# Patient Record
Sex: Female | Born: 1940
Health system: Southern US, Community
[De-identification: ages and names within clinical notes are randomized; demographics above are authoritative.]

## PROBLEM LIST (undated history)

## (undated) DIAGNOSIS — R42 Dizziness and giddiness: Secondary | ICD-10-CM

## (undated) DIAGNOSIS — Z6740 Type O blood, Rh positive: Secondary | ICD-10-CM

## (undated) DIAGNOSIS — Z87442 Personal history of urinary calculi: Secondary | ICD-10-CM

## (undated) DIAGNOSIS — K635 Polyp of colon: Secondary | ICD-10-CM

## (undated) DIAGNOSIS — K589 Irritable bowel syndrome without diarrhea: Secondary | ICD-10-CM

## (undated) DIAGNOSIS — S161XXA Strain of muscle, fascia and tendon at neck level, initial encounter: Secondary | ICD-10-CM

## (undated) DIAGNOSIS — Z889 Allergy status to unspecified drugs, medicaments and biological substances status: Secondary | ICD-10-CM

## (undated) DIAGNOSIS — F0781 Postconcussional syndrome: Secondary | ICD-10-CM

## (undated) DIAGNOSIS — F419 Anxiety disorder, unspecified: Secondary | ICD-10-CM

## (undated) DIAGNOSIS — M797 Fibromyalgia: Secondary | ICD-10-CM

## (undated) DIAGNOSIS — K219 Gastro-esophageal reflux disease without esophagitis: Secondary | ICD-10-CM

## (undated) DIAGNOSIS — M1990 Unspecified osteoarthritis, unspecified site: Secondary | ICD-10-CM

## (undated) DIAGNOSIS — I8500 Esophageal varices without bleeding: Secondary | ICD-10-CM

## (undated) DIAGNOSIS — E7800 Pure hypercholesterolemia, unspecified: Secondary | ICD-10-CM

## (undated) DIAGNOSIS — I10 Essential (primary) hypertension: Secondary | ICD-10-CM

## (undated) DIAGNOSIS — K5730 Diverticulosis of large intestine without perforation or abscess without bleeding: Secondary | ICD-10-CM

## (undated) DIAGNOSIS — K7460 Unspecified cirrhosis of liver: Secondary | ICD-10-CM

## (undated) HISTORY — DX: Diverticulosis of large intestine without perforation or abscess without bleeding: K57.30

## (undated) HISTORY — DX: Pure hypercholesterolemia, unspecified: E78.00

## (undated) HISTORY — DX: Type O blood, Rh positive: Z67.40

## (undated) HISTORY — DX: Essential (primary) hypertension: I10

## (undated) HISTORY — PX: KNEE SURGERY: SHX244

## (undated) HISTORY — PX: THYROID SURGERY: SHX805

## (undated) HISTORY — DX: Polyp of colon: K63.5

## (undated) HISTORY — DX: Fibromyalgia: M79.7

## (undated) HISTORY — DX: Gastro-esophageal reflux disease without esophagitis: K21.9

## (undated) HISTORY — DX: Irritable bowel syndrome, unspecified: K58.9

## (undated) HISTORY — DX: Unspecified cirrhosis of liver: K74.60

## (undated) HISTORY — DX: Unspecified osteoarthritis, unspecified site: M19.90

## (undated) HISTORY — DX: Esophageal varices without bleeding: I85.00

## (undated) HISTORY — DX: Dizziness and giddiness: R42

## (undated) HISTORY — DX: Allergy status to unspecified drugs, medicaments and biological substances: Z88.9

## (undated) HISTORY — DX: Personal history of urinary calculi: Z87.442

## (undated) HISTORY — DX: Postconcussional syndrome: F07.81

## (undated) HISTORY — DX: Anxiety disorder, unspecified: F41.9

## (undated) HISTORY — DX: Strain of muscle, fascia and tendon at neck level, initial encounter: S16.1XXA

---

## 1998-01-25 ENCOUNTER — Ambulatory Visit (HOSPITAL_COMMUNITY): Admission: RE | Admit: 1998-01-25 | Discharge: 1998-01-25 | Payer: Self-pay | Admitting: Pulmonary Disease

## 1998-01-25 ENCOUNTER — Encounter: Payer: Self-pay | Admitting: Pulmonary Disease

## 1998-02-03 ENCOUNTER — Encounter: Payer: Self-pay | Admitting: Orthopedic Surgery

## 1998-02-03 ENCOUNTER — Ambulatory Visit (HOSPITAL_COMMUNITY): Admission: RE | Admit: 1998-02-03 | Discharge: 1998-02-03 | Payer: Self-pay | Admitting: Orthopedic Surgery

## 1998-05-15 ENCOUNTER — Encounter: Admission: RE | Admit: 1998-05-15 | Discharge: 1998-08-13 | Payer: Self-pay | Admitting: Orthopedic Surgery

## 1999-03-03 ENCOUNTER — Emergency Department (HOSPITAL_COMMUNITY): Admission: EM | Admit: 1999-03-03 | Discharge: 1999-03-03 | Payer: Self-pay | Admitting: Emergency Medicine

## 1999-04-20 ENCOUNTER — Ambulatory Visit (HOSPITAL_COMMUNITY): Admission: RE | Admit: 1999-04-20 | Discharge: 1999-04-20 | Payer: Self-pay | Admitting: Gastroenterology

## 1999-04-20 ENCOUNTER — Encounter (INDEPENDENT_AMBULATORY_CARE_PROVIDER_SITE_OTHER): Payer: Self-pay | Admitting: *Deleted

## 1999-04-20 ENCOUNTER — Encounter: Payer: Self-pay | Admitting: Gastroenterology

## 2000-01-10 ENCOUNTER — Emergency Department (HOSPITAL_COMMUNITY): Admission: EM | Admit: 2000-01-10 | Discharge: 2000-01-10 | Payer: Self-pay | Admitting: Emergency Medicine

## 2000-01-10 ENCOUNTER — Encounter: Payer: Self-pay | Admitting: Emergency Medicine

## 2000-03-06 ENCOUNTER — Encounter: Admission: RE | Admit: 2000-03-06 | Discharge: 2000-06-04 | Payer: Self-pay | Admitting: Anesthesiology

## 2000-03-11 ENCOUNTER — Encounter: Payer: Self-pay | Admitting: Pulmonary Disease

## 2000-03-11 ENCOUNTER — Ambulatory Visit (HOSPITAL_COMMUNITY): Admission: RE | Admit: 2000-03-11 | Discharge: 2000-03-11 | Payer: Self-pay | Admitting: Pulmonary Disease

## 2001-05-10 ENCOUNTER — Emergency Department (HOSPITAL_COMMUNITY): Admission: EM | Admit: 2001-05-10 | Discharge: 2001-05-10 | Payer: Self-pay

## 2001-12-08 ENCOUNTER — Encounter: Payer: Self-pay | Admitting: Obstetrics and Gynecology

## 2001-12-08 ENCOUNTER — Ambulatory Visit (HOSPITAL_COMMUNITY): Admission: RE | Admit: 2001-12-08 | Discharge: 2001-12-08 | Payer: Self-pay | Admitting: Obstetrics and Gynecology

## 2002-05-13 HISTORY — PX: CHOLECYSTECTOMY, LAPAROSCOPIC: SHX56

## 2002-06-03 ENCOUNTER — Encounter: Admission: RE | Admit: 2002-06-03 | Discharge: 2002-06-03 | Payer: Self-pay | Admitting: Rheumatology

## 2002-10-02 ENCOUNTER — Emergency Department (HOSPITAL_COMMUNITY): Admission: EM | Admit: 2002-10-02 | Discharge: 2002-10-02 | Payer: Self-pay | Admitting: Emergency Medicine

## 2002-12-26 ENCOUNTER — Emergency Department (HOSPITAL_COMMUNITY): Admission: EM | Admit: 2002-12-26 | Discharge: 2002-12-26 | Payer: Self-pay | Admitting: Emergency Medicine

## 2003-05-02 ENCOUNTER — Inpatient Hospital Stay (HOSPITAL_COMMUNITY): Admission: EM | Admit: 2003-05-02 | Discharge: 2003-05-06 | Payer: Self-pay | Admitting: Emergency Medicine

## 2003-05-04 ENCOUNTER — Encounter: Payer: Self-pay | Admitting: Gastroenterology

## 2003-05-05 ENCOUNTER — Encounter (INDEPENDENT_AMBULATORY_CARE_PROVIDER_SITE_OTHER): Payer: Self-pay | Admitting: *Deleted

## 2003-05-05 ENCOUNTER — Encounter (INDEPENDENT_AMBULATORY_CARE_PROVIDER_SITE_OTHER): Payer: Self-pay | Admitting: Specialist

## 2003-07-16 ENCOUNTER — Emergency Department (HOSPITAL_COMMUNITY): Admission: EM | Admit: 2003-07-16 | Discharge: 2003-07-16 | Payer: Self-pay | Admitting: Emergency Medicine

## 2003-08-10 ENCOUNTER — Ambulatory Visit (HOSPITAL_COMMUNITY): Admission: RE | Admit: 2003-08-10 | Discharge: 2003-08-10 | Payer: Self-pay | Admitting: Obstetrics and Gynecology

## 2003-08-15 ENCOUNTER — Encounter: Admission: RE | Admit: 2003-08-15 | Discharge: 2003-08-15 | Payer: Self-pay | Admitting: Obstetrics and Gynecology

## 2004-03-20 ENCOUNTER — Ambulatory Visit: Payer: Self-pay | Admitting: Pulmonary Disease

## 2004-03-29 ENCOUNTER — Ambulatory Visit: Payer: Self-pay | Admitting: Pulmonary Disease

## 2004-04-15 ENCOUNTER — Emergency Department (HOSPITAL_COMMUNITY): Admission: EM | Admit: 2004-04-15 | Discharge: 2004-04-15 | Payer: Self-pay | Admitting: Family Medicine

## 2004-07-06 ENCOUNTER — Ambulatory Visit: Payer: Self-pay | Admitting: Pulmonary Disease

## 2004-08-07 ENCOUNTER — Ambulatory Visit: Payer: Self-pay | Admitting: Pulmonary Disease

## 2004-08-14 ENCOUNTER — Ambulatory Visit (HOSPITAL_COMMUNITY): Admission: RE | Admit: 2004-08-14 | Discharge: 2004-08-14 | Payer: Self-pay | Admitting: Pulmonary Disease

## 2004-11-16 ENCOUNTER — Ambulatory Visit: Payer: Self-pay | Admitting: Pulmonary Disease

## 2005-01-03 ENCOUNTER — Emergency Department (HOSPITAL_COMMUNITY): Admission: EM | Admit: 2005-01-03 | Discharge: 2005-01-03 | Payer: Self-pay | Admitting: Family Medicine

## 2005-01-17 ENCOUNTER — Ambulatory Visit: Payer: Self-pay | Admitting: Gastroenterology

## 2005-01-21 ENCOUNTER — Ambulatory Visit: Payer: Self-pay | Admitting: Gastroenterology

## 2005-01-30 ENCOUNTER — Ambulatory Visit: Payer: Self-pay | Admitting: Gastroenterology

## 2005-01-30 ENCOUNTER — Encounter (INDEPENDENT_AMBULATORY_CARE_PROVIDER_SITE_OTHER): Payer: Self-pay | Admitting: *Deleted

## 2005-02-06 ENCOUNTER — Ambulatory Visit: Payer: Self-pay | Admitting: Gastroenterology

## 2005-02-20 ENCOUNTER — Ambulatory Visit: Payer: Self-pay | Admitting: Gastroenterology

## 2005-02-22 ENCOUNTER — Ambulatory Visit: Payer: Self-pay | Admitting: Internal Medicine

## 2005-03-11 ENCOUNTER — Ambulatory Visit: Payer: Self-pay | Admitting: Gastroenterology

## 2005-07-03 ENCOUNTER — Ambulatory Visit: Payer: Self-pay | Admitting: Pulmonary Disease

## 2005-08-07 ENCOUNTER — Ambulatory Visit: Payer: Self-pay | Admitting: Pulmonary Disease

## 2005-08-22 ENCOUNTER — Encounter: Admission: RE | Admit: 2005-08-22 | Discharge: 2005-08-22 | Payer: Self-pay | Admitting: Pulmonary Disease

## 2005-08-27 ENCOUNTER — Encounter: Admission: RE | Admit: 2005-08-27 | Discharge: 2005-08-27 | Payer: Self-pay | Admitting: Pulmonary Disease

## 2006-03-11 ENCOUNTER — Ambulatory Visit: Payer: Self-pay | Admitting: Pulmonary Disease

## 2006-03-18 ENCOUNTER — Ambulatory Visit: Payer: Self-pay | Admitting: Gastroenterology

## 2006-05-19 ENCOUNTER — Ambulatory Visit: Payer: Self-pay | Admitting: Pulmonary Disease

## 2006-06-02 ENCOUNTER — Ambulatory Visit: Payer: Self-pay | Admitting: Pulmonary Disease

## 2006-06-02 LAB — CONVERTED CEMR LAB
Cholesterol: 220 mg/dL (ref 0–200)
Direct LDL: 122 mg/dL
HDL: 43.4 mg/dL (ref >39.0)
Total CHOL/HDL Ratio: 5.1
Triglycerides: 279 mg/dL (ref 0–149)
VLDL: 56 mg/dL — ABNORMAL HIGH (ref 0–40)

## 2006-07-04 ENCOUNTER — Ambulatory Visit: Payer: Self-pay | Admitting: Pulmonary Disease

## 2006-07-24 ENCOUNTER — Ambulatory Visit: Payer: Self-pay | Admitting: Pulmonary Disease

## 2006-07-24 LAB — CONVERTED CEMR LAB: TSH: 1.53 microintl units/mL (ref 0.35–5.50)

## 2006-09-16 ENCOUNTER — Ambulatory Visit (HOSPITAL_COMMUNITY): Admission: RE | Admit: 2006-09-16 | Discharge: 2006-09-16 | Payer: Self-pay | Admitting: Pulmonary Disease

## 2007-01-08 ENCOUNTER — Ambulatory Visit: Payer: Self-pay | Admitting: Pulmonary Disease

## 2007-04-21 ENCOUNTER — Emergency Department (HOSPITAL_COMMUNITY): Admission: EM | Admit: 2007-04-21 | Discharge: 2007-04-21 | Payer: Self-pay | Admitting: Emergency Medicine

## 2007-04-24 ENCOUNTER — Telehealth (INDEPENDENT_AMBULATORY_CARE_PROVIDER_SITE_OTHER): Payer: Self-pay | Admitting: *Deleted

## 2007-04-28 ENCOUNTER — Ambulatory Visit: Payer: Self-pay | Admitting: Pulmonary Disease

## 2007-04-28 DIAGNOSIS — R42 Dizziness and giddiness: Secondary | ICD-10-CM | POA: Insufficient documentation

## 2007-04-29 ENCOUNTER — Ambulatory Visit: Payer: Self-pay | Admitting: Cardiovascular Disease

## 2007-04-30 ENCOUNTER — Telehealth: Payer: Self-pay | Admitting: Adult Health

## 2007-04-30 LAB — CONVERTED CEMR LAB
BUN: 23 mg/dL (ref 6–23)
Basophils Absolute: 0 10*3/uL (ref 0.0–0.1)
Basophils Relative: 0.4 % (ref 0.0–1.0)
CO2: 30 meq/L (ref 19–32)
Calcium: 9.4 mg/dL (ref 8.4–10.5)
Chloride: 104 meq/L (ref 96–112)
Creatinine, Ser: 1 mg/dL (ref 0.4–1.2)
Eosinophils Absolute: 0.1 10*3/uL (ref 0.0–0.6)
Eosinophils Relative: 1.8 % (ref 0.0–5.0)
GFR calc Af Amer: 71 mL/min
GFR calc non Af Amer: 59 mL/min
Glucose, Bld: 115 mg/dL — ABNORMAL HIGH (ref 70–99)
HCT: 41.8 % (ref 36.0–46.0)
Hemoglobin: 14.4 g/dL (ref 12.0–15.0)
Lymphocytes Relative: 44.8 % (ref 12.0–46.0)
MCHC: 34.4 g/dL (ref 30.0–36.0)
MCV: 85.9 fL (ref 78.0–100.0)
Monocytes Absolute: 0.7 10*3/uL (ref 0.2–0.7)
Monocytes Relative: 8 % (ref 3.0–11.0)
Neutro Abs: 3.7 10*3/uL (ref 1.4–7.7)
Neutrophils Relative %: 45 % (ref 43.0–77.0)
Platelets: 321 10*3/uL (ref 150–400)
Potassium: 4.5 meq/L (ref 3.5–5.1)
RBC: 4.86 M/uL (ref 3.87–5.11)
RDW: 12.1 % (ref 11.5–14.6)
Sodium: 141 meq/L (ref 135–145)
TSH: 1.28 microintl units/mL (ref 0.35–5.50)
WBC: 8.2 10*3/uL (ref 4.5–10.5)

## 2007-05-04 DIAGNOSIS — F411 Generalized anxiety disorder: Secondary | ICD-10-CM | POA: Insufficient documentation

## 2007-05-04 DIAGNOSIS — M1990 Unspecified osteoarthritis, unspecified site: Secondary | ICD-10-CM | POA: Insufficient documentation

## 2007-05-04 DIAGNOSIS — I10 Essential (primary) hypertension: Secondary | ICD-10-CM | POA: Insufficient documentation

## 2007-05-04 DIAGNOSIS — J45909 Unspecified asthma, uncomplicated: Secondary | ICD-10-CM | POA: Insufficient documentation

## 2007-05-05 ENCOUNTER — Ambulatory Visit: Payer: Self-pay | Admitting: Pulmonary Disease

## 2007-05-05 DIAGNOSIS — S139XXA Sprain of joints and ligaments of unspecified parts of neck, initial encounter: Secondary | ICD-10-CM | POA: Insufficient documentation

## 2007-05-28 ENCOUNTER — Telehealth: Payer: Self-pay | Admitting: Pulmonary Disease

## 2007-06-10 ENCOUNTER — Ambulatory Visit: Payer: Self-pay | Admitting: Pulmonary Disease

## 2007-06-13 DIAGNOSIS — K589 Irritable bowel syndrome without diarrhea: Secondary | ICD-10-CM | POA: Insufficient documentation

## 2007-06-13 DIAGNOSIS — J3089 Other allergic rhinitis: Secondary | ICD-10-CM

## 2007-06-13 DIAGNOSIS — Z889 Allergy status to unspecified drugs, medicaments and biological substances status: Secondary | ICD-10-CM | POA: Insufficient documentation

## 2007-06-13 DIAGNOSIS — J302 Other seasonal allergic rhinitis: Secondary | ICD-10-CM | POA: Insufficient documentation

## 2007-06-13 DIAGNOSIS — K219 Gastro-esophageal reflux disease without esophagitis: Secondary | ICD-10-CM | POA: Insufficient documentation

## 2007-06-13 DIAGNOSIS — IMO0001 Reserved for inherently not codable concepts without codable children: Secondary | ICD-10-CM | POA: Insufficient documentation

## 2007-07-06 ENCOUNTER — Encounter: Payer: Self-pay | Admitting: Pulmonary Disease

## 2007-07-10 ENCOUNTER — Ambulatory Visit: Payer: Self-pay | Admitting: Pulmonary Disease

## 2007-07-10 DIAGNOSIS — F0781 Postconcussional syndrome: Secondary | ICD-10-CM | POA: Insufficient documentation

## 2007-07-15 ENCOUNTER — Telehealth (INDEPENDENT_AMBULATORY_CARE_PROVIDER_SITE_OTHER): Payer: Self-pay | Admitting: *Deleted

## 2007-08-25 ENCOUNTER — Telehealth (INDEPENDENT_AMBULATORY_CARE_PROVIDER_SITE_OTHER): Payer: Self-pay | Admitting: *Deleted

## 2007-09-16 ENCOUNTER — Ambulatory Visit (HOSPITAL_COMMUNITY): Admission: RE | Admit: 2007-09-16 | Discharge: 2007-09-16 | Payer: Self-pay | Admitting: Pulmonary Disease

## 2007-10-22 ENCOUNTER — Ambulatory Visit: Payer: Self-pay | Admitting: Pulmonary Disease

## 2007-10-22 DIAGNOSIS — K5730 Diverticulosis of large intestine without perforation or abscess without bleeding: Secondary | ICD-10-CM | POA: Insufficient documentation

## 2007-10-22 DIAGNOSIS — D126 Benign neoplasm of colon, unspecified: Secondary | ICD-10-CM | POA: Insufficient documentation

## 2007-10-30 ENCOUNTER — Ambulatory Visit: Payer: Self-pay | Admitting: Pulmonary Disease

## 2007-10-30 DIAGNOSIS — J069 Acute upper respiratory infection, unspecified: Secondary | ICD-10-CM | POA: Insufficient documentation

## 2007-11-20 ENCOUNTER — Telehealth: Payer: Self-pay | Admitting: Pulmonary Disease

## 2007-12-28 ENCOUNTER — Telehealth (INDEPENDENT_AMBULATORY_CARE_PROVIDER_SITE_OTHER): Payer: Self-pay | Admitting: *Deleted

## 2007-12-29 ENCOUNTER — Ambulatory Visit: Payer: Self-pay | Admitting: Internal Medicine

## 2008-01-28 ENCOUNTER — Telehealth (INDEPENDENT_AMBULATORY_CARE_PROVIDER_SITE_OTHER): Payer: Self-pay | Admitting: *Deleted

## 2008-02-03 ENCOUNTER — Telehealth (INDEPENDENT_AMBULATORY_CARE_PROVIDER_SITE_OTHER): Payer: Self-pay | Admitting: *Deleted

## 2008-03-18 ENCOUNTER — Telehealth (INDEPENDENT_AMBULATORY_CARE_PROVIDER_SITE_OTHER): Payer: Self-pay | Admitting: *Deleted

## 2008-04-19 ENCOUNTER — Ambulatory Visit: Payer: Self-pay | Admitting: Pulmonary Disease

## 2008-05-13 DIAGNOSIS — K635 Polyp of colon: Secondary | ICD-10-CM

## 2008-05-13 HISTORY — DX: Polyp of colon: K63.5

## 2008-07-11 ENCOUNTER — Ambulatory Visit: Payer: Self-pay | Admitting: Gastroenterology

## 2008-07-11 DIAGNOSIS — R197 Diarrhea, unspecified: Secondary | ICD-10-CM | POA: Insufficient documentation

## 2008-07-18 ENCOUNTER — Telehealth: Payer: Self-pay | Admitting: Gastroenterology

## 2008-07-18 ENCOUNTER — Encounter: Payer: Self-pay | Admitting: Gastroenterology

## 2008-07-25 ENCOUNTER — Telehealth: Payer: Self-pay | Admitting: Gastroenterology

## 2008-07-28 ENCOUNTER — Encounter: Payer: Self-pay | Admitting: Pulmonary Disease

## 2008-07-28 ENCOUNTER — Encounter: Payer: Self-pay | Admitting: Gastroenterology

## 2008-07-28 ENCOUNTER — Ambulatory Visit: Payer: Self-pay | Admitting: Gastroenterology

## 2008-07-29 ENCOUNTER — Telehealth: Payer: Self-pay | Admitting: Gastroenterology

## 2008-08-02 ENCOUNTER — Encounter: Payer: Self-pay | Admitting: Gastroenterology

## 2008-08-16 ENCOUNTER — Ambulatory Visit: Payer: Self-pay | Admitting: Gastroenterology

## 2008-08-24 ENCOUNTER — Emergency Department (HOSPITAL_COMMUNITY): Admission: EM | Admit: 2008-08-24 | Discharge: 2008-08-25 | Payer: Self-pay | Admitting: Emergency Medicine

## 2008-08-25 ENCOUNTER — Emergency Department (HOSPITAL_COMMUNITY): Admission: EM | Admit: 2008-08-25 | Discharge: 2008-08-25 | Payer: Self-pay | Admitting: Emergency Medicine

## 2008-09-16 ENCOUNTER — Encounter: Admission: RE | Admit: 2008-09-16 | Discharge: 2008-09-16 | Payer: Self-pay | Admitting: Pulmonary Disease

## 2008-10-25 ENCOUNTER — Ambulatory Visit: Payer: Self-pay | Admitting: Pulmonary Disease

## 2008-10-30 DIAGNOSIS — E559 Vitamin D deficiency, unspecified: Secondary | ICD-10-CM | POA: Insufficient documentation

## 2008-10-30 LAB — CONVERTED CEMR LAB
ALT: 22 units/L (ref 0–35)
AST: 28 units/L (ref 0–37)
Albumin: 4.1 g/dL (ref 3.5–5.2)
Alkaline Phosphatase: 85 units/L (ref 39–117)
BUN: 23 mg/dL (ref 6–23)
Basophils Absolute: 0.2 10*3/uL — ABNORMAL HIGH (ref 0.0–0.1)
Basophils Relative: 2.4 % (ref 0.0–3.0)
Bilirubin, Direct: 0.1 mg/dL (ref 0.0–0.3)
CO2: 29 meq/L (ref 19–32)
Calcium: 8.8 mg/dL (ref 8.4–10.5)
Chloride: 112 meq/L (ref 96–112)
Cholesterol: 204 mg/dL — ABNORMAL HIGH (ref 0–200)
Creatinine, Ser: 0.8 mg/dL (ref 0.4–1.2)
Direct LDL: 123.3 mg/dL
Eosinophils Absolute: 0.1 10*3/uL (ref 0.0–0.7)
Eosinophils Relative: 1.8 % (ref 0.0–5.0)
GFR calc non Af Amer: 75.34 mL/min (ref >60)
Glucose, Bld: 102 mg/dL — ABNORMAL HIGH (ref 70–99)
HCT: 42.3 % (ref 36.0–46.0)
HDL: 42.8 mg/dL (ref >39.00)
Hemoglobin: 14.7 g/dL (ref 12.0–15.0)
Lymphocytes Relative: 46.6 % — ABNORMAL HIGH (ref 12.0–46.0)
Lymphs Abs: 3.3 10*3/uL (ref 0.7–4.0)
MCHC: 34.7 g/dL (ref 30.0–36.0)
MCV: 85 fL (ref 78.0–100.0)
Monocytes Absolute: 0.6 10*3/uL (ref 0.1–1.0)
Monocytes Relative: 8.6 % (ref 3.0–12.0)
Neutro Abs: 2.9 10*3/uL (ref 1.4–7.7)
Neutrophils Relative %: 40.6 % — ABNORMAL LOW (ref 43.0–77.0)
Platelets: 271 10*3/uL (ref 150.0–400.0)
Potassium: 4.1 meq/L (ref 3.5–5.1)
RBC: 4.98 M/uL (ref 3.87–5.11)
RDW: 12.2 % (ref 11.5–14.6)
Sodium: 141 meq/L (ref 135–145)
TSH: 1.68 microintl units/mL (ref 0.35–5.50)
Total Bilirubin: 0.9 mg/dL (ref 0.3–1.2)
Total CHOL/HDL Ratio: 5
Total Protein: 7.2 g/dL (ref 6.0–8.3)
Triglycerides: 167 mg/dL — ABNORMAL HIGH (ref 0.0–149.0)
Uric Acid, Serum: 5.8 mg/dL (ref 2.4–7.0)
VLDL: 33.4 mg/dL (ref 0.0–40.0)
Vit D, 25-Hydroxy: 21 ng/mL — ABNORMAL LOW (ref 30–89)
WBC: 7.1 10*3/uL (ref 4.5–10.5)

## 2009-01-11 ENCOUNTER — Ambulatory Visit: Payer: Self-pay | Admitting: Internal Medicine

## 2009-02-27 ENCOUNTER — Telehealth (INDEPENDENT_AMBULATORY_CARE_PROVIDER_SITE_OTHER): Payer: Self-pay | Admitting: *Deleted

## 2009-03-10 ENCOUNTER — Telehealth (INDEPENDENT_AMBULATORY_CARE_PROVIDER_SITE_OTHER): Payer: Self-pay | Admitting: *Deleted

## 2009-04-04 ENCOUNTER — Emergency Department (HOSPITAL_COMMUNITY): Admission: EM | Admit: 2009-04-04 | Discharge: 2009-04-04 | Payer: Self-pay | Admitting: Emergency Medicine

## 2009-04-25 ENCOUNTER — Ambulatory Visit: Payer: Self-pay | Admitting: Pulmonary Disease

## 2009-04-26 DIAGNOSIS — N200 Calculus of kidney: Secondary | ICD-10-CM | POA: Insufficient documentation

## 2009-07-17 ENCOUNTER — Telehealth (INDEPENDENT_AMBULATORY_CARE_PROVIDER_SITE_OTHER): Payer: Self-pay | Admitting: *Deleted

## 2009-07-19 ENCOUNTER — Ambulatory Visit: Payer: Self-pay | Admitting: Pulmonary Disease

## 2009-07-23 LAB — CONVERTED CEMR LAB
ALT: 22 units/L (ref 0–35)
AST: 25 units/L (ref 0–37)
Albumin: 4.2 g/dL (ref 3.5–5.2)
Alkaline Phosphatase: 77 units/L (ref 39–117)
BUN: 21 mg/dL (ref 6–23)
Basophils Absolute: 0.1 10*3/uL (ref 0.0–0.1)
Basophils Relative: 0.8 % (ref 0.0–3.0)
Bilirubin, Direct: 0.2 mg/dL (ref 0.0–0.3)
CO2: 28 meq/L (ref 19–32)
Calcium: 9.4 mg/dL (ref 8.4–10.5)
Chloride: 106 meq/L (ref 96–112)
Creatinine, Ser: 0.7 mg/dL (ref 0.4–1.2)
Eosinophils Absolute: 0.1 10*3/uL (ref 0.0–0.7)
Eosinophils Relative: 1.3 % (ref 0.0–5.0)
GFR calc non Af Amer: 87.71 mL/min (ref >60)
Glucose, Bld: 113 mg/dL — ABNORMAL HIGH (ref 70–99)
HCT: 43.4 % (ref 36.0–46.0)
Hemoglobin: 14.3 g/dL (ref 12.0–15.0)
Lymphocytes Relative: 42.3 % (ref 12.0–46.0)
Lymphs Abs: 3.2 10*3/uL (ref 0.7–4.0)
MCHC: 33 g/dL (ref 30.0–36.0)
MCV: 86.8 fL (ref 78.0–100.0)
Monocytes Absolute: 0.6 10*3/uL (ref 0.1–1.0)
Monocytes Relative: 7.9 % (ref 3.0–12.0)
Neutro Abs: 3.6 10*3/uL (ref 1.4–7.7)
Neutrophils Relative %: 47.7 % (ref 43.0–77.0)
Platelets: 320 10*3/uL (ref 150.0–400.0)
Potassium: 4 meq/L (ref 3.5–5.1)
RBC: 5 M/uL (ref 3.87–5.11)
RDW: 12.1 % (ref 11.5–14.6)
Sed Rate: 14 mm/hr (ref 0–22)
Sodium: 141 meq/L (ref 135–145)
TSH: 1.91 microintl units/mL (ref 0.35–5.50)
Total Bilirubin: 1 mg/dL (ref 0.3–1.2)
Total Protein: 7.6 g/dL (ref 6.0–8.3)
Vit D, 25-Hydroxy: 35 ng/mL (ref 30–89)
WBC: 7.6 10*3/uL (ref 4.5–10.5)

## 2009-09-29 ENCOUNTER — Encounter: Admission: RE | Admit: 2009-09-29 | Discharge: 2009-09-29 | Payer: Self-pay | Admitting: Pulmonary Disease

## 2009-10-24 ENCOUNTER — Ambulatory Visit: Payer: Self-pay | Admitting: Pulmonary Disease

## 2009-10-25 LAB — CONVERTED CEMR LAB
Cholesterol: 221 mg/dL — ABNORMAL HIGH (ref 0–200)
Direct LDL: 115.7 mg/dL
HDL: 40.3 mg/dL (ref >39.00)
Total CHOL/HDL Ratio: 5
Triglycerides: 385 mg/dL — ABNORMAL HIGH (ref 0.0–149.0)
VLDL: 77 mg/dL — ABNORMAL HIGH (ref 0.0–40.0)

## 2010-01-10 ENCOUNTER — Emergency Department (HOSPITAL_COMMUNITY): Admission: EM | Admit: 2010-01-10 | Discharge: 2010-01-10 | Payer: Self-pay | Admitting: Family Medicine

## 2010-01-10 ENCOUNTER — Telehealth (INDEPENDENT_AMBULATORY_CARE_PROVIDER_SITE_OTHER): Payer: Self-pay | Admitting: *Deleted

## 2010-01-23 ENCOUNTER — Telehealth: Payer: Self-pay | Admitting: Pulmonary Disease

## 2010-01-23 ENCOUNTER — Telehealth (INDEPENDENT_AMBULATORY_CARE_PROVIDER_SITE_OTHER): Payer: Self-pay | Admitting: *Deleted

## 2010-02-01 ENCOUNTER — Telehealth (INDEPENDENT_AMBULATORY_CARE_PROVIDER_SITE_OTHER): Payer: Self-pay | Admitting: *Deleted

## 2010-02-06 ENCOUNTER — Telehealth: Payer: Self-pay | Admitting: Pulmonary Disease

## 2010-02-07 ENCOUNTER — Telehealth: Payer: Self-pay | Admitting: Pulmonary Disease

## 2010-02-07 ENCOUNTER — Encounter: Payer: Self-pay | Admitting: Pulmonary Disease

## 2010-02-12 ENCOUNTER — Ambulatory Visit: Payer: Self-pay | Admitting: Pulmonary Disease

## 2010-02-16 ENCOUNTER — Telehealth: Payer: Self-pay | Admitting: Pulmonary Disease

## 2010-03-19 ENCOUNTER — Telehealth (INDEPENDENT_AMBULATORY_CARE_PROVIDER_SITE_OTHER): Payer: Self-pay | Admitting: *Deleted

## 2010-03-26 ENCOUNTER — Telehealth: Payer: Self-pay | Admitting: Pulmonary Disease

## 2010-04-24 ENCOUNTER — Ambulatory Visit: Payer: Self-pay | Admitting: Pulmonary Disease

## 2010-04-24 DIAGNOSIS — R079 Chest pain, unspecified: Secondary | ICD-10-CM | POA: Insufficient documentation

## 2010-06-03 ENCOUNTER — Encounter: Payer: Self-pay | Admitting: Obstetrics and Gynecology

## 2010-06-12 NOTE — Progress Notes (Signed)
Summary: medicinie question/cb  Phone Note Call from Patient Call back at Home Phone [phone]   Caller: Patient Call For: nadel Summary of Call: calling with more info on medicinie assistance pt got approval letter but says needs more info Initial call taken by: Don Broach,  March 26, 2010 12:07 PM  Follow-up for Phone Call        spoke with pt and she states she received an approval for [place] assistance but she now needs a prescription to be faxed to [phone] for Advair 250/50. I have printed rx to have Dr. [name] sign and I will fax once signed. [place] Bing CMA  March 26, 2010 12:29 PM   Additional Follow-up for Phone Call Additional follow up Details #1::        RX HAS BEEN SIGNED AND FAXED TO [place]. Elita Boone CMA  March 26, 2010 2:01 PM     Prescriptions: ADVAIR DISKUS 250-50 MCG/DOSE MISC (FLUTICASONE-SALMETEROL) Inhale 1 puff two times a day  #180 x 3   Entered by:   Malcolm Bing CMA   Authorized by:   Noralee Space MD   Signed by:   [place] Bing CMA on 03/26/2010   Method used:   Print then Mail to Patient   RxID:   [id number]

## 2010-06-12 NOTE — Progress Notes (Signed)
Summary: appt  Phone Note Call from Patient Call back at Home Phone [phone] Call back at [phone] cell   Caller: Patient Call For: nadel Reason for Call: Talk to Nurse Summary of Call: ? reflux induced asthma, fatigue has set in, not sure what all is going on.  No energy.  Want to see SN or Tammy Initial call taken by: Zigmund Gottron,  July 17, 2009 8:19 AM  Follow-up for Phone Call        PT c/o increased sob, chest heaviness for 3 weeks,  Thinks it may be r/t reflux.  Taking Nexium once daily .  Offered to work pt in with another provider but pt wants to wait to see Dr [name] on 07-19-09.  Pt instructed that if symptoms worsen to call our office for sooner appt. Doroteo Glassman RN  July 17, 2009 9:23 AM

## 2010-06-12 NOTE — Progress Notes (Signed)
Summary: switch Nexium to Omeprazole/ pt assistance forms for Advair  Phone Note Call from Patient Call back at Home Phone [phone]   Caller: Patient Call For: [name] Summary of Call: Pt states she needs a substitute for nexium, re: too expensive, also needs written rxs for sdvair diskus 250-50 micrograms, re: she needs this for medication assistance program, call when ready will pick up. Initial call taken by: Netta Neat,  February 01, 2010 2:07 PM  Follow-up for Phone Call        called and spoke with pt.  pt states she is now in the "donut hole" and Nexium is too expensive for her.  Pt states the medicine "works great" but cannot afford it.  Wanted SN's recs if there is something cheaper she can take for the remainder of the year and then switch back to Nexium next year when she is out of the donut hole.  Please advise.  Also, pt states Advair is costing her $116 per month.  Pt states she has contacted [employer] regarding assistance and they are going to mail her a Patient Assistance Program application to see if she qualifies for their program.  Pt wanted to know, if in the meantime, she could get samples of Advair 250/50.  Informed pt we had samples avail of the Advair 250/50 that she is welcome to come up to the office and pick up and that I would also have a patient assistance form her her to fill out so she doesn't have to wait for the one to arrive in the mail.  Pt agreed to this.    Will forward message to SN to get his recs regarding the nexium.  Jinny Blossom Reynolds LPN  February 01, 173 3:01 PM   Allergies:  1)  ! Celexa 2)  ! * Hydrocodone/apap 3)  ! * Droperidol 4)  ! * Clonedine 5)  ! * Benicar 6)  ! * Lotrel 7)  ! * Cyclobenzaprine 8)  ! * Inflammatory Medicines 9)  ! Amoxicillin 10)  ! Zithromax 11)  ! * Metoclopramide 12)  ! * Chlorzoxazone 13)  ! Toradol 14)  ! Morphine  Additional Follow-up for Phone Call Additional follow up Details #1::        ok for her use  the omeprazole that she had prior to changing to the nexium---it just depends on what her insurance will cover and we can change to that .  thanks [place]  February 01, 2010 3:05 PM     Additional Follow-up for Phone Call Additional follow up Details #2::    called and spoke with pt.  pt aware of SN's recs and will call in rx for Omeprazole for pt to pick up.  Pt will come by the office to pick up samples of Advair and fill out patient assistant forms and so i can get a copy for her w-2/income tax paperwork that is requested by the patient assistant program.    New/Updated Medications: OMEPRAZOLE 20 MG CPDR (OMEPRAZOLE) Take 1 tablet by mouth once a day Prescriptions: OMEPRAZOLE 20 MG CPDR (OMEPRAZOLE) Take 1 tablet by mouth once a day  #30 x 5   Entered by:   Matthew Folks LPN   Authorized by:   Noralee Space MD   Signed by:   Matthew Folks LPN on 94/49/6759   Method used:   [facility] to        [facility] [address] [phone]* (retail)  [place]       [place], [place]  [address]       Ph: [phone] or [phone]       Fax: [phone]   RxID:   [id number] ADVAIR DISKUS 250-50 MCG/DOSE MISC (FLUTICASONE-SALMETEROL) Inhale 1 puff two times a day  #180 x 3   Entered by:   Matthew Folks LPN   Authorized by:   Noralee Space MD   Signed by:   Matthew Folks LPN on 58/00/6349   Method used:   Printed then faxed to ...       [facility] [address] [phone]* (retail)       [address]       [place],   [address]       Ph: [phone] or [phone]       Fax: [phone]   [place]:   [phone]  printed rx for SN to sign for Advair.  This is to be mailed off to the [place] Patient Assistance Program along with the paperwork pt filled out.Matthew Folks LPN  February 01, 4757 3:02 PM

## 2010-06-12 NOTE — Medication Information (Signed)
Summary: Application/AZ&Me  Application/AZ&Me   Imported By: Phillis Knack 03/01/2010 10:45:33  _____________________________________________________________________  External Attachment:    Type:   Image     Comment:   External Document

## 2010-06-12 NOTE — Assessment & Plan Note (Signed)
Summary: Increased asthma symptoms, chest heaviness, sob, increased fa...   Primary Care Provider:  Teressa Lower, MD  CC:  3 month ROV & add-on for asthma symptoms....  History of Present Illness: [name] here for a follow up visit... he has multiple medical problems as noted below...      ~  Jun10:  she is also followed by [facility] for GI- f/u colonoscopy done 3/10 showing another adenomatous polyp... she reports an episode of left flank pain w/ ER eval showing a kidney stone- she passed on her own... had her yearly Mammogram 5/10- neg (mother & sister both had breast cancer)... her weight is up 8# to 191# and she is committed to diet + exercise this summer...  ~  Dec10:  feeling well- no further kidney stone problems although CT 4/10 showed tiny left lower pole calculus...  asthma remains under control w/o exac; wt down 6# to 185#; GERD breakthrough on Protonix & she requests [place]; still not sleeping & intol Ambien w/ nightmares- try Lorazepam Qhs.   ~  July 19, 2009:  c/o incr asthma symptoms- chest heavy, SOB, noct cough, sneezing... she's only been doing the Advair Qam & we discussed incr to Bid + Depo80 & Dosepak... also c/o decr memory (see MMSE) but she wants to wait on meds...    Current Problem List:    she had the 2010 flu vaccine.  ALLERGIC RHINITIS (ICD-477.9) - she uses OTC Claritin, Saline and FLONASE... she really likes the "netti pot"...  ASTHMA (ICD-493.90) - stable on ADVAIR 250Bid (but really only using it in the AM to save $$)...   ~  CXR 3/11 showed hypoaeration, basilar atx, NAD...  ~  3/11:  notes recent incr cough, SOB, chest heavy, sneezing, etc... Rx w/ incr ADVAIR to Bid + Depo/ Dosepak...  HYPERTENSION (ICD-401.9) - controlled on NORVASC 60m/d... BP today = 140/80 & BP's even better at home... denies HA, visual changes, CP, palipit, syncope, dyspnea, edema, etc...  HYPERCHOLESTEROLEMIA, BORDERLINE (ICD-272.4) - on diet + exercise, doesn't want meds.  ~  FHazen 1/08 showed TChol 220, TG 279, HDL 43, LDL 122  ~  FLP 6/10 showed TChol 204, TG 167, HDL 43, LDL 123... needs better diet, get wt down.  GERD (ICD-530.81) - prev on Protonix but insurance won't pay, therefore switch to [place]... ?Omep caused diarrhea.  ~  last EGD 9/06 showed esophagitis...  Hx Gallstones -  s/p ERCP w/ sphincterotomy 12/04... and subsequent cholecystectomy...  DIVERTICULOSIS OF COLON (ICD-562.10) IBS (ICD-564.1) COLONIC POLYPS (ICD-211.3)  ~  colonoscopy 12/00 showed divertics, hems, colon polyp- adenomatous...   ~  f/u colonoscopy 3/10 by DrKaplan w/ polyp removed- adenomatous, f/u planned 5 yrs.  Hx of NEPHROLITHIASIS (ICD-592.0) - went to ER 4/10 w/ left flank pain & CT showed mild hydroneph & kidney stone... passed it on her own & followed by Urology, but we don't have notes...  OSTEOARTHRITIS (ICD-715.90) - she has some left hip discomfort... states TRAMADOL without help... uses Tylenol for pain.  FIBROMYALGIA (ICD-729.1) - takes Calcium, MVI, Vit D...  VITAMIN D DEFICIENCY (ICD-268.9)  ~  labs 6/10 showed Vit D level= 21... rec> Vit D 1000 u daily.  ~  labs 3/11 showed Vit D level= 35... continue same.  POSTCONCUSSION SYNDROME (ICD-310.2) [place] fully [place] & back to baseline. CERVICAL STRAIN, ACUTE (ICD-847.0) - "I do exercises and have a good pillow"... DIZZINESS (ICD-780.4) - uses MECLIZINE 25102mrn...  ANXIETY (ICD-300.00) - INTOL  Ambien w/ nightmares... try LORAZEPAM 44m Qhs Prn...  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   Allergies: 1)  ! Celexa 2)  ! * Hydrocodone/apap 3)  ! * Droperidol 4)  ! * Clonedine 5)  ! * Benicar 6)  ! * Lotrel 7)  ! * Cyclobenzaprine 8)  ! * Inflammatory Medicines 9)  ! Amoxicillin 10)  ! Zithromax 11)  ! * Metoclopramide 12)  ! * Chlorzoxazone 13)  ! Toradol 14)  ! Morphine  Comments:  Nurse/Medical  Assistant: The patient's medications and allergies were reviewed with the patient and were updated in the Medication and Allergy Lists.  Past History:  Past Medical History:  ALLERGIC RHINITIS (ICD-477.9) ASTHMA (ICD-493.90) HYPERTENSION (ICD-401.9) HYPERCHOLESTEROLEMIA, BORDERLINE (ICD-272.4) GERD (ICD-530.81) DIVERTICULOSIS OF COLON (ICD-562.10) IBS (ICD-564.1) COLONIC POLYPS (ICD-211.3) Hx of NEPHROLITHIASIS (ICD-592.0) OSTEOARTHRITIS (ICD-715.90) FIBROMYALGIA (ICD-729.1) VITAMIN D DEFICIENCY (ICD-268.9) POSTCONCUSSION SYNDROME (ICD-310.2) CERVICAL STRAIN, ACUTE (ICD-847.0) DIZZINESS (ICD-780.4) ANXIETY (ICD-300.00) PERSONAL HISTORY ALLERGY UNSPEC MEDICINAL AGENT (ICD-V14.9)  Past Surgical History: S/P Lap Chole for gallstone pancreatitis in 2004 Cholecystectomy Thyroid Surgery  Family History: Reviewed history from 12/29/2007 and no changes required. mother died age 5078from cancer and heart problems father died age 8042from car accident 4 siblings 1 sister alive age 809 hx of breast cancer 149yrs ago 1 sister alive age 43737 in good health 1 sister died age 43746from pancreatitis,etoh abuse 1 sibling living at 762with alcohol abuse, muscle and nerve disorder  Social History: Reviewed history from 12/29/2007 and no changes required. retired quit smoking at age [age]divorced 1 child exercises 3x a week drinks 1 cup caffeine every other day no etoh  Review of Systems      See HPI       The patient complains of dyspnea on exertion and prolonged cough.  The patient denies anorexia, fever, weight loss, weight gain, vision loss, decreased hearing, hoarseness, chest pain, syncope, peripheral edema, headaches, hemoptysis, abdominal pain, melena, hematochezia, severe indigestion/heartburn, hematuria, incontinence, muscle weakness, suspicious skin lesions, transient blindness, difficulty walking, depression, unusual weight change, abnormal bleeding, enlarged lymph nodes, and  angioedema.    Vital Signs:  Patient profile:   70year old female Height:      62 inches Weight:      190.38 pounds O2 Sat:      97 % on Room air Temp:     99.1 degrees F oral Pulse rate:   74 / minute BP sitting:   140 / 80  (right arm) Cuff size:   regular  Vitals Entered By: LElita BooneCMA (July 19, 2009 10:24 AM)  O2 Sat at Rest %:  97 O2 Flow:  Room air  CC: 3 month ROV & add-on for asthma symptoms... Is Patient Diabetic? No Pain Assessment Patient in pain? no      Comments no changes in meds today   Physical Exam  Additional Exam:  WD, WN, 70y/o WF in NAD... GENERAL:  Alert & oriented; pleasant & cooperative... HEENT:  /AT, EOM-wnl, PERRLA, EACs-clear, TMs-wnl, NOSE: sl pale, no exud; THROAT-clear & wnl. NECK:  Supple w/ fair ROM; no JVD; normal carotid impulses w/o bruits; no thyromegaly or nodules palpated; no lymphadenopathy. CHEST:  scat rhonchi & end exp wheezing, no rales or signs of consolidation... HEART:  Regular Rhythm; without murmurs/ rubs/ or gallops detected... ABDOMEN:  Soft & nontender; normal bowel sounds; no organomegaly or masses  palpated... EXT: without deformities, mild arthritic changes; no varicose veins/ venous insuffic/ or edema. BACK:  she has numerous trigger point and tender spots thru the shoulders, back, chest etc... NEURO:  CN's intact; no focal neuro deficits... DERM:  No lesions noted; no rash etc...    Impression & Recommendations:  Problem # 1:  ASTHMA (ICD-493.90) We discussed Rx w/ incr Advair to Bid regularly, + Depo80/ Dosepak... Her updated medication list for this problem includes:    Advair Diskus 250-50 Mcg/dose Misc (Fluticasone-salmeterol) ..... Inhale 1 puff two times a day    Methylprednisolone (pak) 4 Mg Tabs (Methylprednisolone) .Marland Kitchen... Take as directed til gone...  Orders: T-2 View CXR (48016PV)  Problem # 2:  HYPERTENSION (ICD-401.9) Controlled on meds... Her updated medication list for this problem  includes:    Amlodipine Besylate 5 Mg Tabs (Amlodipine besylate) .Marland Kitchen... Take 1 tablet by mouth once a day  Orders: T-Vitamin D (25-Hydroxy) (37482-70786) TLB-BMP (Basic Metabolic Panel-BMET) (75449-EEFEOFH) TLB-Hepatic/Liver Function Pnl (80076-HEPATIC) TLB-CBC Platelet - w/Differential (85025-CBCD) TLB-TSH (Thyroid Stimulating Hormone) (84443-TSH) TLB-Sedimentation Rate (ESR) (85652-ESR) T-2 View CXR (71020TC)  Problem # 3:  HYPERCHOLESTEROLEMIA, BORDERLINE (ICD-272.4) She refuses med Rx- prefers just diet alone...  Problem # 4:  GERD (ICD-530.81) GI is stable... Her updated medication list for this problem includes:    Nexium 40 Mg Cpdr (Esomeprazole magnesium) .Marland Kitchen... Take 1 cap by mouth two times a day- 30 min before breakfast & dinner  Problem # 5:  OSTEOARTHRITIS (ICD-715.90) Stable Ortho- DJD, FM, vit D...  Problem # 6:  ANXIETY (ICD-300.00) Notes incr stress w/ elderly neighbors relying on her... Her updated medication list for this problem includes:    Lorazepam 1 Mg Tabs (Lorazepam) .Marland Kitchen... Take 1 tab by mouth at bedtime for sleep...  Problem # 7:  OTHER MEDICAL PROBLEMS AS NOTED>>>  Complete Medication List: 1)  Fluticasone Propionate 50 Mcg/act Susp (Fluticasone propionate) .Marland Kitchen.. 1 spray in each nostril  every morning and every evening 2)  [facility] Loratadine 10 Mg Tabs (Loratadine) .... Take 1 tablet by mouth once a day 3)  [facility] Saline Nasal Spray 0.65 % Soln (Saline) .... As needed 4)  Mucinex 600 Mg Tb12 (Guaifenesin) .Marland Kitchen.. 1-2 tabs by mouth two times a day w/ fluids.Marland KitchenMarland Kitchen 5)  Advair Diskus 250-50 Mcg/dose Misc (Fluticasone-salmeterol) .... Inhale 1 puff two times a day 6)  Amlodipine Besylate 5 Mg Tabs (Amlodipine besylate) .... Take 1 tablet by mouth once a day 7)  Nexium 40 Mg Cpdr (Esomeprazole magnesium) .... Take 1 cap by mouth two times a day- 30 min before breakfast & dinner 8)  Centrum Silver Tabs (Multiple vitamins-minerals) .... Take 1 tablet by mouth once a day 9)   Meclizine Hcl 25 Mg Tabs (Meclizine hcl) .... Take 1 tab by mouth every 6 h as needed for dizziness. 10)  Lorazepam 1 Mg Tabs (Lorazepam) .... Take 1 tab by mouth at bedtime for sleep... 11)  Methylprednisolone (pak) 4 Mg Tabs (Methylprednisolone) .... Take as directed til gone...  Other Orders: Prescription Created Electronically [id number])  Patient Instructions: 1)  Today we updated your med list- see below.... 2)  Reminder to use your ADVAIR twice daily.Marland KitchenMarland Kitchen 3)  Increase your NEXIUM to twice daily- 30 min before breakfast & dinner... 4)  We also wrote a new perscription for a MEDROL Dosepak to follow the Depo shot for the lung inflammation.Marland KitchenMarland Kitchen 5)  Today we did your follow up CXR & blood work... please call the "phone tree" in a few days for your  lab results.Marland KitchenMarland Kitchen 6)  Try to reduce your stress level through whatever means possible... 7)  Call for questions... 8)  Keep your follow up appt sched in June... Prescriptions: METHYLPREDNISOLONE (PAK) 4 MG TABS (METHYLPREDNISOLONE) take as directed til gone...  #1 pack x 0   Entered and Authorized by:   Noralee Space MD   Signed by:   Noralee Space MD on 07/19/2009   Method used:   Print then Give to Patient   RxID:   [id number] NEXIUM 40 MG CPDR (ESOMEPRAZOLE MAGNESIUM) take 1 cap by mouth two times a day- 30 min before breakfast & dinner  #60 x prn   Entered and Authorized by:   Noralee Space MD   Signed by:   Noralee Space MD on 07/19/2009   Method used:   Print then Give to Patient   RxID:   [id number]

## 2010-06-12 NOTE — Progress Notes (Signed)
Summary: FYI  Phone Note Call from Patient Call back at [facility] Phone [phone]   Caller: Patient Call For: nadel Summary of Call: FYI: Pt just wants to express her thanks for helping her with the assistance program, in her words,"I'm just so thankful and amazed that you have time to help with this program considering how busy you are, and I consider it a blessing, I just want to express my thanks again." Initial call taken by: Netta Neat,  February 16, 2010 10:35 AM  Follow-up for Phone Call        will forward message to SN as an FYI.  Matthew Folks LPN  February 16, 1414 10:56 AM     SN is aware of message [name] [facility]  February 16, 2010 11:50 AM

## 2010-06-12 NOTE — Assessment & Plan Note (Signed)
Summary: 6 months/apc   Primary Care Provider:  Teressa Lower, MD  CC:  3 month ROV & review of mult medical issues....  History of Present Illness: [name] here for a follow up visit... he has multiple medical problems as noted below...      ~  Jun10:  she is also followed by [facility] for GI- f/u colonoscopy done 3/10 showing another adenomatous polyp... she reports an episode of left flank pain w/ ER eval showing a kidney stone- she passed on her own... had her yearly Mammogram 5/10- neg (mother & sister both had breast cancer)... her weight is up 8# to 191# and she is committed to diet + exercise this summer...  ~  Dec10:  feeling well- no further kidney stone problems although CT 4/10 showed tiny left lower pole calculus...  asthma remains under control w/o exac; wt down 6# to 185#; GERD breakthrough on Protonix & she requests Oyens; still not sleeping & intol Ambien w/ nightmares- try Lorazepam Qhs.   ~  July 19, 2009:  c/o incr asthma symptoms- chest heavy, SOB, noct cough, sneezing... she's only been doing the Advair Qam & we discussed incr to Bid + Depo80 & Dosepak... also c/o decr memory but she wants to wait on meds...   ~  October 24, 2009:  32moROV & improved using the Advair two times a day... no recent flairs, breathing good, denies cough/ phlegm/ etc... BP controlled on Norvasc;  still trying to control Chol on diet alone- weight= 188#, down 2#;  we discussed diet + exercise but she notes more trouble walking & we will refer to DrOlin to check her knee &  that she can't do water exercise due to "allergy attacks" in the pool...     Current Problem List:    she had the 2010 flu vaccine.  ALLERGIC RHINITIS (ICD-477.9) - she uses OTC Claritin, Saline and FLONASE... she really likes the "netti pot"...  ASTHMA (ICD-493.90) - stable on ADVAIR 250Bid...   ~  CXR 3/11 showed hypoaeration, basilar atx, NAD...  ~  3/11:  notes recent incr cough, SOB, chest heavy, sneezing, etc... Rx w/  incr ADVAIR to Bid + Depo/ Dosepak> resolved.  HYPERTENSION (ICD-401.9) - controlled on NORVASC 568md... BP today = 134/80 & BP's even better at home... denies HA, visual changes, CP, palipit, syncope, dyspnea, edema, etc...  HYPERCHOLESTEROLEMIA, BORDERLINE (ICD-272.4) - on diet + exercise, doesn't want meds.  ~  FLBenzie/08 showed TChol 220, TG 279, HDL 43, LDL 122  ~  FLP 6/10 showed TChol 204, TG 167, HDL 43, LDL 123... needs better diet, get wt down.  ~  FLP 6/11 showed TChol 221, TG 385, HDL 40, LDL 116... must get wt down! discussed low chol, low fat.  GERD (ICD-530.81) - prev on Protonix but insurance won't pay, therefore switch to N[place]023m... ?Omep caused diarrhea.  ~  last EGD 9/06 showed esophagitis...  Hx Gallstones -  s/p ERCP w/ sphincterotomy 12/04... and subsequent cholecystectomy...  DIVERTICULOSIS OF COLON (ICD-562.10) IBS (ICD-564.1) COLONIC POLYPS (ICD-211.3)  ~  colonoscopy 12/00 showed divertics, hems, colon polyp- adenomatous...   ~  f/u colonoscopy 3/10 by DrKaplan w/ polyp removed- adenomatous, f/u planned 5 yrs.  Hx of NEPHROLITHIASIS (ICD-592.0) - went to ER 4/10 w/ left flank pain & CT showed mild hydroneph & kidney stone... passed it on her own & followed by Urology, but we don't have notes...  OSTEOARTHRITIS (ICD-715.90) - she has some left hip &  R>L knee discomfort...  uses Tylenol for pain... she notes prev right knee arthroscopy in 1999 by DrRendall.  ~  6/11:  we discussed ortho referral & will set up refer to DrOlin per request.  FIBROMYALGIA (ICD-729.1) - takes Calcium, MVI, Vit D...  VITAMIN D DEFICIENCY (ICD-268.9)  ~  labs 6/10 showed Vit D level= 21... rec> Vit D 1000 u daily.  ~  labs 3/11 showed Vit D level= 35... continue same. POSTCONCUSSION SYNDROME (ICD-310.2) - from [facility] 12/08, fully recovered & back to baseline. CERVICAL STRAIN, ACUTE (ICD-847.0) - "I do exercises and have a good pillow"... DIZZINESS (ICD-780.4) - uses MECLIZINE 75m  Prn...  ANXIETY (ICD-300.00) - INTOL Ambien and Lorazepam w/ nightmares...  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   Preventive Screening-Counseling & Management  Alcohol-Tobacco     Smoking Status: quit < 6 months     Year Quit: 1967  Allergies: 1)  ! Celexa 2)  ! * Hydrocodone/apap 3)  ! * Droperidol 4)  ! * Clonedine 5)  ! * Benicar 6)  ! * Lotrel 7)  ! * Cyclobenzaprine 8)  ! * Inflammatory Medicines 9)  ! Amoxicillin 10)  ! Zithromax 11)  ! * Metoclopramide 12)  ! * Chlorzoxazone 13)  ! Toradol 14)  ! Morphine  Comments:  Nurse/Medical Assistant: The patient's medications and allergies were reviewed with the patient and were updated in the Medication and Allergy Lists.  Past History:  Past Medical History: ALLERGIC RHINITIS (ICD-477.9) ASTHMA (ICD-493.90) HYPERTENSION (ICD-401.9) HYPERCHOLESTEROLEMIA, BORDERLINE (ICD-272.4) GERD (ICD-530.81) DIVERTICULOSIS OF COLON (ICD-562.10) IBS (ICD-564.1) COLONIC POLYPS (ICD-211.3) Hx of NEPHROLITHIASIS (ICD-592.0) OSTEOARTHRITIS (ICD-715.90) FIBROMYALGIA (ICD-729.1) VITAMIN D DEFICIENCY (ICD-268.9) POSTCONCUSSION SYNDROME (ICD-310.2) CERVICAL STRAIN, ACUTE (ICD-847.0) DIZZINESS (ICD-780.4) ANXIETY (ICD-300.00) PERSONAL HISTORY ALLERGY UNSPEC MEDICINAL AGENT (ICD-V14.9)  Past Surgical History: S/P Lap Chole for gallstone pancreatitis in 2004 Cholecystectomy Thyroid Surgery  Family History: Reviewed history from 12/29/2007 and no changes required. mother died age 3453from cancer and heart problems father died age 89044from car accident 4 siblings 1 sister alive age 72 hx of breast cancer 147yrs ago 1 sister alive age [id number] in good health 1 sister died age 1015634from pancreatitis,etoh abuse 1 sibling living at 781with alcohol abuse, muscle and nerve disorder  Social History: Reviewed history from 12/29/2007 and no changes  required. retired quit smoking at age [age]divorced 1 child exercises 3x a week drinks 1 cup caffeine every other day no etohSmoking Status:  quit < 6 months  Review of Systems      See HPI       The patient complains of dyspnea on exertion.  The patient denies anorexia, fever, weight loss, weight gain, vision loss, decreased hearing, hoarseness, chest pain, syncope, peripheral edema, prolonged cough, headaches, hemoptysis, abdominal pain, melena, hematochezia, severe indigestion/heartburn, hematuria, incontinence, muscle weakness, suspicious skin lesions, transient blindness, difficulty walking, depression, unusual weight change, abnormal bleeding, enlarged lymph nodes, and angioedema.    Vital Signs:  Patient profile:   70year old female Height:      62 inches Weight:      188 pounds BMI:     34.51 O2 Sat:      94 % on Room air Temp:     97.4 degrees F oral Pulse rate:   97 / minute BP sitting:   134 / 80  (left arm) Cuff size:   regular  Vitals Entered By:  Elita Boone CMA (October 24, 2009 10:03 AM)  O2 Sat at Rest %:  94 O2 Flow:  Room air CC: 3 month ROV & review of mult medical issues... Is Patient Diabetic? No Pain Assessment Patient in pain? no      Comments meds updated today with pt   Physical Exam  Additional Exam:  WD, WN, [name] in NAD... GENERAL:  Alert & oriented; pleasant & cooperative... HEENT:  [place]/AT, EOM-wnl, PERRLA, EACs-clear, TMs-wnl, NOSE: sl pale, no exud; THROAT-clear & wnl. NECK:  Supple w/ fair ROM; no JVD; normal carotid impulses w/o bruits; no thyromegaly or nodules palpated; no lymphadenopathy. CHEST:  scat rhonchi & end exp wheezing, no rales or signs of consolidation... HEART:  Regular Rhythm; without murmurs/ rubs/ or gallops detected... ABDOMEN:  Soft & nontender; normal bowel sounds; no organomegaly or masses palpated... EXT: without deformities, mild arthritic changes; no varicose veins/ venous insuffic/ or edema. BACK:  she has  numerous trigger point and tender spots thru the shoulders, back, chest etc... NEURO:  CN's intact; no focal neuro deficits... DERM:  No lesions noted; no rash etc...    MISC. Report  Procedure date:  10/24/2009  Findings:      Lipid Panel (LIPID)   Cholesterol          [H]  221 mg/dL                   0-200   Triglycerides        [H]  385.0 mg/dL                 0.0-149.0   HDL                       40.30 mg/dL                 >39.0  Cholesterol LDL - Direct                             115.7 mg/dL   Impression & Recommendations:  Problem # 1:  ASTHMA (ICD-493.90) Controlled now on the Advair Bid... The following medications were removed from the medication list:    Methylprednisolone (pak) 4 Mg Tabs (Methylprednisolone) .Marland Kitchen... Take as directed til gone... Her updated medication list for this problem includes:    Advair Diskus 250-50 Mcg/dose Misc (Fluticasone-salmeterol) ..... Inhale 1 puff two times a day  Problem # 2:  HYPERTENSION (ICD-401.9) Controlled on the Amlodipine... Her updated medication list for this problem includes:    Amlodipine Besylate 5 Mg Tabs (Amlodipine besylate) .Marland Kitchen... Take 1 tablet by mouth once a day  Problem # 3:  HYPERCHOLESTEROLEMIA, BORDERLINE (ICD-272.4) F/u FLP on diet alone shows worening numbers esp TG>>> must do better on low chol, low fat, low carb diet & get weight down... Orders: TLB-Lipid Panel (80061-LIPID)  Problem # 4:  GERD (ICD-530.81) Continue Nexium Rx... Her updated medication list for this problem includes:    Nexium 40 Mg Cpdr (Esomeprazole magnesium) .Marland Kitchen... Take 1 cap by mouth two times a day- 30 min before breakfast & dinner  Problem # 5:  Hx of NEPHROLITHIASIS (ICD-592.0) She tells me that she has f/u w/ Urology today...  Problem # 6:  OSTEOARTHRITIS (ICD-715.90) We will set up referral re Knee pain w/ DrOlin at her request... Orders: Orthopedic Referral (Ortho)  Problem # 7:  OTHER MEDICAL PROBLEMS AS  NOTED>>>  Complete Medication  List: 1)  [facility] Loratadine 10 Mg Tabs (Loratadine) .... Take 1 tablet by mouth once a day 2)  Fluticasone Propionate 50 Mcg/act Susp (Fluticasone propionate) .Marland Kitchen.. 1 spray in each nostril  every morning and every evening 3)  [facility] Saline Nasal Spray 0.65 % Soln (Saline) .... As needed 4)  Mucinex 600 Mg Tb12 (Guaifenesin) .Marland Kitchen.. 1-2 tabs by mouth two times a day w/ fluids.Marland KitchenMarland Kitchen 5)  Advair Diskus 250-50 Mcg/dose Misc (Fluticasone-salmeterol) .... Inhale 1 puff two times a day 6)  Amlodipine Besylate 5 Mg Tabs (Amlodipine besylate) .... Take 1 tablet by mouth once a day 7)  Nexium 40 Mg Cpdr (Esomeprazole magnesium) .... Take 1 cap by mouth two times a day- 30 min before breakfast & dinner 8)  Centrum Silver Tabs (Multiple vitamins-minerals) .... Take 1 tablet by mouth once a day 9)  Meclizine Hcl 25 Mg Tabs (Meclizine hcl) .... Take 1 tab by mouth every 6 h as needed for dizziness.  Patient Instructions: 1)  Today we updated your med list- see below.... 2)  Continue your current meds the same for now... 3)  Let's get on track w/ our diet + exercise program... the goal is to lose 10-15 lbs... 4)  We will set up an appt w/ DrOlin at [place] to check your knee... 5)  Today we did your f/u FASTING lipid profile...  please call the "phone tree" in a few days for your lab results.Marland KitchenMarland Kitchen 6)  Call for any questions.Marland KitchenMarland Kitchen 7)  Please schedule a follow-up appointment in 6 months.

## 2010-06-12 NOTE — Progress Notes (Signed)
Summary: rx's for pt assist. program  Phone Note Call from Patient Call back at [facility] Phone [phone]   Caller: Patient Call For: [name] Summary of Call: pt needs rx's for FLUTICASONE and NEXIUM. she will pick these up when ready. this is for [facility]- pt assistance program.  Initial call taken by: Cooper Render, CNA,  February 06, 2010 3:19 PM  Follow-up for Phone Call        Pt received forms to complete for pt assistance program and was reading on there that she needed rx for medications to send with the forms. I advised that once she completes these forms she needs to bring them back to the office because the MD will also need to sign off on them and then we will send in the prescriptions with the forms. Pt stated understanding of the above.[place] Bing CMA  February 06, 2010 3:58 PM

## 2010-06-12 NOTE — Progress Notes (Signed)
Summary: allergy attack-give zpak and clarinex  Phone Note Call from Patient   Caller: Patient Call For: nadel Reason for Call: Privacy/Consent Authorization Summary of Call: pt having allergy attack would like something called [facility] Initial call taken by: Gustavus Bryant,  January 23, 2010 9:20 AM  Follow-up for Phone Call        The patient c/o increased snezing and cough with clear mucus. She says things improved for a short time after taking Zpak and is requesting another RX for this. She is out of her Flonase and Amlodipine and we will send RX for these meds to her pharmacy. Patient is still using her Neti Pot and loratadine daily. Please advise of any recs.Francesca Jewett [facility]  January 23, 2010 9:50 AM  Additional Follow-up for Phone Call Additional follow up Details #1::        per SN----ok for her to have zpak #1    use clarinex 25m   #30   1 by mouth once daily  refill as needed and flonase  #1   2 sprays in each nostril two times a day refill as needed .  thanks LDel[place] January 23, 2010 11:07 AM   all prescriptions sent. pt aware of recs. JRandolph BingCMA  January 23, 2010 11:13 AM     New/Updated Medications: ZITHROMAX Z-PAK 250 MG TABS (AZITHROMYCIN) as directed CLARINEX 5 MG TABS (DESLORATADINE) Take 1 tablet by mouth once a day Prescriptions: CLARINEX 5 MG TABS (DESLORATADINE) Take 1 tablet by mouth once a day  #30 x prn   Entered by:   JRandolph BingCMA   Authorized by:   SNoralee SpaceMD   Signed by:   JRandolph BingCMA on 01/23/2010   Method used:   [phone]       [address]       [place]  [address]      Ph: [phone] [phone]      Fax: [phone]  RxID:   [id number] Z-PAK 250 MG TABS (AZITHROMYCIN) as directed  #1 pak x 0   Entered by:   JRandolph BingCMA   Authorized by:   SNoralee SpaceMD   Signed by:   JRandolph BingCMA on 01/23/2010   Method used:   [phone]       [address]       [place]  [address]      Ph: [phone] [phone]      Fax: [phone]  RxID:   [id number] BESYLATE 5 MG TABS (AMLODIPINE BESYLATE) Take 1 tablet by mouth once a day  #30 Tablet x 5   Entered by:   LFrancesca JewettCMA   Authorized by:   SNoralee SpaceMD   Signed by:   LFrancesca JewettCMA on 01/23/2010   Method used:   [phone] [facility]       [address]       [place]  [address]      Ph: [phone] [phone]  Fax: [phone]   RxID:   [id number] FLUTICASONE PROPIONATE 50 MCG/ACT SUSP (FLUTICASONE PROPIONATE) 1 spray in each nostril  every morning and every evening  #16 Gram x 5   Entered by:   Francesca Jewett CMA   Authorized by:   Noralee Space MD   Signed by:   Francesca Jewett CMA on 01/23/2010   Method used:   Electronically to        [facility] [address] [phone]* (retail)       [address]       [place], [place]  [address]       Ph: [phone] or [phone]       Fax: [phone]   RxID:   [id number]

## 2010-06-12 NOTE — Progress Notes (Signed)
Summary: fax for medication  Phone Note Call from Patient   Caller: Patient Call For: nadel Summary of Call: pt calling [facility] fax that was sent to [place] and kleine for advair. Initial call taken by: Gustavus Bryant,  March 19, 2010 8:59 AM  Follow-up for Phone Call        [facility].Francesca Jewett [facility]  March 19, 2010 9:30 AM  Patient called back and says she spoke with one of our PCC's and this has been taken care of.Francesca Jewett [facility]  March 19, 2010 12:10 PM

## 2010-06-12 NOTE — Progress Notes (Signed)
Summary: PA for clarinex - denial received and placed on CDY's cart  Phone Note Outgoing Call   Summary of Call: prior auth done for clarinex 13m once daily.  pending for approval.  called [phone] prior auth. LElita BooneCRegional [facility] January 23, 2010 4:53 PM   Follow-up for Phone Call        PA denial received from [employer].  Denial placed on CDY's cart for review. JParke PoissonCNA/MA  January 24, 2010 2:31 PM   will hold message in Katie's box for follow up. JParke PoissonCNA/MA  January 24, 2010 4:21 PM   Additional Follow-up for Phone Call Additional follow up Details #1::        Dr. YAnnamaria Boots please advise of what med we could change to.KClayborne DanaCMA  January 26, 2010 12:15 PM   placed on SN cart and SN stated that we can use flonase 2 spray two times a day #1  and allegra 1831m otc  1 by mouth every morning.  attempted to call pt but had to leave message on machine. LeElita BooneMA  January 30, 2010 12:12 PM  Additional Follow-up by: TaMaryann ConnersMA,  January 30, 2010 2:16 PM    Additional Follow-up for Phone Call Additional follow up Details #2::    pt aware of sn's recs and states she already has the flonase and will use this also she has loratadine to use in place of the clarinex and sn said this was fine, no rx's needed at this time Follow-up by: TaMartinton January 30, 2010 2:21 PM

## 2010-06-12 NOTE — Progress Notes (Signed)
Summary: asthma flare - going to UC for neb tx  Phone Note Call from Patient Call back at [facility] Phone [phone]   Caller: Patient Call For: nadel Reason for Call: Talk to Nurse Summary of Call: extreme cough, chest congestion, wheezing, sneezing.  Want to be seen today if possible, otherwise will go to ED. Initial call taken by: Zigmund Gottron,  January 10, 2010 8:09 AM  Follow-up for Phone Call        called spoke with patient.  c/o wheezing, hoarseness, prod cough with clear, increased, sneezing onset last night.  pt states that she made an appt with TP tomorrow afternoon (verified) but is going to go to the UC for a breathing treatment until then.  encouraged pt that if her breathing does not improve or worsens to call us or go to the ER.  pt verbalized her understanding.  will forward message to SN as FYI. Parke Poisson CNA/MA  January 10, 2010 8:46 AM   Additional Follow-up for Phone Call Additional follow up Details #1::        per SN----zpak #1   take as directed and sterapred dosepak   86m/6 day pack  take as directed.  thanks [place] January 10, 2010 11:22 AM   Both rxs called to pharm. Spoke with pt and advised this was done. Additional Follow-up by: LTilden Dome  January 10, 2010 11:32 AM

## 2010-06-12 NOTE — Medication Information (Signed)
Summary: Application/GSK Access  Application/GSK Access   Imported By: Phillis Knack 03/01/2010 10:44:26  _____________________________________________________________________  External Attachment:    Type:   Image     Comment:   External Document

## 2010-06-12 NOTE — Progress Notes (Signed)
Summary: rx assistance program  Phone Note Call from Patient Call back at Home Phone [phone]   Caller: Patient Call For: [name] Summary of Call: pt dropped off completed packet for rx drug assistance program-GSK. this is on my desk when you want to pick up. thanks.  Initial call taken by: Cooper Render, CNA,  February 07, 2010 1:40 PM  Follow-up for Phone Call        paperwork is on [employer].  Jinny Blossom Reynolds LPN  February 08, 2992 3:05 PM   Additional Follow-up for Phone Call Additional follow up Details #1::        pt assistance forms have been faxed back for nexium 42m daily.   LElita BooneCMA  February 07, 2010 4:12 PM     New/Updated Medications: NEXIUM 40 MG CPDR (ESOMEPRAZOLE MAGNESIUM) take one tablet by mouth once daily Prescriptions: NEXIUM 40 MG CPDR (ESOMEPRAZOLE MAGNESIUM) take one tablet by mouth once daily  #90 x 3   Entered by:   LElita BooneCMA   Authorized by:   SNoralee SpaceMD   Signed by:   LElita BooneCMA on 02/07/2010   Method used:   Print then Give to Patient   RxID:   [id number]

## 2010-06-12 NOTE — Assessment & Plan Note (Signed)
Summary: Acute NP office visit - asthma   Primary Provider/Referring Provider:  Teressa Lower, MD  CC:  increased SOB, wheezing, prod cough with clear mucus, and fever x90month  History of Present Illness: 89ear-old white female patient of Dr. NLenna Gilfordwho has a known history of asthmatic bronchitis, hypertension, osteoarthritis and hyperlipidemia.     ~  Jun10:  [facility] done 3/10 showing another adenomatous polyp... she reports an episode of left flank pain w/ ER eval showing a kidney stone- she passed on her own... had her yearly Mammogram 5/10- neg (mother & sister both had breast cancer)... her weight is up 8# to 191# and she is committed to diet + exercise this summer...  ~  Dec10:  feeling well- no further kidney stone problems although CT 4/10 showed tiny left lower pole calculus...  asthma remains under control w/o exac; wt down 6# to 185#; GERD breakthrough on Protonix & [place] still not sleeping & intol Ambien w/ nightmares- try Lorazepam Qhs.   ~  July 19, 2009:  c/o incr asthma symptoms- chest heavy, SOB, noct cough, sneezing... she's only been doing the Advair Qam & we discussed incr to Bid + Depo80 & Dosepak... also c/o decr memory but she wants to wait on meds...   ~  October 24, 2009:  310moOV & improved using the Advair two times a day... no recent flairs, breathing good, denies cough/ phlegm/ etc... BP controlled on Norvasc;  still trying to control Chol on diet alone- weight= 188#, down 2#;  we discussed diet + exercise but she notes more trouble walking & we will refer to DrOlin to check her knee &  that she can't do water exercise due to "allergy attacks" in the pool...   02/12/10---Presents for an acute office visit. Complains of increased SOB, wheezing, prod cough with clear mucus, fever x1m36monthas been given 2 zpack without much improvement. Cough is very harsh but only clear mucus is coming up. Denies chest pain,  orthopnea, hemoptysis, fever, n/v/d, edema, headache.        Medications Prior to Update: 1)  Cvs Loratadine 10 Mg  Tabs (Loratadine) .... Take 1 Tablet By Mouth Once A Day 2)  Fluticasone Propionate 50 Mcg/act Susp (Fluticasone Propionate) ....Marland Kitchen1 Spray in Each Nostril  Every Morning and Every Evening 3)  Cvs Saline Nasal Spray 0.65 %  Soln (Saline) .... As Needed 4)  Mucinex 600 Mg  Tb12 (Guaifenesin) ....Marland Kitchen1-2 Tabs By Mouth Two Times A Day W/ Fluids... Marland KitchenMarland Kitchen  Advair Diskus 250-50 Mcg/dose Misc (Fluticasone-Salmeterol) .... Inhale 1 Puff Two Times A Day 6)  Amlodipine Besylate 5 Mg Tabs (Amlodipine Besylate) .... Take 1 Tablet By Mouth Once A Day 7)  Nexium 40 Mg Cpdr (Esomeprazole Magnesium) .... Take One Tablet By Mouth Once Daily 8)  Centrum Silver   Tabs (Multiple Vitamins-Minerals) .... Take 1 Tablet By Mouth Once A Day 9)  Zithromax Z-Pak 250 Mg Tabs (Azithromycin) .... As Directed 10)  Clarinex 5 Mg Tabs (Desloratadine) .... Take 1 Tablet By Mouth Once A Day 11)  Meclizine Hcl 25 Mg Tabs (Meclizine Hcl) .... Take 1 Tab By Mouth Every 6 H As Needed For Dizziness.  Current Medications (verified): 1)  Cvs Loratadine 10 Mg  Tabs (Loratadine) .... Take 1 Tablet By Mouth Once A Day 2)  Fluticasone Propionate 50 Mcg/act Susp (Fluticasone Propionate) ....Marland Kitchen1 Spray in Each Nostril  Every Morning and Every Evening  3)  Cvs Saline Nasal Spray 0.65 %  Soln (Saline) .... As Needed 4)  Mucinex 600 Mg  Tb12 (Guaifenesin) .Marland Kitchen.. 1-2 Tabs By Mouth Two Times A Day W/ Fluids.Marland KitchenMarland Kitchen 5)  Advair Diskus 250-50 Mcg/dose Misc (Fluticasone-Salmeterol) .... Inhale 1 Puff Two Times A Day 6)  Amlodipine Besylate 5 Mg Tabs (Amlodipine Besylate) .... Take 1 Tablet By Mouth Once A Day 7)  Nexium 40 Mg Cpdr (Esomeprazole Magnesium) .... Take One Tablet By Mouth Once Daily 8)  Centrum Silver   Tabs (Multiple Vitamins-Minerals) .... Take 1 Tablet By Mouth Once A Day 9)  Meclizine Hcl 25 Mg Tabs (Meclizine Hcl) .... Take 1 Tab  By Mouth Every 6 H As Needed For Dizziness. 10)  Clarinex 5 Mg Tabs (Desloratadine) .... Take 1 Tablet By Mouth Once A Day 11)  Vitamin D 1000 Unit Tabs (Cholecalciferol) .... Take 1 Tablet By Mouth Once A Day  Allergies: 1)  ! Celexa 2)  ! * Hydrocodone/apap 3)  ! * Droperidol 4)  ! * Clonedine 5)  ! * Benicar 6)  ! * Lotrel 7)  ! * Cyclobenzaprine 8)  ! * Inflammatory Medicines 9)  ! Amoxicillin 10)  ! Zithromax 11)  ! * Metoclopramide 12)  ! * Chlorzoxazone 13)  ! Toradol 14)  ! Morphine  Past History:  Past Medical History: Last updated: 10/24/2009 ALLERGIC RHINITIS (ICD-477.9) ASTHMA (ICD-493.90) HYPERTENSION (ICD-401.9) HYPERCHOLESTEROLEMIA, BORDERLINE (ICD-272.4) GERD (ICD-530.81) DIVERTICULOSIS OF COLON (ICD-562.10) IBS (ICD-564.1) COLONIC POLYPS (ICD-211.3) Hx of NEPHROLITHIASIS (ICD-592.0) OSTEOARTHRITIS (ICD-715.90) FIBROMYALGIA (ICD-729.1) VITAMIN D DEFICIENCY (ICD-268.9) POSTCONCUSSION SYNDROME (ICD-310.2) CERVICAL STRAIN, ACUTE (ICD-847.0) DIZZINESS (ICD-780.4) ANXIETY (ICD-300.00) PERSONAL HISTORY ALLERGY UNSPEC MEDICINAL AGENT (ICD-V14.9)  Past Surgical History: Last updated: 10/24/2009 S/P Lap Chole for gallstone pancreatitis in 2004 Cholecystectomy Thyroid Surgery  Family History: Last updated: 2008/01/01 mother died age 2 from cancer and heart problems father died age 51 from car accident 4 siblings 1 sister alive age 45  hx of breast cancer 77 yrs ago 1 sister alive age 24  in good health 1 sister died age 10 from pancreatitis,etoh abuse 1 sibling living at 81 with alcohol abuse, muscle and nerve disorder  Social History: Last updated: January 01, 2008 retired quit smoking at age 2 divorced 1 child exercises 3x a week drinks 1 cup caffeine every other day no etoh  Risk Factors: Smoking Status: quit < 6 months (10/24/2009)  Review of Systems      See HPI  Vital Signs:  Patient profile:   70 year old female Height:      62  inches Weight:      199 pounds BMI:     36.53 O2 Sat:      97 % on Room air Temp:     98.9 degrees F oral Pulse rate:   92 / minute BP sitting:   140 / 88  (right arm) Cuff size:   regular  Vitals Entered By: Parke Poisson CNA/MA (February 12, 2010 3:08 PM)  O2 Flow:  Room air CC: increased SOB, wheezing, prod cough with clear mucus, fever x80monthIs Patient Diabetic? No Comments Medications reviewed with patient Daytime contact number verified with patient. JParke PoissonCNA/MA  February 12, 2010 3:11 PM    Physical Exam  Additional Exam:  WD, WN, 70y/o WF in NAD... GENERAL:  Alert & oriented; pleasant & cooperative... HEENT:  [place]/AT, EOM-wnl, PERRLA, EACs-clear, TMs-wnl, NOSE: red, non specific turbinate edema.  THROAT-clear & wnl. NECK:  Supple w/ fair ROM; no  JVD; normal carotid impulses w/o bruits; no thyromegaly or nodules palpated; no lymphadenopathy. CHEST:  Coarse BS w/ no wheezing ... HEART:  Regular Rhythm; without murmurs/ rubs/ or gallops detected... ABDOMEN:  Soft & nontender; normal bowel sounds; no organomegaly or masses palpated... EXT: without deformities, mild arthritic changes; no varicose veins/ venous insuffic/ or edema. NEURO:  CN's intact; no focal neuro deficits... DERM:  No lesions noted; no rash etc...   Impression & Recommendations:  Problem # 1:  ASTHMA (ICD-493.90) Slow to resolve asthmatic bronchitic exacerbation  Plan : xopenex neb and depo medro 174m im injection in office done.  Prednisone taper over next week.  Mcinex DM two times a day as needed for cough  Phenergan VC w/ codeine 1-2 tsp every 4hr as needed cough, may make you sleepy.  use sugaless candy , ice chips and water to help not cough.  Please contact office for sooner follow up if symptoms do not improve or worsen  follow up Dr. NLenna Gilfordin 1 month  Orders: T-2 View CXR (71020TC)  Medications Added to Medication List This Visit: 1)  Vitamin D 1000 Unit Tabs (Cholecalciferol)  .... Take 1 tablet by mouth once a day 2)  Prednisone 10 Mg Tabs (Prednisone) .... 4 tabs for 3 days, then 3 tabs for 3 days, 2 tabs for 3 days, then 1 tab for 3 days, then stop 3)  Promethazine Vc/codeine 6.25-5-10 Mg/553mSyrp (Phenyleph-promethazine-cod) .... 1/2 -1 tsp every 4-6 hr as needed cough , may make you sleepy  Other Orders: Nebulizer Tx ([phone] of Therapeutic Inj  intramuscular or subcutaneous ([phone]- Medrol 8065mJ1040) Depo- Medrol 58m6m1030) Est. Patient Level IV ([phone] Instructions: 1)  Prednisone taper over next week.  2)  Mcinex DM two times a day as needed for cough  3)  Phenergan VC w/ codeine 1-2 tsp every 4hr as needed cough, may make you sleepy.  4)  use sugaless candy , ice chips and water to help not cough.  5)  Please contact office for sooner follow up if symptoms do not improve or worsen  6)  follow up Dr. NadeLenna Gilford1 month  Prescriptions: PROMETHAZINE VC/CODEINE 6.25-5-10 MG/5ML SYRP (PHENYLEPH-PROMETHAZINE-COD) 1/2 -1 tsp every 4-6 hr as needed cough , may make you sleepy  #8 oz x 0   Entered and Authorized by:   TammRexene Edison  Signed by:   Cecilio Ohlrich NP on 02/12/2010   Method used:   Print then Give to Patient   RxID:   [id number] 10 MG TABS (PREDNISONE) 4 tabs for 3 days, then 3 tabs for 3 days, 2 tabs for 3 days, then 1 tab for 3 days, then stop  #30 x 0   Entered and Authorized by:   TammRexene Edison  Signed by:   Quyen Cutsforth NP on 02/12/2010   Method used:   [facility]        [facility] #[id number])       [address]    [place]  [id number]   Ph: [phone]   Fax: [phone]:   [id number] History:  Influenza Immunization History:    Influenza:  historical (02/10/2009)    Medication Administration  Injection # 1:    Medication: Depo- Medrol 58mg90m  Diagnosis: ASTHMA  (ICD-493.90)    Route: IM    Site: RUOQ gluteus    Exp Date: 08-2012    Lot #: [id number]    Mfr: Pharmacia    Patient tolerated injection without complications    Given by: Parke Poisson CNA/MA (February 12, 2010 4:52 PM)  Injection # 2:    Medication: Depo- Medrol 67m    Diagnosis: ASTHMA (ICD-493.90)    Route: IM    Site: RUOQ gluteus    Exp Date: 08-2012    Lot #: [id number]    Mfr: Pharmacia    Patient tolerated injection without complications    Given by: JParke PoissonCNA/MA (February 12, 2010 4:52 PM)  Orders Added: 1)  Nebulizer Tx [[phone])  Admin of Therapeutic Inj  intramuscular or subcutaneous [[id number]] 3)  Depo- Medrol 846m[J1040] 4)  Depo- Medrol 4037mJ1030] 5)  T-2 View CXR [[id number]] 6)  Est. Patient Level IV [[phone][[id number]]

## 2010-06-14 NOTE — Assessment & Plan Note (Signed)
Summary: rov 6 months///kp   Primary Care Leroy Trim:  Teressa Lower, MD  CC:  6 month ROV & review of mult medical problems....  History of Present Illness: [name] here for a follow up visit... he has multiple medical problems as noted below...      ~  July 19, 2009:  c/o incr asthma symptoms- chest heavy, SOB, noct cough, sneezing... she's only been doing the Advair Qam & we discussed incr to Bid + Depo80 & Dosepak... also c/o decr memory but she wants to wait on meds...   ~  October 24, 2009:  37moROV & improved using the Advair two times a day... no recent flairs, breathing good, denies cough/ phlegm/ etc... BP controlled on Norvasc;  still trying to control Chol on diet alone- weight= 188#, down 2#;  we discussed diet + exercise but she notes more trouble walking & we will refer to DrOlin to check her knee &  that she can't do water exercise due to "allergy attacks" in the pool...    ~  April 24, 2010:  634moOV- c/o right side pain,worse in the eve, deep in flank area, no rash, she thought kid stone- eval DrOttelin 11/11 neg, Mobic helped some, exam w/ tenderness over floating ribs... otherw astma OK;  BP remains stable;  Chol rx w/ diet alone & wt down 8# to 180#;  etc...    Current Problem List:    she had the 2010 flu vaccine.  ALLERGIC RHINITIS (ICD-477.9) - she uses OTC Claritin, Saline and FLONASE... she really likes the "netti pot"...  ASTHMA (ICD-493.90) - stable on ADVAIR 250Bid... mild exac 3/11 Rx w/ Pred & resolved.  ~  CXR 3/11 showed hypoaeration, basilar atx, NAD.  ~  CXR 10/11 showed clear, DJD spine, NAD.  HYPERTENSION (ICD-401.9) - controlled on NORVASC 70m66m... BP today = 136/86 & BP's even better at home... denies HA, visual changes, CP, palipit, syncope, dyspnea, edema, etc...  HYPERCHOLESTEROLEMIA, BORDERLINE (ICD-272.4) - on diet + exercise, doesn't want meds.  ~  FLP[place]08 showed TChol 220, TG 279, HDL 43, LDL 122  ~  FLP 6/10 showed TChol 204, TG 167, HDL 43,  LDL 123... needs better diet, get wt down.  ~  FLP 6/11 showed TChol 221, TG 385, HDL 40, LDL 116... must get wt down! discussed low chol, low fat.  GERD (ICD-530.81) - prev on Protonix but insurance won't pay, therefore switch to NEXElkhartm7m.. ?Omep caused diarrhea.  ~  last EGD 9/06 showed esophagitis...  Hx Gallstones -  s/p ERCP w/ sphincterotomy 12/04... and subsequent cholecystectomy...  DIVERTICULOSIS OF COLON (ICD-562.10) IBS (ICD-564.1) COLONIC POLYPS (ICD-211.3)  ~  colonoscopy 12/00 showed divertics, hems, colon polyp- adenomatous...   ~  f/u colonoscopy 3/10 by DrKaplan w/ polyp removed- adenomatous, f/u planned 5 yrs.  Hx of NEPHROLITHIASIS (ICD-592.0) - went to ER 4/10 w/ left flank pain & CT showed mild hydroneph & kidney stone... passed it on her own & followed by Urology- DrOttelin... seen 11/11 w/ right flank pain & neg eval (exam w/ tender ribs on palp),  OSTEOARTHRITIS (ICD-715.90) - she has some left hip & R>L knee discomfort...  uses Tylenol for pain... she notes prev right knee arthroscopy in 1999 by DrRendall... Rx w/ MOBIC 170mg33m, & add VICODIN Prn.  ~  6/11:  we discussed ortho referral & will set up refer to DrOlin per request.  FIBROMYALGIA (ICD-729.1) - takes Calcium, MVI, Vit D, & now VICODIN Prn...Marland KitchenMarland Kitchen  hx mult somatic complaints.  VITAMIN D DEFICIENCY (ICD-268.9)  ~  labs 6/10 showed Vit D level= 21... rec> Vit D 1000 u daily.  ~  labs 3/11 showed Vit D level= 35... continue same. POSTCONCUSSION SYNDROME (ICD-310.2) - from [place] 12/08, fully recovered & back to baseline. CERVICAL STRAIN, ACUTE (ICD-847.0) - "I do exercises and have a good pillow"... DIZZINESS (ICD-780.4) - uses MECLIZINE 32m Prn...  ANXIETY (ICD-300.00) - INTOL Ambien and Lorazepam w/ nightmares...  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   Preventive Screening-Counseling &  Management  Alcohol-Tobacco     Smoking Status: quit < 6 months     Year Quit: 1967     Pack years: 8  Allergies: 1)  ! Celexa 2)  ! * Hydrocodone/apap 3)  ! * Droperidol 4)  ! * Clonedine 5)  ! * Benicar 6)  ! * Lotrel 7)  ! * Cyclobenzaprine 8)  ! * Inflammatory Medicines 9)  ! Amoxicillin 10)  ! Zithromax 11)  ! * Metoclopramide 12)  ! * Chlorzoxazone 13)  ! Toradol 14)  ! Morphine  Comments:  Nurse/Medical Assistant: The patient's medications and allergies were reviewed with the patient and were updated in the Medication and Allergy Lists.  Past History:  Past Medical History: ALLERGIC RHINITIS (ICD-477.9) ASTHMA (ICD-493.90) HYPERTENSION (ICD-401.9) HYPERCHOLESTEROLEMIA, BORDERLINE (ICD-272.4) GERD (ICD-530.81) DIVERTICULOSIS OF COLON (ICD-562.10) IBS (ICD-564.1) COLONIC POLYPS (ICD-211.3) Hx of NEPHROLITHIASIS (ICD-592.0) OSTEOARTHRITIS (ICD-715.90) FIBROMYALGIA (ICD-729.1) VITAMIN D DEFICIENCY (ICD-268.9) POSTCONCUSSION SYNDROME (ICD-310.2) CERVICAL STRAIN, ACUTE (ICD-847.0) DIZZINESS (ICD-780.4) ANXIETY (ICD-300.00) PERSONAL HISTORY ALLERGY UNSPEC MEDICINAL AGENT (ICD-V14.9)  Past Surgical History: S/P Lap Chole for gallstone pancreatitis in 2004 Cholecystectomy Thyroid Surgery  Family History: Reviewed history from 12/29/2007 and no changes required. mother died age 34186from cancer and heart problems father died age 2972from car accident 4 siblings 1 sister alive age 72 hx of breast cancer 117yrs ago 1 sister alive age 72 in good health 1 sister died age 3422from pancreatitis,etoh abuse 1 sibling living at 752with alcohol abuse, muscle and nerve disorder  Social History: Reviewed history from 12/29/2007 and no changes required. retired quit smoking at age 89--1967 divorced 1 child exercises 3x a week drinks 1 cup caffeine every other day no alcohol  Review of Systems      See HPI       The patient complains of decreased hearing,  dyspnea on exertion, headaches, muscle weakness, and difficulty walking.  The patient denies anorexia, fever, weight loss, weight gain, vision loss, hoarseness, chest pain, syncope, peripheral edema, prolonged cough, hemoptysis, abdominal pain, melena, hematochezia, severe indigestion/heartburn, hematuria, incontinence, suspicious skin lesions, transient blindness, depression, unusual weight change, abnormal bleeding, enlarged lymph nodes, and angioedema.    Vital Signs:  Patient profile:   70year old female Height:      62 inches Weight:      179.38 pounds BMI:     32.93 O2 Sat:      98 % on Room air Temp:     97.4 degrees F oral Pulse rate:   80 / minute BP sitting:   136 / 68  (right arm) Cuff size:   regular  Vitals Entered By: LElita BooneCMA (April 24, 2010 10:38 AM)  O2 Sat at Rest %:  98 O2 Flow:  Room air CC: 6 month ROV & review of mult medical problems... Is Patient Diabetic? No Pain Assessment Patient in  pain? yes      Onset of pain  right side pain worse in the 3 days Comments meds updated today with pt   Physical Exam  Additional Exam:  WD, WN, [name] in NAD... GENERAL:  Alert & oriented; pleasant & cooperative... HEENT:  [place]/AT, EOM-wnl, PERRLA, EACs-clear, TMs-wnl, NOSE: sl pale, no exud; THROAT-clear & wnl. NECK:  Supple w/ fair ROM; no JVD; normal carotid impulses w/o bruits; no thyromegaly or nodules palpated; no lymphadenopathy. CHEST:  scat rhonchi & otherw clear, no rales or signs of consolidation... HEART:  Regular Rhythm; without murmurs/ rubs/ or gallops detected... ABDOMEN:  Soft & nontender; normal bowel sounds; no organomegaly or masses palpated... EXT: without deformities, mild arthritic changes; no varicose veins/ venous insuffic/ or edema. BACK:  she has numerous trigger point and tender spots thru the shoulders, back, chest etc... NEURO:  CN's intact; no focal neuro deficits... DERM:  No lesions noted; no rash etc...    Impression &  Recommendations:  Problem # 1:  ASTHMA (ICD-493.90) Stable on the Advair... rec incr exercise etc... The following medications were removed from the medication list:    Prednisone 10 Mg Tabs (Prednisone) .Marland KitchenMarland KitchenMarland KitchenMarland Kitchen 4 tabs for 3 days, then 3 tabs for 3 days, 2 tabs for 3 days, then 1 tab for 3 days, then stop Her updated medication list for this problem includes:    Advair Diskus 250-50 Mcg/dose Misc (Fluticasone-salmeterol) ..... Inhale 1 puff two times a day  Problem # 2:  HYPERTENSION (ICD-401.9) Stable on med>  continue same. Her updated medication list for this problem includes:    Amlodipine Besylate 5 Mg Tabs (Amlodipine besylate) .Marland Kitchen... Take 1 tablet by mouth once a day  Problem # 3:  HYPERCHOLESTEROLEMIA, BORDERLINE (ICD-272.4) Stable on diet alone & she wishes to continue this maint med...  Problem # 4:  GI>>> She has GERD, Divertics, IBS, Polyps; s/p GB... all stable, contiue same Rx.  Problem # 5:  Hx of NEPHROLITHIASIS (ICD-592.0) No recurrent prob identified...  Problem # 6:  OSTEOARTHRITIS (ICD-715.90) We discussed Rx w/ rest, heat, Mobic, Vicodin Prn... Her updated medication list for this problem includes:    Meloxicam 15 Mg Tabs (Meloxicam) .Marland Kitchen... Take 1 tablet by mouth once a day    Hydrocodone-acetaminophen 5-500 Mg Tabs (Hydrocodone-acetaminophen) .Marland Kitchen... Take 1/2 to 1 tab by mouth every 6 h as needed for pain...  Problem # 7:  OTHER MEDICAL PROBLEMS AS NOTED...  Complete Medication List: 1)  Cvs Loratadine 10 Mg Tabs (Loratadine) .... Take 1 tablet by mouth once a day 2)  Fluticasone Propionate 50 Mcg/act Susp (Fluticasone propionate) .Marland Kitchen.. 1 spray in each nostril  every morning and every evening 3)  Cvs Saline Nasal Spray 0.65 % Soln (Saline) .... As needed 4)  Mucinex 600 Mg Tb12 (Guaifenesin) .Marland Kitchen.. 1-2 tabs by mouth two times a day w/ fluids.Marland KitchenMarland Kitchen 5)  Advair Diskus 250-50 Mcg/dose Misc (Fluticasone-salmeterol) .... Inhale 1 puff two times a day 6)  Amlodipine Besylate 5  Mg Tabs (Amlodipine besylate) .... Take 1 tablet by mouth once a day 7)  Nexium 40 Mg Cpdr (Esomeprazole magnesium) .... Take one tablet by mouth once daily 8)  Meloxicam 15 Mg Tabs (Meloxicam) .... Take 1 tablet by mouth once a day 9)  Centrum Silver Tabs (Multiple vitamins-minerals) .... Take 1 tablet by mouth once a day 10)  Vitamin D 1000 Unit Tabs (Cholecalciferol) .... Take 1 tablet by mouth once a day 11)  Meclizine Hcl 25 Mg Tabs (Meclizine  hcl) .... Take 1 tab by mouth every 6 h as needed for dizziness. 12)  Promethazine Vc/codeine 6.25-5-10 Mg/85m Syrp (Phenyleph-promethazine-cod) .... 1/2 -1 tsp every 4-6 hr as needed cough , may make you sleepy 13)  Hydrocodone-acetaminophen 5-500 Mg Tabs (Hydrocodone-acetaminophen) .... Take 1/2 to 1 tab by mouth every 6 h as needed for pain...  Patient Instructions: 1)  Today we updated your med list- see below.... 2)  We wrote a new perscription for generic Vicodin to use 1/2 to 1 tab every 6H as needed for pain..Marland KitchenMarland Kitchen3)  Rest the chest (no heavy lifting etc)..Marland KitchenMarland Kitchen4)  Use heat> heating pad or Icey Hot patches or Myoflex cream etc... 5)  Use a Rib Binder as well... 6)  Call for any questions..Marland KitchenMarland Kitchen7)  Please schedule a follow-up appointment in 6 months, & we will do FASTING blood work at that time..Marland KitchenMarland Kitchen8)  MERRY CHRISTMAS!!! Prescriptions: HYDROCODONE-ACETAMINOPHEN 5-500 MG TABS (HYDROCODONE-ACETAMINOPHEN) take 1/2 to 1 tab by mouth every 6 H as needed for pain...  #50 x 6   Entered and Authorized by:   SNoralee SpaceMD   Signed by:   SNoralee SpaceMD on 04/24/2010   Method used:   Print then Give to Patient   RxID:   [id number]   Immunization History:  Influenza Immunization History:    Influenza:  historical (02/26/2010)

## 2010-07-11 ENCOUNTER — Other Ambulatory Visit: Payer: Self-pay | Admitting: Pulmonary Disease

## 2010-07-11 ENCOUNTER — Encounter: Payer: Self-pay | Admitting: Adult Health

## 2010-07-11 ENCOUNTER — Ambulatory Visit (INDEPENDENT_AMBULATORY_CARE_PROVIDER_SITE_OTHER)
Admission: RE | Admit: 2010-07-11 | Discharge: 2010-07-11 | Disposition: A | Discharge status: Home / Self Care | Payer: Medicare Other | Source: Ambulatory Visit | Attending: Pulmonary Disease | Admitting: Pulmonary Disease

## 2010-07-11 ENCOUNTER — Ambulatory Visit (INDEPENDENT_AMBULATORY_CARE_PROVIDER_SITE_OTHER): Payer: Medicare Other | Admitting: Adult Health

## 2010-07-11 DIAGNOSIS — R079 Chest pain, unspecified: Secondary | ICD-10-CM

## 2010-07-12 DIAGNOSIS — M549 Dorsalgia, unspecified: Secondary | ICD-10-CM | POA: Insufficient documentation

## 2010-07-19 NOTE — Assessment & Plan Note (Signed)
Summary: Acute NP follow up - back pain    Primary Provider/Referring Provider:  Teressa Lower, MD  CC:  poss shingles w/ right flank pain and itching under right breast x3weeks.  History of Present Illness: 70year-old white female patient of Dr. [name] who has a known history of asthmatic bronchitis, hypertension, osteoarthritis and hyperlipidemia.     ~  April 24, 2010:  47moROV- c/o right side pain,worse in the eve, deep in flank area, no rash, she thought kid stone- eval DrOttelin 11/11 neg, Mobic helped some, exam w/ tenderness over floating ribs... otherw astma OK;  BP remains stable;  Chol rx w/ diet alone & wt down 8# to 180#;  etc...  July 11, 2010 --Presents for work in visit for persistent back pain . She is worried she might have shingles but has not rash.  Pt complains over last 3 month she has had right low back pain on/off . Seen last ov tx with back/rib binder, vicodin.She says she got better with only some mild intermittent discomfort.However over last couple of weeks back pain has restarted. She feels like skin is itchy at times esp around neck, breast and has drippy  nose. for last few weeks.  Has no associated urinary symptoms or fever .NO rash. No known injury. no ext weakness,or radicular symptoms. Denies chest pain, dyspnea, orthopnea, hemoptysis, fever, n/v/d, edema, headache.   Medications Prior to Update: 1)  [facility]vs Loratadine 10 Mg  Tabs (Loratadine) .... Take 1 Tablet By Mouth Once A Day 2)  Fluticasone Propionate 50 Mcg/act Susp (Fluticasone Propionate) ..Marland Kitchen. 1 Spray in Each Nostril  Every Morning and Every Evening 3)  Cvs Saline Nasal Spray 0.65 %  Soln (Saline) .... As Needed 4)  Mucinex 600 Mg  Tb12 (Guaifenesin) ..Marland Kitchen. 1-2 Tabs By Mouth Two Times A Day W/ Fluids..Marland KitchenMarland Kitchen5)  Advair Diskus 250-50 Mcg/dose Misc (Fluticasone-Salmeterol) .... Inhale 1 Puff Two Times A Day 6)  Amlodipine Besylate 5 Mg Tabs (Amlodipine Besylate) .... Take 1 Tablet By Mouth Once A Day 7)   Nexium 40 Mg Cpdr (Esomeprazole Magnesium) .... Take One Tablet By Mouth Once Daily 8)  Meloxicam 15 Mg Tabs (Meloxicam) .... Take 1 Tablet By Mouth Once A Day 9)  Centrum Silver   Tabs (Multiple Vitamins-Minerals) .... Take 1 Tablet By Mouth Once A Day 10)  Vitamin D 1000 Unit Tabs (Cholecalciferol) .... Take 1 Tablet By Mouth Once A Day 11)  Meclizine Hcl 25 Mg Tabs (Meclizine Hcl) .... Take 1 Tab By Mouth Every 6 H As Needed For Dizziness. 12)  Promethazine Vc/codeine 6.25-5-10 Mg/529mSyrp (Phenyleph-Promethazine-Cod) .... 1/2 -1 Tsp Every 4-6 Hr As Needed Cough , May Make You Sleepy 13)  Hydrocodone-Acetaminophen 5-500 Mg Tabs (Hydrocodone-Acetaminophen) .... Take 1/2 To 1 Tab By Mouth Every 6 H As Needed For Pain...  Current Medications (verified): 1)  Cvs Loratadine 10 Mg  Tabs (Loratadine) .... Take 1 Tablet By Mouth Once A Day 2)  Fluticasone Propionate 50 Mcg/act Susp (Fluticasone Propionate) ...Marland Kitchen 1 Spray in Each Nostril  Every Morning and Every Evening 3)  Cvs Saline Nasal Spray 0.65 %  Soln (Saline) .... As Needed 4)  Mucinex 600 Mg  Tb12 (Guaifenesin) ...Marland Kitchen 1-2 Tabs By Mouth Two Times A Day W/ Fluids...Marland KitchenMarland Kitchen)  Advair Diskus 250-50 Mcg/dose Misc (Fluticasone-Salmeterol) .... Inhale 1 Puff Two Times A Day 6)  Amlodipine Besylate 5 Mg Tabs (Amlodipine Besylate) .... Take 1 Tablet By Mouth Once A Day 7)  Nexium 40 Mg Cpdr (  Esomeprazole Magnesium) .... Take One Tablet By Mouth Once Daily 8)  Meloxicam 15 Mg Tabs (Meloxicam) .... Take 1 Tablet By Mouth Once A Day 9)  Centrum Silver   Tabs (Multiple Vitamins-Minerals) .... Take 1 Tablet By Mouth Once A Day 10)  Vitamin D 1000 Unit Tabs (Cholecalciferol) .... Take 1 Tablet By Mouth Once A Day 11)  Meclizine Hcl 25 Mg Tabs (Meclizine Hcl) .... Take 1 Tab By Mouth Every 6 H As Needed For Dizziness. 12)  Promethazine Vc/codeine 6.25-5-10 Mg/50m Syrp (Phenyleph-Promethazine-Cod) .... 1/2 -1 Tsp Every 4-6 Hr As Needed Cough , May Make You Sleepy 13)   Hydrocodone-Acetaminophen 5-500 Mg Tabs (Hydrocodone-Acetaminophen) .... Take 1/2 To 1 Tab By Mouth Every 6 H As Needed For Pain...  Allergies (verified): 1)  ! Celexa 2)  ! * Hydrocodone/apap 3)  ! * Droperidol 4)  ! * Clonedine 5)  ! * Benicar 6)  ! * Lotrel 7)  ! * Cyclobenzaprine 8)  ! * Inflammatory Medicines 9)  ! Amoxicillin 10)  ! Zithromax 11)  ! * Metoclopramide 12)  ! * Chlorzoxazone 13)  ! Toradol 14)  ! Morphine  Past History:  Past Medical History: Last updated: 04/24/2010 ALLERGIC RHINITIS (ICD-477.9) ASTHMA (ICD-493.90) HYPERTENSION (ICD-401.9) HYPERCHOLESTEROLEMIA, BORDERLINE (ICD-272.4) GERD (ICD-530.81) DIVERTICULOSIS OF COLON (ICD-562.10) IBS (ICD-564.1) COLONIC POLYPS (ICD-211.3) Hx of NEPHROLITHIASIS (ICD-592.0) OSTEOARTHRITIS (ICD-715.90) FIBROMYALGIA (ICD-729.1) VITAMIN D DEFICIENCY (ICD-268.9) POSTCONCUSSION SYNDROME (ICD-310.2) CERVICAL STRAIN, ACUTE (ICD-847.0) DIZZINESS (ICD-780.4) ANXIETY (ICD-300.00) PERSONAL HISTORY ALLERGY UNSPEC MEDICINAL AGENT (ICD-V14.9)  Past Surgical History: Last updated: 04/24/2010 S/P Lap Chole for gallstone pancreatitis in 2004 Cholecystectomy Thyroid Surgery  Family History: Last updated: 001-Sep-2009mother died age 7429from cancer and heart problems father died age 152from car accident 4 siblings 1 sister alive age 72 hx of breast cancer 110yrs ago 1 sister alive age 72 in good health 1 sister died age 7466from pancreatitis,etoh abuse 1 sibling living at 75with alcohol abuse, muscle and nerve disorder  Social History: Last updated: 04/24/2010 retired quit smoking at age 89--1969 divorced 1 child exercises 3x a week drinks 1 cup caffeine every other day no alcohol  Risk Factors: Smoking Status: quit < 6 months (04/24/2010)  Review of Systems      See HPI  Vital Signs:  Patient profile:   70year old female Height:      61 inches Weight:      180 pounds BMI:     34.13 O2 Sat:       97 % on Room air Temp:     96.7 degrees F oral Pulse rate:   78 / minute BP sitting:   130 / 74  (right arm) Cuff size:   regular  Vitals Entered By: JParke PoissonCNA/MA (July 11, 2010 11:17 AM)  O2 Flow:  Room air CC: poss shingles w/ right flank pain, itching under right breast x3weeks Is Patient Diabetic? No Comments Medications reviewed with patient Daytime contact number verified with patient. JParke PoissonCNA/MA  July 11, 2010 11:19 AM    Physical Exam  Additional Exam:  WD, WN, 70y/o WF in NAD... GENERAL:  Alert & oriented; pleasant & cooperative... HEENT:  /AT, EOM-wnl, PERRLA, EACs-clear, TMs-wnl, NOSE: clear  THROAT-clear & wnl. NECK:  Supple w/ fair ROM; no JVD; normal carotid impulses w/o bruits; no thyromegaly or nodules palpated; no lymphadenopathy. CHEST:  Coarse BS w/ no wheezing ... HEART:  Regular Rhythm; without murmurs/ rubs/ or gallops detected... ABDOMEN:  Soft & nontender; normal bowel sounds; no organomegaly or masses palpated., neg CVA tenderness  EXT: without deformities, mild arthritic changes; no varicose veins/ venous insuffic/ or edema. NEURO:  CN's intact; no focal neuro deficits... DERM:  No lesions noted; no rash etc... Musculoskeletal: tender along right low back , nml gait, equal strength of lower ext.    Impression & Recommendations:  Problem # 1:  BACK PAIN (ICD-724.5) Recurrent right low back pain: suspect is musculoskeletal in nature  Plan: xray pending  Alternate ice and heat to back  Mobic once daily for 5 days with food then as needed joint /back pain Vicodin 1/2-1 by mouth three times a day as needed severe pain ,may make you sleepy.  I will call with xray results.     Problem # 2:  ALLERGIC RHINITIS (ICD-477.9)   Stop loratadine Begin Zyrtec 50m at bedtime  Patanae  2puffs at bedtime until sample is gone.  Continue Fluticasone 2 puffs two times a day  Please contact office for sooner follow up if symptoms do not  improve or worsen  change advair to symbicort per insurance request  Her updated medication list for this problem includes:    Cvs Loratadine 10 Mg Tabs (Loratadine) ..Marland Kitchen.. Take 1 tablet by mouth once a day    Fluticasone Propionate 50 Mcg/act Susp (Fluticasone propionate) ..Marland Kitchen.. 1 spray in each nostril  every morning and every evening    Cvs Saline Nasal Spray 0.65 % Soln (Saline) ..Marland Kitchen.. As needed  Medications Added to Medication List This Visit: 1)  Symbicort 160-4.5 Mcg/act Aero (Budesonide-formoterol fumarate) .... 2 puffs two times a day  Other Orders: T-Thoracic Spine 2 Views [phone] T-Lumbar Spine 2 Views (72100TC) Est. Patient Level IV ((17616  Patient Instructions: 1)  Alternate ice and heat to back  2)  Mobic once daily for 5 days with food then as needed joint /back pain 3)  Vicodin 1/2-1 by mouth three times a day as needed severe pain ,may make you sleepy.  4)  I will call with xray results.  5)  Stop loratadine 6)  Begin Zyrtec 185mat bedtime  7)  Patanae  2puffs at bedtime until sample is gone.  8)  Continue Fluticasone 2 puffs two times a day  9)  Please contact office for sooner follow up if symptoms do not improve or worsen  10)  change advair to symbicort per insurance request

## 2010-08-10 ENCOUNTER — Telehealth: Payer: Self-pay | Admitting: Pulmonary Disease

## 2010-08-10 ENCOUNTER — Ambulatory Visit (INDEPENDENT_AMBULATORY_CARE_PROVIDER_SITE_OTHER): Payer: Medicare Other | Admitting: Adult Health

## 2010-08-10 ENCOUNTER — Encounter: Payer: Self-pay | Admitting: Adult Health

## 2010-08-10 VITALS — BP 134/66 | HR 77 | Temp 97.9°F | Ht 64.5 in | Wt 184.8 lb

## 2010-08-10 DIAGNOSIS — J45909 Unspecified asthma, uncomplicated: Secondary | ICD-10-CM

## 2010-08-10 MED ORDER — HYDROCODONE-HOMATROPINE 5-1.5 MG/5ML PO SYRP: 5.0000 mL | ORAL_SOLUTION | Freq: Four times a day (QID) | ORAL | Status: AC | PRN

## 2010-08-10 MED ORDER — CEFDINIR 300 MG PO CAPS: 300.0000 mg | ORAL_CAPSULE | Freq: Two times a day (BID) | ORAL | Status: AC

## 2010-08-10 NOTE — Telephone Encounter (Signed)
Called and spoke with pt and she c/o chest pain, cough w/ white phlem, hoarseness. Pt thinks she may have bronchitis. Pt been using her symbicort and flonase. Pt is coming in to see TP today at 2:45  [facility], [place]

## 2010-08-10 NOTE — Progress Notes (Signed)
  Subjective:    Patient ID: [name], female    DOB: 1940-07-02, 70 y.o.   MRN: [id number]  HPI 70year-old white female patient of Dr. [name] who has a known history of asthmatic bronchitis, hypertension, osteoarthritis and hyperlipidemia.   ~ April 24, 2010: 25moROV- c/o right side pain,worse in the eve, deep in flank area, no rash, she thought kid stone- eval DrOttelin 11/11 neg, Mobic helped some, exam w/ tenderness over floating ribs... otherw astma OK; BP remains stable; Chol rx w/ diet alone & wt down 8# to 180#; etc...   July 11, 2010 --Presents for work in visit for persistent back pain . She is worried she might have shingles but has not rash. Pt complains over last 3 month she has had right low back pain on/off . Seen last ov tx with back/rib binder, vicodin.She says she got better with only some mild intermittent discomfort.However over last couple of weeks back pain has restarted. She feels like skin is itchy at times esp around neck, breast and has drippy nose. for last few weeks. Has no associated urinary symptoms or fever .NO rash. No known injury. no ext weakness,or radicular symptoms.  Rx Mobic and Vicodin.   08/10/2010 -- Presents for an acute office visit. Complains of Cough, congestion , drainage, earache on/off for 2 weeks. Used Mucinex DM with some relief. Woke up this am with hoarseness, barking cough, pain with coughing. Mucus is white. Has had chillls, and malaise  Worried that this is going to get worse over weekend. Cough worse in early am and night.   Back is some better with Mobic , has not used Vicodin yet.  Review of Systems   Constitutional:   No  weight loss, night sweats,    HEENT:   No headaches,  Difficulty swallowing,  Tooth/dental problems,  Sore throat,                No sneezing, itching, ear ache, nasal congestion, post nasal drip,   CV:  No chest pain,  Orthopnea, PND, swelling in lower extremities, anasarca, dizziness, palpitations  GI  No  heartburn, indigestion, abdominal pain, nausea, vomiting, diarrhea, change in bowel habits, loss of appetite  Resp:   No coughing up of blood.  No change in color of mucus.  No wheezing.  No chest wall deformity  Skin: no rash or lesions.  GU: no dysuria, change in color of urine, no urgency or frequency.  No flank pain.  MS:  No joint pain or swelling.  No decreased range of motion.  No back pain.  Psych:  No change in mood or affect. No depression or anxiety.  No memory loss.      Objective:   Physical Exam Gen: Pleasant, well-nourished, in no distress,  normal affect  ENT: No lesions,  mouth clear,  oropharynx clear, no postnasal drip  Neck: No JVD, no TMG, no carotid bruits  Lungs: No use of accessory muscles, no dullness to percussion, coarse BS w/ barky cough   Cardiovascular: RRR, heart sounds normal, no murmur or gallops, no peripheral edema  Abdomen: soft and NT, no HSM,  BS normal  Musculoskeletal: No deformities, no cyanosis or clubbing  Neuro: alert, non focal  Skin: Warm, no lesions or rashes         Assessment & Plan:

## 2010-08-10 NOTE — Assessment & Plan Note (Addendum)
Exacerbation with URI -  Albuterol neb given in office  Plan ;  Omnicef 380m Twice daily  For 7 days-take w/ food  Mucinex DM Twice daily  As needed  For cough/congestion  Hydromet 1-2 tsp every 4-6 hr As needed  Cough  Claritin 175mAt bedtime  As needed  Drainage.  Please contact office for sooner follow up if symptoms do not improve or worsen or seek emergency care   follow up Dr. NaLenna GilfordAs planned and As needed

## 2010-08-10 NOTE — Patient Instructions (Addendum)
Omnicef 343m Twice daily  For 7 days-take w/ food  Mucinex DM Twice daily  As needed  For cough/congestion  Hydromet 1-2 tsp every 4-6 hr As needed  Cough  Claritin 135mAt bedtime  As needed  Drainage.  Fluids and rest  Tylenol As needed   Please contact office for sooner follow up if symptoms do not improve or worsen or seek emergency care   follow up Dr. NaLenna GilfordAs planned and As needed

## 2010-08-15 LAB — URINALYSIS, ROUTINE W REFLEX MICROSCOPIC
Glucose, UA: NEGATIVE mg/dL
Ketones, ur: NEGATIVE mg/dL
Protein, ur: NEGATIVE mg/dL
Urobilinogen, UA: 0.2 mg/dL (ref 0.0–1.0)

## 2010-08-15 LAB — URINE MICROSCOPIC-ADD ON

## 2010-08-17 ENCOUNTER — Telehealth: Payer: Self-pay | Admitting: Internal Medicine

## 2010-08-17 MED ORDER — PREDNISONE (PAK) 10 MG PO TABS: ORAL_TABLET | ORAL | Status: AC

## 2010-08-17 MED ORDER — TRAMADOL HCL 50 MG PO TABS: ORAL_TABLET | ORAL | Status: DC

## 2010-08-17 NOTE — Telephone Encounter (Signed)
Still coughing, on nexium   No purulent sputum  rec  Take delsym two tsp every 12 hours and supplement if needed with  tramadol 50 mg up to 2 every 4 hours to suppress the urge to cough. Swallowing water or using ice chips/non mint and menthol containing candies (such as lifesavers or sugarless jolly ranchers) are also effective.  You should rest your voice and avoid activities that you know make you cough.  Once you have eliminated the cough for 3 straight days try reducing the tramadol first,  then the delsym as tolerated.    Prednisone 10 mg take  4 each am x 2 days,   2 each am x 2 days,  1 each am x2days and stop   If condition worsens on this go to ER

## 2010-08-21 ENCOUNTER — Telehealth: Payer: Self-pay | Admitting: Pulmonary Disease

## 2010-08-21 MED ORDER — BUDESONIDE-FORMOTEROL FUMARATE 160-4.5 MCG/ACT IN AERO: 2.0000 | INHALATION_SPRAY | Freq: Two times a day (BID) | RESPIRATORY_TRACT | Status: DC

## 2010-08-21 NOTE — Telephone Encounter (Signed)
Pt states Symbicort sample is almost out and RX needs to be called to [facility] on [address]. RX sent.

## 2010-08-22 LAB — POCT I-STAT, CHEM 8
BUN: 21 mg/dL (ref 6–23)
BUN: 22 mg/dL (ref 6–23)
Chloride: 103 mEq/L (ref 96–112)
Creatinine, Ser: 1 mg/dL (ref 0.4–1.2)
Hemoglobin: 13.6 g/dL (ref 12.0–15.0)
Potassium: 3.5 mEq/L (ref 3.5–5.1)
Potassium: 4.1 mEq/L (ref 3.5–5.1)
Sodium: 137 mEq/L (ref 135–145)
Sodium: 137 mEq/L (ref 135–145)
TCO2: 25 mmol/L (ref 0–100)

## 2010-08-22 LAB — URINALYSIS, ROUTINE W REFLEX MICROSCOPIC
Ketones, ur: NEGATIVE mg/dL
Leukocytes, UA: NEGATIVE
Nitrite: NEGATIVE
pH: 5.5 (ref 5.0–8.0)

## 2010-08-22 LAB — CBC
HCT: 40.7 % (ref 36.0–46.0)
Platelets: 291 10*3/uL (ref 150–400)
RDW: 13.3 % (ref 11.5–15.5)
WBC: 9.5 10*3/uL (ref 4.0–10.5)

## 2010-08-22 LAB — DIFFERENTIAL
Basophils Absolute: 0.1 10*3/uL (ref 0.0–0.1)
Eosinophils Relative: 1 % (ref 0–5)
Lymphocytes Relative: 35 % (ref 12–46)
Lymphs Abs: 3.3 10*3/uL (ref 0.7–4.0)
Neutro Abs: 5.2 10*3/uL (ref 1.7–7.7)
Neutrophils Relative %: 55 % (ref 43–77)

## 2010-08-22 LAB — URINE CULTURE
Colony Count: 5000
Colony Count: NO GROWTH

## 2010-08-22 LAB — URINE MICROSCOPIC-ADD ON

## 2010-08-30 ENCOUNTER — Telehealth: Payer: Self-pay | Admitting: Pulmonary Disease

## 2010-08-30 NOTE — Telephone Encounter (Signed)
rx have been written for pt per SN.  These have been signed and placed up front for the pt to come and pick them up.  i called and lmom to make pt aware that rx would be up at the front counter waiting on pt to pick these up.

## 2010-08-30 NOTE — Telephone Encounter (Signed)
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   1) Loratadine 2)Saline Nasal Spray 3)Meclizine   With directions on use on the RX as well.    Pt is aware I am sending message to SN/Leigh to advise. Will call her when ready for pick up if SN agrees to write Rx's.

## 2010-09-17 ENCOUNTER — Telehealth: Payer: Self-pay | Admitting: Pulmonary Disease

## 2010-09-17 MED ORDER — MOMETASONE FURO-FORMOTEROL FUM 100-5 MCG/ACT IN AERO: INHALATION_SPRAY | RESPIRATORY_TRACT | Status: DC

## 2010-09-17 NOTE — Telephone Encounter (Signed)
Spoke w/ pt and she states she has been having tremors/shakiness and right hand going numb x 2 months. Pt states she believes this is due to her symbicort. Pt was changed from advair to symbicort in Feb. Pt states she can't remember if these symptoms started when she started the symbicort. Pt spoke w/ her pharmacy and they advised her that tremors and numbness could happen when taking the symbicort. Pt states she did not take symbicort yesterday and today and she did not have any tremors and numbness. Pt is wanting symbicort changed to something else. Please advise Dr. [name]. Thanks  Allergies  Allergen Reactions  . Amlodipine Besy-Benazepril Hcl     REACTION: fatique  . Amoxicillin     REACTION: pain in right kidney  . Azithromycin     REACTION: itching  . Benicar Hct (Olmesartan Medoxomil-Hctz)     Extreme weakness  . Celexa   . Chlorzoxazone (Parafon Forte Dsc)   . Citalopram Hydrobromide     REACTION: hives  . Clonidine Derivatives   . Droperidol     REACTION: hives  . Flexeril (Cyclobenzaprine Hcl)   . Ketorolac Tromethamine     REACTION: hives  . Morphine     REACTION: hives and itching  . Olmesartan Medoxomil     REACTION: fatique  . Reglan   . Toradol     Charma Igo, CMA

## 2010-09-17 NOTE — Telephone Encounter (Signed)
Spoke w/ pt and she is aware to stop symbicort and begin dulera. Pt verbalized understanding and had no questions. Pt aware rx was sent to pharmacy. Nothing further was needed

## 2010-09-17 NOTE — Telephone Encounter (Signed)
Per SN---stop the symbicort and start on dulera 100/5  #1   2 puffs bid and refill prn.  thanks

## 2010-09-25 ENCOUNTER — Telehealth: Payer: Self-pay | Admitting: Pulmonary Disease

## 2010-09-25 NOTE — Telephone Encounter (Signed)
Spoke with the pt and she states she started [place] on 09-17-10 and since then she has been having a persistent sore throat and trouble sleeping.; She states she has only been sleeping 1 hr a night since she started this medication.  Pt states she was on advair but her insurance stopped covering it and she had to try other medications. She has tried symbicort and it caused numbness and tremors so she was changes to [facility] on 09-17-10 and she has intolerance to this as well. Pt wants to go back on advair and have [place] do a PA for this medication stating she has tried and failed other meds. Please advise.Boody Bing, CMA

## 2010-09-25 NOTE — Telephone Encounter (Signed)
Pt called back again. Says nurse needs to call [phone]. Pt member # [id number]. Advise them that pt has tried the other meds but they didn't work (made her cough. Etc) - only advair works for her.

## 2010-09-26 MED ORDER — FLUTICASONE-SALMETEROL 250-50 MCG/DOSE IN AEPB: 1.0000 | INHALATION_SPRAY | Freq: Two times a day (BID) | RESPIRATORY_TRACT | Status: DC

## 2010-09-26 NOTE — Telephone Encounter (Signed)
Called [employer] at [phone] and got approved for the pt to change back to advair 250/50  Approved through 05-13-11.  Member id #  [id number].  Allergy list has been updated with the dulera and the symbicort.  Pt is aware of [facility]

## 2010-09-28 NOTE — Discharge Summary (Signed)
NAMEMAYTE, DIERS                        ACCOUNT NO.:  [id number]   MEDICAL RECORD NO.:  [id number]                   PATIENT TYPE:  INP   LOCATION:  5742                                 FACILITY:  [place]   PHYSICIAN:  Sandy Salaam. Deatra Ina, M.D. [facility]          DATE OF BIRTH:  [birth year]   DATE OF ADMISSION:  05/02/2003  DATE OF DISCHARGE:  05/06/2003                                 DISCHARGE SUMMARY   ADMISSION DIAGNOSES:  1. Gallstone pancreatitis.  2. Cholelithiasis, no evidence of cholecystitis or ductal dilatation.  3. Osteoarthritis with history of associated chronic pain.  Symptoms much     improved following voluntary weight loss over the past year or so.  4. Hypokalemia.  5. Hyperglycemia, rule out diabetes mellitus type 2.  6. History of hypertension.  7. Gastroesophageal reflux disease, status post upper endoscopy 2000 showing     erosive esophagitis.  Symptoms well controlled with Nexium.  8. History of colon polyps with colonoscopy and upper endoscopy in December     of 2000 findings of scattered diverticulosis and an adenomatous polyp was     removed, internal hemorrhoids were noted.  She is due for repeat     screening colonoscopy in 2003/2004 but has not yet pursued this.  9. History of questionable diverticulitis, patient denies this but it has     been documented in the chart.  Question whether this is accurate or not.  10.      Probable fibromyalgia.  11.      Status post partial thyroidectomy, possibly just removal of a     thyroid cyst, details not clear and patient is not on any thyroid hormone     replacement.  12.      History of asthma.  13.      Status post bilateral cataract extraction and lens implantation.  14.      Anxiety and ? history of depression.  15.      Status post bilateral knee surgery, twice on the left, once on the     right.  All of these done arthroscopically  16.      Allergic rhinitis.  17.      History of vertigo and history of  repeated inner ear infections as     child and young woman.  18.      Multiple medication ALLERGIES including:  ZITHROMAX,     METOCLOPRAMIDE/REGLAN, CELEXA, MORPHINE, LOTREL, HYDROCODONE, CODEINE,     DROPERIDOL, TORADOL, CLONIDINE, BENICAR.   DISCHARGE DIAGNOSES:  1. Biliary pancreatitis.  2. Status post ERCP per Dr. Erskine Emery April 14, 2003 with removal of     small common duct stone with moderate bleeding following sphincterotomy,     hemostasis obtained following epinephrine injection.  Pancreatic stent     was placed in order to allow for cannulation of the common bile duct.     This was left in place and ultimately  removed at endoscopy on May 06, 2003.  3. Cholelithiasis.  4. Status post uneventful laparoscopic cholecystectomy on May 05, 2003     by Dr. Judeth Horn.  5. Hyperglycemia.  Serum glucoses ranged between 100 and 148 during this     admission.  Will need to be addressed by her primary care physician for     dietary counseling and possible medication treatment.  6. Hypokalemia, corrected.   PRIMARY CARE PHYSICIAN:  Dr. Teressa Lower.   HISTORY OF PRESENT ILLNESS:  Briefly, the patient is a pleasant 70 year old  white female.  Her primary care physician is Dr. Teressa Lower.  She has a  long history of musculoskeletal pain and actually have fibromyalgia but in  the last six months she has been on a voluntary diet and has managed to lose  about 65 pounds and her musculoskeletal symptoms have markedly improved.  However, two weeks prior to this admission, she developed pain in the region  of the scapula that was increasing in intensity over time.  It was  exacerbated by activity and not associated with any gastrointestinal  symptoms such as nausea, vomiting or anorexia.  Gastroesophageal reflux  disease symptoms have been well controlled with Nexium.  The patient came to  the emergency room where labs revealed lipase of 146 and amylase of 287 and   concomitant elevations of her AST and ALT.  Ultrasound showed uncomplicated  cholelithiasis with no evidence for chronic cholecystitis.  Her white blood  cell count was mildly elevated.  She was admitted by Dr. Deatra Ina with a  diagnosis of mild gallstone pancreatitis.   The patient has had a prior colonoscopy and endoscopy as listed above and is  not up to date on follow up for her screening colonoscopy because of her  history of adenomatous polyps.   PROCEDURE:  1. ERCP.  2. Laparoscopic cholecystectomy as above.   CONSULTATIONS:  Georganna Skeans,  M.D. for evaluation of laparoscopic  cholecystectomy.  Ultimately the surgeon for the gallbladder removal was Dr.  Judeth Horn.   LABORATORY DATA:  White blood cell count 12.6, hemoglobin 14.6, hematocrit  42.4, platelet count 278,000.  Discharge white blood cell count was 11.4.  Pro Time 12.6, INR 0.9, PTT 28.  Sodium 139, potassium 2.7 corrected to 3.6.  Glucose ranged from 100 to 148.  BUN 12, creatinine 0.6.  Total bilirubin  0.6.  Alkaline phosphatase 100.  Maximum AST 336, 71 at discharge.  Maximum  ALT 430, 200 at discharge.  Albumin 4.1.  Amylase 287.  Lipase 146, 31 on  recheck.  Two view chest film on May 02, 2003,  no evidence for acute  cardiopulmonary disease.  There were some mild degenerative changes of the  thoracic spine.  Single view abdomen showed unremarkable bowel gas pattern  and the pancreatic duct stent was in place at that time.  This was performed  May 06, 2003.  Ultrasound of May 02, 2003 showing gallstones with  no associated findings to suggest acute cholecystitis.  Simple cyst seen in  the left kidney.   HOSPITAL COURSE:  The patient was admitted and placed on sips of clear  liquids.  Most of her oral medications were continued with the addition of  intravenous Demerol and Phenergan for pain and nausea management as needed.  She was not started on any antibiotics.  The following day her  transaminases had increased significantly.  Her lipase  had decreased.  She had not had  any fever and her white blood cell count had  normalized.  Hypokalemia had been corrected with oral potassium  supplementation.  The patient had ERCP on the morning of hospital day #2.  Cannulation of the common bile duct was challenging.  Dr. Deatra Ina therefore  placed a  pancreatic duct stent in order to ease the cannulation of the  common bile duct.  He did leave the stent in place after the ERCP as a  prophylaxis against post ERCP pancreatitis.  The patient did well following  the ERCP.  Surgical evaluation was obtained that day and she went to surgery  on hospital day #4.  She tolerated this procedure well and postoperative day  #1 the pancreatic stent was removed.  The patient was discharged to home in  stable and much improved condition.  She was tolerating solid foods at the  time of discharge.   Note that the patient's blood glucose was elevated when she arrived in the  emergency room and all serum glucoses showed mild to moderate elevation of  blood sugars.  This will need to be followed up in the office but no formal  education or medication regimen was started at this time.  She was, however,  alerted to the fact that her blood glucoses were elevated and that she  should avoid concentrated sweets and get plenty of moderate exercise if she  could tolerate this.   FOLLOW UP:  The surgeon's office was to contact the patient for follow up in  their office within the next couple of weeks.   DISCHARGE MEDICATIONS:  1. Vicodin one p.o. q.6h. PRN, #30 dispensed.  2. Hydrochlorothiazide 12.5 mg daily.  3. Tramidol 50 mg p.o. b.i.d. PRN.  4. Norvasc 5 mg p.o. daily.  5. Nexium 40 mg p.o. daily.  6. Tizanidine 4 mg at q.h.s.  7. Beconase nasal spray PRN.  She has not been using this due to cost and     not having any symptoms to require its' use.  The same is true for Advair     Diskus inhalers  which she has taken in the past but she has not had any     problems breathing and they are costly so she has not been using them.      Azucena Freed, P.A. [facility]                   Robert D. Deatra Ina, M.D. [facility]    SG/MEDQ  D:  06/02/2003  T:  06/03/2003  Job:  [id number]   cc:   Merri Ray. Grandville Silos, M.D.  [facility]  [address] [place], [place] [address]  Fax: [phone]   Judeth Horn III, M.D.  [phone] [address] [place]  [place] [address]  Fax: [phone]   [name]. [name], M.D. [facility]

## 2010-09-28 NOTE — H&P (Signed)
[facility]  Patient:    [name], [name]                     MRN: [id number] Adm. Date:  [id number] Attending:  Nicholaus Bloom CC:         Bo Merino, M.D.   History and Physical  NEW PATIENT EVALUATION  HISTORY:  [name] is a 70 year old who is sent to [place] by Dr. Bo Merino for evaluation of her pain from her osteoarthritis.  The patients referral was a bit confused, as it apparently was initiated through one of the administrators over at [facility] and it was difficult in arranging all the paperwork with regard to her.  The patient gives a history of articular complaints which date back to the early 1990s when she had surgery on her right knee in 1991 and her left in 1995.  At that time, she was having a lot of pain which was beginning to limit her.  She noted that her pain increased in severity up until 1998, when she was complaining of elbow, shoulder, hip, knee and ankle pain and was seen by Dr. Anson Oregon in July of 1998, where she was diagnosed as having osteoarthritis and probable secondary fibromyalgia.  She has been followed by Dr. Hedda Slade at [facility] and has had surgical interventions by him, as well as a trial of a multitude of nonsteroidals with minimal improvement.  Subsequent to her evaluation by Dr. Marveen Reeks, she was seen by Dr. Christian Mate. Hilts and underwent Hyalgan injections into her knees with no overall improvement.  She has ultimately gone to Dr. Estanislado Pandy, where she is being treated with Ultram 50 mg three times a day, which she states helps a lot, and Zanaflex 4 mg at night, which allows her about five hours of sleep.  She has been treated on glucosamine/chondroitin sulfate, which she does not feel is helping very much, and glucosamine cream, which she found not very helpful.  She lives alone and takes care of herself and is very concerned about independence issues. Dr. Estanislado Pandy has  discussed water therapy with her.  She has also had biofeedback in the past, which she is not actively using, and she has used splints on her hands and uses a walker to get about because of her knee discomfort.  The patient complains of joint swelling and pain in her wrists, ankles, knees and shoulders.  She has morning stiffness which last about 20 minutes.  She is able to get up and go for about two hours in the morning, then begins to wane in her strength.  She denies any psoriasis, butterfly rash, oral ulcers, eye problems such as iritis, diarrhea or inflammatory bowel disease, although she has a history of diverticular disease, or history of pleurisy or pericarditis. She apparently has laboratory investigations by Dr. Marveen Reeks which did not support an inflammatory arthropathy.  I am unfamiliar with her workup to date with Dr. Estanislado Pandy with regard to laboratory investigations.  She has had some thyroid surgery but she states she is not hypothyroid.  She does complain of a lot of dry skin.  She notes that her pain is made worse by use and improved by sitting as well as her splints.  CURRENT MEDICATIONS 1. Ultram three times a day. 2. Zanaflex 4 mg at night. 3. Zyrtec. 4. Beconase. 5. Glucosamine.  ALLERGIES:  ZITHROMAX, REGLAN, CELEXA, HYDROCODONE, DROPERIDOL, TORADOL, ______ .  She has taken MORPHINE in  the past when she had amputation of the distal tip of her left middle finger and she stated she would rather suffer the pain than the dysphoria she experienced.  FAMILY HISTORY:  Positive for asthma, COPD, cerebral hemorrhage and arthritis.  PAST SURGICAL HISTORY:  Significant for knee surgeries and partial thyroidectomy for a thyroid cyst.  SOCIAL HISTORY:  The patient quit smoking at the age of 41.  She does not drink alcohol.  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 She is on short-term disability per Dr. Estanislado Pandy.  ACTIVE MEDICAL PROBLEMS:   Gastroesophageal reflux disease, asthma, cataracts and her articular complaints.  REVIEW OF SYSTEMS:  GENERAL:  Negative.  HEAD:  Significant for allergic sinusitis.  EYES:  Significant for cataracts.  NOSE, MOUTH AND THROAT: Significant for sinus drainage.  EARS:  Significant for chronic earaches. PULMONARY:  Significant for asthma.  CARDIOVASCULAR:  Negative.  GI:  History of constipation and gastroesophageal reflux disease.  She often stays up at night with concerns of reflux.  GU:  Negative.  MUSCULOSKELETAL:  See HPI. NEUROLOGIC:  See HPI.  No history of seizure or stroke.  CUTANEOUS:  Negative. HEMATOLOGIC:  Negative.  ENDOCRINE:  Negative for thyroid disease, except for the thyroid nodule resected.  Negative for diabetes.  She has been on prednisone with a question of a polymyalgia rheumatica-type syndrome which apparently did not bear out.  She did note that the prednisone caused swelling of the supraclavicular fossa and over her neck and made her feel quite badly overall.  PSYCHIATRIC:  The patient has an anxiety disorder.  She lives alone. ALLERGIES:  Pollens, dust mites, redwoods and changes in the weather as well as perfumes.  PHYSICAL EXAMINATION  VITAL SIGNS:  Blood pressure is 165/80.  Heart rate is 90.  Respiratory rate is 16.  O2 saturation is 93%.  Pain level is 9/10.  HEENT:  Head was normocephalic, atraumatic.  Eyes:  Extraocular movements intact with conjunctivae and sclerae clear.  Nose:  Patent nares.  Oropharynx is free of mucosal lesions with teeth intact.  NECK:  Supple with evidence of previous thyroid surgery, with well-healed surgical scar.  Carotids are 2+ and symmetric.  I could not appreciate a lymphadenopathy.  BACK:  She did have a buffalo hump and supraclavicular fossa fullness.  She had some varicosities over the skin over the buffalo hump.  LUNGS:  Clear.  HEART:  Regular rate and rhythm.  She did have increased dorsal curvature of the  thoracic spine.  BREASTS:  Not performed.  PELVIC:  Not performed.   RECTAL:  Not performed.  ABDOMEN:  Bowel sounds are present with soft abdomen and tenderness to light palpation.  EXTREMITIES:  Radial pulses and dorsalis pedis pulses were 2+ and symmetric. MUSCULOSKELETAL:  The patient demonstrates some mild bony enlargement of the PIPs, DIPs, and first carpometacarpal joints of the hands with minimal evidence of any synovitic swelling.  Elbows and shoulders demonstrated good range of motion.  She had tenderness between the shoulder and elbow joint, inconsistent with a tender point of fibromyalgia, but did have some tenderness over the lateral epicondylar regions.  Anterior chest wall was tender.  She was tender over the temporalis muscles bilaterally.  The midpoint of the anterior thigh was tender to palpation as well.  She exhibited tenderness over the paraspinous muscles of the back, shoulders, thighs, over the gastrocnemius muscles, as well as over the anserine region and over the distal malleoli. She demonstrated  some bony enlargement of the first MTPs of the feet and to a moderate extent of the midtarsal bones.  Range of motion of her knees and hips were fairly intact, although she had some crepitus on range of motion of the knees.  NEUROLOGIC:  The patient was oriented x 4.  Cranial nerves II-XII were grossly intact.  Deep tendon reflexes were symmetric in the upper and lower extremities with downgoing toes.  Motor was 5/5 with symmetric bulk and tone. Sensory was intact to vibratory sense and scratch sense.  Coordination was grossly intact.  IMPRESSION  1. Osteoarthritis of the knees, small joints of the hands and feet.  2. Diffuse pain syndrome with some consistencies correlating with     fibromyalgia, but positive control points also.  3. Asthma.  4. Cushingoid appearance.  5. Anxiety disorder and I suspect an element of depression.  6. Cataracts.  7. Allergic  rhinitis.  8. Chronic earaches.  9. Gastroesophageal reflux disease. 10. History of diverticulitis.  DISPOSITION  1. I explained to the patient that it seems that she has a great deal of     intolerances to many medications and it seems like her current medical     regimen seems to be fairly well optimized.  Her inabilities to take     opiates in the past certainly does not enhance her changes of responding     positively now.  She has a long list of allergies.  I recommended that she     might consider increasing the dose of Ultram, provided she discusses this     with Dr. Estanislado Pandy to see if she is comfortable with that.  She is on     fairly low-dose Zanaflex but seems to be tolerating this and I am not     certain as to whether to push this up into a higher dose or not at this     time.  2. She does need to be more into a musculoskeletal conditioning program and     strongly encouraged her to go ahead and get involved with water therapy at     this time.  I advised her that she might want to call out to [facility] and see     whether they had any programs for fibromyalgia active at this point, as     they have had some in the past.  It will probably have to be tempered with     her osteoarthritis problems.  3. I recommended that she might want to try some apple cider vinegar with     honey, as there have been some reports on the radio that this might be of     some benefit and certainly was no less effective than trying the     glucosamine.  4. I encouraged her to continue on her other medications and to follow up     with Dr. Estanislado Pandy, as I explained to her her current presentation is a     syndrome and that one needs to be on constant vigil for other     inflammatory-type arthropathies occurring in the face of fibromyalgia-like     syndromes and the fact that she does have osteoarthritis.  I offered to     see her back in followup at Dr. Anette Guarneri request.  I encouraged her to      continue with her biofeedback; I also encouraged her to try and increase     her level of  activity so that she could maintain her independence.  5. I advised her that I had really very little more to offer her than what     Dr. Estanislado Pandy has at this point and this was really in her area of     expertise. DD:  03/07/00 TD:  03/07/00 Job: [id number]

## 2010-09-28 NOTE — Assessment & Plan Note (Signed)
[facility]                             PULMONARY OFFICE NOTE   ANANYA, MCCLEESE                     MRN:          [id number]  DATE:06/02/2006                            DOB:          02/15/1941    HISTORY OF PRESENT ILLNESS:  The patient is a 70 year old white female  patient of Dr. [name] who has a known history of asthmatic bronchitis,  hypertension, osteoarthritis and hyperlipidemia.  He presents for an  acute office visit.  The patient complains of a 2-week history of nasal  congestion, cough, hoarseness, ear pressure.  She was initially seen in  the office 2 weeks ago for acute upper respiratory infection, started on  Avelox and Medrol.  The patient reports she only took 3 days of the  Avelox because she felt like it made her nervous.  She finished her  Medrol Dosepak.  Symptoms did improve, however she continues to have  hoarseness, ear pressure, nasal congestion and post nasal drip.  The  patient denies any purulent sputum, fever, hemoptysis, orthopnea, PND or  leg swelling.  The patient also is fasting today and wishes to have her  cholesterol checked.   PAST MEDICAL HISTORY:  Reviewed.   CURRENT MEDICATIONS:  Reviewed.   PHYSICAL EXAMINATION:  The patient is a pleasant female in no acute  distress.  She is afebrile with stable vital signs.  O2 saturations 96%  on room air.  HEENT:  Nasal mucosa is slightly pale.  Nontender sinuses.  Conjunctivae  not injected.  TMs are normal.  NECK:  Supple without adenopathy.  No JVD.  LUNGS:  Lung sounds are clear without any wheezing or crackles.  CARDIAC:  S1, S2 without murmur, rubs or gallops.  ABDOMEN:  Soft and nontender.  EXTREMITIES:  Warm without any edema.   IMPRESSION AND PLAN:  1. Acute upper respiratory infection/rhinitis.  The patient is to add      Mucinex DM twice daily.  Saline nasal spray p.r.n. and increase her      flunisolide nasal spray 2 puffs twice daily.  The patient  is to      continue on Claritin daily.  Follow back up with Dr. [name] as      scheduled or sooner if needed.  2. Hyperlipidemia.  Fasting lipid panel is pending today.  Patient is      to continue on low fat, low cholesterol diet.      Rexene Edison, NP  Electronically Signed      [name]. [name], MD  Electronically Signed   TP/MedQ  DD: 06/03/2006  DT: 06/03/2006  Job #: [id number]

## 2010-09-28 NOTE — Consult Note (Signed)
[name], RIGA                        ACCOUNT NO.:  [id number]   MEDICAL RECORD NO.:  [id number]                   PATIENT TYPE:  INP   LOCATION:  5742                                 FACILITY:  [facility]   PHYSICIAN:  Merri Ray. Grandville Silos, M.D.             DATE OF BIRTH:  [birth year]   DATE OF CONSULTATION:  DATE OF DISCHARGE:                                   CONSULTATION   REFERRING PHYSICIAN:  Dr. Sandy Salaam. Deatra Ina.   REASON FOR CONSULTATION:  _________ mildly elevated.  To follow up, her  lipase was down to 31 yesterday but her AST and ALT, which were not  initially very high, were quite elevated.  Dr. Deatra Ina is planning ERCP  around 10 a.m. on May 04, 2003 and I was asked to evaluate for a  cholecystectomy following the ERCP.  Apparently, the patient tolerated lunch  and dinner today.  She has no gastric pain and she claims to be feeling  significantly better than on admission.  She did limit her p.o. intake  though for fear of having some recurrent pain.   PAST MEDICAL HISTORY:  1. Osteoarthritis.  2. Fibromyalgia.  3. Gastroesophageal reflux disease.  4. Hypertension.  5. Diverticulosis.  6. Anxiety.   PAST SURGICAL HISTORY:  1. Cataract surgery.  2. Thyroidectomy for a thyroid cyst.  3. Knee surgeries x3.   MEDICATIONS:  Medications currently include Demerol, Phenergan, Protonix, GI  cocktail, Norvasc.   ALLERGIES:  Allergies include ZITHROMAX, REGLAN, CELEXA, MORPHINE,  HYDROCODONE, DROMPERIDONE, TORADOL, CLONIDINE, BENICAR.   SOCIAL HISTORY:  She smoked in the distant past, she does not smoke now and  she does not drink alcohol.   REVIEW OF SYSTEMS:  GENERAL:  In general, she feels a lot better than when  she came in.  CARDIOVASCULAR:  Negative.  PULMONARY:  Negative.  GI:  See  the history of present illness.  GU:  Negative.  MUSCULOSKELETAL:  Multiple  knee surgeries and chronic pain and arthritis.   PHYSICAL EXAMINATION:  VITAL SIGNS:  Temperature  is 97.4, blood pressure  131/76, heart rate 62, respirations 20.  GENERAL:  She is resting comfortably.  HEENT:  Pupils are equal and reactive.  Sclerae are nonicteric.  LUNGS:  Lungs are clear to auscultation bilaterally.  HEART:  Heart is regular rate and rhythm.  PMI is palpable in the left  chest.  Distal pulses are 2+ and equal bilaterally.  ABDOMEN:  Her abdomen is soft with no appreciable tenderness in the right  upper quadrant or epigastrium.  No masses are noted.  SKIN:  Skin is warm and dry with no rashes.  EXTREMITIES:  Extremities have no significant edema.   DATA REVIEW:  Data review includes white blood cell count 6.3, hemoglobin  13.2, platelets 247,000.  Basic metabolic panel is within normal limits.  AST 336, ALT 430, alkaline phosphatase 93, bilirubin 0.7, lipase 31 today.   Ultrasound  of the abdomen demonstrated gallstones with no evidence of acute  cholecystitis.   IMPRESSION:  Biliary pancreatitis and possible choledocholithiasis.  The  patient is to have endoscopic retrograde cholangiopancreatogram by Dr.  Deatra Ina around 10 a.m. this morning.   PLAN:  I will discuss with my partners regarding cholecystectomy prior to  the patient's discharge home.  I am on vacation starting late this morning  until May 10, 2003, so I will discuss her with some of my partners who  will be here over the next several days to get this taken care of.   Thank you very much for this consultation.  The plan was discussed in detail  with the patient and questions were answered.                                               Merri Ray Grandville Silos, M.D.    BET/MEDQ  D:  05/04/2003  T:  05/05/2003  Job:  [id number]

## 2010-09-28 NOTE — Op Note (Signed)
[facility]  Patient:    [name]                      MRN: [id number] Proc. Date: 04/20/99 Adm. Date:  [id number] Attending:  [place] Cellar CC:         [name]. [name], M.D. [facility]                           Operative Report  PROCEDURE:  Upper endoscopy.  INDICATION:  [name] has long-standing reflux symptoms consisting of burning chest discomfort.  Test was performed for further evaluation.  This has followed her colonoscopy and no further medications were given.  DESCRIPTION OF PROCEDURE:  The Olympus video gastroscope was inserted under direct vision into the oropharynx and esophagus after spraying the throat with cetacaine spray.  In the distal esophagus there were several linear erosions at the GE junction.  Small hiatal hernia.  Normal stomach and duodenum.  IMPRESSION:  Erosive esophagitis.  RECOMMENDATIONS:  PPI therapy. DD:  04/20/99 TD:  04/21/99 Job: [id number]

## 2010-09-28 NOTE — Op Note (Signed)
[name], [name]                        ACCOUNT NO.:  [id number]   MEDICAL RECORD NO.:  [id number]                   PATIENT TYPE:  INP   LOCATION:  5742                                 FACILITY:  [place] [facility]   PHYSICIAN:  Judeth Horn III, M.D.               DATE OF BIRTH:  04-Dec-1940   DATE OF PROCEDURE:  05/05/2003  DATE OF DISCHARGE:  05/06/2003                                 OPERATIVE REPORT   PREOPERATIVE DIAGNOSIS:  Gallstone pancreatitis and symptomatic  cholelithiasis.   POSTOPERATIVE DIAGNOSIS:  Gallstone pancreatitis and symptomatic  cholelithiasis.   PROCEDURE:  Laparoscopic cholecystectomy.   SURGEON:  Judeth Horn, M.D.   ASSISTANT:  Sharyn Dross, R.N.   ANESTHESIA:  General endotracheal anesthesia.   ESTIMATED BLOOD LOSS:  Less than 20 mL.   COMPLICATIONS:  None.   INDICATIONS FOR PROCEDURE:  The patient is a 70 year old with gallstone  pancreatitis who underwent an ERCP with sphincterotomy and stent placement  yesterday who now comes in for laparoscopic cholecystectomy.   FINDINGS:  At the time of surgery the patient was  found to have what  appeared to be normal anatomy with excellent length on her cystic duct. The  cystic artery was in the triangle of Calot.   OPERATION:  The patient was taken to the operating room and placed on the  table in the supine position. After an adequate endotracheal anesthetic was  administered, she was prepped and draped in the usual sterile manner,  exposing the midline in the right upper quadrant.   A supraumbilical curvilinear incision was made using a #11 blade and was  taken down through the midline fascia. It was through this fascia that  a  Veress needle  was passed into the peritoneal cavity while tenting up on the  anterior abdominal wall with sharp towel clamps. We confirmed position of  the Veress needle  using the saline test and subsequently intubated with CO2  gas up to a maximum intraabdominal pressure of 50  mmHg.   Once this was done, an Optiview cannula and trocar was used with the  laparoscope in order to penetrate through the midline fascia down through  the peritoneum into the peritoneal cavity under direct vision. We removed  the internal trocar of the umbilical cannula and subsequently passed 2 right  subcostal margin 5-mm cannulas in the subxiphoid, 11-12-mm cannula under  direct vision using the nurse assistant. The patient was placed in the  reverse Trendelenburg position, the left side was tilted down and the  dissection begun.   There were adhesions to the body and infundibulum of the gallbladder which  were bluntly taken down using the dissector. We then retracted  the  infundibulum as the dome was retracted towards the anterior abdominal wall  in the right upper quadrant. The triangle of Calot was isolated along with  the peritoneum overlying the hepatoduodenal triangle.   Once this was done  we dissected out the cystic duct and the cystic artery  and doubly clipped the artery proximally  and distally. We also cauterized  it. We isolated the cystic duct which was subsequently singly clipped along  the gallbladder side and then triply clipped it distally. We transected the  cystic duct and then dissected out the gallbladder  from its bed with  minimal difficulty without any entrance into the gallbladder  itself. We  subsequently removed it from the supraumbilical  site with minimal  difficulty. We then irrigated with 1 to 1.5 liters of warm saline solution  and found there to be minimal bleeding from the hepatic bed which was  cauterized.   We subsequently removed all cannulas. We repaired the supraumbilical  fascia  using a figure-of-8 stitch of 0 Vicryl passed under the __________ needle.  We then closed the skin at all sites using a running subcuticular stitch of  4-0 Vicryl. Then 0.25% Marcaine with epinephrine was injected at all sites.  Sterile dressings were applied to  all wounds.   All sponge, instrument and needle counts were correct. She was taken to the  recovery room.                                               Rikki Spearing, M.D.    JW/MEDQ  D:  05/05/2003  T:  05/07/2003  Job:  [id number]

## 2010-09-28 NOTE — H&P (Signed)
NAMEEVOLETTE, PENDELL                        ACCOUNT NO.:  [id number]   MEDICAL RECORD NO.:  [id number]                   PATIENT TYPE:  INP   LOCATION:  1844                                 FACILITY:  [facility]   PHYSICIAN:  Sandy Salaam. Deatra Ina, M.D. [facility]          DATE OF BIRTH:  1941-05-13   DATE OF ADMISSION:  05/02/2003  DATE OF DISCHARGE:                                HISTORY & PHYSICAL   REASON FOR ADMISSION:  Two weeks of scapular region back pain, now increased  in intensity with additional esophageal pain this morning.   HISTORY OF PRESENT ILLNESS:  Dr. [name] asked Dr. Deatra Ina to evaluate this  patient who presented to the emergency room with the above symptoms.  She  has a long history of musculoskeletal pain, but actually in the last six  months since she has been able to lose 65 pounds, her musculoskeletal  symptoms have improved.  But, she has noticed mid back pain, left-sided  greater than right for about two weeks.  This is worsened by activity.  It  has not been associated with any GI symptoms such as nausea, vomiting, or  anorexia.  This morning, she had the back pain, and additionally she  developed pain in her epigastric and chest region which felt very much like  her reflux symptoms.  Generally, reflux symptoms are quite well controlled  with Nexium, but not as well controlled on other proton pump inhibitors  which she has been tried on in the past.   The patient presented to the emergency room and was evaluated by emergency  room physician.  Her lipase turned out to be 146, amylase 287, minimal  elevations of her AST and ALT were noted.  She did have gallstones without  gallbladder wall thickening or ductal dilatation on her ultrasound.  White  blood cell count is mildly elevated.  Dr. [name] was notified and asked Dr.  Deatra Ina to evaluate her, and Dr. Deatra Ina will admit the patient with what  looks to be a mild gallstone pancreatitis.   The patient has had prior  colonoscopy and endoscopy in December 2000.  Below, she had scattered diverticulosis and an adenomatous polyp was  removed, and internal hemorrhoids were also noted.  She is actually due for  a colonoscopy around 2003 or 2004, but has not had repeat colonoscopy yet.  Upper endoscopy in 2000, showed erosive esophagitis and again she had reflux  symptoms well controlled with Nexium.   PAST MEDICAL HISTORY:  1. Osteoarthritis.  2. Probable fibromyalgia.  3. Status post partial thyroidectomy or just possibly just removal of a     thyroid cyst.  4. History of asthma.  5. Status post bilateral cataract extraction and lens implantation.  6. Gastroesophageal reflux disease.  7. Diverticulosis.  8. Anxiety and question of depression.  9. Chronic pain.  10.      Status post bilateral knee surgery, twice on the left and  once on     the right, but all of these were arthroscopic.  11.      Chronic pain issues related to musculoskeletal pain.  12.      Allergic rhinitis.  13.      Vertigo and a history of repeated inner ear infections as a child     and young woman.  14.      Allergies multiple.  These include ZITHROMAX,     METOCLOPRAMIDE/REGLAN, CELEXA, MORPHINE, LOTREL, HYDROCODONE, CODEINE,     DROPERIDOL, TORADOL, CLONIDINE, BENICAR.  15.      Hypertension.   CURRENT MEDICATIONS:  1. Hydrochlorothiazide 12.5 mg daily.  2. Tramadol 50 mg p.o. b.i.d. p.r.n.  She has actually not been using a lot     of this.  3. Norvasc 5 mg daily.  4. Nexium 40 mg daily.  5. Tizanidine 4 mg at h.s.  6. Beconase nasal spray and Advair discus inhalers are medications which she     has not been able to afford for two months, but she has not had ENT or     pulmonary sequela requiring use of these medications.   SOCIAL HISTORY:  The patient has been disabled owing to musculoskeletal and  pain issues for the last three years.  She lives alone in senior citizen  housing apartment in [place].  She has been  divorced for 30 years.  She  has one son in [place] who is due to come into [place] in a couple of  days.  She smoked from age 64 to 50.  Does not consume alcoholic beverages.   FAMILY HISTORY:  Maternal grandmother had her gallbladder removed.  She has  a sister who has had breast cancer.  Mother had ulcer disease and general  stomach troubles.  A couple of sisters have had lung problems.  Sounds like  asthma, may be COPD.  Her father died secondary to motor vehicle accident.   REVIEW OF SYSTEMS:  INFECTIOUS DISEASE:  No fevers, chills, or sweats.  GASTROINTESTINAL:  No history of diverticulitis, though this has been noted  in prior charts, but she denies this, just the diverticulosis.  GERD  symptoms controlled with Nexium.  Bowel habits unchanged.  No bloody stool,  no black stools.  ENDOCRINE:  Through dietary efforts she has managed to  lose 64 pounds in the last six months.  She has also been doing some mild  exercise, and she feels much better with reduced musculoskeletal symptoms  and general overall a sense of increased well-being after this weight loss.  She does say that she has had some elevations of blood sugar in the past,  but has never been treated with oral agents or insulin.  ORTHOPAEDIC/MUSCULOSKELETAL:  Again, the decrease in her chronic pain after  her weight loss.  Does not use NSAIDS.  Does not tolerate NSAIDS.  HEMATOLOGIC:  No unusual bleeding.  No significant bruising.  UROLOGY:  No  bladder incontinence, no dysuria.  PULMONARY:  Has not had any flares of  asthma, cough, or shortness of breath despite being off her Beconase and  Advair for more than two months.  Note, that she ran out of these  medications and did not refill them because they were too expensive, and  again she did not have symptoms requiring their use.  REPRODUCTIVE:  The  patient is a bit behind on mammograms.  She was supposed to of had a repeat mammogram in spring of 2004, but has  not  had it yet.   PHYSICAL EXAMINATION:  GENERAL:  The patient is a pleasant, yet drowsy white  female.  She is a good historian.  VITAL SIGNS:  Temperature is 98, blood pressure 130/62, pulse 69,  respirations are 20.  Weight not yet obtained.  HEENT:  Negative pallor, negative scleral icterus.  Extraocular movements  intact.  Oropharynx:  Mucous membranes are somewhat dry, but no lesions,  exudates present.  NECK:  There is a well-healed surgical scar at the base of the neck, no  masses, no JVD, no bruits.  CHEST:  Clear to auscultation and percussion bilaterally.  No resting  shortness of breath.  No cough.  CARDIAC:  There is regular rate and rhythm, no murmurs, rubs, or gallops  appreciated.  ABDOMEN:  There is mild to moderate tenderness in the epigastrium and  bilateral upper quadrants.  No hepatosplenomegaly, no masses, no bruits.  RECTAL:  Deferred.  GENITOURINARY:  Deferred.  BREASTS:  Deferred.  EXTREMITIES:  No cyanosis, clubbing, or edema.  Dorsalis pedal pulses are 3+  bilaterally.  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  No  jaundice, no obvious bruising.  NEUROLOGIC:  Speech is a bit pressured, but she is not confused.  She is  alert and oriented x3.  PSYCHIATRIC:  Affect is within normal limits, maybe a little bit anxious.   LABORATORY DATA:  White blood cell count is 12.6.  Hemoglobin 14.6,  hematocrit 42.4, MCV 85.1.  Platelets 278,000.  Sodium is 139, potassium is  2.7, chloride 103, carbon dioxide 30, glucose 148, BUN is 12, creatinine  0.6.  Total bilirubin 0.6, alkaline phosphatase 100.  AST 69, ALT 42.  Amylase 287, lipase is 146.   IMPRESSION:  1. Gallstone pancreatitis.  2. Uncomplicated gallstones with no evidence for cholecystitis or ductal     dilatation on ultrasound today.  3. Osteoarthritis with history of chronic pain.  Overall, symptoms much     improved following voluntary weight loss.  4. Hypokalemia.  5.  Hyperglycemia, rule out type 2 diabetes mellitus.  6. History of hypertension.  7. Gastroesophageal reflux disease.  8. History of colon polyps with the patient due for repeat colonoscopy in     2003/2004.  9. History of diverticulosis, but no history of diverticulitis despite E-     chart documentation that she has had this.   PLAN:  The patient is admitted to a non-telemetry bed.  We will rest her  bowels and allow her clear liquids for the time being and give her an  intravenous solution of 1/2 normal saline with added potassium.  Also, we  will supplement with oral potassium.  I plan to check a CMET tomorrow as  well as a repeat lipase.  We will get a surgical consult as well because the  patient's gallbladder should come out either this admission or electively  following discharge within the next few weeks.      Azucena Freed, P.A. [facility]                   Robert D. Deatra Ina, M.D. [facility]   SG/MEDQ  D:  05/02/2003  T:  05/02/2003  Job:  [id number]

## 2010-09-28 NOTE — Assessment & Plan Note (Signed)
[facility] OFFICE NOTE   LEONIA, HEATHERLY                     MRN:          [id number]  DATE:03/18/2006                            DOB:          October 25, 1940    PROBLEM:  Reflux.   Ms. Test has returned for an annual checkup.  On daily omeprazole 20 mg  she is symptom-free.  She is without GI complaints.   PHYSICAL EXAMINATION:  Pulse 68, blood pressure 110/70, weight 157.   IMPRESSION:  Gastroesophageal reflux disease - well controlled on daily  omeprazole.   RECOMMENDATIONS:  Continue with the same.     Sandy Salaam. Deatra Ina, MD,FACG  Electronically Signed    RDK/MedQ  DD: 03/18/2006  DT: 03/19/2006  Job #: [id number]

## 2010-09-28 NOTE — Op Note (Signed)
[facility]  Patient:    [name]                      MRN: [id number] Proc. Date: 04/20/99 Adm. Date:  [id number] Attending:  [place] CC:         [name]. [name], M.D. [facility]                           Operative Report  PROCEDURE:  Colonoscopy.  INDICATION:  [name] has suffered from intermittent rectal bleeding.  Test as performed for further evaluation.  INFORMED CONSENT:  The patient provided consent after risks, benefits, and alternatives were explained.  PREMEDICATION:  Fentanyl 100 mcg and Versed 10 mg IV.  DESCRIPTION OF PROCEDURE:  The patient was placed in the left lateral decubitus  position and administered continuous low flow oxygen and placed on pulse oximetry.  The Olympus video colonoscope was inserted into the cecum which was identified y the appendiceal orifice.  Prep was good.  Scope was then withdrawn and all areas of the colon were examined.  In the rectal vault, on retroflex view, there were several internal hemorrhoids. At approximately 18 cm from the anus, corresponding to the distal sigmoid, there was a sessile 3 mm polyp.  This was removed via hot biopsy forceps.  There were  rare scattered sigmoid diverticuli.  The remainder of the colon was normal including descending, transverse, ascending colon, and cecum.  IMPRESSION: 1. Colon polyp. 2. Internal hemorrhoids. 3. Diverticulosis.  RECOMMENDATIONS:  Follow-up colonoscopy in three years if adenomatous changes are seen. DD:  04/20/99 TD:  04/21/99 Job: [id number]

## 2010-10-23 ENCOUNTER — Ambulatory Visit (INDEPENDENT_AMBULATORY_CARE_PROVIDER_SITE_OTHER): Payer: Medicare Other | Admitting: Pulmonary Disease

## 2010-10-23 ENCOUNTER — Other Ambulatory Visit: Payer: Self-pay | Admitting: Pulmonary Disease

## 2010-10-23 ENCOUNTER — Encounter: Payer: Self-pay | Admitting: Pulmonary Disease

## 2010-10-23 ENCOUNTER — Other Ambulatory Visit (INDEPENDENT_AMBULATORY_CARE_PROVIDER_SITE_OTHER): Payer: Medicare Other

## 2010-10-23 DIAGNOSIS — D126 Benign neoplasm of colon, unspecified: Secondary | ICD-10-CM

## 2010-10-23 DIAGNOSIS — K5730 Diverticulosis of large intestine without perforation or abscess without bleeding: Secondary | ICD-10-CM

## 2010-10-23 DIAGNOSIS — K219 Gastro-esophageal reflux disease without esophagitis: Secondary | ICD-10-CM

## 2010-10-23 DIAGNOSIS — I10 Essential (primary) hypertension: Secondary | ICD-10-CM

## 2010-10-23 DIAGNOSIS — E785 Hyperlipidemia, unspecified: Secondary | ICD-10-CM

## 2010-10-23 DIAGNOSIS — F411 Generalized anxiety disorder: Secondary | ICD-10-CM

## 2010-10-23 DIAGNOSIS — J309 Allergic rhinitis, unspecified: Secondary | ICD-10-CM

## 2010-10-23 DIAGNOSIS — M1990 Unspecified osteoarthritis, unspecified site: Secondary | ICD-10-CM

## 2010-10-23 DIAGNOSIS — K589 Irritable bowel syndrome without diarrhea: Secondary | ICD-10-CM

## 2010-10-23 DIAGNOSIS — J45909 Unspecified asthma, uncomplicated: Secondary | ICD-10-CM

## 2010-10-23 DIAGNOSIS — Z1231 Encounter for screening mammogram for malignant neoplasm of breast: Secondary | ICD-10-CM

## 2010-10-23 DIAGNOSIS — IMO0001 Reserved for inherently not codable concepts without codable children: Secondary | ICD-10-CM

## 2010-10-23 LAB — BASIC METABOLIC PANEL
Chloride: 106 mEq/L (ref 96–112)
Potassium: 4.9 mEq/L (ref 3.5–5.1)

## 2010-10-23 LAB — LDL CHOLESTEROL, DIRECT: Direct LDL: 148.9 mg/dL

## 2010-10-23 LAB — LIPID PANEL
Total CHOL/HDL Ratio: 5
VLDL: 37.4 mg/dL (ref 0.0–40.0)

## 2010-10-23 LAB — CBC WITH DIFFERENTIAL/PLATELET
Basophils Absolute: 0.1 10*3/uL (ref 0.0–0.1)
Basophils Relative: 0.9 % (ref 0.0–3.0)
Eosinophils Absolute: 0.1 10*3/uL (ref 0.0–0.7)
MCHC: 34.3 g/dL (ref 30.0–36.0)
MCV: 85.8 fl (ref 78.0–100.0)
Monocytes Absolute: 0.8 10*3/uL (ref 0.1–1.0)
Neutrophils Relative %: 43.9 % (ref 43.0–77.0)
Platelets: 342 10*3/uL (ref 150.0–400.0)
RDW: 13.5 % (ref 11.5–14.6)

## 2010-10-23 LAB — HEPATIC FUNCTION PANEL
ALT: 26 U/L (ref 0–35)
AST: 32 U/L (ref 0–37)
Alkaline Phosphatase: 98 U/L (ref 39–117)
Bilirubin, Direct: 0.1 mg/dL (ref 0.0–0.3)
Total Bilirubin: 0.7 mg/dL (ref 0.3–1.2)

## 2010-10-23 NOTE — Progress Notes (Signed)
Subjective:    Patient ID: [name], female    DOB: 1941/01/18, 70 y.o.   MRN: [id number]  HPI [name] here for a follow up visit... he has multiple medical problems as noted below...     ~  July 19, 2009:  c/o incr asthma symptoms- chest heavy, SOB, noct cough, sneezing... she's only been doing the Advair Qam & we discussed incr to Bid + Depo80 & Dosepak... also c/o decr memory but she wants to wait on meds...  ~  October 24, 2009:  49moROV & improved using the Advair two times a day... no recent flairs, breathing good, denies cough/ phlegm/ etc... BP controlled on Norvasc;  still trying to control Chol on diet alone- weight= 188#, down 2#;  we discussed diet + exercise but she notes more trouble walking & we will refer to DrOlin to check her knee &  that she can't do water exercise due to "allergy attacks" in the pool...   ~  April 24, 2010:  654moOV- c/o right side pain,worse in the eve, deep in flank area, no rash, she thought kid stone- eval DrOttelin 11/11 neg, Mobic helped some, exam w/ tenderness over floating ribs... otherw astma OK;  BP remains stable;  Chol rx w/ diet alone & wt down 8# to 180#;  etc...  ~  October 23, 2010:  67m667moV & she notes persist mild back discomfort> she had XRays 2/12 w/ advanced DJD in TSpine & scoliosis & spondylosis in LSspine; treated w/ Mobic/ Tramadol but she has hx mult medication reactions & prefers Tylenol & an occas Vicodin prn; she says moving about after arising really helps & symptoms aren't bad enough for ortho referral yet, but she will let us [place]...    BP remains stable on Norvasc; AB under control back on Advair; Nexium really helps her GERD symptoms; she is due for fasting blood work today> FLP looks worse on her diet & I rec refer to Lipid clinic...   Problem List:    ALLERGIC RHINITIS (ICD-477.9) - she uses OTC Claritin, Saline and FLONASE... she really likes the "netti pot"...  ASTHMA (ICD-493.90) - stable on ADVAIR 250Bid... Intol  Dulera w/ sore throat & insomnia. ~  CXR 3/11 showed hypoaeration, basilar atx, NAD. ~  CXR 10/11 showed clear, DJD spine, NAD.  HYPERTENSION (ICD-401.9) - controlled on NORVASC 5mg25m.. BP today = 140/80 & BP's even better at home... She is also rec to take ASA 81mg50m. denies HA, visual changes, CP, palipit, syncope, dyspnea, edema, etc...  HYPERCHOLESTEROLEMIA, BORDERLINE (ICD-272.4) - on diet + exercise, doesn't want meds. ~  FL[place] showed TChol 220, TG 279, HDL 43, LDL 122 ~  FLP 6/10 showed TChol 204, TG 167, HDL 43, LDL 123... needs better diet, get wt down. ~  FLP 6/11 showed TChol 221, TG 385, HDL 40, LDL 116... must get wt down! discussed low chol, low fat. ~  FLP 6/12 showed TChol 231, TG 187, HDL 44, LDL 149... Looks worse despite diet efforts, refer to Lipid Clinic.  GERD (ICD-530.81) - prev on Protonix but insurance won't pay, therefore switch to [place]/31m ?Omep caused diarrhea. ~  last EGD 9/06 showed esophagitis...  Hx Gallstones -  s/p ERCP w/ sphincterotomy 12/04... and subsequent cholecystectomy...  DIVERTICULOSIS OF COLON (ICD-562.10) IBS (ICD-564.1) COLONIC POLYPS (ICD-211.3) ~  colonoscopy 12/00 showed divertics, hems, colon polyp- adenomatous...  ~  f/u colonoscopy 3/10 by DrKaplan w/ polyp removed- adenomatous, f/u planned 5  yrs.  Hx of NEPHROLITHIASIS (ICD-592.0) - went to ER 4/10 w/ left flank pain & CT showed mild hydroneph & kidney stone... passed it on her own & followed by Urology- DrOttelin... seen 11/11 w/ right flank pain & neg eval (exam w/ tender ribs on palp),  OSTEOARTHRITIS (ICD-715.90) - she has some left hip & R>L knee discomfort...  uses Tylenol for pain... she notes prev right knee arthroscopy in 1999 by DrRendall... Rx w/ VICODIN Prn. ~  6/11:  we discussed ortho referral & will set up refer to DrOlin per request. ~  6/12:  She notes LBP w/ prev XRays showing advanced degen arthritis but she declines referral to Ortho.  FIBROMYALGIA  (ICD-729.1) - takes Calcium, MVI, Vit D, & now TYLENOL Prn... hx mult somatic complaints.  VITAMIN D DEFICIENCY (ICD-268.9) ~  labs 6/10 showed Vit D level= 21... rec> Vit D 1000 u daily. ~  labs 3/11 showed Vit D level= 35... continue same.  POSTCONCUSSION SYNDROME (ICD-310.2) - from [place] 12/08, fully recovered & back to baseline. CERVICAL STRAIN, ACUTE (ICD-847.0) - "I do exercises and have a good pillow"... DIZZINESS (ICD-780.4) - uses MECLIZINE 50m Prn...  ANXIETY (ICD-300.00) - INTOL Ambien and Lorazepam w/ nightmares...  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   Past Surgical History  Procedure Date  . Cholecystectomy, laparoscopic 2004    for gallstone pancreatitis  . Thyroid surgery     Outpatient Encounter Prescriptions as of 10/23/2010  Medication Sig Dispense Refill  . acetaminophen (TYLENOL) 500 MG tablet Take 500 mg by mouth every 6 (six) hours as needed.        .Marland KitchenamLODipine (NORVASC) 5 MG tablet Take 5 mg by mouth daily.        . Cholecalciferol (VITAMIN D3) 1000 UNITS CAPS Take by mouth daily.        .Marland Kitchendextromethorphan (DELSYM) 30 MG/5ML liquid Take 60 mg by mouth as needed.        .Marland Kitchenesomeprazole (NEXIUM) 40 MG capsule Take 40 mg by mouth daily before breakfast.        . fluticasone (FLONASE) 50 MCG/ACT nasal spray 2 sprays by Nasal route daily.        . Fluticasone-Salmeterol (ADVAIR DISKUS) 250-50 MCG/DOSE AEPB Inhale 1 puff into the lungs 2 (two) times daily.  60 each  7  . loratadine (CLARITIN) 10 MG tablet Take 10 mg by mouth every evening.        . Multiple Vitamins-Minerals (CENTRUM SILVER PO) Take by mouth daily.        .Marland KitchenPropylene Glycol (SYSTANE BALANCE) 0.6 % SOLN Place 2 drops into both eyes 2 (two) times daily.        . sodium chloride (OCEAN) 0.65 % nasal spray 1 spray by Nasal route 2 (two) times daily as needed.        . Sodium Chloride-Sodium Bicarb (SINUS [place] BaseNETI  POT NA) by Nasal route as needed.        .Marland KitchenHYDROcodone-acetaminophen (VICODIN) 5-500 MG per tablet Take 1 tablet by mouth every 6 (six) hours as needed.        . traMADol (ULTRAM) 50 MG tablet Take 1-2 every 4 hours if still coughing on Delsym  40 tablet  0    Allergies  Allergen Reactions  . Amlodipine Besy-Benazepril Hcl     REACTION: fatique  . Amoxicillin     REACTION: pain in right kidney  .  Azithromycin     REACTION: itching  . Benicar Hct (Olmesartan Medoxomil-Hctz)     Extreme weakness  . Celexa   . Chlorzoxazone (Parafon Forte Dsc)   . Citalopram Hydrobromide     REACTION: hives  . Clonidine Derivatives   . Droperidol     REACTION: hives  . Dulera     Causes sore throat, blurred vision and unable to sleep--started on med 09-17-10  . Flexeril (Cyclobenzaprine Hcl)   . Ketorolac Tromethamine     REACTION: hives  . Morphine     REACTION: hives and itching  . Olmesartan Medoxomil     REACTION: fatique  . Reglan   . Symbicort     Causes tremors and numbness  . Toradol     Review of Systems        See HPI - all other systems neg except as noted... The patient complains of decreased hearing, dyspnea on exertion, headaches, muscle weakness, and difficulty walking.  The patient denies anorexia, fever, weight loss, weight gain, vision loss, hoarseness, chest pain, syncope, peripheral edema, prolonged cough, hemoptysis, abdominal pain, melena, hematochezia, severe indigestion/heartburn, hematuria, incontinence, suspicious skin lesions, transient blindness, depression, unusual weight change, abnormal bleeding, enlarged lymph nodes, and angioedema.    Objective:   Physical Exam     WD, WN, [name] in NAD... GENERAL:  Alert & oriented; pleasant & cooperative... HEENT:  [place]/AT, EOM-wnl, PERRLA, EACs-clear, TMs-wnl, NOSE: sl pale, no exud; THROAT-clear & wnl. NECK:  Supple w/ fair ROM; no JVD; normal carotid impulses w/o bruits; no thyromegaly or nodules palpated; no  lymphadenopathy. CHEST:  scat rhonchi & otherw clear, no rales or signs of consolidation... HEART:  Regular Rhythm; without murmurs/ rubs/ or gallops detected... ABDOMEN:  Soft & nontender; normal bowel sounds; no organomegaly or masses palpated... EXT: without deformities, mild arthritic changes; no varicose veins/ venous insuffic/ or edema. BACK:  she has numerous trigger point and tender spots thru the shoulders, back, chest etc... NEURO:  CN's intact; no focal neuro deficits... DERM:  No lesions noted; no rash etc...   Assessment & Plan:   AR & ASTHMA>  Stable on her Advair etc... She had mild exac treated 3/12 by TP w/ Omnicef, Mucinex, Hydromet...  HBP>  Controlled on Norvasc, low salt diet, etc...  CHOL>  FLP looks worse & she's trying to control on diet alone> refer to Lipid Clinic...  GERD>  She notes that the Nexium really works for her...  DJD/ Back Pain/ FM>  She uses Tylenol mostly for the pain, has Vicodin for prn use, does not want referral to Ortho for back...  Anxiety>  She was intol to Lorazepam in the past...  Mult medication sensitivities>  She has an impressive list of med reactions.Marland KitchenMarland Kitchen

## 2010-10-23 NOTE — Patient Instructions (Signed)
Today we updated your med list in EPIC...    Continue your current meds the same for now...  Today we did your follow up fasting blood work...    Please call the [facility]REE in a few days for your results...    Dial [id number] & when prompted enter your patient number followed by the # symbol...    Your patient number is:  [id number]#  Let's get on track w/ our diet, exercise, & wt reduction program...  Call for any questions...  Let's plan a follow up visit in 6 months, sooner if needed for problems.Marland KitchenMarland Kitchen

## 2010-10-25 ENCOUNTER — Other Ambulatory Visit: Payer: Self-pay | Admitting: Pulmonary Disease

## 2010-10-28 ENCOUNTER — Encounter: Payer: Self-pay | Admitting: Pulmonary Disease

## 2010-10-30 ENCOUNTER — Other Ambulatory Visit: Payer: Self-pay | Admitting: Pulmonary Disease

## 2010-10-30 DIAGNOSIS — E785 Hyperlipidemia, unspecified: Secondary | ICD-10-CM

## 2010-10-31 ENCOUNTER — Encounter: Payer: Medicare Other | Admitting: *Deleted

## 2010-11-01 ENCOUNTER — Telehealth: Payer: Self-pay | Admitting: Pulmonary Disease

## 2010-11-01 NOTE — Telephone Encounter (Signed)
We typically do not do that unless pt is having a lot of flare ups Would like to wait, if she has flare would like to see in office and then make decision on home nebs Cont w/ plan follow up in [place] this year, will discuss then or sooner if needed.

## 2010-11-01 NOTE — Telephone Encounter (Signed)
Called and spoke with pt.  Pt states she saw an advertisement on the TV from [facility] regarding a nebulizer.  Pt wanted to know if SN would ok order for pt to get a nebulizer and neb meds for pt to have on hand in the home as needed for her "breathing flare ups" so she doesn't always have to go to Urgent Care/ER for breathing treatments.  SN, please advise if this is ok or not.

## 2010-11-02 NOTE — Telephone Encounter (Signed)
Pt returning says it's ok to lmom.[name]

## 2010-11-02 NOTE — Telephone Encounter (Signed)
[email or web address]  

## 2010-11-02 NOTE — Telephone Encounter (Signed)
Called, spoke with pt.  She was informed of TPs' recs on home nebs and verbalized understanding of this.  She will f/u in Dec and will call back sooner if needed.

## 2010-11-06 ENCOUNTER — Telehealth: Payer: Self-pay | Admitting: Pulmonary Disease

## 2010-11-06 NOTE — Telephone Encounter (Signed)
I spoke with [name] and according to last phone note this was stated by [name], NP 11/01/2010 5:50 PM Signed  "We typically do not do that unless pt is having a lot of flare ups  Would like to wait, if she has flare would like to see in office and then make decision on home nebs  Cont w/ plan follow up in [place] this year, will discuss then or sooner if needed." I advised [name] of the above. The pt was also advised. [place], CMA

## 2010-11-08 ENCOUNTER — Ambulatory Visit (INDEPENDENT_AMBULATORY_CARE_PROVIDER_SITE_OTHER): Payer: Medicare Other

## 2010-11-08 VITALS — BP 132/84

## 2010-11-08 DIAGNOSIS — E785 Hyperlipidemia, unspecified: Secondary | ICD-10-CM

## 2010-11-08 NOTE — Patient Instructions (Signed)
1)  Start Fish Oil capsules (1,000 mg or 1,200 mg per capsule) - Take 1 capsule twice daily with food. 2)  Deitary Goals: - Limit fried chicken to twice per month - Limit portion sizes of pizza (eat only two slices and order a smaller size pizza) - Attempt to make healthier choices at fast food restaurants (ask for nutrition guide) - Limit desserts to once per day 3)  Exercise Goals:  Continue walking and leading chair exercises regularly, attempt to walk at least three times per week 4)  Return to clinic in 8 weeks on Thursday, August 23rd @ 10:00 am 5)  Labwork on Monday, August 20th @ 9:00 am (FASTING)  It was a pleasure to meet with you today!  Book option:  "[employer]" by [employer]

## 2010-11-08 NOTE — Assessment & Plan Note (Addendum)
Current lipid panel is as follows:  TC 231 (previously 221, goal<200), TG 187 (previously 385, goal<150), HDL 44.2 (previously 40.3, goal >50), LDL 148.9 (previously 115, goal <130).  Patient is not diabetic and has no history of cardiac disease.  Hypertension and age are her only risk factors.  TG, HDL, and LDL remain out of goal with much room for improvement.  [name] has significant room for improvement in her diet and does a good job with exercising regularly.  I have spoken with the patient regarding importance of making changes to her diet and continuing to exercise.  We have set a number of goals and will reassess in 8 weeks.  I have also suggested that patient start on Fish Oil - 1 g twice daily in an attempt to reduce TG to goal.    Plan: 1)  Start Fish Oil capsules (1,000 mg or 1,200 mg per capsule) - Take 1 capsule twice daily with food. 2)  Deitary Goals: - Limit fried chicken to twice per month - Limit portion sizes of pizza (eat only two slices and order a smaller size pizza) - Attempt to make healthier choices at fast food restaurants (ask for nutrition guide) - Limit desserts to once per day 3)  Exercise Goals:  Continue walking and leading chair exercises regularly, attempt to walk at least three times per week 4)  Return in 8 weeks for follow-up

## 2010-11-08 NOTE — Progress Notes (Signed)
[name] is a 70 year old female presenting to the lipid clinic for initial evaluation of hyperlipidemia.  She was referred by Dr. [name] after obtaining an LDL of 148 this past month.  [name] is not currently on any medications for lipids and has no history of taking medications for lipids.    Review of dietary habits reveals that the patient is not adhering to a heart-healthy diet.  Diet consists of the following: Breakfast - Cereal, banana, whole wheat toast with "Smart Butter" spread, sometimes with honey.  She is not doing so lately, but has been in the habit of eating 1-2 eggs and [place] sausage each morning.  She did this for quite some time until realizing that she should not consume this many eggs per week.  Lunch - She often eats out at fast food and other restaurants.  She frequently orders hamburgers or fried chicken.  She eats out for lunch at least 3 times per week and has fried chicken twice per week.  She does occasionally cook at home and when she does she tries to maintain a healthy diet.  Dinner - Every Friday night is pizza night.  She either orders from [facility] or gets a frozen pizza ([employer] is her favorite).   She orders a large onion and mushroom pizza and eats half of it in one sitting.  If she cooks a frozen pizza she eats the whole thing because it is smaller in size.   Desserts - after each meal and includes candy bars, cookies, cakes, etc.  Beverages - water, crystal light drinks and flavor packs for her water  Review of exercise reveals that the patient is walking for 30 minutes 3 times per week around her apt complex or neighborhood.  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  She does this twice per week, every other week.    Patient does not smoke, quit at early age of 22 years.  She does not smoke.

## 2010-11-15 ENCOUNTER — Ambulatory Visit
Admission: RE | Admit: 2010-11-15 | Discharge: 2010-11-15 | Disposition: A | Discharge status: Home / Self Care | Payer: Medicare Other | Source: Ambulatory Visit | Attending: Pulmonary Disease | Admitting: Pulmonary Disease

## 2010-11-15 DIAGNOSIS — Z1231 Encounter for screening mammogram for malignant neoplasm of breast: Secondary | ICD-10-CM

## 2010-12-18 ENCOUNTER — Other Ambulatory Visit: Payer: Self-pay | Admitting: Pulmonary Disease

## 2010-12-20 ENCOUNTER — Ambulatory Visit (INDEPENDENT_AMBULATORY_CARE_PROVIDER_SITE_OTHER): Payer: Medicare Other | Admitting: Adult Health

## 2010-12-20 ENCOUNTER — Encounter: Payer: Self-pay | Admitting: Adult Health

## 2010-12-20 DIAGNOSIS — J019 Acute sinusitis, unspecified: Secondary | ICD-10-CM | POA: Insufficient documentation

## 2010-12-20 MED ORDER — CEFDINIR 300 MG PO CAPS: 300.0000 mg | ORAL_CAPSULE | Freq: Two times a day (BID) | ORAL | Status: AC

## 2010-12-20 NOTE — Patient Instructions (Signed)
Omnicef 368m Twice daily  For 10 days -take with food -eat yogurt  Mucinex DM Twice daily  As needed  Congestion  Saline nasal rinses As needed   Fluids and rest  Please contact office for sooner follow up if symptoms do not improve or worsen or seek emergency care  follow up Dr. NLenna Gilford As planned and As needed

## 2010-12-20 NOTE — Progress Notes (Signed)
Subjective:    Patient ID: [name], female    DOB: 02-17-1941, 70 y.o.   MRN: [id number]  HPI  [name] with known hx of AR,  HTN and Hyperlipidemia  ~  July 19, 2009:  c/o incr asthma symptoms- chest heavy, SOB, noct cough, sneezing... she's only been doing the Advair Qam & we discussed incr to Bid + Depo80 & Dosepak... also c/o decr memory but she wants to wait on meds...  ~  October 24, 2009:  58moROV & improved using the Advair two times a day... no recent flairs, breathing good, denies cough/ phlegm/ etc... BP controlled on Norvasc;  still trying to control Chol on diet alone- weight= 188#, down 2#;  we discussed diet + exercise but she notes more trouble walking & we will refer to DrOlin to check her knee &  that she can't do water exercise due to "allergy attacks" in the pool...   ~  April 24, 2010:  6871moOV- c/o right side pain,worse in the eve, deep in flank area, no rash, she thought kid stone- eval DrOttelin 11/11 neg, Mobic helped some, exam w/ tenderness over floating ribs... otherw astma OK;  BP remains stable;  Chol rx w/ diet alone & wt down 8# to 180#;  etc...  ~  October 23, 2010:  71m87moV & she notes persist mild back discomfort> she had XRays 2/12 w/ advanced DJD in TSpine & scoliosis & spondylosis in LSspine; treated w/ Mobic/ Tramadol but she has hx mult medication reactions & prefers Tylenol & an occas Vicodin prn; she says moving about after arising really helps & symptoms aren't bad enough for ortho referral yet, but she will let us [place]...    BP remains stable on Norvasc; AB under control back on Advair; Nexium really helps her GERD symptoms; she is due for fasting blood work today> FLP looks worse on her diet & I rec refer to Lipid clinic...  ~12/20/2010 Acute OV Complains of sinus pressure/congestion, bilateral ear discomfort, dizziness x2weeks . Ears feel full. Sinus pain and pressure . Using Saline rinses without much help. Feels lightheaded and ringing in ears  intermittently. NO hemoptysis or chest pain .  Minimal cough . Mainly nasal congestion . OTC not helping.  No visual/speech changes. No ext weakness.    Problem List:    ALLERGIC RHINITIS (ICD-477.9) - she uses OTC Claritin, Saline and FLONASE... she really likes the "netti pot"...  ASTHMA (ICD-493.90) - stable on ADVAIR 250Bid... Intol Dulera w/ sore throat & insomnia. ~  CXR 3/11 showed hypoaeration, basilar atx, NAD. ~  CXR 10/11 showed clear, DJD spine, NAD.  HYPERTENSION (ICD-401.9) - controlled on NORVASC 5mg54m.. BP today = 140/80 & BP's even better at home... She is also rec to take ASA 81mg73m. denies HA, visual changes, CP, palipit, syncope, dyspnea, edema, etc...  HYPERCHOLESTEROLEMIA, BORDERLINE (ICD-272.4) - on diet + exercise, doesn't want meds. ~  [place] showed TChol 220, TG 279, HDL 43, LDL 122 ~  FLP 6/10 showed TChol 204, TG 167, HDL 43, LDL 123... needs better diet, get wt down. ~  FLP 6/11 showed TChol 221, TG 385, HDL 40, LDL 116... must get wt down! discussed low chol, low fat. ~  FLP 6/12 showed TChol 231, TG 187, HDL 44, LDL 149... Looks worse despite diet efforts, refer to Lipid Clinic.  GERD (ICD-530.81) - prev on Protonix but insurance won't pay, therefore switch to [place]/39m ?Omep caused diarrhea. ~  last EGD 9/06 showed esophagitis...  Hx Gallstones -  s/p ERCP w/ sphincterotomy 12/04... and subsequent cholecystectomy...  DIVERTICULOSIS OF COLON (ICD-562.10) IBS (ICD-564.1) COLONIC POLYPS (ICD-211.3) ~  colonoscopy 12/00 showed divertics, hems, colon polyp- adenomatous...  ~  f/u colonoscopy 3/10 by DrKaplan w/ polyp removed- adenomatous, f/u planned 5 yrs.  Hx of NEPHROLITHIASIS (ICD-592.0) - went to ER 4/10 w/ left flank pain & CT showed mild hydroneph & kidney stone... passed it on her own & followed by Urology- DrOttelin... seen 11/11 w/ right flank pain & neg eval (exam w/ tender ribs on palp),  OSTEOARTHRITIS (ICD-715.90) - she has some  left hip & R>L knee discomfort...  uses Tylenol for pain... she notes prev right knee arthroscopy in 1999 by DrRendall... Rx w/ VICODIN Prn. ~  6/11:  we discussed ortho referral & will set up refer to DrOlin per request. ~  6/12:  She notes LBP w/ prev XRays showing advanced degen arthritis but she declines referral to Ortho.  FIBROMYALGIA (ICD-729.1) - takes Calcium, MVI, Vit D, & now TYLENOL Prn... hx mult somatic complaints.  VITAMIN D DEFICIENCY (ICD-268.9) ~  labs 6/10 showed Vit D level= 21... rec> Vit D 1000 u daily. ~  labs 3/11 showed Vit D level= 35... continue same.  POSTCONCUSSION SYNDROME (ICD-310.2) - from [facility] 12/08, fully recovered & back to baseline. CERVICAL STRAIN, ACUTE (ICD-847.0) - "I do exercises and have a good pillow"... DIZZINESS (ICD-780.4) - uses MECLIZINE 15m Prn...  ANXIETY (ICD-300.00) - INTOL Ambien and Lorazepam w/ nightmares...  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   Past Surgical History  Procedure Date  . Cholecystectomy, laparoscopic 2004    for gallstone pancreatitis  . Thyroid surgery     Outpatient Encounter Prescriptions as of 12/20/2010  Medication Sig Dispense Refill  . acetaminophen (TYLENOL) 500 MG tablet Take 500 mg by mouth every 6 (six) hours as needed.        .Marland KitchenamLODipine (NORVASC) 5 MG tablet TAKE 1 TABLET BY MOUTH EVERY DAY  30 tablet  3  . aspirin 81 MG EC tablet Take 81 mg by mouth daily.        . Cholecalciferol (VITAMIN D3) 1000 UNITS CAPS Take by mouth daily.        .Marland Kitchenesomeprazole (NEXIUM) 40 MG capsule Take 40 mg by mouth daily before breakfast.        . fluticasone (FLONASE) 50 MCG/ACT nasal spray USE 2 SPRAYS IN EACH NOSTRIL TWICE A DAY  1 Act  11  . Fluticasone-Salmeterol (ADVAIR DISKUS) 250-50 MCG/DOSE AEPB Inhale 1 puff into the lungs 2 (two) times daily.  60 each  7  . HYDROcodone-acetaminophen (VICODIN) 5-500 MG per tablet Take 1  tablet by mouth every 6 (six) hours as needed.        . loratadine (CLARITIN) 10 MG tablet Take 10 mg by mouth every evening.        . meclizine (ANTIVERT) 50 MG tablet Take 25 mg by mouth 3 (three) times daily as needed.        . Multiple Vitamins-Minerals (CENTRUM SILVER PO) Take by mouth daily.        . Omega-3 Fatty Acids (FISH OIL) 1000 MG CAPS Take 1 capsule by mouth 2 (two) times daily.        .Marland KitchenPropylene Glycol (SYSTANE BALANCE) 0.6 % SOLN Place 2 drops into both eyes 2 (two) times daily.        .Marland Kitchen  sodium chloride (OCEAN) 0.65 % nasal spray 1 spray by Nasal route 2 (two) times daily as needed.        . Sodium Chloride-Sodium Bicarb (SINUS [place] NETI POT NA) by Nasal route as needed.        . traMADol (ULTRAM) 50 MG tablet Take 1-2 every 4 hours if still coughing on Delsym  40 tablet  0  . cefdinir (OMNICEF) 300 MG capsule Take 1 capsule (300 mg total) by mouth 2 (two) times daily.  20 capsule  0  . dextromethorphan (DELSYM) 30 MG/5ML liquid Take 60 mg by mouth as needed.          Allergies  Allergen Reactions  . Amlodipine Besy-Benazepril Hcl     REACTION: fatique  . Amoxicillin     REACTION: pain in right kidney  . Azithromycin     REACTION: itching  . Benicar Hct (Olmesartan Medoxomil-Hctz)     Extreme weakness  . Celexa   . Chlorzoxazone (Parafon Forte Dsc)   . Citalopram Hydrobromide     REACTION: hives  . Clonidine Derivatives   . Droperidol     REACTION: hives  . Dulera     Causes sore throat, blurred vision and unable to sleep--started on med 09-17-10  . Flexeril (Cyclobenzaprine Hcl)   . Ketorolac Tromethamine     REACTION: hives  . Morphine     REACTION: hives and itching  . Olmesartan Medoxomil     REACTION: fatique  . Reglan   . Symbicort     Causes tremors and numbness  . Toradol     Review of Systems         See HPI - all other systems neg except as noted... The patient complains of decreased hearing, dyspnea on exertion, headaches, muscle weakness,  and difficulty walking.  The patient denies anorexia, fever, weight loss, weight gain, vision loss, hoarseness, chest pain, syncope, peripheral edema, prolonged cough, hemoptysis, abdominal pain, melena, hematochezia, severe indigestion/heartburn, hematuria, incontinence, suspicious skin lesions, transient blindness, depression, unusual weight change, abnormal bleeding, enlarged lymph nodes, and angioedema.    Objective:   Physical Exam      WD, WN, [name] in NAD... GENERAL:  Alert & oriented; pleasant & cooperative... HEENT:  [place]/AT, EOM-wnl, PERRLA, EACs-clear, TMs-wnl, NOSE: red, swollen, max tenderness THROAT-clear & wnl. NECK:  Supple w/ fair ROM; no JVD; normal carotid impulses w/o bruits; no thyromegaly or nodules palpated; no lymphadenopathy. CHEST:  scat rhonchi & otherw clear, no rales or signs of consolidation... HEART:  Regular Rhythm; without murmurs/ rubs/ or gallops detected... ABDOMEN:  Soft & nontender; normal bowel sounds; no organomegaly or masses palpated... EXT: without deformities, mild arthritic changes; no varicose veins/ venous insuffic/ or edema. BACK:  she has numerous trigger point and tender spots thru the shoulders, back, chest etc... NEURO:  CN's intact; no focal neuro deficits... DERM:  No lesions noted; no rash etc...   Assessment & Plan:

## 2010-12-20 NOTE — Assessment & Plan Note (Signed)
Omnicef 348m Twice daily  For 10 days -take with food -eat yogurt  Mucinex DM Twice daily  As needed  Congestion  Saline nasal rinses As needed   Fluids and rest  Please contact office for sooner follow up if symptoms do not improve or worsen or seek emergency care  follow up Dr. NLenna Gilford As planned and As needed

## 2010-12-31 ENCOUNTER — Ambulatory Visit (INDEPENDENT_AMBULATORY_CARE_PROVIDER_SITE_OTHER): Payer: Medicare Other | Admitting: *Deleted

## 2010-12-31 DIAGNOSIS — E785 Hyperlipidemia, unspecified: Secondary | ICD-10-CM

## 2010-12-31 DIAGNOSIS — I10 Essential (primary) hypertension: Secondary | ICD-10-CM

## 2010-12-31 LAB — LIPID PANEL
Cholesterol: 192 mg/dL (ref 0–200)
Triglycerides: 329 mg/dL — ABNORMAL HIGH (ref 0.0–149.0)

## 2010-12-31 LAB — HEPATIC FUNCTION PANEL
ALT: 27 U/L (ref 0–35)
Total Protein: 7.2 g/dL (ref 6.0–8.3)

## 2011-01-03 ENCOUNTER — Ambulatory Visit (INDEPENDENT_AMBULATORY_CARE_PROVIDER_SITE_OTHER): Payer: Medicare Other

## 2011-01-03 VITALS — BP 125/80 | Wt 192.5 lb

## 2011-01-03 DIAGNOSIS — E785 Hyperlipidemia, unspecified: Secondary | ICD-10-CM

## 2011-01-03 NOTE — Patient Instructions (Signed)
1) Start taking FIsh Oil: 1 capsule 3 times a day. 2) Try to increase exercise--start at 15 minutes 3 days a week and increase as you find able 3) Try to buy smaller portion-sized bags to avoid temptation. 4) If you order a large pizza, take a serving and put the rest away.  5) Try to decrease your fried food to fewer days per week (aim for 3 days or less). 6) Follow-up in 3 months--Office visit 03/28/11 at 9:30am; FASTING lab work 03/21/11 at 9:10am

## 2011-01-04 NOTE — Assessment & Plan Note (Addendum)
Assessment: Ms Chambers is a 33yoF with borderline hyperlipidemia. TChol 192 (Goal <200), LDL 100 (Goal <130), HDL 44.7 (Goal >45), Trig 329 (Goal <150). Overall her lipids are improved since starting Fish Oil; however her triglycerides have increased from 187 to 329. We discussed ways to try to avoid overeating, such as buying smaller bags of snacks, trying to avoid buying the unhealthy snacks to avoid temptation, trying to put a serving in the bowl/plate and putting the rest away or sharing her pizza with friends in her building. We talked about trying to eat slower to give her body time to process the food and give her feeling of fullness. She says she has been too "lazy" to exercise, so we discussed starting slow--starting to walk around her building for 15 minutes 3x/week and working her way up to 5 days as she improves. She was very down on herself, so I tried to motivate her in that if she has a bad day, to start fresh the next day and make better food choices.  Plan:  1) Increase Fish Oil to 1 capsule 3 times daily 2) Start walking around the building for 15 minutes 3 days/week and increase as tolerates 3) Try to eat/buy smaller portions of snacks to avoid overeating 4) Decrease fried food (aim for less than 3 days per week) and watching portions of pizza 5) Office follow-up in 3 months (03/28/11); Fasting labs 03/21/11

## 2011-01-04 NOTE — Progress Notes (Signed)
Carla. [name] is a 64yoF that presented for her 8-week follow-up appointment at the [facility]. Her current medication regimen is Fish Oil 1,068m capsule twice daily with meals. At her last appointment, dietary restrictions were discussed; however, Carla WOrdwayadmits that she failed to work on her diet and exercise. She felt guilty about not doing better, but she states that she struggles with food, often not being able to stop herself and using food for comfort.   Review of Diet: Breakfast--Turkey sausage, eggs, sugar-free jelly on whole wheat toast. Lunch--Fried chicken, pasta with sauce, whole wheat bread. Dinner--cereal with fruit, toast with smart-butter spread. Snacks: chips, granola bars, Cheetos, canned fruit in lite syrup. She has been trying to eat more vegetables-green beans, okra, carrots, peas and has been replacing some snacks with yogurt. She admits that she generally has fried foods most days of the week (5-7 days/wk)  Review of Exercise: chair exercises/stretches in her building; denies any additional exercise  BP: 125/80  Wt: 192.5 lbs  Current Outpatient Prescriptions on File Prior to Visit  Medication Sig Dispense Refill  . acetaminophen (TYLENOL) 500 MG tablet Take 500 mg by mouth every 6 (six) hours as needed.        .Marland KitchenamLODipine (NORVASC) 5 MG tablet TAKE 1 TABLET BY MOUTH EVERY DAY  30 tablet  3  . aspirin 81 MG EC tablet Take 81 mg by mouth daily.        . Cholecalciferol (VITAMIN D3) 1000 UNITS CAPS Take by mouth daily.        .Marland Kitchendextromethorphan (DELSYM) 30 MG/5ML liquid Take 60 mg by mouth as needed.        .Marland Kitchenesomeprazole (NEXIUM) 40 MG capsule Take 40 mg by mouth daily before breakfast.        . fluticasone (FLONASE) 50 MCG/ACT nasal spray USE 2 SPRAYS IN EACH NOSTRIL TWICE A DAY  1 Act  11  . Fluticasone-Salmeterol (ADVAIR DISKUS) 250-50 MCG/DOSE AEPB Inhale 1 puff into the lungs 2 (two) times daily.  60 each  7  . HYDROcodone-acetaminophen (VICODIN) 5-500 MG per  tablet Take 1 tablet by mouth every 6 (six) hours as needed.        . loratadine (CLARITIN) 10 MG tablet Take 10 mg by mouth every evening.        . meclizine (ANTIVERT) 50 MG tablet Take 25 mg by mouth 3 (three) times daily as needed.        . Multiple Vitamins-Minerals (CENTRUM SILVER PO) Take by mouth daily.        . Omega-3 Fatty Acids (FISH OIL) 1000 MG CAPS Take 1 capsule by mouth 2 (two) times daily.        .Marland KitchenPropylene Glycol (SYSTANE BALANCE) 0.6 % SOLN Place 2 drops into both eyes 2 (two) times daily.        . sodium chloride (OCEAN) 0.65 % nasal spray 1 spray by Nasal route 2 (two) times daily as needed.        . Sodium Chloride-Sodium Bicarb (SINUS [place] POT NA) by Nasal route as needed.        . traMADol (ULTRAM) 50 MG tablet Take 1-2 every 4 hours if still coughing on Delsym  40 tablet  0    Allergies  Allergen Reactions  . Amlodipine Besy-Benazepril Hcl     REACTION: fatique  . Amoxicillin     REACTION: pain in right kidney  . Azithromycin     REACTION: itching  .  Benicar Hct (Olmesartan Medoxomil-Hctz)     Extreme weakness  . Celexa   . Chlorzoxazone (Parafon Forte Dsc)   . Citalopram Hydrobromide     REACTION: hives  . Clonidine Derivatives   . Droperidol     REACTION: hives  . Dulera     Causes sore throat, blurred vision and unable to sleep--started on med 09-17-10  . Flexeril (Cyclobenzaprine Hcl)   . Ketorolac Tromethamine     REACTION: hives  . Morphine     REACTION: hives and itching  . Olmesartan Medoxomil     REACTION: fatique  . Reglan   . Symbicort     Causes tremors and numbness  . Toradol

## 2011-01-07 ENCOUNTER — Ambulatory Visit (INDEPENDENT_AMBULATORY_CARE_PROVIDER_SITE_OTHER): Payer: Medicare Other | Admitting: Adult Health

## 2011-01-07 ENCOUNTER — Encounter: Payer: Self-pay | Admitting: Adult Health

## 2011-01-07 VITALS — BP 122/74 | HR 80 | Temp 98.7°F | Wt 192.4 lb

## 2011-01-07 DIAGNOSIS — J019 Acute sinusitis, unspecified: Secondary | ICD-10-CM

## 2011-01-07 MED ORDER — PREDNISONE 10 MG PO TABS: ORAL_TABLET | ORAL | Status: AC

## 2011-01-07 MED ORDER — METHYLPREDNISOLONE ACETATE 80 MG/ML IJ SUSP
120.0000 mg | Freq: Once | INTRAMUSCULAR | Status: AC
  Administered 2011-01-07: 120 mg via INTRAMUSCULAR

## 2011-01-07 MED ORDER — LEVOFLOXACIN 500 MG PO TABS: 500.0000 mg | ORAL_TABLET | Freq: Every day | ORAL | Status: AC

## 2011-01-07 NOTE — Progress Notes (Signed)
Subjective:    Patient ID: [name], female    DOB: May 27, 1940, 70 y.o.   MRN: [id number]  HPI  [name] with known hx of AR,  HTN and Hyperlipidemia  ~  July 19, 2009:  c/o incr asthma symptoms- chest heavy, SOB, noct cough, sneezing... she's only been doing the Advair Qam & we discussed incr to Bid + Depo80 & Dosepak... also c/o decr memory but she wants to wait on meds...  ~  October 24, 2009:  54moROV & improved using the Advair two times a day... no recent flairs, breathing good, denies cough/ phlegm/ etc... BP controlled on Norvasc;  still trying to control Chol on diet alone- weight= 188#, down 2#;  we discussed diet + exercise but she notes more trouble walking & we will refer to DrOlin to check her knee &  that she can't do water exercise due to "allergy attacks" in the pool...   ~  April 24, 2010:  628moOV- c/o right side pain,worse in the eve, deep in flank area, no rash, she thought kid stone- eval DrOttelin 11/11 neg, Mobic helped some, exam w/ tenderness over floating ribs... otherw astma OK;  BP remains stable;  Chol rx w/ diet alone & wt down 8# to 180#;  etc...  ~  October 23, 2010:  39m55moV & she notes persist mild back discomfort> she had XRays 2/12 w/ advanced DJD in TSpine & scoliosis & spondylosis in LSspine; treated w/ Mobic/ Tramadol but she has hx mult medication reactions & prefers Tylenol & an occas Vicodin prn; she says moving about after arising really helps & symptoms aren't bad enough for ortho referral yet, but she will let us [place]...    BP remains stable on Norvasc; AB under control back on Advair; Nexium really helps her GERD symptoms; she is due for fasting blood work today> FLP looks worse on her diet & I rec refer to Lipid clinic...  ~12/20/2010 Acute OV Complains of sinus pressure/congestion, bilateral ear discomfort, dizziness x2weeks . Ears feel full. Sinus pain and pressure . Using Saline rinses without much help. Feels lightheaded and ringing in ears  intermittently. NO hemoptysis or chest pain .  Minimal cough . Mainly nasal congestion . OTC not helping.  No visual/speech changes. No ext weakness. >Omnicef x 10d   01/07/2011 Acute OV  Pt presents for an acute office visit. Complains of persistent symptoms of sinus congestion.Complains of dizziness, headache at night, right ear ache x 3 days, hoarseness, cough w/ clear thick phlem, nasal congestion. Has sinus pain and pressure. Blowing out clear mucus but now coughing up a lot of thick congestion. Pt states this has been going x 1 month.    Problem List:    ALLERGIC RHINITIS (ICD-477.9) - she uses OTC Claritin, Saline and FLONASE... she really likes the "netti pot"...  ASTHMA (ICD-493.90) - stable on ADVAIR 250Bid... Intol Dulera w/ sore throat & insomnia. ~  CXR 3/11 showed hypoaeration, basilar atx, NAD. ~  CXR 10/11 showed clear, DJD spine, NAD.  HYPERTENSION (ICD-401.9) - controlled on NORVASC 5mg22m.. BP today = 140/80 & BP's even better at home... She is also rec to take ASA 81mg68m. denies HA, visual changes, CP, palipit, syncope, dyspnea, edema, etc...  HYPERCHOLESTEROLEMIA, BORDERLINE (ICD-272.4) - on diet + exercise, doesn't want meds. ~  FLP 1[place] showed TChol 220, TG 279, HDL 43, LDL 122 ~  FLP 6/10 showed TChol 204, TG 167, HDL 43, LDL 123... needs better  diet, get wt down. ~  FLP 6/11 showed TChol 221, TG 385, HDL 40, LDL 116... must get wt down! discussed low chol, low fat. ~  FLP 6/12 showed TChol 231, TG 187, HDL 44, LDL 149... Looks worse despite diet efforts, refer to Lipid Clinic.  GERD (ICD-530.81) - prev on Protonix but insurance won't pay, therefore switch to [place] 5m/d... ?Omep caused diarrhea. ~  last EGD 9/06 showed esophagitis...  Hx Gallstones -  s/p ERCP w/ sphincterotomy 12/04... and subsequent cholecystectomy...  DIVERTICULOSIS OF COLON (ICD-562.10) IBS (ICD-564.1) COLONIC POLYPS (ICD-211.3) ~  colonoscopy 12/00 showed divertics, hems, colon polyp-  adenomatous...  ~  f/u colonoscopy 3/10 by DrKaplan w/ polyp removed- adenomatous, f/u planned 5 yrs.  Hx of NEPHROLITHIASIS (ICD-592.0) - went to ER 4/10 w/ left flank pain & CT showed mild hydroneph & kidney stone... passed it on her own & followed by Urology- DrOttelin... seen 11/11 w/ right flank pain & neg eval (exam w/ tender ribs on palp),  OSTEOARTHRITIS (ICD-715.90) - she has some left hip & R>L knee discomfort...  uses Tylenol for pain... she notes prev right knee arthroscopy in 1999 by DrRendall... Rx w/ VICODIN Prn. ~  6/11:  we discussed ortho referral & will set up refer to DrOlin per request. ~  6/12:  She notes LBP w/ prev XRays showing advanced degen arthritis but she declines referral to Ortho.  FIBROMYALGIA (ICD-729.1) - takes Calcium, MVI, Vit D, & now TYLENOL Prn... hx mult somatic complaints.  VITAMIN D DEFICIENCY (ICD-268.9) ~  labs 6/10 showed Vit D level= 21... rec> Vit D 1000 u daily. ~  labs 3/11 showed Vit D level= 35... continue same.  POSTCONCUSSION SYNDROME (ICD-310.2) - [place], fully [place] & back to baseline. CERVICAL STRAIN, ACUTE (ICD-847.0) - "I do exercises and have a good pillow"... DIZZINESS (ICD-780.4) - uses MECLIZINE 232mPrn...  ANXIETY (ICD-300.00) - INTOL Ambien and Lorazepam w/ nightmares...  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   Past Surgical History  Procedure Date  . Cholecystectomy, laparoscopic 2004    for gallstone pancreatitis  . Thyroid surgery     Outpatient Encounter Prescriptions as of 01/07/2011  Medication Sig Dispense Refill  . acetaminophen (TYLENOL) 500 MG tablet Take 500 mg by mouth every 6 (six) hours as needed.        . Marland KitchenmLODipine (NORVASC) 5 MG tablet TAKE 1 TABLET BY MOUTH EVERY DAY  30 tablet  3  . aspirin 81 MG EC tablet Take 81 mg by mouth daily.        . Cholecalciferol (VITAMIN D3) 1000 UNITS CAPS Take by mouth  daily.        . Marland Kitchensomeprazole (NEXIUM) 40 MG capsule Take 40 mg by mouth daily before breakfast.        . fluticasone (FLONASE) 50 MCG/ACT nasal spray USE 2 SPRAYS IN EACH NOSTRIL TWICE A DAY  1 Act  11  . Fluticasone-Salmeterol (ADVAIR DISKUS) 250-50 MCG/DOSE AEPB Inhale 1 puff into the lungs 2 (two) times daily.  60 each  7  . HYDROcodone-acetaminophen (VICODIN) 5-500 MG per tablet Take 1 tablet by mouth every 6 (six) hours as needed.        . loratadine (CLARITIN) 10 MG tablet Take 10 mg by mouth every evening.        . meclizine (ANTIVERT) 50 MG tablet Take 25 mg by mouth 3 (three) times daily as needed.        .Marland Kitchen  Multiple Vitamins-Minerals (CENTRUM SILVER PO) Take by mouth daily.        . Omega-3 Fatty Acids (FISH OIL) 1000 MG CAPS Take 1 capsule by mouth 3 (three) times daily.       Marland Kitchen Propylene Glycol (SYSTANE BALANCE) 0.6 % SOLN Place 2 drops into both eyes 2 (two) times daily.        . sodium chloride (OCEAN) 0.65 % nasal spray 1 spray by Nasal route 2 (two) times daily as needed.        . Sodium Chloride-Sodium Bicarb (SINUS [place] NETI POT NA) by Nasal route as needed.        . traMADol (ULTRAM) 50 MG tablet Take 1-2 every 4 hours if still coughing on Delsym. As needed       . DISCONTD: dextromethorphan (DELSYM) 30 MG/5ML liquid Take 60 mg by mouth as needed.        Marland Kitchen DISCONTD: traMADol (ULTRAM) 50 MG tablet Take 1-2 every 4 hours if still coughing on Delsym  40 tablet  0    Allergies  Allergen Reactions  . Amlodipine Besy-Benazepril Hcl     REACTION: fatique  . Amoxicillin     REACTION: pain in right kidney  . Azithromycin     REACTION: itching  . Benicar Hct (Olmesartan Medoxomil-Hctz)     Extreme weakness  . Celexa   . Chlorzoxazone (Parafon Forte Dsc)   . Citalopram Hydrobromide     REACTION: hives  . Clonidine Derivatives   . Droperidol     REACTION: hives  . Dulera     Causes sore throat, blurred vision and unable to sleep--started on med 09-17-10  . Flexeril  (Cyclobenzaprine Hcl)   . Ketorolac Tromethamine     REACTION: hives  . Morphine     REACTION: hives and itching  . Olmesartan Medoxomil     REACTION: fatique  . Reglan   . Symbicort     Causes tremors and numbness  . Toradol     Review of Systems Constitutional:   No  weight loss, night sweats,   +Fevers, chills, fatigue, or  lassitude.  HEENT:   No   Difficulty swallowing,  Tooth/dental problems, or  Sore throat,                + sneezing, itching, ear ache, nasal congestion, post nasal drip,   CV:  No chest pain,  Orthopnea, PND, swelling in lower extremities, anasarca, dizziness, palpitations, syncope.   GI  No heartburn, indigestion, abdominal pain, nausea, vomiting, diarrhea, change in bowel habits, loss of appetite, bloody stools.   Resp: No shortness of breath with exertion or at rest. + excess mucus, no productive cough,  No non-productive cough,  No coughing up of blood.  No change in color of mucus.  No wheezing.  No chest wall deformity  Skin: no rash or lesions.  GU: no dysuria, change in color of urine, no urgency or frequency.  No flank pain, no hematuria   MS:  No joint pain or swelling.  No decreased range of motion   Psych:  No change in mood or affect. No depression or anxiety.  No memory loss.               Objective:   Physical Exam      WD, WN, [name] in NAD... GENERAL:  Alert & oriented; pleasant & cooperative... HEENT:  [place]/AT, EOM-wnl, PERRLA, EACs-clear, TMs full NOSE: red, swollen, max tenderness THROAT-clear & wnl.  NECK:  Supple w/ fair ROM; no JVD; normal carotid impulses w/o bruits; no thyromegaly or nodules palpated; no lymphadenopathy. CHEST:  scat rhonchi & otherw clear, no rales or signs of consolidation... HEART:  Regular Rhythm; without murmurs/ rubs/ or gallops detected... ABDOMEN:  Soft & nontender; normal bowel sounds; no organomegaly or masses palpated... EXT: without deformities, mild arthritic changes; no varicose veins/  venous insuffic/ or edema. BACK:  she has numerous trigger point and tender spots thru the shoulders, back, chest etc... NEURO:  CN's intact; no focal neuro deficits... DERM:  No lesions noted; no rash etc...   Assessment & Plan:

## 2011-01-07 NOTE — Assessment & Plan Note (Signed)
Slow to resolve flare with mild asthma Plan:  Depo medrol 159m IM x 1 in office  Levaquin 5052mdaily for 10 days  Prednisone taper over next week Mucinex DM Twice daily  As needed  Congestion  Saline nasal rinses As needed   Fluids and rest  Please contact office for sooner follow up if symptoms do not improve or worsen or seek emergency care  follow up Dr. NaLenna GilfordAs planned and As needed   Hold fish oil x 1 month  May take Meclizine As needed  For dizziness.  Hold claritin for 1 week then restart.

## 2011-01-07 NOTE — Patient Instructions (Addendum)
Levaquin 54m daily for 10 days  Prednisone taper over next week Mucinex DM Twice daily  As needed  Congestion  Saline nasal rinses As needed   Fluids and rest  Please contact office for sooner follow up if symptoms do not improve or worsen or seek emergency care  follow up Dr. NLenna Gilford As planned and As needed   Hold fish oil x 1 month  May take Meclizine As needed  For dizziness.  Hold claritin for 1 week then restart.

## 2011-01-21 ENCOUNTER — Telehealth: Payer: Self-pay | Admitting: Pulmonary Disease

## 2011-01-21 NOTE — Telephone Encounter (Signed)
Spoke with pt. She states that she is still being bothered with allergy symptoms- rhinitis, watery eyes and sneezing and wants to know if SN thinks that she needs to be tested for allergies. She states that she has neg skin test done many yrs ago, but feels that this may be necessary to try again. Please advise, thanks!

## 2011-01-21 NOTE — Telephone Encounter (Signed)
Called, spoke with pt.  She is aware TP ok with her seeing an allergist and recs Dr. Annamaria Boots.  Pt would like to schedule allergy consult with CDY at this time.  First available is not until Oct 22 but pt ok with this date.  Allergy Consult scheduled with CDY for Oct 22 at 2:30 pm -- pt aware to arrive at 2:15 pm and call sooner if needed.  She verbalized understanding of this.

## 2011-01-21 NOTE — Telephone Encounter (Signed)
That is fine can refer to Allergist- Dr. Annamaria Boots would be fine.

## 2011-02-11 ENCOUNTER — Ambulatory Visit (INDEPENDENT_AMBULATORY_CARE_PROVIDER_SITE_OTHER): Payer: Medicare Other | Admitting: Adult Health

## 2011-02-11 ENCOUNTER — Encounter: Payer: Self-pay | Admitting: Adult Health

## 2011-02-11 VITALS — BP 132/80 | HR 83 | Temp 97.6°F | Ht 61.0 in | Wt 193.2 lb

## 2011-02-11 DIAGNOSIS — J309 Allergic rhinitis, unspecified: Secondary | ICD-10-CM

## 2011-02-11 NOTE — Patient Instructions (Addendum)
Stop Claritin .  Begin Zyrtec 43m At bedtime   Flonase Nasal 2 puffs in am.  Continue with saline nasal rinses Twice daily   USe saline nasal gel At bedtime   Mucinex DM Twice daily  As needed congestion  Begin Patanase Nasal 2 puffs At bedtime  -until sample is gone.  Follow up Dr. YAnnamaria Boots As planned for Allergy evaluation  We are setting you up for CT scan of your sinuses. I will call with results.  Please contact office for sooner follow up if symptoms do not improve or worsen or seek emergency care  follow up Dr. NLenna Gilford In 2 months as planned

## 2011-02-11 NOTE — Progress Notes (Signed)
Subjective:    Patient ID: [name], female    DOB: [birth year], 70 y.o.   MRN: [id number]  HPI  [name] with known hx of AR,  HTN and Hyperlipidemia  ~  July 19, 2009:  c/o incr asthma symptoms- chest heavy, SOB, noct cough, sneezing... she's only been doing the Advair Qam & we discussed incr to Bid + Depo80 & Dosepak... also c/o decr memory but she wants to wait on meds...  ~  October 24, 2009:  33moROV & improved using the Advair two times a day... no recent flairs, breathing good, denies cough/ phlegm/ etc... BP controlled on Norvasc;  still trying to control Chol on diet alone- weight= 188#, down 2#;  we discussed diet + exercise but she notes more trouble walking & we will refer to DrOlin to check her knee &  that she can't do water exercise due to "allergy attacks" in the pool...   ~  April 24, 2010:  629moOV- c/o right side pain,worse in the eve, deep in flank area, no rash, she thought kid stone- eval DrOttelin 11/11 neg, Mobic helped some, exam w/ tenderness over floating ribs... otherw astma OK;  BP remains stable;  Chol rx w/ diet alone & wt down 8# to 180#;  etc...  ~  October 23, 2010:  39m52moV & she notes persist mild back discomfort> she had XRays 2/12 w/ advanced DJD in TSpine & scoliosis & spondylosis in LSspine; treated w/ Mobic/ Tramadol but she has hx mult medication reactions & prefers Tylenol & an occas Vicodin prn; she says moving about after arising really helps & symptoms aren't bad enough for ortho referral yet, but she will let us [place]...    BP remains stable on Norvasc; AB under control back on Advair; Nexium really helps her GERD symptoms; she is due for fasting blood work today> FLP looks worse on her diet & I rec refer to Lipid clinic...  ~12/20/2010 Acute OV Complains of sinus pressure/congestion, bilateral ear discomfort, dizziness x2weeks . Ears feel full. Sinus pain and pressure . Using Saline rinses without much help. Feels lightheaded and ringing in ears  intermittently. NO hemoptysis or chest pain .  Minimal cough . Mainly nasal congestion . OTC not helping.  No visual/speech changes. No ext weakness. >Omnicef x 10d   01/07/2011 Acute OV  Pt presents for an acute office visit. Complains of persistent symptoms of sinus congestion.Complains of dizziness, headache at night, right ear ache x 3 days, hoarseness, cough w/ clear thick phlem, nasal congestion. Has sinus pain and pressure. Blowing out clear mucus but now coughing up a lot of thick congestion. Pt states this has been going x 1 month.  >>Levaquin x 10 days   02/11/2011 Acute OV  Pt c/o recurrent sinus pressure occ x 2 months, dizzy worse in the mornings, nosebleed yesterday morning, right earache with full feeling. She has been treated with 2 courses of 10 days of abx in last 2 months without significant improvement in symptoms. Does flonase daily along saline nasal rinses . Has daily symptoms of nasal congestion, sinus pain pressure, headaches, drainage.  Not using claritin for last month. Had nose bleed yesterday which stopped with pressure.  No discolored mucus, fever , chest pain or dyspnea. Does have dry cough.  Has made an appointment for allergy consult this month with Dr. YouAnnamaria Boots   Problem List:    ALLERGIC RHINITIS (ICD-477.9) - she uses OTC Claritin, Saline  and FLONASE... she really likes the "netti pot"...  ASTHMA (ICD-493.90) - stable on ADVAIR 250Bid... Intol Dulera w/ sore throat & insomnia. ~  CXR 3/11 showed hypoaeration, basilar atx, NAD. ~  CXR 10/11 showed clear, DJD spine, NAD.  HYPERTENSION (ICD-401.9) - controlled on NORVASC 57m/d... BP today = 140/80 & BP's even better at home... She is also rec to take ASA 873md... denies HA, visual changes, CP, palipit, syncope, dyspnea, edema, etc...  HYPERCHOLESTEROLEMIA, BORDERLINE (ICD-272.4) - on diet + exercise, doesn't want meds. ~  [place] showed TChol 220, TG 279, HDL 43, LDL 122 ~  FLP 6/10 showed TChol 204, TG  167, HDL 43, LDL 123... needs better diet, get wt down. ~  FLP 6/11 showed TChol 221, TG 385, HDL 40, LDL 116... must get wt down! discussed low chol, low fat. ~  FLP 6/12 showed TChol 231, TG 187, HDL 44, LDL 149... Looks worse despite diet efforts, refer to Lipid Clinic.  GERD (ICD-530.81) - prev on Protonix but insurance won't pay, therefore switch to [place]... ?Omep caused diarrhea. ~  last EGD 9/06 showed esophagitis...  Hx Gallstones -  s/p ERCP w/ sphincterotomy 12/04... and subsequent cholecystectomy...  DIVERTICULOSIS OF COLON (ICD-562.10) IBS (ICD-564.1) COLONIC POLYPS (ICD-211.3) ~  colonoscopy 12/00 showed divertics, hems, colon polyp- adenomatous...  ~  f/u colonoscopy 3/10 by DrKaplan w/ polyp removed- adenomatous, f/u planned 5 yrs.  Hx of NEPHROLITHIASIS (ICD-592.0) - went to ER 4/10 w/ left flank pain & CT showed mild hydroneph & kidney stone... passed it on her own & followed by Urology- DrOttelin... seen 11/11 w/ right flank pain & neg eval (exam w/ tender ribs on palp),  OSTEOARTHRITIS (ICD-715.90) - she has some left hip & R>L knee discomfort...  uses Tylenol for pain... she notes prev right knee arthroscopy in 1999 by DrRendall... Rx w/ VICODIN Prn. ~  6/11:  we discussed ortho referral & will set up refer to DrOlin per request. ~  6/12:  She notes LBP w/ prev XRays showing advanced degen arthritis but she declines referral to Ortho.  FIBROMYALGIA (ICD-729.1) - takes Calcium, MVI, Vit D, & now TYLENOL Prn... hx mult somatic complaints.  VITAMIN D DEFICIENCY (ICD-268.9) ~  labs 6/10 showed Vit D level= 21... rec> Vit D 1000 u daily. ~  labs 3/11 showed Vit D level= 35... continue same.  POSTCONCUSSION SYNDROME (ICD-310.2) - [place], fully [place] & back to baseline. CERVICAL STRAIN, ACUTE (ICD-847.0) - "I do exercises and have a good pillow"... DIZZINESS (ICD-780.4) - uses MECLIZINE 59m30mn...  ANXIETY (ICD-300.00) - INTOL Ambien and Lorazepam w/  nightmares...  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   Past Surgical History  Procedure Date  . Cholecystectomy, laparoscopic 2004    for gallstone pancreatitis  . Thyroid surgery     Outpatient Encounter Prescriptions as of 02/11/2011  Medication Sig Dispense Refill  . acetaminophen (TYLENOL) 500 MG tablet Take 500 mg by mouth every 6 (six) hours as needed.        . amMarland KitchenODipine (NORVASC) 5 MG tablet TAKE 1 TABLET BY MOUTH EVERY DAY  30 tablet  3  . aspirin 81 MG EC tablet Take 81 mg by mouth daily.        . Cholecalciferol (VITAMIN D3) 1000 UNITS CAPS Take by mouth daily.        . esMarland Kitchenmeprazole (NEXIUM) 40 MG capsule Take 40 mg by mouth daily before  breakfast.        . fluticasone (FLONASE) 50 MCG/ACT nasal spray USE 2 SPRAYS IN EACH NOSTRIL TWICE A DAY  1 Act  11  . Fluticasone-Salmeterol (ADVAIR DISKUS) 250-50 MCG/DOSE AEPB Inhale 1 puff into the lungs 2 (two) times daily.  60 each  7  . meclizine (ANTIVERT) 50 MG tablet Take 25 mg by mouth 3 (three) times daily as needed.        . Multiple Vitamins-Minerals (CENTRUM SILVER PO) Take by mouth daily.        Marland Kitchen Propylene Glycol (SYSTANE BALANCE) 0.6 % SOLN Place 2 drops into both eyes 2 (two) times daily.        . sodium chloride (OCEAN) 0.65 % nasal spray 1 spray by Nasal route 2 (two) times daily as needed.        . Sodium Chloride-Sodium Bicarb (SINUS [place] NETI POT NA) by Nasal route as needed.        Marland Kitchen HYDROcodone-acetaminophen (VICODIN) 5-500 MG per tablet Take 1 tablet by mouth every 6 (six) hours as needed.        . loratadine (CLARITIN) 10 MG tablet Take 10 mg by mouth every evening.        . Omega-3 Fatty Acids (FISH OIL) 1000 MG CAPS Take 1 capsule by mouth 3 (three) times daily.       . traMADol (ULTRAM) 50 MG tablet Take 1-2 every 4 hours if still coughing on Delsym. As needed         Allergies  Allergen Reactions  . Amlodipine  Besy-Benazepril Hcl     REACTION: fatique  . Amoxicillin     REACTION: pain in right kidney  . Azithromycin     REACTION: itching  . Benicar Hct (Olmesartan Medoxomil-Hctz)     Extreme weakness  . Celexa   . Chlorzoxazone (Parafon Forte Dsc)   . Citalopram Hydrobromide     REACTION: hives  . Clonidine Derivatives   . Droperidol     REACTION: hives  . Dulera     Causes sore throat, blurred vision and unable to sleep--started on med 09-17-10  . Flexeril (Cyclobenzaprine Hcl)   . Ketorolac Tromethamine     REACTION: hives  . Morphine     REACTION: hives and itching  . Olmesartan Medoxomil     REACTION: fatique  . Reglan   . Symbicort     Causes tremors and numbness  . Toradol     Review of Systems Constitutional:   No  weight loss, night sweats,   +Fevers, chills, fatigue, or  lassitude.  HEENT:   No   Difficulty swallowing,  Tooth/dental problems, or  Sore throat,                + sneezing, itching, ear ache, nasal congestion, post nasal drip,   CV:  No chest pain,  Orthopnea, PND, swelling in lower extremities, anasarca, dizziness, palpitations, syncope.   GI  No heartburn, indigestion, abdominal pain, nausea, vomiting, diarrhea, change in bowel habits, loss of appetite, bloody stools.   Resp: No shortness of breath with exertion or at rest. No coughing up of blood.  No change in color of mucus.  No wheezing.  No chest wall deformity  Skin: no rash or lesions.  GU: no dysuria, change in color of urine, no urgency or frequency.  No flank pain, no hematuria   MS:  No joint pain or swelling.  No decreased range of motion   Psych:  No  change in mood or affect. No depression or anxiety.  No memory loss.               Objective:   Physical Exam      WD, WN, [name] in NAD... GENERAL:  Alert & oriented; pleasant & cooperative... HEENT:  [place]/AT, EOM-wnl, PERRLA, EACs-clear, TMs full NOSE: red, swollen, max tenderness, no active bleeding noted  THROAT-clear &  wnl. NECK:  Supple w/ fair ROM; no JVD; normal carotid impulses w/o bruits; no thyromegaly or nodules palpated; no lymphadenopathy. CHEST:  scat rhonchi & otherw clear, no rales or signs of consolidation... HEART:  Regular Rhythm; without murmurs/ rubs/ or gallops detected... ABDOMEN:  Soft & nontender; normal bowel sounds; no organomegaly or masses palpated... EXT: without deformities, mild arthritic changes; no varicose veins/ venous insuffic/ or edema. BACK:  she has numerous trigger point and tender spots thru the shoulders, back, chest etc... NEURO:  CN's intact; no focal neuro deficits... DERM:  No lesions noted; no rash etc...   Assessment & Plan:

## 2011-02-11 NOTE — Progress Notes (Signed)
Addended by: Miki Kins on: 02/11/2011 10:32 AM   Modules accepted: Orders

## 2011-02-11 NOTE — Assessment & Plan Note (Signed)
Recurrent sinus symptoms with no significant improvement in therapies, including 2 10 day courses of abx. Along with  Nasal steroids, antihistamines.  Will set up for CT sinus to r/o sinus dz.  Keep Allergy eval with Dr. Annamaria Boots    Plan:  Stop Claritin .  Begin Zyrtec 5m At bedtime   Flonase Nasal 2 puffs in am.  Continue with saline nasal rinses Twice daily   USe saline nasal gel At bedtime   Mucinex DM Twice daily  As needed congestion  Begin Patanase Nasal 2 puffs At bedtime  -until sample is gone.  Follow up Dr. YAnnamaria Boots As planned for Allergy evaluation  We are setting you up for CT scan of your sinuses. I will call with results.  Please contact office for sooner follow up if symptoms do not improve or worsen or seek emergency care  follow up Dr. NLenna Gilford In 2 months as planned

## 2011-02-12 ENCOUNTER — Ambulatory Visit (INDEPENDENT_AMBULATORY_CARE_PROVIDER_SITE_OTHER)
Admission: RE | Admit: 2011-02-12 | Discharge: 2011-02-12 | Disposition: A | Discharge status: Home / Self Care | Payer: Medicare Other | Source: Ambulatory Visit | Attending: Adult Health | Admitting: Adult Health

## 2011-02-12 DIAGNOSIS — J309 Allergic rhinitis, unspecified: Secondary | ICD-10-CM

## 2011-02-19 ENCOUNTER — Other Ambulatory Visit: Payer: Self-pay | Admitting: Pulmonary Disease

## 2011-03-04 ENCOUNTER — Encounter: Payer: Self-pay | Admitting: Internal Medicine

## 2011-03-04 ENCOUNTER — Ambulatory Visit (INDEPENDENT_AMBULATORY_CARE_PROVIDER_SITE_OTHER): Payer: Medicare Other | Admitting: Internal Medicine

## 2011-03-04 ENCOUNTER — Other Ambulatory Visit (INDEPENDENT_AMBULATORY_CARE_PROVIDER_SITE_OTHER): Payer: Medicare Other

## 2011-03-04 VITALS — BP 138/78 | HR 89 | Ht 61.0 in | Wt 197.4 lb

## 2011-03-04 DIAGNOSIS — Z889 Allergy status to unspecified drugs, medicaments and biological substances status: Secondary | ICD-10-CM

## 2011-03-04 DIAGNOSIS — J310 Chronic rhinitis: Secondary | ICD-10-CM

## 2011-03-04 DIAGNOSIS — J309 Allergic rhinitis, unspecified: Secondary | ICD-10-CM

## 2011-03-04 LAB — SEDIMENTATION RATE: Sed Rate: 10 mm/hr (ref 0–22)

## 2011-03-04 LAB — CBC WITH DIFFERENTIAL/PLATELET
Basophils Absolute: 0.1 10*3/uL (ref 0.0–0.1)
Eosinophils Absolute: 0.1 10*3/uL (ref 0.0–0.7)
Eosinophils Relative: 1.3 % (ref 0.0–5.0)
MCV: 86.4 fl (ref 78.0–100.0)
Monocytes Absolute: 0.8 10*3/uL (ref 0.1–1.0)
Neutrophils Relative %: 54.2 % (ref 43.0–77.0)
Platelets: 318 10*3/uL (ref 150.0–400.0)
WBC: 10 10*3/uL (ref 4.5–10.5)

## 2011-03-04 NOTE — Assessment & Plan Note (Signed)
Her history of seasonal rhinitis probably the most firm relationship of symptoms. We will send allergy profiles looking at her IgE system. I will also check a sedimentation rate against the small possibility that there is a broader vasculitis pattern. We will try Nasonex and give flu shot.

## 2011-03-04 NOTE — Patient Instructions (Addendum)
Sample Nasonex nasal steroid spray- 2  Puffs each nostril once daily at bedtime  Flu vax  Order- Allergy profile            Food allergy profile            Sed rate                         Dx rhinitis            CBC w/ diff

## 2011-03-04 NOTE — Assessment & Plan Note (Signed)
She doesn't remember specific medications or associated reactions very well. I think she is describing a variety of intolerances and assumed drug reactions. I do not expect most of this to be verifiable

## 2011-03-04 NOTE — Progress Notes (Signed)
03/04/11- 32 yoF former smoker referred courtesy of Dr [name] for allergy evaluation. She says for most of her adult life she has had seasonal rhinitis. Allergy skin testing remotely by [facility] led to allergy vaccine several decades ago. She cannot list all of the medications she has felt intolerant to. Some of them may have been associated with urticaria and itching. Food intolerance includes peanut butter/nausea, ice cream/diarrhea. Insect stings, mainly mosquitoes, caused local swelling and itching.  Since July 2012 she has had at least 4 "attacks" dizziness, frontal headache particularly lying down at night, deep cough and sneeze. She had seen Dr. Ovid Curd and our nurse practitioner. There is no lasting benefit from antibiotics. CT scan of the sinuses was negative. Between "attacks" she feels well. Triggers seem to include strong smells, wet leaves and rainy weather. Loud music makes her dizzy causes headache and discomfort around the eyes. She has had no ENT surgery and no asthma. She has had a number of emergency room trips for "allergy attack". She lives in an apartment with central air conditioning, wall-to-wall carpet, no mold and no pets.  ROS-see HPI Constitutional:   No-   weight loss, night sweats, fevers, chills, fatigue, lassitude. HEENT:   + headaches, difficulty swallowing, tooth/dental problems, sore throat,       + sneezing, itching, ear ache, nasal congestion, post nasal drip,  CV:  No-   chest pain, orthopnea, PND, swelling in lower extremities, anasarca, dizziness, palpitations Resp: No-   shortness of breath with exertion or at rest.              No-   productive cough,  No non-productive cough,  No- coughing up of blood.              No-   change in color of mucus.  No- wheezing.   Skin: No-   rash or lesions. GI:  No-   heartburn, indigestion, abdominal pain, nausea, vomiting, diarrhea,                 change in bowel habits, loss of appetite GU: No-   dysuria, change in  color of urine, no urgency or frequency.  No- flank pain. MS:  No-   joint pain or swelling.  No- decreased range of motion.  No- back pain. Neuro-     nothing unusual Psych:  No- change in mood or affect. No depression or anxiety.  No memory loss.  OBJ General- Alert, Oriented, Affect-appropriate, Distress- none acute. Pleasant and talkative, seeming a little anxious. Skin- rash-none, lesions- none, excoriation- none Lymphadenopathy- none Head- atraumatic            Eyes- Gross vision intact, PERRLA, conjunctivae clear secretions            Ears- Hearing, canals-normal            Nose- Clear, no-Septal dev, mucus, polyps, erosion, perforation             Throat- Mallampati II , mucosa clear , drainage- none, tonsils- atrophic Neck- flexible , trachea midline, no stridor , thyroid nl, carotid no bruit Chest - symmetrical excursion , unlabored           Heart/CV- RRR , no murmur , no gallop  , no rub, nl s1 s2                           - JVD- none , edema- none, stasis changes- none, varices-  none           Lung- clear to P&A, wheeze- none, cough- none , dullness-none, rub- none           Chest wall-  Abd- tender-no, distended-no, bowel sounds-present, HSM- no Br/ Gen/ Rectal- Not done, not indicated Extrem- cyanosis- none, clubbing, none, atrophy- none, strength- nl Neuro- grossly intact to observation

## 2011-03-05 LAB — ALLERGY FULL PROFILE
Bermuda Grass: <0.1 kU/L (ref <0.35)
Box Elder IgE: <0.1 kU/L (ref <0.35)
Candida Albicans: <0.1 kU/L (ref <0.35)
Curvularia lunata: <0.1 kU/L (ref <0.35)
Elm IgE: <0.1 kU/L (ref <0.35)
Fescue: <0.1 kU/L (ref <0.35)
G009 Red Top: <0.1 kU/L (ref <0.35)
Oak: <0.1 kU/L (ref <0.35)
Timothy Grass: <0.1 kU/L (ref <0.35)

## 2011-03-05 LAB — ALLERGEN FOOD PROFILE SPECIFIC IGE
Egg White IgE: <0.1 kU/L (ref <0.35)
Milk IgE: <0.1 kU/L (ref <0.35)
Orange: <0.1 kU/L (ref <0.35)
Peanut IgE: <0.1 kU/L (ref <0.35)
Shrimp IgE: <0.1 kU/L (ref <0.35)
Tuna IgE: <0.1 kU/L (ref <0.35)
Wheat IgE: <0.1 kU/L (ref <0.35)

## 2011-03-12 NOTE — Progress Notes (Signed)
Quick Note:  Spoke with patient-aware of results. ______ 

## 2011-03-21 ENCOUNTER — Ambulatory Visit (INDEPENDENT_AMBULATORY_CARE_PROVIDER_SITE_OTHER): Payer: Medicare Other | Admitting: *Deleted

## 2011-03-21 DIAGNOSIS — I10 Essential (primary) hypertension: Secondary | ICD-10-CM

## 2011-03-21 DIAGNOSIS — E785 Hyperlipidemia, unspecified: Secondary | ICD-10-CM

## 2011-03-21 LAB — HEPATIC FUNCTION PANEL
ALT: 25 U/L (ref 0–35)
Bilirubin, Direct: 0 mg/dL (ref 0.0–0.3)
Total Bilirubin: 0.7 mg/dL (ref 0.3–1.2)

## 2011-03-21 LAB — LIPID PANEL
HDL: 45.3 mg/dL (ref >39.00)
Total CHOL/HDL Ratio: 5
Triglycerides: 311 mg/dL — ABNORMAL HIGH (ref 0.0–149.0)
VLDL: 62.2 mg/dL — ABNORMAL HIGH (ref 0.0–40.0)

## 2011-03-21 LAB — LDL CHOLESTEROL, DIRECT: Direct LDL: 121.3 mg/dL

## 2011-03-28 ENCOUNTER — Ambulatory Visit (INDEPENDENT_AMBULATORY_CARE_PROVIDER_SITE_OTHER): Payer: Medicare Other

## 2011-03-28 VITALS — BP 132/80 | Wt 191.0 lb

## 2011-03-28 DIAGNOSIS — E785 Hyperlipidemia, unspecified: Secondary | ICD-10-CM

## 2011-03-28 NOTE — Assessment & Plan Note (Addendum)
Lipid values are as follows:  TC 206 (goal <200), TG 311 (goal <150), HDL 45.3 (goal >45), LDL 121.3 (goal <130).  LFTs WNL.  Patient is at goal LDL and HDL, but is above goal for TG.  I suspect this is due to uncontrolled diet and lack of exercise.  Patient seemed very ashamed of her diet and inability to maintain healthy eating habits.  I have attempted to be positive while helping her set goals for the next three months.  Patient has agreed to limit fried foods to no more than once per week.  She is also willing to resume chair exercises.  At this time we will focus solely on lifestyle modifications along with supplementation of Fish Oil.  We will follow-up in 3 months.  At this time, if TG remain elevated we can consider a fibrate.    Plan: Increase Fish Oil to three times daily with each meal.  Limit fried foods to no more than once per week Attempt to do chair exercises every other day Follow-up in 3 months

## 2011-03-28 NOTE — Patient Instructions (Signed)
1)  Increase Fish Oil to three times daily with each meal.  2)  Limit fried foods to no more than once per week 3)  Attempt to do chair exercises every other day 4)  Follow-up in 3 months  Lipid clinic: Thursday, Feb 14th @ 10:00 am Labwork: Tuesday, Feb 12th @ 9:00 am (FASTING)

## 2011-03-28 NOTE — Progress Notes (Signed)
[name] is a 70 year old female who returns to clinic for 3 month follow-up of dyslipidemia.  Current lipid regimen includes Fish Oil 1,000 mg BID.  Patient is tolerating this well with no complaints, and reports compliance.  She does report that she was told at last visit to increase Fish Oil to TID, but did not do this.    With regard to diet, pt reports that she has been off track and is ashamed of her inability to maintain a healthy diet.  She admits to eating fried foods at least 4-5 times per week, especially fried potatoes.  [employer] foods is her biggest weakness and the area in which she believes could use the most improvement.  At this time we will focus on this aspect of diet.   Patient is not participating in any routine exercise at this time.  She suffers from arthritis of both knees and states it is often painful to walk.  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  I have encouraged her to do these same chair exercises in her own apt, even if there is not interest from others in the building.   Patient does not smoke or drink.

## 2011-04-02 ENCOUNTER — Telehealth: Payer: Self-pay | Admitting: Pulmonary Disease

## 2011-04-02 MED ORDER — HYDROCHLOROTHIAZIDE 12.5 MG PO TABS: 12.5000 mg | ORAL_TABLET | Freq: Every day | ORAL | Status: DC

## 2011-04-02 MED ORDER — POTASSIUM CHLORIDE CRYS ER 20 MEQ PO TBCR: EXTENDED_RELEASE_TABLET | ORAL | Status: DC

## 2011-04-02 NOTE — Telephone Encounter (Signed)
I spoke with pt and she states she has been experiencing a lot of vertigo since July. Pt states she takes meclizine 1/2 tablet and it helps some. Pt states a friend advised her she may need to be put on a diuretic and may be retaining fluid at night. Pt states when she wakes up she feels pressure in her ears and has a lot of pressure in her forehead. Please advise Dr. [name], thanks

## 2011-04-02 NOTE — Telephone Encounter (Signed)
I spoke with pt and is aware of SN recs. Pt states she would like rx's called [facility]. I advised pt will send rx. Pt also aware to keep her apt on 12/13. Pt verbalized understanding

## 2011-04-02 NOTE — Telephone Encounter (Signed)
Per SN---will need rov with SN in [facility] like she has appt with SN on 12-13 so she will need to keep this appt.  Ok to give hctz 12.31m   #30 with 5 refills  1 daily and kcl 277m 1 daily  #30  With 5 refills.  thanks

## 2011-04-09 ENCOUNTER — Ambulatory Visit (INDEPENDENT_AMBULATORY_CARE_PROVIDER_SITE_OTHER): Payer: Medicare Other | Admitting: Internal Medicine

## 2011-04-09 ENCOUNTER — Encounter: Payer: Self-pay | Admitting: Internal Medicine

## 2011-04-09 VITALS — BP 148/80 | HR 80 | Ht 61.0 in | Wt 193.8 lb

## 2011-04-09 DIAGNOSIS — J40 Bronchitis, not specified as acute or chronic: Secondary | ICD-10-CM

## 2011-04-09 DIAGNOSIS — J309 Allergic rhinitis, unspecified: Secondary | ICD-10-CM

## 2011-04-09 DIAGNOSIS — J45909 Unspecified asthma, uncomplicated: Secondary | ICD-10-CM

## 2011-04-09 MED ORDER — METHYLPREDNISOLONE ACETATE 80 MG/ML IJ SUSP
80.0000 mg | Freq: Once | INTRAMUSCULAR | Status: AC
  Administered 2011-04-09: 80 mg via INTRAMUSCULAR

## 2011-04-09 MED ORDER — LEVALBUTEROL HCL 0.63 MG/3ML IN NEBU
0.6300 mg | INHALATION_SOLUTION | Freq: Once | RESPIRATORY_TRACT | Status: AC
  Administered 2011-04-09: 0.63 mg via RESPIRATORY_TRACT

## 2011-04-09 MED ORDER — MECLIZINE HCL 50 MG PO TABS: 25.0000 mg | ORAL_TABLET | Freq: Three times a day (TID) | ORAL | Status: DC | PRN

## 2011-04-09 NOTE — Patient Instructions (Signed)
Schedule PFT- dx asthma, bronchitis  Schedule return for allergy skin testing-  Antihistamines will block the skin tests, so you need to be off all antihistamines for 3 days before the skin tests. This includes meclizine, allegra, cough and cold preparations, and otc sleep meds. If you aren't sure please call me and ask.   Neb xop 0.63  Depo 80  Watch closely for the possibility you are refluxing stomach juice, even if you don't feel heart burn.

## 2011-04-09 NOTE — Progress Notes (Signed)
03/04/11- 1 yoF former smoker referred courtesy of Dr [name] for allergy evaluation. She says for most of her adult life she has had seasonal rhinitis. Allergy skin testing remotely by [facility] led to allergy vaccine several decades ago. She cannot list all of the medications she has felt intolerant to. Some of them may have been associated with urticaria and itching. Food intolerance includes peanut butter/nausea, ice cream/diarrhea. Insect stings, mainly mosquitoes, caused local swelling and itching.  Since July 2012 she has had at least 4 "attacks" dizziness, frontal headache particularly lying down at night, deep cough and sneeze. She had seen Dr. Ovid Curd and our nurse practitioner. There is no lasting benefit from antibiotics. CT scan of the sinuses was negative. Between "attacks" she feels well. Triggers seem to include strong smells, wet leaves and rainy weather. Loud music makes her dizzy causes headache and discomfort around the eyes. She has had no ENT surgery and no asthma. She has had a number of emergency room trips for "allergy attack". She lives in an apartment with central air conditioning, wall-to-wall carpet, no mold and no pets.  04/09/11- 13 yoF former smoker followed for allergic rhinitis, asthma, food intolerance, multiple medication intolerance. In the last day or so she has been aware of some cough with frontal headache but she has had a sense of dizziness with photophobia and sensitivity to sound probably for 2 or 3 weeks. She blames her discomfort on "allergy". I suggest to head congestion, possibly some migraine, dry and or heat and may be a viral infection could explain her symptoms better. She has had intermittent epistaxis from the left nostril. Aware of reflux mostly controlled with Nexium. Allergy profile 03/04/2011-IgE 13.5, no elevation of specific antibody levels on this panel. ESR-normal.  ROS-see HPI Constitutional:   No-   weight loss, night sweats, fevers, chills,  fatigue, lassitude. HEENT:   + headaches, difficulty swallowing, tooth/dental problems, sore throat,       + sneezing, itching, ear ache, +nasal congestion, post nasal drip,  CV:  No-   chest pain, orthopnea, PND, swelling in lower extremities, anasarca, dizziness, palpitations Resp: No-   shortness of breath with exertion or at rest.              No-   productive cough,  No non-productive cough,  No- coughing up of blood.              No-   change in color of mucus.  No- wheezing.   Skin: No-   rash or lesions. GI:  No-   heartburn, indigestion, abdominal pain, nausea, vomiting, diarrhea,                 change in bowel habits, loss of appetite GU: No-   dysuria, change in color of urine, no urgency or frequency.  No- flank pain. MS:  No-   joint pain or swelling.  No- decreased range of motion.  No- back pain. Neuro-     nothing unusual Psych:  No- change in mood or affect. No depression or anxiety.  No memory loss.  OBJ General- Alert, Oriented, Affect-appropriate, Distress- none acute. Pleasant and talkative, seeming a little anxious. Skin- rash-none, lesions- none, excoriation- none Lymphadenopathy- none Head- atraumatic            Eyes- Gross vision intact, PERRLA, conjunctivae clear secretions            Ears- Hearing, canals-normal  Nose- Crusting mucus with no blood seen, no-Septal dev, mucus, polyps, erosion, perforation             Throat- Mallampati II , mucosa red , drainage- none, tonsils- atrophic Neck- flexible , trachea midline, no stridor , thyroid nl, carotid no bruit Chest - symmetrical excursion , unlabored           Heart/CV- RRR , no murmur , no gallop  , no rub, nl s1 s2                           - JVD- none , edema- none, stasis changes- none, varices- none           Lung- clear to P&A, wheeze- none, cough-raspy, dullness-none, rub- none           Chest wall-  Abd- tender-no, distended-no, bowel sounds-present, HSM- no Br/ Gen/ Rectal- Not done, not  indicated Extrem- cyanosis- none, clubbing, none, atrophy- none, strength- nl Neuro- grossly intact to observation

## 2011-04-11 NOTE — Assessment & Plan Note (Signed)
Increased mucus, nonspecific. She wants to get allergy skin testing to recheck compared to the in vitro tests. Plan-return for allergy skin testing. Increase efforts to maintain adequate hydration and use of nasal saline.

## 2011-04-11 NOTE — Assessment & Plan Note (Addendum)
Mild exacerbation, . Emphasized reflux precautions after discussing possible relation to GERD. Plan-schedule PFT for clarification. Today give neb and depo.

## 2011-04-18 ENCOUNTER — Ambulatory Visit (INDEPENDENT_AMBULATORY_CARE_PROVIDER_SITE_OTHER): Payer: Medicare Other | Admitting: Internal Medicine

## 2011-04-18 DIAGNOSIS — J40 Bronchitis, not specified as acute or chronic: Secondary | ICD-10-CM

## 2011-04-18 DIAGNOSIS — J45909 Unspecified asthma, uncomplicated: Secondary | ICD-10-CM

## 2011-04-18 LAB — PULMONARY FUNCTION TEST

## 2011-04-18 NOTE — Progress Notes (Signed)
PFT done today. 

## 2011-04-25 ENCOUNTER — Ambulatory Visit (INDEPENDENT_AMBULATORY_CARE_PROVIDER_SITE_OTHER): Payer: Medicare Other | Admitting: Pulmonary Disease

## 2011-04-25 ENCOUNTER — Encounter: Payer: Self-pay | Admitting: Pulmonary Disease

## 2011-04-25 DIAGNOSIS — I10 Essential (primary) hypertension: Secondary | ICD-10-CM

## 2011-04-25 DIAGNOSIS — K5730 Diverticulosis of large intestine without perforation or abscess without bleeding: Secondary | ICD-10-CM

## 2011-04-25 DIAGNOSIS — K219 Gastro-esophageal reflux disease without esophagitis: Secondary | ICD-10-CM

## 2011-04-25 DIAGNOSIS — J45909 Unspecified asthma, uncomplicated: Secondary | ICD-10-CM | POA: Insufficient documentation

## 2011-04-25 DIAGNOSIS — E7800 Pure hypercholesterolemia, unspecified: Secondary | ICD-10-CM

## 2011-04-25 DIAGNOSIS — E559 Vitamin D deficiency, unspecified: Secondary | ICD-10-CM

## 2011-04-25 DIAGNOSIS — F411 Generalized anxiety disorder: Secondary | ICD-10-CM

## 2011-04-25 DIAGNOSIS — M1990 Unspecified osteoarthritis, unspecified site: Secondary | ICD-10-CM

## 2011-04-25 DIAGNOSIS — E785 Hyperlipidemia, unspecified: Secondary | ICD-10-CM

## 2011-04-25 DIAGNOSIS — IMO0001 Reserved for inherently not codable concepts without codable children: Secondary | ICD-10-CM

## 2011-04-25 MED ORDER — METHYLPREDNISOLONE ACETATE 80 MG/ML IJ SUSP
80.0000 mg | Freq: Once | INTRAMUSCULAR | Status: AC
  Administered 2011-04-25: 80 mg via INTRAMUSCULAR

## 2011-04-25 MED ORDER — PREDNISONE (PAK) 5 MG PO TABS: ORAL_TABLET | ORAL | Status: DC

## 2011-04-25 MED ORDER — HYDROCODONE-HOMATROPINE 5-1.5 MG/5ML PO SYRP: 5.0000 mL | ORAL_SOLUTION | Freq: Four times a day (QID) | ORAL | Status: AC | PRN

## 2011-04-25 MED ORDER — AMLODIPINE BESYLATE 5 MG PO TABS: 5.0000 mg | ORAL_TABLET | Freq: Every day | ORAL | Status: DC

## 2011-04-25 NOTE — Patient Instructions (Signed)
Today we updated your med list in our EPIC system...    Continue your current medications the same...  For your Asthma:    We gave you a breathing treatment today...    We gave you a Depo shot AND a Pred dosepak to take as directed over the next 6d...    Continue the Advair twice daily, and the OTC Mucinex 1-2 tabs twice daily w/ fluids!!!    We also wrote for a cough syrup to use as needed...  Call for any questions...  Let's plan a follow up visit w/ full fasting blood work & a CXR in 4-6 months.Marland KitchenMarland Kitchen

## 2011-04-28 ENCOUNTER — Other Ambulatory Visit: Payer: Self-pay | Admitting: Pulmonary Disease

## 2011-04-29 ENCOUNTER — Other Ambulatory Visit: Payer: Self-pay | Admitting: Pulmonary Disease

## 2011-04-29 DIAGNOSIS — J45909 Unspecified asthma, uncomplicated: Secondary | ICD-10-CM

## 2011-04-29 DIAGNOSIS — E7800 Pure hypercholesterolemia, unspecified: Secondary | ICD-10-CM

## 2011-04-29 DIAGNOSIS — E559 Vitamin D deficiency, unspecified: Secondary | ICD-10-CM

## 2011-04-29 DIAGNOSIS — D126 Benign neoplasm of colon, unspecified: Secondary | ICD-10-CM

## 2011-04-29 DIAGNOSIS — I10 Essential (primary) hypertension: Secondary | ICD-10-CM

## 2011-04-29 DIAGNOSIS — F411 Generalized anxiety disorder: Secondary | ICD-10-CM

## 2011-05-12 ENCOUNTER — Encounter: Payer: Self-pay | Admitting: Pulmonary Disease

## 2011-05-12 NOTE — Progress Notes (Signed)
Subjective:    Patient ID: [name], female    DOB: 1941/03/02, 70 y.o.   MRN: [id number]  HPI 71 y/o WF here for a follow up visit... he has multiple medical problems as noted below...     ~  July 19, 2009:  c/o incr asthma symptoms- chest heavy, SOB, noct cough, sneezing... she's only been doing the Advair Qam & we discussed incr to Bid + Depo80 & Dosepak... also c/o decr memory but she wants to wait on meds...  ~  October 24, 2009:  53moROV & improved using the Advair two times a day... no recent flairs, breathing good, denies cough/ phlegm/ etc... BP controlled on Norvasc;  still trying to control Chol on diet alone- weight= 188#, down 2#;  we discussed diet + exercise but she notes more trouble walking & we will refer to DrOlin to check her knee &  that she can't do water exercise due to "allergy attacks" in the pool...   ~  April 24, 2010:  661moOV- c/o right side pain,worse in the eve, deep in flank area, no rash, she thought kid stone- eval DrOttelin 11/11 neg, Mobic helped some, exam w/ tenderness over floating ribs... otherw astma OK;  BP remains stable;  Chol rx w/ diet alone & wt down 8# to 180#;  etc...  ~  October 23, 2010:  81m103moV & she notes persist mild back discomfort> she had XRays 2/12 w/ advanced DJD in TSpine & scoliosis & spondylosis in LSspine; treated w/ Mobic/ Tramadol but she has hx mult medication reactions & prefers Tylenol & an occas Vicodin prn; she says moving about after arising really helps & symptoms aren't bad enough for ortho referral yet, but she will let us [place]...    BP remains stable on Norvasc; AB under control back on Advair; Nexium really helps her GERD symptoms; she is due for fasting blood work today> FLP looks worse on her diet & I rec refer to Lipid clinic...  ~  April 25, 2011:  81mo51mo & she is c/o head & chest congestion w/ cough & sm amt clear sputum; she thinks very sens to odors & she has been eval by DrYoung for allergy testing: so far  IgE & RAST tests are neg & she is awaiting skin tests;  PFT 12/12 showed FVC=2.68 (96%), FEV1=2.18 (111%), mid-flows=108%pred; she also had Lung volumes= wnl and DLCO= wnl...  She is also followed in the Lipid Clinic on combination of diet, exercise, & Fish Oil w/ some improvement noted...   Problem List:    ALLERGIC RHINITIS (ICD-477.9) - she uses OTC Claritin, Saline and FLONASE... she really likes the "netti pot"... ~  8/12:  She was treated by TP for acute sinusitis & had Sinus XRays that were neg...  ASTHMA (ICD-493.90) - stable on ADVAIR 250Bid... Intol Dulera w/ sore throat & insomnia. ~  CXR 3/11 showed hypoaeration, basilar atx, NAD. ~  CXR 10/11 showed clear, DJD spine, NAD. ~  PFTs 12/12 showed FVC=2.68 (96%), FEV1=2.18 (111%), mid-flows=108%pred; she also had Lung volumes= wnl and DLCO= wnl... ~  12/12: presents w/ AB exac & treated w/ Depo/ Dosepak, Mucinex, Fluids, Hycodan...  HYPERTENSION (ICD-401.9) - controlled on NORVASC 5mg/51m. BP today = 132/780 & BP's even better at home... She is also rec to take ASA 81mg/18m denies HA, visual changes, CP, palipit, syncope, dyspnea, edema, etc...  HYPERCHOLESTEROLEMIA, BORDERLINE (ICD-272.4) - on diet + exercise, doesn't want meds. ~  FLP 1/[place]  showed TChol 220, TG 279, HDL 43, LDL 122 ~  FLP 6/10 showed TChol 204, TG 167, HDL 43, LDL 123... needs better diet, get wt down. ~  FLP 6/11 showed TChol 221, TG 385, HDL 40, LDL 116... must get wt down! discussed low chol, low fat. ~  FLP 6/12 showed TChol 231, TG 187, HDL 44, LDL 149... Looks worse despite diet efforts, refer to [facility] has been working w/ her on a combination of Diet, Exercise, Fish Oil...  GERD (ICD-530.81) - prev on Protonix but insurance won't pay, therefore switch to [place] 37m/d... ?Omep caused diarrhea. ~  last EGD 9/06 showed esophagitis...  Hx Gallstones -  s/p ERCP w/ sphincterotomy 12/04... and subsequent  cholecystectomy...  DIVERTICULOSIS OF COLON (ICD-562.10) IBS (ICD-564.1) COLONIC POLYPS (ICD-211.3) ~  colonoscopy 12/00 showed divertics, hems, colon polyp- adenomatous...  ~  f/u colonoscopy 3/10 by DrKaplan w/ polyp removed- adenomatous, f/u planned 5 yrs.  Hx of NEPHROLITHIASIS (ICD-592.0) - went to ER 4/10 w/ left flank pain & CT showed mild hydroneph & kidney stone... passed it on her own & followed by Urology- DrOttelin... seen 11/11 w/ right flank pain & neg eval (exam w/ tender ribs on palp),  OSTEOARTHRITIS (ICD-715.90) - she has some left hip & R>L knee discomfort...  uses Tylenol for pain... she notes prev right knee arthroscopy in 1999 by DrRendall... Rx w/ VICODIN Prn. ~  6/11:  we discussed ortho referral & will set up refer to DrOlin per request. ~  6/12:  She notes LBP w/ prev XRays showing advanced degen arthritis but she declines referral to Ortho.  FIBROMYALGIA (ICD-729.1) - takes Calcium, MVI, Vit D, & now TYLENOL Prn... hx mult somatic complaints.  VITAMIN D DEFICIENCY (ICD-268.9) ~  labs 6/10 showed Vit D level= 21... rec> Vit D 1000 u daily. ~  labs 3/11 showed Vit D level= 35... continue same.  POSTCONCUSSION SYNDROME (ICD-310.2) - [place], fully [place] & back to baseline. CERVICAL STRAIN, ACUTE (ICD-847.0) - "I do exercises and have a good pillow"... DIZZINESS (ICD-780.4) - uses MECLIZINE 268mPrn...  ANXIETY (ICD-300.00) - INTOL Ambien and Lorazepam w/ nightmares...  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   Past Surgical History  Procedure Date  . Cholecystectomy, laparoscopic 2004    for gallstone pancreatitis  . Thyroid surgery     Outpatient Encounter Prescriptions as of 04/25/2011  Medication Sig Dispense Refill  . acetaminophen (TYLENOL) 500 MG tablet Take 500 mg by mouth every 6 (six) hours as needed.        . Marland KitchenmLODipine (NORVASC) 5 MG tablet Take 1  tablet (5 mg total) by mouth daily.  30 tablet  11  . aspirin 81 MG EC tablet Take 81 mg by mouth daily.        . Cetirizine HCl (ZYRTEC ALLERGY) 10 MG CAPS Take 1 capsule by mouth daily.        . Cholecalciferol (VITAMIN D3) 1000 UNITS CAPS Take by mouth daily.        . fluticasone (FLONASE) 50 MCG/ACT nasal spray USE 2 SPRAYS IN EACH NOSTRIL TWICE A DAY  1 Act  11  . Fluticasone-Salmeterol (ADVAIR DISKUS) 250-50 MCG/DOSE AEPB Inhale 1 puff into the lungs 2 (two) times daily.  60 each  7  . hydrochlorothiazide (HYDRODIURIL) 12.5 MG tablet Take 1 tablet (12.5 mg total) by mouth daily.  30 tablet  5  .  HYDROcodone-acetaminophen (VICODIN) 5-500 MG per tablet Take 1 tablet by mouth every 6 (six) hours as needed.        . meclizine (ANTIVERT) 50 MG tablet Take 0.5 tablets (25 mg total) by mouth 3 (three) times daily as needed for dizziness or nausea.  30 tablet  3  . Multiple Vitamins-Minerals (CENTRUM SILVER PO) Take by mouth daily.        . Omega-3 Fatty Acids (FISH OIL) 1000 MG CAPS Take 1 capsule by mouth 3 (three) times daily.       . potassium chloride SA (K-DUR,KLOR-CON) 20 MEQ tablet Once a day  30 tablet  5  . Propylene Glycol (SYSTANE BALANCE) 0.6 % SOLN Place 2 drops into both eyes 2 (two) times daily.        . Sodium Chloride-Sodium Bicarb (SINUS [place] NETI POT NA) by Nasal route as needed.        . traMADol (ULTRAM) 50 MG tablet Take 1-2 every 4 hours if still coughing on Delsym. As needed       . DISCONTD: amLODipine (NORVASC) 5 MG tablet TAKE 1 TABLET BY MOUTH EVERY DAY  30 tablet  3  . DISCONTD: esomeprazole (NEXIUM) 40 MG capsule Take 40 mg by mouth daily before breakfast.        . HYDROcodone-homatropine (HYCODAN) 5-1.5 MG/5ML syrup Take 5 mLs by mouth every 6 (six) hours as needed for cough.  120 mL  0  . predniSONE (STERAPRED UNI-PAK) 5 MG TABS TAKE AS DIRECTED  1 each  0  . DISCONTD: sodium chloride (OCEAN) 0.65 % nasal spray 1 spray by Nasal route 2 (two) times daily as needed.         . methylPREDNISolone acetate (DEPO-MEDROL) injection 80 mg         Allergies  Allergen Reactions  . Amlodipine Besy-Benazepril Hcl     REACTION: fatique  . Amoxicillin     REACTION: pain in right kidney  . Azithromycin     REACTION: itching  . Benicar Hct (Olmesartan Medoxomil-Hctz)     Extreme weakness  . Celexa   . Chlorzoxazone (Parafon Forte Dsc)   . Citalopram Hydrobromide     REACTION: hives  . Clonidine Derivatives   . Droperidol     REACTION: hives  . Dulera     Causes sore throat, blurred vision and unable to sleep--started on med 09-17-10  . Flexeril (Cyclobenzaprine Hcl)   . Ketorolac Tromethamine     REACTION: hives  . Morphine     REACTION: hives and itching  . Olmesartan Medoxomil     REACTION: fatique  . Reglan   . Symbicort     Causes tremors and numbness  . Toradol     Review of Systems        See HPI - all other systems neg except as noted... The patient complains of decreased hearing, dyspnea on exertion, headaches, muscle weakness, and difficulty walking.  The patient denies anorexia, fever, weight loss, weight gain, vision loss, hoarseness, chest pain, syncope, peripheral edema, prolonged cough, hemoptysis, abdominal pain, melena, hematochezia, severe indigestion/heartburn, hematuria, incontinence, suspicious skin lesions, transient blindness, depression, unusual weight change, abnormal bleeding, enlarged lymph nodes, and angioedema.    Objective:   Physical Exam     WD, WN, [name] in NAD... GENERAL:  Alert & oriented; pleasant & cooperative... HEENT:  [place]/AT, EOM-wnl, PERRLA, EACs-clear, TMs-wnl, NOSE: sl pale, no exud; THROAT-clear & wnl. NECK:  Supple w/ fair ROM; no JVD; normal  carotid impulses w/o bruits; no thyromegaly or nodules palpated; no lymphadenopathy. CHEST:  scat rhonchi & otherw clear, no rales or signs of consolidation... HEART:  Regular Rhythm; without murmurs/ rubs/ or gallops detected... ABDOMEN:  Soft & nontender; normal  bowel sounds; no organomegaly or masses palpated... EXT: without deformities, mild arthritic changes; no varicose veins/ venous insuffic/ or edema. BACK:  she has numerous trigger point and tender spots thru the shoulders, back, chest etc... NEURO:  CN's intact; no focal neuro deficits... DERM:  No lesions noted; no rash etc...   Assessment & Plan:   AB exacerbation>  We decided to treat w/ Depo/ Dosepak, Mucinex, Fluids, Hycodan...  AR>  Under the care of DrYoung w/ eval in progress; so far w/ neg IgE & RAST and normal PFTs...  HBP>  Controlled on Norvasc, low salt diet, etc...  CHOL>  FLP looks worse & she's trying to control on diet alone> refer to Lipid Clinic...  GERD>  She notes that the Nexium really works for her...  DJD/ Back Pain/ FM>  She uses Tylenol mostly for the pain, has Vicodin for prn use, does not want referral to Ortho for back...  Anxiety>  She was intol to Lorazepam in the past...  Mult medication sensitivities>  She has an impressive list of med reactions> currently numbering 65.Marland KitchenMarland Kitchen

## 2011-05-15 ENCOUNTER — Ambulatory Visit: Payer: Medicare Other | Admitting: Internal Medicine

## 2011-06-25 ENCOUNTER — Ambulatory Visit (INDEPENDENT_AMBULATORY_CARE_PROVIDER_SITE_OTHER): Payer: Medicare Other | Admitting: *Deleted

## 2011-06-25 DIAGNOSIS — E7800 Pure hypercholesterolemia, unspecified: Secondary | ICD-10-CM

## 2011-06-25 LAB — HEPATIC FUNCTION PANEL
AST: 28 U/L (ref 0–37)
Total Bilirubin: 0.8 mg/dL (ref 0.3–1.2)

## 2011-06-25 LAB — LIPID PANEL
Cholesterol: 242 mg/dL — ABNORMAL HIGH (ref 0–200)
Total CHOL/HDL Ratio: 5
VLDL: 31.4 mg/dL (ref 0.0–40.0)

## 2011-06-25 LAB — LDL CHOLESTEROL, DIRECT: Direct LDL: 164.8 mg/dL

## 2011-06-27 ENCOUNTER — Ambulatory Visit (INDEPENDENT_AMBULATORY_CARE_PROVIDER_SITE_OTHER): Payer: Medicare Other | Admitting: Pharmacist

## 2011-06-27 DIAGNOSIS — E7800 Pure hypercholesterolemia, unspecified: Secondary | ICD-10-CM

## 2011-06-27 NOTE — Progress Notes (Signed)
[name] is a 71 year old female who returns to clinic for 3 month follow-up of dyslipidemia.  Current lipid regimen includes Fish Oil 1,000 mg TID.  Patient is tolerating this well with no complaints, and reports compliance.  With regard to diet, pt reports that she has been taking steps to eat less fried food and has been eating healthier. When she does fry her foods (now 1-2 times a week), she has been using extra virgin olive oil and oil sprays. She does remark that she has been eating a lot of eggs lately for breakfast and in her chicken salads. She is incorporating more whole wheat breads and cheerios into her diet. In regards to her lunch and dinner, she has been eating pasta but buys the garden version. She has been incorporating more frozen vegetables into her diet such as peas and carrots. She cannot eat much broccoli or cauliflower because of acid reflux and problems with gas. For sweets she has been eating canned fruits but tries to by the low sugar or light versions. She notes that she has been snacking on a lot of chips lately and would like to buy whole grain high fiber cereals to snack on instead.   Patient has been trying to increase her exercise but it is difficult because she has arthritis of both knees.  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 She has an exercise video for people with arthritis and would like to start doing that.   Patient does not smoke or drink.   Allergies  Allergen Reactions  . Amlodipine Besy-Benazepril Hcl     REACTION: fatique  . Amoxicillin     REACTION: pain in right kidney  . Azithromycin     REACTION: itching  . Benicar Hct (Olmesartan Medoxomil-Hctz)     Extreme weakness  . Celexa   . Chlorzoxazone (Parafon Forte Dsc)   . Citalopram Hydrobromide     REACTION: hives  . Clonidine Derivatives   . Droperidol     REACTION: hives  . Dulera     Causes sore throat, blurred  vision and unable to sleep--started on med 09-17-10  . Flexeril (Cyclobenzaprine Hcl)   . Ketorolac Tromethamine     REACTION: hives  . Morphine     REACTION: hives and itching  . Olmesartan Medoxomil     REACTION: fatique  . Reglan   . Symbicort     Causes tremors and numbness  . Toradol      Current Outpatient Prescriptions  Medication Sig Dispense Refill  . HYDROcodone-acetaminophen (VICODIN) 5-500 MG per tablet Take 1 tablet by mouth every 6 (six) hours as needed.        Marland Kitchen acetaminophen (TYLENOL) 500 MG tablet Take 500 mg by mouth every 6 (six) hours as needed.        Marland Kitchen amLODipine (NORVASC) 5 MG tablet Take 1 tablet (5 mg total) by mouth daily.  30 tablet  11  . aspirin 81 MG EC tablet Take 81 mg by mouth daily.        . Cetirizine HCl (ZYRTEC ALLERGY) 10 MG CAPS Take 1 capsule by mouth daily.        . Cholecalciferol (VITAMIN D3) 1000 UNITS CAPS Take by mouth daily.        . fluticasone (FLONASE) 50 MCG/ACT nasal spray USE 2 SPRAYS IN EACH NOSTRIL TWICE A DAY  1 Act  11  . Fluticasone-Salmeterol (  ADVAIR DISKUS) 250-50 MCG/DOSE AEPB Inhale 1 puff into the lungs 2 (two) times daily.  60 each  7  . hydrochlorothiazide (HYDRODIURIL) 12.5 MG tablet Take 1 tablet (12.5 mg total) by mouth daily.  30 tablet  5  . meclizine (ANTIVERT) 50 MG tablet Take 0.5 tablets (25 mg total) by mouth 3 (three) times daily as needed for dizziness or nausea.  30 tablet  3  . Multiple Vitamins-Minerals (CENTRUM SILVER PO) Take by mouth daily.        Marland Kitchen NEXIUM 40 MG capsule TAKE 1 CAPSULE BY MOUTH ONCE A DAY 30 MINUTES BEFORE THE 1ST MEAL OF THE DAY  30 capsule  10  . Omega-3 Fatty Acids (FISH OIL) 1000 MG CAPS Take 1 capsule by mouth 3 (three) times daily.       . potassium chloride SA (K-DUR,KLOR-CON) 20 MEQ tablet Once a day  30 tablet  5  . predniSONE (STERAPRED UNI-PAK) 5 MG TABS TAKE AS DIRECTED  1 each  0  . Propylene Glycol (SYSTANE BALANCE) 0.6 % SOLN Place 2 drops into both eyes 2 (two) times daily.         . Sodium Chloride-Sodium Bicarb (SINUS [place] NETI POT NA) by Nasal route as needed.        . traMADol (ULTRAM) 50 MG tablet Take 1-2 every 4 hours if still coughing on Delsym. As needed

## 2011-06-27 NOTE — Assessment & Plan Note (Addendum)
Lipid values are as follows:  TC 242 (up from 206; goal <200), TG 157 (down from 311; goal <150), HDL stayed consistent at 45.1 (goal >45), LDL 164.8 (up from 121.3; goal <130).  LFTs WNL.  Patient's TG have vastly improved with the fish oil and the decrease in the amount of fried foods.  LDL has increased significantly.   Upon further conversation with the patient, she feels that there are other stressors in her life currently that she would like to focus more upon and she would not like to follow-up with the lipid clinic anymore. Patient seemed ashamed of her diet and inability to maintain healthy eating habits and felt that she could not take more "bad news" in regards to her cholesterol.  Since pt did not want to follow up with the lipid clinic, did not suggest starting medications at this time.  Encouraged pt to follow up with Dr. [name] at her yearly physical.    We encouraged the patient that with the lifestyle changes she has been making, we have seen great improvements with her TG and would like for her to consider staying with the clinic to manage her LDL. We advised the patient that medications should be taken to control her LDL, especially with such a large increase in number. Patient was adamant about leaving the clinic and following up with her PCP, Dr [name] for primary lipid management. She assures [place] that she will continue to work on her diet and exercise program.  Because the patient will not be following up with the lipid clinic, we made it clear that management of LDL is extremely important in her case but we did not feel comfortable starting a cholesterol lowering medication without being able to follow up and monitor her. Patient understands the risk of elevated LDL and would like to follow-up with her PCP.   Plan 1. Continue to limit fried foods 2. Try to incorporate the exercise for arthritis video at least twice a week 3. Encourage patient to manage her LDL with medication when she  follows up with her PCP

## 2011-07-01 NOTE — Patient Instructions (Signed)
Follow up with Dr. [name].

## 2011-07-24 ENCOUNTER — Encounter: Payer: Self-pay | Admitting: Internal Medicine

## 2011-07-24 ENCOUNTER — Ambulatory Visit (INDEPENDENT_AMBULATORY_CARE_PROVIDER_SITE_OTHER): Payer: Medicare Other | Admitting: Internal Medicine

## 2011-07-24 VITALS — BP 150/82 | HR 95 | Ht 61.0 in | Wt 196.8 lb

## 2011-07-24 DIAGNOSIS — J309 Allergic rhinitis, unspecified: Secondary | ICD-10-CM | POA: Diagnosis not present

## 2011-07-24 DIAGNOSIS — J45909 Unspecified asthma, uncomplicated: Secondary | ICD-10-CM | POA: Diagnosis not present

## 2011-07-24 NOTE — Assessment & Plan Note (Signed)
Episodic acute bronchitis this winter, sounding more likely viral than allergic, Currently clear.

## 2011-07-24 NOTE — Patient Instructions (Signed)
We will make an allergy vaccine based on your skin test results, and have the allergy lab contact you when it is ready to start.   Meanwhile you can take antihistamine of choice

## 2011-07-24 NOTE — Progress Notes (Signed)
03/04/11- 68 yoF former smoker referred courtesy of Dr [name] for allergy evaluation. She says for most of her adult life she has had seasonal rhinitis. Allergy skin testing remotely by [place] led to allergy vaccine several decades ago. She cannot list all of the medications she has felt intolerant to. Some of them may have been associated with urticaria and itching. Food intolerance includes peanut butter/nausea, ice cream/diarrhea. Insect stings, mainly mosquitoes, caused local swelling and itching.  Since July 2012 she has had at least 4 "attacks" dizziness, frontal headache particularly lying down at night, deep cough and sneeze. She had seen Dr. Ovid Curd and our nurse practitioner. There is no lasting benefit from antibiotics. CT scan of the sinuses was negative. Between "attacks" she feels well. Triggers seem to include strong smells, wet leaves and rainy weather. Loud music makes her dizzy causes headache and discomfort around the eyes. She has had no ENT surgery and no asthma. She has had a number of emergency room trips for "allergy attack". She lives in an apartment with central air conditioning, wall-to-wall carpet, no mold and no pets.  04/09/11- 18 yoF former smoker followed for allergic rhinitis, asthma, food intolerance, multiple medication intolerance. In the last day or so she has been aware of some cough with frontal headache but she has had a sense of dizziness with photophobia and sensitivity to sound probably for 2 or 3 weeks. She blames her discomfort on "allergy". I suggested  head congestion, possibly some migraine, dry heat and maybe a viral infection could explain her symptoms better. She has had intermittent epistaxis from the left nostril. Aware of reflux mostly controlled with Nexium. Allergy profile 03/04/2011-IgE 13.5, no elevation of specific antibody levels on this panel. ESR-normal.  07/24/11-  13 yoF former smoker followed for allergic rhinitis, asthma, food intolerance,  multiple medication intolerance. Blames "allergy" for recurrent episodes of bronchitis through the winter. Asks if proximity to her wood kitchen cabinets could cause this. Told her no, and educated allergic vs viral. PFT 04/18/11- reviewed- Normal spirometry flows and lung volumes. Reduced DLCO 67 Allergy skin testing- Positive intradermal reactions to weed and tree pollens, dust mite, mold.  ROS-see HPI Constitutional:   No-   weight loss, night sweats, fevers, chills, fatigue, lassitude. HEENT:   + headaches, difficulty swallowing, tooth/dental problems, sore throat,       + sneezing, itching, ear ache, +nasal congestion, post nasal drip,  CV:  No-   chest pain, orthopnea, PND, swelling in lower extremities, anasarca, dizziness, palpitations Resp: No-   shortness of breath with exertion or at rest.              No-   productive cough,  No non-productive cough,  No- coughing up of blood.              No-   change in color of mucus.  No- wheezing.   Skin: No-   rash or lesions. GI:  No-   heartburn, indigestion, abdominal pain, nausea, vomiting,  GU: MS:  No-   joint pain or swelling.   Neuro-     nothing unusual Psych:  No- change in mood or affect. No depression or anxiety.  No memory loss.  OBJ General- Alert, Oriented, Affect-appropriate, Distress- none acute. Pleasant and talkative, over weight Skin- rash-none, lesions- none, excoriation- none Lymphadenopathy- none Head- atraumatic            Eyes- Gross vision intact, PERRLA, conjunctivae clear secretions  Ears- Hearing, canals-normal            Nose- mucus bridging, no-Septal dev, polyps, erosion, perforation             Throat- Mallampati II , mucosa red , drainage- none, tonsils- atrophic Neck- flexible , trachea midline, no stridor , thyroid nl, carotid no bruit Chest - symmetrical excursion , unlabored           Heart/CV- RRR , no murmur , no gallop  , no rub, nl s1 s2                           - JVD- none , edema-  none, stasis changes- none, varices- none           Lung- clear to P&A, wheeze- none, cough-raspy, dullness-none, rub- none           Chest wall-  Abd- Br/ Gen/ Rectal- Not done, not indicated Extrem- cyanosis- none, clubbing, none, atrophy- none, strength- nl Neuro- grossly intact to observation

## 2011-07-24 NOTE — Assessment & Plan Note (Signed)
She actively wants to try allergy vaccine again. Protocol and realistic limited potential benefit, potential allergic reaction to vaccine up to and including anaphyllaxis, sudden death were discussed. She wishes to proceed.

## 2011-07-26 ENCOUNTER — Ambulatory Visit (INDEPENDENT_AMBULATORY_CARE_PROVIDER_SITE_OTHER): Payer: Medicare Other

## 2011-07-26 DIAGNOSIS — J309 Allergic rhinitis, unspecified: Secondary | ICD-10-CM | POA: Diagnosis not present

## 2011-07-27 ENCOUNTER — Other Ambulatory Visit: Payer: Self-pay | Admitting: Pulmonary Disease

## 2011-07-29 ENCOUNTER — Encounter: Payer: Self-pay | Admitting: Internal Medicine

## 2011-07-29 ENCOUNTER — Ambulatory Visit (INDEPENDENT_AMBULATORY_CARE_PROVIDER_SITE_OTHER): Payer: Medicare Other

## 2011-07-29 DIAGNOSIS — J309 Allergic rhinitis, unspecified: Secondary | ICD-10-CM | POA: Diagnosis not present

## 2011-08-01 ENCOUNTER — Ambulatory Visit (INDEPENDENT_AMBULATORY_CARE_PROVIDER_SITE_OTHER): Payer: Medicare Other

## 2011-08-01 DIAGNOSIS — J309 Allergic rhinitis, unspecified: Secondary | ICD-10-CM

## 2011-08-05 ENCOUNTER — Ambulatory Visit (INDEPENDENT_AMBULATORY_CARE_PROVIDER_SITE_OTHER): Payer: Medicare Other

## 2011-08-05 DIAGNOSIS — J309 Allergic rhinitis, unspecified: Secondary | ICD-10-CM | POA: Diagnosis not present

## 2011-08-08 ENCOUNTER — Ambulatory Visit (INDEPENDENT_AMBULATORY_CARE_PROVIDER_SITE_OTHER): Payer: Medicare Other

## 2011-08-08 DIAGNOSIS — J309 Allergic rhinitis, unspecified: Secondary | ICD-10-CM

## 2011-08-12 ENCOUNTER — Ambulatory Visit (INDEPENDENT_AMBULATORY_CARE_PROVIDER_SITE_OTHER): Payer: Medicare Other

## 2011-08-12 DIAGNOSIS — J309 Allergic rhinitis, unspecified: Secondary | ICD-10-CM | POA: Diagnosis not present

## 2011-08-15 ENCOUNTER — Ambulatory Visit (INDEPENDENT_AMBULATORY_CARE_PROVIDER_SITE_OTHER): Payer: Medicare Other

## 2011-08-15 DIAGNOSIS — J309 Allergic rhinitis, unspecified: Secondary | ICD-10-CM

## 2011-08-19 ENCOUNTER — Ambulatory Visit (INDEPENDENT_AMBULATORY_CARE_PROVIDER_SITE_OTHER): Payer: Medicare Other

## 2011-08-19 DIAGNOSIS — J309 Allergic rhinitis, unspecified: Secondary | ICD-10-CM | POA: Diagnosis not present

## 2011-08-22 ENCOUNTER — Ambulatory Visit (INDEPENDENT_AMBULATORY_CARE_PROVIDER_SITE_OTHER): Payer: Medicare Other

## 2011-08-22 DIAGNOSIS — J309 Allergic rhinitis, unspecified: Secondary | ICD-10-CM

## 2011-08-26 ENCOUNTER — Ambulatory Visit (INDEPENDENT_AMBULATORY_CARE_PROVIDER_SITE_OTHER): Payer: Medicare Other

## 2011-08-26 DIAGNOSIS — J309 Allergic rhinitis, unspecified: Secondary | ICD-10-CM | POA: Diagnosis not present

## 2011-08-29 ENCOUNTER — Ambulatory Visit (INDEPENDENT_AMBULATORY_CARE_PROVIDER_SITE_OTHER): Payer: Medicare Other

## 2011-08-29 DIAGNOSIS — J309 Allergic rhinitis, unspecified: Secondary | ICD-10-CM

## 2011-09-02 ENCOUNTER — Ambulatory Visit (INDEPENDENT_AMBULATORY_CARE_PROVIDER_SITE_OTHER): Payer: Medicare Other

## 2011-09-02 DIAGNOSIS — J309 Allergic rhinitis, unspecified: Secondary | ICD-10-CM

## 2011-09-05 ENCOUNTER — Ambulatory Visit (INDEPENDENT_AMBULATORY_CARE_PROVIDER_SITE_OTHER): Payer: Medicare Other

## 2011-09-05 DIAGNOSIS — J309 Allergic rhinitis, unspecified: Secondary | ICD-10-CM

## 2011-09-09 ENCOUNTER — Ambulatory Visit (INDEPENDENT_AMBULATORY_CARE_PROVIDER_SITE_OTHER): Payer: Medicare Other

## 2011-09-09 DIAGNOSIS — J309 Allergic rhinitis, unspecified: Secondary | ICD-10-CM | POA: Diagnosis not present

## 2011-09-12 ENCOUNTER — Ambulatory Visit (INDEPENDENT_AMBULATORY_CARE_PROVIDER_SITE_OTHER): Payer: Medicare Other

## 2011-09-12 DIAGNOSIS — J309 Allergic rhinitis, unspecified: Secondary | ICD-10-CM | POA: Diagnosis not present

## 2011-09-16 ENCOUNTER — Ambulatory Visit (INDEPENDENT_AMBULATORY_CARE_PROVIDER_SITE_OTHER): Payer: Medicare Other

## 2011-09-16 DIAGNOSIS — J309 Allergic rhinitis, unspecified: Secondary | ICD-10-CM | POA: Diagnosis not present

## 2011-09-19 ENCOUNTER — Ambulatory Visit (INDEPENDENT_AMBULATORY_CARE_PROVIDER_SITE_OTHER): Payer: Medicare Other

## 2011-09-19 DIAGNOSIS — J309 Allergic rhinitis, unspecified: Secondary | ICD-10-CM | POA: Diagnosis not present

## 2011-09-20 ENCOUNTER — Ambulatory Visit (INDEPENDENT_AMBULATORY_CARE_PROVIDER_SITE_OTHER): Payer: Medicare Other

## 2011-09-20 DIAGNOSIS — J309 Allergic rhinitis, unspecified: Secondary | ICD-10-CM

## 2011-09-23 ENCOUNTER — Ambulatory Visit (INDEPENDENT_AMBULATORY_CARE_PROVIDER_SITE_OTHER): Payer: Medicare Other

## 2011-09-23 DIAGNOSIS — J309 Allergic rhinitis, unspecified: Secondary | ICD-10-CM

## 2011-09-26 ENCOUNTER — Ambulatory Visit (INDEPENDENT_AMBULATORY_CARE_PROVIDER_SITE_OTHER): Payer: Medicare Other

## 2011-09-26 ENCOUNTER — Ambulatory Visit: Payer: Medicare Other | Admitting: Pulmonary Disease

## 2011-09-26 DIAGNOSIS — J309 Allergic rhinitis, unspecified: Secondary | ICD-10-CM | POA: Diagnosis not present

## 2011-09-30 ENCOUNTER — Ambulatory Visit (INDEPENDENT_AMBULATORY_CARE_PROVIDER_SITE_OTHER): Payer: Medicare Other

## 2011-09-30 DIAGNOSIS — J309 Allergic rhinitis, unspecified: Secondary | ICD-10-CM

## 2011-10-03 ENCOUNTER — Ambulatory Visit (INDEPENDENT_AMBULATORY_CARE_PROVIDER_SITE_OTHER): Payer: Medicare Other

## 2011-10-03 DIAGNOSIS — J309 Allergic rhinitis, unspecified: Secondary | ICD-10-CM

## 2011-10-08 ENCOUNTER — Ambulatory Visit (INDEPENDENT_AMBULATORY_CARE_PROVIDER_SITE_OTHER): Payer: Medicare Other

## 2011-10-08 ENCOUNTER — Ambulatory Visit (INDEPENDENT_AMBULATORY_CARE_PROVIDER_SITE_OTHER): Payer: Medicare Other | Admitting: Pulmonary Disease

## 2011-10-08 ENCOUNTER — Encounter: Payer: Self-pay | Admitting: Pulmonary Disease

## 2011-10-08 VITALS — BP 160/72 | HR 85 | Temp 97.8°F | Ht 61.0 in | Wt 196.2 lb

## 2011-10-08 DIAGNOSIS — J309 Allergic rhinitis, unspecified: Secondary | ICD-10-CM

## 2011-10-08 DIAGNOSIS — I10 Essential (primary) hypertension: Secondary | ICD-10-CM | POA: Diagnosis not present

## 2011-10-08 DIAGNOSIS — K5730 Diverticulosis of large intestine without perforation or abscess without bleeding: Secondary | ICD-10-CM

## 2011-10-08 DIAGNOSIS — E785 Hyperlipidemia, unspecified: Secondary | ICD-10-CM | POA: Diagnosis not present

## 2011-10-08 DIAGNOSIS — D126 Benign neoplasm of colon, unspecified: Secondary | ICD-10-CM

## 2011-10-08 DIAGNOSIS — K589 Irritable bowel syndrome without diarrhea: Secondary | ICD-10-CM

## 2011-10-08 DIAGNOSIS — N200 Calculus of kidney: Secondary | ICD-10-CM

## 2011-10-08 DIAGNOSIS — K219 Gastro-esophageal reflux disease without esophagitis: Secondary | ICD-10-CM

## 2011-10-08 DIAGNOSIS — IMO0001 Reserved for inherently not codable concepts without codable children: Secondary | ICD-10-CM

## 2011-10-08 DIAGNOSIS — F411 Generalized anxiety disorder: Secondary | ICD-10-CM

## 2011-10-08 DIAGNOSIS — J45909 Unspecified asthma, uncomplicated: Secondary | ICD-10-CM | POA: Diagnosis not present

## 2011-10-08 DIAGNOSIS — M1990 Unspecified osteoarthritis, unspecified site: Secondary | ICD-10-CM

## 2011-10-08 MED ORDER — CLONAZEPAM 0.5 MG PO TABS: 0.5000 mg | ORAL_TABLET | Freq: Two times a day (BID) | ORAL | Status: DC | PRN

## 2011-10-08 MED ORDER — POTASSIUM CHLORIDE CRYS ER 20 MEQ PO TBCR: EXTENDED_RELEASE_TABLET | ORAL | Status: DC

## 2011-10-08 MED ORDER — ROSUVASTATIN CALCIUM 5 MG PO TABS: 5.0000 mg | ORAL_TABLET | Freq: Every day | ORAL | Status: DC

## 2011-10-08 MED ORDER — HYDROCHLOROTHIAZIDE 12.5 MG PO TABS: 12.5000 mg | ORAL_TABLET | Freq: Every day | ORAL | Status: DC
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
## 2011-10-08 NOTE — Patient Instructions (Signed)
Today we updated your med list in our EPIC system...    Continue your current medications the same...    We decided to re-start the HCTZ 12.40m daily & the KCl 261m daily...    We added KLONOPIN 0.61m71mwice daily to help w/ the generalized anxiety (cut it to 1/2 tab if necessary)...    We decided to try the CRESTOR 61mg68mow dose) for the cholesterol...  Call for any questions...  Let's plan a follow up visit in 3 months w/ FASTING blood work at that time...Marland KitchenMarland Kitchen

## 2011-10-09 ENCOUNTER — Other Ambulatory Visit: Payer: Self-pay | Admitting: Pulmonary Disease

## 2011-10-09 DIAGNOSIS — Z1231 Encounter for screening mammogram for malignant neoplasm of breast: Secondary | ICD-10-CM

## 2011-10-10 ENCOUNTER — Ambulatory Visit (INDEPENDENT_AMBULATORY_CARE_PROVIDER_SITE_OTHER): Payer: Medicare Other

## 2011-10-10 DIAGNOSIS — J309 Allergic rhinitis, unspecified: Secondary | ICD-10-CM | POA: Diagnosis not present

## 2011-10-11 ENCOUNTER — Encounter: Payer: Self-pay | Admitting: Internal Medicine

## 2011-10-15 ENCOUNTER — Ambulatory Visit (INDEPENDENT_AMBULATORY_CARE_PROVIDER_SITE_OTHER): Payer: Medicare Other | Admitting: Internal Medicine

## 2011-10-15 ENCOUNTER — Ambulatory Visit (INDEPENDENT_AMBULATORY_CARE_PROVIDER_SITE_OTHER): Payer: Medicare Other

## 2011-10-15 ENCOUNTER — Encounter: Payer: Self-pay | Admitting: Internal Medicine

## 2011-10-15 VITALS — BP 124/64 | HR 75 | Ht 61.0 in | Wt 193.8 lb

## 2011-10-15 DIAGNOSIS — J309 Allergic rhinitis, unspecified: Secondary | ICD-10-CM

## 2011-10-15 DIAGNOSIS — J45909 Unspecified asthma, uncomplicated: Secondary | ICD-10-CM | POA: Diagnosis not present

## 2011-10-15 NOTE — Progress Notes (Signed)
03/04/11- 86 yoF former smoker referred courtesy of Dr [name] for allergy evaluation. She says for most of her adult life she has had seasonal rhinitis. Allergy skin testing remotely by [place] led to allergy vaccine several decades ago. She cannot list all of the medications she has felt intolerant to. Some of them may have been associated with urticaria and itching. Food intolerance includes peanut butter/nausea, ice cream/diarrhea. Insect stings, mainly mosquitoes, caused local swelling and itching.  Since July 2012 she has had at least 4 "attacks" dizziness, frontal headache particularly lying down at night, deep cough and sneeze. She had seen Dr. Ovid Curd and our nurse practitioner. There is no lasting benefit from antibiotics. CT scan of the sinuses was negative. Between "attacks" she feels well. Triggers seem to include strong smells, wet leaves and rainy weather. Loud music makes her dizzy causes headache and discomfort around the eyes. She has had no ENT surgery and no asthma. She has had a number of emergency room trips for "allergy attack". She lives in an apartment with central air conditioning, wall-to-wall carpet, no mold and no pets.  04/09/11- 2 yoF former smoker followed for allergic rhinitis, asthma, food intolerance, multiple medication intolerance. In the last day or so she has been aware of some cough with frontal headache but she has had a sense of dizziness with photophobia and sensitivity to sound probably for 2 or 3 weeks. She blames her discomfort on "allergy". I suggested  head congestion, possibly some migraine, dry heat and maybe a viral infection could explain her symptoms better. She has had intermittent epistaxis from the left nostril. Aware of reflux mostly controlled with Nexium. Allergy profile 03/04/2011-IgE 13.5, no elevation of specific antibody levels on this panel. ESR-normal.  07/24/11-  35 yoF former smoker followed for allergic rhinitis, asthma, food intolerance,  multiple medication intolerance. Blames "allergy" for recurrent episodes of bronchitis through the winter. Asks if proximity to her wood kitchen cabinets could cause this. Told her no, and educated allergic vs viral. PFT 04/18/11- reviewed- Normal spirometry flows and lung volumes. Reduced DLCO 67 Allergy skin testing- Positive intradermal reactions to weed and tree pollens, dust mite, mold.  10/15/11-   75 yoF former smoker followed for allergic rhinitis, asthma, food intolerance, multiple medication intolerance. Feels like allergy vaccine is working well. Continues building vaccine now at 1:500. Spring and fall are usually her worst seasons and she has done well this spring.  ROS-see HPI Constitutional:   No-   weight loss, night sweats, fevers, chills, fatigue, lassitude. HEENT:   No- headaches, difficulty swallowing, tooth/dental problems, sore throat,       Minimal sneezing, itching, ear ache, +nasal congestion, post nasal drip,  CV:  No-   chest pain, orthopnea, PND, swelling in lower extremities, anasarca, dizziness, palpitations Resp: No-   shortness of breath with exertion or at rest.              No-   productive cough,  No non-productive cough,  No- coughing up of blood.              No-   change in color of mucus.  No- wheezing.   Skin: No-   rash or lesions. GI:  No-   heartburn, indigestion, abdominal pain, nausea, vomiting,  GU: MS:  No-   joint pain or swelling.   Neuro-     nothing unusual Psych:  No- change in mood or affect. No depression or anxiety.  No memory loss.  OBJ General-  Alert, Oriented, Affect-appropriate, Distress- none acute. Pleasant and talkative, over weight Skin- rash-none, lesions- none, excoriation- none Lymphadenopathy- none Head- atraumatic            Eyes- Gross vision intact, PERRLA, conjunctivae clear secretions            Ears- Hearing, canals-normal            Nose- no-mucus bridging, no-Septal dev, polyps, erosion, perforation              Throat- Mallampati II , mucosa red , drainage- none, tonsils- atrophic Neck- flexible , trachea midline, no stridor , thyroid nl, carotid no bruit Chest - symmetrical excursion , unlabored           Heart/CV- RRR , no murmur , no gallop  , no rub, nl s1 s2                           - JVD- none , edema- none, stasis changes- none, varices- none           Lung- clear to P&A, wheeze- none, cough-none, dullness-none, rub- none           Chest wall-  Abd- Br/ Gen/ Rectal- Not done, not indicated Extrem- cyanosis- none, clubbing, none, atrophy- none, strength- nl Neuro- grossly intact to observation

## 2011-10-15 NOTE — Patient Instructions (Signed)
Continue allergy vaccine  Please call as needed

## 2011-10-16 ENCOUNTER — Ambulatory Visit: Payer: Medicare Other | Admitting: Internal Medicine

## 2011-10-17 ENCOUNTER — Ambulatory Visit (INDEPENDENT_AMBULATORY_CARE_PROVIDER_SITE_OTHER): Payer: Medicare Other

## 2011-10-17 DIAGNOSIS — J309 Allergic rhinitis, unspecified: Secondary | ICD-10-CM | POA: Diagnosis not present

## 2011-10-18 ENCOUNTER — Ambulatory Visit (INDEPENDENT_AMBULATORY_CARE_PROVIDER_SITE_OTHER): Payer: Medicare Other

## 2011-10-18 DIAGNOSIS — J309 Allergic rhinitis, unspecified: Secondary | ICD-10-CM

## 2011-10-20 NOTE — Assessment & Plan Note (Signed)
Okay to continue building vaccine per protocol.

## 2011-10-20 NOTE — Assessment & Plan Note (Signed)
Continued good control.

## 2011-10-21 ENCOUNTER — Ambulatory Visit (INDEPENDENT_AMBULATORY_CARE_PROVIDER_SITE_OTHER): Payer: Medicare Other

## 2011-10-21 DIAGNOSIS — J309 Allergic rhinitis, unspecified: Secondary | ICD-10-CM | POA: Diagnosis not present

## 2011-10-24 ENCOUNTER — Ambulatory Visit (INDEPENDENT_AMBULATORY_CARE_PROVIDER_SITE_OTHER): Payer: Medicare Other

## 2011-10-24 DIAGNOSIS — J309 Allergic rhinitis, unspecified: Secondary | ICD-10-CM

## 2011-10-28 ENCOUNTER — Ambulatory Visit (INDEPENDENT_AMBULATORY_CARE_PROVIDER_SITE_OTHER): Payer: Medicare Other

## 2011-10-28 DIAGNOSIS — J309 Allergic rhinitis, unspecified: Secondary | ICD-10-CM | POA: Diagnosis not present

## 2011-10-31 ENCOUNTER — Ambulatory Visit (INDEPENDENT_AMBULATORY_CARE_PROVIDER_SITE_OTHER): Payer: Medicare Other

## 2011-10-31 DIAGNOSIS — J309 Allergic rhinitis, unspecified: Secondary | ICD-10-CM | POA: Diagnosis not present

## 2011-11-04 ENCOUNTER — Encounter: Payer: Self-pay | Admitting: Internal Medicine

## 2011-11-04 ENCOUNTER — Ambulatory Visit (INDEPENDENT_AMBULATORY_CARE_PROVIDER_SITE_OTHER): Payer: Medicare Other

## 2011-11-04 DIAGNOSIS — J309 Allergic rhinitis, unspecified: Secondary | ICD-10-CM | POA: Diagnosis not present

## 2011-11-05 ENCOUNTER — Telehealth: Payer: Self-pay | Admitting: Internal Medicine

## 2011-11-05 NOTE — Telephone Encounter (Signed)
Per CY-not normal; not likely a reaction to extremely low dose 1st injection; see if it resolves before next injection given. Pt is aware as well as Academic librarian in allergy lab. Pt will call our office back by Thursday or Friday if not feeling any better.

## 2011-11-05 NOTE — Telephone Encounter (Signed)
I spoke with pt and she stated she had her 1st full dose allergy vaccine yesterday. Today she woke up hoarse, having chest tx, dry cough, chest congestion, light increase SOB. Denies any f/c/n/v. Pt only has a bruise at the sight of injection. Pt is wanting to know if this is normal after allergy vaccine. Please advise Dr. Annamaria Boots thanks  Allergies  Allergen Reactions  . Amlodipine Besy-Benazepril Hcl     REACTION: fatique  . Amoxicillin     REACTION: pain in right kidney  . Azithromycin     REACTION: itching  . Benicar Hct (Olmesartan Medoxomil-Hctz)     Extreme weakness  . Budesonide-Formoterol Fumarate     Causes tremors and numbness  . Chlorzoxazone (Chlorzoxazone)   . Citalopram Hydrobromide     REACTION: hives  . Citalopram Hydrobromide   . Clonidine Derivatives   . Droperidol     REACTION: hives  . Flexeril (Cyclobenzaprine Hcl)   . Ketorolac Tromethamine     REACTION: hives  . Ketorolac Tromethamine   . Metoclopramide Hcl   . Mometasone Furo-Formoterol Fum     Causes sore throat, blurred vision and unable to sleep--started on med 09-17-10  . Morphine     REACTION: hives and itching  . Olmesartan Medoxomil     REACTION: fatique

## 2011-11-11 ENCOUNTER — Ambulatory Visit (INDEPENDENT_AMBULATORY_CARE_PROVIDER_SITE_OTHER): Payer: Medicare Other

## 2011-11-11 DIAGNOSIS — J309 Allergic rhinitis, unspecified: Secondary | ICD-10-CM

## 2011-11-18 ENCOUNTER — Ambulatory Visit
Admission: RE | Admit: 2011-11-18 | Discharge: 2011-11-18 | Disposition: A | Discharge status: Home / Self Care | Payer: Medicare Other | Source: Ambulatory Visit | Attending: Pulmonary Disease | Admitting: Pulmonary Disease

## 2011-11-18 ENCOUNTER — Ambulatory Visit (INDEPENDENT_AMBULATORY_CARE_PROVIDER_SITE_OTHER): Payer: Medicare Other

## 2011-11-18 DIAGNOSIS — Z1231 Encounter for screening mammogram for malignant neoplasm of breast: Secondary | ICD-10-CM

## 2011-11-18 DIAGNOSIS — J309 Allergic rhinitis, unspecified: Secondary | ICD-10-CM

## 2011-11-25 ENCOUNTER — Ambulatory Visit (INDEPENDENT_AMBULATORY_CARE_PROVIDER_SITE_OTHER): Payer: Medicare Other

## 2011-11-25 DIAGNOSIS — J309 Allergic rhinitis, unspecified: Secondary | ICD-10-CM | POA: Diagnosis not present

## 2011-12-02 ENCOUNTER — Ambulatory Visit (INDEPENDENT_AMBULATORY_CARE_PROVIDER_SITE_OTHER): Payer: Medicare Other

## 2011-12-02 DIAGNOSIS — J309 Allergic rhinitis, unspecified: Secondary | ICD-10-CM | POA: Diagnosis not present

## 2011-12-09 ENCOUNTER — Ambulatory Visit (INDEPENDENT_AMBULATORY_CARE_PROVIDER_SITE_OTHER): Payer: Medicare Other

## 2011-12-09 DIAGNOSIS — J309 Allergic rhinitis, unspecified: Secondary | ICD-10-CM

## 2011-12-16 ENCOUNTER — Ambulatory Visit (INDEPENDENT_AMBULATORY_CARE_PROVIDER_SITE_OTHER): Payer: Medicare Other

## 2011-12-16 DIAGNOSIS — J309 Allergic rhinitis, unspecified: Secondary | ICD-10-CM

## 2011-12-23 ENCOUNTER — Ambulatory Visit (INDEPENDENT_AMBULATORY_CARE_PROVIDER_SITE_OTHER): Payer: Medicare Other

## 2011-12-23 DIAGNOSIS — J309 Allergic rhinitis, unspecified: Secondary | ICD-10-CM | POA: Diagnosis not present

## 2011-12-24 ENCOUNTER — Other Ambulatory Visit: Payer: Self-pay | Admitting: Pulmonary Disease

## 2011-12-31 ENCOUNTER — Ambulatory Visit (INDEPENDENT_AMBULATORY_CARE_PROVIDER_SITE_OTHER): Payer: Medicare Other

## 2011-12-31 ENCOUNTER — Encounter: Payer: Self-pay | Admitting: Pulmonary Disease

## 2011-12-31 ENCOUNTER — Ambulatory Visit (INDEPENDENT_AMBULATORY_CARE_PROVIDER_SITE_OTHER)
Admission: RE | Admit: 2011-12-31 | Discharge: 2011-12-31 | Disposition: A | Discharge status: Home / Self Care | Payer: Medicare Other | Source: Ambulatory Visit | Attending: Pulmonary Disease | Admitting: Pulmonary Disease

## 2011-12-31 ENCOUNTER — Ambulatory Visit (INDEPENDENT_AMBULATORY_CARE_PROVIDER_SITE_OTHER): Payer: Medicare Other | Admitting: Pulmonary Disease

## 2011-12-31 ENCOUNTER — Other Ambulatory Visit (INDEPENDENT_AMBULATORY_CARE_PROVIDER_SITE_OTHER): Payer: Medicare Other

## 2011-12-31 ENCOUNTER — Telehealth: Payer: Self-pay | Admitting: Pulmonary Disease

## 2011-12-31 VITALS — BP 130/76 | HR 75 | Temp 97.5°F | Ht 61.0 in | Wt 194.8 lb

## 2011-12-31 DIAGNOSIS — J069 Acute upper respiratory infection, unspecified: Secondary | ICD-10-CM

## 2011-12-31 DIAGNOSIS — E785 Hyperlipidemia, unspecified: Secondary | ICD-10-CM

## 2011-12-31 DIAGNOSIS — N200 Calculus of kidney: Secondary | ICD-10-CM

## 2011-12-31 DIAGNOSIS — F411 Generalized anxiety disorder: Secondary | ICD-10-CM

## 2011-12-31 DIAGNOSIS — I10 Essential (primary) hypertension: Secondary | ICD-10-CM

## 2011-12-31 DIAGNOSIS — D126 Benign neoplasm of colon, unspecified: Secondary | ICD-10-CM

## 2011-12-31 DIAGNOSIS — J309 Allergic rhinitis, unspecified: Secondary | ICD-10-CM

## 2011-12-31 DIAGNOSIS — J45909 Unspecified asthma, uncomplicated: Secondary | ICD-10-CM | POA: Diagnosis not present

## 2011-12-31 DIAGNOSIS — IMO0001 Reserved for inherently not codable concepts without codable children: Secondary | ICD-10-CM

## 2011-12-31 DIAGNOSIS — R079 Chest pain, unspecified: Secondary | ICD-10-CM | POA: Diagnosis not present

## 2011-12-31 DIAGNOSIS — M1990 Unspecified osteoarthritis, unspecified site: Secondary | ICD-10-CM

## 2011-12-31 DIAGNOSIS — K5730 Diverticulosis of large intestine without perforation or abscess without bleeding: Secondary | ICD-10-CM

## 2011-12-31 DIAGNOSIS — K219 Gastro-esophageal reflux disease without esophagitis: Secondary | ICD-10-CM

## 2011-12-31 LAB — URINALYSIS
Specific Gravity, Urine: <=1.005 (ref 1.000–1.030)
Total Protein, Urine: NEGATIVE
Urine Glucose: NEGATIVE

## 2011-12-31 MED ORDER — AZITHROMYCIN 250 MG PO TABS: 250.0000 mg | ORAL_TABLET | Freq: Every day | ORAL | Status: DC

## 2011-12-31 MED ORDER — METHYLPREDNISOLONE ACETATE 80 MG/ML IJ SUSP
80.0000 mg | Freq: Once | INTRAMUSCULAR | Status: AC
  Administered 2011-12-31: 80 mg via INTRAMUSCULAR

## 2011-12-31 MED ORDER — PREDNISONE (PAK) 5 MG PO TABS: ORAL_TABLET | ORAL | Status: DC

## 2011-12-31 NOTE — Telephone Encounter (Signed)
lmomtcb x1 

## 2011-12-31 NOTE — Telephone Encounter (Signed)
Per SN--ok to add pt on to the schedule today.

## 2011-12-31 NOTE — Progress Notes (Signed)
Subjective:    Patient ID: [name], female    DOB: [birth year], 71 y.o.   MRN: [id number]  HPI 71 y/o WF here for a follow up visit... he has multiple medical problems as noted below...     ~  July 19, 2009:  c/o incr asthma symptoms- chest heavy, SOB, noct cough, sneezing... she's only been doing the Advair Qam & we discussed incr to Bid + Depo80 & Dosepak... also c/o decr memory but she wants to wait on meds...  ~  October 24, 2009:  745moROV & improved using the Advair two times a day... no recent flairs, breathing good, denies cough/ phlegm/ etc... BP controlled on Norvasc;  still trying to control Chol on diet alone- weight= 188#, down 2#;  we discussed diet + exercise but she notes more trouble walking & we will refer to DrOlin to check her knee &  that she can't do water exercise due to "allergy attacks" in the pool...   ~  April 24, 2010:  644moOV- c/o right side pain,worse in the eve, deep in flank area, no rash, she thought kid stone- eval DrOttelin 11/11 neg, Mobic helped some, exam w/ tenderness over floating ribs... otherw astma OK;  BP remains stable;  Chol rx w/ diet alone & wt down 8# to 180#;  etc...  ~  October 23, 2010:  33m24moV & she notes persist mild back discomfort> she had XRays 2/12 w/ advanced DJD in TSpine & scoliosis & spondylosis in LSspine; treated w/ Mobic/ Tramadol but she has hx mult medication reactions & prefers Tylenol & an occas Vicodin prn; she says moving about after arising really helps & symptoms aren't bad enough for ortho referral yet, but she will let us [place]...    BP remains stable on Norvasc; AB under control back on Advair; Nexium really helps her GERD symptoms; she is due for fasting blood work today> FLP looks worse on her diet & I rec refer to Lipid clinic...  ~  April 25, 2011:  377mo14mo & she is c/o head & chest congestion w/ cough & sm amt clear sputum; she thinks very sens to odors & she has been eval by DrYoung for allergy testing: so far  IgE & RAST tests are neg & she is awaiting skin tests;  PFT 12/12 showed FVC=2.68 (96%), FEV1=2.18 (111%), mid-flows=108%pred; she also had Lung volumes= wnl and DLCO= wnl...  She is also followed in the Lipid Clinic on combination of diet, exercise, & Fish Oil w/ some improvement noted...  ~  Oct 08, 2011:  45mo 7mo& [name] has had a good interval- allergy symptoms better [facility] this [facility];  She saw the folks in the [facility] on diet, lifestyle mod, etc> they have rec medication for her Chol but she was reluctant- we reviewed & she has agreed to trial CRESTOR 5mg..633mShe also admits to mod anxiety, stress, etc & would like to try something to help but didn't like alpraz in past so we discussed trial of [name] 0.5mg bi81megularly... Finally we note that her BP is sl elev but she stopped prev HCT 12.5mg whi51mwas used for dizziness (Meniere's) and her BP> rec that she restart the HCTZ 12.5mg w/ K28mdaily... We reviewed prob list, meds, xrays and labs> see below>>  ~  December 31, 2011:  77mo ROV &19mo-on appt at pt request for URI> she is  c/o nasal congestion, drainage, sneezing, & yellow mucus; she thinks "it's the dampness" but notes that allergy shots are really helping> she had IgE level 13-14, totally neg RAST tests but pos scratch tests to trees, weeds, dust mites, & mold;  She has mult food & medication intolerances... We decided to check CXR (clear); and treat w/ Depo/ Pred/ ZPak/ Mucinex/ Fluids/ Delsym...    She is also c/o urinary symptoms & wants a Urinalysis (clear, C&S- neg).    We reviewed prob list, meds, xrays and labs> see below >>    Problem List:    ALLERGIC RHINITIS (ICD-477.9) - she uses OTC Claritin, Saline and FLONASE... she really likes the "netti pot"... ~  8/12:  She was treated by TP for acute sinusitis & had Sinus XRays that were neg... ~  8/13:  Presents w/ URI, sinusitis> treated w/ Depo/ Pred/ ZPak etc...  ASTHMA  (ICD-493.90) - stable on ADVAIR 250Bid... Intol Dulera w/ sore throat & insomnia. ~  CXR 3/11 showed hypoaeration, basilar atx, NAD. ~  CXR 10/11 showed clear, DJD spine, NAD. ~  PFTs 12/12 showed FVC=2.68 (96%), FEV1=2.18 (111%), mid-flows=108%pred; she also had Lung volumes= wnl and DLCO= wnl... ~  12/12: presents w/ AB exac & treated w/ Depo/ Dosepak, Mucinex, Fluids, Hycodan... ~  CXR 8/13 showed normal heart size, clear lungs, NAD...  HYPERTENSION (ICD-401.9) - controlled on NORVASC 68m/d, HCTZ 12.58md, KCl 2036mdaily... ~  12/12:  BP= 132/780 & BP's even better at home; denies HA, visual changes, CP, palipit, syncope, dyspnea, edema, etc... ~  5/13:  BP= 160/72 off of the diuretic which she was taking primarily for dizziness (?Meniere's); I explained how this is mainly used as BP med and she will re-start Rx. ~  8/13:  BP= 130/76 & she denies CP, palpit, SOB, edema...  HYPERCHOLESTEROLEMIA, BORDERLINE (ICD-272.4) - on diet + exercise, doesn't want meds. ~  [place]08 showed TChol 220, TG 279, HDL 43, LDL 122 ~  FLP 6/10 showed TChol 204, TG 167, HDL 43, LDL 123... needs better diet, get wt down. ~  FLP 6/11 showed TChol 221, TG 385, HDL 40, LDL 116... must get wt down! discussed low chol, low fat. ~  FLP 6/12 showed TChol 231, TG 187, HDL 44, LDL 149... Looks worse despite diet efforts, [facility] been working w/ her on a combination of Diet, Exercise, Fish Oil... ~  FLPH. [place]/12 on lifestyle mod showed TChol 206, TG 311, HDL 45, LDL 121... Needs better low fat diet. ~  FLP 2/13 on lifestyle mod showed TChol 242, TG 157, HDL 45, LDL 165... We decided to start CRESTOR5.  GERD (ICD-530.81) - prev on Protonix but insurance won't pay, therefore switch to NEXOnidam75m.. ?Omep caused diarrhea. ~  last EGD 9/06 showed esophagitis...  Hx Gallstones -  s/p ERCP w/ sphincterotomy 12/04... and subsequent cholecystectomy...  DIVERTICULOSIS OF COLON (ICD-562.10) IBS  (ICD-564.1) COLONIC POLYPS (ICD-211.3) ~  colonoscopy 12/00 showed divertics, hems, colon polyp- adenomatous...  ~  f/u colonoscopy 3/10 by DrKaplan w/ polyp removed- adenomatous, f/u planned 5 yrs.  Hx of NEPHROLITHIASIS (ICD-592.0) - went to ER 4/10 w/ left flank pain & CT showed mild hydroneph & kidney stone... passed it on her own & followed by Urology- DrOttelin... seen 11/11 w/ right flank pain & neg eval (exam w/ tender ribs on palp),  OSTEOARTHRITIS (ICD-715.90) - she has some left hip & R>L knee discomfort...  uses Tylenol for pain...Marland KitchenMarland Kitchen  she notes prev right knee arthroscopy in 1999 by DrRendall... Rx w/ VICODIN Prn. ~  6/11:  we discussed ortho referral & will set up refer to DrOlin per request. ~  6/12:  She notes LBP w/ prev XRays showing advanced degen arthritis but she declines referral to Ortho.  FIBROMYALGIA (ICD-729.1) - takes Calcium, MVI, Vit D, & now TYLENOL Prn... hx mult somatic complaints.  VITAMIN D DEFICIENCY (ICD-268.9) ~  labs 6/10 showed Vit D level= 21... rec> Vit D 1000 u daily. ~  labs 3/11 showed Vit D level= 35... continue same.  POSTCONCUSSION SYNDROME (ICD-310.2) - from [place] 12/08, fully recovered & back to baseline. CERVICAL STRAIN, ACUTE (ICD-847.0) - "I do exercises and have a good pillow"... DIZZINESS (ICD-780.4) - uses MECLIZINE 59m Prn...  ANXIETY (ICD-300.00) - INTOL Ambien and Lorazepam w/ nightmares... ~  5/13:  She is under a lot of stress she says & has agreed to try medication to help; rec> [name] 0.57mbid...  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   Past Surgical History  Procedure Date  . Cholecystectomy, laparoscopic 2004    for gallstone pancreatitis  . Thyroid surgery     Outpatient Encounter Prescriptions as of 12/31/2011  Medication Sig Dispense Refill  . acetaminophen (TYLENOL) 500 MG tablet Take 500 mg by mouth every 6 (six) hours as  needed.        . Marland KitchenDVAIR DISKUS 250-50 MCG/DOSE AEPB USE 1 PUFF 2 TIMES A DAY  60 each  11  . amLODipine (NORVASC) 5 MG tablet Take 1 tablet (5 mg total) by mouth daily.  30 tablet  11  . aspirin 81 MG EC tablet Take 81 mg by mouth daily.        . Cetirizine HCl (ZYRTEC ALLERGY) 10 MG CAPS Take 1 capsule by mouth daily.        . Cholecalciferol (VITAMIN D3) 1000 UNITS CAPS Take by mouth daily.        . fluticasone (FLONASE) 50 MCG/ACT nasal spray USE 2 SPRAYS IN EACH NOSTRIL TWICE A DAY  16 g  2  . hydrochlorothiazide (HYDRODIURIL) 12.5 MG tablet Take 1 tablet (12.5 mg total) by mouth daily.  30 tablet  11  . meclizine (ANTIVERT) 50 MG tablet Take 0.5 tablets (25 mg total) by mouth 3 (three) times daily as needed for dizziness or nausea.  30 tablet  3  . Multiple Vitamins-Minerals (CENTRUM SILVER PO) Take by mouth daily.        . Marland KitchenEXIUM 40 MG capsule TAKE 1 CAPSULE BY MOUTH ONCE A DAY 30 MINUTES BEFORE THE 1ST MEAL OF THE DAY  30 capsule  10  . Omega-3 Fatty Acids (FISH OIL) 1000 MG CAPS Take 1 capsule by mouth 3 (three) times daily.       . potassium chloride SA (K-DUR,KLOR-CON) 20 MEQ tablet Once a day  30 tablet  11  . Propylene Glycol (SYSTANE BALANCE) 0.6 % SOLN Place 2 drops into both eyes 2 (two) times daily.        . Sodium Chloride-Sodium Bicarb (SINUS [place] POT NA) by Nasal route as needed.        . traMADol (ULTRAM) 50 MG tablet Take 1-2 every 4 hours if still coughing on Delsym. As needed       . DISCONTD: rosuvastatin (CRESTOR) 5 MG tablet Take 1 tablet (5 mg total) by mouth daily.  30 tablet  5  Allergies  Allergen Reactions  . Amlodipine Besy-Benazepril Hcl     REACTION: fatique  . Amoxicillin     REACTION: pain in right kidney  . Azithromycin     REACTION: itching  . Benicar Hct (Olmesartan Medoxomil-Hctz)     Extreme weakness  . Budesonide-Formoterol Fumarate     Causes tremors and numbness  . Chlorzoxazone (Chlorzoxazone)   . Citalopram Hydrobromide      REACTION: hives  . Citalopram Hydrobromide   . Clonidine Derivatives   . Droperidol     REACTION: hives  . Flexeril (Cyclobenzaprine Hcl)   . Ketorolac Tromethamine     REACTION: hives  . Ketorolac Tromethamine   . Metoclopramide Hcl   . Mometasone Furo-Formoterol Fum     Causes sore throat, blurred vision and unable to sleep--started on med 09-17-10  . Morphine     REACTION: hives and itching  . Olmesartan Medoxomil     REACTION: fatique    Current Medications, Allergies, Past Medical History, Past Surgical History, Family History, and Social History were reviewed in [employer] record.   Review of Systems        See HPI - all other systems neg except as noted... The patient complains of decreased hearing, dyspnea on exertion, headaches, muscle weakness, and difficulty walking.  The patient denies anorexia, fever, weight loss, weight gain, vision loss, hoarseness, chest pain, syncope, peripheral edema, prolonged cough, hemoptysis, abdominal pain, melena, hematochezia, severe indigestion/heartburn, hematuria, incontinence, suspicious skin lesions, transient blindness, depression, unusual weight change, abnormal bleeding, enlarged lymph nodes, and angioedema.     Objective:   Physical Exam     WD, WN, 71 y/o WF in NAD... GENERAL:  Alert & oriented; pleasant & cooperative... HEENT:  [place]/AT, EOM-wnl, PERRLA, EACs-clear, TMs-wnl, NOSE: sl pale, no exud; THROAT-clear & wnl. NECK:  Supple w/ fair ROM; no JVD; normal carotid impulses w/o bruits; no thyromegaly or nodules palpated; no lymphadenopathy. CHEST:  scat rhonchi & otherw clear, no rales or signs of consolidation... HEART:  Regular Rhythm; without murmurs/ rubs/ or gallops detected... ABDOMEN:  Soft & nontender; normal bowel sounds; no organomegaly or masses palpated... EXT: without deformities, mild arthritic changes; no varicose veins/ venous insuffic/ or edema. BACK:  she has numerous trigger point and  tender spots thru the shoulders, back, chest etc... NEURO:  CN's intact; no focal neuro deficits... DERM:  No lesions noted; no rash etc...  RADIOLOGY DATA:  Reviewed in the EPIC EMR & discussed w/ the patient...  LABORATORY DATA:  Reviewed in the EPIC EMR & discussed w/ the patient...   Assessment & Plan:    Sinusitis> we decided to treat w/ Depo/ Pred/ Doxy/ Mucinex/ Delsym/ etc...  AR/AB>  Allergy eval by DrYoung w/ neg IgE & RAST and normal PFTs; skin testing was pos for trees, weeds, dust mites, molds; she was started on vaccine & states she's had an easy spring & feels the shots are helping...  HBP>  BP is sl elev on Norvasc alone; we discussed re-starting the HCTZ + K20...  CHOL>  FLP looks worse & she was trying to control on diet alone; now willing to give low dose Statin rx a go> try CRESTOR5.  GERD>  She notes that the Nexium really works for her...  DJD/ Back Pain/ FM>  She uses Tylenol mostly for the pain, has Vicodin for prn use, does not want referral to Ortho for back...  Anxiety>  She was intol to Lorazepam in the past but  is willing to try something else for her generalized anxiety disorder> [name] 0.84mBid.  Mult medication sensitivities>  She has an impressive list of med reactions> currently numbering 158..   Patient's Medications  New Prescriptions   AZITHROMYCIN (ZITHROMAX) 250 MG TABLET    Take 1 tablet (250 mg total) by mouth daily.   PREDNISONE (STERAPRED UNI-PAK) 5 MG TABS    Take as directed ---please give a 6 day pack  Previous Medications   ACETAMINOPHEN (TYLENOL) 500 MG TABLET    Take 500 mg by mouth every 6 (six) hours as needed.     ADVAIR DISKUS 250-50 MCG/DOSE AEPB    USE 1 PUFF 2 TIMES A DAY   AMLODIPINE (NORVASC) 5 MG TABLET    Take 1 tablet (5 mg total) by mouth daily.   ASPIRIN 81 MG EC TABLET    Take 81 mg by mouth daily.     CETIRIZINE HCL (ZYRTEC ALLERGY) 10 MG CAPS    Take 1 capsule by mouth daily.     CHOLECALCIFEROL (VITAMIN D3)  1000 UNITS CAPS    Take by mouth daily.     FLUTICASONE (FLONASE) 50 MCG/ACT NASAL SPRAY    USE 2 SPRAYS IN EACH NOSTRIL TWICE A DAY   HYDROCHLOROTHIAZIDE (HYDRODIURIL) 12.5 MG TABLET    Take 1 tablet (12.5 mg total) by mouth daily.   MECLIZINE (ANTIVERT) 50 MG TABLET    Take 0.5 tablets (25 mg total) by mouth 3 (three) times daily as needed for dizziness or nausea.   MULTIPLE VITAMINS-MINERALS (CENTRUM SILVER PO)    Take by mouth daily.     NEXIUM 40 MG CAPSULE    TAKE 1 CAPSULE BY MOUTH ONCE A DAY 30 MINUTES BEFORE THE 1ST MEAL OF THE DAY   OMEGA-3 FATTY ACIDS (FISH OIL) 1000 MG CAPS    Take 1 capsule by mouth 3 (three) times daily.    POTASSIUM CHLORIDE SA (K-DUR,KLOR-CON) 20 MEQ TABLET    Once a day   PROPYLENE GLYCOL (SYSTANE BALANCE) 0.6 % SOLN    Place 2 drops into both eyes 2 (two) times daily.     SODIUM CHLORIDE-SODIUM BICARB (SINUS [place] POT NA)    by Nasal route as needed.     TRAMADOL (ULTRAM) 50 MG TABLET    Take 1-2 every 4 hours if still coughing on Delsym. As needed   Modified Medications   No medications on file  Discontinued Medications   ROSUVASTATIN (CRESTOR) 5 MG TABLET    Take 1 tablet (5 mg total) by mouth daily.

## 2011-12-31 NOTE — Telephone Encounter (Signed)
Raquel Sarna called and stated pt is over in allergy now getting her allergy vaccine. She is wanting to know if she can be seen. Pt is going to be out in the lobby once she finishes getting her vaccine. Marliss Czar is aware of this and will speak with SN

## 2011-12-31 NOTE — Patient Instructions (Addendum)
Today we updated your med list in our EPIC system...    Continue your current medications the same...  Today we did a follow up CXR & a Urinalysis to check for additional infection...    We will call you w/ these results...  For the Asthmatic bronchitis:    Take the ZPAK as directed...    We gave you a Depo shot today-       Start the Prednisone Dosepak in the AM & follow the pack directions...       Continue the Advair twice daily...  You should continue your allergy meds- Zyrtek, FLonase, Netti pot, etc...    You should get the OTC MUCINEX 1-2 tabs twice daily w/ fluids to help the thick phlegm...    And you should use the OTC DELSYM as needed for cough...  Call for any questions...  Let's plana routine follow up in 6 months, but call sooner anytime if needed for symptoms.Marland KitchenMarland Kitchen

## 2012-01-01 ENCOUNTER — Telehealth: Payer: Self-pay | Admitting: Pulmonary Disease

## 2012-01-01 NOTE — Progress Notes (Signed)
Quick Note:  Spoke with pt and notified of results per Dr. Nadel. Pt verbalized understanding and denied any questions.  ______ 

## 2012-01-01 NOTE — Telephone Encounter (Signed)
Spoke with pt and notified of results of labs and cxr per SN and she verbalized understanding, denied any questions.

## 2012-01-02 LAB — URINE CULTURE
Colony Count: NO GROWTH
Organism ID, Bacteria: NO GROWTH

## 2012-01-05 ENCOUNTER — Encounter: Payer: Self-pay | Admitting: Pulmonary Disease

## 2012-01-06 ENCOUNTER — Ambulatory Visit (INDEPENDENT_AMBULATORY_CARE_PROVIDER_SITE_OTHER): Payer: Medicare Other

## 2012-01-06 DIAGNOSIS — J309 Allergic rhinitis, unspecified: Secondary | ICD-10-CM

## 2012-01-09 ENCOUNTER — Ambulatory Visit: Payer: Medicare Other | Admitting: Pulmonary Disease

## 2012-01-14 ENCOUNTER — Ambulatory Visit (INDEPENDENT_AMBULATORY_CARE_PROVIDER_SITE_OTHER): Payer: Medicare Other

## 2012-01-14 DIAGNOSIS — J309 Allergic rhinitis, unspecified: Secondary | ICD-10-CM

## 2012-01-16 ENCOUNTER — Encounter: Payer: Self-pay | Admitting: Internal Medicine

## 2012-01-16 ENCOUNTER — Ambulatory Visit (INDEPENDENT_AMBULATORY_CARE_PROVIDER_SITE_OTHER): Payer: Medicare Other | Admitting: Internal Medicine

## 2012-01-16 VITALS — BP 158/78 | HR 74 | Ht 62.0 in | Wt 190.8 lb

## 2012-01-16 DIAGNOSIS — J309 Allergic rhinitis, unspecified: Secondary | ICD-10-CM

## 2012-01-16 DIAGNOSIS — J45909 Unspecified asthma, uncomplicated: Secondary | ICD-10-CM

## 2012-01-16 MED ORDER — ALBUTEROL SULFATE HFA 108 (90 BASE) MCG/ACT IN AERS: 2.0000 | INHALATION_SPRAY | RESPIRATORY_TRACT | Status: DC | PRN

## 2012-01-16 NOTE — Progress Notes (Signed)
01/16/12- 03/04/11- 62 yoF former smoker referred courtesy of Dr [name] for allergy evaluation. She says for most of her adult life she has had seasonal rhinitis. Allergy skin testing remotely by [place] led to allergy vaccine several decades ago. She cannot list all of the medications she has felt intolerant to. Some of them may have been associated with urticaria and itching. Food intolerance includes peanut butter/nausea, ice cream/diarrhea. Insect stings, mainly mosquitoes, caused local swelling and itching.  Since July 2012 she has had at least 4 "attacks" dizziness, frontal headache particularly lying down at night, deep cough and sneeze. She had seen Dr. Ovid Curd and our nurse practitioner. There is no lasting benefit from antibiotics. CT scan of the sinuses was negative. Between "attacks" she feels well. Triggers seem to include strong smells, wet leaves and rainy weather. Loud music makes her dizzy causes headache and discomfort around the eyes. She has had no ENT surgery and no asthma. She has had a number of emergency room trips for "allergy attack". She lives in an apartment with central air conditioning, wall-to-wall carpet, no mold and no pets.  04/09/11- 68 yoF former smoker followed for allergic rhinitis, asthma, food intolerance, multiple medication intolerance. In the last day or so she has been aware of some cough with frontal headache but she has had a sense of dizziness with photophobia and sensitivity to sound probably for 2 or 3 weeks. She blames her discomfort on "allergy". I suggested  head congestion, possibly some migraine, dry heat and maybe a viral infection could explain her symptoms better. She has had intermittent epistaxis from the left nostril. Aware of reflux mostly controlled with Nexium. Allergy profile 03/04/2011-IgE 13.5, no elevation of specific antibody levels on this panel. ESR-normal.  07/24/11-  52 yoF former smoker followed for allergic rhinitis, asthma, food  intolerance, multiple medication intolerance. Blames "allergy" for recurrent episodes of bronchitis through the winter. Asks if proximity to her wood kitchen cabinets could cause this. Told her no, and educated allergic vs viral. PFT 04/18/11- reviewed- Normal spirometry flows and lung volumes. Reduced DLCO 67 Allergy skin testing- Positive intradermal reactions to weed and tree pollens, dust mite, mold.  10/15/11-   2 yoF former smoker followed for allergic rhinitis, asthma, food intolerance, multiple medication intolerance. Feels like allergy vaccine is working well. Continues building vaccine now at 1:500. Spring and fall are usually her worst seasons and she has done well this spring.  01/16/12- 40 yoF former smoker followed for allergic rhinitis, asthma, food intolerance, multiple medication intolerance. Recent flare up of allergies-change in weather; still on vaccine and feels its working well for her. Blames rain for wheezy bronchitis and chest congestion. Says she is "really pleased" with her allergy vaccine now at 1:50 [place]. If she can control rhinitis she thinks she can prevent flareups of her asthmatic bronchitis. Wants to wait on flu vaccine.  ROS-see HPI Constitutional:   No-   weight loss, night sweats, fevers, chills, fatigue, lassitude. HEENT:   No- headaches, difficulty swallowing, tooth/dental problems, sore throat,       Minimal sneezing, itching, ear ache, +nasal congestion, post nasal drip,  CV:  No-   chest pain, orthopnea, PND, swelling in lower extremities, anasarca, dizziness, palpitations Resp: No-   shortness of breath with exertion or at rest.              No-   productive cough,  + non-productive cough,  No- coughing up of blood.  No-   change in color of mucus.  + Recent wheezing.   Skin: No-   rash or lesions. GI:  No-   heartburn, indigestion, abdominal pain, nausea, vomiting,  GU: MS:  No-   joint pain or swelling.   Neuro-     nothing unusual Psych:   No- change in mood or affect. No depression or anxiety.  No memory loss.  OBJ General- Alert, Oriented, Affect-appropriate, Distress- none acute. Pleasant and talkative, over weight Skin- rash-none, lesions- none, excoriation- none Lymphadenopathy- none Head- atraumatic            Eyes- Gross vision intact, PERRLA, conjunctivae clear secretions            Ears- Hearing, canals-normal            Nose- no-mucus bridging, no-Septal dev, polyps, erosion, perforation             Throat- Mallampati II , mucosa red , drainage- none, tonsils- atrophic Neck- flexible , trachea midline, no stridor , thyroid nl, carotid no bruit Chest - symmetrical excursion , unlabored           Heart/CV- RRR , no murmur , no gallop  , no rub, nl s1 s2                           - JVD- none , edema- none, stasis changes- none, varices- none           Lung- clear to P&A, wheeze- none, cough-none, dullness-none, rub- none           Chest wall-  Abd- Br/ Gen/ Rectal- Not done, not indicated Extrem- cyanosis- none, clubbing, none, atrophy- none, strength- nl Neuro- grossly intact to observation

## 2012-01-16 NOTE — Patient Instructions (Addendum)
Script for "rescue " inhaler to use occasionally if Advair isn't holding you through the day.  Sample Dymista nasal spray-    Try using 1-2 puffs each night at bedtime, instead of flonase.

## 2012-01-20 ENCOUNTER — Ambulatory Visit (INDEPENDENT_AMBULATORY_CARE_PROVIDER_SITE_OTHER): Payer: Medicare Other

## 2012-01-20 DIAGNOSIS — J309 Allergic rhinitis, unspecified: Secondary | ICD-10-CM

## 2012-01-21 ENCOUNTER — Ambulatory Visit (INDEPENDENT_AMBULATORY_CARE_PROVIDER_SITE_OTHER): Payer: Medicare Other | Admitting: Adult Health

## 2012-01-21 ENCOUNTER — Telehealth: Payer: Self-pay | Admitting: Pulmonary Disease

## 2012-01-21 ENCOUNTER — Encounter: Payer: Self-pay | Admitting: Adult Health

## 2012-01-21 VITALS — BP 148/80 | HR 77 | Temp 97.2°F | Ht 61.0 in | Wt 194.4 lb

## 2012-01-21 DIAGNOSIS — J45909 Unspecified asthma, uncomplicated: Secondary | ICD-10-CM

## 2012-01-21 MED ORDER — PREDNISONE 10 MG PO TABS: ORAL_TABLET | ORAL | Status: DC

## 2012-01-21 MED ORDER — HYDROCODONE-HOMATROPINE 5-1.5 MG/5ML PO SYRP: 5.0000 mL | ORAL_SOLUTION | Freq: Four times a day (QID) | ORAL | Status: AC | PRN

## 2012-01-21 MED ORDER — LEVALBUTEROL HCL 0.63 MG/3ML IN NEBU
0.6300 mg | INHALATION_SOLUTION | Freq: Once | RESPIRATORY_TRACT | Status: AC
  Administered 2012-01-21: 0.63 mg via RESPIRATORY_TRACT

## 2012-01-21 NOTE — Assessment & Plan Note (Addendum)
Mild flare with AR  xopenex neb in office   Plan  Prednisone taper over next week Mucinex DM Twice daily  As needed  Congestion  Saline nasal rinses As needed   Fluids and rest  Zyrtec daily for drainage  hydromet cough syrup As needed  , may make you sleepy  Please contact office for sooner follow up if symptoms do not improve or worsen or seek emergency care

## 2012-01-21 NOTE — Addendum Note (Signed)
Addended by: Parke Poisson E on: 01/21/2012 11:25 AM   Modules accepted: Orders

## 2012-01-21 NOTE — Progress Notes (Signed)
01/16/12- 03/04/11- 60 yoF former smoker referred courtesy of Dr [name] for allergy evaluation. She says for most of her adult life she has had seasonal rhinitis. Allergy skin testing remotely by [facility] led to allergy vaccine several decades ago. She cannot list all of the medications she has felt intolerant to. Some of them may have been associated with urticaria and itching. Food intolerance includes peanut butter/nausea, ice cream/diarrhea. Insect stings, mainly mosquitoes, caused local swelling and itching.  Since July 2012 she has had at least 4 "attacks" dizziness, frontal headache particularly lying down at night, deep cough and sneeze. She had seen Dr. Ovid Curd and our nurse practitioner. There is no lasting benefit from antibiotics. CT scan of the sinuses was negative. Between "attacks" she feels well. Triggers seem to include strong smells, wet leaves and rainy weather. Loud music makes her dizzy causes headache and discomfort around the eyes. She has had no ENT surgery and no asthma. She has had a number of emergency room trips for "allergy attack". She lives in an apartment with central air conditioning, wall-to-wall carpet, no mold and no pets.  04/09/11- 56 yoF former smoker followed for allergic rhinitis, asthma, food intolerance, multiple medication intolerance. In the last day or so she has been aware of some cough with frontal headache but she has had a sense of dizziness with photophobia and sensitivity to sound probably for 2 or 3 weeks. She blames her discomfort on "allergy". I suggested  head congestion, possibly some migraine, dry heat and maybe a viral infection could explain her symptoms better. She has had intermittent epistaxis from the left nostril. Aware of reflux mostly controlled with Nexium. Allergy profile 03/04/2011-IgE 13.5, no elevation of specific antibody levels on this panel. ESR-normal.  07/24/11-  89 yoF former smoker followed for allergic rhinitis, asthma, food  intolerance, multiple medication intolerance. Blames "allergy" for recurrent episodes of bronchitis through the winter. Asks if proximity to her wood kitchen cabinets could cause this. Told her no, and educated allergic vs viral. PFT 04/18/11- reviewed- Normal spirometry flows and lung volumes. Reduced DLCO 67 Allergy skin testing- Positive intradermal reactions to weed and tree pollens, dust mite, mold.  10/15/11-   5 yoF former smoker followed for allergic rhinitis, asthma, food intolerance, multiple medication intolerance. Feels like allergy vaccine is working well. Continues building vaccine now at 1:500. Spring and fall are usually her worst seasons and she has done well this spring.  01/16/12- 3 yoF former smoker followed for allergic rhinitis, asthma, food intolerance, multiple medication intolerance. Recent flare up of allergies-chagne in weather; still on vaccine and feels its working well for her.  01/21/2012 Acute OV  Complains hoarseness, HA, head congestion w/ clear drainage, PND, tightness in chest, dry cough, wheezing x3days. Unable to take dymista nasal spray due to HA . Tickle in throat . Post nasal drip and hoarseness  Cough is barking at times, keeping her up at night OTC cough meds not working.  CXR 3 weeks ago with NAD Seen 3 weeks ago with bronchitis , tx w/ steroid taper and doxycycline  Got better until last week when sneezing and drippy noses started .       ROS   Constitutional:   No  weight loss, night sweats,  Fevers, chills,  +fatigue, or  lassitude.  HEENT:   No headaches,  Difficulty swallowing,  Tooth/dental problems, or  Sore throat,                No sneezing, itching, ear  ache,  +nasal congestion, post nasal drip,   CV:  No chest pain,  Orthopnea, PND, swelling in lower extremities, anasarca, dizziness, palpitations, syncope.   GI  No heartburn, indigestion, abdominal pain, nausea, vomiting, diarrhea, change in bowel habits, loss of appetite, bloody  stools.   Resp:   No change in color of mucus.  No chest wall deformity  Skin: no rash or lesions.  GU: no dysuria, change in color of urine, no urgency or frequency.  No flank pain, no hematuria   MS:  No joint pain or swelling.  No decreased range of motion.  No back pain.  Psych:  No change in mood or affect. No depression or anxiety.  No memory loss.     EXAM GEN: A/Ox3; pleasant , NAD, well nourished   HEENT:  [place]/AT,  EACs-clear, TMs-wnl, NOSE-clear drainage , nontender sinus , THROAT-clear, no lesions, no postnasal drip or exudate noted.   NECK:  Supple w/ fair ROM; no JVD; normal carotid impulses w/o bruits; no thyromegaly or nodules palpated; no lymphadenopathy.  RESP  Coarse BS with barking cough no accessory muscle use, no dullness to percussion  CARD:  RRR, no m/r/g  , no peripheral edema, pulses intact, no cyanosis or clubbing.  GI:   Soft & nt; nml bowel sounds; no organomegaly or masses detected.  Musco: Warm bil, no deformities or joint swelling noted.   Neuro: alert, no focal deficits noted.    Skin: Warm, no lesions or rashes

## 2012-01-21 NOTE — Telephone Encounter (Signed)
I spoke with pt and she is scheduled to come in and see TP at 10:30 this AM to be evaluated.

## 2012-01-21 NOTE — Patient Instructions (Addendum)
Prednisone taper over next week Mucinex DM Twice daily  As needed  Congestion  Saline nasal rinses As needed   Fluids and rest  Zyrtec daily for drainage  Please contact office for sooner follow up if symptoms do not improve or worsen or seek emergency care

## 2012-01-25 NOTE — Assessment & Plan Note (Signed)
Sensitive to weather changes. Needs refill rescue inhaler. On return in the spring we will consider advancing her allergy vaccine to 1:10

## 2012-01-25 NOTE — Assessment & Plan Note (Signed)
Doing well. Would like better control of nonspecific triggers. Plan-Sample Dymista

## 2012-01-27 ENCOUNTER — Ambulatory Visit (INDEPENDENT_AMBULATORY_CARE_PROVIDER_SITE_OTHER): Payer: Medicare Other

## 2012-01-27 DIAGNOSIS — J309 Allergic rhinitis, unspecified: Secondary | ICD-10-CM

## 2012-01-28 ENCOUNTER — Encounter: Payer: Self-pay | Admitting: Internal Medicine

## 2012-02-03 ENCOUNTER — Ambulatory Visit (INDEPENDENT_AMBULATORY_CARE_PROVIDER_SITE_OTHER): Payer: Medicare Other

## 2012-02-03 DIAGNOSIS — J309 Allergic rhinitis, unspecified: Secondary | ICD-10-CM

## 2012-02-10 ENCOUNTER — Ambulatory Visit (INDEPENDENT_AMBULATORY_CARE_PROVIDER_SITE_OTHER): Payer: Medicare Other

## 2012-02-10 DIAGNOSIS — J309 Allergic rhinitis, unspecified: Secondary | ICD-10-CM

## 2012-02-19 ENCOUNTER — Ambulatory Visit (INDEPENDENT_AMBULATORY_CARE_PROVIDER_SITE_OTHER): Payer: Medicare Other

## 2012-02-19 DIAGNOSIS — J309 Allergic rhinitis, unspecified: Secondary | ICD-10-CM

## 2012-02-24 ENCOUNTER — Ambulatory Visit (INDEPENDENT_AMBULATORY_CARE_PROVIDER_SITE_OTHER): Payer: Medicare Other

## 2012-02-24 DIAGNOSIS — J309 Allergic rhinitis, unspecified: Secondary | ICD-10-CM

## 2012-02-24 DIAGNOSIS — Z23 Encounter for immunization: Secondary | ICD-10-CM

## 2012-02-25 ENCOUNTER — Ambulatory Visit (INDEPENDENT_AMBULATORY_CARE_PROVIDER_SITE_OTHER): Payer: Medicare Other

## 2012-02-25 DIAGNOSIS — J309 Allergic rhinitis, unspecified: Secondary | ICD-10-CM | POA: Diagnosis not present

## 2012-02-25 DIAGNOSIS — Z23 Encounter for immunization: Secondary | ICD-10-CM

## 2012-02-27 ENCOUNTER — Telehealth: Payer: Self-pay | Admitting: Pulmonary Disease

## 2012-02-27 NOTE — Telephone Encounter (Signed)
Per SN----use cool compresses to the area, benadryl, 2 po  Qid, tylenol, rest at home.  thanks

## 2012-02-27 NOTE — Telephone Encounter (Signed)
Pt is aware of SN recs. Nothing further was needed

## 2012-02-27 NOTE — Telephone Encounter (Signed)
Patient states that she had her Flu and Allergy vaccines on Monday and she noticed Tuesday that she has a red ring and knot around the injection site for the flu vaccine.  Pt previously instructed to use Tylenol and cold compresses. Pt states that the heat in the area is getting worse and she has now dizzy. Would like SN recs on what to do further since the spot is getting worse.   SN please advise. Thanks.   Allergies  Allergen Reactions  . Amlodipine Besy-Benazepril Hcl     REACTION: fatique  . Amoxicillin     REACTION: pain in right kidney  . Benicar Hct (Olmesartan Medoxomil-Hctz)     Extreme weakness  . Budesonide-Formoterol Fumarate     Causes tremors and numbness  . Chlorzoxazone (Chlorzoxazone)   . Citalopram Hydrobromide     REACTION: hives  . Citalopram Hydrobromide   . Clonidine Derivatives   . Droperidol     REACTION: hives  . Flexeril (Cyclobenzaprine Hcl)   . Ketorolac Tromethamine     REACTION: hives  . Ketorolac Tromethamine   . Metoclopramide Hcl   . Mometasone Furo-Formoterol Fum     Causes sore throat, blurred vision and unable to sleep--started on med 09-17-10  . Morphine     REACTION: hives and itching  . Olmesartan Medoxomil     REACTION: fatique

## 2012-03-02 ENCOUNTER — Ambulatory Visit (INDEPENDENT_AMBULATORY_CARE_PROVIDER_SITE_OTHER): Payer: Medicare Other

## 2012-03-02 DIAGNOSIS — J309 Allergic rhinitis, unspecified: Secondary | ICD-10-CM | POA: Diagnosis not present

## 2012-03-09 ENCOUNTER — Ambulatory Visit (INDEPENDENT_AMBULATORY_CARE_PROVIDER_SITE_OTHER): Payer: Medicare Other

## 2012-03-09 DIAGNOSIS — J309 Allergic rhinitis, unspecified: Secondary | ICD-10-CM

## 2012-03-17 ENCOUNTER — Ambulatory Visit (INDEPENDENT_AMBULATORY_CARE_PROVIDER_SITE_OTHER): Payer: Medicare Other

## 2012-03-17 DIAGNOSIS — J309 Allergic rhinitis, unspecified: Secondary | ICD-10-CM

## 2012-03-23 ENCOUNTER — Ambulatory Visit (INDEPENDENT_AMBULATORY_CARE_PROVIDER_SITE_OTHER): Payer: Medicare Other

## 2012-03-23 DIAGNOSIS — J309 Allergic rhinitis, unspecified: Secondary | ICD-10-CM

## 2012-03-24 ENCOUNTER — Other Ambulatory Visit: Payer: Self-pay | Admitting: Pulmonary Disease

## 2012-03-30 ENCOUNTER — Ambulatory Visit (INDEPENDENT_AMBULATORY_CARE_PROVIDER_SITE_OTHER): Payer: Medicare Other

## 2012-03-30 DIAGNOSIS — J309 Allergic rhinitis, unspecified: Secondary | ICD-10-CM | POA: Diagnosis not present

## 2012-04-13 ENCOUNTER — Ambulatory Visit (INDEPENDENT_AMBULATORY_CARE_PROVIDER_SITE_OTHER): Payer: Medicare Other

## 2012-04-13 DIAGNOSIS — J309 Allergic rhinitis, unspecified: Secondary | ICD-10-CM

## 2012-04-21 ENCOUNTER — Ambulatory Visit (INDEPENDENT_AMBULATORY_CARE_PROVIDER_SITE_OTHER): Payer: Medicare Other

## 2012-04-21 DIAGNOSIS — J309 Allergic rhinitis, unspecified: Secondary | ICD-10-CM

## 2012-04-22 ENCOUNTER — Other Ambulatory Visit: Payer: Self-pay | Admitting: Pulmonary Disease

## 2012-04-24 ENCOUNTER — Other Ambulatory Visit: Payer: Self-pay | Admitting: Pulmonary Disease

## 2012-04-27 ENCOUNTER — Ambulatory Visit (INDEPENDENT_AMBULATORY_CARE_PROVIDER_SITE_OTHER): Payer: Medicare Other

## 2012-04-27 DIAGNOSIS — J309 Allergic rhinitis, unspecified: Secondary | ICD-10-CM

## 2012-05-04 ENCOUNTER — Ambulatory Visit (INDEPENDENT_AMBULATORY_CARE_PROVIDER_SITE_OTHER): Payer: Medicare Other

## 2012-05-04 DIAGNOSIS — J309 Allergic rhinitis, unspecified: Secondary | ICD-10-CM | POA: Diagnosis not present

## 2012-05-11 ENCOUNTER — Ambulatory Visit (INDEPENDENT_AMBULATORY_CARE_PROVIDER_SITE_OTHER): Payer: Medicare Other

## 2012-05-11 DIAGNOSIS — J309 Allergic rhinitis, unspecified: Secondary | ICD-10-CM

## 2012-05-12 ENCOUNTER — Encounter: Payer: Self-pay | Admitting: Internal Medicine

## 2012-05-13 LAB — HM MAMMOGRAPHY

## 2012-05-19 ENCOUNTER — Ambulatory Visit: Payer: Medicare Other | Admitting: Internal Medicine

## 2012-05-25 ENCOUNTER — Ambulatory Visit (INDEPENDENT_AMBULATORY_CARE_PROVIDER_SITE_OTHER): Payer: Medicare Other

## 2012-05-25 DIAGNOSIS — J309 Allergic rhinitis, unspecified: Secondary | ICD-10-CM | POA: Diagnosis not present

## 2012-06-01 ENCOUNTER — Ambulatory Visit (INDEPENDENT_AMBULATORY_CARE_PROVIDER_SITE_OTHER): Payer: Medicare Other

## 2012-06-01 DIAGNOSIS — J309 Allergic rhinitis, unspecified: Secondary | ICD-10-CM

## 2012-06-08 ENCOUNTER — Ambulatory Visit: Payer: Medicare Other

## 2012-06-08 ENCOUNTER — Ambulatory Visit (INDEPENDENT_AMBULATORY_CARE_PROVIDER_SITE_OTHER): Payer: Medicare Other

## 2012-06-08 DIAGNOSIS — J309 Allergic rhinitis, unspecified: Secondary | ICD-10-CM

## 2012-06-15 ENCOUNTER — Ambulatory Visit: Payer: Medicare Other

## 2012-06-16 ENCOUNTER — Ambulatory Visit (INDEPENDENT_AMBULATORY_CARE_PROVIDER_SITE_OTHER): Payer: Medicare Other

## 2012-06-16 DIAGNOSIS — J309 Allergic rhinitis, unspecified: Secondary | ICD-10-CM

## 2012-06-22 ENCOUNTER — Ambulatory Visit: Payer: Medicare Other

## 2012-06-22 ENCOUNTER — Ambulatory Visit (INDEPENDENT_AMBULATORY_CARE_PROVIDER_SITE_OTHER): Payer: Medicare Other

## 2012-06-22 DIAGNOSIS — J309 Allergic rhinitis, unspecified: Secondary | ICD-10-CM | POA: Diagnosis not present

## 2012-06-23 ENCOUNTER — Ambulatory Visit: Payer: Medicare Other | Admitting: Internal Medicine

## 2012-06-23 ENCOUNTER — Other Ambulatory Visit: Payer: Self-pay | Admitting: Pulmonary Disease

## 2012-06-26 ENCOUNTER — Ambulatory Visit: Payer: Medicare Other | Admitting: Internal Medicine

## 2012-06-29 ENCOUNTER — Ambulatory Visit (INDEPENDENT_AMBULATORY_CARE_PROVIDER_SITE_OTHER): Payer: Medicare Other

## 2012-06-29 ENCOUNTER — Ambulatory Visit: Payer: Medicare Other

## 2012-06-29 DIAGNOSIS — J309 Allergic rhinitis, unspecified: Secondary | ICD-10-CM

## 2012-07-02 ENCOUNTER — Other Ambulatory Visit (INDEPENDENT_AMBULATORY_CARE_PROVIDER_SITE_OTHER): Payer: Medicare Other

## 2012-07-02 ENCOUNTER — Encounter: Payer: Self-pay | Admitting: Pulmonary Disease

## 2012-07-02 ENCOUNTER — Ambulatory Visit (INDEPENDENT_AMBULATORY_CARE_PROVIDER_SITE_OTHER): Payer: Medicare Other | Admitting: Pulmonary Disease

## 2012-07-02 VITALS — BP 140/70 | HR 79 | Temp 97.8°F | Ht 61.0 in | Wt 194.8 lb

## 2012-07-02 DIAGNOSIS — K219 Gastro-esophageal reflux disease without esophagitis: Secondary | ICD-10-CM

## 2012-07-02 DIAGNOSIS — J45909 Unspecified asthma, uncomplicated: Secondary | ICD-10-CM

## 2012-07-02 DIAGNOSIS — I10 Essential (primary) hypertension: Secondary | ICD-10-CM

## 2012-07-02 DIAGNOSIS — E559 Vitamin D deficiency, unspecified: Secondary | ICD-10-CM

## 2012-07-02 DIAGNOSIS — M1990 Unspecified osteoarthritis, unspecified site: Secondary | ICD-10-CM

## 2012-07-02 DIAGNOSIS — K5730 Diverticulosis of large intestine without perforation or abscess without bleeding: Secondary | ICD-10-CM

## 2012-07-02 DIAGNOSIS — F411 Generalized anxiety disorder: Secondary | ICD-10-CM

## 2012-07-02 DIAGNOSIS — N200 Calculus of kidney: Secondary | ICD-10-CM

## 2012-07-02 DIAGNOSIS — IMO0001 Reserved for inherently not codable concepts without codable children: Secondary | ICD-10-CM

## 2012-07-02 DIAGNOSIS — D126 Benign neoplasm of colon, unspecified: Secondary | ICD-10-CM

## 2012-07-02 DIAGNOSIS — J309 Allergic rhinitis, unspecified: Secondary | ICD-10-CM

## 2012-07-02 DIAGNOSIS — E785 Hyperlipidemia, unspecified: Secondary | ICD-10-CM

## 2012-07-02 LAB — BASIC METABOLIC PANEL
BUN: 22 mg/dL (ref 6–23)
Calcium: 9.3 mg/dL (ref 8.4–10.5)
GFR: 76.78 mL/min (ref >60.00)
Glucose, Bld: 103 mg/dL — ABNORMAL HIGH (ref 70–99)

## 2012-07-02 LAB — LIPID PANEL
Cholesterol: 191 mg/dL (ref 0–200)
HDL: 37.5 mg/dL — ABNORMAL LOW (ref >39.00)
Triglycerides: 275 mg/dL — ABNORMAL HIGH (ref 0.0–149.0)

## 2012-07-02 LAB — CBC WITH DIFFERENTIAL/PLATELET
Eosinophils Relative: 1.5 % (ref 0.0–5.0)
HCT: 44.9 % (ref 36.0–46.0)
Hemoglobin: 15.2 g/dL — ABNORMAL HIGH (ref 12.0–15.0)
Lymphs Abs: 4.7 10*3/uL — ABNORMAL HIGH (ref 0.7–4.0)
Monocytes Relative: 9.1 % (ref 3.0–12.0)
Neutro Abs: 4.4 10*3/uL (ref 1.4–7.7)
Platelets: 307 10*3/uL (ref 150.0–400.0)
WBC: 10.2 10*3/uL (ref 4.5–10.5)

## 2012-07-02 LAB — TSH: TSH: 1 u[IU]/mL (ref 0.35–5.50)

## 2012-07-02 LAB — LDL CHOLESTEROL, DIRECT: Direct LDL: 102.5 mg/dL

## 2012-07-02 LAB — HEPATIC FUNCTION PANEL: Albumin: 4.1 g/dL (ref 3.5–5.2)

## 2012-07-02 NOTE — Progress Notes (Signed)
Subjective:    Patient ID: [name], female    DOB: 10-10-1940, 72 y.o.   MRN: [id number]  HPI 72 y/o WF here for a follow up visit... he has multiple medical problems as noted below...     ~  April 25, 2011:  40moROV & she is c/o head & chest congestion w/ cough & sm amt clear sputum; she thinks very sens to odors & she has been eval by DrYoung for allergy testing: so far IgE & RAST tests are neg & she is awaiting skin tests;  PFT 12/12 showed FVC=2.68 (96%), FEV1=2.18 (111%), mid-flows=108%pred; she also had Lung volumes= wnl and DLCO= wnl...  She is also followed in [facility] on combination of diet, exercise, & Fish Oil w/ some improvement noted...  ~  Oct 08, 2011:  591moOV & [name] has had a good interval- allergy symptoms better [facility] no major difficulty this spring;  She saw the folks in the [facility] on diet, lifestyle mod, etc> they have rec medication for her Chol but she was reluctant- we reviewed & she has agreed to trial CRESTOR 78m50m.  She also admits to mod anxiety, stress, etc & would like to try something to help but didn't like alpraz in past so we discussed trial of KLONOPIN 0.78mg64md regularly... Finally we note that her BP is sl elev but she stopped prev HCT 12.78mg 9mch was used for dizziness (Meniere's) and her BP> rec that she restart the HCTZ 12.78mg w53m20 daily... We reviewed prob list, meds, xrays and labs> see below>>  ~  December 31, 2011:  71mo RO44moadd-on appt at pt request for URI> she is c/o nasal congestion, drainage, sneezing, & yellow mucus; she thinks "it's the dampness" but notes that allergy shots are really helping> she had IgE level 13-14, totally neg RAST tests but pos scratch tests to trees, weeds, dust mites, & mold;  She has mult food & medication intolerances... We decided to check CXR (clear); and treat w/ Depo/ Pred/ ZPak/ Mucinex/ Fluids/ Delsym...    She is also c/o urinary symptoms & wants a Urinalysis  (clear, C&S- neg).    We reviewed prob list, meds, xrays and labs> see below >>   ~  July 02, 2012:  28mo ROV52moondra has done well w/ her CC being early CTS in the AMs- we discussed the etiology & offered further eval w/ NCVs vs Rx w/ wrist splints Qhs & she will try the latter... We reviewed the following medical problems during today's office visit >>     AR, Asthma> on Zyrtek, Flonase, Advair250, ProairHFA; off prev Pred- breathing is at baseline w/o cough, sput, hemoptysis, SOB, edema, etc; she is working to avoid exac...    HBP> on Amlod5, Hct12.5, K20;  BP= 140/70 & she denies CP, palpit, dizzy, SOB, edema; rec to incr exercise program...    Lipids> on Fish Oil, she is INTOL to all statins etc;  FLP 2/14 shows TChol 191, TG 275, HDL 38, LDL 103 & we discussed a better low fat diet...    GI- GERD, Divertics, IBS, Polyps> on Nexium40; she denies abd pain, n/v, c/d, blood seen; last colon was 3/10 by DrKaplan w/ adenomatous polyp removed; f/u 63yrs.   58yrKidney stones> she passed a left renal stone in 2010 & saw DrOttelin; no recurrence...    DJD, FM, VitD defic> on Tramadol50, OTC meds, MVI &  VitD1000; she has seen DrOlin & told her knee is bone-on-bone but she doesn't want surg...    Anxiety> not currently on anxiolytic rx... We reviewed prob list, meds, xrays and labs> see below for updates >> she had the 2013 Flu vaccine 10/13... LABS 2/14:  FLP- chol looks ok but TG=275 & rec low fat diet;  Chems- wnl;  CBC- wnl;  TSH=1.00;  VitD=49...         Problem List:    ALLERGIC RHINITIS (ICD-477.9) - she uses OTC Claritin, Saline and FLONASE... she really likes the "netti pot"... ~  2012: Allergy eval by DrYoung w/ neg IgE & RAST and normal PFTs; skin testing was pos for trees, weeds, dust mites, molds; she was started on vaccine & states she's had an easy spring & feels the shots are helping. ~  8/12:  She was treated by TP for acute sinusitis & had Sinus XRays that were neg... ~  8/13:   Presents w/ URI, sinusitis> treated w/ Depo/ Pred/ ZPak etc...  ASTHMA (ICD-493.90) - stable on ADVAIR 250Bid... Intol Dulera w/ sore throat & insomnia. ~  CXR 3/11 showed hypoaeration, basilar atx, NAD. ~  CXR 10/11 showed clear, DJD spine, NAD. ~  PFTs 12/12 showed FVC=2.68 (96%), FEV1=2.18 (111%), mid-flows=108%pred; she also had Lung volumes= wnl and DLCO= wnl... ~  12/12: presents w/ AB exac & treated w/ Depo/ Dosepak, Mucinex, Fluids, Hycodan... ~  CXR 8/13 showed normal heart size, clear lungs, NAD... ~  2/14: on Zyrtek, Flonase, [phone], ProairHFA; off prev Pred- breathing is at baseline w/o cough, sput, hemoptysis, SOB, edema, etc; she is working to avoid exac  HYPERTENSION (ICD-401.9) - controlled on NORVASC 44m/d, HCTZ 12.577md, KCl 2027mdaily... ~  12/12:  BP= 132/780 & BP's even better at home; denies HA, visual changes, CP, palipit, syncope, dyspnea, edema, etc... ~  5/13:  BP= 160/72 off of the diuretic which she was taking primarily for dizziness (?Meniere's); I explained how this is mainly used as BP med and she will re-start Rx. ~  8/13:  BP= 130/76 & she denies CP, palpit, SOB, edema... ~  2/14: on Amlod5, Hct12.5, K20;  BP= 140/70 & she denies CP, palpit, dizzy, SOB, edema; rec to incr exercise program.   HYPERCHOLESTEROLEMIA, BORDERLINE (ICD-272.4) - on diet + exercise, doesn't want meds. ~  FLPS[place]08 showed TChol 220, TG 279, HDL 43, LDL 122 ~  FLP 6/10 showed TChol 204, TG 167, HDL 43, LDL 123... needs better diet, get wt down. ~  FLP 6/11 showed TChol 221, TG 385, HDL 40, LDL 116... must get wt down! discussed low chol, low fat. ~  FLP 6/12 showed TChol 231, TG 187, HDL 44, LDL 149... Looks worse despite diet efforts, [facility] been working w/ her on a combination of Diet, Exercise, Fish Oil... ~  FLPBrocket/12 on lifestyle mod showed TChol 206, TG 311, HDL 45, LDL 121... Needs better low fat diet. ~  FLP 2/13 on lifestyle mod showed TChol  242, TG 157, HDL 45, LDL 165... We decided to start [place]  FLPL[place]14 on diet alone showed TChol 191, TG 275, HDL 38, LDL 103; she is INTOL to all Chol meds...  GERD (ICD-530.81) - prev on Protonix but insurance won't pay, therefore switch to NEXAnawaltm60m.. ?Omep caused diarrhea. ~  last EGD 9/06 showed esophagitis...  Hx Gallstones -  s/p ERCP w/ sphincterotomy 12/04... and subsequent cholecystectomy...  DIVERTICULOSIS OF  COLON (ICD-562.10) IBS (ICD-564.1) COLONIC POLYPS (ICD-211.3) ~  colonoscopy 12/00 showed divertics, hems, colon polyp- adenomatous...  ~  f/u colonoscopy 3/10 by DrKaplan w/ polyp removed- adenomatous, f/u planned 5 yrs.  Hx of NEPHROLITHIASIS (ICD-592.0) - went to ER 4/10 w/ left flank pain & CT showed mild hydroneph & kidney stone... passed it on her own & followed by Urology- DrOttelin... seen 11/11 w/ right flank pain & neg eval (exam w/ tender ribs on palp),  OSTEOARTHRITIS (ICD-715.90) - she has some left hip & R>L knee discomfort...  uses Tylenol for pain... she notes prev right knee arthroscopy in 1999 by DrRendall... Rx w/ VICODIN Prn. ~  6/11:  we discussed ortho referral & will set up refer to DrOlin per request. ~  6/12:  She notes LBP w/ prev XRays showing advanced degen arthritis but she declines referral to Ortho. ~  2/14: on Tramadol50, OTC meds, MVI & VitD1000; she has seen DrOlin & told her knee is bone-on-bone but she doesn't want surg.  FIBROMYALGIA (ICD-729.1) - takes Calcium, MVI, Vit D, & now TYLENOL Prn... hx mult somatic complaints.  VITAMIN D DEFICIENCY (ICD-268.9) ~  labs 6/10 showed Vit D level= 21... rec> Vit D 1000 u daily. ~  labs 3/11 showed Vit D level= 35... continue same. ~  Labs 2/14 showed Vit D level = 49  POSTCONCUSSION SYNDROME (ICD-310.2) - from [facility] 12/08, fully recovered & back to baseline. CERVICAL STRAIN, ACUTE (ICD-847.0) - "I do exercises and have a good pillow"... DIZZINESS (ICD-780.4) - uses MECLIZINE 84m  Prn...  ANXIETY (ICD-300.00) - INTOL Ambien and Lorazepam w/ nightmares... ~  5/13:  She is under a lot of stress she says & has agreed to try medication to help; rec> KLONOPIN 0.549mbid... ~  2/14: she does not list any anxiolytic med at this time...  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   Past Surgical History  Procedure Laterality Date  . Cholecystectomy, laparoscopic  2004    for gallstone pancreatitis  . Thyroid surgery      Outpatient Encounter Prescriptions as of 07/02/2012  Medication Sig Dispense Refill  . acetaminophen (TYLENOL) 500 MG tablet Take 500 mg by mouth every 6 (six) hours as needed.        . Marland KitchenDVAIR DISKUS 250-50 MCG/DOSE AEPB USE 1 PUFF 2 TIMES A DAY  60 each  11  . albuterol (PROVENTIL HFA;VENTOLIN HFA) 108 (90 BASE) MCG/ACT inhaler Inhale 2 puffs into the lungs every 4 (four) hours as needed for wheezing or shortness of breath. RESCUE  1 Inhaler  6  . amLODipine (NORVASC) 5 MG tablet TAKE 1 TABLET BY MOUTH EVERY DAY  30 tablet  11  . aspirin 81 MG EC tablet Take 81 mg by mouth daily.        . Cetirizine HCl (ZYRTEC ALLERGY) 10 MG CAPS Take 1 capsule by mouth daily.        . Cholecalciferol (VITAMIN D3) 1000 UNITS CAPS Take by mouth daily.        . fluticasone (FLONASE) 50 MCG/ACT nasal spray USE 2 SPRAYS IN EACH NOSTRIL TWICE A DAY  16 g  1  . hydrochlorothiazide (HYDRODIURIL) 12.5 MG tablet Take 1 tablet (12.5 mg total) by mouth daily.  30 tablet  11  . Multiple Vitamins-Minerals (CENTRUM SILVER PO) Take by mouth daily.        . Marland KitchenEXIUM 40 MG capsule TAKE 1 CAPSULE BY MOUTH  ONCE A DAY 30 MINUTES BEFORE THE 1ST MEAL OF THE DAY  30 capsule  2  . Omega-3 Fatty Acids (FISH OIL) 1000 MG CAPS Take 1 capsule by mouth 3 (three) times daily.       . potassium chloride SA (K-DUR,KLOR-CON) 20 MEQ tablet Once a day  30 tablet  11  . Propylene Glycol (SYSTANE BALANCE) 0.6 % SOLN Place 2  drops into both eyes 2 (two) times daily.        . Sodium Chloride-Sodium Bicarb (SINUS [place] NETI POT NA) by Nasal route as needed.        . traMADol (ULTRAM) 50 MG tablet Take 1-2 every 4 hours if still coughing on Delsym. As needed       . [DISCONTINUED] predniSONE (DELTASONE) 10 MG tablet 4 tabs for 2 days, then 3 tabs for 2 days, 2 tabs for 2 days, then 1 tab for 2 days, then stop  20 tablet  0   No facility-administered encounter medications on file as of 07/02/2012.    Allergies  Allergen Reactions  . Amlodipine Besy-Benazepril Hcl     REACTION: fatique  . Amoxicillin     REACTION: pain in right kidney  . Benicar Hct (Olmesartan Medoxomil-Hctz)     Extreme weakness  . Budesonide-Formoterol Fumarate     Causes tremors and numbness  . Chlorzoxazone (Chlorzoxazone)   . Citalopram Hydrobromide     REACTION: hives  . Citalopram Hydrobromide   . Clonidine Derivatives   . Droperidol     REACTION: hives  . Flexeril (Cyclobenzaprine Hcl)   . Ketorolac Tromethamine     REACTION: hives  . Ketorolac Tromethamine   . Metoclopramide Hcl   . Mometasone Furo-Formoterol Fum     Causes sore throat, blurred vision and unable to sleep--started on med 09-17-10  . Morphine     REACTION: hives and itching  . Olmesartan Medoxomil     REACTION: fatique    Current Medications, Allergies, Past Medical History, Past Surgical History, Family History, and Social History were reviewed in [employer] record.   Review of Systems        See HPI - all other systems neg except as noted... The patient complains of decreased hearing, dyspnea on exertion, headaches, muscle weakness, and difficulty walking.  The patient denies anorexia, fever, weight loss, weight gain, vision loss, hoarseness, chest pain, syncope, peripheral edema, prolonged cough, hemoptysis, abdominal pain, melena, hematochezia, severe indigestion/heartburn, hematuria, incontinence, suspicious skin lesions, transient  blindness, depression, unusual weight change, abnormal bleeding, enlarged lymph nodes, and angioedema.     Objective:   Physical Exam     WD, WN, 72 y/o WF in NAD... GENERAL:  Alert & oriented; pleasant & cooperative... HEENT:  [place]/AT, EOM-wnl, PERRLA, EACs-clear, TMs-wnl, NOSE: sl pale, no exud; THROAT-clear & wnl. NECK:  Supple w/ fair ROM; no JVD; normal carotid impulses w/o bruits; no thyromegaly or nodules palpated; no lymphadenopathy. CHEST:  scat rhonchi & otherw clear, no rales or signs of consolidation... HEART:  Regular Rhythm; without murmurs/ rubs/ or gallops detected... ABDOMEN:  Soft & nontender; normal bowel sounds; no organomegaly or masses palpated... EXT: without deformities, mild arthritic changes; no varicose veins/ venous insuffic/ or edema. BACK:  she has numerous trigger point and tender spots thru the shoulders, back, chest etc... NEURO:  CN's intact; no focal neuro deficits... DERM:  No lesions noted; no rash etc...  RADIOLOGY DATA:  Reviewed in the EPIC EMR & discussed w/ the patient.Marland KitchenMarland Kitchen  LABORATORY DATA:  Reviewed in the EPIC EMR & discussed w/ the patient...   Assessment & Plan:    AR/AB>  Allergy eval by DrYoung w/ neg IgE & RAST and normal PFTs; skin testing was pos for trees, weeds, dust mites, molds; she was started on vaccine & states she's had an easy spring & feels the shots are helping...  HBP>  BP is controlled on Norvasc & HCT; continue same + diet/ exercise/ wt reduction...  CHOL>  She is INTOL to all statins and on diet + FishOil alone; we reviewed low fat diet & exerc program...  GERD>  She notes that the Nexium really works for her...  DJD/ Back Pain/ FM>  She uses Tylenol mostly for the pain, has Tramadol for prn use, does not want referral to Ortho for back...  Anxiety>  She was intol to Lorazepam in the past, tried Klonopin, but doesn't stick w/ any med...  Mult medication sensitivities>  She has an impressive list of med reactions>  currently numbering 29...   Patient's Medications  New Prescriptions   No medications on file  Previous Medications   ACETAMINOPHEN (TYLENOL) 500 MG TABLET    Take 500 mg by mouth every 6 (six) hours as needed.     ADVAIR DISKUS 250-50 MCG/DOSE AEPB    USE 1 PUFF 2 TIMES A DAY   ALBUTEROL (PROVENTIL HFA;VENTOLIN HFA) 108 (90 BASE) MCG/ACT INHALER    Inhale 2 puffs into the lungs every 4 (four) hours as needed for wheezing or shortness of breath. RESCUE   AMLODIPINE (NORVASC) 5 MG TABLET    TAKE 1 TABLET BY MOUTH EVERY DAY   ASPIRIN 81 MG EC TABLET    Take 81 mg by mouth daily.     CETIRIZINE HCL (ZYRTEC ALLERGY) 10 MG CAPS    Take 1 capsule by mouth daily.     CHOLECALCIFEROL (VITAMIN D3) 1000 UNITS CAPS    Take by mouth daily.     FLUTICASONE (FLONASE) 50 MCG/ACT NASAL SPRAY    USE 2 SPRAYS IN EACH NOSTRIL TWICE A DAY   HYDROCHLOROTHIAZIDE (HYDRODIURIL) 12.5 MG TABLET    Take 1 tablet (12.5 mg total) by mouth daily.   MECLIZINE (ANTIVERT) 25 MG TABLET    Take 25 mg by mouth 3 (three) times daily as needed for dizziness.   MULTIPLE VITAMINS-MINERALS (CENTRUM SILVER PO)    Take by mouth daily.     NEXIUM 40 MG CAPSULE    TAKE 1 CAPSULE BY MOUTH ONCE A DAY 30 MINUTES BEFORE THE 1ST MEAL OF THE DAY   OMEGA-3 FATTY ACIDS (FISH OIL) 1000 MG CAPS    Take 1 capsule by mouth 3 (three) times daily.    POTASSIUM CHLORIDE SA (K-DUR,KLOR-CON) 20 MEQ TABLET    Once a day   PROPYLENE GLYCOL (SYSTANE BALANCE) 0.6 % SOLN    Place 2 drops into both eyes 2 (two) times daily.     SODIUM CHLORIDE-SODIUM BICARB (SINUS [place] NETI POT NA)    by Nasal route as needed.     TRAMADOL (ULTRAM) 50 MG TABLET    Take 1-2 every 4 hours if still coughing on Delsym. As needed   Modified Medications   No medications on file  Discontinued Medications   PREDNISONE (DELTASONE) 10 MG TABLET    4 tabs for 2 days, then 3 tabs for 2 days, 2 tabs for 2 days, then 1 tab for 2 days, then stop

## 2012-07-02 NOTE — Patient Instructions (Addendum)
Today we updated your med list in our EPIC system...    Continue your current medications the same...  Today we did your follow up FASTING blood work...    We will contact you w/ the results when avail...  Call for any questions...  Let's plan a follow up visit in 6 months.Marland KitchenMarland Kitchen

## 2012-07-06 ENCOUNTER — Ambulatory Visit: Payer: Medicare Other

## 2012-07-07 ENCOUNTER — Ambulatory Visit (INDEPENDENT_AMBULATORY_CARE_PROVIDER_SITE_OTHER): Payer: Medicare Other

## 2012-07-07 DIAGNOSIS — J309 Allergic rhinitis, unspecified: Secondary | ICD-10-CM

## 2012-07-13 ENCOUNTER — Ambulatory Visit: Payer: Medicare Other

## 2012-07-15 ENCOUNTER — Ambulatory Visit: Payer: Medicare Other

## 2012-07-15 ENCOUNTER — Ambulatory Visit (INDEPENDENT_AMBULATORY_CARE_PROVIDER_SITE_OTHER): Payer: Medicare Other

## 2012-07-15 DIAGNOSIS — J309 Allergic rhinitis, unspecified: Secondary | ICD-10-CM

## 2012-07-17 ENCOUNTER — Ambulatory Visit (INDEPENDENT_AMBULATORY_CARE_PROVIDER_SITE_OTHER): Payer: Medicare Other

## 2012-07-17 DIAGNOSIS — J309 Allergic rhinitis, unspecified: Secondary | ICD-10-CM | POA: Diagnosis not present

## 2012-07-21 ENCOUNTER — Ambulatory Visit: Payer: Medicare Other

## 2012-07-22 ENCOUNTER — Ambulatory Visit (INDEPENDENT_AMBULATORY_CARE_PROVIDER_SITE_OTHER): Payer: Medicare Other

## 2012-07-22 DIAGNOSIS — J309 Allergic rhinitis, unspecified: Secondary | ICD-10-CM | POA: Diagnosis not present

## 2012-07-27 ENCOUNTER — Ambulatory Visit: Payer: Medicare Other

## 2012-08-07 ENCOUNTER — Encounter: Payer: Self-pay | Admitting: Internal Medicine

## 2012-08-07 ENCOUNTER — Ambulatory Visit (INDEPENDENT_AMBULATORY_CARE_PROVIDER_SITE_OTHER): Payer: Medicare Other

## 2012-08-07 ENCOUNTER — Ambulatory Visit (INDEPENDENT_AMBULATORY_CARE_PROVIDER_SITE_OTHER): Payer: Medicare Other | Admitting: Internal Medicine

## 2012-08-07 VITALS — BP 140/80 | HR 92 | Ht 63.0 in | Wt 194.4 lb

## 2012-08-07 DIAGNOSIS — J45909 Unspecified asthma, uncomplicated: Secondary | ICD-10-CM | POA: Diagnosis not present

## 2012-08-07 DIAGNOSIS — J309 Allergic rhinitis, unspecified: Secondary | ICD-10-CM

## 2012-08-07 NOTE — Progress Notes (Signed)
01/16/12- 03/04/11- 1 yoF former smoker referred courtesy of Dr [name] for allergy evaluation. She says for most of her adult life she has had seasonal rhinitis. Allergy skin testing remotely by [place] led to allergy vaccine several decades ago. She cannot list all of the medications she has felt intolerant to. Some of them may have been associated with urticaria and itching. Food intolerance includes peanut butter/nausea, ice cream/diarrhea. Insect stings, mainly mosquitoes, caused local swelling and itching.  Since July 2012 she has had at least 4 "attacks" dizziness, frontal headache particularly lying down at night, deep cough and sneeze. She had seen Dr. Ovid Curd and our nurse practitioner. There is no lasting benefit from antibiotics. CT scan of the sinuses was negative. Between "attacks" she feels well. Triggers seem to include strong smells, wet leaves and rainy weather. Loud music makes her dizzy causes headache and discomfort around the eyes. She has had no ENT surgery and no asthma. She has had a number of emergency room trips for "allergy attack". She lives in an apartment with central air conditioning, wall-to-wall carpet, no mold and no pets.  04/09/11- 62 yoF former smoker followed for allergic rhinitis, asthma, food intolerance, multiple medication intolerance. In the last day or so she has been aware of some cough with frontal headache but she has had a sense of dizziness with photophobia and sensitivity to sound probably for 2 or 3 weeks. She blames her discomfort on "allergy". I suggested  head congestion, possibly some migraine, dry heat and maybe a viral infection could explain her symptoms better. She has had intermittent epistaxis from the left nostril. Aware of reflux mostly controlled with Nexium. Allergy profile 03/04/2011-IgE 13.5, no elevation of specific antibody levels on this panel. ESR-normal.  07/24/11-  30 yoF former smoker followed for allergic rhinitis, asthma, food  intolerance, multiple medication intolerance. Blames "allergy" for recurrent episodes of bronchitis through the winter. Asks if proximity to her wood kitchen cabinets could cause this. Told her no, and educated allergic vs viral. PFT 04/18/11- reviewed- Normal spirometry flows and lung volumes. Reduced DLCO 67 Allergy skin testing- Positive intradermal reactions to weed and tree pollens, dust mite, mold.  10/15/11-   62 yoF former smoker followed for allergic rhinitis, asthma, food intolerance, multiple medication intolerance. Feels like allergy vaccine is working well. Continues building vaccine now at 1:500. Spring and fall are usually her worst seasons and she has done well this spring.  01/16/12- 7 yoF former smoker followed for allergic rhinitis, asthma, food intolerance, multiple medication intolerance. Recent flare up of allergies-change in weather; still on vaccine and feels its working well for her. Blames rain for wheezy bronchitis and chest congestion. Says she is "really pleased" with her allergy vaccine now at 1:50 [name]. If she can control rhinitis she thinks she can prevent flareups of her asthmatic bronchitis. Wants to wait on flu vaccine.  08/07/12- 58 yoF former smoker followed for allergic rhinitis, asthma, food intolerance, multiple medication intolerance. FOLLOWS FOR: doing so much better on vaccine-"feels like I have my life back again" Continues allergy vaccine 1:50 [place] with no problems. She remains outspoken about the improvement she credits it with giving her. Occasional sneeze. Zyrtec at bedtime. No asthma at all.  ROS-see HPI Constitutional:   No-   weight loss, night sweats, fevers, chills, fatigue, lassitude. HEENT:   No- headaches, difficulty swallowing, tooth/dental problems, sore throat,       +Minimal sneezing, no-itching, ear ache, nasal congestion, post nasal drip,  CV:  No-  chest pain, orthopnea, PND, swelling in lower extremities, anasarca, dizziness,  palpitations Resp: No-   shortness of breath with exertion or at rest.              No-   productive cough,  non-productive cough,  No- coughing up of blood.              No-   change in color of mucus.  +minimal recent wheezing.   Skin: No-   rash or lesions. GI:  No-   heartburn, indigestion, abdominal pain, nausea, vomiting,  GU: MS:  No-   joint pain or swelling.   Neuro-     nothing unusual Psych:  No- change in mood or affect. No depression or anxiety.  No memory loss.  OBJ General- Alert, Oriented, Affect-appropriate, Distress- none acute. Pleasant and talkative, over weight Skin- rash-none, lesions- none, excoriation- none Lymphadenopathy- none Head- atraumatic            Eyes- Gross vision intact, PERRLA, conjunctivae clear secretions            Ears- Hearing, canals-normal            Nose- no-mucus bridging, no-Septal dev, polyps, erosion, perforation             Throat- Mallampati II , mucosa red , drainage- none, tonsils- atrophic Neck- flexible , trachea midline, no stridor , thyroid nl, carotid no bruit Chest - symmetrical excursion , unlabored           Heart/CV- RRR , no murmur , no gallop  , no rub, nl s1 s2                           - JVD- none , edema- none, stasis changes- none, varices- none           Lung- +trace inspiratory wheeze, cough-none, dullness-none, rub- none           Chest wall-  Abd- Br/ Gen/ Rectal- Not done, not indicated Extrem- cyanosis- none, clubbing, none, atrophy- none, strength- nl Neuro- grossly intact to observation

## 2012-08-07 NOTE — Patient Instructions (Addendum)
We can continue allergy vaccine 1:50 [employer]  Ok to continue Zyrtec  Please call as needed

## 2012-08-10 ENCOUNTER — Ambulatory Visit: Payer: Medicare Other

## 2012-08-13 ENCOUNTER — Ambulatory Visit (INDEPENDENT_AMBULATORY_CARE_PROVIDER_SITE_OTHER): Payer: Medicare Other

## 2012-08-13 DIAGNOSIS — J309 Allergic rhinitis, unspecified: Secondary | ICD-10-CM | POA: Diagnosis not present

## 2012-08-15 NOTE — Assessment & Plan Note (Signed)
Well controlled now. She has no concerns.

## 2012-08-15 NOTE — Assessment & Plan Note (Signed)
She is satisfied that she is better because of allergy vaccine. We discussed spring pollen season and environmental dust and pollen precautions. Plan-we can move up concentration if needed but she seems good forthese now.

## 2012-08-17 ENCOUNTER — Ambulatory Visit (INDEPENDENT_AMBULATORY_CARE_PROVIDER_SITE_OTHER): Payer: Medicare Other

## 2012-08-17 ENCOUNTER — Encounter: Payer: Self-pay | Admitting: *Deleted

## 2012-08-17 DIAGNOSIS — J309 Allergic rhinitis, unspecified: Secondary | ICD-10-CM

## 2012-08-20 ENCOUNTER — Other Ambulatory Visit: Payer: Self-pay | Admitting: Pulmonary Disease

## 2012-08-20 ENCOUNTER — Other Ambulatory Visit: Payer: Self-pay | Admitting: Internal Medicine

## 2012-08-25 ENCOUNTER — Ambulatory Visit (INDEPENDENT_AMBULATORY_CARE_PROVIDER_SITE_OTHER): Payer: Medicare Other

## 2012-08-25 DIAGNOSIS — J309 Allergic rhinitis, unspecified: Secondary | ICD-10-CM | POA: Diagnosis not present

## 2012-08-31 ENCOUNTER — Ambulatory Visit: Payer: Medicare Other

## 2012-09-01 ENCOUNTER — Ambulatory Visit (INDEPENDENT_AMBULATORY_CARE_PROVIDER_SITE_OTHER): Payer: Medicare Other

## 2012-09-01 DIAGNOSIS — J309 Allergic rhinitis, unspecified: Secondary | ICD-10-CM | POA: Diagnosis not present

## 2012-09-07 ENCOUNTER — Ambulatory Visit (INDEPENDENT_AMBULATORY_CARE_PROVIDER_SITE_OTHER): Payer: Medicare Other

## 2012-09-07 DIAGNOSIS — J309 Allergic rhinitis, unspecified: Secondary | ICD-10-CM

## 2012-09-11 DIAGNOSIS — H26499 Other secondary cataract, unspecified eye: Secondary | ICD-10-CM | POA: Diagnosis not present

## 2012-09-11 DIAGNOSIS — H1045 Other chronic allergic conjunctivitis: Secondary | ICD-10-CM | POA: Diagnosis not present

## 2012-09-11 DIAGNOSIS — D3130 Benign neoplasm of unspecified choroid: Secondary | ICD-10-CM | POA: Diagnosis not present

## 2012-09-14 ENCOUNTER — Ambulatory Visit (INDEPENDENT_AMBULATORY_CARE_PROVIDER_SITE_OTHER): Payer: Medicare Other

## 2012-09-14 DIAGNOSIS — J309 Allergic rhinitis, unspecified: Secondary | ICD-10-CM

## 2012-09-17 ENCOUNTER — Other Ambulatory Visit: Payer: Self-pay | Admitting: Pulmonary Disease

## 2012-09-21 ENCOUNTER — Ambulatory Visit: Payer: Medicare Other

## 2012-09-23 ENCOUNTER — Ambulatory Visit (INDEPENDENT_AMBULATORY_CARE_PROVIDER_SITE_OTHER): Payer: Medicare Other

## 2012-09-23 DIAGNOSIS — J309 Allergic rhinitis, unspecified: Secondary | ICD-10-CM | POA: Diagnosis not present

## 2012-09-28 ENCOUNTER — Ambulatory Visit: Payer: Medicare Other

## 2012-09-28 ENCOUNTER — Telehealth: Payer: Self-pay | Admitting: Pulmonary Disease

## 2012-09-28 MED ORDER — METHYLPREDNISOLONE 4 MG PO KIT: PACK | ORAL | Status: DC

## 2012-09-28 NOTE — Telephone Encounter (Signed)
Per SN--  Cont these  And add mucinex 2 po bid and medrol dosepak  #1  Take as directed

## 2012-09-28 NOTE — Telephone Encounter (Signed)
I spoke with pt and is aware of recs. rx has been sent. Nothing further was needed

## 2012-09-28 NOTE — Telephone Encounter (Signed)
I spoke with pt. She c/o constant clearing throat, head congestion, dry cough, hoarseness, vertigo, HA, and feels terrible x 3 weeks. She has been taking OTC zyrtec, flonase and using saline. Please advise SN thanks Last OV 08/07/12 w/ CDY 07/02/12 w/ SN Pending 01/01/13 w/ SN   Allergies  Allergen Reactions  . Amlodipine Besy-Benazepril Hcl     REACTION: fatique  . Amoxicillin     REACTION: pain in right kidney  . Benicar Hct (Olmesartan Medoxomil-Hctz)     Extreme weakness  . Budesonide-Formoterol Fumarate     Causes tremors and numbness  . Chlorzoxazone (Chlorzoxazone)   . Citalopram Hydrobromide     REACTION: hives  . Citalopram Hydrobromide   . Clonidine Derivatives   . Droperidol     REACTION: hives  . Flexeril (Cyclobenzaprine Hcl)   . Ketorolac Tromethamine     REACTION: hives  . Ketorolac Tromethamine   . Metoclopramide Hcl   . Mometasone Furo-Formoterol Fum     Causes sore throat, blurred vision and unable to sleep--started on med 09-17-10  . Morphine     REACTION: hives and itching  . Olmesartan Medoxomil     REACTION: fatique

## 2012-10-06 ENCOUNTER — Ambulatory Visit (INDEPENDENT_AMBULATORY_CARE_PROVIDER_SITE_OTHER): Payer: Medicare Other

## 2012-10-06 DIAGNOSIS — J309 Allergic rhinitis, unspecified: Secondary | ICD-10-CM

## 2012-10-12 ENCOUNTER — Other Ambulatory Visit: Payer: Self-pay

## 2012-10-12 ENCOUNTER — Ambulatory Visit (INDEPENDENT_AMBULATORY_CARE_PROVIDER_SITE_OTHER): Payer: Medicare Other

## 2012-10-12 DIAGNOSIS — J309 Allergic rhinitis, unspecified: Secondary | ICD-10-CM

## 2012-10-12 DIAGNOSIS — Z1231 Encounter for screening mammogram for malignant neoplasm of breast: Secondary | ICD-10-CM

## 2012-10-19 ENCOUNTER — Ambulatory Visit: Payer: Medicare Other

## 2012-10-20 ENCOUNTER — Other Ambulatory Visit: Payer: Self-pay | Admitting: Pulmonary Disease

## 2012-10-20 ENCOUNTER — Ambulatory Visit (INDEPENDENT_AMBULATORY_CARE_PROVIDER_SITE_OTHER): Payer: Medicare Other

## 2012-10-20 DIAGNOSIS — J309 Allergic rhinitis, unspecified: Secondary | ICD-10-CM

## 2012-10-23 ENCOUNTER — Other Ambulatory Visit: Payer: Self-pay | Admitting: Pulmonary Disease

## 2012-10-26 ENCOUNTER — Ambulatory Visit (INDEPENDENT_AMBULATORY_CARE_PROVIDER_SITE_OTHER): Payer: Medicare Other

## 2012-10-26 DIAGNOSIS — J309 Allergic rhinitis, unspecified: Secondary | ICD-10-CM

## 2012-11-02 ENCOUNTER — Ambulatory Visit (INDEPENDENT_AMBULATORY_CARE_PROVIDER_SITE_OTHER): Payer: Medicare Other

## 2012-11-02 DIAGNOSIS — J309 Allergic rhinitis, unspecified: Secondary | ICD-10-CM | POA: Diagnosis not present

## 2012-11-09 ENCOUNTER — Ambulatory Visit: Payer: Medicare Other

## 2012-11-10 ENCOUNTER — Ambulatory Visit (INDEPENDENT_AMBULATORY_CARE_PROVIDER_SITE_OTHER): Payer: Medicare Other

## 2012-11-10 DIAGNOSIS — J309 Allergic rhinitis, unspecified: Secondary | ICD-10-CM | POA: Diagnosis not present

## 2012-11-16 ENCOUNTER — Ambulatory Visit (INDEPENDENT_AMBULATORY_CARE_PROVIDER_SITE_OTHER): Payer: Medicare Other

## 2012-11-16 DIAGNOSIS — J309 Allergic rhinitis, unspecified: Secondary | ICD-10-CM | POA: Diagnosis not present

## 2012-11-19 ENCOUNTER — Ambulatory Visit
Admission: RE | Admit: 2012-11-19 | Discharge: 2012-11-19 | Disposition: A | Discharge status: Home / Self Care | Payer: Medicare Other | Source: Ambulatory Visit

## 2012-11-19 DIAGNOSIS — Z1231 Encounter for screening mammogram for malignant neoplasm of breast: Secondary | ICD-10-CM

## 2012-11-23 ENCOUNTER — Ambulatory Visit (INDEPENDENT_AMBULATORY_CARE_PROVIDER_SITE_OTHER): Payer: Medicare Other

## 2012-11-23 DIAGNOSIS — J309 Allergic rhinitis, unspecified: Secondary | ICD-10-CM

## 2012-11-24 ENCOUNTER — Ambulatory Visit (INDEPENDENT_AMBULATORY_CARE_PROVIDER_SITE_OTHER): Payer: Medicare Other

## 2012-11-24 DIAGNOSIS — J309 Allergic rhinitis, unspecified: Secondary | ICD-10-CM

## 2012-11-30 ENCOUNTER — Ambulatory Visit (INDEPENDENT_AMBULATORY_CARE_PROVIDER_SITE_OTHER): Payer: Medicare Other

## 2012-11-30 DIAGNOSIS — J309 Allergic rhinitis, unspecified: Secondary | ICD-10-CM | POA: Diagnosis not present

## 2012-12-07 ENCOUNTER — Ambulatory Visit (INDEPENDENT_AMBULATORY_CARE_PROVIDER_SITE_OTHER): Payer: Medicare Other

## 2012-12-07 DIAGNOSIS — J309 Allergic rhinitis, unspecified: Secondary | ICD-10-CM

## 2012-12-11 ENCOUNTER — Other Ambulatory Visit: Payer: Self-pay | Admitting: Dermatology

## 2012-12-11 DIAGNOSIS — L821 Other seborrheic keratosis: Secondary | ICD-10-CM | POA: Diagnosis not present

## 2012-12-11 DIAGNOSIS — Z85828 Personal history of other malignant neoplasm of skin: Secondary | ICD-10-CM | POA: Diagnosis not present

## 2012-12-11 DIAGNOSIS — D0439 Carcinoma in situ of skin of other parts of face: Secondary | ICD-10-CM | POA: Diagnosis not present

## 2012-12-11 DIAGNOSIS — D0430 Carcinoma in situ of skin of unspecified part of face: Secondary | ICD-10-CM | POA: Diagnosis not present

## 2012-12-11 DIAGNOSIS — C44319 Basal cell carcinoma of skin of other parts of face: Secondary | ICD-10-CM | POA: Diagnosis not present

## 2012-12-11 DIAGNOSIS — L570 Actinic keratosis: Secondary | ICD-10-CM | POA: Diagnosis not present

## 2012-12-11 DIAGNOSIS — D485 Neoplasm of uncertain behavior of skin: Secondary | ICD-10-CM | POA: Diagnosis not present

## 2012-12-14 ENCOUNTER — Ambulatory Visit (INDEPENDENT_AMBULATORY_CARE_PROVIDER_SITE_OTHER): Payer: Medicare Other

## 2012-12-14 DIAGNOSIS — J309 Allergic rhinitis, unspecified: Secondary | ICD-10-CM

## 2012-12-21 ENCOUNTER — Ambulatory Visit (INDEPENDENT_AMBULATORY_CARE_PROVIDER_SITE_OTHER): Payer: Medicare Other

## 2012-12-21 DIAGNOSIS — J309 Allergic rhinitis, unspecified: Secondary | ICD-10-CM | POA: Diagnosis not present

## 2012-12-25 ENCOUNTER — Telehealth: Payer: Self-pay | Admitting: Pulmonary Disease

## 2012-12-25 MED ORDER — METHYLPREDNISOLONE 4 MG PO KIT: PACK | ORAL | Status: DC

## 2012-12-25 MED ORDER — FAMCICLOVIR 500 MG PO TABS: 500.0000 mg | ORAL_TABLET | Freq: Three times a day (TID) | ORAL | Status: DC

## 2012-12-25 NOTE — Telephone Encounter (Signed)
Per SN:  Should have come in to see TP to check this rash.  Rx for shingles: Famvir 500 mg # 21 1 po tid and medrol dosepak.  -------  Called, spoke with pt.  Informed her of above per SN.  She verbalized understanding of this, is aware rxs sent to [facility], and is to call back if symptoms do not improve or worsen prior to scheduled OV with SN on Friday, Jan 01, 2013.  ** Note:  Pt wasn't offered OV given time of day call was returned to pt.  There were no openings with TP or MW given time of day call was returned from triage and SN not seeing pt's in office.

## 2012-12-25 NOTE — Telephone Encounter (Signed)
Called, spoke with pt -  Reports there is an area on the right side of her abd that is running along the front.  Area is "fire red," raw, chafting, and burning.  Reports this started x 1 wk and is spreading.  Does see any blisters but there may be 1 - can't tell.  She has tried body lotions.  IS requesting rx to see if this helps given the weekend.    Last OV with SN: 07/02/12 Pending OV with SN 01/01/13  [facility]  ** Please call pt back at [phone] **  Allergies verified: Allergies  Allergen Reactions  . Amlodipine Besy-Benazepril Hcl     REACTION: fatique  . Amoxicillin     REACTION: pain in right kidney  . Benicar Hct [Olmesartan Medoxomil-Hctz]     Extreme weakness  . Budesonide-Formoterol Fumarate     Causes tremors and numbness  . Chlorzoxazone [Chlorzoxazone]   . Citalopram Hydrobromide     REACTION: hives  . Citalopram Hydrobromide   . Clonidine Derivatives   . Droperidol     REACTION: hives  . Flexeril [Cyclobenzaprine Hcl]   . Ketorolac Tromethamine     REACTION: hives  . Ketorolac Tromethamine   . Metoclopramide Hcl   . Mometasone Furo-Formoterol Fum     Causes sore throat, blurred vision and unable to sleep--started on med 09-17-10  . Morphine     REACTION: hives and itching  . Olmesartan Medoxomil     REACTION: fatique

## 2012-12-28 ENCOUNTER — Ambulatory Visit: Payer: Medicare Other

## 2013-01-01 ENCOUNTER — Ambulatory Visit (INDEPENDENT_AMBULATORY_CARE_PROVIDER_SITE_OTHER): Payer: Medicare Other | Admitting: Pulmonary Disease

## 2013-01-01 ENCOUNTER — Other Ambulatory Visit (INDEPENDENT_AMBULATORY_CARE_PROVIDER_SITE_OTHER): Payer: Medicare Other

## 2013-01-01 ENCOUNTER — Encounter: Payer: Self-pay | Admitting: Pulmonary Disease

## 2013-01-01 VITALS — BP 140/80 | HR 92 | Temp 98.1°F | Ht 64.0 in | Wt 161.8 lb

## 2013-01-01 DIAGNOSIS — K5730 Diverticulosis of large intestine without perforation or abscess without bleeding: Secondary | ICD-10-CM

## 2013-01-01 DIAGNOSIS — J45909 Unspecified asthma, uncomplicated: Secondary | ICD-10-CM | POA: Diagnosis not present

## 2013-01-01 DIAGNOSIS — IMO0001 Reserved for inherently not codable concepts without codable children: Secondary | ICD-10-CM

## 2013-01-01 DIAGNOSIS — F411 Generalized anxiety disorder: Secondary | ICD-10-CM

## 2013-01-01 DIAGNOSIS — E559 Vitamin D deficiency, unspecified: Secondary | ICD-10-CM

## 2013-01-01 DIAGNOSIS — N200 Calculus of kidney: Secondary | ICD-10-CM

## 2013-01-01 DIAGNOSIS — E785 Hyperlipidemia, unspecified: Secondary | ICD-10-CM | POA: Diagnosis not present

## 2013-01-01 DIAGNOSIS — I10 Essential (primary) hypertension: Secondary | ICD-10-CM

## 2013-01-01 DIAGNOSIS — K589 Irritable bowel syndrome without diarrhea: Secondary | ICD-10-CM

## 2013-01-01 DIAGNOSIS — K219 Gastro-esophageal reflux disease without esophagitis: Secondary | ICD-10-CM

## 2013-01-01 DIAGNOSIS — J309 Allergic rhinitis, unspecified: Secondary | ICD-10-CM | POA: Diagnosis not present

## 2013-01-01 DIAGNOSIS — M1990 Unspecified osteoarthritis, unspecified site: Secondary | ICD-10-CM

## 2013-01-01 DIAGNOSIS — D126 Benign neoplasm of colon, unspecified: Secondary | ICD-10-CM

## 2013-01-01 LAB — LIPID PANEL
HDL: 48.5 mg/dL (ref >39.00)
LDL Cholesterol: 110 mg/dL — ABNORMAL HIGH (ref 0–99)
Total CHOL/HDL Ratio: 4
Triglycerides: 120 mg/dL (ref 0.0–149.0)
VLDL: 24 mg/dL (ref 0.0–40.0)

## 2013-01-01 NOTE — Progress Notes (Signed)
Subjective:    Patient ID: [name], female    DOB: [birth year], 72 y.o.   MRN: [id number]  HPI 72 y/o WF here for a follow up visit... he has multiple medical problems as noted below...     ~  April 25, 2011:  41moROV & she is c/o head & chest congestion w/ cough & sm amt clear sputum; she thinks very sens to odors & she has been eval by DrYoung for allergy testing: so far IgE & RAST tests are neg & she is awaiting skin tests;  PFT 12/12 showed FVC=2.68 (96%), FEV1=2.18 (111%), mid-flows=108%pred; she also had Lung volumes= wnl and DLCO= wnl...  She is also followed in the [facility] on combination of diet, exercise, & Fish Oil w/ some improvement noted...  ~  Oct 08, 2011:  571moOV & [name] has had a good interval- allergy symptoms [facility] no major difficulty this spring;  She [facility] diet, lifestyle mod, etc> they have rec medication for her Chol but she was reluctant- we reviewed & she has agreed to trial CRESTOR 57m47m.  She also admits to mod anxiety, stress, etc & would like to try something to help but didn't like alpraz in past so we discussed trial of KLONOPIN 0.57mg57md regularly... Finally we note that her BP is sl elev but she stopped prev HCT 12.57mg 33mch was used for dizziness (Meniere's) and her BP> rec that she restart the HCTZ 12.57mg w40m20 daily... We reviewed prob list, meds, xrays and labs> see below>>  ~  December 31, 2011:  49mo RO22moadd-on appt at pt request for URI> she is c/o nasal congestion, drainage, sneezing, & yellow mucus; she thinks "it's the dampness" but notes that allergy shots are really helping> she had IgE level 13-14, totally neg RAST tests but pos scratch tests to trees, weeds, dust mites, & mold;  She has mult food & medication intolerances... We decided to check CXR (clear); and treat w/ Depo/ Pred/ ZPak/ Mucinex/ Fluids/ Delsym...    She is also c/o urinary symptoms & wants a Urinalysis  (clear, C&S- neg).    We reviewed prob list, meds, xrays and labs> see below >>   ~  July 02, 2012:  49mo ROV11moondra has done well w/ her CC being early CTS in the AMs- we discussed the etiology & offered further eval w/ NCVs vs Rx w/ wrist splints Qhs & she will try the latter... We reviewed the following medical problems during today's office visit >>     AR, Asthma> on Zyrtek, Flonase, Advair250, ProairHFA; off prev Pred- breathing is at baseline w/o cough, sput, hemoptysis, SOB, edema, etc; she is working to avoid exac...    HBP> on Amlod5, Hct12.5, K20;  BP= 140/70 & she denies CP, palpit, dizzy, SOB, edema; rec to incr exercise program...    Lipids> on Fish Oil, she is INTOL to all statins etc;  FLP 2/14 shows TChol 191, TG 275, HDL 38, LDL 103 & we discussed a better low fat diet...    GI- GERD, Divertics, IBS, Polyps> on Nexium40; she denies abd pain, n/v, c/d, blood seen; last colon was 3/10 by DrKaplan w/ adenomatous polyp removed; f/u 86yrs.   50yrKidney stones> she passed a left renal stone in 2010 & saw DrOttelin; no recurrence...    DJD, FM, VitD defic> on Tramadol50, OTC meds, MVI &  VitD1000; she has seen DrOlin & told her knee is bone-on-bone but she doesn't want surg...    Anxiety> not currently on anxiolytic rx... We reviewed prob list, meds, xrays and labs> see below for updates >> she had the 2013 Flu vaccine 10/13... LABS 2/14:  FLP- chol looks ok but TG=275 & rec low fat diet;  Chems- wnl;  CBC- wnl;  TSH=1.00;  VitD=49...  ~  January 01, 2013:  44moROV & [name] REALLY motivated and lost 32# over the last 646mogreat job on diet & exercise!!! She is feeling better, less FM pain, less arthritic complaints, and feeling much better;  She had right groin rash & L1 distrib shingles eruption last week- we called in Famvir, Medrol, & she is much improved...  We reviewed the following medical problems during today's office visit >>     AR, Asthma> on Zyrtek, Flonase, allergy shots,  Advair250, ProairHFA; breathing is at baseline w/o cough, sput, hemoptysis, SOB, edema, etc...     HBP> on Amlod5, Hct12.5, K20;  BP= 140/80 & she denies CP, palpit, dizzy, SOB, edema; improving w/ diet, exercise, & wt loss...    Lipids> on Fish Oil & diet, she is INTOL to all statins etc;  FLP 8/14 after wt loss shows TChol 182, TG 120, HDL 49, LDL 110     GI- GERD, Divertics, IBS, Polyps> on Nexium40; she denies abd pain, n/v, c/d, blood seen; last colon was 3/10 by DrKaplan w/ adenomatous polyp removed; f/u 5y59yr   Hx Kidney stones> she passed a left renal stone in 2010 & saw DrOttelin; no recurrence...    DJD, FM, VitD defic> on Tramadol50, OTC meds, MVI & VitD1000; she has seen DrOlin & told her knee is bone-on-bone but she doesn't want surg; she got on diet 2014, lost weight, incr exercise & knees are much better!    Anxiety> not currently on anxiolytic rx... We reviewed prob list, meds, xrays and labs> see below for updates >>  LABS 8/14:  FLP- improved & at goals x LDL=110...           Problem List:    ALLERGIC RHINITIS (ICD-477.9) - she uses OTC Claritin, Saline and FLONASE... she really likes the "netti pot"... ~  2012: Allergy eval by DrYoung w/ neg IgE & RAST and normal PFTs; skin testing was pos for trees, weeds, dust mites, molds; she was started on vaccine & states she's had an easy spring & feels the shots are helping. ~  8/12:  She was treated by TP for acute sinusitis & had Sinus XRays that were neg... ~  8/13:  Presents w/ URI, sinusitis> treated w/ Depo/ Pred/ ZPak etc... ~  8/14:  She reports MUCH IMPROVED on meds and allergy shots...  ASTHMA (ICD-493.90) - stable on ADVAIR 250Bid... Intol Dulera w/ sore throat & insomnia. ~  CXR 3/11 showed hypoaeration, basilar atx, NAD. ~  CXR 10/11 showed clear, DJD spine, NAD. ~  PFTs 12/12 showed FVC=2.68 (96%), FEV1=2.18 (111%), mid-flows=108%pred; she also had Lung volumes= wnl and DLCO= wnl... ~  12/12: presents w/ AB exac &  treated w/ Depo/ Dosepak, Mucinex, Fluids, Hycodan... ~  CXR 8/13 showed normal heart size, clear lungs, NAD... ~  2/14: on Zyrtek, Flonase, Advair250, ProairHFA; off prev Pred- breathing is at baseline w/o cough, sput, hemoptysis, SOB, edema, etc; she is working to avoid exac ~  8/14: she has lost substantial weight on diet & exercise, feeling better, breathing better, continues on  Advair250Bid & prn Proair...  HYPERTENSION (ICD-401.9) - controlled on NORVASC 58m/d, HCTZ 12.545md, KCl 2056mdaily... ~  12/12:  BP= 132/780 & BP's even better at home; denies HA, visual changes, CP, palipit, syncope, dyspnea, edema, etc... ~  5/13:  BP= 160/72 off of the diuretic which she was taking primarily for dizziness (?Meniere's); I explained how this is mainly used as BP med and she will re-start Rx. ~  8/13:  BP= 130/76 & she denies CP, palpit, SOB, edema... ~  2/14: on Amlod5, Hct12.5, K20;  BP= 140/70 & she denies CP, palpit, dizzy, SOB, edema; rec to incr exercise program.  ~  8/14: on Amlod5, Hct12.5, K20;  BP= 140/80 & she denies CP, palpit, dizzy, SOB, edema; improving w/ diet, exercise, & wt loss  HYPERCHOLESTEROLEMIA, BORDERLINE (ICD-272.4) - on diet + exercise, doesn't want meds. ~  FLPButte08 showed TChol 220, TG 279, HDL 43, LDL 122 ~  FLP 6/10 showed TChol 204, TG 167, HDL 43, LDL 123... needs better diet, get wt down. ~  FLP 6/11 showed TChol 221, TG 385, HDL 40, LDL 116... must get wt down! discussed low chol, low fat. ~  FLP 6/12 showed TChol 231, TG 187, HDL 44, LDL 149... Looks worse despite diet efforts, [facility] been working w/ her on a combination of Diet, Exercise, Fish Oil... ~  FLPLong Barn/12 on lifestyle mod showed TChol 206, TG 311, HDL 45, LDL 121... Needs better low fat diet. ~  FLP 2/13 on lifestyle mod showed TChol 242, TG 157, HDL 45, LDL 165... We decided to start CREL[place]  FLPClarke14 on diet alone showed TChol 191, TG 275, HDL 38, LDL 103; she is  INTOL to all Chol meds... ~  FLP 8/14 on diet alone after 32# wt loss showed TChol 182, TG 120, HDL 49, LDL 110  GERD (ICD-530.81) - prev on Protonix but insurance won't pay, therefore switch to NEXChaunceym11m.. ?Omep caused diarrhea. ~  last EGD 9/06 showed esophagitis...  Hx Gallstones -  s/p ERCP w/ sphincterotomy 12/04... and subsequent cholecystectomy...  DIVERTICULOSIS OF COLON (ICD-562.10) IBS (ICD-564.1) COLONIC POLYPS (ICD-211.3) ~  colonoscopy 12/00 showed divertics, hems, colon polyp- adenomatous...  ~  f/u colonoscopy 3/10 by DrKaplan w/ polyp removed- adenomatous, f/u planned 5 yrs.  Hx of NEPHROLITHIASIS (ICD-592.0) - went to ER 4/10 w/ left flank pain & CT showed mild hydroneph & kidney stone... passed it on her own & followed by Urology- DrOttelin... seen 11/11 w/ right flank pain & neg eval (exam w/ tender ribs on palp),  OSTEOARTHRITIS (ICD-715.90) - she has some left hip & R>L knee discomfort...  uses Tylenol for pain... she notes prev right knee arthroscopy in 1999 by DrRendall... Rx w/ VICODIN Prn. ~  6/11:  we discussed ortho referral & will set up refer to DrOlin per request. ~  6/12:  She notes LBP w/ prev XRays showing advanced degen arthritis but she declines referral to Ortho. ~  2/14: on Tramadol50, OTC meds, MVI & VitD1000; she has seen DrOlin & told her knee is bone-on-bone but she doesn't want surg. ~  8/14: she reports that her arthritic complaints and FM are improved after her wt loss...  FIBROMYALGIA (ICD-729.1) - takes Calcium, MVI, Vit D, & now TYLENOL Prn... hx mult somatic complaints.  VITAMIN D DEFICIENCY (ICD-268.9) ~  labs 6/10 showed Vit D level= 21... rec> Vit D 1000 u daily. ~  labs 3/11 showed  Vit D level= 35... continue same. ~  Labs 2/14 showed Vit D level = 49  POSTCONCUSSION SYNDROME (ICD-310.2) - from [facility] 12/08, fully recovered & back to baseline. CERVICAL STRAIN, ACUTE (ICD-847.0) - "I do exercises and have a good pillow"... DIZZINESS  (ICD-780.4) - uses MECLIZINE 46m Prn...  ANXIETY (ICD-300.00) - INTOL Ambien and Lorazepam w/ nightmares... ~  5/13:  She is under a lot of stress she says & has agreed to try medication to help; rec> KLONOPIN 0.532mbid... ~  2/14: she does not list any anxiolytic med at this time...  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   Past Surgical History  Procedure Laterality Date  . Cholecystectomy, laparoscopic  2004    for gallstone pancreatitis  . Thyroid surgery      Outpatient Encounter Prescriptions as of 01/01/2013  Medication Sig Dispense Refill  . acetaminophen (TYLENOL) 500 MG tablet Take 500 mg by mouth every 6 (six) hours as needed.        . Marland KitchenDVAIR DISKUS 250-50 MCG/DOSE AEPB INHALE 1 DOSE BY MOUTH TWICE DAILY. RINSE MOUTH AFTER USE  60 each  9  . albuterol (PROVENTIL HFA;VENTOLIN HFA) 108 (90 BASE) MCG/ACT inhaler Inhale 2 puffs into the lungs every 4 (four) hours as needed for wheezing or shortness of breath. RESCUE  1 Inhaler  6  . amLODipine (NORVASC) 5 MG tablet TAKE 1 TABLET BY MOUTH EVERY DAY  30 tablet  11  . aspirin 81 MG EC tablet Take 81 mg by mouth daily.        . Cetirizine HCl (ZYRTEC ALLERGY) 10 MG CAPS Take 1 capsule by mouth daily.        . Cholecalciferol (VITAMIN D3) 1000 UNITS CAPS Take by mouth daily.        . famciclovir (FAMVIR) 500 MG tablet Take 1 tablet (500 mg total) by mouth 3 (three) times daily.  21 tablet  0  . fluticasone (FLONASE) 50 MCG/ACT nasal spray USE 2 SPRAYS IN EACH NOSTRIL TWICE A DAY  16 g  6  . hydrochlorothiazide (MICROZIDE) 12.5 MG capsule TAKE 1 TABLET (12.5 MG TOTAL) BY MOUTH DAILY.  30 capsule  6  . meclizine (ANTIVERT) 25 MG tablet TAKE 1 TABLET 3 TIMES A DAY AS NEEDED FOR DIZZINESS/NAUSEA  30 tablet  1  . Multiple Vitamins-Minerals (CENTRUM SILVER PO) Take by mouth daily.        . Marland KitchenEXIUM 40 MG capsule TAKE 1 CAPSULE BY MOUTH ONCE A DAY 30  MINUTES BEFORE THE 1ST MEAL OF THE DAY  30 capsule  6  . Omega-3 Fatty Acids (FISH OIL) 1000 MG CAPS Take 1 capsule by mouth 2 (two) times daily.       . potassium chloride SA (K-DUR,KLOR-CON) 20 MEQ tablet TAKE 1 TABLET BY MOUTH DAILY  30 tablet  11  . Propylene Glycol (SYSTANE BALANCE) 0.6 % SOLN Place 2 drops into both eyes 2 (two) times daily.        . Sodium Chloride-Sodium Bicarb (SINUS WANecheETI POT NA) by Nasal route as needed.        . traMADol (ULTRAM) 50 MG tablet Take 1-2 every 4 hours if still coughing on Delsym. As needed       . [DISCONTINUED] meclizine (ANTIVERT) 25 MG tablet Take 25 mg by mouth 3 (three) times daily as needed for dizziness.      . [DISCONTINUED] methylPREDNISolone (MEDROL DOSEPAK) 4 MG  tablet follow package directions  21 tablet  0  . [DISCONTINUED] methylPREDNISolone (MEDROL, PAK,) 4 MG tablet follow package directions  21 tablet  0   No facility-administered encounter medications on file as of 01/01/2013.    Allergies  Allergen Reactions  . Amlodipine Besy-Benazepril Hcl     REACTION: fatique  . Amoxicillin     REACTION: pain in right kidney  . Benicar Hct [Olmesartan Medoxomil-Hctz]     Extreme weakness  . Budesonide-Formoterol Fumarate     Causes tremors and numbness  . Chlorzoxazone [Chlorzoxazone]   . Citalopram Hydrobromide     REACTION: hives  . Citalopram Hydrobromide   . Clonidine Derivatives   . Droperidol     REACTION: hives  . Flexeril [Cyclobenzaprine Hcl]   . Ketorolac Tromethamine     REACTION: hives  . Ketorolac Tromethamine   . Metoclopramide Hcl   . Mometasone Furo-Formoterol Fum     Causes sore throat, blurred vision and unable to sleep--started on med 09-17-10  . Morphine     REACTION: hives and itching  . Olmesartan Medoxomil     REACTION: fatique    Current Medications, Allergies, Past Medical History, Past Surgical History, Family History, and Social History were reviewed in [employer]  record.   Review of Systems        See HPI - all other systems neg except as noted... The patient complains of decreased hearing, dyspnea on exertion, headaches, muscle weakness, and difficulty walking.  The patient denies anorexia, fever, weight loss, weight gain, vision loss, hoarseness, chest pain, syncope, peripheral edema, prolonged cough, hemoptysis, abdominal pain, melena, hematochezia, severe indigestion/heartburn, hematuria, incontinence, suspicious skin lesions, transient blindness, depression, unusual weight change, abnormal bleeding, enlarged lymph nodes, and angioedema.     Objective:   Physical Exam     WD, WN, 72 y/o WF in NAD... Great job w/ wt reduction. GENERAL:  Alert & oriented; pleasant & cooperative... HEENT:  [place]/AT, EOM-wnl, PERRLA, EACs-clear, TMs-wnl, NOSE: sl pale, no exud; THROAT-clear & wnl. NECK:  Supple w/ fair ROM; no JVD; normal carotid impulses w/o bruits; no thyromegaly or nodules palpated; no lymphadenopathy. CHEST:  scat rhonchi & otherw clear, no rales or signs of consolidation... HEART:  Regular Rhythm; without murmurs/ rubs/ or gallops detected... ABDOMEN:  Soft & nontender; normal bowel sounds; no organomegaly or masses palpated... EXT: without deformities, mild arthritic changes; no varicose veins/ venous insuffic/ or edema. BACK:  she has numerous trigger point and tender spots thru the shoulders, back, chest etc... NEURO:  CN's intact; no focal neuro deficits... DERM:  No lesions noted; no rash etc...  RADIOLOGY DATA:  Reviewed in the EPIC EMR & discussed w/ the patient...  LABORATORY DATA:  Reviewed in the EPIC EMR & discussed w/ the patient...   Assessment & Plan:    AR/AB>  Allergy eval by DrYoung w/ neg IgE & RAST and normal PFTs; skin testing was pos for trees, weeds, dust mites, molds; improved on vaccine & states she's had an easy yr so far & feels the shots are helping...  HBP>  BP is controlled on Norvasc & HCT; continue same +  diet/ exercise/ wt reduction...  CHOL>  She is INTOL to all statins and on diet + FishOil alone; we reviewed low fat diet & exerc program=> great job!  GERD>  She notes that the Nexium really works for her...  DJD/ Back Pain/ FM>  On OTC meds as needed but she is much improved w/  her wt loss...  Anxiety>  She was intol to Lorazepam in the past, tried Klonopin, but doesn't stick w/ any med...  Mult medication sensitivities>  She has an impressive list of med reactions> currently numbering 7...   Patient's Medications  New Prescriptions   No medications on file  Previous Medications   ACETAMINOPHEN (TYLENOL) 500 MG TABLET    Take 500 mg by mouth every 6 (six) hours as needed.     ADVAIR DISKUS 250-50 MCG/DOSE AEPB    INHALE 1 DOSE BY MOUTH TWICE DAILY. RINSE MOUTH AFTER USE   ALBUTEROL (PROVENTIL HFA;VENTOLIN HFA) 108 (90 BASE) MCG/ACT INHALER    Inhale 2 puffs into the lungs every 4 (four) hours as needed for wheezing or shortness of breath. RESCUE   AMLODIPINE (NORVASC) 5 MG TABLET    TAKE 1 TABLET BY MOUTH EVERY DAY   ASPIRIN 81 MG EC TABLET    Take 81 mg by mouth daily.     CETIRIZINE HCL (ZYRTEC ALLERGY) 10 MG CAPS    Take 1 capsule by mouth daily.     CHOLECALCIFEROL (VITAMIN D3) 1000 UNITS CAPS    Take by mouth daily.     FAMCICLOVIR (FAMVIR) 500 MG TABLET    Take 1 tablet (500 mg total) by mouth 3 (three) times daily.   FLUTICASONE (FLONASE) 50 MCG/ACT NASAL SPRAY    USE 2 SPRAYS IN EACH NOSTRIL TWICE A DAY   HYDROCHLOROTHIAZIDE (MICROZIDE) 12.5 MG CAPSULE    TAKE 1 TABLET (12.5 MG TOTAL) BY MOUTH DAILY.   MECLIZINE (ANTIVERT) 25 MG TABLET    TAKE 1 TABLET 3 TIMES A DAY AS NEEDED FOR DIZZINESS/NAUSEA   MULTIPLE VITAMINS-MINERALS (CENTRUM SILVER PO)    Take by mouth daily.     NEXIUM 40 MG CAPSULE    TAKE 1 CAPSULE BY MOUTH ONCE A DAY 30 MINUTES BEFORE THE 1ST MEAL OF THE DAY   OMEGA-3 FATTY ACIDS (FISH OIL) 1000 MG CAPS    Take 1 capsule by mouth 2 (two) times daily.     POTASSIUM CHLORIDE SA (K-DUR,KLOR-CON) 20 MEQ TABLET    TAKE 1 TABLET BY MOUTH DAILY   PROPYLENE GLYCOL (SYSTANE BALANCE) 0.6 % SOLN    Place 2 drops into both eyes 2 (two) times daily.     SODIUM CHLORIDE-SODIUM BICARB (SINUS [place] NETI POT NA)    by Nasal route as needed.     TRAMADOL (ULTRAM) 50 MG TABLET    Take 1-2 every 4 hours if still coughing on Delsym. As needed   Modified Medications   No medications on file  Discontinued Medications   MECLIZINE (ANTIVERT) 25 MG TABLET    Take 25 mg by mouth 3 (three) times daily as needed for dizziness.   METHYLPREDNISOLONE (MEDROL DOSEPAK) 4 MG TABLET    follow package directions   METHYLPREDNISOLONE (MEDROL, PAK,) 4 MG TABLET    follow package directions

## 2013-01-01 NOTE — Patient Instructions (Addendum)
Today we updated your med list in our EPIC system...    Continue your current medications the same...   Congrats on your diet/ exercise/ & weight loss!!!  Today we did your follow up Lipid profile...    We will contact you w/ the results when available...   Keep up the good work...  Let's plan a follow up visit in 80mo sooner if needed for problems..Marland KitchenMarland Kitchen

## 2013-01-04 ENCOUNTER — Ambulatory Visit (INDEPENDENT_AMBULATORY_CARE_PROVIDER_SITE_OTHER): Payer: Medicare Other

## 2013-01-04 DIAGNOSIS — J309 Allergic rhinitis, unspecified: Secondary | ICD-10-CM | POA: Diagnosis not present

## 2013-01-05 DIAGNOSIS — C44319 Basal cell carcinoma of skin of other parts of face: Secondary | ICD-10-CM | POA: Diagnosis not present

## 2013-01-05 DIAGNOSIS — Z85828 Personal history of other malignant neoplasm of skin: Secondary | ICD-10-CM | POA: Diagnosis not present

## 2013-01-05 DIAGNOSIS — C44320 Squamous cell carcinoma of skin of unspecified parts of face: Secondary | ICD-10-CM | POA: Diagnosis not present

## 2013-01-12 ENCOUNTER — Ambulatory Visit (INDEPENDENT_AMBULATORY_CARE_PROVIDER_SITE_OTHER): Payer: Medicare Other

## 2013-01-12 DIAGNOSIS — J309 Allergic rhinitis, unspecified: Secondary | ICD-10-CM | POA: Diagnosis not present

## 2013-01-18 ENCOUNTER — Ambulatory Visit (INDEPENDENT_AMBULATORY_CARE_PROVIDER_SITE_OTHER): Payer: Medicare Other

## 2013-01-18 DIAGNOSIS — J309 Allergic rhinitis, unspecified: Secondary | ICD-10-CM

## 2013-01-25 ENCOUNTER — Ambulatory Visit (INDEPENDENT_AMBULATORY_CARE_PROVIDER_SITE_OTHER): Payer: Medicare Other

## 2013-01-25 DIAGNOSIS — J309 Allergic rhinitis, unspecified: Secondary | ICD-10-CM | POA: Diagnosis not present

## 2013-02-01 ENCOUNTER — Ambulatory Visit (INDEPENDENT_AMBULATORY_CARE_PROVIDER_SITE_OTHER): Payer: Medicare Other

## 2013-02-01 DIAGNOSIS — J309 Allergic rhinitis, unspecified: Secondary | ICD-10-CM | POA: Diagnosis not present

## 2013-02-08 ENCOUNTER — Ambulatory Visit (INDEPENDENT_AMBULATORY_CARE_PROVIDER_SITE_OTHER): Payer: Medicare Other

## 2013-02-08 DIAGNOSIS — J309 Allergic rhinitis, unspecified: Secondary | ICD-10-CM | POA: Diagnosis not present

## 2013-02-15 ENCOUNTER — Ambulatory Visit (INDEPENDENT_AMBULATORY_CARE_PROVIDER_SITE_OTHER): Payer: Medicare Other

## 2013-02-15 DIAGNOSIS — J309 Allergic rhinitis, unspecified: Secondary | ICD-10-CM | POA: Diagnosis not present

## 2013-02-22 ENCOUNTER — Ambulatory Visit (INDEPENDENT_AMBULATORY_CARE_PROVIDER_SITE_OTHER): Payer: Medicare Other

## 2013-02-22 DIAGNOSIS — J309 Allergic rhinitis, unspecified: Secondary | ICD-10-CM

## 2013-03-01 ENCOUNTER — Ambulatory Visit (INDEPENDENT_AMBULATORY_CARE_PROVIDER_SITE_OTHER): Payer: Medicare Other

## 2013-03-01 DIAGNOSIS — J309 Allergic rhinitis, unspecified: Secondary | ICD-10-CM

## 2013-03-08 ENCOUNTER — Ambulatory Visit (INDEPENDENT_AMBULATORY_CARE_PROVIDER_SITE_OTHER): Payer: Medicare Other

## 2013-03-08 DIAGNOSIS — J309 Allergic rhinitis, unspecified: Secondary | ICD-10-CM

## 2013-03-15 ENCOUNTER — Ambulatory Visit (INDEPENDENT_AMBULATORY_CARE_PROVIDER_SITE_OTHER): Payer: Medicare Other

## 2013-03-15 DIAGNOSIS — J309 Allergic rhinitis, unspecified: Secondary | ICD-10-CM | POA: Diagnosis not present

## 2013-03-22 ENCOUNTER — Ambulatory Visit (INDEPENDENT_AMBULATORY_CARE_PROVIDER_SITE_OTHER): Payer: Medicare Other

## 2013-03-22 DIAGNOSIS — J309 Allergic rhinitis, unspecified: Secondary | ICD-10-CM

## 2013-03-29 ENCOUNTER — Ambulatory Visit (INDEPENDENT_AMBULATORY_CARE_PROVIDER_SITE_OTHER): Payer: Medicare Other

## 2013-03-29 DIAGNOSIS — J309 Allergic rhinitis, unspecified: Secondary | ICD-10-CM | POA: Diagnosis not present

## 2013-03-30 ENCOUNTER — Ambulatory Visit (INDEPENDENT_AMBULATORY_CARE_PROVIDER_SITE_OTHER): Payer: Medicare Other

## 2013-03-30 DIAGNOSIS — J309 Allergic rhinitis, unspecified: Secondary | ICD-10-CM

## 2013-04-05 ENCOUNTER — Ambulatory Visit (INDEPENDENT_AMBULATORY_CARE_PROVIDER_SITE_OTHER): Payer: Medicare Other

## 2013-04-05 DIAGNOSIS — J309 Allergic rhinitis, unspecified: Secondary | ICD-10-CM | POA: Diagnosis not present

## 2013-04-12 ENCOUNTER — Ambulatory Visit (INDEPENDENT_AMBULATORY_CARE_PROVIDER_SITE_OTHER): Payer: Medicare Other

## 2013-04-12 DIAGNOSIS — J309 Allergic rhinitis, unspecified: Secondary | ICD-10-CM | POA: Diagnosis not present

## 2013-04-19 ENCOUNTER — Ambulatory Visit (INDEPENDENT_AMBULATORY_CARE_PROVIDER_SITE_OTHER): Payer: Medicare Other

## 2013-04-19 DIAGNOSIS — J309 Allergic rhinitis, unspecified: Secondary | ICD-10-CM | POA: Diagnosis not present

## 2013-04-20 ENCOUNTER — Other Ambulatory Visit: Payer: Self-pay | Admitting: Pulmonary Disease

## 2013-04-26 ENCOUNTER — Ambulatory Visit (INDEPENDENT_AMBULATORY_CARE_PROVIDER_SITE_OTHER): Payer: Medicare Other

## 2013-04-26 DIAGNOSIS — J309 Allergic rhinitis, unspecified: Secondary | ICD-10-CM

## 2013-05-03 ENCOUNTER — Ambulatory Visit (INDEPENDENT_AMBULATORY_CARE_PROVIDER_SITE_OTHER): Payer: Medicare Other

## 2013-05-03 DIAGNOSIS — J309 Allergic rhinitis, unspecified: Secondary | ICD-10-CM | POA: Diagnosis not present

## 2013-05-10 ENCOUNTER — Ambulatory Visit (INDEPENDENT_AMBULATORY_CARE_PROVIDER_SITE_OTHER): Payer: Medicare Other

## 2013-05-10 DIAGNOSIS — J309 Allergic rhinitis, unspecified: Secondary | ICD-10-CM | POA: Diagnosis not present

## 2013-05-14 ENCOUNTER — Encounter: Payer: Self-pay | Admitting: Internal Medicine

## 2013-05-17 ENCOUNTER — Ambulatory Visit (INDEPENDENT_AMBULATORY_CARE_PROVIDER_SITE_OTHER): Payer: Medicare Other

## 2013-05-17 DIAGNOSIS — J309 Allergic rhinitis, unspecified: Secondary | ICD-10-CM

## 2013-05-24 ENCOUNTER — Ambulatory Visit (INDEPENDENT_AMBULATORY_CARE_PROVIDER_SITE_OTHER): Payer: Medicare Other

## 2013-05-24 DIAGNOSIS — J309 Allergic rhinitis, unspecified: Secondary | ICD-10-CM | POA: Diagnosis not present

## 2013-05-25 ENCOUNTER — Other Ambulatory Visit: Payer: Self-pay | Admitting: Pulmonary Disease

## 2013-05-31 ENCOUNTER — Ambulatory Visit (INDEPENDENT_AMBULATORY_CARE_PROVIDER_SITE_OTHER): Payer: Medicare Other

## 2013-05-31 DIAGNOSIS — J309 Allergic rhinitis, unspecified: Secondary | ICD-10-CM | POA: Diagnosis not present

## 2013-06-07 ENCOUNTER — Ambulatory Visit (INDEPENDENT_AMBULATORY_CARE_PROVIDER_SITE_OTHER): Payer: Medicare Other

## 2013-06-07 DIAGNOSIS — J309 Allergic rhinitis, unspecified: Secondary | ICD-10-CM

## 2013-06-10 ENCOUNTER — Encounter: Payer: Self-pay | Admitting: Gastroenterology

## 2013-06-14 ENCOUNTER — Ambulatory Visit (INDEPENDENT_AMBULATORY_CARE_PROVIDER_SITE_OTHER): Payer: Medicare Other

## 2013-06-14 DIAGNOSIS — J309 Allergic rhinitis, unspecified: Secondary | ICD-10-CM

## 2013-06-21 ENCOUNTER — Ambulatory Visit: Payer: Medicare Other

## 2013-06-22 ENCOUNTER — Telehealth: Payer: Self-pay | Admitting: Pulmonary Disease

## 2013-06-22 MED ORDER — AZITHROMYCIN 250 MG PO TABS: ORAL_TABLET | ORAL | Status: AC

## 2013-06-22 NOTE — Telephone Encounter (Signed)
lmomtcb x1 

## 2013-06-22 NOTE — Telephone Encounter (Signed)
Pt is aware of SN recs. Rx has been sent in. 

## 2013-06-22 NOTE — Telephone Encounter (Signed)
Spoke with pt. Reports headache, sore throat, dry cough and sinus congestion x3 days. Denies SOB, chest tightness or fever/body aches. Has tried taking Tylenol and her prescribed medications with minimal relief. Would like to know if SN would send in a Zpack.  Allergies  Allergen Reactions  . Amlodipine Besy-Benazepril Hcl     REACTION: fatique  . Amoxicillin     REACTION: pain in right kidney  . Benicar Hct [Olmesartan Medoxomil-Hctz]     Extreme weakness  . Budesonide-Formoterol Fumarate     Causes tremors and numbness  . Chlorzoxazone [Chlorzoxazone]   . Citalopram Hydrobromide     REACTION: hives  . Citalopram Hydrobromide   . Clonidine Derivatives   . Droperidol     REACTION: hives  . Flexeril [Cyclobenzaprine Hcl]   . Ketorolac Tromethamine     REACTION: hives  . Ketorolac Tromethamine   . Metoclopramide Hcl   . Mometasone Furo-Formoterol Fum     Causes sore throat, blurred vision and unable to sleep--started on med 09-17-10  . Morphine     REACTION: hives and itching  . Olmesartan Medoxomil     REACTION: fatique    SN - please advise. Thanks.

## 2013-06-22 NOTE — Telephone Encounter (Signed)
Per SN---  zpak #1  Take as directed

## 2013-06-23 ENCOUNTER — Telehealth: Payer: Self-pay | Admitting: Internal Medicine

## 2013-06-23 NOTE — Telephone Encounter (Signed)
Atc line busy, wcb. [place] Bing, CMA

## 2013-06-24 NOTE — Telephone Encounter (Signed)
LMTC x 1  

## 2013-06-25 NOTE — Telephone Encounter (Signed)
Called and made pt aware. Nothing further needed

## 2013-06-25 NOTE — Telephone Encounter (Signed)
Per SN---  She should wait on an allergy shot is she is having an allergy flare  Glad she has improved.  She will need to wait on her allergy shot till all is resolved.  thanks

## 2013-06-25 NOTE — Telephone Encounter (Signed)
Spoke with [name] and she Reports headache, sore throat, dry cough and sinus congestion x3 days.  Denies SOB, chest tightness or fever/body aches. [name] was prescribed zpak by SN on 06-23-13. She states she is feeling some better and wants to know if it is ok to have allergy shot. Please advise. [place] Bing, CMA

## 2013-07-05 ENCOUNTER — Ambulatory Visit (INDEPENDENT_AMBULATORY_CARE_PROVIDER_SITE_OTHER): Payer: Medicare Other

## 2013-07-05 DIAGNOSIS — J309 Allergic rhinitis, unspecified: Secondary | ICD-10-CM

## 2013-07-06 ENCOUNTER — Ambulatory Visit: Payer: Medicare Other | Admitting: Pulmonary Disease

## 2013-07-12 ENCOUNTER — Ambulatory Visit (INDEPENDENT_AMBULATORY_CARE_PROVIDER_SITE_OTHER): Payer: Medicare Other

## 2013-07-12 ENCOUNTER — Telehealth: Payer: Self-pay | Admitting: Pulmonary Disease

## 2013-07-12 DIAGNOSIS — J309 Allergic rhinitis, unspecified: Secondary | ICD-10-CM | POA: Diagnosis not present

## 2013-07-12 MED ORDER — FLUTICASONE-SALMETEROL 250-50 MCG/DOSE IN AEPB: 1.0000 | INHALATION_SPRAY | Freq: Two times a day (BID) | RESPIRATORY_TRACT | Status: DC

## 2013-07-12 NOTE — Telephone Encounter (Signed)
Pt requests 90 day supply of Advair be sent to [facility]. Her insurance is now requiring 90 day supply rx's. This has been sent in. Nothing further was needed at this time.

## 2013-07-19 ENCOUNTER — Ambulatory Visit (INDEPENDENT_AMBULATORY_CARE_PROVIDER_SITE_OTHER): Payer: Medicare Other

## 2013-07-19 DIAGNOSIS — J309 Allergic rhinitis, unspecified: Secondary | ICD-10-CM | POA: Diagnosis not present

## 2013-07-20 ENCOUNTER — Other Ambulatory Visit: Payer: Self-pay | Admitting: Pulmonary Disease

## 2013-07-23 ENCOUNTER — Other Ambulatory Visit: Payer: Self-pay | Admitting: Pulmonary Disease

## 2013-07-23 DIAGNOSIS — J309 Allergic rhinitis, unspecified: Secondary | ICD-10-CM

## 2013-07-23 DIAGNOSIS — E559 Vitamin D deficiency, unspecified: Secondary | ICD-10-CM

## 2013-07-23 DIAGNOSIS — I10 Essential (primary) hypertension: Secondary | ICD-10-CM

## 2013-07-26 ENCOUNTER — Ambulatory Visit: Payer: Medicare Other

## 2013-07-27 ENCOUNTER — Telehealth: Payer: Self-pay | Admitting: Pulmonary Disease

## 2013-07-27 ENCOUNTER — Ambulatory Visit (INDEPENDENT_AMBULATORY_CARE_PROVIDER_SITE_OTHER): Payer: Medicare Other

## 2013-07-27 DIAGNOSIS — J309 Allergic rhinitis, unspecified: Secondary | ICD-10-CM

## 2013-07-27 NOTE — Telephone Encounter (Signed)
Pt returned call.  Spoke with patient who is requesting to establish with PCP closer to her home as she has to take a taxi to her visits.  Pt stated that she would like stay within the [facility] system, but it is simply too costly for her transportation.  Performed a Producer, television/film/video for offices in her area and gave her two options: [facility] [address] [place], [place] [address] [phone]  [facility]: Denita Lung MD [address] [place], [place] [address] [phone]  Pt stated she will call these offices to check for availability.  Asked pt to please call our office if she is able to establish with one of these offices and we will gladly place a formal referral and fax her records.  Pt very appreciative and will be in touch.  Nothing further needed at this time; will sign off.

## 2013-07-27 NOTE — Telephone Encounter (Signed)
lmtcb x1 

## 2013-07-29 ENCOUNTER — Telehealth: Payer: Self-pay | Admitting: Gastroenterology

## 2013-07-29 NOTE — Telephone Encounter (Signed)
Pt states she has been having trouble with her stomach and she bought some Pepto Bismol to take for the diarrhea. Pt states she is concerned because her stools have now been black. Discussed with pt that the bismoth in Pepto bismol can make your stools black. Pt verbalized understanding.

## 2013-08-02 ENCOUNTER — Ambulatory Visit (INDEPENDENT_AMBULATORY_CARE_PROVIDER_SITE_OTHER): Payer: Medicare Other

## 2013-08-02 DIAGNOSIS — J309 Allergic rhinitis, unspecified: Secondary | ICD-10-CM | POA: Diagnosis not present

## 2013-08-06 ENCOUNTER — Ambulatory Visit (INDEPENDENT_AMBULATORY_CARE_PROVIDER_SITE_OTHER): Payer: Medicare Other

## 2013-08-06 DIAGNOSIS — J309 Allergic rhinitis, unspecified: Secondary | ICD-10-CM

## 2013-08-09 ENCOUNTER — Ambulatory Visit (INDEPENDENT_AMBULATORY_CARE_PROVIDER_SITE_OTHER): Payer: Medicare Other

## 2013-08-09 DIAGNOSIS — J309 Allergic rhinitis, unspecified: Secondary | ICD-10-CM

## 2013-08-10 ENCOUNTER — Ambulatory Visit (INDEPENDENT_AMBULATORY_CARE_PROVIDER_SITE_OTHER): Payer: Medicare Other

## 2013-08-10 DIAGNOSIS — J309 Allergic rhinitis, unspecified: Secondary | ICD-10-CM | POA: Diagnosis not present

## 2013-08-16 ENCOUNTER — Ambulatory Visit (INDEPENDENT_AMBULATORY_CARE_PROVIDER_SITE_OTHER): Payer: Medicare Other

## 2013-08-16 DIAGNOSIS — J309 Allergic rhinitis, unspecified: Secondary | ICD-10-CM

## 2013-08-23 ENCOUNTER — Ambulatory Visit (INDEPENDENT_AMBULATORY_CARE_PROVIDER_SITE_OTHER): Payer: Medicare Other

## 2013-08-23 DIAGNOSIS — J309 Allergic rhinitis, unspecified: Secondary | ICD-10-CM

## 2013-08-25 ENCOUNTER — Telehealth: Payer: Self-pay | Admitting: Internal Medicine

## 2013-08-25 ENCOUNTER — Encounter: Payer: Self-pay | Admitting: Nurse Practitioner

## 2013-08-25 ENCOUNTER — Ambulatory Visit (INDEPENDENT_AMBULATORY_CARE_PROVIDER_SITE_OTHER): Payer: Medicare Other | Admitting: Nurse Practitioner

## 2013-08-25 VITALS — BP 130/72 | HR 67 | Temp 99.1°F | Resp 10 | Ht 63.0 in | Wt 140.8 lb

## 2013-08-25 DIAGNOSIS — J45909 Unspecified asthma, uncomplicated: Secondary | ICD-10-CM

## 2013-08-25 DIAGNOSIS — IMO0001 Reserved for inherently not codable concepts without codable children: Secondary | ICD-10-CM

## 2013-08-25 DIAGNOSIS — M1990 Unspecified osteoarthritis, unspecified site: Secondary | ICD-10-CM

## 2013-08-25 DIAGNOSIS — I10 Essential (primary) hypertension: Secondary | ICD-10-CM

## 2013-08-25 DIAGNOSIS — E785 Hyperlipidemia, unspecified: Secondary | ICD-10-CM

## 2013-08-25 DIAGNOSIS — K219 Gastro-esophageal reflux disease without esophagitis: Secondary | ICD-10-CM

## 2013-08-25 DIAGNOSIS — F411 Generalized anxiety disorder: Secondary | ICD-10-CM

## 2013-08-25 DIAGNOSIS — E559 Vitamin D deficiency, unspecified: Secondary | ICD-10-CM

## 2013-08-25 MED ORDER — TETANUS-DIPHTH-ACELL PERTUSSIS 5-2.5-18.5 LF-MCG/0.5 IM SUSP: 0.5000 mL | Freq: Once | INTRAMUSCULAR | Status: DC

## 2013-08-25 MED ORDER — PANTOPRAZOLE SODIUM 40 MG PO TBEC: 40.0000 mg | DELAYED_RELEASE_TABLET | Freq: Every day | ORAL | Status: DC

## 2013-08-25 MED ORDER — ZOSTER VACCINE LIVE 19400 UNT/0.65ML ~~LOC~~ SOLR: 0.6500 mL | Freq: Once | SUBCUTANEOUS | Status: DC

## 2013-08-25 NOTE — Telephone Encounter (Signed)
advair is ok, no problems with it they are giving her a 3 mo supply, no need to call.Hillery Hunter

## 2013-08-25 NOTE — Patient Instructions (Signed)
To call insurance to see what pulmonary medication they will cover instead of advair -- you have allergy to symbicort in system   New prescription for Protonix  Will get blood work today and to follow up in 4-6 weeks for Extended visit

## 2013-08-25 NOTE — Progress Notes (Signed)
Patient ID: [name], female   DOB: 1941-02-15, 73 y.o.   MRN: [id number]     Allergies  Allergen Reactions  . Ace Inhibitors   . Amoxicillin     REACTION: unknown? pain in right kidney  . Benicar Hct [Olmesartan Medoxomil-Hctz]     Extreme weakness  . Budesonide-Formoterol Fumarate     Causes tremors and numbness  . Chlorzoxazone [Chlorzoxazone]   . Citalopram Hydrobromide     REACTION: hives  . Citalopram Hydrobromide   . Clonidine Derivatives   . Droperidol     REACTION: hives  . Flexeril [Cyclobenzaprine Hcl]   . Ketorolac Tromethamine     REACTION: hives  . Ketorolac Tromethamine   . Metoclopramide Hcl   . Mometasone Furo-Formoterol Fum     Causes sore throat, blurred vision and unable to sleep--started on med 09-17-10  . Morphine     REACTION: hives and itching  . Olmesartan Medoxomil     REACTION: fatique    Chief Complaint  Patient presents with  . Establish Care    New Patient establish care, recommendation for alternative to Nexium and Advair (no longer covered by [employer]) . Patient is fasting if any labs due     HPI: Patient is a 73 y.o. female seen in the office today to establish care; was seeing pulmonary and due to changes needed to go to primary care office  Needs alternative for advair due to insurance change but does not know what they will cover  Also needs something else instead of nexium for GERD, has bad indigestion and does not wish to go without medication  Sees dr young and gets weekly allergy shots Review of Systems:  Review of Systems  Constitutional: Positive for weight loss (intended weight loss). Negative for fever, chills and malaise/fatigue.  HENT: Positive for hearing loss. Negative for congestion, ear pain, sore throat and tinnitus.   Eyes:       Cataracts- sees eye MD  Respiratory: Positive for wheezing (at times, controlled on medication). Negative for cough and shortness of breath.   Cardiovascular: Negative for chest pain  and palpitations.  Gastrointestinal: Positive for heartburn. Negative for abdominal pain, diarrhea, constipation and blood in stool.  Genitourinary: Negative for dysuria.  Musculoskeletal: Positive for joint pain (joints in fingers, knees). Negative for myalgias.  Skin: Negative for itching and rash.  Neurological: Positive for dizziness (chronic). Negative for tingling, weakness and headaches.  Endo/Heme/Allergies: Positive for environmental allergies.  Psychiatric/Behavioral: Negative for depression and suicidal ideas. The patient is nervous/anxious and has insomnia.     Past Medical History  Diagnosis Date  . ALLERGIC RHINITIS   . Hypertension   . Asthma   . Hypercholesterolemia     borderline  . GERD (gastroesophageal reflux disease)   . Diverticulosis of colon   . IBS (irritable bowel syndrome)   . Colon polyp   . History of nephrolithiasis   . Osteoarthritis   . Fibromyalgia   . Vitamin D deficiency   . Postconcussion syndrome   . Cervical strain, acute   . Dizziness   . Anxiety   . Personal history of allergy to unspecified medicinal agent   . Blood type O+    Past Surgical History  Procedure Laterality Date  . Cholecystectomy, laparoscopic  2004    for gallstone pancreatitis  . Thyroid surgery    . Knee surgery Right   . Knee surgery Left    Social History:   reports that she quit smoking  about 51 years ago. Her smoking use included Cigarettes. She has a 16 pack-year smoking history. She has never used smokeless tobacco. She reports that she does not drink alcohol or use illicit drugs.  Family History  Problem Relation Age of Onset  . Breast cancer Mother   . Alzheimer's disease Mother   . Heart disease Mother   . Ovarian cancer Paternal Grandmother   . Arthritis Other     Cousins   . COPD Brother   . COPD Cousin     Maternal side   . Heart attack Maternal Uncle   . Heart disease Sister   . Alcohol abuse Sister     Medications: Patient's Medications   New Prescriptions   No medications on file  Previous Medications   ACETAMINOPHEN (TYLENOL) 500 MG TABLET    Take 500 mg by mouth every 6 (six) hours as needed (for headache as needed).    ALBUTEROL (PROVENTIL HFA;VENTOLIN HFA) 108 (90 BASE) MCG/ACT INHALER    Inhale 2 puffs into the lungs every 4 (four) hours as needed for wheezing or shortness of breath. RESCUE   ASPIRIN 81 MG EC TABLET    Take 81 mg by mouth daily.     CETIRIZINE HCL (ZYRTEC ALLERGY) 10 MG CAPS    Take 1 capsule by mouth daily.     CHOLECALCIFEROL (VITAMIN D3) 1000 UNITS CAPS    Take by mouth daily.     FLUTICASONE (FLONASE) 50 MCG/ACT NASAL SPRAY    USE 2 SPRAYS IN EACH NOSTRIL TWICE A DAY   FLUTICASONE-SALMETEROL (ADVAIR DISKUS) 250-50 MCG/DOSE AEPB    Inhale 1 puff into the lungs 2 (two) times daily.   HYDROCHLOROTHIAZIDE (MICROZIDE) 12.5 MG CAPSULE    TAKE 1 CAPSULE EVERY DAY   INJECTION DEVICE MISC    by Does not apply route. Allergy injection once every Monday, Administered by Dr.Young   MECLIZINE (ANTIVERT) 25 MG TABLET    TAKE 1 TABLET 3 TIMES A DAY AS NEEDED FOR DIZZINESS/NAUSEA   MULTIPLE VITAMINS-MINERALS (CENTRUM SILVER PO)    Take by mouth daily.     OMEGA-3 FATTY ACIDS (FISH OIL MAXIMUM STRENGTH) 1200 MG CAPS    Take by mouth. 2 by mouth daily   POTASSIUM CHLORIDE SA (K-DUR,KLOR-CON) 20 MEQ TABLET    TAKE 1 TABLET BY MOUTH DAILY   PROPYLENE GLYCOL (SYSTANE BALANCE) 0.6 % SOLN    Place 2 drops into both eyes 2 (two) times daily.     SALINE NASAL SPRAY NA    Place 88 mcg into the nose 2 (two) times daily.  Modified Medications   Modified Medication Previous Medication   AMLODIPINE (NORVASC) 5 MG TABLET amLODipine (NORVASC) 5 MG tablet      TAKE 1 TABLET BY MOUTH EVERY DAY for Blood pressure    TAKE 1 TABLET BY MOUTH EVERY DAY   ESOMEPRAZOLE (NEXIUM) 40 MG CAPSULE NEXIUM 40 MG capsule      TAKE 1 CAPSULE BY MOUTH ONCE A DAY 30 MINUTES BEFORE THE 1ST MEAL OF THE DAY for Acid Reflux    TAKE 1 CAPSULE BY MOUTH ONCE  A DAY 30 MINUTES BEFORE THE 1ST MEAL OF THE DAY   TDAP (BOOSTRIX) 5-2.5-18.5 LF-MCG/0.5 INJECTION Tdap (BOOSTRIX) 5-2.5-18.5 LF-MCG/0.5 injection      Inject 0.5 mLs into the muscle once.    Inject 0.5 mLs into the muscle once.   ZOSTER VACCINE LIVE, PF, (ZOSTAVAX) 09628 UNT/0.65ML INJECTION zoster vaccine live, PF, (ZOSTAVAX) 36629 UNT/0.65ML injection  Inject 19,400 Units into the skin once.    Inject 0.65 mLs into the skin once.  Discontinued Medications   FAMCICLOVIR (FAMVIR) 500 MG TABLET    Take 1 tablet (500 mg total) by mouth 3 (three) times daily.   OMEGA-3 FATTY ACIDS (FISH OIL) 1000 MG CAPS    Take 1 capsule by mouth 2 (two) times daily.    SODIUM CHLORIDE-SODIUM BICARB (SINUS Lyle NETI POT NA)    by Nasal route as needed.     TRAMADOL (ULTRAM) 50 MG TABLET    Take 1-2 every 4 hours if still coughing on Delsym. As needed      Physical Exam:  Filed Vitals:   08/25/13 0857  BP: 130/72  Pulse: 67  Temp: 99.1 F (37.3 C)  TempSrc: Oral  Resp: 10  Height: 5' 3"  (1.6 m)  Weight: 140 lb 12.8 oz (63.866 kg)  SpO2: 96%    Physical Exam  Constitutional: She is oriented to person, place, and time and well-developed, well-nourished, and in no distress.  HENT:  Head: Normocephalic and atraumatic.  Right Ear: External ear normal.  Left Ear: External ear normal.  Nose: Nose normal.  Mouth/Throat: Oropharynx is clear and moist. No oropharyngeal exudate.  Eyes: Conjunctivae and EOM are normal. Pupils are equal, round, and reactive to light.  Neck: Normal range of motion. Neck supple.  Cardiovascular: Normal rate, regular rhythm and normal heart sounds.   Pulmonary/Chest: Effort normal and breath sounds normal. No respiratory distress.  Abdominal: Soft. Bowel sounds are normal. She exhibits no distension.  Musculoskeletal: She exhibits no edema and no tenderness.  Neurological: She is alert and oriented to person, place, and time.  Skin: Skin is warm and dry.  Psychiatric:  Her mood appears anxious.    Labs reviewed: Lipid Panel:  Recent Labs  01/01/13 1035  CHOL 182  HDL 48.50  LDLCALC 110*  TRIG 120.0  CHOLHDL 4    Assessment/Plan 1. HYPERTENSION -controlled on current medication - CBC With differential/Platelet  2. Asthma, allergic -currently on advair but pt reports insurance is not covering, however pt has allergy to Mometasone Furo-formoterol Fum; pt to contact insurance company and see what alternatives are accepted/preferred and we will go from there, sample provided for advair 250/50  3. GERD -has hard to control GERD, currently on nexium but this is not covered by insurance will stop this and change to protonix at this time - pantoprazole (PROTONIX) 40 MG tablet; Take 1 tablet (40 mg total) by mouth daily.  Dispense: 30 tablet; Refill: 3  4. FIBROMYALGIA -stable  5. OSTEOARTHRITIS -remains stable; conts on tylenol as needed  - CBC With differential/Platelet  6. Other and unspecified hyperlipidemia -cont diet and exercise modifications, will follow up labs  - Lipid panel - Comprehensive metabolic panel  7. ANXIETY -reports she does have an anxious personality but does not effect her every day life, able to cope effectively  8. VITAMIN D DEFICIENCY -conts on supplements    Will get blood work today, follow up in 4-6 weeks for EV with [facility]

## 2013-08-26 LAB — CBC WITH DIFFERENTIAL
BASOS: 0 %
Basophils Absolute: 0 10*3/uL (ref 0.0–0.2)
EOS: 1 %
Eosinophils Absolute: 0.1 10*3/uL (ref 0.0–0.4)
HEMATOCRIT: 42.1 % (ref 34.0–46.6)
HEMOGLOBIN: 14 g/dL (ref 11.1–15.9)
Immature Grans (Abs): 0 10*3/uL (ref 0.0–0.1)
Immature Granulocytes: 0 %
LYMPHS ABS: 3.7 10*3/uL — AB (ref 0.7–3.1)
Lymphs: 57 %
MCH: 28.4 pg (ref 26.6–33.0)
MCHC: 33.3 g/dL (ref 31.5–35.7)
MCV: 85 fL (ref 79–97)
MONOCYTES: 9 %
MONOS ABS: 0.6 10*3/uL (ref 0.1–0.9)
NEUTROS ABS: 2.2 10*3/uL (ref 1.4–7.0)
Neutrophils Relative %: 33 %
Platelets: 299 10*3/uL (ref 150–379)
RBC: 4.93 x10E6/uL (ref 3.77–5.28)
RDW: 13.6 % (ref 12.3–15.4)
WBC: 6.6 10*3/uL (ref 3.4–10.8)

## 2013-08-26 LAB — LIPID PANEL
CHOL/HDL RATIO: 3.7 ratio (ref 0.0–4.4)
Cholesterol, Total: 217 mg/dL — ABNORMAL HIGH (ref 100–199)
HDL: 58 mg/dL (ref >39)
LDL Calculated: 138 mg/dL — ABNORMAL HIGH (ref 0–99)
Triglycerides: 105 mg/dL (ref 0–149)
VLDL CHOLESTEROL CAL: 21 mg/dL (ref 5–40)

## 2013-08-26 LAB — COMPREHENSIVE METABOLIC PANEL
A/G RATIO: 2.1 (ref 1.1–2.5)
ALT: 16 IU/L (ref 0–32)
AST: 23 IU/L (ref 0–40)
Albumin: 4.7 g/dL (ref 3.5–4.8)
Alkaline Phosphatase: 101 IU/L (ref 39–117)
BILIRUBIN TOTAL: 0.8 mg/dL (ref 0.0–1.2)
BUN/Creatinine Ratio: 22 (ref 11–26)
BUN: 14 mg/dL (ref 8–27)
CHLORIDE: 103 mmol/L (ref 97–108)
CO2: 28 mmol/L (ref 18–29)
Calcium: 10.1 mg/dL (ref 8.7–10.3)
Creatinine, Ser: 0.65 mg/dL (ref 0.57–1.00)
GFR, EST AFRICAN AMERICAN: 101 mL/min/{1.73_m2} (ref >59)
GFR, EST NON AFRICAN AMERICAN: 88 mL/min/{1.73_m2} (ref >59)
Globulin, Total: 2.2 g/dL (ref 1.5–4.5)
Glucose: 95 mg/dL (ref 65–99)
Potassium: 5.2 mmol/L (ref 3.5–5.2)
SODIUM: 146 mmol/L — AB (ref 134–144)
Total Protein: 6.9 g/dL (ref 6.0–8.5)

## 2013-08-27 ENCOUNTER — Encounter: Payer: Self-pay | Admitting: *Deleted

## 2013-08-30 ENCOUNTER — Ambulatory Visit (INDEPENDENT_AMBULATORY_CARE_PROVIDER_SITE_OTHER): Payer: Medicare Other

## 2013-08-30 DIAGNOSIS — J309 Allergic rhinitis, unspecified: Secondary | ICD-10-CM | POA: Diagnosis not present

## 2013-09-06 ENCOUNTER — Ambulatory Visit (INDEPENDENT_AMBULATORY_CARE_PROVIDER_SITE_OTHER): Payer: Medicare Other

## 2013-09-06 DIAGNOSIS — J309 Allergic rhinitis, unspecified: Secondary | ICD-10-CM | POA: Diagnosis not present

## 2013-09-07 ENCOUNTER — Ambulatory Visit: Payer: Medicare Other | Admitting: Internal Medicine

## 2013-09-13 ENCOUNTER — Ambulatory Visit (INDEPENDENT_AMBULATORY_CARE_PROVIDER_SITE_OTHER): Payer: Medicare Other

## 2013-09-13 DIAGNOSIS — J309 Allergic rhinitis, unspecified: Secondary | ICD-10-CM | POA: Diagnosis not present

## 2013-09-20 ENCOUNTER — Ambulatory Visit (INDEPENDENT_AMBULATORY_CARE_PROVIDER_SITE_OTHER): Payer: Medicare Other

## 2013-09-20 DIAGNOSIS — J309 Allergic rhinitis, unspecified: Secondary | ICD-10-CM

## 2013-09-21 ENCOUNTER — Other Ambulatory Visit: Payer: Self-pay | Admitting: Pulmonary Disease

## 2013-09-21 ENCOUNTER — Ambulatory Visit: Payer: Medicare Other | Admitting: Pulmonary Disease

## 2013-09-22 ENCOUNTER — Encounter: Payer: Self-pay | Admitting: Nurse Practitioner

## 2013-09-22 ENCOUNTER — Ambulatory Visit (INDEPENDENT_AMBULATORY_CARE_PROVIDER_SITE_OTHER): Payer: Medicare Other | Admitting: Nurse Practitioner

## 2013-09-22 VITALS — BP 138/72 | HR 78 | Temp 98.9°F | Resp 10 | Ht 62.08 in | Wt 138.0 lb

## 2013-09-22 DIAGNOSIS — I10 Essential (primary) hypertension: Secondary | ICD-10-CM

## 2013-09-22 DIAGNOSIS — E559 Vitamin D deficiency, unspecified: Secondary | ICD-10-CM | POA: Diagnosis not present

## 2013-09-22 DIAGNOSIS — E785 Hyperlipidemia, unspecified: Secondary | ICD-10-CM | POA: Diagnosis not present

## 2013-09-22 DIAGNOSIS — Z13820 Encounter for screening for osteoporosis: Secondary | ICD-10-CM

## 2013-09-22 DIAGNOSIS — Z780 Asymptomatic menopausal state: Secondary | ICD-10-CM

## 2013-09-22 DIAGNOSIS — Z1239 Encounter for other screening for malignant neoplasm of breast: Secondary | ICD-10-CM

## 2013-09-22 NOTE — Progress Notes (Signed)
Passed clock drawing

## 2013-09-22 NOTE — Patient Instructions (Addendum)
Follow up in 6 months with Dr Bubba Camp or [facility] fasting blood work before visit  Cardiac Diet This diet can help prevent heart disease and stroke. Many factors influence your heart health, including eating and exercise habits. Coronary risk rises a lot with abnormal blood fat (lipid) levels. Cardiac meal planning includes limiting unhealthy fats, increasing healthy fats, and making other small dietary changes. General guidelines are as follows:  Adjust calorie intake to reach and maintain desirable body weight.  Limit total fat intake to less than 30% of total calories. Saturated fat should be less than 7% of calories.  Saturated fats are found in animal products and in some vegetable products. Saturated vegetable fats are found in coconut oil, cocoa butter, palm oil, and palm kernel oil. Read labels carefully to avoid these products as much as possible. Use butter in moderation. Choose tub margarines and oils that have 2 grams of fat or less. Good cooking oils are canola and olive oils.  Practice low-fat cooking techniques. Do not fry food. Instead, broil, bake, boil, steam, grill, roast on a rack, stir-fry, or microwave it. Other fat reducing suggestions include:  Remove the skin from poultry.  Remove all visible fat from meats.  Skim the fat off stews, soups, and gravies before serving them.  Steam vegetables in water or broth instead of sauting them in fat.  Avoid foods with trans fat (or hydrogenated oils), such as commercially fried foods and commercially baked goods. Commercial shortening and deep-frying fats will contain trans fat.  Increase intake of fruits, vegetables, whole grains, and legumes to replace foods high in fat.  Increase consumption of nuts, legumes, and seeds to at least 4 servings weekly. One serving of a legume equals  cup, and 1 serving of nuts or seeds equals  cup.  Choose whole grains more often. Have 3 servings per day (a serving is 1 ounce [oz]).  Eat 4 to 5  servings of vegetables per day. A serving of vegetables is 1 cup of raw leafy vegetables;  cup of raw or cooked cut-up vegetables;  cup of vegetable juice.  Eat 4 to 5 servings of fruit per day. A serving of fruit is 1 medium whole fruit;  cup of dried fruit;  cup of fresh, frozen, or canned fruit;  cup of 100% fruit juice.  Increase your intake of dietary fiber to 20 to 30 grams per day. Insoluble fiber may help lower your risk of heart disease and may help curb your appetite.  Soluble fiber binds cholesterol to be removed from the blood. Foods high in soluble fiber are dried beans, citrus fruits, oats, apples, bananas, broccoli, Brussels sprouts, and eggplant.  Try to include foods fortified with plant sterols or stanols, such as yogurt, breads, juices, or margarines. Choose several fortified foods to achieve a daily intake of 2 to 3 grams of plant sterols or stanols.  Foods with omega-3 fats can help reduce your risk of heart disease. Aim to have a 3.5 oz portion of fatty fish twice per week, such as salmon, mackerel, albacore tuna, sardines, lake trout, or herring. If you wish to take a fish oil supplement, choose one that contains 1 gram of both DHA and EPA.  Limit processed meats to 2 servings (3 oz portion) weekly.  Limit the sodium in your diet to 1500 milligrams (mg) per day. If you have high blood pressure, talk to a registered dietitian about a DASH (Dietary Approaches to Stop Hypertension) eating plan.  Limit sweets and  beverages with added sugar, such as soda, to no more than 5 servings per week. One serving is:   1 tablespoon sugar.  1 tablespoon jelly or jam.   cup sorbet.  1 cup lemonade.   cup regular soda. CHOOSING FOODS Starches  Allowed: Breads: All kinds (wheat, rye, raisin, white, oatmeal, [place], [place], and English muffin bread). Low-fat rolls: English muffins, frankfurter and hamburger buns, bagels, pita bread, tortillas (not fried). Pancakes, waffles,  biscuits, and muffins made with recommended oil.  Avoid: Products made with saturated or trans fats, oils, or whole milk products. Butter rolls, cheese breads, croissants. Commercial doughnuts, muffins, sweet rolls, biscuits, waffles, pancakes, store-bought mixes. Crackers  Allowed: Low-fat crackers and snacks: Animal, graham, rye, saltine (with recommended oil, no lard), oyster, and matzo crackers. Bread sticks, melba toast, rusks, flatbread, pretzels, and light popcorn.  Avoid: High-fat crackers: cheese crackers, butter crackers, and those made with coconut, palm oil, or trans fat (hydrogenated oils). Buttered popcorn. Cereals  Allowed: Hot or cold whole-grain cereals.  Avoid: Cereals containing coconut, hydrogenated vegetable fat, or animal fat. Potatoes / Pasta / Rice  Allowed: All kinds of potatoes, rice, and pasta (such as macaroni, spaghetti, and noodles).  Avoid: Pasta or rice prepared with cream sauce or high-fat cheese. Chow mein noodles, [place] fries. Vegetables  Allowed: All vegetables and vegetable juices.  Avoid: Fried vegetables. Vegetables in cream, butter, or high-fat cheese sauces. Limit coconut. Fruit in cream or custard. Protein  Allowed: Limit your intake of meat, seafood, and poultry to no more than 6 oz (cooked weight) per day. All lean, well-trimmed beef, veal, pork, and lamb. All chicken and [place] without skin. All fish and shellfish. Wild game: wild duck, rabbit, pheasant, and venison. Egg whites or low-cholesterol egg substitutes may be used as desired. Meatless dishes: recipes with dried beans, peas, lentils, and tofu (soybean curd). Seeds and nuts: all seeds and most nuts.  Avoid: Prime grade and other heavily marbled and fatty meats, such as short ribs, spare ribs, rib eye roast or steak, frankfurters, sausage, bacon, and high-fat luncheon meats, mutton. Caviar. Commercially fried fish. Domestic duck, goose, venison sausage. Organ meats: liver, gizzard,  heart, chitterlings, brains, kidney, sweetbreads. Dairy  Allowed: Low-fat cheeses: nonfat or low-fat cottage cheese (1% or 2% fat), cheeses made with part skim milk, such as mozzarella, farmers, string, or ricotta. (Cheeses should be labeled no more than 2 to 6 grams fat per oz.). Skim (or 1%) milk: liquid, powdered, or evaporated. Buttermilk made with low-fat milk. Drinks made with skim or low-fat milk or cocoa. Chocolate milk or cocoa made with skim or low-fat (1%) milk. Nonfat or low-fat yogurt.  Avoid: Whole milk cheeses, including colby, cheddar, [place], Monterey Jack, [place], [place], [place], American, Swiss, and blue. Creamed cottage cheese, cream cheese. Whole milk and whole milk products, including buttermilk or yogurt made from whole milk, drinks made from whole milk. Condensed milk, evaporated whole milk, and 2% milk. Soups and Combination Foods  Allowed: Low-fat low-sodium soups: broth, dehydrated soups, homemade broth, soups with the fat removed, homemade cream soups made with skim or low-fat milk. Low-fat spaghetti, lasagna, chili, and Spanish rice if low-fat ingredients and low-fat cooking techniques are used.  Avoid: Cream soups made with whole milk, cream, or high-fat cheese. All other soups. Desserts and Sweets  Allowed: Sherbet, fruit ices, gelatins, meringues, and angel food cake. Homemade desserts with recommended fats, oils, and milk products. Jam, jelly, honey, marmalade, sugars, and syrups. Pure sugar candy, such as gum drops,  hard candy, jelly beans, marshmallows, mints, and small amounts of dark chocolate.  Avoid: Commercially prepared cakes, pies, cookies, frosting, pudding, or mixes for these products. Desserts containing whole milk products, chocolate, coconut, lard, palm oil, or palm kernel oil. Ice cream or ice cream drinks. Candy that contains chocolate, coconut, butter, hydrogenated fat, or unknown ingredients. Buttered syrups. Fats and Oils  Allowed: Vegetable  oils: safflower, sunflower, corn, soybean, cottonseed, sesame, canola, olive, or peanut. Non-hydrogenated margarines. Salad dressing or mayonnaise: homemade or commercial, made with a recommended oil. Low or nonfat salad dressing or mayonnaise.  Limit added fats and oils to 6 to 8 tsp per day (includes fats used in cooking, baking, salads, and spreads on bread). Remember to count the "hidden fats" in foods.  Avoid: Solid fats and shortenings: butter, lard, salt pork, bacon drippings. Gravy containing meat fat, shortening, or suet. Cocoa butter, coconut. Coconut oil, palm oil, palm kernel oil, or hydrogenated oils: these ingredients are often used in bakery products, nondairy creamers, whipped toppings, candy, and commercially fried foods. Read labels carefully. Salad dressings made of unknown oils, sour cream, or cheese, such as blue cheese and Roquefort. Cream, all kinds: half-and-half, light, heavy, or whipping. Sour cream or cream cheese (even if "light" or low-fat). Nondairy cream substitutes: coffee creamers and sour cream substitutes made with palm, palm kernel, hydrogenated oils, or coconut oil. Beverages  Allowed: Coffee (regular or decaffeinated), tea. Diet carbonated beverages, mineral water. Alcohol: Check with your caregiver. Moderation is recommended.  Avoid: Whole milk, regular sodas, and juice drinks with added sugar. Condiments  Allowed: All seasonings and condiments. Cocoa powder. "Cream" sauces made with recommended ingredients.  Avoid: Carob powder made with hydrogenated fats. SAMPLE MENU Breakfast   cup orange juice   cup oatmeal  1 slice toast  1 tsp margarine  1 cup skim milk Lunch  [place] sandwich with 2 oz [place], 2 slices bread  Lettuce and tomato slices  Fresh fruit  Carrot sticks  Coffee or tea Snack  Fresh fruit or low-fat crackers Dinner  3 oz lean ground beef  1 baked potato  1 tsp margarine   cup asparagus  Lettuce salad  1 tbs  non-creamy dressing   cup peach slices  1 cup skim milk Document Released: 02/06/2008 Document Revised: 10/29/2011 Document Reviewed: 07/23/2011 [employer] Patient Information 2014 [place], [place].

## 2013-09-22 NOTE — Progress Notes (Signed)
Patient ID: [name], female   DOB: 12/21/1940, 73 y.o.   MRN: [id number]    Allergies  Allergen Reactions  . Ace Inhibitors   . Amoxicillin     REACTION: unknown? pain in right kidney  . Benicar Hct [Olmesartan Medoxomil-Hctz]     Extreme weakness  . Budesonide-Formoterol Fumarate     Causes tremors and numbness  . Chlorzoxazone [Chlorzoxazone]   . Citalopram Hydrobromide     REACTION: hives  . Citalopram Hydrobromide   . Clonidine Derivatives   . Droperidol     REACTION: hives  . Flexeril [Cyclobenzaprine Hcl]   . Ketorolac Tromethamine     REACTION: hives  . Ketorolac Tromethamine   . Metoclopramide Hcl   . Mometasone Furo-Formoterol Fum     Causes sore throat, blurred vision and unable to sleep--started on med 09-17-10  . Morphine     REACTION: hives and itching  . Olmesartan Medoxomil     REACTION: fatique    Chief Complaint  Patient presents with  . Annual Exam    Yearly check-up, no pap,  EKG to be completed today,   . Nutrition Counseling    Discuss diet changes to be made   . Hand Problem    Check left hand- raised area that is very painful x 3 weeks     HPI: Patient is a 73 y.o. female seen in the office today for extended visit. protonix is working well since she is off nexium Got advair from insurance  Does not wish to have any more colonoscopy, does not want PAP Needs mammogram  Needs dexa scan conts to try to eat healthy and exercising Review of Systems:  Review of Systems  Constitutional: Negative for fever, chills and malaise/fatigue.  HENT: Positive for hearing loss. Negative for congestion, ear pain, sore throat and tinnitus.   Eyes:       Cataracts- sees eye MD  Respiratory: Negative for cough, shortness of breath and wheezing.   Cardiovascular: Negative for chest pain and palpitations.  Gastrointestinal: Negative for heartburn, abdominal pain, diarrhea, constipation and blood in stool.  Genitourinary: Negative for dysuria.    Musculoskeletal: Positive for joint pain (joints in fingers, knees when she over does it). Negative for myalgias.  Skin: Negative for itching and rash.  Neurological: Positive for dizziness (chronic). Negative for tingling, weakness and headaches.  Endo/Heme/Allergies: Positive for environmental allergies.  Psychiatric/Behavioral: Negative for depression and suicidal ideas. The patient is nervous/anxious (would not want medications) and has insomnia.      Past Medical History  Diagnosis Date  . ALLERGIC RHINITIS   . Hypertension   . Asthma   . Hypercholesterolemia     borderline  . GERD (gastroesophageal reflux disease)   . Diverticulosis of colon   . IBS (irritable bowel syndrome)   . Colon polyp   . History of nephrolithiasis   . Osteoarthritis   . Fibromyalgia   . Vitamin D deficiency   . Postconcussion syndrome   . Cervical strain, acute   . Dizziness   . Anxiety   . Personal history of allergy to unspecified medicinal agent   . Blood type O+    Past Surgical History  Procedure Laterality Date  . Cholecystectomy, laparoscopic  2004    for gallstone pancreatitis  . Thyroid surgery    . Knee surgery Right   . Knee surgery Left    Social History:   reports that she quit smoking about 51 years ago. Her smoking use  included Cigarettes. She has a 16 pack-year smoking history. She has never used smokeless tobacco. She reports that she does not drink alcohol or use illicit drugs.  Family History  Problem Relation Age of Onset  . Breast cancer Mother   . Alzheimer's disease Mother   . Heart disease Mother   . Ovarian cancer Paternal Grandmother   . Arthritis Other     Cousins   . COPD Brother   . COPD Cousin     Maternal side   . Heart attack Maternal Uncle   . Heart disease Sister   . Alcohol abuse Sister     Medications: Patient's Medications  New Prescriptions   No medications on file  Previous Medications   ACETAMINOPHEN (TYLENOL) 500 MG TABLET    Take  500 mg by mouth every 6 (six) hours as needed (for headache as needed).    ALBUTEROL (PROVENTIL HFA;VENTOLIN HFA) 108 (90 BASE) MCG/ACT INHALER    Inhale 2 puffs into the lungs every 4 (four) hours as needed for wheezing or shortness of breath. RESCUE   AMLODIPINE (NORVASC) 5 MG TABLET    TAKE 1 TABLET BY MOUTH EVERY DAY for Blood pressure   ASPIRIN 81 MG EC TABLET    Take 81 mg by mouth daily.     CETIRIZINE HCL (ZYRTEC ALLERGY) 10 MG CAPS    Take 1 capsule by mouth daily.     CHOLECALCIFEROL (VITAMIN D3) 1000 UNITS CAPS    Take by mouth daily.     FLUTICASONE (FLONASE) 50 MCG/ACT NASAL SPRAY    USE 2 SPRAYS IN EACH NOSTRIL TWICE A DAY   FLUTICASONE-SALMETEROL (ADVAIR DISKUS) 250-50 MCG/DOSE AEPB    Inhale 1 puff into the lungs 2 (two) times daily.   HYDROCHLOROTHIAZIDE (MICROZIDE) 12.5 MG CAPSULE    TAKE ONE CAPSULE BY MOUTH EVERY DAY   INJECTION DEVICE MISC    by Does not apply route. Allergy injection once every Monday, Administered by Dr.Young   MECLIZINE (ANTIVERT) 25 MG TABLET    TAKE 1 TABLET 3 TIMES A DAY AS NEEDED FOR DIZZINESS/NAUSEA   MULTIPLE VITAMINS-MINERALS (CENTRUM SILVER PO)    Take by mouth daily.     OMEGA-3 FATTY ACIDS (FISH OIL MAXIMUM STRENGTH) 1200 MG CAPS    Take by mouth. 2 by mouth daily   PANTOPRAZOLE (PROTONIX) 40 MG TABLET    Take 1 tablet (40 mg total) by mouth daily.   POTASSIUM CHLORIDE SA (K-DUR,KLOR-CON) 20 MEQ TABLET    TAKE 1 TABLET BY MOUTH DAILY   PROPYLENE GLYCOL (SYSTANE BALANCE) 0.6 % SOLN    Place 2 drops into both eyes 2 (two) times daily.     SALINE NASAL SPRAY NA    Place 88 mcg into the nose 2 (two) times daily.   TDAP (BOOSTRIX) 5-2.5-18.5 LF-MCG/0.5 INJECTION    Inject 0.5 mLs into the muscle once.   ZOSTER VACCINE LIVE, PF, (ZOSTAVAX) 53614 UNT/0.65ML INJECTION    Inject 19,400 Units into the skin once.  Modified Medications   No medications on file  Discontinued Medications   No medications on file     Physical Exam:  Filed Vitals:    09/22/13 1341  BP: 138/72  Pulse: 78  Temp: 98.9 F (37.2 C)  TempSrc: Oral  Resp: 10  Height: 5' 2.08" (1.577 m)  Weight: 138 lb (62.596 kg)  SpO2: 94%    Physical Exam  Constitutional: She is oriented to person, place, and time and well-developed, well-nourished, and in no  distress.  HENT:  Head: Normocephalic and atraumatic.  Right Ear: External ear normal.  Left Ear: External ear normal.  Nose: Nose normal.  Mouth/Throat: Oropharynx is clear and moist. No oropharyngeal exudate.  Eyes: Conjunctivae and EOM are normal. Pupils are equal, round, and reactive to light.  Neck: Normal range of motion. Neck supple. No JVD present. No thyromegaly present.  Cardiovascular: Normal rate, regular rhythm, normal heart sounds and intact distal pulses.   Pulmonary/Chest: Effort normal and breath sounds normal. No respiratory distress. She has no wheezes. She has no rales. She exhibits no tenderness.  Abdominal: Soft. Bowel sounds are normal. She exhibits no distension and no mass. There is no tenderness. There is no rebound and no guarding.  Musculoskeletal: Normal range of motion. She exhibits no edema and no tenderness.  Lymphadenopathy:    She has no cervical adenopathy.  Neurological: She is alert and oriented to person, place, and time.  Skin: Skin is warm and dry.  Psychiatric: Affect normal. Her mood appears anxious.     Labs reviewed: Basic Metabolic Panel:  Recent Labs  08/25/13 1027  NA 146*  K 5.2  CL 103  CO2 28  GLUCOSE 95  BUN 14  CREATININE 0.65  CALCIUM 10.1   Liver Function Tests:  Recent Labs  08/25/13 1027  AST 23  ALT 16  ALKPHOS 101  BILITOT 0.8  PROT 6.9   No results found for this basename: LIPASE, AMYLASE,  in the last 8760 hours No results found for this basename: AMMONIA,  in the last 8760 hours CBC:  Recent Labs  08/25/13 1027  WBC 6.6  NEUTROABS 2.2  HGB 14.0  HCT 42.1  MCV 85  PLT 299   Lipid Panel:  Recent Labs   01/01/13 1035 08/25/13 1027  CHOL 182  --   HDL 48.50 58  LDLCALC 110* 138*  TRIG 120.0 105  CHOLHDL 4 3.7   Assessment/Plan  1. Unspecified essential hypertension -stable at this time, conts on norvasc and HCTZ - EKG 12-Lead  2. Other and unspecified hyperlipidemia -discussed diet modifications and to cont exercise information given -does not wish to be on medication at this time, will have her work on diet and follow up lipids before next visit - EKG 12-Lead - Lipid panel; Future - Comprehensive metabolic panel; Future  3. Unspecified vitamin D deficiency -taking vit d daily  - Vitamin D, 25-hydroxy; Future  4. Screening for breast cancer - MM DIGITAL SCREENING BILATERAL; Future  5. Screening for osteoporosis - DG Bone Density; Future  6. GERD -stable on protonix  7. COPD - stable, without exacerbation  -conts on advair   8. ROUTINE GENERAL MEDICAL EXAMINATION AT HEALTH CARE   The patient is doing well and no distinct problems were identified on exam, labs discussed in detail  The patient was counseled regarding the appropriate use of alcohol, regular self-examination of the breasts on a monthly basis, prevention of dental and periodontal disease, diet, regular sustained exercise for at least 30 minutes 5 times per week, routine screening interval for mammogram as recommended by the [place] and ACOG, importance of regular PAP smears, protective clothing, tobacco use,  and recommended schedule for GI hemoccult testing, colonoscopy, cholesterol, thyroid and diabetes screening.  To follow up in 3 months

## 2013-09-27 ENCOUNTER — Ambulatory Visit (INDEPENDENT_AMBULATORY_CARE_PROVIDER_SITE_OTHER): Payer: Medicare Other

## 2013-09-27 DIAGNOSIS — J309 Allergic rhinitis, unspecified: Secondary | ICD-10-CM

## 2013-10-05 ENCOUNTER — Ambulatory Visit: Payer: Medicare Other

## 2013-10-11 ENCOUNTER — Encounter: Payer: Self-pay | Admitting: Nurse Practitioner

## 2013-10-11 ENCOUNTER — Ambulatory Visit (INDEPENDENT_AMBULATORY_CARE_PROVIDER_SITE_OTHER): Payer: Medicare Other

## 2013-10-11 DIAGNOSIS — J309 Allergic rhinitis, unspecified: Secondary | ICD-10-CM

## 2013-10-12 ENCOUNTER — Encounter: Payer: Self-pay | Admitting: Internal Medicine

## 2013-10-12 ENCOUNTER — Other Ambulatory Visit: Payer: Self-pay | Admitting: Nurse Practitioner

## 2013-10-12 DIAGNOSIS — Z1231 Encounter for screening mammogram for malignant neoplasm of breast: Secondary | ICD-10-CM

## 2013-10-12 DIAGNOSIS — Z803 Family history of malignant neoplasm of breast: Secondary | ICD-10-CM

## 2013-10-18 ENCOUNTER — Ambulatory Visit (INDEPENDENT_AMBULATORY_CARE_PROVIDER_SITE_OTHER): Payer: Medicare Other

## 2013-10-18 DIAGNOSIS — J309 Allergic rhinitis, unspecified: Secondary | ICD-10-CM

## 2013-10-20 ENCOUNTER — Other Ambulatory Visit: Payer: Self-pay | Admitting: Pulmonary Disease

## 2013-10-25 ENCOUNTER — Ambulatory Visit (INDEPENDENT_AMBULATORY_CARE_PROVIDER_SITE_OTHER): Payer: Medicare Other

## 2013-10-25 DIAGNOSIS — J309 Allergic rhinitis, unspecified: Secondary | ICD-10-CM | POA: Diagnosis not present

## 2013-11-01 ENCOUNTER — Ambulatory Visit (INDEPENDENT_AMBULATORY_CARE_PROVIDER_SITE_OTHER): Payer: Medicare Other

## 2013-11-01 DIAGNOSIS — J309 Allergic rhinitis, unspecified: Secondary | ICD-10-CM

## 2013-11-08 ENCOUNTER — Ambulatory Visit (INDEPENDENT_AMBULATORY_CARE_PROVIDER_SITE_OTHER): Payer: Medicare Other

## 2013-11-08 DIAGNOSIS — J309 Allergic rhinitis, unspecified: Secondary | ICD-10-CM

## 2013-11-15 ENCOUNTER — Ambulatory Visit (INDEPENDENT_AMBULATORY_CARE_PROVIDER_SITE_OTHER): Payer: Medicare Other

## 2013-11-15 DIAGNOSIS — J309 Allergic rhinitis, unspecified: Secondary | ICD-10-CM

## 2013-11-22 ENCOUNTER — Ambulatory Visit: Payer: Medicare Other

## 2013-11-23 ENCOUNTER — Ambulatory Visit
Admission: RE | Admit: 2013-11-23 | Discharge: 2013-11-23 | Disposition: A | Discharge status: Home / Self Care | Payer: Medicare Other | Source: Ambulatory Visit | Attending: Nurse Practitioner | Admitting: Nurse Practitioner

## 2013-11-23 DIAGNOSIS — Z1231 Encounter for screening mammogram for malignant neoplasm of breast: Secondary | ICD-10-CM

## 2013-11-23 DIAGNOSIS — Z803 Family history of malignant neoplasm of breast: Secondary | ICD-10-CM

## 2013-11-24 ENCOUNTER — Ambulatory Visit (INDEPENDENT_AMBULATORY_CARE_PROVIDER_SITE_OTHER): Payer: Medicare Other

## 2013-11-24 DIAGNOSIS — J309 Allergic rhinitis, unspecified: Secondary | ICD-10-CM

## 2013-11-29 ENCOUNTER — Ambulatory Visit (INDEPENDENT_AMBULATORY_CARE_PROVIDER_SITE_OTHER): Payer: Medicare Other

## 2013-11-29 DIAGNOSIS — J309 Allergic rhinitis, unspecified: Secondary | ICD-10-CM

## 2013-12-06 ENCOUNTER — Ambulatory Visit (INDEPENDENT_AMBULATORY_CARE_PROVIDER_SITE_OTHER): Payer: Medicare Other

## 2013-12-06 DIAGNOSIS — Z23 Encounter for immunization: Secondary | ICD-10-CM

## 2013-12-06 DIAGNOSIS — J309 Allergic rhinitis, unspecified: Secondary | ICD-10-CM | POA: Diagnosis not present

## 2013-12-08 ENCOUNTER — Ambulatory Visit (INDEPENDENT_AMBULATORY_CARE_PROVIDER_SITE_OTHER): Payer: Medicare Other

## 2013-12-08 DIAGNOSIS — J309 Allergic rhinitis, unspecified: Secondary | ICD-10-CM | POA: Diagnosis not present

## 2013-12-13 ENCOUNTER — Ambulatory Visit (INDEPENDENT_AMBULATORY_CARE_PROVIDER_SITE_OTHER): Payer: Medicare Other

## 2013-12-13 DIAGNOSIS — J309 Allergic rhinitis, unspecified: Secondary | ICD-10-CM

## 2013-12-20 ENCOUNTER — Ambulatory Visit: Payer: Medicare Other

## 2013-12-21 ENCOUNTER — Ambulatory Visit (INDEPENDENT_AMBULATORY_CARE_PROVIDER_SITE_OTHER): Payer: Medicare Other

## 2013-12-21 DIAGNOSIS — J309 Allergic rhinitis, unspecified: Secondary | ICD-10-CM | POA: Diagnosis not present

## 2013-12-27 ENCOUNTER — Ambulatory Visit (INDEPENDENT_AMBULATORY_CARE_PROVIDER_SITE_OTHER): Payer: Medicare Other

## 2013-12-27 DIAGNOSIS — J309 Allergic rhinitis, unspecified: Secondary | ICD-10-CM | POA: Diagnosis not present

## 2013-12-28 ENCOUNTER — Ambulatory Visit: Payer: Medicare Other

## 2014-01-03 ENCOUNTER — Telehealth: Payer: Self-pay | Admitting: Internal Medicine

## 2014-01-03 ENCOUNTER — Ambulatory Visit (INDEPENDENT_AMBULATORY_CARE_PROVIDER_SITE_OTHER): Payer: Medicare Other

## 2014-01-03 DIAGNOSIS — J309 Allergic rhinitis, unspecified: Secondary | ICD-10-CM

## 2014-01-03 NOTE — Telephone Encounter (Signed)
Called x 4 line was busy

## 2014-01-04 NOTE — Telephone Encounter (Signed)
Called, spoke with pt -  C/o having allergy "attack" periodically x 2 months.  Wakes up with stinging in nose and eyes, HA, and hoarseness.  Is using saline nasal spray, flonase, tylenol for HA, and zyrtec along with maintenance inhalers.  Requesting OV soon with CY to discuss having allergy vaccine adjusted.  No appts in August or Sept available.  Dr. Annamaria Boots, pls advise.  Thank you.  Last OV with CY: 07/2012  Allergies  Allergen Reactions  . Ace Inhibitors   . Amoxicillin     REACTION: unknown? pain in right kidney  . Benicar Hct [Olmesartan Medoxomil-Hctz]     Extreme weakness  . Budesonide-Formoterol Fumarate     Causes tremors and numbness  . Chlorzoxazone [Chlorzoxazone]   . Citalopram Hydrobromide     REACTION: hives  . Citalopram Hydrobromide   . Clonidine Derivatives   . Droperidol     REACTION: hives  . Flexeril [Cyclobenzaprine Hcl]   . Ketorolac Tromethamine     REACTION: hives  . Ketorolac Tromethamine   . Metoclopramide Hcl   . Mometasone Furo-Formoterol Fum     Causes sore throat, blurred vision and unable to sleep--started on med 09-17-10  . Morphine     REACTION: hives and itching  . Olmesartan Medoxomil     REACTION: fatique      Current Outpatient Prescriptions on File Prior to Visit  Medication Sig Dispense Refill  . acetaminophen (TYLENOL) 500 MG tablet Take 500 mg by mouth every 6 (six) hours as needed (for headache as needed).       Marland Kitchen albuterol (PROVENTIL HFA;VENTOLIN HFA) 108 (90 BASE) MCG/ACT inhaler Inhale 2 puffs into the lungs every 4 (four) hours as needed for wheezing or shortness of breath. RESCUE  1 Inhaler  6  . amLODipine (NORVASC) 5 MG tablet TAKE 1 TABLET BY MOUTH EVERY DAY for Blood pressure      . aspirin 81 MG EC tablet Take 81 mg by mouth daily.        . Cetirizine HCl (ZYRTEC ALLERGY) 10 MG CAPS Take 1 capsule by mouth daily.        . Cholecalciferol (VITAMIN D3) 1000 UNITS CAPS Take by mouth daily.        . fluticasone (FLONASE) 50  MCG/ACT nasal spray USE 2 SPRAYS IN EACH NOSTRIL TWICE A DAY  16 g  6  . Fluticasone-Salmeterol (ADVAIR DISKUS) 250-50 MCG/DOSE AEPB Inhale 1 puff into the lungs 2 (two) times daily.  180 each  1  . hydrochlorothiazide (MICROZIDE) 12.5 MG capsule TAKE ONE CAPSULE BY MOUTH EVERY DAY  30 capsule  6  . Injection Device MISC by Does not apply route. Allergy injection once every Monday, Administered by Dr.Young      . KLOR-CON M20 20 MEQ tablet TAKE 1 TABLET BY MOUTH DAILY  30 tablet  6  . meclizine (ANTIVERT) 25 MG tablet TAKE 1 TABLET 3 TIMES A DAY AS NEEDED FOR DIZZINESS/NAUSEA  30 tablet  1  . Multiple Vitamins-Minerals (CENTRUM SILVER PO) Take by mouth daily.        . Omega-3 Fatty Acids (FISH OIL MAXIMUM STRENGTH) 1200 MG CAPS Take by mouth. 2 by mouth daily      . pantoprazole (PROTONIX) 40 MG tablet Take 1 tablet (40 mg total) by mouth daily.  30 tablet  3  . Propylene Glycol (SYSTANE BALANCE) 0.6 % SOLN Place 2 drops into both eyes 2 (two) times daily.        Marland Kitchen  SALINE NASAL SPRAY NA Place 88 mcg into the nose 2 (two) times daily.      . Tdap (BOOSTRIX) 5-2.5-18.5 LF-MCG/0.5 injection Inject 0.5 mLs into the muscle once.  0.5 mL  0  . zoster vaccine live, PF, (ZOSTAVAX) 34144 UNT/0.65ML injection Inject 19,400 Units into the skin once.  1 each  0   No current facility-administered medications on file prior to visit.

## 2014-01-04 NOTE — Telephone Encounter (Signed)
Pt can come in on Thursday 01-06-14 at 9:00am with CY. Thanks.

## 2014-01-04 NOTE — Telephone Encounter (Signed)
appt scheduled for 8/27 @9 .  Pt aware.  Nothing further needed.

## 2014-01-05 ENCOUNTER — Ambulatory Visit (INDEPENDENT_AMBULATORY_CARE_PROVIDER_SITE_OTHER): Payer: Medicare Other | Admitting: Internal Medicine

## 2014-01-05 ENCOUNTER — Encounter: Payer: Self-pay | Admitting: Internal Medicine

## 2014-01-05 VITALS — BP 122/70 | HR 69 | Temp 98.1°F | Ht 62.08 in | Wt 143.0 lb

## 2014-01-05 DIAGNOSIS — IMO0001 Reserved for inherently not codable concepts without codable children: Secondary | ICD-10-CM | POA: Diagnosis not present

## 2014-01-05 DIAGNOSIS — M791 Myalgia, unspecified site: Secondary | ICD-10-CM

## 2014-01-05 MED ORDER — NAPROXEN 500 MG PO TABS: 500.0000 mg | ORAL_TABLET | Freq: Two times a day (BID) | ORAL | Status: DC

## 2014-01-05 NOTE — Progress Notes (Signed)
Patient ID: [name], female   DOB: December 01, 1940, 73 y.o.   MRN: [id number]    Chief Complaint  Patient presents with  . Arm Pain    Right arm/palm pain x 3 months, no known injury    Allergies  Allergen Reactions  . Ace Inhibitors   . Amoxicillin     REACTION: unknown? pain in right kidney  . Benicar Hct [Olmesartan Medoxomil-Hctz]     Extreme weakness  . Budesonide-Formoterol Fumarate     Causes tremors and numbness  . Chlorzoxazone [Chlorzoxazone]   . Citalopram Hydrobromide     REACTION: hives  . Citalopram Hydrobromide   . Clonidine Derivatives   . Droperidol     REACTION: hives  . Flexeril [Cyclobenzaprine Hcl]   . Ketorolac Tromethamine     REACTION: hives  . Ketorolac Tromethamine   . Metoclopramide Hcl   . Mometasone Furo-Formoterol Fum     Causes sore throat, blurred vision and unable to sleep--started on med 09-17-10  . Morphine     REACTION: hives and itching  . Olmesartan Medoxomil     REACTION: fatique   HPI 73 y/o female patient is here for acute visit. She has been having pain in her right arm and in her right palm on and off for 3 months now. She has noticed knots in her palm which hurts her. Denies any pain in wrist. No pain in her digits and no limitation in working with her hand/palm. Now has aching pain/ discomfort in her right arm- more in the outer area. Denies any trauma. No swelling or redness or warmth noted Has tried aleve without any help.  ROS Denies fever or chills Denies aches/pain in any other muscle group  Medication reviewed. [facility]  Physical exam BP 122/70  Pulse 69  Temp(Src) 98.1 F (36.7 C) (Oral)  Ht 5' 2.08" (1.577 m)  Wt 143 lb (64.864 kg)  BMI 26.08 kg/m2  SpO2 97%  General- elderly female in no acute distress Head- atraumatic, normocephalic Neck- no lymphadenopathy, no thyromegaly Cardiovascular- normal s1,s2, no murmurs Respiratory- bilateral clear to auscultation, no wheeze, no rhonchi, no  crackles Musculoskeletal- able to move all 4 extremities, no bone tenderness, has muscle tenderness on palpation in right arm, right palm has 2 small palpable nodule and a palpable cord with some discomfort on palpation but no erythema noted, no discoloration, good radial pulses, no joint deformity Neurological- no focal deficit Psychiatry- alert and oriented    Lab Results  Component Value Date   TSH 1.00 07/02/2012   Lab Results  Component Value Date   CREATININE 0.65 08/25/2013   Assessment/plan  1. Muscle pain Not on any medication that can cause myopathy. Will check her ck and ESR to assess for muscle breakdown and inflammatory process. Also rule out thyroid abnormality. The palpable cord in the palm appears to be like duptyren's contracture. Review labs and consider rheumatology referral if indicated for further workup - CK - Sedimentation Rate - TSH

## 2014-01-06 ENCOUNTER — Encounter: Payer: Self-pay | Admitting: Internal Medicine

## 2014-01-06 ENCOUNTER — Ambulatory Visit: Payer: Medicare Other | Admitting: Internal Medicine

## 2014-01-06 VITALS — BP 126/80 | HR 66 | Ht 63.0 in | Wt 143.2 lb

## 2014-01-06 DIAGNOSIS — R0981 Nasal congestion: Secondary | ICD-10-CM

## 2014-01-06 LAB — SEDIMENTATION RATE: SED RATE: 2 mm/h (ref 0–40)

## 2014-01-06 LAB — TSH: TSH: 1.71 u[IU]/mL (ref 0.450–4.500)

## 2014-01-06 LAB — CK: CK TOTAL: 67 U/L (ref 24–173)

## 2014-01-06 NOTE — Progress Notes (Signed)
01/16/12- 03/04/11- 86 yoF former smoker referred courtesy of Dr [name] for allergy evaluation. She says for most of her adult life she has had seasonal rhinitis. Allergy skin testing remotely by [facility] led to allergy vaccine several decades ago. She cannot list all of the medications she has felt intolerant to. Some of them may have been associated with urticaria and itching. Food intolerance includes peanut butter/nausea, ice cream/diarrhea. Insect stings, mainly mosquitoes, caused local swelling and itching.  Since July 2012 she has had at least 4 "attacks" dizziness, frontal headache particularly lying down at night, deep cough and sneeze. She had seen Dr. Ovid Curd and our nurse practitioner. There is no lasting benefit from antibiotics. CT scan of the sinuses was negative. Between "attacks" she feels well. Triggers seem to include strong smells, wet leaves and rainy weather. Loud music makes her dizzy causes headache and discomfort around the eyes. She has had no ENT surgery and no asthma. She has had a number of emergency room trips for "allergy attack". She lives in an apartment with central air conditioning, wall-to-wall carpet, no mold and no pets.  04/09/11- 18 yoF former smoker followed for allergic rhinitis, asthma, food intolerance, multiple medication intolerance. In the last day or so she has been aware of some cough with frontal headache but she has had a sense of dizziness with photophobia and sensitivity to sound probably for 2 or 3 weeks. She blames her discomfort on "allergy". I suggested  head congestion, possibly some migraine, dry heat and maybe a viral infection could explain her symptoms better. She has had intermittent epistaxis from the left nostril. Aware of reflux mostly controlled with Nexium. Allergy profile 03/04/2011-IgE 13.5, no elevation of specific antibody levels on this panel. ESR-normal.  07/24/11-  71 yoF former smoker followed for allergic rhinitis, asthma, food  intolerance, multiple medication intolerance. Blames "allergy" for recurrent episodes of bronchitis through the winter. Asks if proximity to her wood kitchen cabinets could cause this. Told her no, and educated allergic vs viral. PFT 04/18/11- reviewed- Normal spirometry flows and lung volumes. Reduced DLCO 67 Allergy skin testing- Positive intradermal reactions to weed and tree pollens, dust mite, mold.  10/15/11-   24 yoF former smoker followed for allergic rhinitis, asthma, food intolerance, multiple medication intolerance. Feels like allergy vaccine is working well. Continues building vaccine now at 1:500. Spring and fall are usually her worst seasons and she has done well this spring.  01/16/12- 24 yoF former smoker followed for allergic rhinitis, asthma, food intolerance, multiple medication intolerance. Recent flare up of allergies-change in weather; still on vaccine and feels its working well for her. Blames rain for wheezy bronchitis and chest congestion. Says she is "really pleased" with her allergy vaccine now at 1:50 [place]. If she can control rhinitis she thinks she can prevent flareups of her asthmatic bronchitis. Wants to wait on flu vaccine.  08/07/12- 89 yoF former smoker followed for allergic rhinitis, asthma, food intolerance, multiple medication intolerance. FOLLOWS FOR: doing so much better on vaccine-"feels like I have my life back again" Continues allergy vaccine 1:50 [facility] with no problems. She remains outspoken about the improvement she credits it with giving her. Occasional sneeze. Zyrtec at bedtime. No asthma at all.  01/06/14- 73 yoF former smoker followed for allergic rhinitis, asthma, food intolerance, multiple medication intolerance. ACUTE VISIT: having allergy flare up(head pain, hoarseness, tired,ear aches, and mild sore throat.)-lasts about 2 days after getting allergy injection on Monday then this starts on Saturday.   ROS-see HPI  Constitutional:   No-   weight loss,  night sweats, fevers, chills, fatigue, lassitude. HEENT:   No- headaches, difficulty swallowing, tooth/dental problems, sore throat,       +Minimal sneezing, no-itching, ear ache, nasal congestion, post nasal drip,  CV:  No-   chest pain, orthopnea, PND, swelling in lower extremities, anasarca, dizziness, palpitations Resp: No-   shortness of breath with exertion or at rest.              No-   productive cough,  non-productive cough,  No- coughing up of blood.              No-   change in color of mucus.  +minimal recent wheezing.   Skin: No-   rash or lesions. GI:  No-   heartburn, indigestion, abdominal pain, nausea, vomiting,  GU: MS:  No-   joint pain or swelling.   Neuro-     nothing unusual Psych:  No- change in mood or affect. No depression or anxiety.  No memory loss.  OBJ General- Alert, Oriented, Affect-appropriate, Distress- none acute. Pleasant and talkative, over weight Skin- rash-none, lesions- none, excoriation- none Lymphadenopathy- none Head- atraumatic            Eyes- Gross vision intact, PERRLA, conjunctivae clear secretions            Ears- Hearing, canals-normal            Nose- no-mucus bridging, no-Septal dev, polyps, erosion, perforation             Throat- Mallampati II , mucosa red , drainage- none, tonsils- atrophic Neck- flexible , trachea midline, no stridor , thyroid nl, carotid no bruit Chest - symmetrical excursion , unlabored           Heart/CV- RRR , no murmur , no gallop  , no rub, nl s1 s2                           - JVD- none , edema- none, stasis changes- none, varices- none           Lung- +trace inspiratory wheeze, cough-none, dullness-none, rub- none           Chest wall-  Abd- Br/ Gen/ Rectal- Not done, not indicated Extrem- cyanosis- none, clubbing, none, atrophy- none, strength- nl Neuro- grossly intact to observation

## 2014-01-06 NOTE — Patient Instructions (Signed)
We can continue allergy shots for now at 1:50 [facility] but watch to see if they are wearing off by the end of the week.  For now, on Friday, Saturday and Sunday nights, instead of Flonase, try 1 spray each nostril of the sample Dymista nasal spray. See if you feel better on the weekends. Otherwise you can continue the Flonase as you have been using it.

## 2014-01-07 ENCOUNTER — Encounter: Payer: Self-pay | Admitting: *Deleted

## 2014-01-10 ENCOUNTER — Ambulatory Visit (INDEPENDENT_AMBULATORY_CARE_PROVIDER_SITE_OTHER): Payer: Medicare Other

## 2014-01-10 DIAGNOSIS — J309 Allergic rhinitis, unspecified: Secondary | ICD-10-CM | POA: Diagnosis not present

## 2014-01-12 ENCOUNTER — Ambulatory Visit: Payer: Medicare Other | Admitting: Internal Medicine

## 2014-01-12 ENCOUNTER — Other Ambulatory Visit: Payer: Self-pay | Admitting: *Deleted

## 2014-01-12 ENCOUNTER — Encounter: Payer: Self-pay | Admitting: Internal Medicine

## 2014-01-12 MED ORDER — PANTOPRAZOLE SODIUM 40 MG PO TBEC: 40.0000 mg | DELAYED_RELEASE_TABLET | Freq: Every day | ORAL | Status: DC

## 2014-01-12 NOTE — Telephone Encounter (Signed)
[name]

## 2014-01-18 ENCOUNTER — Ambulatory Visit (INDEPENDENT_AMBULATORY_CARE_PROVIDER_SITE_OTHER): Payer: Medicare Other | Admitting: Internal Medicine

## 2014-01-18 ENCOUNTER — Encounter: Payer: Self-pay | Admitting: Internal Medicine

## 2014-01-18 ENCOUNTER — Ambulatory Visit
Admission: RE | Admit: 2014-01-18 | Discharge: 2014-01-18 | Disposition: A | Discharge status: Home / Self Care | Payer: Medicare Other | Source: Ambulatory Visit | Attending: Internal Medicine | Admitting: Internal Medicine

## 2014-01-18 ENCOUNTER — Ambulatory Visit (INDEPENDENT_AMBULATORY_CARE_PROVIDER_SITE_OTHER): Payer: Medicare Other

## 2014-01-18 VITALS — BP 120/70 | HR 65 | Temp 99.3°F | Wt 144.0 lb

## 2014-01-18 DIAGNOSIS — M25519 Pain in unspecified shoulder: Secondary | ICD-10-CM

## 2014-01-18 DIAGNOSIS — M720 Palmar fascial fibromatosis [Dupuytren]: Secondary | ICD-10-CM | POA: Insufficient documentation

## 2014-01-18 DIAGNOSIS — M79609 Pain in unspecified limb: Secondary | ICD-10-CM | POA: Diagnosis not present

## 2014-01-18 DIAGNOSIS — M79601 Pain in right arm: Secondary | ICD-10-CM

## 2014-01-18 DIAGNOSIS — J309 Allergic rhinitis, unspecified: Secondary | ICD-10-CM | POA: Diagnosis not present

## 2014-01-18 DIAGNOSIS — M25511 Pain in right shoulder: Secondary | ICD-10-CM

## 2014-01-18 NOTE — Progress Notes (Signed)
Patient ID: [name], female   DOB: 17-Jul-1940, 73 y.o.   MRN: [id number]    Chief Complaint  Patient presents with  . Medical Management of Chronic Issues    1 week follow-up, seen on 01/05/14 for arm pain, pain is worse   Allergies  Allergen Reactions  . Ace Inhibitors   . Amoxicillin     REACTION: unknown? pain in right kidney  . Benicar Hct [Olmesartan Medoxomil-Hctz]     Extreme weakness  . Budesonide-Formoterol Fumarate     Causes tremors and numbness  . Chlorzoxazone [Chlorzoxazone]   . Citalopram Hydrobromide     REACTION: hives  . Citalopram Hydrobromide   . Clonidine Derivatives   . Droperidol     REACTION: hives  . Flexeril [Cyclobenzaprine Hcl]   . Ketorolac Tromethamine     REACTION: hives  . Ketorolac Tromethamine   . Metoclopramide Hcl   . Mometasone Furo-Formoterol Fum     Causes sore throat, blurred vision and unable to sleep--started on med 09-17-10  . Morphine     REACTION: hives and itching  . Olmesartan Medoxomil     REACTION: fatique   HPI 73 y/o female patient is here for follow up on her right arm pain. She now complaints of pain in her right shoulder as well. Pain is diffuse, on and off and she has feeling of fatigue in the arm. She also has pain her right palm on and off for 3 months now with knots in her palm which hurts her. Denies any pain in wrist. No pain in her digits and no limitation in working with her hand/palm.  denies any trauma.  No swelling or redness or warmth noted Naproxen has not helped her  ROS Denies fever or chills Denies aches/pain in any other muscle group or in any other extremities  Medication reviewed. See [facility]  Physical exam BP 120/70  Pulse 65  Temp(Src) 99.3 F (37.4 C) (Oral)  Wt 144 lb (65.318 kg)  SpO2 97%  General- elderly female in no acute distress Head- atraumatic, normocephalic Neck- no lymphadenopathy, no thyromegaly Cardiovascular- normal s1,s2, no murmurs Respiratory- bilateral clear to  auscultation, no wheeze, no rhonchi, no crackles Musculoskeletal- able to move all 4 extremities, normal ROM in right shoulder area, no bone tenderness, has muscle tenderness on palpation in right arm, right palm has 2 small palpable nodule and a palpable cord with some discomfort on palpation but no erythema noted, no discoloration, good radial pulses, no joint deformity. Left palm has a palpable cord and nodule as well Neurological- no focal deficit Psychiatry- alert and oriented   Assessment/plan  1. Pain in joint, shoulder region, right Will get xray to assess for OA changes.  - Ambulatory referral to Rheumatology - DG Shoulder Right; Future  2. Right arm pain With her ongoing diffuse arm pain but absence of cervical pain unclear of the cause. Inflammatory marker and muscle enzyme normal. Will refer to rheumatology for workup for possible PMR and fibromyalgia. Continue naproxen for now. Does not want any narcotics. - Ambulatory referral to Rheumatology  3. Dupuytren's contracture of both hands Concern with rapid progression of her contracture, will refer to rheumatology and further to hand specialist if indicated to evaluate for medical conservative management vs surgery - Ambulatory referral to Rheumatology

## 2014-01-24 ENCOUNTER — Ambulatory Visit (INDEPENDENT_AMBULATORY_CARE_PROVIDER_SITE_OTHER): Payer: Medicare Other

## 2014-01-24 DIAGNOSIS — J309 Allergic rhinitis, unspecified: Secondary | ICD-10-CM

## 2014-01-31 ENCOUNTER — Ambulatory Visit (INDEPENDENT_AMBULATORY_CARE_PROVIDER_SITE_OTHER): Payer: Medicare Other

## 2014-01-31 DIAGNOSIS — J309 Allergic rhinitis, unspecified: Secondary | ICD-10-CM

## 2014-02-07 ENCOUNTER — Ambulatory Visit: Payer: Medicare Other

## 2014-02-09 ENCOUNTER — Ambulatory Visit (INDEPENDENT_AMBULATORY_CARE_PROVIDER_SITE_OTHER): Payer: Medicare Other

## 2014-02-09 DIAGNOSIS — Z23 Encounter for immunization: Secondary | ICD-10-CM | POA: Diagnosis not present

## 2014-02-09 DIAGNOSIS — J309 Allergic rhinitis, unspecified: Secondary | ICD-10-CM | POA: Diagnosis not present

## 2014-02-14 ENCOUNTER — Ambulatory Visit (INDEPENDENT_AMBULATORY_CARE_PROVIDER_SITE_OTHER): Payer: Medicare Other

## 2014-02-14 DIAGNOSIS — J309 Allergic rhinitis, unspecified: Secondary | ICD-10-CM | POA: Diagnosis not present

## 2014-02-21 ENCOUNTER — Encounter: Payer: Self-pay | Admitting: Internal Medicine

## 2014-02-21 ENCOUNTER — Ambulatory Visit: Payer: Medicare Other | Admitting: Internal Medicine

## 2014-02-21 ENCOUNTER — Ambulatory Visit (INDEPENDENT_AMBULATORY_CARE_PROVIDER_SITE_OTHER): Payer: Medicare Other

## 2014-02-21 VITALS — BP 140/86 | HR 74 | Ht 63.0 in | Wt 146.8 lb

## 2014-02-21 DIAGNOSIS — J309 Allergic rhinitis, unspecified: Secondary | ICD-10-CM | POA: Diagnosis not present

## 2014-02-21 DIAGNOSIS — J45909 Unspecified asthma, uncomplicated: Secondary | ICD-10-CM

## 2014-02-21 MED ORDER — ALBUTEROL SULFATE HFA 108 (90 BASE) MCG/ACT IN AERS
2.0000 | INHALATION_SPRAY | RESPIRATORY_TRACT | Status: DC | PRN
Start: 2014-02-21 — End: 2016-02-13

## 2014-02-21 MED ORDER — FLUTICASONE PROPIONATE 50 MCG/ACT NA SUSP: NASAL | Status: DC

## 2014-02-21 MED ORDER — FLUTICASONE-SALMETEROL 250-50 MCG/DOSE IN AEPB: INHALATION_SPRAY | RESPIRATORY_TRACT | Status: DC

## 2014-02-21 MED ORDER — AZELASTINE-FLUTICASONE 137-50 MCG/ACT NA SUSP: NASAL | Status: DC

## 2014-02-21 NOTE — Patient Instructions (Signed)
We will advance the strength of your allergy shots to 1;10 next time the vaccine is ordered  Refills sent to drug store

## 2014-02-21 NOTE — Progress Notes (Signed)
01/16/12- 03/04/11- 39 yoF former smoker referred courtesy of Dr [name] for allergy evaluation. She says for most of her adult life she has had seasonal rhinitis. Allergy skin testing remotely by [place] led to allergy vaccine several decades ago. She cannot list all of the medications she has felt intolerant to. Some of them may have been associated with urticaria and itching. Food intolerance includes peanut butter/nausea, ice cream/diarrhea. Insect stings, mainly mosquitoes, caused local swelling and itching.  Since July 2012 she has had at least 4 "attacks" dizziness, frontal headache particularly lying down at night, deep cough and sneeze. She had seen Dr. Ovid Curd and our nurse practitioner. There is no lasting benefit from antibiotics. CT scan of the sinuses was negative. Between "attacks" she feels well. Triggers seem to include strong smells, wet leaves and rainy weather. Loud music makes her dizzy causes headache and discomfort around the eyes. She has had no ENT surgery and no asthma. She has had a number of emergency room trips for "allergy attack". She lives in an apartment with central air conditioning, wall-to-wall carpet, no mold and no pets.  04/09/11- 75 yoF former smoker followed for allergic rhinitis, asthma, food intolerance, multiple medication intolerance. In the last day or so she has been aware of some cough with frontal headache but she has had a sense of dizziness with photophobia and sensitivity to sound probably for 2 or 3 weeks. She blames her discomfort on "allergy". I suggested  head congestion, possibly some migraine, dry heat and maybe a viral infection could explain her symptoms better. She has had intermittent epistaxis from the left nostril. Aware of reflux mostly controlled with Nexium. Allergy profile 03/04/2011-IgE 13.5, no elevation of specific antibody levels on this panel. ESR-normal.  07/24/11-  52 yoF former smoker followed for allergic rhinitis, asthma, food  intolerance, multiple medication intolerance. Blames "allergy" for recurrent episodes of bronchitis through the winter. Asks if proximity to her wood kitchen cabinets could cause this. Told her no, and educated allergic vs viral. PFT 04/18/11- reviewed- Normal spirometry flows and lung volumes. Reduced DLCO 67 Allergy skin testing- Positive intradermal reactions to weed and tree pollens, dust mite, mold.  10/15/11-   28 yoF former smoker followed for allergic rhinitis, asthma, food intolerance, multiple medication intolerance. Feels like allergy vaccine is working well. Continues building vaccine now at 1:500. Spring and fall are usually her worst seasons and she has done well this spring.  01/16/12- 73 yoF former smoker followed for allergic rhinitis, asthma, food intolerance, multiple medication intolerance. Recent flare up of allergies-change in weather; still on vaccine and feels its working well for her. Blames rain for wheezy bronchitis and chest congestion. Says she is "really pleased" with her allergy vaccine now at 1:50 [place]. If she can control rhinitis she thinks she can prevent flareups of her asthmatic bronchitis. Wants to wait on flu vaccine.  08/07/12- 13 yoF former smoker followed for allergic rhinitis, asthma, food intolerance, multiple medication intolerance. FOLLOWS FOR: doing so much better on vaccine-"feels like I have my life back again" Continues allergy vaccine 1:50 GH with no problems. She remains outspoken about the improvement she credits it with giving her. Occasional sneeze. Zyrtec at bedtime. No asthma at all.  01/06/14- 73 yoF former smoker followed for allergic rhinitis, asthma, food intolerance, multiple medication intolerance. ACUTE VISIT: having allergy flare up(head pain, hoarseness, tired,ear aches, and mild sore throat.)-lasts about 2 days after getting allergy injection on Monday then this starts on Saturday.  02/21/14- 18 yoF  former smoker followed for allergic  rhinitis, asthma, food intolerance, multiple medication intolerance. FOLLOWS BHG:RJWBD on allergy vaccine 1:   GH and doing well with the combination of that the the nasal spray on the weekends.     ROS-see HPI Constitutional:   No-   weight loss, night sweats, fevers, chills, fatigue, lassitude. HEENT:   No- headaches, difficulty swallowing, tooth/dental problems, sore throat,       +Minimal sneezing, no-itching, ear ache, nasal congestion, post nasal drip,  CV:  No-   chest pain, orthopnea, PND, swelling in lower extremities, anasarca, dizziness, palpitations Resp: No-   shortness of breath with exertion or at rest.              No-   productive cough,  non-productive cough,  No- coughing up of blood.              No-   change in color of mucus.  +minimal recent wheezing.   Skin: No-   rash or lesions. GI:  No-   heartburn, indigestion, abdominal pain, nausea, vomiting,  GU: MS:  No-   joint pain or swelling.   Neuro-     nothing unusual Psych:  No- change in mood or affect. No depression or anxiety.  No memory loss.  OBJ General- Alert, Oriented, Affect-appropriate, Distress- none acute. Pleasant and talkative, over weight Skin- rash-none, lesions- none, excoriation- none Lymphadenopathy- none Head- atraumatic            Eyes- Gross vision intact, PERRLA, conjunctivae clear secretions            Ears- Hearing, canals-normal            Nose- no-mucus bridging, no-Septal dev, polyps, erosion, perforation             Throat- Mallampati II , mucosa red , drainage- none, tonsils- atrophic Neck- flexible , trachea midline, no stridor , thyroid nl, carotid no bruit Chest - symmetrical excursion , unlabored           Heart/CV- RRR , no murmur , no gallop  , no rub, nl s1 s2                           - JVD- none , edema- none, stasis changes- none, varices- none           Lung- +trace inspiratory wheeze, cough-none, dullness-none, rub- none           Chest wall-  Abd- Br/ Gen/ Rectal-  Not done, not indicated Extrem- cyanosis- none, clubbing, none, atrophy- none, strength- nl Neuro- grossly intact to observation

## 2014-02-24 ENCOUNTER — Other Ambulatory Visit: Payer: Self-pay | Admitting: Pulmonary Disease

## 2014-02-24 ENCOUNTER — Telehealth: Payer: Self-pay | Admitting: Internal Medicine

## 2014-02-24 NOTE — Telephone Encounter (Signed)
lmomtcb x1 

## 2014-02-25 MED ORDER — AZELASTINE HCL 0.1 % NA SOLN: 1.0000 | Freq: Every day | NASAL | Status: DC

## 2014-02-25 NOTE — Telephone Encounter (Signed)
Spoke with the pt  She is asking for sample of advair 250/50- 1 sample from A side given  She states that she likes the [place], but ins does not cover  She already has flonase, so I have called in [place] so that she can take the 2 together to make the dymista  Nothing further needed per pt

## 2014-02-28 ENCOUNTER — Ambulatory Visit (INDEPENDENT_AMBULATORY_CARE_PROVIDER_SITE_OTHER): Payer: Medicare Other

## 2014-02-28 DIAGNOSIS — J309 Allergic rhinitis, unspecified: Secondary | ICD-10-CM

## 2014-03-01 ENCOUNTER — Encounter: Payer: Self-pay | Admitting: Gastroenterology

## 2014-03-01 ENCOUNTER — Telehealth: Payer: Self-pay | Admitting: Internal Medicine

## 2014-03-01 NOTE — Telephone Encounter (Signed)
Called and spoke to pt. Informed pt to take both flonase and astelin in conjunction because the dymista is not convered. Pt verbalized understanding and denied any further questions or concerns at this time.

## 2014-03-07 ENCOUNTER — Ambulatory Visit (INDEPENDENT_AMBULATORY_CARE_PROVIDER_SITE_OTHER): Payer: Medicare Other

## 2014-03-07 DIAGNOSIS — J309 Allergic rhinitis, unspecified: Secondary | ICD-10-CM

## 2014-03-09 DIAGNOSIS — M79641 Pain in right hand: Secondary | ICD-10-CM | POA: Diagnosis not present

## 2014-03-09 DIAGNOSIS — Z79899 Other long term (current) drug therapy: Secondary | ICD-10-CM | POA: Diagnosis not present

## 2014-03-09 DIAGNOSIS — M2550 Pain in unspecified joint: Secondary | ICD-10-CM | POA: Diagnosis not present

## 2014-03-09 DIAGNOSIS — M25511 Pain in right shoulder: Secondary | ICD-10-CM | POA: Diagnosis not present

## 2014-03-09 DIAGNOSIS — M6530 Trigger finger, unspecified finger: Secondary | ICD-10-CM | POA: Diagnosis not present

## 2014-03-09 DIAGNOSIS — M25561 Pain in right knee: Secondary | ICD-10-CM | POA: Diagnosis not present

## 2014-03-09 DIAGNOSIS — E559 Vitamin D deficiency, unspecified: Secondary | ICD-10-CM | POA: Diagnosis not present

## 2014-03-14 ENCOUNTER — Ambulatory Visit (INDEPENDENT_AMBULATORY_CARE_PROVIDER_SITE_OTHER): Payer: Medicare Other

## 2014-03-14 ENCOUNTER — Encounter: Payer: Self-pay | Admitting: Internal Medicine

## 2014-03-14 DIAGNOSIS — J309 Allergic rhinitis, unspecified: Secondary | ICD-10-CM

## 2014-03-21 ENCOUNTER — Ambulatory Visit (INDEPENDENT_AMBULATORY_CARE_PROVIDER_SITE_OTHER): Payer: Medicare Other

## 2014-03-21 DIAGNOSIS — J309 Allergic rhinitis, unspecified: Secondary | ICD-10-CM | POA: Diagnosis not present

## 2014-03-25 ENCOUNTER — Other Ambulatory Visit: Payer: Medicare Other

## 2014-03-25 ENCOUNTER — Encounter: Payer: Self-pay | Admitting: Internal Medicine

## 2014-03-25 ENCOUNTER — Encounter: Payer: Self-pay | Admitting: *Deleted

## 2014-03-25 DIAGNOSIS — E785 Hyperlipidemia, unspecified: Secondary | ICD-10-CM

## 2014-03-26 LAB — LIPID PANEL
Chol/HDL Ratio: 3.3 ratio units (ref 0.0–4.4)
Cholesterol, Total: 224 mg/dL — ABNORMAL HIGH (ref 100–199)
HDL: 67 mg/dL (ref >39)
LDL Calculated: 142 mg/dL — ABNORMAL HIGH (ref 0–99)
Triglycerides: 73 mg/dL (ref 0–149)
VLDL Cholesterol Cal: 15 mg/dL (ref 5–40)

## 2014-03-28 ENCOUNTER — Ambulatory Visit (INDEPENDENT_AMBULATORY_CARE_PROVIDER_SITE_OTHER): Payer: Medicare Other

## 2014-03-28 DIAGNOSIS — J309 Allergic rhinitis, unspecified: Secondary | ICD-10-CM | POA: Diagnosis not present

## 2014-03-29 ENCOUNTER — Ambulatory Visit: Payer: Medicare Other | Admitting: Internal Medicine

## 2014-03-30 ENCOUNTER — Encounter: Payer: Self-pay | Admitting: Internal Medicine

## 2014-03-30 ENCOUNTER — Ambulatory Visit (INDEPENDENT_AMBULATORY_CARE_PROVIDER_SITE_OTHER): Payer: Medicare Other | Admitting: Internal Medicine

## 2014-03-30 VITALS — BP 130/60 | HR 73 | Temp 98.5°F | Ht 63.0 in | Wt 145.4 lb

## 2014-03-30 DIAGNOSIS — E785 Hyperlipidemia, unspecified: Secondary | ICD-10-CM | POA: Diagnosis not present

## 2014-03-30 DIAGNOSIS — R42 Dizziness and giddiness: Secondary | ICD-10-CM

## 2014-03-30 DIAGNOSIS — J309 Allergic rhinitis, unspecified: Secondary | ICD-10-CM

## 2014-03-30 DIAGNOSIS — J4520 Mild intermittent asthma, uncomplicated: Secondary | ICD-10-CM

## 2014-03-30 DIAGNOSIS — I10 Essential (primary) hypertension: Secondary | ICD-10-CM | POA: Diagnosis not present

## 2014-03-30 DIAGNOSIS — K219 Gastro-esophageal reflux disease without esophagitis: Secondary | ICD-10-CM | POA: Diagnosis not present

## 2014-03-30 NOTE — Progress Notes (Signed)
Patient ID: [name], female   DOB: 05-16-1940, 73 y.o.   MRN: [id number]    Chief Complaint  Patient presents with  . Follow-up    Discuss labs (copy printed)   Allergies  Allergen Reactions  . Ace Inhibitors   . Amoxicillin     REACTION: unknown? pain in right kidney  . Benicar Hct [Olmesartan Medoxomil-Hctz]     Extreme weakness  . Budesonide-Formoterol Fumarate     Causes tremors and numbness  . Chlorzoxazone [Chlorzoxazone]   . Citalopram Hydrobromide     REACTION: hives  . Citalopram Hydrobromide   . Clonidine Derivatives   . Droperidol     REACTION: hives  . Flexeril [Cyclobenzaprine Hcl]   . Ketorolac Tromethamine     REACTION: hives  . Ketorolac Tromethamine   . Metoclopramide Hcl   . Mometasone Furo-Formoterol Fum     Causes sore throat, blurred vision and unable to sleep--started on med 09-17-10  . Morphine     REACTION: hives and itching  . Olmesartan Medoxomil     REACTION: fatique   HPI 73 y/o female pt is here for RV. She denies any concerns today. She has pmh of HTN, reflux disease, vertigo, allergic rhinitis and asthma. She denies any concern today.  Review of Systems  Constitutional: Negative for fever, chills, weight loss, malaise/fatigue and diaphoresis.  HENT: Negative for congestion, hearing loss and sore throat.  has runny nose from her allergies Eyes: Negative for blurred vision, double vision and discharge.  Respiratory: Negative for cough, sputum production, shortness of breath and wheezing.  has chronic bronchitis Cardiovascular: Negative for chest pain, palpitations, orthopnea and leg swelling.  Gastrointestinal: Negative for heartburn, nausea, vomiting, abdominal pain, diarrhea and constipation.  Genitourinary: Negative for dysuria, urgency, frequency and flank pain.  Musculoskeletal: Negative for back pain, falls Skin: Negative for itching and rash.  Neurological: Negative for tingling, focal weakness and headaches. has ocassional  dizziness and uses meclizine as needed Psychiatric/Behavioral: Negative for depression and insomnia. The patient is not nervous/anxious.  has noted some short term memory loss  Past Medical History  Diagnosis Date  . ALLERGIC RHINITIS   . Hypertension   . Asthma   . Hypercholesterolemia     borderline  . GERD (gastroesophageal reflux disease)   . Diverticulosis of colon   . IBS (irritable bowel syndrome)   . Colon polyp   . History of nephrolithiasis   . Osteoarthritis   . Fibromyalgia   . Vitamin D deficiency   . Postconcussion syndrome   . Cervical strain, acute   . Dizziness   . Anxiety   . Personal history of allergy to unspecified medicinal agent   . Blood type O+    Current Outpatient Prescriptions on File Prior to Visit  Medication Sig Dispense Refill  . acetaminophen (TYLENOL) 500 MG tablet Take 500 mg by mouth every 6 (six) hours as needed (for headache as needed).     Marland Kitchen albuterol (PROVENTIL HFA;VENTOLIN HFA) 108 (90 BASE) MCG/ACT inhaler Inhale 2 puffs into the lungs every 4 (four) hours as needed for wheezing or shortness of breath. RESCUE 1 Inhaler prn  . amLODipine (NORVASC) 5 MG tablet TAKE 1 TABLET BY MOUTH EVERY DAY for Blood pressure    . aspirin 81 MG EC tablet Take 81 mg by mouth daily.      Marland Kitchen azelastine (ASTELIN) 0.1 % nasal spray Place 1-2 sprays into both nostrils at bedtime. Use in each nostril as directed 30 mL 12  .  Cetirizine HCl (ZYRTEC ALLERGY) 10 MG CAPS Take 1 capsule by mouth daily.      . Cholecalciferol (VITAMIN D3) 1000 UNITS CAPS Take by mouth daily.      . fluticasone (FLONASE) 50 MCG/ACT nasal spray USE 1-2 SPRAYS IN EACH NOSTRIL TWICE A DAY 16 g prn  . Fluticasone-Salmeterol (ADVAIR DISKUS) 250-50 MCG/DOSE AEPB 1 puff then rinse mouth, twice daily 60 each prn  . hydrochlorothiazide (MICROZIDE) 12.5 MG capsule TAKE ONE CAPSULE BY MOUTH EVERY DAY 30 capsule 6  . Injection Device MISC by Does not apply route. Allergy injection once every Monday,  Administered by Dr.Young    . KLOR-CON M20 20 MEQ tablet TAKE 1 TABLET BY MOUTH DAILY 30 tablet 6  . meclizine (ANTIVERT) 25 MG tablet TAKE 1 TABLET 3 TIMES A DAY AS NEEDED FOR DIZZINESS/NAUSEA 30 tablet 1  . Multiple Vitamins-Minerals (CENTRUM SILVER PO) Take by mouth daily.      . Omega-3 Fatty Acids (FISH OIL MAXIMUM STRENGTH) 1200 MG CAPS Take by mouth. 2 by mouth daily    . pantoprazole (PROTONIX) 40 MG tablet Take 1 tablet (40 mg total) by mouth daily. 30 tablet 3  . Propylene Glycol (SYSTANE BALANCE) 0.6 % SOLN Place 2 drops into both eyes 2 (two) times daily.      Marland Kitchen SALINE NASAL SPRAY NA Place 88 mcg into the nose 2 (two) times daily.     No current facility-administered medications on file prior to visit.    Physical exam BP 130/60 mmHg  Pulse 73  Temp(Src) 98.5 F (36.9 C) (Oral)  Ht 5' 3"  (1.6 m)  Wt 145 lb 6.4 oz (65.953 kg)  BMI 25.76 kg/m2  SpO2 98%  Wt Readings from Last 3 Encounters:  03/30/14 145 lb 6.4 oz (65.953 kg)  02/21/14 146 lb 12.8 oz (66.588 kg)  01/18/14 144 lb (65.318 kg)   Constitutional: She is oriented to person, place, and time and well-developed, well-nourished, and in no distress.  HENT:   Head: Normocephalic and atraumatic.  Nose: Nose normal.   Mouth/Throat: Oropharynx is clear and moist. No oropharyngeal exudate.  Eyes: Conjunctivae and EOM are normal. Pupils are equal, round, and reactive to light.  Neck: Normal range of motion. Neck supple. No JVD present. No thyromegaly present.  Cardiovascular: Normal rate, regular rhythm, normal heart sounds and intact distal pulses.   Pulmonary/Chest: Effort normal and breath sounds normal. No respiratory distress. She has no wheezes. She has no rales. She exhibits no tenderness.  Abdominal: Soft. Bowel sounds are normal. She exhibits no distension and no mass. There is no tenderness. There is no rebound and no guarding.  Musculoskeletal: Normal range of motion. She exhibits no edema and no tenderness.    Lymphadenopathy: She has no cervical adenopathy.  Neurological: She is alert and oriented to person, place, and time.  Skin: Skin is warm and dry.  Psychiatric: Affect normal. Her mood appears normal   Labs CBC Latest Ref Rng 08/25/2013 07/02/2012 03/04/2011  WBC 3.4 - 10.8 x10E3/uL 6.6 10.2 10.0  Hemoglobin 11.1 - 15.9 g/dL 14.0 15.2(H) 14.7  Hematocrit 34.0 - 46.6 % 42.1 44.9 43.0  Platelets 150 - 379 x10E3/uL 299 307.0 318.0   CMP     Component Value Date/Time   NA 146* 08/25/2013 1027   NA 140 07/02/2012 1100   K 5.2 08/25/2013 1027   CL 103 08/25/2013 1027   CO2 28 08/25/2013 1027   GLUCOSE 95 08/25/2013 1027   GLUCOSE 103* 07/02/2012 1100  BUN 14 08/25/2013 1027   BUN 22 07/02/2012 1100   CREATININE 0.65 08/25/2013 1027   CALCIUM 10.1 08/25/2013 1027   PROT 6.9 08/25/2013 1027   PROT 7.1 07/02/2012 1100   ALBUMIN 4.1 07/02/2012 1100   AST 23 08/25/2013 1027   ALT 16 08/25/2013 1027   ALKPHOS 101 08/25/2013 1027   BILITOT 0.8 08/25/2013 1027   GFRNONAA 88 08/25/2013 1027   GFRAA 101 08/25/2013 1027   Lipid Panel     Component Value Date/Time   CHOL 182 01/01/2013 1035   TRIG 73 03/25/2014 1043   HDL 67 03/25/2014 1043   HDL 48.50 01/01/2013 1035   CHOLHDL 3.3 03/25/2014 1043   CHOLHDL 4 01/01/2013 1035   VLDL 24.0 01/01/2013 1035   LDLCALC 142* 03/25/2014 1043   LDLCALC 110* 01/01/2013 1035   LDLDIRECT 102.5 07/02/2012 1100   Assessment/plan  1. Essential hypertension Stable bp reading. Continue norvasc 5 mg daily and hctz 12.5 mg daily and aspirin for now. Continue kcl supplement - CMP; Future - CBC with Differential; Future - TSH; Future  2. Asthma, allergic, mild intermittent, uncomplicated Stable, continue prn albuterol with her advair for now  3. Allergic rhinitis, unspecified allergic rhinitis type Stable, continue flonase and zyrtec  4. Gastroesophageal reflux disease, esophagitis presence not specified Stable, continue protonix 40 mg  daily  5. DIZZINESS Stable on prn meclizine and has not required much recently. monitor  6. Dyslipidemia Continue omega 3., pt does not want statin as she did not tolerate it well in the past. She does not want to try any other medication either for now. Diet counselling provided - Lipid Panel; Future - CBC with Differential; Future - TSH; Future

## 2014-04-04 ENCOUNTER — Ambulatory Visit (INDEPENDENT_AMBULATORY_CARE_PROVIDER_SITE_OTHER): Payer: Medicare Other

## 2014-04-04 DIAGNOSIS — J309 Allergic rhinitis, unspecified: Secondary | ICD-10-CM

## 2014-04-11 ENCOUNTER — Ambulatory Visit (INDEPENDENT_AMBULATORY_CARE_PROVIDER_SITE_OTHER): Payer: Medicare Other

## 2014-04-11 ENCOUNTER — Other Ambulatory Visit: Payer: Self-pay | Admitting: Dermatology

## 2014-04-11 DIAGNOSIS — Z85828 Personal history of other malignant neoplasm of skin: Secondary | ICD-10-CM | POA: Diagnosis not present

## 2014-04-11 DIAGNOSIS — D485 Neoplasm of uncertain behavior of skin: Secondary | ICD-10-CM | POA: Diagnosis not present

## 2014-04-11 DIAGNOSIS — J309 Allergic rhinitis, unspecified: Secondary | ICD-10-CM

## 2014-04-11 DIAGNOSIS — D0439 Carcinoma in situ of skin of other parts of face: Secondary | ICD-10-CM | POA: Diagnosis not present

## 2014-04-14 ENCOUNTER — Ambulatory Visit (INDEPENDENT_AMBULATORY_CARE_PROVIDER_SITE_OTHER): Payer: Medicare Other

## 2014-04-14 DIAGNOSIS — J309 Allergic rhinitis, unspecified: Secondary | ICD-10-CM | POA: Diagnosis not present

## 2014-04-15 ENCOUNTER — Other Ambulatory Visit: Payer: Self-pay | Admitting: Pulmonary Disease

## 2014-04-18 ENCOUNTER — Ambulatory Visit (INDEPENDENT_AMBULATORY_CARE_PROVIDER_SITE_OTHER): Payer: Medicare Other

## 2014-04-18 DIAGNOSIS — J309 Allergic rhinitis, unspecified: Secondary | ICD-10-CM | POA: Diagnosis not present

## 2014-04-18 DIAGNOSIS — Z961 Presence of intraocular lens: Secondary | ICD-10-CM | POA: Diagnosis not present

## 2014-04-22 ENCOUNTER — Other Ambulatory Visit: Payer: Self-pay | Admitting: Pulmonary Disease

## 2014-04-25 ENCOUNTER — Ambulatory Visit (INDEPENDENT_AMBULATORY_CARE_PROVIDER_SITE_OTHER): Payer: Medicare Other

## 2014-04-25 DIAGNOSIS — J309 Allergic rhinitis, unspecified: Secondary | ICD-10-CM

## 2014-04-26 ENCOUNTER — Other Ambulatory Visit: Payer: Self-pay | Admitting: *Deleted

## 2014-04-26 MED ORDER — AMLODIPINE BESYLATE 5 MG PO TABS: ORAL_TABLET | ORAL | Status: DC

## 2014-04-26 NOTE — Telephone Encounter (Signed)
Patient requested to be faxed to pharmacy

## 2014-05-02 ENCOUNTER — Ambulatory Visit (INDEPENDENT_AMBULATORY_CARE_PROVIDER_SITE_OTHER): Payer: Medicare Other

## 2014-05-02 DIAGNOSIS — J309 Allergic rhinitis, unspecified: Secondary | ICD-10-CM

## 2014-05-09 ENCOUNTER — Ambulatory Visit (INDEPENDENT_AMBULATORY_CARE_PROVIDER_SITE_OTHER): Payer: Medicare Other

## 2014-05-09 DIAGNOSIS — J309 Allergic rhinitis, unspecified: Secondary | ICD-10-CM | POA: Diagnosis not present

## 2014-05-12 ENCOUNTER — Emergency Department (HOSPITAL_COMMUNITY)
Admission: EM | Admit: 2014-05-12 | Discharge: 2014-05-12 | Disposition: A | Discharge status: Home / Self Care | Payer: Medicare Other | Attending: Emergency Medicine | Admitting: Emergency Medicine

## 2014-05-12 ENCOUNTER — Emergency Department (INDEPENDENT_AMBULATORY_CARE_PROVIDER_SITE_OTHER)
Admission: EM | Admit: 2014-05-12 | Discharge: 2014-05-12 | Disposition: A | Discharge status: Nursing Home (Includes ICF) | Payer: Medicare Other | Source: Home / Self Care | Attending: Emergency Medicine | Admitting: Emergency Medicine

## 2014-05-12 ENCOUNTER — Encounter (HOSPITAL_COMMUNITY): Payer: Self-pay | Admitting: *Deleted

## 2014-05-12 ENCOUNTER — Encounter (HOSPITAL_COMMUNITY): Payer: Self-pay | Admitting: Emergency Medicine

## 2014-05-12 DIAGNOSIS — Z7951 Long term (current) use of inhaled steroids: Secondary | ICD-10-CM | POA: Diagnosis not present

## 2014-05-12 DIAGNOSIS — M79672 Pain in left foot: Secondary | ICD-10-CM | POA: Insufficient documentation

## 2014-05-12 DIAGNOSIS — I10 Essential (primary) hypertension: Secondary | ICD-10-CM | POA: Insufficient documentation

## 2014-05-12 DIAGNOSIS — R202 Paresthesia of skin: Secondary | ICD-10-CM

## 2014-05-12 DIAGNOSIS — Z8659 Personal history of other mental and behavioral disorders: Secondary | ICD-10-CM | POA: Insufficient documentation

## 2014-05-12 DIAGNOSIS — Z880 Allergy status to penicillin: Secondary | ICD-10-CM | POA: Insufficient documentation

## 2014-05-12 DIAGNOSIS — M1990 Unspecified osteoarthritis, unspecified site: Secondary | ICD-10-CM | POA: Insufficient documentation

## 2014-05-12 DIAGNOSIS — K219 Gastro-esophageal reflux disease without esophagitis: Secondary | ICD-10-CM | POA: Diagnosis not present

## 2014-05-12 DIAGNOSIS — J45909 Unspecified asthma, uncomplicated: Secondary | ICD-10-CM | POA: Diagnosis not present

## 2014-05-12 DIAGNOSIS — M797 Fibromyalgia: Secondary | ICD-10-CM | POA: Insufficient documentation

## 2014-05-12 DIAGNOSIS — M79671 Pain in right foot: Secondary | ICD-10-CM | POA: Insufficient documentation

## 2014-05-12 DIAGNOSIS — Z7982 Long term (current) use of aspirin: Secondary | ICD-10-CM | POA: Insufficient documentation

## 2014-05-12 DIAGNOSIS — Z86010 Personal history of colonic polyps: Secondary | ICD-10-CM | POA: Insufficient documentation

## 2014-05-12 DIAGNOSIS — Z79899 Other long term (current) drug therapy: Secondary | ICD-10-CM | POA: Insufficient documentation

## 2014-05-12 DIAGNOSIS — K589 Irritable bowel syndrome without diarrhea: Secondary | ICD-10-CM | POA: Diagnosis not present

## 2014-05-12 DIAGNOSIS — Z87891 Personal history of nicotine dependence: Secondary | ICD-10-CM | POA: Insufficient documentation

## 2014-05-12 DIAGNOSIS — R200 Anesthesia of skin: Secondary | ICD-10-CM | POA: Diagnosis present

## 2014-05-12 LAB — POCT I-STAT, CHEM 8
BUN: 21 mg/dL (ref 6–23)
CHLORIDE: 101 meq/L (ref 96–112)
CREATININE: 0.6 mg/dL (ref 0.50–1.10)
Calcium, Ion: 1.19 mmol/L (ref 1.13–1.30)
Glucose, Bld: 107 mg/dL — ABNORMAL HIGH (ref 70–99)
HCT: 45 % (ref 36.0–46.0)
Hemoglobin: 15.3 g/dL — ABNORMAL HIGH (ref 12.0–15.0)
POTASSIUM: 3.4 mmol/L — AB (ref 3.5–5.1)
SODIUM: 140 mmol/L (ref 135–145)
TCO2: 25 mmol/L (ref 0–100)

## 2014-05-12 NOTE — ED Notes (Signed)
NAD at this time.

## 2014-05-12 NOTE — ED Notes (Signed)
C/o bilateral foot/legs numbness and tingly onset 2 weeks; getting worse Reports she had a cortisone inj on 12/17 Steady gait; NAD Alert, no signs of acute distress.

## 2014-05-12 NOTE — Discharge Instructions (Signed)
Paresthesia Paresthesia is an abnormal burning or prickling sensation. This sensation is generally felt in the hands, arms, legs, or feet. However, it may occur in any part of the body. It is usually not painful. The feeling may be described as:  Tingling or numbness.  "Pins and needles."  Skin crawling.  Buzzing.  Limbs "falling asleep."  Itching. Most people experience temporary (transient) paresthesia at some time in their lives. CAUSES  Paresthesia may occur when you breathe too quickly (hyperventilation). It can also occur without any apparent cause. Commonly, paresthesia occurs when pressure is placed on a nerve. The feeling quickly goes away once the pressure is removed. For some people, however, paresthesia is a long-lasting (chronic) condition caused by an underlying disorder. The underlying disorder may be:  A traumatic, direct injury to nerves. Examples include a:  Broken (fractured) neck.  Fractured skull.  A disorder affecting the brain and spinal cord (central nervous system). Examples include:  Transverse myelitis.  Encephalitis.  Transient ischemic attack.  Multiple sclerosis.  Stroke.  Tumor or blood vessel problems, such as an arteriovenous malformation pressing against the brain or spinal cord.  A condition that damages the peripheral nerves (peripheral neuropathy). Peripheral nerves are not part of the brain and spinal cord. These conditions include:  Diabetes.  Peripheral vascular disease.  Nerve entrapment syndromes, such as carpal tunnel syndrome.  Shingles.  Hypothyroidism.  Vitamin B12 deficiencies.  Alcoholism.  Heavy metal poisoning (lead, arsenic).  Rheumatoid arthritis.  Systemic lupus erythematosus. DIAGNOSIS  Your caregiver will attempt to find the underlying cause of your paresthesia. Your caregiver may:  Take your medical history.  Perform a physical exam.  Order various lab tests.  Order imaging tests. TREATMENT    Treatment for paresthesia depends on the underlying cause. HOME CARE INSTRUCTIONS  Avoid drinking alcohol.  You may consider massage or acupuncture to help relieve your symptoms.  Keep all follow-up appointments as directed by your caregiver. SEEK IMMEDIATE MEDICAL CARE IF:   You feel weak.  You have trouble walking or moving.  You have problems with speech or vision.  You feel confused.  You cannot control your bladder or bowel movements.  You feel numbness after an injury.  You faint.  Your burning or prickling feeling gets worse when walking.  You have pain, cramps, or dizziness.  You develop a rash. MAKE SURE YOU:  Understand these instructions.  Will watch your condition.  Will get help right away if you are not doing well or get worse. Document Released: 04/19/2002 Document Revised: 07/22/2011 Document Reviewed: 01/18/2011 [facility] Patient Information 2015 [place], [place]. This information is not intended to replace advice given to you by your health care provider. Make sure you discuss any questions you have with your health care provider.

## 2014-05-12 NOTE — ED Notes (Signed)
Pt reports having a burning pain/numbness to bottom of bilateral feet x 1 week. Now reports its spreading up to her lower legs. Pt sent here from [facility] for further eval, wants to r/o guillain-barre.  Pt reports having steroid inj on 12/17. Ambulatory on arrival, no acute distress noted.

## 2014-05-12 NOTE — ED Provider Notes (Signed)
CSN: [id number]     Arrival date & time 05/12/14  1110 History   First MD Initiated Contact with Patient 05/12/14 1130     Chief Complaint  Patient presents with  . Numbness  . Foot Pain      HPI Patient presents to the emergency department complaining of bilateral lower extremity numbness and paresthesias over the past 2-3 days.  She states she's always had paresthesias in her feet and toes and this usually improves with ambulation.  This is been going on intermittently for years.  She states she's never really had the paresthesias and numbness in her lower extremities.  She denies weakness.  No difficulty walking or sitting up.  No difficulty ambulating.  She denies upper extremity symptoms.  She denies difficulty breathing or swallowing.  No chest pain or abdominal pain.  She denies significant pain in her lower extremities.  No recent injury or trauma.  No recent upper respiratory symptoms.  No recent illness.  Denies low back pain.   Past Medical History  Diagnosis Date  . ALLERGIC RHINITIS   . Hypertension   . Asthma   . Hypercholesterolemia     borderline  . GERD (gastroesophageal reflux disease)   . Diverticulosis of colon   . IBS (irritable bowel syndrome)   . Colon polyp   . History of nephrolithiasis   . Osteoarthritis   . Fibromyalgia   . Vitamin D deficiency   . Postconcussion syndrome   . Cervical strain, acute   . Dizziness   . Anxiety   . Personal history of allergy to unspecified medicinal agent   . Blood type O+    Past Surgical History  Procedure Laterality Date  . Cholecystectomy, laparoscopic  2004    for gallstone pancreatitis  . Thyroid surgery    . Knee surgery Right   . Knee surgery Left    Family History  Problem Relation Age of Onset  . Breast cancer Mother   . Alzheimer's disease Mother   . Heart disease Mother   . Ovarian cancer Paternal Grandmother   . Arthritis Other     Cousins   . COPD Brother   . COPD Cousin     Maternal side    . Heart attack Maternal Uncle   . Heart disease Sister   . Alcohol abuse Sister    History  Substance Use Topics  . Smoking status: Former Smoker -- 2.00 packs/day for 8 years    Types: Cigarettes    Quit date: 05/13/1962  . Smokeless tobacco: Never Used  . Alcohol Use: No   OB History    No data available     Review of Systems  All other systems reviewed and are negative.     Allergies  Ace inhibitors; Amoxicillin; Benicar hct; Budesonide-formoterol fumarate; Chlorzoxazone; Citalopram hydrobromide; Citalopram hydrobromide; Clonidine derivatives; Droperidol; Flexeril; Ketorolac tromethamine; Ketorolac tromethamine; Metoclopramide hcl; Mometasone furo-formoterol fum; Morphine; and Olmesartan medoxomil  Home Medications   Prior to Admission medications   Medication Sig Start Date End Date Taking? Authorizing Provider  acetaminophen (TYLENOL) 500 MG tablet Take 500 mg by mouth every 6 (six) hours as needed (for headache as needed).     Historical Provider, MD  albuterol (PROVENTIL HFA;VENTOLIN HFA) 108 (90 BASE) MCG/ACT inhaler Inhale 2 puffs into the lungs every 4 (four) hours as needed for wheezing or shortness of breath. RESCUE 02/21/14 03/29/16  Deneise Lever, MD  amLODipine (NORVASC) 5 MG tablet Take one tablet by mouth once  daily to control blood pressure 04/26/14   Lauree Chandler, NP  aspirin 81 MG EC tablet Take 81 mg by mouth daily.      Historical Provider, MD  azelastine (ASTELIN) 0.1 % nasal spray Place 1-2 sprays into both nostrils at bedtime. Use in each nostril as directed 02/25/14   Deneise Lever, MD  Cetirizine HCl (ZYRTEC ALLERGY) 10 MG CAPS Take 1 capsule by mouth daily.      Historical Provider, MD  Cholecalciferol (VITAMIN D3) 1000 UNITS CAPS Take by mouth daily.      Historical Provider, MD  fluticasone (FLONASE) 50 MCG/ACT nasal spray USE 1-2 SPRAYS IN EACH NOSTRIL TWICE A DAY 02/21/14   Deneise Lever, MD  Fluticasone-Salmeterol (ADVAIR DISKUS)  250-50 MCG/DOSE AEPB 1 puff then rinse mouth, twice daily 02/21/14   Deneise Lever, MD  hydrochlorothiazide (MICROZIDE) 12.5 MG capsule TAKE ONE CAPSULE BY MOUTH EVERY DAY    Noralee Space, MD  Injection Device MISC by Does not apply route. Allergy injection once every Monday, Administered by Dr.Young    Historical Provider, MD  KLOR-CON M20 20 MEQ tablet TAKE 1 TABLET BY MOUTH DAILY    Noralee Space, MD  meclizine (ANTIVERT) 25 MG tablet TAKE 1 TABLET 3 TIMES A DAY AS NEEDED FOR DIZZINESS/NAUSEA 08/20/12   Noralee Space, MD  Multiple Vitamins-Minerals (CENTRUM SILVER PO) Take by mouth daily.      Historical Provider, MD  Omega-3 Fatty Acids (FISH OIL MAXIMUM STRENGTH) 1200 MG CAPS Take by mouth. 2 by mouth daily    Historical Provider, MD  pantoprazole (PROTONIX) 40 MG tablet Take 1 tablet (40 mg total) by mouth daily. 01/12/14   Blanchie Serve, MD  Propylene Glycol (SYSTANE BALANCE) 0.6 % SOLN Place 2 drops into both eyes 2 (two) times daily.      Historical Provider, MD  SALINE NASAL SPRAY NA Place 88 mcg into the nose 2 (two) times daily.    Historical Provider, MD   BP 174/62 mmHg  Pulse 60  Temp(Src) 97.4 F (36.3 C) (Oral)  Resp 18  Ht 5' 2"  (1.575 m)  Wt 138 lb (62.596 kg)  BMI 25.23 kg/m2  SpO2 97% Physical Exam  Constitutional: She is oriented to person, place, and time. She appears well-developed and well-nourished. No distress.  HENT:  Head: Normocephalic and atraumatic.  Eyes: EOM are normal.  Neck: Normal range of motion.  Cardiovascular: Normal rate, regular rhythm and normal heart sounds.   Pulmonary/Chest: Effort normal and breath sounds normal.  Abdominal: Soft. She exhibits no distension. There is no tenderness.  Musculoskeletal: Normal range of motion.  Normal PT and DP pulses bilaterally.  No unilateral leg swelling.  No erythema of her lower extremities.  No overlying skin changes.  No clonus present.  2+ patellar reflexes bilaterally.  Difficult to obtain ankle  reflexes I suspect more secondary to complaints of the patient.  Neurological: She is alert and oriented to person, place, and time.  Skin: Skin is warm and dry.  Psychiatric: She has a normal mood and affect. Judgment normal.  Nursing note and vitals reviewed.   ED Course  Procedures (including critical care time) Labs Review Labs Reviewed - No data to display  Imaging Review No results found.   EKG Interpretation None      MDM   Final diagnoses:  Paresthesia of bilateral legs    I suspect this is more neuropathy and paresthesias.  My suspicion for Ethelene Hal is  low.  She was transferred from urgent care as a possible diagnosis in the differential.  At this time she is very well appearing.  She is a very functional 73 year old female.  I think she needs close follow-up with her primary care physician and to evaluation by neurologist.  I do not think this needs to be done acutely in the emergency department.  She has no weakness and difficulty ambulating.  I've given the patient strict return precautions such as increasing paresthesias above the level of the knee or development of weakness or difficulty ambulating.  Patient would prefer to go home at this time and follow-up as an outpatient.  She understands the potential for her symptoms to worsen and understands to return to the ER if this were to occur.  I've asked that she follow-up with her primary care physician on Monday.  Patient denies low back pain.    Hoy Morn, MD 05/12/14 [id number]

## 2014-05-12 NOTE — Discharge Instructions (Signed)
We have determined that your problem requires further evaluation in the emergency department.  We will take care of your transport there.  Once at the emergency department, you will be evaluated by a provider and they will order whatever treatment or tests they deem necessary.  We cannot guarantee that they will do any specific test or do any specific treatment.   Guillain-Barre Syndrome Guillain-Barr syndrome is a disorder in which the body's protection (immune) system attacks part of the nervous system. This syndrome is rare. [name] is called a syndrome rather than a disease because it is not clear that a specific disease-causing agent is involved. CAUSES   Usually Guillain-Barr occurs a few days or weeks after the patient has had symptoms of a viral infection:  Breathing (respiratory).  Stomach (gastrointestinal).  Sometimes, surgery or vaccinations will set off the syndrome.  The disorder can develop over the course of hours or days. Or it may take up to 3 to 4 weeks.  No one yet knows why Guillain-Barr strikes some people and not others. Nor do they know what sets the disease in motion.  What scientists do know is that the body's immune system begins to attack the body itself. This causes what is known as an autoimmune disease. SYMPTOMS  The first problems (symptoms) of this disorder include varying degrees of weakness or tingling sensations (feeling) in the legs. In many cases, the weakness and abnormal sensations spread to the arms and upper body. These symptoms may worsen until the muscles cannot be used at all. Then the patient is almost totally paralyzed. In these cases, the disorder is life-threatening. It is considered a medical emergency. The patient is often put on a respirator to assist with breathing. Most patients recover from even the most severe cases of this syndrome. But some continue to have some degree of weakness. DIAGNOSIS  [name] is called a syndrome  rather than a disease because it is not clear that a specific disease-causing agent is involved. Reflexes such as knee jerks are usually lost. Signals traveling along the nerve are slower. So a nerve conduction velocity (NCV) test can give caregivers clues to aid the diagnosis. This means they try to learn what is wrong. The cerebrospinal fluid (CSF) that bathes the spinal cord and brain contains more protein than usual. So a caregiver may decide to perform a spinal tap. TREATMENT  There is no known cure for this syndrome. But treatments can give some relief and speed up the recovery in most patients. There are also a number of ways to treat the complications of the syndrome. Currently, plasmapheresis and high-dose immunoglobulin therapy are used. Plasmapheresis seems to reduce the severity and length of the Guillain-Barr episode. In high-dose immunoglobulin therapy, caregivers give intravenous injections of the proteins that, in small quantities, the immune system uses naturally to attack invading organisms. Researchers have found that giving high doses of these immunoglobulins to patients can lessen the immune system attack on the nervous system. The most important part of the treatment for this syndrome is keeping the patient's body functioning during recovery of the nervous system. This can sometimes require placing the patient on:  A respirator.  A heart monitor.  Other machines that assist body function. This syndrome can be devastating due to its sudden and unexpected beginning. Most people reach the stage of greatest weakness within the first 2 weeks after symptoms appear. By the third week of the illness, 90 percent of all patients are at their weakest. The  recovery period may be as little as a few weeks. Or it can be as long as a few years. About 30 percent of patients still have a remaining weakness after 3 years. About 3 percent may suffer a return of muscle weakness and tingling sensations  many years after the initial attack. RESEARCH BEING DONE Scientists are concentrating on finding new treatments and refining existing ones. They are also looking at the workings of the immune system. They want to find which cells are responsible for beginning and carrying out the attack on the nervous system. The fact that so many cases of this syndrome begin after a viral or germ (bacterial ) infection suggests that certain characteristics of some viruses and bacteria may activate the immune system improperly. Investigators are searching for those characteristics. Neurological scientists, immunologists, virologists, and pharmacologists are all working together:  To learn how to prevent this disorder.  To make better therapies available when it strikes. FOR MORE INFORMATION Guillain-Barre Syndrome [employer] www.gbs-cipd.org Document Released: 04/19/2002 Document Revised: 07/22/2011 Document Reviewed: 06/30/2013 [facility] Patient Information 2015 [place], [place]. This information is not intended to replace advice given to you by your health care provider. Make sure you discuss any questions you have with your health care provider.

## 2014-05-12 NOTE — ED Provider Notes (Signed)
Chief Complaint   Numbness    History of Present Illness   [name] is a 73 year old female who's had a one-week history of slowly progressing numbness, tingling, and paresthesias that began the feet and nondistended as far as the knees. She's had cramping in the muscles of the calves. The paresthesias have not gone up above the knees. She denies any muscle weakness. She's had no trouble walking, although states that she has chronically poor balance. She denies any fever, chills, recent URI symptoms, recent vaccines, headache, diplopia, blurry vision, difficulty with speech or swallowing, back pain, bladder or bowel dysfunction, saddle anesthesia, or focal muscle weakness.  Review of Systems   Other than as noted above, the patient denies any of the following symptoms: Systemic:  No fever, chills, photophobia, stiff neck. Eye:  No blurred vision or diplopia. Cardiac:  No chest pain, shortness of breath, palpitations, or syncope.  Neuro:  No paresthesias, loss of consciousness, seizure activity, muscle weakness, trouble with coordination or gait, trouble speaking or swallowing. Psych:  No depression, anxiety or trouble sleeping.  [place]   Past medical history, family history, social history, meds, and allergies were reviewed.  Allergies include ACE inhibitors, amoxicillin, Benicar, citalopram, clonidine, droperidol, Flexeril, ketorolac, metoclopramide, mometasone, morphine, and olnesartan. Current meds include amlodipine, aspirin, Zyrtec, Flonase, hydrochlorothiazide, potassium chloride, Protonix, albuterol, Astelin, vitamin D3, Advair Diskus, Antivert, and omega-3 fatty acids. Medical history includes allergic rhinitis, hypertension, asthma, hypercholesterolemia, GERD, IBS, kidney stones, osteoarthritis, and fibromyalgia.  Physical Examination     Vital signs:  BP 148/54 mmHg  Pulse 62  Temp(Src) 98.3 F (36.8 C) (Oral)  Resp 18  SpO2 100% General:  Alert and oriented.  In no  distress. Eye:  Lids and conjunctivas normal.  PERRL,  Full EOMs.  Fundi benign with normal discs and vessels. ENT:  No cranial or facial tenderness to palpation.  TMs and canals clear.  Nasal mucosa was normal and uncongested without any drainage. No intra oral lesions, pharynx clear, mucous membranes moist, dentition normal. Neck:  Supple, full ROM, no tenderness to palpation.  No adenopathy or mass. No carotid bruit. Lungs: Clear to auscultation. Heart: Regular rhythm, no gallop or murmur. Neuro:  Alert and orented times 3.  Speech was clear, fluent, and appropriate.  Cranial nerves intact. No pronator drift, muscle strength normal. Finger to nose normal.  Ankle reflexes were absent, knee reflexes 1+ bilaterally. Romberg sign was abnormal. Unable to perform tandem gait well. She reports normal sensation to light touch in both legs. Muscle strength was normal and both lower extremities rated 5 over 5. Psych:  Normal affect.   Labs   Results for orders placed or performed during the hospital encounter of 05/12/14  I-STAT, chem 8  Result Value Ref Range   Sodium 140 135 - 145 mmol/L   Potassium 3.4 (L) 3.5 - 5.1 mmol/L   Chloride 101 96 - 112 mEq/L   BUN 21 6 - 23 mg/dL   Creatinine, Ser 0.60 0.50 - 1.10 mg/dL   Glucose, Bld 107 (H) 70 - 99 mg/dL   Calcium, Ion 1.19 1.13 - 1.30 mmol/L   TCO2 25 0 - 100 mmol/L   Hemoglobin 15.3 (H) 12.0 - 15.0 g/dL   HCT 45.0 36.0 - 46.0 %   Assessment   The encounter diagnosis was Paresthesias.  Differential diagnosis is Guillain Barr syndrome, neuropathy, or nerve compression syndrome.  Plan   The patient was transferred to the ED via private vehicle in stable  condition.  Medical Decision Making:  73 year old female has a one-week history of progressive numbness and tingling beginning in her feet and spreading up her legs as far as her knees with cramping in her calves. She denies any swelling or weakness. She is able to walk okay. On exam she  has normal strength in her legs and sensation to light touch is normal, but she has absent ankle reflexes and only 1+ knee reflexes. Her gait is normal, but she cannot do Romberg's maneuver or tandem gait. My concern is for Guillain-Barr syndrome. I feel she needs neurological evaluation.        Harden Mo, MD 05/12/14 [phone]

## 2014-05-14 ENCOUNTER — Other Ambulatory Visit: Payer: Self-pay | Admitting: Pulmonary Disease

## 2014-05-16 ENCOUNTER — Ambulatory Visit (INDEPENDENT_AMBULATORY_CARE_PROVIDER_SITE_OTHER): Payer: Medicare Other

## 2014-05-16 DIAGNOSIS — J309 Allergic rhinitis, unspecified: Secondary | ICD-10-CM | POA: Diagnosis not present

## 2014-05-19 DIAGNOSIS — M720 Palmar fascial fibromatosis [Dupuytren]: Secondary | ICD-10-CM | POA: Diagnosis not present

## 2014-05-23 ENCOUNTER — Ambulatory Visit (INDEPENDENT_AMBULATORY_CARE_PROVIDER_SITE_OTHER): Payer: Medicare Other

## 2014-05-23 DIAGNOSIS — J309 Allergic rhinitis, unspecified: Secondary | ICD-10-CM

## 2014-05-24 ENCOUNTER — Encounter: Payer: Self-pay | Admitting: Internal Medicine

## 2014-05-24 ENCOUNTER — Ambulatory Visit (INDEPENDENT_AMBULATORY_CARE_PROVIDER_SITE_OTHER): Payer: Medicare Other | Admitting: Internal Medicine

## 2014-05-24 VITALS — BP 128/76 | HR 75 | Temp 99.0°F | Resp 10 | Ht 62.0 in | Wt 142.0 lb

## 2014-05-24 DIAGNOSIS — K219 Gastro-esophageal reflux disease without esophagitis: Secondary | ICD-10-CM | POA: Diagnosis not present

## 2014-05-24 DIAGNOSIS — R200 Anesthesia of skin: Secondary | ICD-10-CM | POA: Diagnosis not present

## 2014-05-24 DIAGNOSIS — I10 Essential (primary) hypertension: Secondary | ICD-10-CM

## 2014-05-24 DIAGNOSIS — R209 Unspecified disturbances of skin sensation: Secondary | ICD-10-CM | POA: Diagnosis not present

## 2014-05-24 DIAGNOSIS — R202 Paresthesia of skin: Principal | ICD-10-CM

## 2014-05-24 NOTE — Progress Notes (Signed)
Patient ID: [name], female   DOB: December 18, 1940, 74 y.o.   MRN: [id number]    Chief Complaint  Patient presents with  . ER Follow-up    ER follow-up from 05/12/14, DX: Paresthesia of bilateral legs   . Immunizations    Patient was given prevnar 13 handout, will contact insurance company to confirm fully covered and call to schedule nurse visit, if covered.    Allergies  Allergen Reactions  . Ace Inhibitors   . Amoxicillin     REACTION: unknown? pain in right kidney  . Benicar Hct [Olmesartan Medoxomil-Hctz]     Extreme weakness  . Budesonide-Formoterol Fumarate     Causes tremors and numbness  . Chlorzoxazone [Chlorzoxazone]   . Citalopram Hydrobromide     REACTION: hives  . Citalopram Hydrobromide   . Clonidine Derivatives   . Droperidol     REACTION: hives  . Flexeril [Cyclobenzaprine Hcl]   . Ketorolac Tromethamine     REACTION: hives  . Ketorolac Tromethamine   . Metoclopramide Hcl   . Mometasone Furo-Formoterol Fum     Causes sore throat, blurred vision and unable to sleep--started on med 09-17-10  . Morphine     REACTION: hives and itching  . Olmesartan Medoxomil     REACTION: fatique    HPI Patient is here for post ED follow up. She was in the ED on 05/12/14 with bilateral lower extremity numbness and paresthesias upto knees. Her muscle strength was fine, had normal reflexes except for absent ankle reflex. She was discharged home with outpatient follow up.   Of note she had received cortisone injection to her back mid December 2015. She has history of siatica. The numbness and tingling still persists and mentions it being noticeable at rest. Denies any leg pain but has cramping in her toes with numbness and tingling at rest. She has not noticed any symptoms with movement. This wakes her up from her sleep Denies any crawling or restless leg symptom Denies any pain or tingling at present. Mentions some numbness in left leg.  ROS Denies any fever or  chills Denies any nausea, vomiting, diarrhea Denies any urinary complaints Appetite is fair Denies chest pain, dyspnea Denies any back pain  Past Medical History  Diagnosis Date  . ALLERGIC RHINITIS   . Hypertension   . Asthma   . Hypercholesterolemia     borderline  . GERD (gastroesophageal reflux disease)   . Diverticulosis of colon   . IBS (irritable bowel syndrome)   . Colon polyp   . History of nephrolithiasis   . Osteoarthritis   . Fibromyalgia   . Vitamin D deficiency   . Postconcussion syndrome   . Cervical strain, acute   . Dizziness   . Anxiety   . Personal history of allergy to unspecified medicinal agent   . Blood type O+    Current Outpatient Prescriptions on File Prior to Visit  Medication Sig Dispense Refill  . acetaminophen (TYLENOL) 500 MG tablet Take 500 mg by mouth every 6 (six) hours as needed (for headache as needed).     Marland Kitchen albuterol (PROVENTIL HFA;VENTOLIN HFA) 108 (90 BASE) MCG/ACT inhaler Inhale 2 puffs into the lungs every 4 (four) hours as needed for wheezing or shortness of breath. RESCUE 1 Inhaler prn  . amLODipine (NORVASC) 5 MG tablet Take one tablet by mouth once daily to control blood pressure 30 tablet 3  . aspirin 81 MG EC tablet Take 81 mg by mouth daily.      Marland Kitchen  azelastine (ASTELIN) 0.1 % nasal spray Place 1-2 sprays into both nostrils at bedtime. Use in each nostril as directed 30 mL 12  . Cetirizine HCl (ZYRTEC ALLERGY) 10 MG CAPS Take 1 capsule by mouth daily.      . Cholecalciferol (VITAMIN D3) 1000 UNITS CAPS Take by mouth daily.      . fluticasone (FLONASE) 50 MCG/ACT nasal spray USE 1-2 SPRAYS IN EACH NOSTRIL TWICE A DAY 16 g prn  . Fluticasone-Salmeterol (ADVAIR DISKUS) 250-50 MCG/DOSE AEPB 1 puff then rinse mouth, twice daily 60 each prn  . hydrochlorothiazide (MICROZIDE) 12.5 MG capsule TAKE ONE CAPSULE BY MOUTH EVERY DAY 30 capsule 6  . Injection Device MISC by Does not apply route. Allergy injection once every Monday,  Administered by Dr.Young    . KLOR-CON M20 20 MEQ tablet TAKE 1 TABLET EVERY DAY 30 tablet 2  . meclizine (ANTIVERT) 25 MG tablet TAKE 1 TABLET 3 TIMES A DAY AS NEEDED FOR DIZZINESS/NAUSEA 30 tablet 1  . Multiple Vitamins-Minerals (CENTRUM SILVER PO) Take by mouth daily.      . Omega-3 Fatty Acids (FISH OIL MAXIMUM STRENGTH) 1200 MG CAPS Take by mouth. 2 by mouth daily    . pantoprazole (PROTONIX) 40 MG tablet Take 1 tablet (40 mg total) by mouth daily. 30 tablet 3  . Propylene Glycol (SYSTANE BALANCE) 0.6 % SOLN Place 2 drops into both eyes 2 (two) times daily.      Marland Kitchen SALINE NASAL SPRAY NA Place 88 mcg into the nose 2 (two) times daily.     No current facility-administered medications on file prior to visit.   Physical exam BP 128/76 mmHg  Pulse 75  Temp(Src) 99 F (37.2 C) (Oral)  Resp 10  Ht 5' 2"  (1.575 m)  Wt 142 lb (64.411 kg)  BMI 25.97 kg/m2  SpO2 99%  Constitutional: She is oriented to person, place, and time and well-developed, well-nourished, and in no distress.   HENT:   Head: Normocephalic and atraumatic.  Cardiovascular: Normal rate, regular rhythm, normal heart sounds and intact distal pulses.    Pulmonary/Chest: Effort normal and breath sounds normal. No respiratory distress. She has no wheezes. She has no rales. She exhibits no tenderness.   Abdominal: Soft. Bowel sounds are normal.   Musculoskeletal: Normal range of motion. She exhibits no edema and no tenderness. Good distal pulses. Lymphadenopathy: She has no cervical adenopathy.  Neurological: She is alert and oriented to person, place, and time. Absent ankle reflex but rest of reflexes normal, normal muscle strength, normal tone Skin: Skin is warm and dry.  Psychiatric: Affect normal. Her mood appears normal   Labs CBC Latest Ref Rng 05/12/2014 08/25/2013 07/02/2012  WBC 3.4 - 10.8 x10E3/uL - 6.6 10.2  Hemoglobin 12.0 - 15.0 g/dL 15.3(H) 14.0 15.2(H)  Hematocrit 36.0 - 46.0 % 45.0 42.1 44.9  Platelets 150 -  379 x10E3/uL - 299 307.0   CMP Latest Ref Rng 05/12/2014 08/25/2013 07/02/2012  Glucose 70 - 99 mg/dL 107(H) 95 103(H)  BUN 6 - 23 mg/dL 21 14 22   Creatinine 0.50 - 1.10 mg/dL 0.60 0.65 0.8  Sodium 135 - 145 mmol/L 140 146(H) 140  Potassium 3.5 - 5.1 mmol/L 3.4(L) 5.2 3.8  Chloride 96 - 112 mEq/L 101 103 101  CO2 18 - 29 mmol/L - 28 32  Calcium 8.7 - 10.3 mg/dL - 10.1 9.3  Total Protein 6.0 - 8.5 g/dL - 6.9 7.1  Albumin 3.5 - 4.8 g/dL - 4.7 -  Total Bilirubin  0.0 - 1.2 mg/dL - 0.8 0.8  Alkaline Phos 39 - 117 IU/L - 101 83  AST 0 - 40 IU/L - 23 29  ALT 0 - 32 IU/L - 16 31   Lab Results  Component Value Date   TSH 1.710 01/05/2014   Assessment/plan  1. Numbness and tingling of both legs below knees Normal neurological exam with sensation to fine pinprick and vibration normal. Absent ankle reflexes but other reflexes normal. Has history of sciatica and recent steroid injection and symptom present only at rest. Will rule out thyroid and vitamin abnormality contributing to neuropathy and then consider nerve conduction study to assess further. - CMP - CBC with Differential - TSH - Vitamin B12 - Folate  2. Essential hypertension Stable bp, continue norvasc and hctz  3. Gastroesophageal reflux disease, esophagitis presence not specified Stable on protonix, continue current regimen

## 2014-05-25 LAB — CBC WITH DIFFERENTIAL/PLATELET
BASOS ABS: 0 10*3/uL (ref 0.0–0.2)
Basos: 1 %
Eos: 2 %
Eosinophils Absolute: 0.1 10*3/uL (ref 0.0–0.4)
HCT: 40.2 % (ref 34.0–46.6)
HEMOGLOBIN: 13.8 g/dL (ref 11.1–15.9)
IMMATURE GRANULOCYTES: 0 %
Immature Grans (Abs): 0 10*3/uL (ref 0.0–0.1)
LYMPHS: 43 %
Lymphocytes Absolute: 2.9 10*3/uL (ref 0.7–3.1)
MCH: 29.7 pg (ref 26.6–33.0)
MCHC: 34.3 g/dL (ref 31.5–35.7)
MCV: 87 fL (ref 79–97)
Monocytes Absolute: 0.6 10*3/uL (ref 0.1–0.9)
Monocytes: 10 %
Neutrophils Absolute: 2.9 10*3/uL (ref 1.4–7.0)
Neutrophils Relative %: 44 %
RBC: 4.65 x10E6/uL (ref 3.77–5.28)
RDW: 13.8 % (ref 12.3–15.4)
WBC: 6.5 10*3/uL (ref 3.4–10.8)

## 2014-05-25 LAB — TSH: TSH: 2.25 u[IU]/mL (ref 0.450–4.500)

## 2014-05-25 LAB — COMPREHENSIVE METABOLIC PANEL
ALT: 16 IU/L (ref 0–32)
AST: 22 IU/L (ref 0–40)
Albumin/Globulin Ratio: 2 (ref 1.1–2.5)
Albumin: 4.3 g/dL (ref 3.5–4.8)
Alkaline Phosphatase: 107 IU/L (ref 39–117)
BILIRUBIN TOTAL: 0.3 mg/dL (ref 0.0–1.2)
BUN / CREAT RATIO: 22 (ref 11–26)
BUN: 15 mg/dL (ref 8–27)
CO2: 28 mmol/L (ref 18–29)
CREATININE: 0.68 mg/dL (ref 0.57–1.00)
Calcium: 9.3 mg/dL (ref 8.7–10.3)
Chloride: 99 mmol/L (ref 97–108)
GFR calc Af Amer: 99 mL/min/{1.73_m2} (ref >59)
GFR, EST NON AFRICAN AMERICAN: 86 mL/min/{1.73_m2} (ref >59)
Globulin, Total: 2.1 g/dL (ref 1.5–4.5)
Glucose: 71 mg/dL (ref 65–99)
POTASSIUM: 4.1 mmol/L (ref 3.5–5.2)
SODIUM: 139 mmol/L (ref 134–144)
Total Protein: 6.4 g/dL (ref 6.0–8.5)

## 2014-05-25 LAB — VITAMIN B12: VITAMIN B 12: 430 pg/mL (ref 211–946)

## 2014-05-25 LAB — FOLATE

## 2014-05-30 ENCOUNTER — Ambulatory Visit (INDEPENDENT_AMBULATORY_CARE_PROVIDER_SITE_OTHER): Payer: Medicare Other

## 2014-05-30 DIAGNOSIS — J309 Allergic rhinitis, unspecified: Secondary | ICD-10-CM

## 2014-06-06 ENCOUNTER — Ambulatory Visit: Payer: Medicare Other

## 2014-06-08 ENCOUNTER — Ambulatory Visit (INDEPENDENT_AMBULATORY_CARE_PROVIDER_SITE_OTHER): Payer: Medicare Other

## 2014-06-08 DIAGNOSIS — J309 Allergic rhinitis, unspecified: Secondary | ICD-10-CM | POA: Diagnosis not present

## 2014-06-13 ENCOUNTER — Ambulatory Visit (INDEPENDENT_AMBULATORY_CARE_PROVIDER_SITE_OTHER): Payer: Medicare Other

## 2014-06-13 DIAGNOSIS — J309 Allergic rhinitis, unspecified: Secondary | ICD-10-CM | POA: Diagnosis not present

## 2014-06-20 ENCOUNTER — Encounter: Payer: Self-pay | Admitting: Internal Medicine

## 2014-06-20 ENCOUNTER — Ambulatory Visit (INDEPENDENT_AMBULATORY_CARE_PROVIDER_SITE_OTHER): Payer: Medicare Other

## 2014-06-20 DIAGNOSIS — J309 Allergic rhinitis, unspecified: Secondary | ICD-10-CM | POA: Diagnosis not present

## 2014-06-27 ENCOUNTER — Ambulatory Visit: Payer: Medicare Other

## 2014-06-29 ENCOUNTER — Ambulatory Visit (INDEPENDENT_AMBULATORY_CARE_PROVIDER_SITE_OTHER): Payer: Medicare Other

## 2014-06-29 DIAGNOSIS — J309 Allergic rhinitis, unspecified: Secondary | ICD-10-CM

## 2014-06-30 ENCOUNTER — Other Ambulatory Visit: Payer: Self-pay | Admitting: Internal Medicine

## 2014-07-04 ENCOUNTER — Ambulatory Visit (INDEPENDENT_AMBULATORY_CARE_PROVIDER_SITE_OTHER): Payer: Medicare Other

## 2014-07-04 DIAGNOSIS — J309 Allergic rhinitis, unspecified: Secondary | ICD-10-CM | POA: Diagnosis not present

## 2014-07-11 ENCOUNTER — Ambulatory Visit (INDEPENDENT_AMBULATORY_CARE_PROVIDER_SITE_OTHER): Payer: Medicare Other

## 2014-07-11 DIAGNOSIS — J309 Allergic rhinitis, unspecified: Secondary | ICD-10-CM | POA: Diagnosis not present

## 2014-07-18 ENCOUNTER — Ambulatory Visit (INDEPENDENT_AMBULATORY_CARE_PROVIDER_SITE_OTHER): Payer: Medicare Other

## 2014-07-18 DIAGNOSIS — J309 Allergic rhinitis, unspecified: Secondary | ICD-10-CM | POA: Diagnosis not present

## 2014-07-25 ENCOUNTER — Ambulatory Visit (INDEPENDENT_AMBULATORY_CARE_PROVIDER_SITE_OTHER): Payer: Medicare Other

## 2014-07-25 ENCOUNTER — Other Ambulatory Visit: Payer: Self-pay | Admitting: Internal Medicine

## 2014-07-25 ENCOUNTER — Other Ambulatory Visit: Payer: Self-pay | Admitting: Pulmonary Disease

## 2014-07-25 DIAGNOSIS — J309 Allergic rhinitis, unspecified: Secondary | ICD-10-CM

## 2014-07-27 ENCOUNTER — Other Ambulatory Visit: Payer: Self-pay | Admitting: Internal Medicine

## 2014-08-01 ENCOUNTER — Ambulatory Visit (INDEPENDENT_AMBULATORY_CARE_PROVIDER_SITE_OTHER): Payer: Medicare Other

## 2014-08-01 DIAGNOSIS — J309 Allergic rhinitis, unspecified: Secondary | ICD-10-CM | POA: Diagnosis not present

## 2014-08-08 ENCOUNTER — Ambulatory Visit (INDEPENDENT_AMBULATORY_CARE_PROVIDER_SITE_OTHER): Payer: Medicare Other

## 2014-08-08 DIAGNOSIS — J309 Allergic rhinitis, unspecified: Secondary | ICD-10-CM

## 2014-08-11 ENCOUNTER — Ambulatory Visit (INDEPENDENT_AMBULATORY_CARE_PROVIDER_SITE_OTHER): Payer: Medicare Other

## 2014-08-11 DIAGNOSIS — J309 Allergic rhinitis, unspecified: Secondary | ICD-10-CM | POA: Diagnosis not present

## 2014-08-15 ENCOUNTER — Ambulatory Visit (INDEPENDENT_AMBULATORY_CARE_PROVIDER_SITE_OTHER): Payer: Medicare Other

## 2014-08-15 DIAGNOSIS — J309 Allergic rhinitis, unspecified: Secondary | ICD-10-CM | POA: Diagnosis not present

## 2014-08-22 ENCOUNTER — Ambulatory Visit: Payer: Medicare Other

## 2014-08-23 ENCOUNTER — Other Ambulatory Visit: Payer: Self-pay | Admitting: Nurse Practitioner

## 2014-08-24 ENCOUNTER — Ambulatory Visit: Payer: Medicare Other

## 2014-08-25 ENCOUNTER — Ambulatory Visit (INDEPENDENT_AMBULATORY_CARE_PROVIDER_SITE_OTHER): Payer: Medicare Other

## 2014-08-25 ENCOUNTER — Encounter: Payer: Self-pay | Admitting: Internal Medicine

## 2014-08-25 ENCOUNTER — Ambulatory Visit (INDEPENDENT_AMBULATORY_CARE_PROVIDER_SITE_OTHER): Payer: Medicare Other | Admitting: Internal Medicine

## 2014-08-25 VITALS — BP 122/74 | HR 64 | Ht 63.0 in | Wt 148.0 lb

## 2014-08-25 DIAGNOSIS — J4520 Mild intermittent asthma, uncomplicated: Secondary | ICD-10-CM

## 2014-08-25 DIAGNOSIS — J309 Allergic rhinitis, unspecified: Secondary | ICD-10-CM | POA: Diagnosis not present

## 2014-08-25 DIAGNOSIS — Z23 Encounter for immunization: Secondary | ICD-10-CM | POA: Diagnosis not present

## 2014-08-25 DIAGNOSIS — K219 Gastro-esophageal reflux disease without esophagitis: Secondary | ICD-10-CM | POA: Diagnosis not present

## 2014-08-25 DIAGNOSIS — J3089 Other allergic rhinitis: Secondary | ICD-10-CM

## 2014-08-25 DIAGNOSIS — J302 Other seasonal allergic rhinitis: Secondary | ICD-10-CM

## 2014-08-25 NOTE — Progress Notes (Signed)
01/16/12- 03/04/11- 39 yoF former smoker referred courtesy of Dr [name] for allergy evaluation. She says for most of her adult life she has had seasonal rhinitis. Allergy skin testing remotely by [place] led to allergy vaccine several decades ago. She cannot list all of the medications she has felt intolerant to. Some of them may have been associated with urticaria and itching. Food intolerance includes peanut butter/nausea, ice cream/diarrhea. Insect stings, mainly mosquitoes, caused local swelling and itching.  Since July 2012 she has had at least 4 "attacks" dizziness, frontal headache particularly lying down at night, deep cough and sneeze. She had seen Dr. Ovid Curd and our nurse practitioner. There is no lasting benefit from antibiotics. CT scan of the sinuses was negative. Between "attacks" she feels well. Triggers seem to include strong smells, wet leaves and rainy weather. Loud music makes her dizzy causes headache and discomfort around the eyes. She has had no ENT surgery and no asthma. She has had a number of emergency room trips for "allergy attack". She lives in an apartment with central air conditioning, wall-to-wall carpet, no mold and no pets.  04/09/11- 75 yoF former smoker followed for allergic rhinitis, asthma, food intolerance, multiple medication intolerance. In the last day or so she has been aware of some cough with frontal headache but she has had a sense of dizziness with photophobia and sensitivity to sound probably for 2 or 3 weeks. She blames her discomfort on "allergy". I suggested  head congestion, possibly some migraine, dry heat and maybe a viral infection could explain her symptoms better. She has had intermittent epistaxis from the left nostril. Aware of reflux mostly controlled with Nexium. Allergy profile 03/04/2011-IgE 13.5, no elevation of specific antibody levels on this panel. ESR-normal.  07/24/11-  52 yoF former smoker followed for allergic rhinitis, asthma, food  intolerance, multiple medication intolerance. Blames "allergy" for recurrent episodes of bronchitis through the winter. Asks if proximity to her wood kitchen cabinets could cause this. Told her no, and educated allergic vs viral. PFT 04/18/11- reviewed- Normal spirometry flows and lung volumes. Reduced DLCO 67 Allergy skin testing- Positive intradermal reactions to weed and tree pollens, dust mite, mold.  10/15/11-   28 yoF former smoker followed for allergic rhinitis, asthma, food intolerance, multiple medication intolerance. Feels like allergy vaccine is working well. Continues building vaccine now at 1:500. Spring and fall are usually her worst seasons and she has done well this spring.  01/16/12- 73 yoF former smoker followed for allergic rhinitis, asthma, food intolerance, multiple medication intolerance. Recent flare up of allergies-change in weather; still on vaccine and feels its working well for her. Blames rain for wheezy bronchitis and chest congestion. Says she is "really pleased" with her allergy vaccine now at 1:50 [place]. If she can control rhinitis she thinks she can prevent flareups of her asthmatic bronchitis. Wants to wait on flu vaccine.  08/07/12- 13 yoF former smoker followed for allergic rhinitis, asthma, food intolerance, multiple medication intolerance. FOLLOWS FOR: doing so much better on vaccine-"feels like I have my life back again" Continues allergy vaccine 1:50 GH with no problems. She remains outspoken about the improvement she credits it with giving her. Occasional sneeze. Zyrtec at bedtime. No asthma at all.  01/06/14- 73 yoF former smoker followed for allergic rhinitis, asthma, food intolerance, multiple medication intolerance. ACUTE VISIT: having allergy flare up(head pain, hoarseness, tired,ear aches, and mild sore throat.)-lasts about 2 days after getting allergy injection on Monday then this starts on Saturday.  02/21/14- 18 yoF  former smoker followed for allergic  rhinitis, asthma, food intolerance, multiple medication intolerance. FOLLOWS SUO:RVIFB on allergy vaccine 1:   GH and doing well with the combination of that the the nasal spray on the weekends.  08/25/14-  73 yoF former smoker followed for allergic rhinitis, asthma, food intolerance, multiple medication intolerance Follows for: allergy concerns/asthma; SOB since Saturaday; hoarseness; mild cough; sinus issue Allergy vaccine 1:10  [place] Outside more. "Hayfever attack"-head congestion, ears itch. No recent episodes of bronchitis or wheezing. Reflux causes some hoarseness-on Protonix  ROS-see HPI Constitutional:   No-   weight loss, night sweats, fevers, chills, fatigue, lassitude. HEENT:   No- headaches, difficulty swallowing, tooth/dental problems, sore throat,       +Minimal sneezing, no-itching, ear ache, +nasal congestion, post nasal drip,  CV:  No-   chest pain, orthopnea, PND, swelling in lower extremities, anasarca, dizziness, palpitations Resp: No-   shortness of breath with exertion or at rest.              No-   productive cough,  non-productive cough,  No- coughing up of blood.              No-   change in color of mucus.   wheezing.   Skin: No-   rash or lesions. GI:  No-   heartburn, indigestion, abdominal pain, nausea, vomiting,  GU: MS:  No-   joint pain or swelling.   Neuro-     nothing unusual Psych:  No- change in mood or affect. No depression or anxiety.  No memory loss.  OBJ General- Alert, Oriented, Affect-appropriate, Distress- none acute. Pleasant and talkative, over weight Skin- rash-none, lesions- none, excoriation- none Lymphadenopathy- none Head- atraumatic            Eyes- Gross vision intact, PERRLA, conjunctivae clear secretions            Ears- Hearing, canals-normal            Nose- no-mucus bridging, no-Septal dev, polyps, erosion, perforation             Throat- Mallampati II , mucosa clear , drainage- none, tonsils- atrophic, + mild hoarseness Neck-  flexible , trachea midline, no stridor , thyroid nl, carotid no bruit Chest - symmetrical excursion , unlabored           Heart/CV- RRR , no murmur , no gallop  , no rub, nl s1 s2                           - JVD- none , edema- none, stasis changes- none, varices- none           Lung-clear unlabored, cough-none, dullness-none, rub- none           Chest wall-  Abd- Br/ Gen/ Rectal- Not done, not indicated Extrem- cyanosis- none, clubbing, none, atrophy- none, strength- nl Neuro- grossly intact to observation

## 2014-08-25 NOTE — Patient Instructions (Signed)
We can continue allergy vaccine 1:10 GH  Pneumovax 23   Pneumonia vaccine  Consider adding otc acid blocker Pepcid/ famotadine during times when you are having more acid indigestion. You could take your protonix once daily before a meal, and pepcid once daily before a meal

## 2014-08-29 ENCOUNTER — Ambulatory Visit (INDEPENDENT_AMBULATORY_CARE_PROVIDER_SITE_OTHER): Payer: Medicare Other

## 2014-08-29 DIAGNOSIS — J309 Allergic rhinitis, unspecified: Secondary | ICD-10-CM | POA: Diagnosis not present

## 2014-09-05 ENCOUNTER — Ambulatory Visit (INDEPENDENT_AMBULATORY_CARE_PROVIDER_SITE_OTHER): Payer: Medicare Other

## 2014-09-05 DIAGNOSIS — J309 Allergic rhinitis, unspecified: Secondary | ICD-10-CM | POA: Diagnosis not present

## 2014-09-12 ENCOUNTER — Ambulatory Visit: Payer: Medicare Other

## 2014-09-19 ENCOUNTER — Ambulatory Visit (INDEPENDENT_AMBULATORY_CARE_PROVIDER_SITE_OTHER): Payer: Medicare Other

## 2014-09-19 DIAGNOSIS — J309 Allergic rhinitis, unspecified: Secondary | ICD-10-CM

## 2014-09-21 ENCOUNTER — Other Ambulatory Visit: Payer: Self-pay | Admitting: Internal Medicine

## 2014-09-23 ENCOUNTER — Other Ambulatory Visit: Payer: Medicare Other

## 2014-09-23 DIAGNOSIS — I10 Essential (primary) hypertension: Secondary | ICD-10-CM

## 2014-09-23 DIAGNOSIS — E559 Vitamin D deficiency, unspecified: Secondary | ICD-10-CM | POA: Diagnosis not present

## 2014-09-23 DIAGNOSIS — E785 Hyperlipidemia, unspecified: Secondary | ICD-10-CM | POA: Diagnosis not present

## 2014-09-24 LAB — COMPREHENSIVE METABOLIC PANEL
A/G RATIO: 2 (ref 1.1–2.5)
ALT: 15 IU/L (ref 0–32)
AST: 22 IU/L (ref 0–40)
Albumin: 4.3 g/dL (ref 3.5–4.8)
Alkaline Phosphatase: 97 IU/L (ref 39–117)
BILIRUBIN TOTAL: 0.5 mg/dL (ref 0.0–1.2)
BUN/Creatinine Ratio: 29 — ABNORMAL HIGH (ref 11–26)
BUN: 18 mg/dL (ref 8–27)
CALCIUM: 9.4 mg/dL (ref 8.7–10.3)
CO2: 27 mmol/L (ref 18–29)
Chloride: 100 mmol/L (ref 97–108)
Creatinine, Ser: 0.63 mg/dL (ref 0.57–1.00)
GFR calc Af Amer: 101 mL/min/{1.73_m2} (ref >59)
GFR calc non Af Amer: 87 mL/min/{1.73_m2} (ref >59)
GLUCOSE: 105 mg/dL — AB (ref 65–99)
Globulin, Total: 2.2 g/dL (ref 1.5–4.5)
Potassium: 3.5 mmol/L (ref 3.5–5.2)
SODIUM: 142 mmol/L (ref 134–144)
Total Protein: 6.5 g/dL (ref 6.0–8.5)

## 2014-09-24 LAB — LIPID PANEL
Chol/HDL Ratio: 3.5 ratio units (ref 0.0–4.4)
Cholesterol, Total: 222 mg/dL — ABNORMAL HIGH (ref 100–199)
HDL: 63 mg/dL (ref >39)
LDL Calculated: 134 mg/dL — ABNORMAL HIGH (ref 0–99)
TRIGLYCERIDES: 126 mg/dL (ref 0–149)
VLDL Cholesterol Cal: 25 mg/dL (ref 5–40)

## 2014-09-24 LAB — CBC WITH DIFFERENTIAL/PLATELET
Basophils Absolute: 0.1 10*3/uL (ref 0.0–0.2)
Basos: 1 %
EOS (ABSOLUTE): 0.2 10*3/uL (ref 0.0–0.4)
Eos: 2 %
HEMATOCRIT: 41.1 % (ref 34.0–46.6)
Hemoglobin: 13.9 g/dL (ref 11.1–15.9)
IMMATURE GRANS (ABS): 0 10*3/uL (ref 0.0–0.1)
Immature Granulocytes: 0 %
Lymphocytes Absolute: 3.5 10*3/uL — ABNORMAL HIGH (ref 0.7–3.1)
Lymphs: 50 %
MCH: 28.9 pg (ref 26.6–33.0)
MCHC: 33.8 g/dL (ref 31.5–35.7)
MCV: 85 fL (ref 79–97)
Monocytes Absolute: 0.7 10*3/uL (ref 0.1–0.9)
Monocytes: 10 %
NEUTROS PCT: 37 %
Neutrophils Absolute: 2.6 10*3/uL (ref 1.4–7.0)
Platelets: 309 10*3/uL (ref 150–379)
RBC: 4.81 x10E6/uL (ref 3.77–5.28)
RDW: 13.7 % (ref 12.3–15.4)
WBC: 7 10*3/uL (ref 3.4–10.8)

## 2014-09-24 LAB — VITAMIN D 25 HYDROXY (VIT D DEFICIENCY, FRACTURES): VIT D 25 HYDROXY: 37.1 ng/mL (ref 30.0–100.0)

## 2014-09-24 LAB — TSH: TSH: 3.08 u[IU]/mL (ref 0.450–4.500)

## 2014-09-26 ENCOUNTER — Encounter: Payer: Self-pay | Admitting: Internal Medicine

## 2014-09-26 ENCOUNTER — Ambulatory Visit (INDEPENDENT_AMBULATORY_CARE_PROVIDER_SITE_OTHER): Payer: Medicare Other

## 2014-09-26 DIAGNOSIS — J309 Allergic rhinitis, unspecified: Secondary | ICD-10-CM

## 2014-09-29 ENCOUNTER — Encounter: Payer: Self-pay | Admitting: Nurse Practitioner

## 2014-09-29 ENCOUNTER — Ambulatory Visit (INDEPENDENT_AMBULATORY_CARE_PROVIDER_SITE_OTHER): Payer: Medicare Other | Admitting: Nurse Practitioner

## 2014-09-29 VITALS — BP 130/72 | HR 79 | Temp 98.3°F | Ht 63.0 in | Wt 159.2 lb

## 2014-09-29 DIAGNOSIS — I10 Essential (primary) hypertension: Secondary | ICD-10-CM

## 2014-09-29 DIAGNOSIS — M1990 Unspecified osteoarthritis, unspecified site: Secondary | ICD-10-CM | POA: Diagnosis not present

## 2014-09-29 DIAGNOSIS — K219 Gastro-esophageal reflux disease without esophagitis: Secondary | ICD-10-CM

## 2014-09-29 DIAGNOSIS — R739 Hyperglycemia, unspecified: Secondary | ICD-10-CM | POA: Diagnosis not present

## 2014-09-29 DIAGNOSIS — E785 Hyperlipidemia, unspecified: Secondary | ICD-10-CM | POA: Diagnosis not present

## 2014-09-29 DIAGNOSIS — J4520 Mild intermittent asthma, uncomplicated: Secondary | ICD-10-CM

## 2014-09-29 DIAGNOSIS — E559 Vitamin D deficiency, unspecified: Secondary | ICD-10-CM | POA: Diagnosis not present

## 2014-09-29 DIAGNOSIS — J309 Allergic rhinitis, unspecified: Secondary | ICD-10-CM

## 2014-09-29 NOTE — Progress Notes (Signed)
Patient ID: [name], female   DOB: 04/19/1941, 74 y.o.   MRN: [id number]    PCP: Lauree Chandler, NP  Allergies  Allergen Reactions  . Ace Inhibitors   . Amoxicillin     REACTION: unknown? pain in right kidney  . Benicar Hct [Olmesartan Medoxomil-Hctz]     Extreme weakness  . Budesonide-Formoterol Fumarate     Causes tremors and numbness  . Chlorzoxazone [Chlorzoxazone]   . Citalopram Hydrobromide     REACTION: hives  . Citalopram Hydrobromide   . Clonidine Derivatives   . Droperidol     REACTION: hives  . Flexeril [Cyclobenzaprine Hcl]   . Ketorolac Tromethamine     REACTION: hives  . Ketorolac Tromethamine   . Metoclopramide Hcl   . Mometasone Furo-Formoterol Fum     Causes sore throat, blurred vision and unable to sleep--started on med 09-17-10  . Morphine     REACTION: hives and itching  . Olmesartan Medoxomil     REACTION: fatique    Chief Complaint  Patient presents with  . Medical Management of Chronic Issues    6 month follow-up, discuss labs (copy printed)      HPI: Patient is a 74 y.o. female seen in the office today for routine follow up on chronic conditions. Pt with a pmh of GERD, HTN, vertigo, allergic rhinitis and asthma. Has gained weight since last visit. Reports she has an obsession with eating.  Increased stress therefore eating more.  conts to follow up with pulmonary for asthma and allergies. Doing well currently  Review of Systems:  Review of Systems  Constitutional: Negative for fever and chills.  HENT: Positive for hearing loss. Negative for congestion, ear pain, sore throat and tinnitus.   Eyes:       Cataracts- sees eye MD  Respiratory: Negative for cough, shortness of breath and wheezing.   Cardiovascular: Negative for chest pain and palpitations.  Gastrointestinal: Negative for abdominal pain, diarrhea, constipation and blood in stool.  Genitourinary: Negative for dysuria.  Musculoskeletal: Negative for myalgias.  Skin:  Negative for rash.  Allergic/Immunologic: Positive for environmental allergies.  Neurological: Negative for weakness and headaches.  Psychiatric/Behavioral: Negative for suicidal ideas. The patient is nervous/anxious (would not want medications).     Past Medical History  Diagnosis Date  . ALLERGIC RHINITIS   . Hypertension   . Asthma   . Hypercholesterolemia     borderline  . GERD (gastroesophageal reflux disease)   . Diverticulosis of colon   . IBS (irritable bowel syndrome)   . Colon polyp   . History of nephrolithiasis   . Osteoarthritis   . Fibromyalgia   . Vitamin D deficiency   . Postconcussion syndrome   . Cervical strain, acute   . Dizziness   . Anxiety   . Personal history of allergy to unspecified medicinal agent   . Blood type O+    Past Surgical History  Procedure Laterality Date  . Cholecystectomy, laparoscopic  2004    for gallstone pancreatitis  . Thyroid surgery    . Knee surgery Right   . Knee surgery Left    Social History:   reports that she quit smoking about 52 years ago. Her smoking use included Cigarettes. She has a 16 pack-year smoking history. She has never used smokeless tobacco. She reports that she does not drink alcohol or use illicit drugs.  Family History  Problem Relation Age of Onset  . Breast cancer Mother   . Alzheimer's disease  Mother   . Heart disease Mother   . Ovarian cancer Paternal Grandmother   . Arthritis Other     Cousins   . COPD Brother   . COPD Cousin     Maternal side   . Heart attack Maternal Uncle   . Heart disease Sister   . Alcohol abuse Sister     Medications: Patient's Medications  New Prescriptions   No medications on file  Previous Medications   ACETAMINOPHEN (TYLENOL) 500 MG TABLET    Take 500 mg by mouth every 6 (six) hours as needed (for headache as needed).    ALBUTEROL (PROVENTIL HFA;VENTOLIN HFA) 108 (90 BASE) MCG/ACT INHALER    Inhale 2 puffs into the lungs every 4 (four) hours as needed for  wheezing or shortness of breath. RESCUE   AMLODIPINE (NORVASC) 5 MG TABLET    TAKE 1 TABLET BY MOUTH DAILY TO CONTROL BLOOD PRESSURE   ASPIRIN 81 MG EC TABLET    Take 81 mg by mouth daily.     CETIRIZINE HCL (ZYRTEC ALLERGY) 10 MG CAPS    Take 1 capsule by mouth daily.     CHOLECALCIFEROL (VITAMIN D3) 1000 UNITS CAPS    Take by mouth daily.     FLUTICASONE (FLONASE) 50 MCG/ACT NASAL SPRAY    USE 1-2 SPRAYS IN EACH NOSTRIL TWICE A DAY   FLUTICASONE-SALMETEROL (ADVAIR DISKUS) 250-50 MCG/DOSE AEPB    1 puff then rinse mouth, twice daily   HYDROCHLOROTHIAZIDE (MICROZIDE) 12.5 MG CAPSULE    TAKE ONE CAPSULE BY MOUTH EVERY DAY   INJECTION DEVICE MISC    by Does not apply route. Allergy injection once every Monday, Administered by Dr.Young   MECLIZINE (ANTIVERT) 25 MG TABLET    TAKE 1 TABLET 3 TIMES A DAY AS NEEDED FOR DIZZINESS/NAUSEA   MULTIPLE VITAMINS-MINERALS (CENTRUM SILVER PO)    Take by mouth daily.     OMEGA-3 FATTY ACIDS (FISH OIL MAXIMUM STRENGTH) 1200 MG CAPS    Take by mouth. 2 by mouth daily   PANTOPRAZOLE (PROTONIX) 40 MG TABLET    TAKE 1 TABLET BY MOUTH EVERY DAY   POTASSIUM CHLORIDE SA (K-DUR,KLOR-CON) 20 MEQ TABLET    TAKE 1 TABLET BY MOUTH DAILY   PROPYLENE GLYCOL (SYSTANE BALANCE) 0.6 % SOLN    Place 2 drops into both eyes 2 (two) times daily.     SALINE NASAL SPRAY NA    Place 88 mcg into the nose 2 (two) times daily.  Modified Medications   No medications on file  Discontinued Medications   AZELASTINE (ASTELIN) 0.1 % NASAL SPRAY    Place 1-2 sprays into both nostrils at bedtime. Use in each nostril as directed     Physical Exam:  Filed Vitals:   09/29/14 0942  BP: 130/72  Pulse: 79  Temp: 98.3 F (36.8 C)  TempSrc: Oral  Height: 5' 3"  (1.6 m)  Weight: 159 lb 3.2 oz (72.213 kg)  SpO2: 97%    Physical Exam  Constitutional: She is oriented to person, place, and time. She appears well-developed and well-nourished. No distress.  Neck: Normal range of motion. Neck  supple.  Cardiovascular: Normal rate, regular rhythm and normal heart sounds.   Pulmonary/Chest: Effort normal and breath sounds normal.  Abdominal: Soft. Bowel sounds are normal.  Musculoskeletal: She exhibits no edema or tenderness.  Neurological: She is alert and oriented to person, place, and time.  Skin: Skin is warm and dry. She is not diaphoretic.  Psychiatric: She has  a normal mood and affect.    Labs reviewed: Basic Metabolic Panel:  Recent Labs  01/05/14 1625 05/12/14 1019 05/24/14 1135 09/23/14 0809  NA  --  140 139 142  K  --  3.4* 4.1 3.5  CL  --  101 99 100  CO2  --   --  28 27  GLUCOSE  --  107* 71 105*  BUN  --  21 15 18   CREATININE  --  0.60 0.68 0.63  CALCIUM  --   --  9.3 9.4  TSH 1.710  --  2.250 3.080   Liver Function Tests:  Recent Labs  05/24/14 1135 09/23/14 0809  AST 22 22  ALT 16 15  ALKPHOS 107 97  BILITOT 0.3 0.5  PROT 6.4 6.5   No results for input(s): LIPASE, AMYLASE in the last 8760 hours. No results for input(s): AMMONIA in the last 8760 hours. CBC:  Recent Labs  05/12/14 1019 05/24/14 1135 09/23/14 0809  WBC  --  6.5 7.0  NEUTROABS  --  2.9 2.6  HGB 15.3* 13.8  --   HCT 45.0 40.2 41.1  MCV  --  87  --    Lipid Panel:  Recent Labs  03/25/14 1043 09/23/14 0809  CHOL 224* 222*  HDL 67 63  LDLCALC 142* 134*  TRIG 73 126  CHOLHDL 3.3 3.5   TSH:  Recent Labs  01/05/14 1625 05/24/14 1135 09/23/14 0809  TSH 1.710 2.250 3.080   A1C: No results found for: HGBA1C   Assessment/Plan 1. Essential hypertension - well controlled on current regimen - Basic metabolic panel; Future  2. Allergic rhinitis, unspecified allergic rhinitis type -cont to follow up with pulmonary regarding allergies and asthma, no acute issues reported.   3. Asthma, allergic, mild intermittent, uncomplicated cont to follow up with pulmonary regarding allergies and asthma, no acute issues, reports medication compliance no shortness of  breath or worsening cough or congestion   4. Gastroesophageal reflux disease, esophagitis presence not specified Does not take medication routinely as she should, however denies any worsening symptoms, reports it has been bad in the past prior to medication, discussed lifestyle changes as well.   5. Osteoarthritis, unspecified osteoarthritis type, unspecified site -no worsening in pain. Does not take routine medication for arthritis.   6. Dyslipidemia -not currently on medications, discussed lifestyle modfiication, heart healthy diet information given.   7. Vitamin D deficiency Vit D level at goal, conts Vit D 1000 units daily   8. Hyperglycemia -eating a lot of sweets and desserts, aware she is not making good choices, discussed lifestyle modifications including diet and exercise changes.  - Basic metabolic panel; Future

## 2014-09-29 NOTE — Patient Instructions (Signed)
Work on diet and exercise (30 mins cardiovascular activity as tolerated/5 days a week) To start heart healthy diet, avoid sweets and to control the amount of carbohydrates you eat (starchy foods such as flour, bread, and potatoes), avoid high fat dairy (like ice cream, whole milk, cheeses), can buy a heart healthy cook book from the american heart association to help with meal planning.  Cardiac Diet This diet can help prevent heart disease and stroke. Many factors influence your heart health, including eating and exercise habits. Coronary risk rises a lot with abnormal blood fat (lipid) levels. Cardiac meal planning includes limiting unhealthy fats, increasing healthy fats, and making other small dietary changes. General guidelines are as follows:  Adjust calorie intake to reach and maintain desirable body weight.  Limit total fat intake to less than 30% of total calories. Saturated fat should be less than 7% of calories.  Saturated fats are found in animal products and in some vegetable products. Saturated vegetable fats are found in coconut oil, cocoa butter, palm oil, and palm kernel oil. Read labels carefully to avoid these products as much as possible. Use butter in moderation. Choose tub margarines and oils that have 2 grams of fat or less. Good cooking oils are canola and olive oils.  Practice low-fat cooking techniques. Do not fry food. Instead, broil, bake, boil, steam, grill, roast on a rack, stir-fry, or microwave it. Other fat reducing suggestions include:  Remove the skin from poultry.  Remove all visible fat from meats.  Skim the fat off stews, soups, and gravies before serving them.  Steam vegetables in water or broth instead of sauting them in fat.  Avoid foods with trans fat (or hydrogenated oils), such as commercially fried foods and commercially baked goods. Commercial shortening and deep-frying fats will contain trans fat.  Increase intake of fruits, vegetables, whole  grains, and legumes to replace foods high in fat.  Increase consumption of nuts, legumes, and seeds to at least 4 servings weekly. One serving of a legume equals  cup, and 1 serving of nuts or seeds equals  cup.  Choose whole grains more often. Have 3 servings per day (a serving is 1 ounce [oz]).  Eat 4 to 5 servings of vegetables per day. A serving of vegetables is 1 cup of raw leafy vegetables;  cup of raw or cooked cut-up vegetables;  cup of vegetable juice.  Eat 4 to 5 servings of fruit per day. A serving of fruit is 1 medium whole fruit;  cup of dried fruit;  cup of fresh, frozen, or canned fruit;  cup of 100% fruit juice.  Increase your intake of dietary fiber to 20 to 30 grams per day. Insoluble fiber may help lower your risk of heart disease and may help curb your appetite.  Soluble fiber binds cholesterol to be removed from the blood. Foods high in soluble fiber are dried beans, citrus fruits, oats, apples, bananas, broccoli, Brussels sprouts, and eggplant.  Try to include foods fortified with plant sterols or stanols, such as yogurt, breads, juices, or margarines. Choose several fortified foods to achieve a daily intake of 2 to 3 grams of plant sterols or stanols.  Foods with omega-3 fats can help reduce your risk of heart disease. Aim to have a 3.5 oz portion of fatty fish twice per week, such as salmon, mackerel, albacore tuna, sardines, lake trout, or herring. If you wish to take a fish oil supplement, choose one that contains 1 gram of both DHA and  EPA.  Limit processed meats to 2 servings (3 oz portion) weekly.  Limit the sodium in your diet to 1500 milligrams (mg) per day. If you have high blood pressure, talk to a registered dietitian about a DASH (Dietary Approaches to Stop Hypertension) eating plan.  Limit sweets and beverages with added sugar, such as soda, to no more than 5 servings per week. One serving is:   1 tablespoon sugar.  1 tablespoon jelly or jam.    cup sorbet.  1 cup lemonade.   cup regular soda. CHOOSING FOODS Starches  Allowed: Breads: All kinds (wheat, rye, raisin, white, oatmeal, [place], [place], and English muffin bread). Low-fat rolls: English muffins, frankfurter and hamburger buns, bagels, pita bread, tortillas (not fried). Pancakes, waffles, biscuits, and muffins made with recommended oil.  Avoid: Products made with saturated or trans fats, oils, or whole milk products. Butter rolls, cheese breads, croissants. Commercial doughnuts, muffins, sweet rolls, biscuits, waffles, pancakes, store-bought mixes. Crackers  Allowed: Low-fat crackers and snacks: Animal, graham, rye, saltine (with recommended oil, no lard), oyster, and matzo crackers. Bread sticks, melba toast, rusks, flatbread, pretzels, and light popcorn.  Avoid: High-fat crackers: cheese crackers, butter crackers, and those made with coconut, palm oil, or trans fat (hydrogenated oils). Buttered popcorn. Cereals  Allowed: Hot or cold whole-grain cereals.  Avoid: Cereals containing coconut, hydrogenated vegetable fat, or animal fat. Potatoes / Pasta / Rice  Allowed: All kinds of potatoes, rice, and pasta (such as macaroni, spaghetti, and noodles).  Avoid: Pasta or rice prepared with cream sauce or high-fat cheese. Chow mein noodles, [place] fries. Vegetables  Allowed: All vegetables and vegetable juices.  Avoid: Fried vegetables. Vegetables in cream, butter, or high-fat cheese sauces. Limit coconut. Fruit in cream or custard. Protein  Allowed: Limit your intake of meat, seafood, and poultry to no more than 6 oz (cooked weight) per day. All lean, well-trimmed beef, veal, pork, and lamb. All chicken and [place] without skin. All fish and shellfish. Wild game: wild duck, rabbit, pheasant, and venison. Egg whites or low-cholesterol egg substitutes may be used as desired. Meatless dishes: recipes with dried beans, peas, lentils, and tofu (soybean curd). Seeds and nuts:  all seeds and most nuts.  Avoid: Prime grade and other heavily marbled and fatty meats, such as short ribs, spare ribs, rib eye roast or steak, frankfurters, sausage, bacon, and high-fat luncheon meats, mutton. Caviar. Commercially fried fish. Domestic duck, goose, venison sausage. Organ meats: liver, gizzard, heart, chitterlings, brains, kidney, sweetbreads. Dairy  Allowed: Low-fat cheeses: nonfat or low-fat cottage cheese (1% or 2% fat), cheeses made with part skim milk, such as mozzarella, farmers, string, or ricotta. (Cheeses should be labeled no more than 2 to 6 grams fat per oz.). Skim (or 1%) milk: liquid, powdered, or evaporated. Buttermilk made with low-fat milk. Drinks made with skim or low-fat milk or cocoa. Chocolate milk or cocoa made with skim or low-fat (1%) milk. Nonfat or low-fat yogurt.  Avoid: Whole milk cheeses, including col[place], cheddar, [place], Monte[place] Jack, [place], [place], [place], American, Swiss, and blue. Creamed cottage cheese, cream cheese. Whole milk and whole milk products, including buttermilk or yogurt made from whole milk, drinks made from whole milk. Condensed milk, evaporated whole milk, and 2% milk. Soups and Combination Foods  Allowed: Low-fat low-sodium soups: broth, dehydrated soups, homemade broth, soups with the fat removed, homemade cream soups made with skim or low-fat milk. Low-fat spaghetti, lasagna, chili, and Spanish rice if low-fat ingredients and low-fat cooking techniques are used.  Avoid:  Cream soups made with whole milk, cream, or high-fat cheese. All other soups. Desserts and Sweets  Allowed: Sherbet, fruit ices, gelatins, meringues, and angel food cake. Homemade desserts with recommended fats, oils, and milk products. Jam, jelly, honey, marmalade, sugars, and syrups. Pure sugar candy, such as gum drops, hard candy, jelly beans, marshmallows, mints, and small amounts of dark chocolate.  Avoid: Commercially prepared cakes, pies, cookies,  frosting, pudding, or mixes for these products. Desserts containing whole milk products, chocolate, coconut, lard, palm oil, or palm kernel oil. Ice cream or ice cream drinks. Candy that contains chocolate, coconut, butter, hydrogenated fat, or unknown ingredients. Buttered syrups. Fats and Oils  Allowed: Vegetable oils: safflower, sunflower, corn, soybean, cottonseed, sesame, canola, olive, or peanut. Non-hydrogenated margarines. Salad dressing or mayonnaise: homemade or commercial, made with a recommended oil. Low or nonfat salad dressing or mayonnaise.  Limit added fats and oils to 6 to 8 tsp per day (includes fats used in cooking, baking, salads, and spreads on bread). Remember to count the "hidden fats" in foods.  Avoid: Solid fats and shortenings: butter, lard, salt pork, bacon drippings. Gravy containing meat fat, shortening, or suet. Cocoa butter, coconut. Coconut oil, palm oil, palm kernel oil, or hydrogenated oils: these ingredients are often used in bakery products, nondairy creamers, whipped toppings, candy, and commercially fried foods. Read labels carefully. Salad dressings made of unknown oils, sour cream, or cheese, such as blue cheese and Ro[place]ef[place]. Cream, all kinds: half-and-half, light, heavy, or whipping. Sour cream or cream cheese (even if "light" or low-fat). Nondairy cream substitutes: coffee creamers and sour cream substitutes made with palm, palm kernel, hydrogenated oils, or coconut oil. Beverages  Allowed: Coffee (regular or decaffeinated), tea. Diet carbonated beverages, mineral water. Alcohol: Check with your caregiver. Moderation is recommended.  Avoid: Whole milk, regular sodas, and juice drinks with added sugar. Condiments  Allowed: All seasonings and condiments. Cocoa powder. "Cream" sauces made with recommended ingredients.  Avoid: Carob powder made with hydrogenated fats. SAMPLE MENU Breakfast   cup orange juice   cup oatmeal  1 slice toast  1 tsp  margarine  1 cup skim milk Lunch  [place] sandwich with 2 oz [place], 2 slices bread  Lettuce and tomato slices  Fresh fruit  Carrot sticks  Coffee or tea Snack  Fresh fruit or low-fat crackers Dinner  3 oz lean ground beef  1 baked potato  1 tsp margarine   cup asparagus  Lettuce salad  1 tbs non-creamy dressing   cup peach slices  1 cup skim milk Document Released: 02/06/2008 Document Revised: 10/29/2011 Document Reviewed: 06/29/2013 Exit[employer] Patient Information 2015 [place], [place]. This information is not intended to replace advice given to you by your health care provider. Make sure you discuss any questions you have with your health care provider.

## 2014-10-03 ENCOUNTER — Ambulatory Visit (INDEPENDENT_AMBULATORY_CARE_PROVIDER_SITE_OTHER): Payer: Medicare Other

## 2014-10-03 DIAGNOSIS — J309 Allergic rhinitis, unspecified: Secondary | ICD-10-CM

## 2014-10-17 ENCOUNTER — Ambulatory Visit (INDEPENDENT_AMBULATORY_CARE_PROVIDER_SITE_OTHER): Payer: Medicare Other

## 2014-10-17 DIAGNOSIS — J309 Allergic rhinitis, unspecified: Secondary | ICD-10-CM

## 2014-10-24 ENCOUNTER — Ambulatory Visit (INDEPENDENT_AMBULATORY_CARE_PROVIDER_SITE_OTHER): Payer: Medicare Other

## 2014-10-24 DIAGNOSIS — J309 Allergic rhinitis, unspecified: Secondary | ICD-10-CM | POA: Diagnosis not present

## 2014-10-31 ENCOUNTER — Ambulatory Visit (INDEPENDENT_AMBULATORY_CARE_PROVIDER_SITE_OTHER): Payer: Medicare Other

## 2014-10-31 DIAGNOSIS — J309 Allergic rhinitis, unspecified: Secondary | ICD-10-CM

## 2014-11-07 ENCOUNTER — Ambulatory Visit (INDEPENDENT_AMBULATORY_CARE_PROVIDER_SITE_OTHER): Payer: Medicare Other

## 2014-11-07 DIAGNOSIS — J309 Allergic rhinitis, unspecified: Secondary | ICD-10-CM

## 2014-11-15 ENCOUNTER — Ambulatory Visit (INDEPENDENT_AMBULATORY_CARE_PROVIDER_SITE_OTHER): Payer: Medicare Other

## 2014-11-15 DIAGNOSIS — J309 Allergic rhinitis, unspecified: Secondary | ICD-10-CM | POA: Diagnosis not present

## 2014-11-21 ENCOUNTER — Ambulatory Visit (INDEPENDENT_AMBULATORY_CARE_PROVIDER_SITE_OTHER): Payer: Medicare Other

## 2014-11-21 DIAGNOSIS — J309 Allergic rhinitis, unspecified: Secondary | ICD-10-CM | POA: Diagnosis not present

## 2014-11-25 NOTE — Assessment & Plan Note (Signed)
She is satisfied to continue allergy vaccine 1:10

## 2014-11-25 NOTE — Assessment & Plan Note (Signed)
Cautioned to follow basic reflux control measures

## 2014-11-25 NOTE — Assessment & Plan Note (Signed)
She is continuing allergy vaccine. Very satisfactory control with no recent need for inhalers at all.

## 2014-11-28 ENCOUNTER — Ambulatory Visit (INDEPENDENT_AMBULATORY_CARE_PROVIDER_SITE_OTHER): Payer: Medicare Other

## 2014-11-28 DIAGNOSIS — J309 Allergic rhinitis, unspecified: Secondary | ICD-10-CM | POA: Diagnosis not present

## 2014-12-05 ENCOUNTER — Ambulatory Visit (INDEPENDENT_AMBULATORY_CARE_PROVIDER_SITE_OTHER): Payer: Medicare Other

## 2014-12-05 DIAGNOSIS — J309 Allergic rhinitis, unspecified: Secondary | ICD-10-CM | POA: Diagnosis not present

## 2014-12-08 ENCOUNTER — Ambulatory Visit: Payer: Medicare Other

## 2014-12-12 ENCOUNTER — Ambulatory Visit (INDEPENDENT_AMBULATORY_CARE_PROVIDER_SITE_OTHER): Payer: Medicare Other

## 2014-12-12 DIAGNOSIS — J309 Allergic rhinitis, unspecified: Secondary | ICD-10-CM

## 2014-12-14 ENCOUNTER — Telehealth: Payer: Self-pay | Admitting: Internal Medicine

## 2014-12-14 ENCOUNTER — Ambulatory Visit (INDEPENDENT_AMBULATORY_CARE_PROVIDER_SITE_OTHER): Payer: Medicare Other

## 2014-12-14 DIAGNOSIS — J309 Allergic rhinitis, unspecified: Secondary | ICD-10-CM

## 2014-12-14 NOTE — Telephone Encounter (Signed)
Date Mixed: 12/14/14 Vial: 2 Strength: 1:10 Here/Mail/Pick Up: here Mixed By: tbs

## 2014-12-19 ENCOUNTER — Ambulatory Visit (INDEPENDENT_AMBULATORY_CARE_PROVIDER_SITE_OTHER): Payer: Medicare Other

## 2014-12-19 DIAGNOSIS — J309 Allergic rhinitis, unspecified: Secondary | ICD-10-CM

## 2014-12-22 ENCOUNTER — Other Ambulatory Visit: Payer: Self-pay

## 2014-12-22 DIAGNOSIS — Z1231 Encounter for screening mammogram for malignant neoplasm of breast: Secondary | ICD-10-CM

## 2014-12-26 ENCOUNTER — Ambulatory Visit (INDEPENDENT_AMBULATORY_CARE_PROVIDER_SITE_OTHER): Payer: Medicare Other

## 2014-12-26 DIAGNOSIS — J309 Allergic rhinitis, unspecified: Secondary | ICD-10-CM

## 2014-12-27 ENCOUNTER — Other Ambulatory Visit: Payer: Medicare Other

## 2014-12-27 DIAGNOSIS — R739 Hyperglycemia, unspecified: Secondary | ICD-10-CM | POA: Diagnosis not present

## 2014-12-27 DIAGNOSIS — I10 Essential (primary) hypertension: Secondary | ICD-10-CM

## 2014-12-28 LAB — BASIC METABOLIC PANEL
BUN/Creatinine Ratio: 24 (ref 11–26)
BUN: 17 mg/dL (ref 8–27)
CALCIUM: 9.4 mg/dL (ref 8.7–10.3)
CHLORIDE: 100 mmol/L (ref 97–108)
CO2: 26 mmol/L (ref 18–29)
Creatinine, Ser: 0.7 mg/dL (ref 0.57–1.00)
GFR, EST AFRICAN AMERICAN: 97 mL/min/{1.73_m2} (ref >59)
GFR, EST NON AFRICAN AMERICAN: 84 mL/min/{1.73_m2} (ref >59)
Glucose: 111 mg/dL — ABNORMAL HIGH (ref 65–99)
Potassium: 4.1 mmol/L (ref 3.5–5.2)
SODIUM: 142 mmol/L (ref 134–144)

## 2014-12-29 ENCOUNTER — Encounter: Payer: Self-pay | Admitting: Internal Medicine

## 2014-12-29 ENCOUNTER — Encounter: Payer: Self-pay | Admitting: Nurse Practitioner

## 2014-12-29 ENCOUNTER — Ambulatory Visit (INDEPENDENT_AMBULATORY_CARE_PROVIDER_SITE_OTHER): Payer: Medicare Other | Admitting: Nurse Practitioner

## 2014-12-29 VITALS — BP 122/68 | HR 71 | Temp 97.4°F | Resp 20 | Ht 63.0 in | Wt 165.6 lb

## 2014-12-29 DIAGNOSIS — J309 Allergic rhinitis, unspecified: Secondary | ICD-10-CM

## 2014-12-29 DIAGNOSIS — E785 Hyperlipidemia, unspecified: Secondary | ICD-10-CM | POA: Diagnosis not present

## 2014-12-29 DIAGNOSIS — R739 Hyperglycemia, unspecified: Secondary | ICD-10-CM | POA: Diagnosis not present

## 2014-12-29 DIAGNOSIS — I10 Essential (primary) hypertension: Secondary | ICD-10-CM | POA: Diagnosis not present

## 2014-12-29 LAB — SPECIMEN STATUS REPORT

## 2014-12-29 NOTE — Progress Notes (Signed)
Patient ID: [name], female   DOB: 12-27-1940, 75 y.o.   MRN: [id number]    PCP: Lauree Chandler, NP  Allergies  Allergen Reactions  . Ace Inhibitors   . Amoxicillin     REACTION: unknown? pain in right kidney  . Benicar Hct [Olmesartan Medoxomil-Hctz]     Extreme weakness  . Budesonide-Formoterol Fumarate     Causes tremors and numbness  . Chlorzoxazone [Chlorzoxazone]   . Citalopram Hydrobromide     REACTION: hives  . Citalopram Hydrobromide   . Clonidine Derivatives   . Droperidol     REACTION: hives  . Flexeril [Cyclobenzaprine Hcl]   . Ketorolac Tromethamine     REACTION: hives  . Ketorolac Tromethamine   . Metoclopramide Hcl   . Mometasone Furo-Formoterol Fum     Causes sore throat, blurred vision and unable to sleep--started on med 09-17-10  . Morphine     REACTION: hives and itching  . Olmesartan Medoxomil     REACTION: fatique    Chief Complaint  Patient presents with  . Medical Management of Chronic Issues     HPI: Patient is a 74 y.o. female seen in the office today for routine follow up on chronic conditions. Pt with a pmh of GERD, HTN, vertigo, allergic rhinitis and asthma. Pt with ongoing weight gain. Blood sugar remains elevated on BMP.  Started having worsening GERD due to over eating. Has had poor diet. Stopped taking fish oil due to acid and has also improved.  Taking famotidine which has helped.  Coughing a little bit more, famotine has also helped that  Goes to the gym 3 days a week and stays about 1 hour.   Was having depression but started going back to church and reading and now is much better. Feeling very peaceful and upbeat since.   Advanced Directive information Does patient have an advance directive?: No, Would patient like information on creating an advanced directive?: Yes - Educational materials given Review of Systems:  Review of Systems  Constitutional: Negative for fever and chills.  HENT: Positive for hearing loss.  Negative for congestion, ear pain, sore throat and tinnitus.   Eyes:       Cataracts- sees eye MD  Respiratory: Negative for cough, shortness of breath and wheezing.   Cardiovascular: Negative for chest pain and palpitations.  Gastrointestinal: Negative for abdominal pain, diarrhea, constipation and blood in stool.  Genitourinary: Negative for dysuria.  Musculoskeletal: Negative for myalgias.  Skin: Negative for rash.  Neurological: Negative for weakness and headaches.  Psychiatric/Behavioral: Negative for suicidal ideas. The patient is nervous/anxious (would not want medications).     Past Medical History  Diagnosis Date  . ALLERGIC RHINITIS   . Hypertension   . Asthma   . Hypercholesterolemia     borderline  . GERD (gastroesophageal reflux disease)   . Diverticulosis of colon   . IBS (irritable bowel syndrome)   . Colon polyp   . History of nephrolithiasis   . Osteoarthritis   . Fibromyalgia   . Vitamin D deficiency   . Postconcussion syndrome   . Cervical strain, acute   . Dizziness   . Anxiety   . Personal history of allergy to unspecified medicinal agent   . Blood type O+    Past Surgical History  Procedure Laterality Date  . Cholecystectomy, laparoscopic  2004    for gallstone pancreatitis  . Thyroid surgery    . Knee surgery Right   . Knee surgery Left  Social History:   reports that she quit smoking about 52 years ago. Her smoking use included Cigarettes. She has a 16 pack-year smoking history. She has never used smokeless tobacco. She reports that she does not drink alcohol or use illicit drugs.  Family History  Problem Relation Age of Onset  . Breast cancer Mother   . Alzheimer's disease Mother   . Heart disease Mother   . Ovarian cancer Paternal Grandmother   . Arthritis Other     Cousins   . COPD Brother   . COPD Cousin     Maternal side   . Heart attack Maternal Uncle   . Heart disease Sister   . Alcohol abuse Sister      Medications: Patient's Medications  New Prescriptions   No medications on file  Previous Medications   ACETAMINOPHEN (TYLENOL) 500 MG TABLET    Take 500 mg by mouth every 6 (six) hours as needed (for headache as needed).    ALBUTEROL (PROVENTIL HFA;VENTOLIN HFA) 108 (90 BASE) MCG/ACT INHALER    Inhale 2 puffs into the lungs every 4 (four) hours as needed for wheezing or shortness of breath. RESCUE   AMLODIPINE (NORVASC) 5 MG TABLET    TAKE 1 TABLET BY MOUTH DAILY TO CONTROL BLOOD PRESSURE   ASPIRIN 81 MG EC TABLET    Take 81 mg by mouth daily.     CETIRIZINE HCL (ZYRTEC ALLERGY) 10 MG CAPS    Take 1 capsule by mouth daily.     CHOLECALCIFEROL (VITAMIN D3) 1000 UNITS CAPS    Take by mouth daily.     FAMOTIDINE (PEPCID) 20 MG TABLET    Take 20 mg by mouth 2 (two) times daily.   FLUTICASONE (FLONASE) 50 MCG/ACT NASAL SPRAY    USE 1-2 SPRAYS IN EACH NOSTRIL TWICE A DAY   FLUTICASONE-SALMETEROL (ADVAIR DISKUS) 250-50 MCG/DOSE AEPB    1 puff then rinse mouth, twice daily   HYDROCHLOROTHIAZIDE (MICROZIDE) 12.5 MG CAPSULE    TAKE ONE CAPSULE BY MOUTH EVERY DAY   INJECTION DEVICE MISC    by Does not apply route. Allergy injection once every Monday, Administered by Dr.Young   MECLIZINE (ANTIVERT) 25 MG TABLET    TAKE 1 TABLET 3 TIMES A DAY AS NEEDED FOR DIZZINESS/NAUSEA   MULTIPLE VITAMINS-MINERALS (CENTRUM SILVER PO)    Take by mouth daily.     OMEGA-3 FATTY ACIDS (FISH OIL MAXIMUM STRENGTH) 1200 MG CAPS    Take by mouth. 2 by mouth daily   PANTOPRAZOLE (PROTONIX) 40 MG TABLET    TAKE 1 TABLET BY MOUTH EVERY DAY   POTASSIUM CHLORIDE SA (K-DUR,KLOR-CON) 20 MEQ TABLET    TAKE 1 TABLET BY MOUTH DAILY   PROPYLENE GLYCOL (SYSTANE BALANCE) 0.6 % SOLN    Place 2 drops into both eyes 2 (two) times daily.     SALINE NASAL SPRAY NA    Place 88 mcg into the nose 2 (two) times daily.  Modified Medications   No medications on file  Discontinued Medications   No medications on file     Physical  Exam:  Filed Vitals:   12/29/14 1031  BP: 122/68  Pulse: 71  Temp: 97.4 F (36.3 C)  TempSrc: Oral  Resp: 20  Height: 5' 3"  (1.6 m)  Weight: 165 lb 9.6 oz (75.116 kg)  SpO2: 93%    Physical Exam  Constitutional: She is oriented to person, place, and time. She appears well-developed and well-nourished. No distress.  Neck: Normal range  of motion. Neck supple.  Cardiovascular: Normal rate, regular rhythm and normal heart sounds.   Pulmonary/Chest: Effort normal and breath sounds normal.  Abdominal: Soft. Bowel sounds are normal.  Musculoskeletal: She exhibits no edema or tenderness.  Neurological: She is alert and oriented to person, place, and time.  Skin: Skin is warm and dry. She is not diaphoretic.  Psychiatric: She has a normal mood and affect.    Labs reviewed: Basic Metabolic Panel:  Recent Labs  01/05/14 1625  05/24/14 1135 09/23/14 0809 12/27/14 1011  NA  --   < > 139 142 142  K  --   < > 4.1 3.5 4.1  CL  --   < > 99 100 100  CO2  --   --  28 27 26   GLUCOSE  --   < > 71 105* 111*  BUN  --   < > 15 18 17   CREATININE  --   < > 0.68 0.63 0.70  CALCIUM  --   --  9.3 9.4 9.4  TSH 1.710  --  2.250 3.080  --   < > = values in this interval not displayed. Liver Function Tests:  Recent Labs  05/24/14 1135 09/23/14 0809  AST 22 22  ALT 16 15  ALKPHOS 107 97  BILITOT 0.3 0.5  PROT 6.4 6.5   No results for input(s): LIPASE, AMYLASE in the last 8760 hours. No results for input(s): AMMONIA in the last 8760 hours. CBC:  Recent Labs  05/12/14 1019 05/24/14 1135 09/23/14 0809  WBC  --  6.5 7.0  NEUTROABS  --  2.9 2.6  HGB 15.3* 13.8  --   HCT 45.0 40.2 41.1  MCV  --  87  --    Lipid Panel:  Recent Labs  03/25/14 1043 09/23/14 0809  CHOL 224* 222*  HDL 67 63  LDLCALC 142* 134*  TRIG 73 126  CHOLHDL 3.3 3.5   TSH:  Recent Labs  01/05/14 1625 05/24/14 1135 09/23/14 0809  TSH 1.710 2.250 3.080   A1C: No results found for:  HGBA1C   Assessment/Plan 1. Essential hypertension -blood pressure stable, cont on norvasc 5 mg daily and HCTZ 12.5   2. Hyperglycemia -blood sugar remains elevated on fasting labs, will follow up hgb A1c, discussed diabetic diet and need for lifestyle changes with diet.  - Hemoglobin A1c  3. Allergic rhinitis, unspecified allergic rhinitis type -remains stable, conts current regimen   4. Hyperlipidemia -LDL elevated on labs 3 months, diet and exercise reinforced -to cont on fish oil -will follow up before next visit  - Comprehensive metabolic panel; Future - Lipid panel; Future - CBC with Differential; Future  Follow up in 3 months for EV with lab work prior to appt, will need MMSE  Elynn Patteson K. Harle Battiest  [facility] [phone] 8 am - 5 pm) [phone] (after hours)

## 2014-12-29 NOTE — Patient Instructions (Signed)
You are considered pre-diabetic -- need to cut back on sugars, desserts, sugary drinks  avoid sweets and to cut back on the amount of carbohydrates you eat (starchy foods such as flour, bread, and potatoes), avoid high fat dairy (like ice cream, whole milk, cheeses), can buy a heart healthy cook book from the american heart association to help with meal planning.    Follow up in 3 months for annual wellness exam with lab work before you are seen

## 2014-12-30 ENCOUNTER — Ambulatory Visit
Admission: RE | Admit: 2014-12-30 | Discharge: 2014-12-30 | Disposition: A | Discharge status: Home / Self Care | Payer: Medicare Other | Source: Ambulatory Visit

## 2014-12-30 DIAGNOSIS — Z1231 Encounter for screening mammogram for malignant neoplasm of breast: Secondary | ICD-10-CM

## 2014-12-30 LAB — HEMOGLOBIN A1C
Est. average glucose Bld gHb Est-mCnc: 114 mg/dL
Hgb A1c MFr Bld: 5.6 % (ref 4.8–5.6)

## 2015-01-02 ENCOUNTER — Ambulatory Visit (INDEPENDENT_AMBULATORY_CARE_PROVIDER_SITE_OTHER): Payer: Medicare Other

## 2015-01-02 DIAGNOSIS — J309 Allergic rhinitis, unspecified: Secondary | ICD-10-CM

## 2015-01-09 ENCOUNTER — Ambulatory Visit (INDEPENDENT_AMBULATORY_CARE_PROVIDER_SITE_OTHER): Payer: Medicare Other

## 2015-01-09 DIAGNOSIS — J309 Allergic rhinitis, unspecified: Secondary | ICD-10-CM

## 2015-01-17 ENCOUNTER — Ambulatory Visit (INDEPENDENT_AMBULATORY_CARE_PROVIDER_SITE_OTHER): Payer: Medicare Other

## 2015-01-17 DIAGNOSIS — J309 Allergic rhinitis, unspecified: Secondary | ICD-10-CM

## 2015-01-23 ENCOUNTER — Other Ambulatory Visit: Payer: Self-pay | Admitting: Internal Medicine

## 2015-01-23 ENCOUNTER — Ambulatory Visit (INDEPENDENT_AMBULATORY_CARE_PROVIDER_SITE_OTHER): Payer: Medicare Other

## 2015-01-23 ENCOUNTER — Other Ambulatory Visit: Payer: Self-pay | Admitting: Nurse Practitioner

## 2015-01-23 DIAGNOSIS — J309 Allergic rhinitis, unspecified: Secondary | ICD-10-CM | POA: Diagnosis not present

## 2015-01-27 ENCOUNTER — Encounter (HOSPITAL_COMMUNITY): Payer: Self-pay | Admitting: Family Medicine

## 2015-01-27 ENCOUNTER — Telehealth: Payer: Self-pay | Admitting: *Deleted

## 2015-01-27 ENCOUNTER — Emergency Department (INDEPENDENT_AMBULATORY_CARE_PROVIDER_SITE_OTHER)
Admission: EM | Admit: 2015-01-27 | Discharge: 2015-01-27 | Disposition: A | Discharge status: Home / Self Care | Payer: Medicare Other | Source: Home / Self Care | Attending: Family Medicine | Admitting: Family Medicine

## 2015-01-27 DIAGNOSIS — J40 Bronchitis, not specified as acute or chronic: Secondary | ICD-10-CM | POA: Diagnosis not present

## 2015-01-27 DIAGNOSIS — J324 Chronic pansinusitis: Secondary | ICD-10-CM | POA: Diagnosis not present

## 2015-01-27 MED ORDER — PREDNISONE 20 MG PO TABS
60.0000 mg | ORAL_TABLET | Freq: Once | ORAL | Status: AC
  Administered 2015-01-27: 60 mg via ORAL

## 2015-01-27 MED ORDER — FLUCONAZOLE 150 MG PO TABS: 150.0000 mg | ORAL_TABLET | Freq: Every day | ORAL | Status: DC

## 2015-01-27 MED ORDER — BENZONATATE 100 MG PO CAPS: 100.0000 mg | ORAL_CAPSULE | Freq: Three times a day (TID) | ORAL | Status: DC | PRN

## 2015-01-27 MED ORDER — IPRATROPIUM-ALBUTEROL 0.5-2.5 (3) MG/3ML IN SOLN
3.0000 mL | Freq: Once | RESPIRATORY_TRACT | Status: AC
  Administered 2015-01-27: 3 mL via RESPIRATORY_TRACT

## 2015-01-27 MED ORDER — PREDNISONE 20 MG PO TABS: 60.0000 mg | ORAL_TABLET | Freq: Once | ORAL | Status: DC

## 2015-01-27 MED ORDER — DOXYCYCLINE HYCLATE 100 MG PO TABS: 100.0000 mg | ORAL_TABLET | Freq: Two times a day (BID) | ORAL | Status: DC

## 2015-01-27 MED ORDER — PREDNISONE 20 MG PO TABS
ORAL_TABLET | ORAL | Status: AC
  Filled 2015-01-27: qty 3

## 2015-01-27 MED ORDER — ALBUTEROL SULFATE (2.5 MG/3ML) 0.083% IN NEBU
2.5000 mg | INHALATION_SOLUTION | Freq: Once | RESPIRATORY_TRACT | Status: AC
  Administered 2015-01-27: 2.5 mg via RESPIRATORY_TRACT

## 2015-01-27 MED ORDER — ALBUTEROL SULFATE (2.5 MG/3ML) 0.083% IN NEBU
INHALATION_SOLUTION | RESPIRATORY_TRACT | Status: AC
  Filled 2015-01-27: qty 3

## 2015-01-27 MED ORDER — IPRATROPIUM-ALBUTEROL 0.5-2.5 (3) MG/3ML IN SOLN
RESPIRATORY_TRACT | Status: AC
  Filled 2015-01-27: qty 3

## 2015-01-27 NOTE — Telephone Encounter (Signed)
Patient called and stated that she is having SOB and Fatigue. She stated that it is hard to catch her breath and I could hear it while speaking with the patient. Informed patient to go to Urgent Care to be evaluated. Patient agreed.

## 2015-01-27 NOTE — Discharge Instructions (Signed)
You are suffering from a sinus infection and bronchospasm and bronchitis. Your given a breathing treatment to help with her symptoms as well as a dose of steroids to start the process. Please take your steroid every morning with breakfast. Please use your Ventolin every 4 hours for the next 24 hours. Please start the antibiotic called doxycycline to help with infection. Please take the yeast pill she developed a yeast infection. Please use the Tessalon Perles for additional cough relief. Please go to the emergency room if his summers get significantly worse. There is evidence of this being related to your heart today or of any sign of pneumonia.

## 2015-01-27 NOTE — ED Notes (Signed)
C/o SOB onset 4 weeks associated w/fatigue and rattling See physicians note Alert and oriented x4... No acute distress.

## 2015-01-27 NOTE — ED Provider Notes (Signed)
CSN: [id number]     Arrival date & time 01/27/15  1307 History   None    Chief Complaint  Patient presents with  . URI   (Consider location/radiation/quality/duration/timing/severity/associated sxs/prior Treatment) HPI 4 wks of cough and congestion. Becoming more SOB and fatigued.  Inh w/ some improvement.  Palms are intermittent. Cough is nonproductive. Worse with significant ambulation. No fevers.  Now with sinus pain and pressure and increased nasal and sinus discharge. Cine chest pain, palpitations, neck stiffness, headache, rash, pain, lower extremity swelling.    Past Medical History  Diagnosis Date  . ALLERGIC RHINITIS   . Hypertension   . Asthma   . Hypercholesterolemia     borderline  . GERD (gastroesophageal reflux disease)   . Diverticulosis of colon   . IBS (irritable bowel syndrome)   . Colon polyp   . History of nephrolithiasis   . Osteoarthritis   . Fibromyalgia   . Vitamin D deficiency   . Postconcussion syndrome   . Cervical strain, acute   . Dizziness   . Anxiety   . Personal history of allergy to unspecified medicinal agent   . Blood type O+    Past Surgical History  Procedure Laterality Date  . Cholecystectomy, laparoscopic  2004    for gallstone pancreatitis  . Thyroid surgery    . Knee surgery Right   . Knee surgery Left    Family History  Problem Relation Age of Onset  . Breast cancer Mother   . Alzheimer's disease Mother   . Heart disease Mother   . Ovarian cancer Paternal Grandmother   . Arthritis Other     Cousins   . COPD Brother   . COPD Cousin     Maternal side   . Heart attack Maternal Uncle   . Heart disease Sister   . Alcohol abuse Sister    Social History  Substance Use Topics  . Smoking status: Former Smoker -- 2.00 packs/day for 8 years    Types: Cigarettes    Quit date: 05/13/1962  . Smokeless tobacco: Never Used  . Alcohol Use: No   OB History    No data available     Review of Systems Per HPI with all  other pertinent systems negative.   Allergies  Ace inhibitors; Amoxicillin; Benicar hct; Budesonide-formoterol fumarate; Chlorzoxazone; Citalopram hydrobromide; Citalopram hydrobromide; Clonidine derivatives; Droperidol; Flexeril; Ketorolac tromethamine; Ketorolac tromethamine; Metoclopramide hcl; Mometasone furo-formoterol fum; Morphine; and Olmesartan medoxomil  Home Medications   Prior to Admission medications   Medication Sig Start Date End Date Taking? Authorizing Provider  acetaminophen (TYLENOL) 500 MG tablet Take 500 mg by mouth every 6 (six) hours as needed (for headache as needed).    Yes Historical Provider, MD  albuterol (PROVENTIL HFA;VENTOLIN HFA) 108 (90 BASE) MCG/ACT inhaler Inhale 2 puffs into the lungs every 4 (four) hours as needed for wheezing or shortness of breath. RESCUE 02/21/14 03/29/16 Yes Clinton D Young, MD  amLODipine (NORVASC) 5 MG tablet TAKE 1 TABLET BY MOUTH DAILY TO CONTROL BLOOD PRESSURE 08/24/14  Yes Mahima Pandey, MD  aspirin 81 MG EC tablet Take 81 mg by mouth daily.     Yes Historical Provider, MD  Cetirizine HCl (ZYRTEC ALLERGY) 10 MG CAPS Take 1 capsule by mouth daily.     Yes Historical Provider, MD  Cholecalciferol (VITAMIN D3) 1000 UNITS CAPS Take by mouth daily.     Yes Historical Provider, MD  famotidine (PEPCID) 20 MG tablet Take 20 mg by mouth 2 (  two) times daily.   Yes Historical Provider, MD  fluticasone (FLONASE) 50 MCG/ACT nasal spray USE 1-2 SPRAYS IN EACH NOSTRIL TWICE A DAY 02/21/14  Yes Deneise Lever, MD  hydrochlorothiazide (MICROZIDE) 12.5 MG capsule TAKE 1 CAPSULE BY MOUTH EVERY DAY 01/24/15  Yes Lauree Chandler, NP  Multiple Vitamins-Minerals (CENTRUM SILVER PO) Take by mouth daily.     Yes Historical Provider, MD  Omega-3 Fatty Acids (FISH OIL MAXIMUM STRENGTH) 1200 MG CAPS Take by mouth. 2 by mouth daily   Yes Historical Provider, MD  pantoprazole (PROTONIX) 40 MG tablet TAKE 1 TABLET BY MOUTH EVERY DAY 01/24/15  Yes Lauree Chandler, NP  potassium chloride SA (K-DUR,KLOR-CON) 20 MEQ tablet TAKE 1 TABLET BY MOUTH DAILY 01/24/15  Yes Lauree Chandler, NP  benzonatate (TESSALON PERLES) 100 MG capsule Take 1-2 capsules (100-200 mg total) by mouth 3 (three) times daily as needed for cough. 01/27/15   Waldemar Dickens, MD  doxycycline (VIBRA-TABS) 100 MG tablet Take 1 tablet (100 mg total) by mouth 2 (two) times daily. 01/27/15   Waldemar Dickens, MD  fluconazole (DIFLUCAN) 150 MG tablet Take 1 tablet (150 mg total) by mouth daily. Repeat dose in 3 days 01/27/15   Waldemar Dickens, MD  Fluticasone-Salmeterol (ADVAIR DISKUS) 250-50 MCG/DOSE AEPB 1 puff then rinse mouth, twice daily 02/21/14   Deneise Lever, MD  Injection Device MISC by Does not apply route. Allergy injection once every Monday, Administered by Dr.Young    Historical Provider, MD  meclizine (ANTIVERT) 25 MG tablet TAKE 1 TABLET 3 TIMES A DAY AS NEEDED FOR DIZZINESS/NAUSEA Patient not taking: Reported on 12/29/2014 08/20/12   Noralee Space, MD  predniSONE (DELTASONE) 20 MG tablet Take 3 tablets (60 mg total) by mouth once. 01/27/15   Waldemar Dickens, MD  Propylene Glycol (SYSTANE BALANCE) 0.6 % SOLN Place 2 drops into both eyes 2 (two) times daily.      Historical Provider, MD  SALINE NASAL SPRAY NA Place 88 mcg into the nose 2 (two) times daily.    Historical Provider, MD   Meds Ordered and Administered this Visit   Medications  ipratropium-albuterol (DUONEB) 0.5-2.5 (3) MG/3ML nebulizer solution 3 mL (not administered)  albuterol (PROVENTIL) (2.5 MG/3ML) 0.083% nebulizer solution 2.5 mg (not administered)  predniSONE (DELTASONE) tablet 60 mg (not administered)    BP 173/70 mmHg  Pulse 86  Temp(Src) 98 F (36.7 C) (Oral)  Resp 18  SpO2 97% No data found.   Physical Exam Physical Exam  Constitutional: oriented to person, place, and time. appears well-developed and well-nourished. No distress.  HENT:  Head: Normocephalic and atraumatic.  Maxillary  frontal sinuses tender to palpation Eyes: EOMI. PERRL.  Neck: Normal range of motion.  Cardiovascular: RRR, no m/r/g, 2+ distal pulses,  Pulmonary/Chest: Normal respiratory effort, dry cough appreciated, no wheezing but tight lung sounds throughout.  Abdominal: Soft. Bowel sounds are normal. NonTTP, no distension.  Musculoskeletal: Normal range of motion. Non ttp, no effusion.  Neurological: alert and oriented to person, place, and time.  Skin: Skin is warm. No rash noted. non diaphoretic.  Psychiatric: normal mood and affect. behavior is normal. Judgment and thought content normal.    ED Course  Procedures (including critical care time)  Labs Review Labs Reviewed - No data to display  Imaging Review No results found.   Visual Acuity Review  Right Eye Distance:   Left Eye Distance:   Bilateral Distance:    Right Eye  Near:   Left Eye Near:    Bilateral Near:         MDM   1. Bronchitis   2. Pansinusitis, unspecified chronicity    Albuterol 5 mg ipratropium 5 mg nebulizer treatments provided. Prednisone 60 mg given by mouth in clinic. Patient start prednisone 60 mg daily times a day 4 days, use of her Ventolin every 4 hours for the next 24 hours, start doxycycline, and use Diflucan if she develops yeast infection. Patient to the ED if she gets worse. No evidence of pneumonia at this point time.    Waldemar Dickens, MD 01/27/15 [phone]

## 2015-01-30 ENCOUNTER — Ambulatory Visit: Payer: Medicare Other

## 2015-02-06 ENCOUNTER — Ambulatory Visit (INDEPENDENT_AMBULATORY_CARE_PROVIDER_SITE_OTHER): Payer: Medicare Other

## 2015-02-06 DIAGNOSIS — J309 Allergic rhinitis, unspecified: Secondary | ICD-10-CM | POA: Diagnosis not present

## 2015-02-13 ENCOUNTER — Ambulatory Visit: Payer: Medicare Other

## 2015-02-13 ENCOUNTER — Ambulatory Visit (INDEPENDENT_AMBULATORY_CARE_PROVIDER_SITE_OTHER): Payer: Medicare Other | Admitting: Internal Medicine

## 2015-02-13 ENCOUNTER — Encounter: Payer: Self-pay | Admitting: Internal Medicine

## 2015-02-13 VITALS — BP 130/72 | HR 88 | Temp 98.6°F | Wt 172.0 lb

## 2015-02-13 DIAGNOSIS — B9689 Other specified bacterial agents as the cause of diseases classified elsewhere: Secondary | ICD-10-CM

## 2015-02-13 DIAGNOSIS — J4520 Mild intermittent asthma, uncomplicated: Secondary | ICD-10-CM

## 2015-02-13 DIAGNOSIS — A499 Bacterial infection, unspecified: Secondary | ICD-10-CM

## 2015-02-13 DIAGNOSIS — J329 Chronic sinusitis, unspecified: Secondary | ICD-10-CM

## 2015-02-13 MED ORDER — METHYLPREDNISOLONE ACETATE 40 MG/ML IJ SUSP
40.0000 mg | Freq: Once | INTRAMUSCULAR | Status: AC
  Administered 2015-02-13: 40 mg via INTRAMUSCULAR

## 2015-02-13 MED ORDER — DOXYCYCLINE HYCLATE 100 MG PO TABS: 100.0000 mg | ORAL_TABLET | Freq: Two times a day (BID) | ORAL | Status: DC

## 2015-02-13 NOTE — Patient Instructions (Addendum)
You may use albuterol 2 puffs every 4 hours as needed for coughing and wheezing.   Also get some mucinex (NO D or DM) to take twice a day to help with the congestion. Warm humidity.

## 2015-02-13 NOTE — Progress Notes (Signed)
Patient ID: [name], female   DOB: 07-18-1940, 74 y.o.   MRN: [id number]   Location:  [facility]  Goals of Care: Advanced Directive information Does patient have an advance directive?: No, Would patient like information on creating an advanced directive?: Yes - Educational materials given   Chief Complaint  Patient presents with  . Acute Visit    cough, sinus headache, pains in ears. On 01/27/15 went to Urgent Care Dx.  bronchitis, pansinusitis. Given Prednisone 79m for 4 days, Doxycycline. Started getting better until she finished medications    HPI: Patient is a 74y.o.  seen in the office today for an acute visit.  She has a h/o asthmatic bronchitis.  She is on advair and albuterol.  Tends to be triggered by a sinus infection.  Also has hayfever and allergies.  Has a bad spout in fall.  Has been taking allergy shots for the past 3 years which have been very helpful.  Had 4 days of prednisone 619mand doxycycline for 10 days.  Using albuterol just once a day, but knows she could use more (discussed may use 2 puffs every 4 hours).  Mentions that her previous PCP used to give her a steroid shot when she had an asthma exacerbation  Had a bad spell of vertigo two days ago helped with meclizine.     Review of Systems:  Review of Systems  Constitutional: Negative for fever and chills.  HENT: Positive for congestion and ear pain. Negative for sore throat.   Eyes: Negative for blurred vision.  Respiratory: Positive for cough, sputum production and wheezing. Negative for shortness of breath.   Cardiovascular: Negative for chest pain.  Gastrointestinal: Negative for diarrhea.  Genitourinary:       No yeast infections  Musculoskeletal: Negative for falls.  Neurological: Positive for headaches.    Past Medical History  Diagnosis Date  . ALLERGIC RHINITIS   . Hypertension   . Asthma   . Hypercholesterolemia     borderline  . GERD  (gastroesophageal reflux disease)   . Diverticulosis of colon   . IBS (irritable bowel syndrome)   . Colon polyp   . History of nephrolithiasis   . Osteoarthritis   . Fibromyalgia   . Vitamin D deficiency   . Postconcussion syndrome   . Cervical strain, acute   . Dizziness   . Anxiety   . Personal history of allergy to unspecified medicinal agent   . Blood type O+     Past Surgical History  Procedure Laterality Date  . Cholecystectomy, laparoscopic  2004    for gallstone pancreatitis  . Thyroid surgery    . Knee surgery Right   . Knee surgery Left     Allergies  Allergen Reactions  . Ace Inhibitors   . Amoxicillin     REACTION: unknown? pain in right kidney  . Benicar Hct [Olmesartan Medoxomil-Hctz]     Extreme weakness  . Budesonide-Formoterol Fumarate     Causes tremors and numbness  . Chlorzoxazone [Chlorzoxazone]   . Citalopram Hydrobromide     REACTION: hives  . Citalopram Hydrobromide   . Clonidine Derivatives   . Droperidol     REACTION: hives  . Flexeril [Cyclobenzaprine Hcl]   . Ketorolac Tromethamine     REACTION: hives  . Ketorolac Tromethamine   . Metoclopramide Hcl   . Mometasone Furo-Formoterol Fum     Causes sore throat, blurred vision and unable to sleep--started  on med 09-17-10  . Morphine     REACTION: hives and itching  . Olmesartan Medoxomil     REACTION: fatique   Medications: Patient's Medications  New Prescriptions   No medications on file  Previous Medications   ACETAMINOPHEN (TYLENOL) 500 MG TABLET    Take 500 mg by mouth every 6 (six) hours as needed (for headache as needed).    ALBUTEROL (PROVENTIL HFA;VENTOLIN HFA) 108 (90 BASE) MCG/ACT INHALER    Inhale 2 puffs into the lungs every 4 (four) hours as needed for wheezing or shortness of breath. RESCUE   AMLODIPINE (NORVASC) 5 MG TABLET    TAKE 1 TABLET BY MOUTH DAILY TO CONTROL BLOOD PRESSURE   ASPIRIN 81 MG EC TABLET    Take 81 mg by mouth daily.     CETIRIZINE HCL (ZYRTEC  ALLERGY) 10 MG CAPS    Take 1 capsule by mouth daily.     CHOLECALCIFEROL (VITAMIN D3) 1000 UNITS CAPS    Take by mouth daily.     FAMOTIDINE (PEPCID) 20 MG TABLET    Take 20 mg by mouth 2 (two) times daily.   FLUTICASONE (FLONASE) 50 MCG/ACT NASAL SPRAY    USE 1-2 SPRAYS IN EACH NOSTRIL TWICE A DAY   FLUTICASONE-SALMETEROL (ADVAIR DISKUS) 250-50 MCG/DOSE AEPB    1 puff then rinse mouth, twice daily   HYDROCHLOROTHIAZIDE (MICROZIDE) 12.5 MG CAPSULE    TAKE 1 CAPSULE BY MOUTH EVERY DAY   INJECTION DEVICE MISC    by Does not apply route. Allergy injection once every Monday, Administered by Dr.Young   MECLIZINE (ANTIVERT) 25 MG TABLET    TAKE 1 TABLET 3 TIMES A DAY AS NEEDED FOR DIZZINESS/NAUSEA   MULTIPLE VITAMINS-MINERALS (CENTRUM SILVER PO)    Take by mouth daily.     NONFORMULARY OR COMPOUNDED ITEM    Allergy Vaccine 1:10 Given at [facility]   OMEGA-3 FATTY ACIDS (FISH OIL MAXIMUM STRENGTH) 1200 MG CAPS    Take by mouth. 2 by mouth daily   PANTOPRAZOLE (PROTONIX) 40 MG TABLET    TAKE 1 TABLET BY MOUTH EVERY DAY   POTASSIUM CHLORIDE SA (K-DUR,KLOR-CON) 20 MEQ TABLET    TAKE 1 TABLET BY MOUTH DAILY   PROPYLENE GLYCOL (SYSTANE BALANCE) 0.6 % SOLN    Place 2 drops into both eyes 2 (two) times daily.     SALINE NASAL SPRAY NA    Place 88 mcg into the nose 2 (two) times daily.  Modified Medications   No medications on file  Discontinued Medications   BENZONATATE (TESSALON PERLES) 100 MG CAPSULE    Take 1-2 capsules (100-200 mg total) by mouth 3 (three) times daily as needed for cough.   DOXYCYCLINE (VIBRA-TABS) 100 MG TABLET    Take 1 tablet (100 mg total) by mouth 2 (two) times daily.   FLUCONAZOLE (DIFLUCAN) 150 MG TABLET    Take 1 tablet (150 mg total) by mouth daily. Repeat dose in 3 days   PREDNISONE (DELTASONE) 20 MG TABLET    Take 3 tablets (60 mg total) by mouth once.    Physical Exam: Filed Vitals:   02/13/15 1542  BP: 130/72  Pulse: 88  Temp: 98.6 F (37 C)  TempSrc: Oral   Weight: 172 lb (78.019 kg)  SpO2: 96%   Physical Exam  Constitutional: She is oriented to person, place, and time. She appears well-developed and well-nourished.  HENT:  Head: Normocephalic and atraumatic.  Right Ear: External ear normal.  Left Ear: External  ear normal.  Mouth/Throat: Oropharynx is clear and moist.  Nasal congestion, dullness of TMs  Eyes: Conjunctivae are normal.  Neck: Neck supple.  Cardiovascular: Normal rate, regular rhythm, normal heart sounds and intact distal pulses.   Pulmonary/Chest: Effort normal and breath sounds normal. She has no wheezes.  Lymphadenopathy:    She has no cervical adenopathy.  Neurological: She is alert and oriented to person, place, and time.  Skin: Skin is warm and dry.    Labs reviewed: Basic Metabolic Panel:  Recent Labs  05/24/14 1135 09/23/14 0809 12/27/14 1011  NA 139 142 142  K 4.1 3.5 4.1  CL 99 100 100  CO2 28 27 26   GLUCOSE 71 105* 111*  BUN 15 18 17   CREATININE 0.68 0.63 0.70  CALCIUM 9.3 9.4 9.4  TSH 2.250 3.080  --    Liver Function Tests:  Recent Labs  05/24/14 1135 09/23/14 0809  AST 22 22  ALT 16 15  ALKPHOS 107 97  BILITOT 0.3 0.5  PROT 6.4 6.5   No results for input(s): LIPASE, AMYLASE in the last 8760 hours. No results for input(s): AMMONIA in the last 8760 hours. CBC:  Recent Labs  05/12/14 1019 05/24/14 1135 09/23/14 0809  WBC  --  6.5 7.0  NEUTROABS  --  2.9 2.6  HGB 15.3* 13.8  --   HCT 45.0 40.2 41.1  MCV  --  87  --    Lipid Panel:  Recent Labs  03/25/14 1043 09/23/14 0809  CHOL 224* 222*  HDL 67 63  LDLCALC 142* 134*  TRIG 73 126  CHOLHDL 3.3 3.5   Lab Results  Component Value Date   HGBA1C 5.6 12/29/2014   Assessment/Plan 1. Asthma, allergic, mild intermittent, uncomplicated -given depomedrol shot x 1 for 38m -also a second course of doxy was provided for 10 more days, take yogurt with it -encourage hydration, warm humidity  2. Bacterial  sinusitis -extend course of abx -mucinex (plain) twice daily for cough and congestion and as in #1 - doxycycline (VIBRA-TABS) 100 MG tablet; Take 1 tablet (100 mg total) by mouth 2 (two) times daily.  Dispense: 20 tablet; Refill: 0   Labs/tests ordered:none Next appt:  Keep as scheduled with [name]  Antoine Vandermeulen L. Addisen Chappelle, D.O. [facility] [address] [place] [address]Cell Phone (Mon-Fri 8am-5pm):  [phone] Call:  [phone]& follow prompts after 5pm & weekends Office Phone:  [phone] Fax:  [phone]

## 2015-02-20 ENCOUNTER — Ambulatory Visit (INDEPENDENT_AMBULATORY_CARE_PROVIDER_SITE_OTHER): Payer: Medicare Other

## 2015-02-20 DIAGNOSIS — J309 Allergic rhinitis, unspecified: Secondary | ICD-10-CM

## 2015-02-21 ENCOUNTER — Other Ambulatory Visit: Payer: Self-pay | Admitting: Internal Medicine

## 2015-02-21 ENCOUNTER — Telehealth: Payer: Self-pay | Admitting: *Deleted

## 2015-02-21 ENCOUNTER — Other Ambulatory Visit: Payer: Self-pay | Admitting: Nurse Practitioner

## 2015-02-21 NOTE — Telephone Encounter (Signed)
Patient called and stated that her Sinusitis is no better so patient schedule an appointment with her Allergist and going to see him on Friday.

## 2015-02-23 ENCOUNTER — Encounter: Payer: Self-pay | Admitting: Adult Health

## 2015-02-23 ENCOUNTER — Ambulatory Visit (INDEPENDENT_AMBULATORY_CARE_PROVIDER_SITE_OTHER): Payer: Medicare Other | Admitting: Adult Health

## 2015-02-23 ENCOUNTER — Other Ambulatory Visit: Payer: Self-pay | Admitting: Internal Medicine

## 2015-02-23 ENCOUNTER — Ambulatory Visit (INDEPENDENT_AMBULATORY_CARE_PROVIDER_SITE_OTHER)
Admission: RE | Admit: 2015-02-23 | Discharge: 2015-02-23 | Disposition: A | Discharge status: Home / Self Care | Payer: Medicare Other | Source: Ambulatory Visit | Attending: Adult Health | Admitting: Adult Health

## 2015-02-23 VITALS — BP 124/70 | HR 80 | Temp 98.1°F | Ht 64.0 in | Wt 175.6 lb

## 2015-02-23 DIAGNOSIS — R05 Cough: Secondary | ICD-10-CM | POA: Diagnosis not present

## 2015-02-23 DIAGNOSIS — J019 Acute sinusitis, unspecified: Secondary | ICD-10-CM | POA: Diagnosis not present

## 2015-02-23 DIAGNOSIS — J4520 Mild intermittent asthma, uncomplicated: Secondary | ICD-10-CM

## 2015-02-23 DIAGNOSIS — R0602 Shortness of breath: Secondary | ICD-10-CM | POA: Diagnosis not present

## 2015-02-23 MED ORDER — LEVALBUTEROL HCL 0.63 MG/3ML IN NEBU
0.6300 mg | INHALATION_SOLUTION | Freq: Once | RESPIRATORY_TRACT | Status: AC
  Administered 2015-02-23: 0.63 mg via RESPIRATORY_TRACT

## 2015-02-23 NOTE — Patient Instructions (Signed)
Finish Doxycycline .  Mucinex Twice daily  As needed  Cough/congestion  Fluids and rest  Delsym 2 tsp Twice daily  As needed  Cough.  Tessalon Three times a day  As needed  Cough .  Prednisone taper over next week.  follow up Dr. Annamaria Boots  As planned and As needed   Please contact office for sooner follow up if symptoms do not improve or worsen or seek emergency care

## 2015-02-23 NOTE — Assessment & Plan Note (Signed)
Slow to resolve  cxr w/ no acute pna , bronchitic changes  Hold on additonal abx at this time  xopenex neb x 1   Plan  Finish Doxycycline .  Mucinex Twice daily  As needed  Cough/congestion  Fluids and rest  Delsym 2 tsp Twice daily  As needed  Cough.  Tessalon Three times a day  As needed  Cough .  Prednisone taper over next week.  follow up Dr. Annamaria Boots  As planned and As needed   Please contact office for sooner follow up if symptoms do not improve or worsen or seek emergency care

## 2015-02-23 NOTE — Assessment & Plan Note (Signed)
Resolving

## 2015-02-23 NOTE — Progress Notes (Signed)
   Subjective:    Patient ID: ELVIS BOOT, female    DOB: 08-25-1940, 74 y.o.   MRN: [id number]  HPI 74 yo female with AR and Asthma  On allergy vaccine   02/23/2015 Acute OV : allergic rhinitis, asthma, food intolerance, multiple medication intolerance Pt presents for an acute office visit.  Pt of CY. Pt states worsening SOB and productive cough over 1 month. Has become worse over the past 2 weeks. Pt saw her PCP who stated sinus infection and placed on doxycycline 10 day course. Pt states that today is last day of abt. Pt denies fever, chest pain or discomfort, back pain. c/s, n/v, hemoptysis or edema. Prior to doxycyline she took zpack and got a depo medrol shot.  Wants something for cough .      Review of Systems Constitutional:   No  weight loss, night sweats,  Fevers, chills, + fatigue, or  lassitude.  HEENT:   No headaches,  Difficulty swallowing,  Tooth/dental problems, or  Sore throat,                No sneezing, itching, ear ache,  +nasal congestion, post nasal drip,   CV:  No chest pain,  Orthopnea, PND, swelling in lower extremities, anasarca, dizziness, palpitations, syncope.   GI  No heartburn, indigestion, abdominal pain, nausea, vomiting, diarrhea, change in bowel habits, loss of appetite, bloody stools.   Resp:   No chest wall deformity  Skin: no rash or lesions.  GU: no dysuria, change in color of urine, no urgency or frequency.  No flank pain, no hematuria   MS:  No joint pain or swelling.  No decreased range of motion.  No back pain.  Psych:  No change in mood or affect. No depression or anxiety.  No memory loss.         Objective:   Physical Exam GEN: A/Ox3; pleasant , NAD, elderly   HEENT:  [place]o/AT,  EACs-clear, TMs-wnl, NOSE-clear drainage , THROAT-clear, no lesions, no postnasal drip or exudate noted.   NECK:  Supple w/ fair ROM; no JVD; normal carotid impulses w/o bruits; no thyromegaly or nodules palpated; no lymphadenopathy.  RESP  Few  exp wheezing , .no accessory muscle use, no dullness to percussion  CARD:  RRR, no m/r/g  , no peripheral edema, pulses intact, no cyanosis or clubbing.  GI:   Soft & nt; nml bowel sounds; no organomegaly or masses detected.  Musco: Warm bil, no deformities or joint swelling noted.   Neuro: alert, no focal deficits noted.    Skin: Warm, no lesions or rashes  CXR 02/23/2015 increased interstitial marking w/ no PNA or CHF  Reviewed personally        Assessment & Plan:

## 2015-02-23 NOTE — Addendum Note (Signed)
Addended by: Osa Craver on: 02/23/2015 04:53 PM   Modules accepted: Orders

## 2015-02-24 ENCOUNTER — Telehealth: Payer: Self-pay | Admitting: Adult Health

## 2015-02-24 ENCOUNTER — Ambulatory Visit: Payer: Medicare Other | Admitting: Internal Medicine

## 2015-02-24 MED ORDER — PREDNISONE 10 MG PO TABS: ORAL_TABLET | ORAL | Status: DC

## 2015-02-24 MED ORDER — BENZONATATE 200 MG PO CAPS: 200.0000 mg | ORAL_CAPSULE | Freq: Three times a day (TID) | ORAL | Status: DC | PRN

## 2015-02-24 NOTE — Telephone Encounter (Signed)
Patient Instructions     Finish Doxycycline .  Mucinex Twice daily As needed Cough/congestion  Fluids and rest  Delsym 2 tsp Twice daily As needed Cough.  Tessalon Three times a day As needed Cough .  Prednisone taper over next week.  follow up Dr. Annamaria Boots As planned and As needed  Please contact office for sooner follow up if symptoms do not improve or worsen or seek emergency care     Pt is needing Tessalon and prednisone prescriptions from yesterday to be sent in.  TP - please clarify what pred taper you want sent in. Thanks!

## 2015-02-24 NOTE — Telephone Encounter (Signed)
SENT RX  SORRY ABOUT THAT

## 2015-02-27 ENCOUNTER — Ambulatory Visit (INDEPENDENT_AMBULATORY_CARE_PROVIDER_SITE_OTHER): Payer: Medicare Other

## 2015-02-27 DIAGNOSIS — J309 Allergic rhinitis, unspecified: Secondary | ICD-10-CM | POA: Diagnosis not present

## 2015-03-06 ENCOUNTER — Ambulatory Visit (INDEPENDENT_AMBULATORY_CARE_PROVIDER_SITE_OTHER): Payer: Medicare Other

## 2015-03-06 DIAGNOSIS — J309 Allergic rhinitis, unspecified: Secondary | ICD-10-CM

## 2015-03-13 ENCOUNTER — Ambulatory Visit (INDEPENDENT_AMBULATORY_CARE_PROVIDER_SITE_OTHER): Payer: Medicare Other

## 2015-03-13 DIAGNOSIS — J309 Allergic rhinitis, unspecified: Secondary | ICD-10-CM

## 2015-03-20 ENCOUNTER — Ambulatory Visit (INDEPENDENT_AMBULATORY_CARE_PROVIDER_SITE_OTHER): Payer: Medicare Other

## 2015-03-20 DIAGNOSIS — Z23 Encounter for immunization: Secondary | ICD-10-CM | POA: Diagnosis not present

## 2015-03-24 ENCOUNTER — Other Ambulatory Visit: Payer: Self-pay | Admitting: Internal Medicine

## 2015-03-27 ENCOUNTER — Ambulatory Visit (INDEPENDENT_AMBULATORY_CARE_PROVIDER_SITE_OTHER): Payer: Medicare Other

## 2015-03-27 DIAGNOSIS — J309 Allergic rhinitis, unspecified: Secondary | ICD-10-CM | POA: Diagnosis not present

## 2015-03-28 ENCOUNTER — Telehealth: Payer: Self-pay | Admitting: Internal Medicine

## 2015-03-28 NOTE — Telephone Encounter (Signed)
Samples will be left at the front desk for pick up. Pt is aware. Nothing further was needed.

## 2015-03-30 ENCOUNTER — Other Ambulatory Visit: Payer: Medicare Other

## 2015-03-30 DIAGNOSIS — E785 Hyperlipidemia, unspecified: Secondary | ICD-10-CM

## 2015-03-31 LAB — COMPREHENSIVE METABOLIC PANEL
A/G RATIO: 2 (ref 1.1–2.5)
ALK PHOS: 91 IU/L (ref 39–117)
ALT: 21 IU/L (ref 0–32)
AST: 28 IU/L (ref 0–40)
Albumin: 4.5 g/dL (ref 3.5–4.8)
BUN/Creatinine Ratio: 23 (ref 11–26)
BUN: 17 mg/dL (ref 8–27)
Bilirubin Total: 0.5 mg/dL (ref 0.0–1.2)
CO2: 22 mmol/L (ref 18–29)
Calcium: 9.8 mg/dL (ref 8.7–10.3)
Chloride: 102 mmol/L (ref 97–106)
Creatinine, Ser: 0.75 mg/dL (ref 0.57–1.00)
GFR calc Af Amer: 90 mL/min/{1.73_m2} (ref >59)
GFR calc non Af Amer: 78 mL/min/{1.73_m2} (ref >59)
GLOBULIN, TOTAL: 2.2 g/dL (ref 1.5–4.5)
Glucose: 105 mg/dL — ABNORMAL HIGH (ref 65–99)
POTASSIUM: 3.8 mmol/L (ref 3.5–5.2)
SODIUM: 142 mmol/L (ref 136–144)
Total Protein: 6.7 g/dL (ref 6.0–8.5)

## 2015-03-31 LAB — LIPID PANEL
CHOLESTEROL TOTAL: 218 mg/dL — AB (ref 100–199)
Chol/HDL Ratio: 4.1 ratio units (ref 0.0–4.4)
HDL: 53 mg/dL (ref >39)
LDL Calculated: 134 mg/dL — ABNORMAL HIGH (ref 0–99)
TRIGLYCERIDES: 153 mg/dL — AB (ref 0–149)
VLDL Cholesterol Cal: 31 mg/dL (ref 5–40)

## 2015-03-31 LAB — CBC WITH DIFFERENTIAL/PLATELET
BASOS ABS: 0.1 10*3/uL (ref 0.0–0.2)
BASOS: 1 %
EOS (ABSOLUTE): 0.1 10*3/uL (ref 0.0–0.4)
Eos: 1 %
HEMOGLOBIN: 14.5 g/dL (ref 11.1–15.9)
Hematocrit: 42 % (ref 34.0–46.6)
IMMATURE GRANS (ABS): 0 10*3/uL (ref 0.0–0.1)
Immature Granulocytes: 0 %
LYMPHS: 44 %
Lymphocytes Absolute: 2.7 10*3/uL (ref 0.7–3.1)
MCH: 28.9 pg (ref 26.6–33.0)
MCHC: 34.5 g/dL (ref 31.5–35.7)
MCV: 84 fL (ref 79–97)
MONOCYTES: 8 %
Monocytes Absolute: 0.5 10*3/uL (ref 0.1–0.9)
NEUTROS ABS: 2.9 10*3/uL (ref 1.4–7.0)
Neutrophils: 46 %
PLATELETS: 385 10*3/uL — AB (ref 150–379)
RBC: 5.01 x10E6/uL (ref 3.77–5.28)
RDW: 14.1 % (ref 12.3–15.4)
WBC: 6.2 10*3/uL (ref 3.4–10.8)

## 2015-04-03 ENCOUNTER — Ambulatory Visit (INDEPENDENT_AMBULATORY_CARE_PROVIDER_SITE_OTHER): Payer: Medicare Other

## 2015-04-03 DIAGNOSIS — J309 Allergic rhinitis, unspecified: Secondary | ICD-10-CM | POA: Diagnosis not present

## 2015-04-04 ENCOUNTER — Encounter: Payer: Self-pay | Admitting: Nurse Practitioner

## 2015-04-04 ENCOUNTER — Ambulatory Visit (INDEPENDENT_AMBULATORY_CARE_PROVIDER_SITE_OTHER): Payer: Medicare Other | Admitting: Nurse Practitioner

## 2015-04-04 VITALS — BP 126/76 | HR 71 | Temp 97.5°F | Resp 20 | Ht 64.0 in | Wt 163.2 lb

## 2015-04-04 DIAGNOSIS — E2839 Other primary ovarian failure: Secondary | ICD-10-CM | POA: Diagnosis not present

## 2015-04-04 DIAGNOSIS — J309 Allergic rhinitis, unspecified: Secondary | ICD-10-CM

## 2015-04-04 DIAGNOSIS — E785 Hyperlipidemia, unspecified: Secondary | ICD-10-CM

## 2015-04-04 DIAGNOSIS — R739 Hyperglycemia, unspecified: Secondary | ICD-10-CM

## 2015-04-04 DIAGNOSIS — I10 Essential (primary) hypertension: Secondary | ICD-10-CM | POA: Diagnosis not present

## 2015-04-04 DIAGNOSIS — Z0000 Encounter for general adult medical examination without abnormal findings: Secondary | ICD-10-CM | POA: Diagnosis not present

## 2015-04-04 NOTE — Progress Notes (Signed)
Patient ID: [name], female   DOB: 01/20/1941, 74 y.o.   MRN: [id number]    PCP: Lauree Chandler, NP  Advanced Directive information Does patient have an advance directive?: No, Would patient like information on creating an advanced directive?: Yes - Educational materials given  Allergies  Allergen Reactions  . Ace Inhibitors   . Amoxicillin     REACTION: unknown? pain in right kidney  . Benicar Hct [Olmesartan Medoxomil-Hctz]     Extreme weakness  . Budesonide-Formoterol Fumarate     Causes tremors and numbness  . Chlorzoxazone [Chlorzoxazone]   . Citalopram Hydrobromide     REACTION: hives  . Citalopram Hydrobromide   . Clonidine Derivatives   . Droperidol     REACTION: hives  . Flexeril [Cyclobenzaprine Hcl]   . Ketorolac Tromethamine     REACTION: hives  . Ketorolac Tromethamine   . Metoclopramide Hcl   . Mometasone Furo-Formoterol Fum     Causes sore throat, blurred vision and unable to sleep--started on med 09-17-10  . Morphine     REACTION: hives and itching  . Olmesartan Medoxomil     REACTION: fatique    Chief Complaint  Patient presents with  . Annual Exam    Annual Exam copy of labs printed  . Medical Management of Chronic Issues     HPI: Patient is a 74 y.o. female seen in the office today for wellness exam No hospitalization or major illness in the last year Doing well - attempting weight loss Screenings: Colon Cancer- completed 2010, every 10 years Breast Cancer- last mammogram April 2016 Cervical Cancer- aged out Osteoporosis- Dexa Scan years ago- overdue Depression screening Depression screen [facility] 2/9 04/04/2015 12/29/2014 03/30/2014 01/05/2014 01/01/2013  Decreased Interest 0 0 0 0 0  Down, Depressed, Hopeless 0 0 0 0 0  PHQ - 2 Score 0 0 0 0 0  reports she was having depression about 3 months but this has improved  Falls Fall Risk  04/04/2015 12/29/2014 09/29/2014 05/24/2014 03/30/2014  Falls in the past year? No Yes No No No  Number  falls in past yr: - 1 - - -  Injury with Fall? - No - - -   MMSE MMSE - Mini Mental State Exam 09/22/2013  Orientation to time 5  Orientation to Place 5  Registration 3  Attention/ Calculation 5  Recall 2  Language- name 2 objects 2  Language- repeat 1  Language- follow 3 step command 3  Language- read & follow direction 1  Write a sentence 1  Copy design 1  Total score 29   Vaccines Immunization History  Administered Date(s) Administered  . Influenza Split 02/11/2011, 02/25/2012  . Influenza Whole 02/10/2009, 02/26/2010  . Influenza,inj,Quad PF,36+ Mos 02/09/2014, 03/20/2015  . Influenza-Unspecified 03/13/2013  . Pneumococcal Polysaccharide-23 08/25/2014  . Tdap 12/06/2013  declines shingles vaccine  Smoking status:.-hx of smoking, quit when she was 22 Alcohol use: none  Dentist: does not go Ophthalmologist: yearly  Exercise regimen: 35 mins power walking every other day Diet: low sugar, heart healthy  Functional Status of ADLs: independent of all ADLs   Review of Systems:  Review of Systems  Constitutional: Negative for fever and chills.  HENT: Positive for hearing loss. Negative for congestion, ear pain, sore throat and tinnitus.   Eyes:       Cataracts- sees eye MD  Respiratory: Negative for cough, shortness of breath and wheezing.   Cardiovascular: Negative for chest pain and palpitations.  Gastrointestinal: Negative  for abdominal pain, diarrhea, constipation and blood in stool.       GERD, controlled on current medication  Genitourinary: Negative for dysuria and difficulty urinating.  Musculoskeletal: Negative for myalgias and arthralgias.  Skin: Negative for rash.  Neurological: Negative for weakness and headaches.  Psychiatric/Behavioral: Negative for suicidal ideas, decreased concentration and agitation. The patient is not nervous/anxious.     Past Medical History  Diagnosis Date  . ALLERGIC RHINITIS   . Hypertension   . Asthma   .  Hypercholesterolemia     borderline  . GERD (gastroesophageal reflux disease)   . Diverticulosis of colon   . IBS (irritable bowel syndrome)   . Colon polyp   . History of nephrolithiasis   . Osteoarthritis   . Fibromyalgia   . Vitamin D deficiency   . Postconcussion syndrome   . Cervical strain, acute   . Dizziness   . Anxiety   . Personal history of allergy to unspecified medicinal agent   . Blood type O+    Past Surgical History  Procedure Laterality Date  . Cholecystectomy, laparoscopic  2004    for gallstone pancreatitis  . Thyroid surgery    . Knee surgery Right   . Knee surgery Left    Social History:   reports that she quit smoking about 52 years ago. Her smoking use included Cigarettes. She has a 16 pack-year smoking history. She has never used smokeless tobacco. She reports that she does not drink alcohol or use illicit drugs.  Family History  Problem Relation Age of Onset  . Breast cancer Mother   . Alzheimer's disease Mother   . Heart disease Mother   . Ovarian cancer Paternal Grandmother   . Arthritis Other     Cousins   . COPD Brother   . COPD Cousin     Maternal side   . Heart attack Maternal Uncle   . Heart disease Sister   . Alcohol abuse Sister     Medications: Patient's Medications  New Prescriptions   No medications on file  Previous Medications   ACETAMINOPHEN (TYLENOL) 500 MG TABLET    Take 500 mg by mouth every 6 (six) hours as needed (for headache as needed).    ADVAIR DISKUS 250-50 MCG/DOSE AEPB    INHALE 1 PUFF TWICE DAILY RINSE MOUTH AFTER USE   ALBUTEROL (PROVENTIL HFA;VENTOLIN HFA) 108 (90 BASE) MCG/ACT INHALER    Inhale 2 puffs into the lungs every 4 (four) hours as needed for wheezing or shortness of breath. RESCUE   AMLODIPINE (NORVASC) 5 MG TABLET    TAKE 1 TABLET BY MOUTH DAILY FOR BLOOD PRESSURE   ASPIRIN 81 MG EC TABLET    Take 81 mg by mouth daily.     BENZONATATE (TESSALON) 200 MG CAPSULE    Take 1 capsule (200 mg total) by  mouth 3 (three) times daily as needed for cough.   CETIRIZINE HCL (ZYRTEC ALLERGY) 10 MG CAPS    Take 1 capsule by mouth daily.     CHOLECALCIFEROL (VITAMIN D3) 1000 UNITS CAPS    Take by mouth daily.     DOXYCYCLINE (VIBRA-TABS) 100 MG TABLET    Take 1 tablet (100 mg total) by mouth 2 (two) times daily.   FAMOTIDINE (PEPCID) 20 MG TABLET    Take 20 mg by mouth 2 (two) times daily.   FLUTICASONE (FLONASE) 50 MCG/ACT NASAL SPRAY    INHALE 1 TO 2 SPRAYS IN EACH NOSTRIL TWICE DAILY   HYDROCHLOROTHIAZIDE (  MICROZIDE) 12.5 MG CAPSULE    TAKE 1 CAPSULE BY MOUTH EVERY DAY   INJECTION DEVICE MISC    by Does not apply route. Allergy injection once every Monday, Administered by Dr.Young   MECLIZINE (ANTIVERT) 25 MG TABLET    TAKE 1 TABLET 3 TIMES A DAY AS NEEDED FOR DIZZINESS/NAUSEA   MULTIPLE VITAMINS-MINERALS (CENTRUM SILVER PO)    Take by mouth daily.     NONFORMULARY OR COMPOUNDED ITEM    Allergy Vaccine 1:10 Given at [facility]   OMEGA-3 FATTY ACIDS (FISH OIL MAXIMUM STRENGTH) 1200 MG CAPS    Take by mouth. 2 by mouth daily   PANTOPRAZOLE (PROTONIX) 40 MG TABLET    TAKE 1 TABLET BY MOUTH EVERY DAY   POTASSIUM CHLORIDE SA (K-DUR,KLOR-CON) 20 MEQ TABLET    TAKE 1 TABLET BY MOUTH DAILY   PREDNISONE (DELTASONE) 10 MG TABLET    4 tabs for 2 days, then 3 tabs for 2 days, 2 tabs for 2 days, then 1 tab for 2 days, then stop   PROPYLENE GLYCOL (SYSTANE BALANCE) 0.6 % SOLN    Place 2 drops into both eyes 2 (two) times daily.     SALINE NASAL SPRAY NA    Place 88 mcg into the nose 2 (two) times daily.  Modified Medications   No medications on file  Discontinued Medications   No medications on file     Physical Exam:  Filed Vitals:   04/04/15 1106  BP: 126/76  Pulse: 71  Temp: 97.5 F (36.4 C)  TempSrc: Oral  Resp: 20  Height: 5' 4"  (1.626 m)  Weight: 163 lb 3.2 oz (74.027 kg)  SpO2: 95%   Body mass index is 28 kg/(m^2).  Physical Exam  Constitutional: She is oriented to person,  place, and time. She appears well-developed and well-nourished. No distress.  HENT:  Head: Normocephalic and atraumatic.  Right Ear: External ear normal.  Left Ear: External ear normal.  Nose: Nose normal.  Mouth/Throat: Oropharynx is clear and moist. No oropharyngeal exudate.  Eyes: Conjunctivae and EOM are normal. Pupils are equal, round, and reactive to light.  Neck: Normal range of motion. Neck supple. No thyromegaly present.  Cardiovascular: Normal rate, regular rhythm, normal heart sounds and intact distal pulses.   Pulmonary/Chest: Effort normal and breath sounds normal.  Abdominal: Soft. Bowel sounds are normal. She exhibits no distension. There is no tenderness.  Musculoskeletal: Normal range of motion. She exhibits no edema or tenderness.  Lymphadenopathy:    She has no cervical adenopathy.  Neurological: She is alert and oriented to person, place, and time.  Skin: Skin is warm and dry. She is not diaphoretic.  Psychiatric: She has a normal mood and affect.    Labs reviewed: Basic Metabolic Panel:  Recent Labs  05/24/14 1135 09/23/14 0809 12/27/14 1011 03/30/15 1032  NA 139 142 142 142  K 4.1 3.5 4.1 3.8  CL 99 100 100 102  CO2 28 27 26 22   GLUCOSE 71 105* 111* 105*  BUN 15 18 17 17   CREATININE 0.68 0.63 0.70 0.75  CALCIUM 9.3 9.4 9.4 9.8  TSH 2.250 3.080  --   --    Liver Function Tests:  Recent Labs  05/24/14 1135 09/23/14 0809 03/30/15 1032  AST 22 22 28   ALT 16 15 21   ALKPHOS 107 97 91  BILITOT 0.3 0.5 0.5  PROT 6.4 6.5 6.7  ALBUMIN 4.3 4.3 4.5   No results for input(s): LIPASE, AMYLASE in the  last 8760 hours. No results for input(s): AMMONIA in the last 8760 hours. CBC:  Recent Labs  05/12/14 1019 05/24/14 1135 09/23/14 0809 03/30/15 1032  WBC  --  6.5 7.0 6.2  NEUTROABS  --  2.9 2.6 2.9  HGB 15.3* 13.8  --   --   HCT 45.0 40.2 41.1 42.0  MCV  --  87  --   --    Lipid Panel:  Recent Labs  09/23/14 0809 03/30/15 1032  CHOL 222*  218*  HDL 63 53  LDLCALC 134* 134*  TRIG 126 153*  CHOLHDL 3.5 4.1   TSH:  Recent Labs  05/24/14 1135 09/23/14 0809  TSH 2.250 3.080   A1C: Lab Results  Component Value Date   HGBA1C 5.6 12/29/2014     Assessment/Plan 1. Encounter for Medicare annual wellness exam The patient is doing well and no distinct problems were identified on exam. The patient was counseled regarding the appropriate use of alcohol, regular self-examination of the breasts on a monthly basis, prevention of dental and periodontal disease, diet, regular sustained exercise for at least 30 minutes 5 times per week, routine screening interval for mammogram as recommended by the [place] and ACOG, importance of regular PAP smears, and recommended schedule for GI hemoccult testing, colonoscopy, cholesterol, thyroid and diabetes screening. -MMSE 29/30 -increase activity to 30 mins 5 days a week -no reports of depression  -encouraged routine dental care  2. Hyperlipidemia LDL elevated however pt does not wish to be on statin, has been on statin in the past with adverse effect. conts on diet modification and exercise.  -conts on fish oil  3. Hyperglycemia Stable, cont to watch sugar and carb intake -cont to exercise 30 mins 5 days a week   4. Allergic rhinitis, unspecified allergic rhinitis type Controlled on current regiment, conts on zyrtec, nasal spray and allergy injections.  5. Essential hypertension -blood pressure stable, conts on norvasc and HCTZ with potassium supplement   6. Estrogen deficiency - DG Bone Density; Future screening    Carlos American. Harle Battiest  [facility] [phone] 8 am - 5 pm) [phone] (after hours)

## 2015-04-04 NOTE — Patient Instructions (Signed)
Will place order for bone density test  recommend dental exam every 6 months   Cont heart healthy diet and reducing sugar intake  Heart-Healthy Eating Plan Many factors influence your heart health, including eating and exercise habits. Heart (coronary) risk increases with abnormal blood fat (lipid) levels. Heart-healthy meal planning includes limiting unhealthy fats, increasing healthy fats, and making other small dietary changes. This includes maintaining a healthy body weight to help keep lipid levels within a normal range. WHAT TYPES OF FAT SHOULD I CHOOSE?  Choose healthy fats more often. Choose monounsaturated and polyunsaturated fats, such as olive oil and canola oil, flaxseeds, walnuts, almonds, and seeds.  Eat more omega-3 fats. Good choices include salmon, mackerel, sardines, tuna, flaxseed oil, and ground flaxseeds. Aim to eat fish at least two times each week.  Limit saturated fats. Saturated fats are primarily found in animal products, such as meats, butter, and cream. Plant sources of saturated fats include palm oil, palm kernel oil, and coconut oil.  Avoid foods with partially hydrogenated oils in them. These contain trans fats. Examples of foods that contain trans fats are stick margarine, some tub margarines, cookies, crackers, and other baked goods. WHAT GENERAL GUIDELINES DO I NEED TO FOLLOW?  Check food labels carefully to identify foods with trans fats or high amounts of saturated fat.  Fill one half of your plate with vegetables and green salads. Eat 4-5 servings of vegetables per day. A serving of vegetables equals 1 cup of raw leafy vegetables,  cup of raw or cooked cut-up vegetables, or  cup of vegetable juice.  Fill one fourth of your plate with whole grains. Look for the word "whole" as the first word in the ingredient list.  Fill one fourth of your plate with lean protein foods.  Eat 4-5 servings of fruit per day. A serving of fruit equals one medium whole  fruit,  cup of dried fruit,  cup of fresh, frozen, or canned fruit, or  cup of 100% fruit juice.  Eat more foods that contain soluble fiber. Examples of foods that contain this type of fiber are apples, broccoli, carrots, beans, peas, and barley. Aim to get 20-30 g of fiber per day.  Eat more home-cooked food and less restaurant, buffet, and fast food.  Limit or avoid alcohol.  Limit foods that are high in starch and sugar.  Avoid fried foods.  Cook foods by using methods other than frying. Baking, boiling, grilling, and broiling are all great options. Other fat-reducing suggestions include:  Removing the skin from poultry.  Removing all visible fats from meats.  Skimming the fat off of stews, soups, and gravies before serving them.  Steaming vegetables in water or broth.  Lose weight if you are overweight. Losing just 5-10% of your initial body weight can help your overall health and prevent diseases such as diabetes and heart disease.  Increase your consumption of nuts, legumes, and seeds to 4-5 servings per week. One serving of dried beans or legumes equals  cup after being cooked, one serving of nuts equals 1 ounces, and one serving of seeds equals  ounce or 1 tablespoon.  You may need to monitor your salt (sodium) intake, especially if you have high blood pressure. Talk with your health care provider or dietitian to get more information about reducing sodium. WHAT FOODS CAN I EAT? Grains Breads, including [place], white, pita, wheat, raisin, rye, oatmeal, and [place]. Tortillas that are neither fried nor made with lard or trans fat. Low-fat rolls,  including hotdog and hamburger buns and English muffins. Biscuits. Muffins. Waffles. Pancakes. Light popcorn. Whole-grain cereals. Flatbread. Melba toast. Pretzels. Breadsticks. Rusks. Low-fat snacks and crackers, including oyster, saltine, matzo, graham, animal, and rye. Rice and pasta, including brown rice and those that are made  with whole wheat. Vegetables All vegetables. Fruits All fruits, but limit coconut. Meats and Other Protein Sources Lean, well-trimmed beef, veal, pork, and lamb. Chicken and [place] without skin. All fish and shellfish. Wild duck, rabbit, pheasant, and venison. Egg whites or low-cholesterol egg substitutes. Dried beans, peas, lentils, and tofu.Seeds and most nuts. Dairy Low-fat or nonfat cheeses, including ricotta, string, and mozzarella. Skim or 1% milk that is liquid, powdered, or evaporated. Buttermilk that is made with low-fat milk. Nonfat or low-fat yogurt. Beverages Mineral water. Diet carbonated beverages. Sweets and Desserts Sherbets and fruit ices. Honey, jam, marmalade, jelly, and syrups. Meringues and gelatins. Pure sugar candy, such as hard candy, jelly beans, gumdrops, mints, marshmallows, and small amounts of dark chocolate. [employer] Eat all sweets and desserts in moderation. Fats and Oils Nonhydrogenated (trans-free) margarines. Vegetable oils, including soybean, sesame, sunflower, olive, peanut, safflower, corn, canola, and cottonseed. Salad dressings or mayonnaise that are made with a vegetable oil. Limit added fats and oils that you use for cooking, baking, salads, and as spreads. Other Cocoa powder. Coffee and tea. All seasonings and condiments. The items listed above may not be a complete list of recommended foods or beverages. Contact your dietitian for more options. WHAT FOODS ARE NOT RECOMMENDED? Grains Breads that are made with saturated or trans fats, oils, or whole milk. Croissants. Butter rolls. Cheese breads. Sweet rolls. Donuts. Buttered popcorn. Chow mein noodles. High-fat crackers, such as cheese or butter crackers. Meats and Other Protein Sources Fatty meats, such as hotdogs, short ribs, sausage, spareribs, bacon, ribeye roast or steak, and mutton. High-fat deli meats, such as salami and bologna. Caviar. Domestic duck and goose. Organ meats, such as  kidney, liver, sweetbreads, brains, gizzard, chitterlings, and heart. Dairy Cream, sour cream, cream cheese, and creamed cottage cheese. Whole milk cheeses, including blue (bleu), Monte[place] Jack, [place], [place], [place], [place], [place], [place], [place], and [place]. Whole or 2% milk that is liquid, evaporated, or condensed. Whole buttermilk. Cream sauce or high-fat cheese sauce. Yogurt that is made from whole milk. Beverages Regular sodas and drinks with added sugar. Sweets and Desserts Frosting. Pudding. Cookies. Cakes other than angel food cake. Candy that has milk chocolate or white chocolate, hydrogenated fat, butter, coconut, or unknown ingredients. Buttered syrups. Full-fat ice cream or ice cream drinks. Fats and Oils Gravy that has suet, meat fat, or shortening. Cocoa butter, hydrogenated oils, palm oil, coconut oil, palm kernel oil. These can often be found in baked products, candy, fried foods, nondairy creamers, and whipped toppings. Solid fats and shortenings, including bacon fat, salt pork, lard, and butter. Nondairy cream substitutes, such as coffee creamers and sour cream substitutes. Salad dressings that are made of unknown oils, cheese, or sour cream. The items listed above may not be a complete list of foods and beverages to avoid. Contact your dietitian for more information.   This information is not intended to replace advice given to you by your health care provider. Make sure you discuss any questions you have with your health care provider.   Document Released: 02/06/2008 Document Revised: 05/20/2014 Document Reviewed: 10/21/2013 Elsevier Interactive Patient Education [employer].

## 2015-04-10 ENCOUNTER — Ambulatory Visit (INDEPENDENT_AMBULATORY_CARE_PROVIDER_SITE_OTHER): Payer: Medicare Other

## 2015-04-10 DIAGNOSIS — J309 Allergic rhinitis, unspecified: Secondary | ICD-10-CM

## 2015-04-17 ENCOUNTER — Ambulatory Visit (INDEPENDENT_AMBULATORY_CARE_PROVIDER_SITE_OTHER): Payer: Medicare Other

## 2015-04-17 DIAGNOSIS — J309 Allergic rhinitis, unspecified: Secondary | ICD-10-CM

## 2015-04-19 ENCOUNTER — Ambulatory Visit: Payer: Medicare Other

## 2015-04-24 ENCOUNTER — Ambulatory Visit (INDEPENDENT_AMBULATORY_CARE_PROVIDER_SITE_OTHER): Payer: Medicare Other

## 2015-04-24 DIAGNOSIS — J309 Allergic rhinitis, unspecified: Secondary | ICD-10-CM

## 2015-05-01 ENCOUNTER — Ambulatory Visit (INDEPENDENT_AMBULATORY_CARE_PROVIDER_SITE_OTHER): Payer: Medicare Other

## 2015-05-01 DIAGNOSIS — J309 Allergic rhinitis, unspecified: Secondary | ICD-10-CM | POA: Diagnosis not present

## 2015-05-09 ENCOUNTER — Ambulatory Visit (INDEPENDENT_AMBULATORY_CARE_PROVIDER_SITE_OTHER): Payer: Medicare Other

## 2015-05-09 DIAGNOSIS — J309 Allergic rhinitis, unspecified: Secondary | ICD-10-CM

## 2015-05-12 ENCOUNTER — Telehealth: Payer: Self-pay | Admitting: Internal Medicine

## 2015-05-12 ENCOUNTER — Encounter: Payer: Self-pay | Admitting: Internal Medicine

## 2015-05-12 ENCOUNTER — Ambulatory Visit (INDEPENDENT_AMBULATORY_CARE_PROVIDER_SITE_OTHER): Payer: Medicare Other | Admitting: Internal Medicine

## 2015-05-12 VITALS — BP 122/82 | HR 75 | Ht 64.0 in | Wt 162.0 lb

## 2015-05-12 DIAGNOSIS — J309 Allergic rhinitis, unspecified: Secondary | ICD-10-CM | POA: Diagnosis not present

## 2015-05-12 DIAGNOSIS — J4520 Mild intermittent asthma, uncomplicated: Secondary | ICD-10-CM

## 2015-05-12 DIAGNOSIS — J3089 Other allergic rhinitis: Principal | ICD-10-CM

## 2015-05-12 DIAGNOSIS — J302 Other seasonal allergic rhinitis: Secondary | ICD-10-CM

## 2015-05-12 NOTE — Telephone Encounter (Signed)
Allergy Serum Extract Date Mixed: 05/12/15 Vial: 2 Strength: 1:10 Here/Mail/Pick Up: here Mixed By: tbs Last OV: 05/12/15 Pending OV: n/a

## 2015-05-12 NOTE — Progress Notes (Signed)
01/16/12- 03/04/11- 39 yoF former smoker referred courtesy of Dr [name] for allergy evaluation. She says for most of her adult life she has had seasonal rhinitis. Allergy skin testing remotely by [place] led to allergy vaccine several decades ago. She cannot list all of the medications she has felt intolerant to. Some of them may have been associated with urticaria and itching. Food intolerance includes peanut butter/nausea, ice cream/diarrhea. Insect stings, mainly mosquitoes, caused local swelling and itching.  Since July 2012 she has had at least 4 "attacks" dizziness, frontal headache particularly lying down at night, deep cough and sneeze. She had seen Dr. Ovid Curd and our nurse practitioner. There is no lasting benefit from antibiotics. CT scan of the sinuses was negative. Between "attacks" she feels well. Triggers seem to include strong smells, wet leaves and rainy weather. Loud music makes her dizzy causes headache and discomfort around the eyes. She has had no ENT surgery and no asthma. She has had a number of emergency room trips for "allergy attack". She lives in an apartment with central air conditioning, wall-to-wall carpet, no mold and no pets.  04/09/11- 74 yoF former smoker followed for allergic rhinitis, asthma, food intolerance, multiple medication intolerance. In the last day or so she has been aware of some cough with frontal headache but she has had a sense of dizziness with photophobia and sensitivity to sound probably for 2 or 3 weeks. She blames her discomfort on "allergy". I suggested  head congestion, possibly some migraine, dry heat and maybe a viral infection could explain her symptoms better. She has had intermittent epistaxis from the left nostril. Aware of reflux mostly controlled with Nexium. Allergy profile 03/04/2011-IgE 13.5, no elevation of specific antibody levels on this panel. ESR-normal.  07/24/11-  74 yoF former smoker followed for allergic rhinitis, asthma, food  intolerance, multiple medication intolerance. Blames "allergy" for recurrent episodes of bronchitis through the winter. Asks if proximity to her wood kitchen cabinets could cause this. Told her no, and educated allergic vs viral. PFT 04/18/11- reviewed- Normal spirometry flows and lung volumes. Reduced DLCO 67 Allergy skin testing- Positive intradermal reactions to weed and tree pollens, dust mite, mold.  10/15/11-   74 yoF former smoker followed for allergic rhinitis, asthma, food intolerance, multiple medication intolerance. Feels like allergy vaccine is working well. Continues building vaccine now at 1:500. Spring and fall are usually her worst seasons and she has done well this spring.  01/16/12- 74 yoF former smoker followed for allergic rhinitis, asthma, food intolerance, multiple medication intolerance. Recent flare up of allergies-change in weather; still on vaccine and feels its working well for her. Blames rain for wheezy bronchitis and chest congestion. Says she is "really pleased" with her allergy vaccine now at 1:50 [place]. If she can control rhinitis she thinks she can prevent flareups of her asthmatic bronchitis. Wants to wait on flu vaccine.  08/07/12- 74 yoF former smoker followed for allergic rhinitis, asthma, food intolerance, multiple medication intolerance. FOLLOWS FOR: doing so much better on vaccine-"feels like I have my life back again" Continues allergy vaccine 1:50 GH with no problems. She remains outspoken about the improvement she credits it with giving her. Occasional sneeze. Zyrtec at bedtime. No asthma at all.  01/06/14- 74 yoF former smoker followed for allergic rhinitis, asthma, food intolerance, multiple medication intolerance. ACUTE VISIT: having allergy flare up(head pain, hoarseness, tired,ear aches, and mild sore throat.)-lasts about 2 days after getting allergy injection on Monday then this starts on Saturday.  02/21/14- 74 yoF  former smoker followed for allergic  rhinitis, asthma, food intolerance, multiple medication intolerance. FOLLOWS TDV:VOHYW on allergy vaccine 1:   GH and doing well with the combination of that the the nasal spray on the weekends.  08/25/14-  74 yoF former smoker followed for allergic rhinitis, asthma, food intolerance, multiple medication intolerance Follows for: allergy concerns/asthma; SOB since Saturaday; hoarseness; mild cough; sinus issue Allergy vaccine 1:10  [place] Outside more. "Hayfever attack"-head congestion, ears itch. No recent episodes of bronchitis or wheezing. Reflux causes some hoarseness-on Protonix  02/23/2015-acute visit TP-Acute OV : allergic rhinitis, asthma, food intolerance, multiple medication intolerance Pt presents for an acute office visit.  Pt of CY. Pt states worsening SOB and productive cough over 1 month. Has become worse over the past 2 weeks. Pt saw her PCP who stated sinus infection and placed on doxycycline 10 day course. Pt states that today is last day of abt. Pt denies fever, chest pain or discomfort, back pain. c/s, n/v, hemoptysis or edema. Prior to doxycyline she took zpack and got a depo medrol shot.  Wants something for cough .-------- given doxycycline, benzonatate, prednisone taper CXR 02/23/2015 increased interstitial marking w/ no PNA or CHF   05/12/2015-74 year old female former smoker followed for allergic rhinitis, asthma, food intolerance, multiple medication intolerance Allergy Vaccine 1:10 GH FOLLOWS FOR: Good tolerance on vaccines. Pt reports having her carpets pulled out and vents cleaned in her home and since then her allergies have been doing very well. No complaints.  Rare use rescue inhaler, no sleep disturbance.  ROS-see HPI Constitutional:   No-   weight loss, night sweats, fevers, chills, fatigue, lassitude. HEENT:   No- headaches, difficulty swallowing, tooth/dental problems, sore throat,       +Minimal sneezing, no-itching, ear ache, +nasal congestion, post nasal  drip,  CV:  No-   chest pain, orthopnea, PND, swelling in lower extremities, anasarca, dizziness, palpitations Resp: No-   shortness of breath with exertion or at rest.              No-   productive cough,  non-productive cough,  No- coughing up of blood.              No-   change in color of mucus.   + Rare wheezing.   Skin: No-   rash or lesions. GI:  No-   heartburn, indigestion, abdominal pain, nausea, vomiting,  GU: MS:  No-   joint pain or swelling.   Neuro-     nothing unusual Psych:  No- change in mood or affect. No depression or anxiety.  No memory loss.  OBJ General- Alert, Oriented, Affect-appropriate, Distress- none acute. Pleasant and talkative, over weight Skin- rash-none, lesions- none, excoriation- none Lymphadenopathy- none Head- atraumatic            Eyes- Gross vision intact, PERRLA, conjunctivae clear secretions            Ears- Hearing, canals-normal            Nose- no-mucus bridging, no-Septal dev, polyps, erosion, perforation             Throat- Mallampati II , mucosa clear , drainage- none, tonsils- atrophic, + throat clearing Neck- flexible , trachea midline, no stridor , thyroid nl, carotid no bruit Chest - symmetrical excursion , unlabored           Heart/CV- RRR , no murmur , no gallop  , no rub, nl s1 s2                           -  JVD- none , edema- none, stasis changes- none, varices- none           Lung-clear unlabored, cough-none, dullness-none, rub- none           Chest wall-  Abd- Br/ Gen/ Rectal- Not done, not indicated Extrem- cyanosis- none, clubbing, none, atrophy- none, strength- nl Neuro- grossly intact to observation

## 2015-05-12 NOTE — Patient Instructions (Signed)
We can continue allergy vaccine 1:10 for another year  Please call as needed

## 2015-05-12 NOTE — Assessment & Plan Note (Signed)
Okay to continue allergy vaccine 1 more year as discussed. She wishes to continue. She emphasizes how good control is, rarely needing supplemental medicines now.

## 2015-05-12 NOTE — Assessment & Plan Note (Signed)
Mild intermittent well-controlled uncomplicated No recent exacerbation Plan-call for rescue inhaler refill if needed. We will not spend money for it now

## 2015-05-16 ENCOUNTER — Inpatient Hospital Stay: Admission: RE | Admit: 2015-05-16 | Payer: Medicare Other | Source: Ambulatory Visit

## 2015-05-16 ENCOUNTER — Ambulatory Visit (INDEPENDENT_AMBULATORY_CARE_PROVIDER_SITE_OTHER): Payer: Medicare Other

## 2015-05-16 DIAGNOSIS — J309 Allergic rhinitis, unspecified: Secondary | ICD-10-CM

## 2015-05-22 ENCOUNTER — Ambulatory Visit: Payer: Medicare Other

## 2015-05-24 ENCOUNTER — Other Ambulatory Visit: Payer: Self-pay | Admitting: Nurse Practitioner

## 2015-05-24 ENCOUNTER — Other Ambulatory Visit: Payer: Self-pay | Admitting: Internal Medicine

## 2015-05-25 ENCOUNTER — Encounter: Payer: Self-pay | Admitting: Nurse Practitioner

## 2015-05-25 ENCOUNTER — Ambulatory Visit (INDEPENDENT_AMBULATORY_CARE_PROVIDER_SITE_OTHER): Payer: Medicare Other

## 2015-05-25 DIAGNOSIS — J309 Allergic rhinitis, unspecified: Secondary | ICD-10-CM

## 2015-05-29 ENCOUNTER — Ambulatory Visit: Payer: Medicare Other

## 2015-05-30 ENCOUNTER — Ambulatory Visit (INDEPENDENT_AMBULATORY_CARE_PROVIDER_SITE_OTHER): Payer: Medicare Other

## 2015-05-30 DIAGNOSIS — J309 Allergic rhinitis, unspecified: Secondary | ICD-10-CM | POA: Diagnosis not present

## 2015-06-05 ENCOUNTER — Ambulatory Visit (INDEPENDENT_AMBULATORY_CARE_PROVIDER_SITE_OTHER): Payer: Medicare Other

## 2015-06-05 DIAGNOSIS — J309 Allergic rhinitis, unspecified: Secondary | ICD-10-CM | POA: Diagnosis not present

## 2015-06-12 ENCOUNTER — Ambulatory Visit (INDEPENDENT_AMBULATORY_CARE_PROVIDER_SITE_OTHER): Payer: Medicare Other

## 2015-06-12 DIAGNOSIS — J309 Allergic rhinitis, unspecified: Secondary | ICD-10-CM | POA: Diagnosis not present

## 2015-06-19 ENCOUNTER — Ambulatory Visit (INDEPENDENT_AMBULATORY_CARE_PROVIDER_SITE_OTHER): Payer: Medicare Other

## 2015-06-19 DIAGNOSIS — J309 Allergic rhinitis, unspecified: Secondary | ICD-10-CM

## 2015-06-23 ENCOUNTER — Other Ambulatory Visit: Payer: Self-pay | Admitting: Internal Medicine

## 2015-06-23 ENCOUNTER — Ambulatory Visit
Admission: RE | Admit: 2015-06-23 | Discharge: 2015-06-23 | Disposition: A | Discharge status: Home / Self Care | Payer: Medicare Other | Source: Ambulatory Visit | Attending: Nurse Practitioner | Admitting: Nurse Practitioner

## 2015-06-23 DIAGNOSIS — M85851 Other specified disorders of bone density and structure, right thigh: Secondary | ICD-10-CM | POA: Diagnosis not present

## 2015-06-23 DIAGNOSIS — E2839 Other primary ovarian failure: Secondary | ICD-10-CM

## 2015-06-26 ENCOUNTER — Ambulatory Visit (INDEPENDENT_AMBULATORY_CARE_PROVIDER_SITE_OTHER): Payer: Medicare Other

## 2015-06-26 DIAGNOSIS — J309 Allergic rhinitis, unspecified: Secondary | ICD-10-CM | POA: Diagnosis not present

## 2015-06-29 ENCOUNTER — Other Ambulatory Visit: Payer: Self-pay | Admitting: Nurse Practitioner

## 2015-06-29 DIAGNOSIS — M810 Age-related osteoporosis without current pathological fracture: Secondary | ICD-10-CM

## 2015-06-29 DIAGNOSIS — Z961 Presence of intraocular lens: Secondary | ICD-10-CM | POA: Diagnosis not present

## 2015-06-29 MED ORDER — ALENDRONATE SODIUM 70 MG PO TABS: 70.0000 mg | ORAL_TABLET | ORAL | Status: DC

## 2015-07-03 ENCOUNTER — Ambulatory Visit (INDEPENDENT_AMBULATORY_CARE_PROVIDER_SITE_OTHER): Payer: Medicare Other

## 2015-07-03 DIAGNOSIS — J309 Allergic rhinitis, unspecified: Secondary | ICD-10-CM | POA: Diagnosis not present

## 2015-07-10 ENCOUNTER — Ambulatory Visit (INDEPENDENT_AMBULATORY_CARE_PROVIDER_SITE_OTHER): Payer: Medicare Other

## 2015-07-10 DIAGNOSIS — J309 Allergic rhinitis, unspecified: Secondary | ICD-10-CM

## 2015-07-17 ENCOUNTER — Ambulatory Visit (INDEPENDENT_AMBULATORY_CARE_PROVIDER_SITE_OTHER): Payer: Medicare Other | Admitting: *Deleted

## 2015-07-17 DIAGNOSIS — J309 Allergic rhinitis, unspecified: Secondary | ICD-10-CM

## 2015-07-24 ENCOUNTER — Ambulatory Visit (INDEPENDENT_AMBULATORY_CARE_PROVIDER_SITE_OTHER): Payer: Medicare Other | Admitting: *Deleted

## 2015-07-24 DIAGNOSIS — J309 Allergic rhinitis, unspecified: Secondary | ICD-10-CM

## 2015-07-31 ENCOUNTER — Ambulatory Visit (INDEPENDENT_AMBULATORY_CARE_PROVIDER_SITE_OTHER): Payer: Medicare Other | Admitting: *Deleted

## 2015-07-31 DIAGNOSIS — J309 Allergic rhinitis, unspecified: Secondary | ICD-10-CM

## 2015-08-06 ENCOUNTER — Other Ambulatory Visit: Payer: Self-pay | Admitting: Nurse Practitioner

## 2015-08-07 ENCOUNTER — Ambulatory Visit (INDEPENDENT_AMBULATORY_CARE_PROVIDER_SITE_OTHER): Payer: Medicare Other | Admitting: *Deleted

## 2015-08-07 DIAGNOSIS — J309 Allergic rhinitis, unspecified: Secondary | ICD-10-CM

## 2015-08-08 ENCOUNTER — Telehealth: Payer: Self-pay | Admitting: *Deleted

## 2015-08-08 ENCOUNTER — Emergency Department (INDEPENDENT_AMBULATORY_CARE_PROVIDER_SITE_OTHER)
Admission: EM | Admit: 2015-08-08 | Discharge: 2015-08-08 | Disposition: A | Discharge status: Home / Self Care | Payer: Medicare Other | Source: Home / Self Care | Attending: Family Medicine | Admitting: Family Medicine

## 2015-08-08 ENCOUNTER — Emergency Department (INDEPENDENT_AMBULATORY_CARE_PROVIDER_SITE_OTHER): Payer: Medicare Other

## 2015-08-08 ENCOUNTER — Encounter (HOSPITAL_COMMUNITY): Payer: Self-pay | Admitting: Emergency Medicine

## 2015-08-08 DIAGNOSIS — R300 Dysuria: Secondary | ICD-10-CM

## 2015-08-08 DIAGNOSIS — G8929 Other chronic pain: Secondary | ICD-10-CM

## 2015-08-08 DIAGNOSIS — R0602 Shortness of breath: Secondary | ICD-10-CM | POA: Diagnosis not present

## 2015-08-08 DIAGNOSIS — M546 Pain in thoracic spine: Secondary | ICD-10-CM

## 2015-08-08 DIAGNOSIS — R5383 Other fatigue: Secondary | ICD-10-CM

## 2015-08-08 DIAGNOSIS — R059 Cough, unspecified: Secondary | ICD-10-CM

## 2015-08-08 DIAGNOSIS — R05 Cough: Secondary | ICD-10-CM | POA: Diagnosis not present

## 2015-08-08 LAB — POCT I-STAT, CHEM 8
BUN: 22 mg/dL — AB (ref 6–20)
CHLORIDE: 102 mmol/L (ref 101–111)
Calcium, Ion: 1.24 mmol/L (ref 1.13–1.30)
Creatinine, Ser: 0.7 mg/dL (ref 0.44–1.00)
Glucose, Bld: 94 mg/dL (ref 65–99)
HEMATOCRIT: 44 % (ref 36.0–46.0)
Hemoglobin: 15 g/dL (ref 12.0–15.0)
POTASSIUM: 3.6 mmol/L (ref 3.5–5.1)
SODIUM: 140 mmol/L (ref 135–145)
TCO2: 28 mmol/L (ref 0–100)

## 2015-08-08 NOTE — ED Provider Notes (Addendum)
CSN: [id number]     Arrival date & time 08/08/15  1306 History   First MD Initiated Contact with Patient 08/08/15 1439     Chief Complaint  Patient presents with  . Back Pain   (Consider location/radiation/quality/duration/timing/severity/associated sxs/prior Treatment) Patient is a 75 y.o. female presenting with back pain. The history is provided by the patient. No language interpreter was used.  Back Pain Associated symptoms: no abdominal pain, no chest pain and no fever    Patient with complaint of mid-thoracic back pain to the L of midline, started 2 months ago. She has a history of recurrent bronchitis and seasonal allergies, had an episode of bronchitis in Nov 2016 which resolved. In January 2017 she began with cough again, developed pain to the L of midline which has worsened progressively over time and is associated with fatigue.  The pain is worse with deep breathing and cough.  She denies fevers or chills, no chest pain or palpitations.  She reports that she has had some urinary frequency and dysuria which have developed in the past several weeks, lost control of her urine once in that interim.   Medications include Zyrtec, Flonase, nasal saline for her allergic rhinitis.  She takes regular allergy shots as well.   Social Hx; Quit smoking 51 years ago (at age 67).  Past Medical History  Diagnosis Date  . ALLERGIC RHINITIS   . Hypertension   . Asthma   . Hypercholesterolemia     borderline  . GERD (gastroesophageal reflux disease)   . Diverticulosis of colon   . IBS (irritable bowel syndrome)   . Colon polyp   . History of nephrolithiasis   . Osteoarthritis   . Fibromyalgia   . Vitamin D deficiency   . Postconcussion syndrome   . Cervical strain, acute   . Dizziness   . Anxiety   . Personal history of allergy to unspecified medicinal agent   . Blood type O+    Past Surgical History  Procedure Laterality Date  . Cholecystectomy, laparoscopic  2004    for gallstone  pancreatitis  . Thyroid surgery    . Knee surgery Right   . Knee surgery Left    Family History  Problem Relation Age of Onset  . Breast cancer Mother   . Alzheimer's disease Mother   . Heart disease Mother   . Ovarian cancer Paternal Grandmother   . Arthritis Other     Cousins   . COPD Brother   . COPD Cousin     Maternal side   . Heart attack Maternal Uncle   . Heart disease Sister   . Alcohol abuse Sister    Social History  Substance Use Topics  . Smoking status: Former Smoker -- 2.00 packs/day for 8 years    Types: Cigarettes    Quit date: 05/13/1962  . Smokeless tobacco: Never Used  . Alcohol Use: No   OB History    No data available     Review of Systems  Constitutional: Positive for fatigue. Negative for fever, chills and diaphoresis.  Respiratory: Positive for cough and shortness of breath. Negative for chest tightness and wheezing.   Cardiovascular: Negative for chest pain.  Gastrointestinal: Negative for nausea, abdominal pain, diarrhea, blood in stool, anal bleeding and rectal pain.  Musculoskeletal: Positive for back pain.    Allergies  Ace inhibitors; Amoxicillin; Benicar hct; Budesonide-formoterol fumarate; Chlorzoxazone; Citalopram hydrobromide; Citalopram hydrobromide; Clonidine derivatives; Droperidol; Flexeril; Ketorolac tromethamine; Ketorolac tromethamine; Metoclopramide hcl; Mometasone furo-formoterol fum;  Morphine; and Olmesartan medoxomil  Home Medications   Prior to Admission medications   Medication Sig Start Date End Date Taking? Authorizing Provider  acetaminophen (TYLENOL) 500 MG tablet Take 500 mg by mouth every 6 (six) hours as needed (for headache as needed).     Historical Provider, MD  ADVAIR DISKUS 250-50 MCG/DOSE AEPB INHALE 1 PUFF BY MOUTH TWICE DAILY. RINSE MOUTH AFTER USE 06/23/15   Deneise Lever, MD  albuterol (PROVENTIL HFA;VENTOLIN HFA) 108 (90 BASE) MCG/ACT inhaler Inhale 2 puffs into the lungs every 4 (four) hours as needed  for wheezing or shortness of breath. RESCUE 02/21/14 03/29/16  Deneise Lever, MD  alendronate (FOSAMAX) 70 MG tablet Take 1 tablet (70 mg total) by mouth every 7 (seven) days. Take with a full glass of water on an empty stomach. 06/29/15   Lauree Chandler, NP  amLODipine (NORVASC) 5 MG tablet TAKE 1 TABLET BY MOUTH DAILY FOR BLOOD PRESSURE 03/24/15   Lauree Chandler, NP  aspirin 81 MG EC tablet Take 81 mg by mouth daily.      Historical Provider, MD  Cetirizine HCl (ZYRTEC ALLERGY) 10 MG CAPS Take 1 capsule by mouth daily.      Historical Provider, MD  Cholecalciferol (VITAMIN D3) 1000 UNITS CAPS Take by mouth daily.      Historical Provider, MD  famotidine (PEPCID) 20 MG tablet Take 20 mg by mouth 2 (two) times daily.    Historical Provider, MD  fluticasone (FLONASE) 50 MCG/ACT nasal spray 1-2 SPRAYS EACH NOSTRIL TWICE DAILY 05/24/15   Deneise Lever, MD  hydrochlorothiazide (MICROZIDE) 12.5 MG capsule TAKE 1 CAPSULE BY MOUTH EVERY DAY 01/24/15   Lauree Chandler, NP  hydrochlorothiazide (MICROZIDE) 12.5 MG capsule TAKE 1 CAPSULE BY MOUTH EVERY DAY 05/24/15   Lauree Chandler, NP  Injection Device MISC by Does not apply route. Allergy injection once every Monday, Administered by Dr.Young    Historical Provider, MD  meclizine (ANTIVERT) 25 MG tablet TAKE 1 TABLET 3 TIMES A DAY AS NEEDED FOR DIZZINESS/NAUSEA 08/20/12   Noralee Space, MD  Multiple Vitamins-Minerals (CENTRUM SILVER PO) Take by mouth daily.      Historical Provider, MD  NONFORMULARY OR COMPOUNDED ITEM Allergy Vaccine 1:10 Given at [facility]    Historical Provider, MD  Omega-3 Fatty Acids (FISH OIL MAXIMUM STRENGTH) 1200 MG CAPS Take by mouth. 2 by mouth daily    Historical Provider, MD  pantoprazole (PROTONIX) 40 MG tablet TAKE 1 TABLET BY MOUTH EVERY DAY 08/07/15   Lauree Chandler, NP  potassium chloride SA (K-DUR,KLOR-CON) 20 MEQ tablet TAKE 1 TABLET BY MOUTH DAILY 05/24/15   Lauree Chandler, NP  Propylene Glycol  (SYSTANE BALANCE) 0.6 % SOLN Place 2 drops into both eyes 2 (two) times daily.      Historical Provider, MD  SALINE NASAL SPRAY NA Place 88 mcg into the nose 2 (two) times daily.    Historical Provider, MD   Meds Ordered and Administered this Visit  Medications - No data to display  BP 143/55 mmHg  Pulse 69  Temp(Src) 99.4 F (37.4 C) (Oral)  Resp 16  SpO2 99% No data found.   Physical Exam  Constitutional: She appears well-developed and well-nourished. No distress.  HENT:  Head: Normocephalic and atraumatic.  Right Ear: External ear normal.  Left Ear: External ear normal.  Mouth/Throat: Oropharynx is clear and moist. No oropharyngeal exudate.  Eyes: Conjunctivae and EOM are normal. Right eye exhibits no  discharge. Left eye exhibits no discharge. No scleral icterus.  No conjunctival pallor  Neck: Neck supple.  Pulmonary/Chest: Effort normal. No respiratory distress. She has no wheezes. She has rales. She exhibits no tenderness.  Palpable tenderness to the L of midline at around T-10 level; area of redness (after much palpation and trying to examine) No point tenderness over vertebral processes.   Good air movement without wheezes. Basilar crackles heard on the L side only.   Abdominal: Soft. She exhibits no distension and no mass. There is no tenderness. There is no rebound and no guarding.  Lymphadenopathy:    She has no cervical adenopathy.  Skin: She is not diaphoretic.    ED Course  Procedures (including critical care time)  Labs Review Labs Reviewed - No data to display  Imaging Review No results found.   Visual Acuity Review  Right Eye Distance:   Left Eye Distance:   Bilateral Distance:    Right Eye Near:   Left Eye Near:    Bilateral Near:         MDM   1. Chronic left-sided thoracic back pain   2. Cough   3. Dysuria   4. Other fatigue    Patient with constellation of sxs including back pain to L of midline; fatigue; dysuria; cough.  For XR  and UA, iStat labs.    CXR reviewed by me, no evidence of infiltrate.  Istat labs without anemia; no other explanation for her complaint.  Discussed results with her, encouraged to follow up with her primary doctor, Dr Carlota Raspberry. In the interim to use warm compresses, stretching.     Willeen Niece, MD 08/08/15 [place], MD 08/08/15 [phone]

## 2015-08-08 NOTE — ED Notes (Signed)
Reports pain under left shoulder blade, left back-pain for 2 months.  Patient is tired.  Feeling tired for 2 months.  Reports clear, thick phlegm for 2 weeks.  Pain is back is now sharp with coughing.

## 2015-08-08 NOTE — ED Notes (Signed)
Patient sent to the bathroom for urine specimen, patient can not void at this time

## 2015-08-08 NOTE — Discharge Instructions (Signed)
It is a pleasure to see you today.  The blood work and the chest x-ray do not give a clear explanation for the mid-back pain or the tiredness.  We were unable to obtain a urine specimen for the urine tests.   I recommend using warm compresses and gentle stretches periodically; follow up with your primary doctor, Dr. Carlota Raspberry, for further evaluation.

## 2015-08-08 NOTE — Telephone Encounter (Signed)
Patient called and stated that she was having severe pain between the shoulder blades to the point it is hard to take a deep breath. Also stated she thinks she has a UTI. Patient wants to be seen today, no available appointment.  Instructed patient to go to Urgent Care. Patient agreed.

## 2015-08-14 ENCOUNTER — Ambulatory Visit (INDEPENDENT_AMBULATORY_CARE_PROVIDER_SITE_OTHER): Payer: Medicare Other | Admitting: Internal Medicine

## 2015-08-14 ENCOUNTER — Encounter: Payer: Self-pay | Admitting: Internal Medicine

## 2015-08-14 VITALS — BP 118/76 | HR 88 | Temp 98.0°F | Resp 20 | Ht 64.0 in | Wt 177.8 lb

## 2015-08-14 DIAGNOSIS — R350 Frequency of micturition: Secondary | ICD-10-CM | POA: Diagnosis not present

## 2015-08-14 DIAGNOSIS — J4520 Mild intermittent asthma, uncomplicated: Secondary | ICD-10-CM

## 2015-08-14 DIAGNOSIS — M546 Pain in thoracic spine: Secondary | ICD-10-CM

## 2015-08-14 DIAGNOSIS — J0110 Acute frontal sinusitis, unspecified: Secondary | ICD-10-CM | POA: Diagnosis not present

## 2015-08-14 LAB — POCT URINALYSIS DIPSTICK
Bilirubin, UA: NEGATIVE
Blood, UA: NEGATIVE
Glucose, UA: NEGATIVE
Ketones, UA: NEGATIVE
Leukocytes, UA: NEGATIVE
Nitrite, UA: NEGATIVE
Spec Grav, UA: 1.015
Urobilinogen, UA: 0.2
pH, UA: 5

## 2015-08-14 MED ORDER — DOXYCYCLINE HYCLATE 100 MG PO TABS: 100.0000 mg | ORAL_TABLET | Freq: Two times a day (BID) | ORAL | Status: DC

## 2015-08-14 NOTE — Progress Notes (Signed)
Patient ID: [name], female   DOB: 01-24-1941, 75 y.o.   MRN: [id number]   Location:  [facility] Provider: Saralyn Willison L. Mariea Clonts, D.O., C.M.D.  Goals of Care:  Advanced Directives 08/14/2015  Does patient have an advance directive? No  Would patient like information on creating an advanced directive? Yes - Geneticist, molecular Complaint  Patient presents with  . Medical Management of Chronic Issues    TOC from Urgent follow-up    HPI: Patient is a 75 y.o. female seen today for an acute visit for f/u after urgent care visit for left-sided sharp thoracic pain x 2 mos, cough with thick clear phlegm, fatigue and dysuria. Labs were unremarkable (WBC 6.2, blood counts normal, BUN and creatinine normal and warm compresses and stretching were recommended for the pain.  She was unable to provide a urine sample.  CXR was negative for acute disease--no effusion, no pneumonia.    Feels worse than she did that day (last tues 3/28).  Now has pulsating type feeling in her ears or frontal area of her face. Has had allergies her life and coughs and sneezes more in spring.  She also still has left shoulder pain that makes it hard to rest on her left side.  Rhinorrhea started now today.    When she went to pulmonary, [facility], she was felt to have a bronchitis (pt reports). Had been around Thanksgiving. She seems anxious about this and asks if she might have lung cancer.  Has chronic left hip pain.    Is having urinary frequency at night.  Could not do her urine sample at the urgent care and this has only worsened.  Denies fever, pain related to this.    Requests advair sample. Past Medical History  Diagnosis Date  . ALLERGIC RHINITIS   . Hypertension   . Asthma   . Hypercholesterolemia     borderline  . GERD (gastroesophageal reflux disease)   . Diverticulosis of colon   . IBS (irritable bowel syndrome)   . Colon polyp   . History of nephrolithiasis   . Osteoarthritis   .  Fibromyalgia   . Vitamin D deficiency   . Postconcussion syndrome   . Cervical strain, acute   . Dizziness   . Anxiety   . Personal history of allergy to unspecified medicinal agent   . Blood type O+     Past Surgical History  Procedure Laterality Date  . Cholecystectomy, laparoscopic  2004    for gallstone pancreatitis  . Thyroid surgery    . Knee surgery Right   . Knee surgery Left     Allergies  Allergen Reactions  . Ace Inhibitors   . Amoxicillin     REACTION: unknown? pain in right kidney  . Benicar Hct [Olmesartan Medoxomil-Hctz]     Extreme weakness  . Budesonide-Formoterol Fumarate     Causes tremors and numbness  . Chlorzoxazone [Chlorzoxazone]   . Citalopram Hydrobromide     REACTION: hives  . Citalopram Hydrobromide   . Clonidine Derivatives   . Droperidol     REACTION: hives  . Flexeril [Cyclobenzaprine Hcl]   . Ketorolac Tromethamine     REACTION: hives  . Ketorolac Tromethamine   . Metoclopramide Hcl   . Mometasone Furo-Formoterol Fum     Causes sore throat, blurred vision and unable to sleep--started on med 09-17-10  . Morphine     REACTION: hives and itching  . Olmesartan Medoxomil  REACTION: fatique      Medication List       This list is accurate as of: 08/14/15 10:18 AM.  Always use your most recent med list.               acetaminophen 500 MG tablet  Commonly known as:  TYLENOL  Take 500 mg by mouth every 6 (six) hours as needed (for headache as needed).     ADVAIR DISKUS 250-50 MCG/DOSE Aepb  Generic drug:  Fluticasone-Salmeterol  INHALE 1 PUFF BY MOUTH TWICE DAILY. RINSE MOUTH AFTER USE     albuterol 108 (90 Base) MCG/ACT inhaler  Commonly known as:  PROVENTIL HFA;VENTOLIN HFA  Inhale 2 puffs into the lungs every 4 (four) hours as needed for wheezing or shortness of breath. RESCUE     alendronate 70 MG tablet  Commonly known as:  FOSAMAX  Take 1 tablet (70 mg total) by mouth every 7 (seven) days. Take with a full glass of  water on an empty stomach.     amLODipine 5 MG tablet  Commonly known as:  NORVASC  TAKE 1 TABLET BY MOUTH DAILY FOR BLOOD PRESSURE     aspirin 81 MG EC tablet  Take 81 mg by mouth daily.     CENTRUM SILVER PO  Take by mouth daily.     famotidine 20 MG tablet  Commonly known as:  PEPCID  Take 20 mg by mouth 2 (two) times daily.     FISH OIL MAXIMUM STRENGTH 1200 MG Caps  Take by mouth. 2 by mouth daily     fluticasone 50 MCG/ACT nasal spray  Commonly known as:  FLONASE  1-2 SPRAYS EACH NOSTRIL TWICE DAILY     hydrochlorothiazide 12.5 MG capsule  Commonly known as:  MICROZIDE  TAKE 1 CAPSULE BY MOUTH EVERY DAY     Injection Device Misc  by Does not apply route. Allergy injection once every Monday, Administered by Dr.Young     meclizine 25 MG tablet  Commonly known as:  ANTIVERT  TAKE 1 TABLET 3 TIMES A DAY AS NEEDED FOR DIZZINESS/NAUSEA     NONFORMULARY OR COMPOUNDED ITEM  Allergy Vaccine 1:10 Given at [facility]     pantoprazole 40 MG tablet  Commonly known as:  PROTONIX  TAKE 1 TABLET BY MOUTH EVERY DAY     potassium chloride SA 20 MEQ tablet  Commonly known as:  K-DUR,KLOR-CON  TAKE 1 TABLET BY MOUTH DAILY     SALINE NASAL SPRAY NA  Place 88 mcg into the nose 2 (two) times daily.     SYSTANE BALANCE 0.6 % Soln  Generic drug:  Propylene Glycol  Place 2 drops into both eyes 2 (two) times daily.     Vitamin D3 1000 units Caps  Take by mouth daily.     ZYRTEC ALLERGY 10 MG Caps  Generic drug:  Cetirizine HCl  Take 1 capsule by mouth daily.        Review of Systems:  ROS  Health Maintenance  Topic Date Due  . ZOSTAVAX  09/14/1998  . COLONOSCOPY  05/13/2018 (Originally 07/11/2013)  . PNA vac Low Risk Adult (2 of 2 - PCV13) 08/25/2015  . INFLUENZA VACCINE  12/12/2015  . TETANUS/TDAP  12/07/2023  . DEXA SCAN  Completed    Physical Exam: Filed Vitals:   08/14/15 1008  BP: 118/76  Pulse: 88  Temp: 98 F (36.7 C)  TempSrc: Oral  Resp: 20    Height: 5' 4"  (1.626  m)  Weight: 177 lb 12.8 oz (80.65 kg)  SpO2: 97%   Body mass index is 30.5 kg/(m^2). Physical Exam  Labs reviewed: Basic Metabolic Panel:  Recent Labs  09/23/14 0809 12/27/14 1011 03/30/15 1032 08/08/15 1519  NA 142 142 142 140  K 3.5 4.1 3.8 3.6  CL 100 100 102 102  CO2 27 26 22   --   GLUCOSE 105* 111* 105* 94  BUN 18 17 17  22*  CREATININE 0.63 0.70 0.75 0.70  CALCIUM 9.4 9.4 9.8  --   TSH 3.080  --   --   --    Liver Function Tests:  Recent Labs  09/23/14 0809 03/30/15 1032  AST 22 28  ALT 15 21  ALKPHOS 97 91  BILITOT 0.5 0.5  PROT 6.5 6.7  ALBUMIN 4.3 4.5   No results for input(s): LIPASE, AMYLASE in the last 8760 hours. No results for input(s): AMMONIA in the last 8760 hours. CBC:  Recent Labs  09/23/14 0809 03/30/15 1032 08/08/15 1519  WBC 7.0 6.2  --   NEUTROABS 2.6 2.9  --   HGB  --   --  15.0  HCT 41.1 42.0 44.0  MCV 85 84  --   PLT 309 385*  --    Lipid Panel:  Recent Labs  09/23/14 0809 03/30/15 1032  CHOL 222* 218*  HDL 63 53  LDLCALC 134* 134*  TRIG 126 153*  CHOLHDL 3.5 4.1   Lab Results  Component Value Date   HGBA1C 5.6 12/29/2014    Procedures since last visit: Dg Chest 2 View  08/08/2015  CLINICAL DATA:  Mid back pain for 2 months. Intermittent shortness of breath. No known injury. Initial encounter. EXAM: CHEST  2 VIEW COMPARISON:  PA and lateral chest 02/23/2015. FINDINGS: The lungs are clear. Heart size is normal. No pneumothorax or pleural effusion. Thoracic spondylosis noted. IMPRESSION: No acute disease. Electronically Signed   By: Inge Rise M.D.   On: 08/08/2015 15:33    Assessment/Plan 1. Left-sided thoracic back pain -seems muscular--has reproducible area of tenderness over left shoulder blade -CXR was negative at urgent care -cont with heating pad and at other times aspercreme  2. Asthma, allergic, mild intermittent, uncomplicated - due to her asthma, will treat her infection  as she is not getting rid of this cough and it's been 3 wks: doxycycline (VIBRA-TABS) 100 MG tablet; Take 1 tablet (100 mg total) by mouth 2 (two) times daily.  Dispense: 20 tablet; Refill: 0  3. Subacute frontal sinusitis - seems this is getting worse with frontal headache and rhinitis, some difficulty with equilibrium - doxycycline (VIBRA-TABS) 100 MG tablet; Take 1 tablet (100 mg total) by mouth 2 (two) times daily.  Dispense: 20 tablet; Refill: 0  4. Urinary frequency - POCT urinalysis dipstick was negative for any signs of infection -cont to monitor -if persists, PCP to evaluate further for overactive bladder and other potential causes  Labs/tests ordered:   Orders Placed This Encounter  Procedures  . POCT urinalysis dipstick  . POC Urinalysis Dipstick   Next appt:  10/31/2015  Shankar Silber L. Wendelin Reader, D.O. [facility] [address], [place] [address] Cell Phone (Mon-Fri 8am-5pm):  [phone] On Call:  [phone] & follow prompts after 5pm & weekends Office Phone:  [phone] Office Fax:  [phone]

## 2015-08-14 NOTE — Patient Instructions (Signed)
Cont use of aspercreme Drink plenty of fluids May continue heating pad for left shoulder area--seems muscles are sore from coughing Pick up doxycycline for her

## 2015-08-21 ENCOUNTER — Ambulatory Visit: Payer: Medicare Other

## 2015-08-23 ENCOUNTER — Ambulatory Visit (INDEPENDENT_AMBULATORY_CARE_PROVIDER_SITE_OTHER): Payer: Medicare Other | Admitting: *Deleted

## 2015-08-23 DIAGNOSIS — J309 Allergic rhinitis, unspecified: Secondary | ICD-10-CM

## 2015-08-28 ENCOUNTER — Ambulatory Visit (INDEPENDENT_AMBULATORY_CARE_PROVIDER_SITE_OTHER): Payer: Medicare Other | Admitting: *Deleted

## 2015-08-28 DIAGNOSIS — J309 Allergic rhinitis, unspecified: Secondary | ICD-10-CM | POA: Diagnosis not present

## 2015-09-01 DIAGNOSIS — C44311 Basal cell carcinoma of skin of nose: Secondary | ICD-10-CM | POA: Diagnosis not present

## 2015-09-01 DIAGNOSIS — Z85828 Personal history of other malignant neoplasm of skin: Secondary | ICD-10-CM | POA: Diagnosis not present

## 2015-09-01 DIAGNOSIS — L821 Other seborrheic keratosis: Secondary | ICD-10-CM | POA: Diagnosis not present

## 2015-09-01 DIAGNOSIS — L570 Actinic keratosis: Secondary | ICD-10-CM | POA: Diagnosis not present

## 2015-09-01 DIAGNOSIS — L820 Inflamed seborrheic keratosis: Secondary | ICD-10-CM | POA: Diagnosis not present

## 2015-09-04 ENCOUNTER — Ambulatory Visit (INDEPENDENT_AMBULATORY_CARE_PROVIDER_SITE_OTHER): Payer: Medicare Other | Admitting: *Deleted

## 2015-09-04 DIAGNOSIS — J309 Allergic rhinitis, unspecified: Secondary | ICD-10-CM | POA: Diagnosis not present

## 2015-09-11 ENCOUNTER — Ambulatory Visit (INDEPENDENT_AMBULATORY_CARE_PROVIDER_SITE_OTHER): Payer: Medicare Other | Admitting: *Deleted

## 2015-09-11 DIAGNOSIS — J309 Allergic rhinitis, unspecified: Secondary | ICD-10-CM | POA: Diagnosis not present

## 2015-09-18 ENCOUNTER — Ambulatory Visit (INDEPENDENT_AMBULATORY_CARE_PROVIDER_SITE_OTHER): Payer: Medicare Other | Admitting: *Deleted

## 2015-09-18 DIAGNOSIS — J309 Allergic rhinitis, unspecified: Secondary | ICD-10-CM | POA: Diagnosis not present

## 2015-09-22 ENCOUNTER — Telehealth: Payer: Self-pay | Admitting: Internal Medicine

## 2015-09-22 DIAGNOSIS — J309 Allergic rhinitis, unspecified: Secondary | ICD-10-CM | POA: Diagnosis not present

## 2015-09-22 NOTE — Telephone Encounter (Signed)
Allergy Serum Extract Date Mixed: 09/22/15 Vial: 2 Strength: 1:10 Here/Mail/Pick Up: here Mixed By: tbs Last OV: 05/12/15 Pending OV: n/a

## 2015-09-25 ENCOUNTER — Ambulatory Visit (INDEPENDENT_AMBULATORY_CARE_PROVIDER_SITE_OTHER): Payer: Medicare Other | Admitting: *Deleted

## 2015-09-25 DIAGNOSIS — J309 Allergic rhinitis, unspecified: Secondary | ICD-10-CM

## 2015-09-28 ENCOUNTER — Encounter: Payer: Self-pay | Admitting: Internal Medicine

## 2015-09-28 DIAGNOSIS — C44311 Basal cell carcinoma of skin of nose: Secondary | ICD-10-CM | POA: Diagnosis not present

## 2015-10-02 ENCOUNTER — Ambulatory Visit (INDEPENDENT_AMBULATORY_CARE_PROVIDER_SITE_OTHER): Payer: Medicare Other | Admitting: *Deleted

## 2015-10-02 DIAGNOSIS — J309 Allergic rhinitis, unspecified: Secondary | ICD-10-CM | POA: Diagnosis not present

## 2015-10-16 ENCOUNTER — Ambulatory Visit (INDEPENDENT_AMBULATORY_CARE_PROVIDER_SITE_OTHER): Payer: Medicare Other | Admitting: *Deleted

## 2015-10-16 DIAGNOSIS — J309 Allergic rhinitis, unspecified: Secondary | ICD-10-CM | POA: Diagnosis not present

## 2015-10-23 ENCOUNTER — Ambulatory Visit (INDEPENDENT_AMBULATORY_CARE_PROVIDER_SITE_OTHER): Payer: Medicare Other | Admitting: *Deleted

## 2015-10-23 ENCOUNTER — Ambulatory Visit (INDEPENDENT_AMBULATORY_CARE_PROVIDER_SITE_OTHER): Payer: Medicare Other | Admitting: Nurse Practitioner

## 2015-10-23 ENCOUNTER — Other Ambulatory Visit: Payer: Self-pay

## 2015-10-23 ENCOUNTER — Encounter: Payer: Self-pay | Admitting: Nurse Practitioner

## 2015-10-23 VITALS — BP 126/86 | HR 93 | Temp 97.9°F | Resp 18 | Ht 64.0 in | Wt 183.0 lb

## 2015-10-23 DIAGNOSIS — B373 Candidiasis of vulva and vagina: Secondary | ICD-10-CM

## 2015-10-23 DIAGNOSIS — R109 Unspecified abdominal pain: Secondary | ICD-10-CM

## 2015-10-23 DIAGNOSIS — B3731 Acute candidiasis of vulva and vagina: Secondary | ICD-10-CM

## 2015-10-23 DIAGNOSIS — J309 Allergic rhinitis, unspecified: Secondary | ICD-10-CM

## 2015-10-23 LAB — POCT URINALYSIS DIPSTICK
BILIRUBIN UA: NEGATIVE
GLUCOSE UA: NEGATIVE
Ketones, UA: NEGATIVE
Leukocytes, UA: NEGATIVE
Nitrite, UA: NEGATIVE
Protein, UA: NEGATIVE
RBC UA: NEGATIVE
SPEC GRAV UA: 1.01
Urobilinogen, UA: 0.2
pH, UA: 6

## 2015-10-23 NOTE — Progress Notes (Signed)
Patient ID: [name], female   DOB: March 22, 1941, 75 y.o.   MRN: [id number]    PCP: Lauree Chandler, NP  Advanced Directive information Does patient have an advance directive?: No  Allergies  Allergen Reactions  . Ace Inhibitors   . Amoxicillin     REACTION: unknown? pain in right kidney  . Benicar Hct [Olmesartan Medoxomil-Hctz]     Extreme weakness  . Budesonide-Formoterol Fumarate     Causes tremors and numbness  . Chlorzoxazone [Chlorzoxazone]   . Citalopram Hydrobromide     REACTION: hives  . Citalopram Hydrobromide   . Clonidine Derivatives   . Droperidol     REACTION: hives  . Flexeril [Cyclobenzaprine Hcl]   . Ketorolac Tromethamine     REACTION: hives  . Ketorolac Tromethamine   . Metoclopramide Hcl   . Mometasone Furo-Formoterol Fum     Causes sore throat, blurred vision and unable to sleep--started on med 09-17-10  . Morphine     REACTION: hives and itching  . Olmesartan Medoxomil     REACTION: fatique    Chief Complaint  Patient presents with  . Acute Visit    right flank pain x 3 days. constant, sharp ache. eases with movement  . OTHER    Patient does not think she is supposed to take fosamax     HPI: Patient is a 75 y.o. female seen in the office today due to right sided back pain for a few weeks. Worse the last few days. Better when she moves around and uses heating pad. No pain with urination. Not doing much activity. Reports she is in a rut. No injury.  Takes tylenol which helps it. Sharp achy pain 8/10. Comes and goes. Moving around helps it. Worse when she first wakes up.   Also having vaginal pain for a couple of months when she showers. No drainage or discharge. Painful when she cleans herself.    Review of Systems:  Review of Systems  Constitutional: Negative for chills, activity change, appetite change, fatigue and unexpected weight change.  Eyes: Negative.   Respiratory: Negative for cough and shortness of breath.   Cardiovascular:  Negative for chest pain, palpitations and leg swelling.  Gastrointestinal: Negative for abdominal pain, diarrhea and constipation.  Genitourinary: Positive for flank pain and vaginal pain. Negative for dysuria, urgency, frequency, hematuria, decreased urine volume, vaginal bleeding, vaginal discharge, difficulty urinating and pelvic pain.  Musculoskeletal: Positive for myalgias.  Skin: Negative for rash and wound.  Neurological: Negative for dizziness and weakness.    Past Medical History  Diagnosis Date  . ALLERGIC RHINITIS   . Hypertension   . Asthma   . Hypercholesterolemia     borderline  . GERD (gastroesophageal reflux disease)   . Diverticulosis of colon   . IBS (irritable bowel syndrome)   . Colon polyp   . History of nephrolithiasis   . Osteoarthritis   . Fibromyalgia   . Vitamin D deficiency   . Postconcussion syndrome   . Cervical strain, acute   . Dizziness   . Anxiety   . Personal history of allergy to unspecified medicinal agent   . Blood type O+    Past Surgical History  Procedure Laterality Date  . Cholecystectomy, laparoscopic  2004    for gallstone pancreatitis  . Thyroid surgery    . Knee surgery Right   . Knee surgery Left    Social History:   reports that she quit smoking about 53 years ago. Her  smoking use included Cigarettes. She has a 16 pack-year smoking history. She has never used smokeless tobacco. She reports that she does not drink alcohol or use illicit drugs.  Family History  Problem Relation Age of Onset  . Breast cancer Mother   . Alzheimer's disease Mother   . Heart disease Mother   . Ovarian cancer Paternal Grandmother   . Arthritis Other     Cousins   . COPD Brother   . COPD Cousin     Maternal side   . Heart attack Maternal Uncle   . Heart disease Sister   . Alcohol abuse Sister     Medications: Patient's Medications  New Prescriptions   No medications on file  Previous Medications   ACETAMINOPHEN (TYLENOL) 500 MG  TABLET    Take 500 mg by mouth every 6 (six) hours as needed (for headache as needed).    ADVAIR DISKUS 250-50 MCG/DOSE AEPB    INHALE 1 PUFF BY MOUTH TWICE DAILY. RINSE MOUTH AFTER USE   ALBUTEROL (PROVENTIL HFA;VENTOLIN HFA) 108 (90 BASE) MCG/ACT INHALER    Inhale 2 puffs into the lungs every 4 (four) hours as needed for wheezing or shortness of breath. RESCUE   ALENDRONATE (FOSAMAX) 70 MG TABLET    Take 1 tablet (70 mg total) by mouth every 7 (seven) days. Take with a full glass of water on an empty stomach.   AMLODIPINE (NORVASC) 5 MG TABLET    TAKE 1 TABLET BY MOUTH DAILY FOR BLOOD PRESSURE   ASPIRIN 81 MG EC TABLET    Take 81 mg by mouth daily.     CETIRIZINE HCL (ZYRTEC ALLERGY) 10 MG CAPS    Take 1 capsule by mouth daily.     CHOLECALCIFEROL (VITAMIN D3) 1000 UNITS CAPS    Take by mouth daily.     FLUTICASONE (FLONASE) 50 MCG/ACT NASAL SPRAY    1-2 SPRAYS EACH NOSTRIL TWICE DAILY   HYDROCHLOROTHIAZIDE (MICROZIDE) 12.5 MG CAPSULE    TAKE 1 CAPSULE BY MOUTH EVERY DAY   INJECTION DEVICE MISC    by Does not apply route. Allergy injection once every Monday, Administered by Dr.Young   MECLIZINE (ANTIVERT) 25 MG TABLET    TAKE 1 TABLET 3 TIMES A DAY AS NEEDED FOR DIZZINESS/NAUSEA   MULTIPLE VITAMINS-MINERALS (CENTRUM SILVER PO)    Take by mouth daily.     NONFORMULARY OR COMPOUNDED ITEM    Allergy Vaccine 1:10 Given at [facility]   OMEGA-3 FATTY ACIDS (FISH OIL MAXIMUM STRENGTH) 1200 MG CAPS    Take by mouth. 2 by mouth daily   PANTOPRAZOLE (PROTONIX) 40 MG TABLET    TAKE 1 TABLET BY MOUTH EVERY DAY   POTASSIUM CHLORIDE SA (K-DUR,KLOR-CON) 20 MEQ TABLET    TAKE 1 TABLET BY MOUTH DAILY   PROPYLENE GLYCOL (SYSTANE BALANCE) 0.6 % SOLN    Place 2 drops into both eyes 2 (two) times daily.     SALINE NASAL SPRAY NA    Place 88 mcg into the nose 2 (two) times daily.  Modified Medications   No medications on file  Discontinued Medications   DOXYCYCLINE (VIBRA-TABS) 100 MG TABLET    Take 1  tablet (100 mg total) by mouth 2 (two) times daily.   FAMOTIDINE (PEPCID) 20 MG TABLET    Take 20 mg by mouth 2 (two) times daily.     Physical Exam:  Filed Vitals:   10/23/15 1437  BP: 126/86  Pulse: 93  Temp: 97.9 F (36.6  C)  TempSrc: Oral  Resp: 18  Height: 5' 4"  (1.626 m)  Weight: 183 lb (83.008 kg)  SpO2: 99%   Body mass index is 31.4 kg/(m^2).  Physical Exam  Constitutional: She is oriented to person, place, and time. She appears well-developed and well-nourished. No distress.  HENT:  Head: Normocephalic and atraumatic.  Right Ear: External ear normal.  Left Ear: External ear normal.  Nose: Nose normal.  Mouth/Throat: Oropharynx is clear and moist. No oropharyngeal exudate.  Eyes: Conjunctivae and EOM are normal. Pupils are equal, round, and reactive to light.  Cardiovascular: Normal rate, regular rhythm and normal heart sounds.   Pulmonary/Chest: Effort normal and breath sounds normal.  Abdominal: Soft. Bowel sounds are normal. She exhibits no distension and no mass. There is no tenderness. There is no rebound and no guarding.  Genitourinary: There is erythema (with yeast) in the vagina.  Musculoskeletal: Normal range of motion. She exhibits tenderness (right mid back tenderness that goes around to right side). She exhibits no edema.  Neurological: She is alert and oriented to person, place, and time.  Skin: Skin is warm and dry. No rash noted. She is not diaphoretic. No erythema. No pallor.  Psychiatric: Her mood appears anxious.    Labs reviewed: Basic Metabolic Panel:  Recent Labs  12/27/14 1011 03/30/15 1032 08/08/15 1519  NA 142 142 140  K 4.1 3.8 3.6  CL 100 102 102  CO2 26 22  --   GLUCOSE 111* 105* 94  BUN 17 17 22*  CREATININE 0.70 0.75 0.70  CALCIUM 9.4 9.8  --    Liver Function Tests:  Recent Labs  03/30/15 1032  AST 28  ALT 21  ALKPHOS 91  BILITOT 0.5  PROT 6.7  ALBUMIN 4.5   No results for input(s): LIPASE, AMYLASE in the last  8760 hours. No results for input(s): AMMONIA in the last 8760 hours. CBC:  Recent Labs  03/30/15 1032 08/08/15 1519  WBC 6.2  --   NEUTROABS 2.9  --   HGB  --  15.0  HCT 42.0 44.0  MCV 84  --   PLT 385*  --    Lipid Panel:  Recent Labs  03/30/15 1032  CHOL 218*  HDL 53  LDLCALC 134*  TRIG 153*  CHOLHDL 4.1   TSH: No results for input(s): TSH in the last 8760 hours. A1C: Lab Results  Component Value Date   HGBA1C 5.6 12/29/2014     Assessment/Plan 1. Flank pain musculoskeletal vs kidney stones -may cont to use tylenol as needed -may use muscle rub as needed - POCT urinalysis dipstick- negative - US Renal r/o kidney stones -return precautions discussed   2. Candidiasis of vulva and vagina -to use monostat OTC for 3 days -discuss keeping area clean and dry -to follow up if no improvement with treatment  Keep follow up appt.    Carlos American. Harle Battiest  [facility] [phone] 8 am - 5 pm) [phone] (after hours)

## 2015-10-23 NOTE — Patient Instructions (Signed)
Right sided back pain may be due to kidney stones or musculoskeletal May cont tylenol for now May use muscle rub like icyhot/benegay/biofreeze   To use monostat 3 day OTC cream for vaginal yeast  Monilial Vaginitis Vaginitis in a soreness, swelling and redness (inflammation) of the vagina and vulva. Monilial vaginitis is not a sexually transmitted infection. CAUSES  Yeast vaginitis is caused by yeast (candida) that is normally found in your vagina. With a yeast infection, the candida has overgrown in number to a point that upsets the chemical balance. SYMPTOMS   White, thick vaginal discharge.  Swelling, itching, redness and irritation of the vagina and possibly the lips of the vagina (vulva).  Burning or painful urination.  Painful intercourse. DIAGNOSIS  Things that may contribute to monilial vaginitis are:  Postmenopausal and virginal states.  Pregnancy.  Infections.  Being tired, sick or stressed, especially if you had monilial vaginitis in the past.  Diabetes. Good control will help lower the chance.  Birth control pills.  Tight fitting garments.  Using bubble bath, feminine sprays, douches or deodorant tampons.  Taking certain medications that kill germs (antibiotics).  Sporadic recurrence can occur if you become ill. TREATMENT  Your caregiver will give you medication.  There are several kinds of anti monilial vaginal creams and suppositories specific for monilial vaginitis. For recurrent yeast infections, use a suppository or cream in the vagina 2 times a week, or as directed.  Anti-monilial or steroid cream for the itching or irritation of the vulva may also be used. Get your caregiver's permission.  Painting the vagina with methylene blue solution may help if the monilial cream does not work.  Eating yogurt may help prevent monilial vaginitis. HOME CARE INSTRUCTIONS   Finish all medication as prescribed.  Do not have sex until treatment is completed or  after your caregiver tells you it is okay.  Take warm sitz baths.  Do not douche.  Do not use tampons, especially scented ones.  Wear cotton underwear.  Avoid tight pants and panty hose.  Tell your sexual partner that you have a yeast infection. They should go to their caregiver if they have symptoms such as mild rash or itching.  Your sexual partner should be treated as well if your infection is difficult to eliminate.  Practice safer sex. Use condoms.  Some vaginal medications cause latex condoms to fail. Vaginal medications that harm condoms are:  Cleocin cream.  Butoconazole (Femstat).  Terconazole (Terazol) vaginal suppository.  Miconazole (Monistat) (may be purchased over the counter). SEEK MEDICAL CARE IF:   You have a temperature by mouth above 102 F (38.9 C).  The infection is getting worse after 2 days of treatment.  The infection is not getting better after 3 days of treatment.  You develop blisters in or around your vagina.  You develop vaginal bleeding, and it is not your menstrual period.  You have pain when you urinate.  You develop intestinal problems.  You have pain with sexual intercourse.   This information is not intended to replace advice given to you by your health care provider. Make sure you discuss any questions you have with your health care provider.   Document Released: 02/06/2005 Document Revised: 07/22/2011 Document Reviewed: 10/31/2014 Elsevier Interactive Patient Education [employer].

## 2015-10-27 ENCOUNTER — Other Ambulatory Visit: Payer: Self-pay | Admitting: Nurse Practitioner

## 2015-10-27 ENCOUNTER — Ambulatory Visit
Admission: RE | Admit: 2015-10-27 | Discharge: 2015-10-27 | Disposition: A | Discharge status: Home / Self Care | Payer: Medicare Other | Source: Ambulatory Visit | Attending: Nurse Practitioner | Admitting: Nurse Practitioner

## 2015-10-27 DIAGNOSIS — N281 Cyst of kidney, acquired: Secondary | ICD-10-CM | POA: Diagnosis not present

## 2015-10-30 ENCOUNTER — Ambulatory Visit (INDEPENDENT_AMBULATORY_CARE_PROVIDER_SITE_OTHER): Payer: Medicare Other

## 2015-10-30 DIAGNOSIS — J309 Allergic rhinitis, unspecified: Secondary | ICD-10-CM | POA: Diagnosis not present

## 2015-10-31 ENCOUNTER — Ambulatory Visit: Payer: Medicare Other | Admitting: Nurse Practitioner

## 2015-11-06 ENCOUNTER — Ambulatory Visit (INDEPENDENT_AMBULATORY_CARE_PROVIDER_SITE_OTHER): Payer: Medicare Other | Admitting: *Deleted

## 2015-11-06 DIAGNOSIS — J309 Allergic rhinitis, unspecified: Secondary | ICD-10-CM | POA: Diagnosis not present

## 2015-11-07 ENCOUNTER — Ambulatory Visit (INDEPENDENT_AMBULATORY_CARE_PROVIDER_SITE_OTHER): Payer: Medicare Other | Admitting: Nurse Practitioner

## 2015-11-07 ENCOUNTER — Encounter: Payer: Self-pay | Admitting: Nurse Practitioner

## 2015-11-07 VITALS — BP 126/74 | HR 91 | Temp 97.9°F | Resp 18 | Ht 64.0 in | Wt 183.2 lb

## 2015-11-07 DIAGNOSIS — K219 Gastro-esophageal reflux disease without esophagitis: Secondary | ICD-10-CM

## 2015-11-07 DIAGNOSIS — M8580 Other specified disorders of bone density and structure, unspecified site: Secondary | ICD-10-CM | POA: Diagnosis not present

## 2015-11-07 DIAGNOSIS — I10 Essential (primary) hypertension: Secondary | ICD-10-CM | POA: Diagnosis not present

## 2015-11-07 DIAGNOSIS — J4520 Mild intermittent asthma, uncomplicated: Secondary | ICD-10-CM | POA: Diagnosis not present

## 2015-11-07 DIAGNOSIS — E785 Hyperlipidemia, unspecified: Secondary | ICD-10-CM | POA: Diagnosis not present

## 2015-11-07 NOTE — Patient Instructions (Addendum)
Please schedule for fasting blood work later this week  Increase activity- 30 mins of cardiovascular activity 5 days a week  Watch your calorie intake   Follow up in 3 months

## 2015-11-07 NOTE — Progress Notes (Signed)
Patient ID: [name], female   DOB: 03/18/1941, 75 y.o.   MRN: [id number]    PCP: Lauree Chandler, NP  Advanced Directive information Does patient have an advance directive?: No  Allergies  Allergen Reactions  . Ace Inhibitors   . Amoxicillin     REACTION: unknown? pain in right kidney  . Benicar Hct [Olmesartan Medoxomil-Hctz]     Extreme weakness  . Budesonide-Formoterol Fumarate     Causes tremors and numbness  . Chlorzoxazone [Chlorzoxazone]   . Citalopram Hydrobromide     REACTION: hives  . Citalopram Hydrobromide   . Clonidine Derivatives   . Droperidol     REACTION: hives  . Flexeril [Cyclobenzaprine Hcl]   . Ketorolac Tromethamine     REACTION: hives  . Ketorolac Tromethamine   . Metoclopramide Hcl   . Mometasone Furo-Formoterol Fum     Causes sore throat, blurred vision and unable to sleep--started on med 09-17-10  . Morphine     REACTION: hives and itching  . Olmesartan Medoxomil     REACTION: fatique    Chief Complaint  Patient presents with  . Medical Management of Chronic Issues    6 month follow-up  . OTHER    discuss renal US results (printed) and medications (Fosamax & nexium)     HPI: Patient is a 75 y.o. female seen in the office for routine follow up. Pt with hx of htn, sinusitis, IBS, GERD, OA, hyperlipidemia, anxiety.  Pt was seen 2 weeks ago due to right sided pain, [place] of kidneys without acute findings. Still having pain but now contributes to lack of activity- before she was exercising.  Tylenol and heating pad helps pain when she needs it  No fever or chills.  Having worsening GERD due to not eating right and bending over. Taking OTC nexium when she can afford it because it works better than the protonix Had vaginal yeast at last appt. Took OTC monostat which helped. No ongoing symptoms.   Feels like she gives into food a lot, can no control cravings. Feels as if she is in a rut. Not exercising currently   Discussed LDL, pt reports  does not tolerate cholesterol medications and does not wish to be on medication  On allergy shots weekly at the pulmonary office, less asthma but reports she conts to sneeze and cough.  Review of Systems:  Review of Systems  Constitutional: Negative for chills, activity change, appetite change, fatigue and unexpected weight change.  Eyes: Negative.   Respiratory: Negative for cough and shortness of breath.   Cardiovascular: Negative for chest pain, palpitations and leg swelling.  Gastrointestinal: Negative for abdominal pain, diarrhea and constipation.  Genitourinary: Positive for flank pain, vaginal pain and dyspareunia. Negative for dysuria, urgency, frequency, hematuria, decreased urine volume, vaginal bleeding, vaginal discharge, difficulty urinating and pelvic pain.  Musculoskeletal: Positive for myalgias.  Skin: Negative for rash and wound.  Neurological: Negative for dizziness and weakness.    Past Medical History  Diagnosis Date  . ALLERGIC RHINITIS   . Hypertension   . Asthma   . Hypercholesterolemia     borderline  . GERD (gastroesophageal reflux disease)   . Diverticulosis of colon   . IBS (irritable bowel syndrome)   . Colon polyp   . History of nephrolithiasis   . Osteoarthritis   . Fibromyalgia   . Vitamin D deficiency   . Postconcussion syndrome   . Cervical strain, acute   . Dizziness   . Anxiety   .  Personal history of allergy to unspecified medicinal agent   . Blood type O+    Past Surgical History  Procedure Laterality Date  . Cholecystectomy, laparoscopic  2004    for gallstone pancreatitis  . Thyroid surgery    . Knee surgery Right   . Knee surgery Left    Social History:   reports that she quit smoking about 53 years ago. Her smoking use included Cigarettes. She has a 16 pack-year smoking history. She has never used smokeless tobacco. She reports that she does not drink alcohol or use illicit drugs.  Family History  Problem Relation Age of Onset   . Breast cancer Mother   . Alzheimer's disease Mother   . Heart disease Mother   . Ovarian cancer Paternal Grandmother   . Arthritis Other     Cousins   . COPD Brother   . COPD Cousin     Maternal side   . Heart attack Maternal Uncle   . Heart disease Sister   . Alcohol abuse Sister     Medications: Patient's Medications  New Prescriptions   No medications on file  Previous Medications   ACETAMINOPHEN (TYLENOL) 500 MG TABLET    Take 500 mg by mouth every 6 (six) hours as needed (for headache as needed).    ADVAIR DISKUS 250-50 MCG/DOSE AEPB    INHALE 1 PUFF BY MOUTH TWICE DAILY. RINSE MOUTH AFTER USE   ALBUTEROL (PROVENTIL HFA;VENTOLIN HFA) 108 (90 BASE) MCG/ACT INHALER    Inhale 2 puffs into the lungs every 4 (four) hours as needed for wheezing or shortness of breath. RESCUE   AMLODIPINE (NORVASC) 5 MG TABLET    TAKE 1 TABLET BY MOUTH DAILY FOR BLOOD PRESSURE   ASPIRIN 81 MG EC TABLET    Take 81 mg by mouth daily.     CETIRIZINE HCL (ZYRTEC ALLERGY) 10 MG CAPS    Take 1 capsule by mouth daily.     CHOLECALCIFEROL (VITAMIN D3) 1000 UNITS CAPS    Take by mouth daily.     FLUTICASONE (FLONASE) 50 MCG/ACT NASAL SPRAY    1-2 SPRAYS EACH NOSTRIL TWICE DAILY   HYDROCHLOROTHIAZIDE (MICROZIDE) 12.5 MG CAPSULE    TAKE 1 CAPSULE BY MOUTH EVERY DAY   INJECTION DEVICE MISC    by Does not apply route. Allergy injection once every Monday, Administered by Dr.Young   MECLIZINE (ANTIVERT) 25 MG TABLET    TAKE 1 TABLET 3 TIMES A DAY AS NEEDED FOR DIZZINESS/NAUSEA   MULTIPLE VITAMINS-MINERALS (CENTRUM SILVER PO)    Take by mouth daily.     NONFORMULARY OR COMPOUNDED ITEM    Allergy Vaccine 1:10 Given at [employer]   OMEGA-3 FATTY ACIDS (FISH OIL MAXIMUM STRENGTH) 1200 MG CAPS    Take by mouth. 2 by mouth daily   PANTOPRAZOLE (PROTONIX) 40 MG TABLET    TAKE 1 TABLET BY MOUTH EVERY DAY   POTASSIUM CHLORIDE SA (K-DUR,KLOR-CON) 20 MEQ TABLET    TAKE 1 TABLET BY MOUTH DAILY   PROPYLENE GLYCOL  (SYSTANE BALANCE) 0.6 % SOLN    Place 2 drops into both eyes 2 (two) times daily.     SALINE NASAL SPRAY NA    Place 88 mcg into the nose 2 (two) times daily.  Modified Medications   No medications on file  Discontinued Medications   ALENDRONATE (FOSAMAX) 70 MG TABLET    Take 1 tablet (70 mg total) by mouth every 7 (seven) days. Take with a full glass of  water on an empty stomach.     Physical Exam:  Filed Vitals:   11/07/15 1307  BP: 126/74  Pulse: 91  Temp: 97.9 F (36.6 C)  TempSrc: Oral  Resp: 18  Height: 5' 4"  (1.626 m)  Weight: 183 lb 3.2 oz (83.099 kg)  SpO2: 96%   Body mass index is 31.43 kg/(m^2).  Physical Exam  Constitutional: She is oriented to person, place, and time. She appears well-developed and well-nourished. No distress.  HENT:  Head: Normocephalic and atraumatic.  Right Ear: External ear normal.  Left Ear: External ear normal.  Nose: Nose normal.  Mouth/Throat: Oropharynx is clear and moist. No oropharyngeal exudate.  Eyes: Conjunctivae and EOM are normal. Pupils are equal, round, and reactive to light.  Cardiovascular: Normal rate, regular rhythm and normal heart sounds.   Pulmonary/Chest: Effort normal and breath sounds normal.  Abdominal: Soft. Bowel sounds are normal. She exhibits no distension. There is no tenderness.  Musculoskeletal: Normal range of motion. She exhibits no edema or tenderness.  Neurological: She is alert and oriented to person, place, and time.  Skin: Skin is warm and dry. No rash noted. She is not diaphoretic. No erythema. No pallor.  Psychiatric: Her mood appears anxious.    Labs reviewed: Basic Metabolic Panel:  Recent Labs  12/27/14 1011 03/30/15 1032 08/08/15 1519  NA 142 142 140  K 4.1 3.8 3.6  CL 100 102 102  CO2 26 22  --   GLUCOSE 111* 105* 94  BUN 17 17 22*  CREATININE 0.70 0.75 0.70  CALCIUM 9.4 9.8  --    Liver Function Tests:  Recent Labs  03/30/15 1032  AST 28  ALT 21  ALKPHOS 91  BILITOT 0.5   PROT 6.7  ALBUMIN 4.5   No results for input(s): LIPASE, AMYLASE in the last 8760 hours. No results for input(s): AMMONIA in the last 8760 hours. CBC:  Recent Labs  03/30/15 1032 08/08/15 1519  WBC 6.2  --   NEUTROABS 2.9  --   HGB  --  15.0  HCT 42.0 44.0  MCV 84  --   PLT 385*  --    Lipid Panel:  Recent Labs  03/30/15 1032  CHOL 218*  HDL 53  LDLCALC 134*  TRIG 153*  CHOLHDL 4.1   TSH: No results for input(s): TSH in the last 8760 hours. A1C: Lab Results  Component Value Date   HGBA1C 5.6 12/29/2014     Assessment/Plan 1. Hyperlipidemia -pt does not wish to start medication for cholesterol if elevated, discussed need for diet modification and to increase activity  - Comprehensive metabolic panel; Future - Lipid panel; Future  2. Essential hypertension Blood pressure stable, conts on norvasc  3. Gastroesophageal reflux disease, esophagitis presence not specified Worsening GERD with poor food choices, encouraged dietary compliance.  Pt reports she takes nexium when she can afford it but when she cant she switches to protonix  4. Asthma, allergic, mild intermittent, uncomplicated -remains stable, no recent exacerbations   5. Osteopenia -does not wish to be on fosamax, will cont calcium with vit D  Will get fasting blood work later this week, to follow up in 3 months sooner if needed    Sierra Spargo K. Harle Battiest  [facility] [phone] 8 am - 5 pm) [phone] (after hours)

## 2015-11-10 ENCOUNTER — Other Ambulatory Visit: Payer: Medicare Other

## 2015-11-10 DIAGNOSIS — E785 Hyperlipidemia, unspecified: Secondary | ICD-10-CM

## 2015-11-11 LAB — COMPREHENSIVE METABOLIC PANEL
A/G RATIO: 1.8 (ref 1.2–2.2)
ALK PHOS: 90 IU/L (ref 39–117)
ALT: 24 IU/L (ref 0–32)
AST: 28 IU/L (ref 0–40)
Albumin: 4.5 g/dL (ref 3.5–4.8)
BILIRUBIN TOTAL: 0.6 mg/dL (ref 0.0–1.2)
BUN/Creatinine Ratio: 27 (ref 12–28)
BUN: 20 mg/dL (ref 8–27)
CALCIUM: 9.6 mg/dL (ref 8.7–10.3)
CHLORIDE: 98 mmol/L (ref 96–106)
CO2: 25 mmol/L (ref 18–29)
Creatinine, Ser: 0.74 mg/dL (ref 0.57–1.00)
GFR calc Af Amer: 90 mL/min/{1.73_m2} (ref >59)
GFR, EST NON AFRICAN AMERICAN: 78 mL/min/{1.73_m2} (ref >59)
Globulin, Total: 2.5 g/dL (ref 1.5–4.5)
Glucose: 126 mg/dL — ABNORMAL HIGH (ref 65–99)
POTASSIUM: 4 mmol/L (ref 3.5–5.2)
Sodium: 139 mmol/L (ref 134–144)
Total Protein: 7 g/dL (ref 6.0–8.5)

## 2015-11-11 LAB — LIPID PANEL
CHOLESTEROL TOTAL: 217 mg/dL — AB (ref 100–199)
Chol/HDL Ratio: 4.9 ratio units — ABNORMAL HIGH (ref 0.0–4.4)
HDL: 44 mg/dL (ref >39)
LDL Calculated: 133 mg/dL — ABNORMAL HIGH (ref 0–99)
TRIGLYCERIDES: 200 mg/dL — AB (ref 0–149)
VLDL Cholesterol Cal: 40 mg/dL (ref 5–40)

## 2015-11-13 ENCOUNTER — Telehealth: Payer: Self-pay

## 2015-11-13 ENCOUNTER — Ambulatory Visit (INDEPENDENT_AMBULATORY_CARE_PROVIDER_SITE_OTHER): Payer: Medicare Other | Admitting: *Deleted

## 2015-11-13 DIAGNOSIS — J309 Allergic rhinitis, unspecified: Secondary | ICD-10-CM

## 2015-11-13 DIAGNOSIS — R739 Hyperglycemia, unspecified: Secondary | ICD-10-CM | POA: Insufficient documentation

## 2015-11-13 NOTE — Telephone Encounter (Signed)
I called patient to set up a lab appointment so that a hemoglobin A1c can be drawn.    I was unable to reach the patient, phone just kept ringing and no machine picked up.

## 2015-11-16 NOTE — Telephone Encounter (Signed)
I spoke with patient and scheduled an appointment for A1C on Thursday 11/23/15 at 8:45

## 2015-11-20 ENCOUNTER — Other Ambulatory Visit: Payer: Self-pay | Admitting: Nurse Practitioner

## 2015-11-20 ENCOUNTER — Telehealth: Payer: Self-pay

## 2015-11-20 ENCOUNTER — Other Ambulatory Visit: Payer: Self-pay

## 2015-11-20 DIAGNOSIS — R739 Hyperglycemia, unspecified: Secondary | ICD-10-CM

## 2015-11-20 NOTE — Telephone Encounter (Signed)
Pt had appt at 11:30 today for allergy vaccine- pt states that she's had a warm red reaction at injection site Xseveral weeks and wishes to d/c injections until September.  Pt cancelled appt today, stating that she did not wish to receive vaccine today.    Forwarding to CY as FYI.

## 2015-11-20 NOTE — Telephone Encounter (Signed)
Ok. Please document with allergy record.

## 2015-11-23 ENCOUNTER — Other Ambulatory Visit: Payer: Medicare Other

## 2015-11-23 DIAGNOSIS — R739 Hyperglycemia, unspecified: Secondary | ICD-10-CM

## 2015-11-23 LAB — HEMOGLOBIN A1C
HEMOGLOBIN A1C: 5.6 % (ref <5.7)
MEAN PLASMA GLUCOSE: 114 mg/dL

## 2015-12-19 ENCOUNTER — Other Ambulatory Visit: Payer: Self-pay | Admitting: Internal Medicine

## 2015-12-19 ENCOUNTER — Other Ambulatory Visit: Payer: Self-pay | Admitting: Nurse Practitioner

## 2016-01-16 ENCOUNTER — Other Ambulatory Visit: Payer: Self-pay | Admitting: Nurse Practitioner

## 2016-01-16 ENCOUNTER — Other Ambulatory Visit: Payer: Self-pay | Admitting: Internal Medicine

## 2016-01-16 DIAGNOSIS — Z1231 Encounter for screening mammogram for malignant neoplasm of breast: Secondary | ICD-10-CM

## 2016-01-22 ENCOUNTER — Ambulatory Visit (INDEPENDENT_AMBULATORY_CARE_PROVIDER_SITE_OTHER): Payer: Medicare Other | Admitting: *Deleted

## 2016-01-22 DIAGNOSIS — J309 Allergic rhinitis, unspecified: Secondary | ICD-10-CM | POA: Diagnosis not present

## 2016-01-29 ENCOUNTER — Ambulatory Visit: Payer: Medicare Other

## 2016-02-01 ENCOUNTER — Ambulatory Visit
Admission: RE | Admit: 2016-02-01 | Discharge: 2016-02-01 | Disposition: A | Discharge status: Home / Self Care | Payer: Medicare Other | Source: Ambulatory Visit | Attending: Nurse Practitioner | Admitting: Nurse Practitioner

## 2016-02-01 DIAGNOSIS — Z1231 Encounter for screening mammogram for malignant neoplasm of breast: Secondary | ICD-10-CM | POA: Diagnosis not present

## 2016-02-05 ENCOUNTER — Ambulatory Visit (INDEPENDENT_AMBULATORY_CARE_PROVIDER_SITE_OTHER): Payer: Medicare Other | Admitting: *Deleted

## 2016-02-05 DIAGNOSIS — J309 Allergic rhinitis, unspecified: Secondary | ICD-10-CM

## 2016-02-08 ENCOUNTER — Ambulatory Visit: Payer: Medicare Other | Admitting: Nurse Practitioner

## 2016-02-12 ENCOUNTER — Telehealth: Payer: Self-pay | Admitting: Internal Medicine

## 2016-02-12 MED ORDER — PREDNISONE 10 MG (21) PO TBPK: 10.0000 mg | ORAL_TABLET | Freq: Every day | ORAL | 0 refills | Status: DC

## 2016-02-12 NOTE — Telephone Encounter (Signed)
Spoke with pt. States that her allergies are flaring up. Reports wheezing, coughing, sinus congestion. Cough is producing clear, thick mucus. Denies chest tightness, SOB, fever. She is currently back on her allergy shots but feels they are helping. Would like CY's recommendations.  Allergies  Allergen Reactions  . Ace Inhibitors   . Amoxicillin     REACTION: unknown? pain in right kidney  . Benicar Hct [Olmesartan Medoxomil-Hctz]     Extreme weakness  . Budesonide-Formoterol Fumarate     Causes tremors and numbness  . Chlorzoxazone [Chlorzoxazone]   . Citalopram Hydrobromide     REACTION: hives  . Citalopram Hydrobromide   . Clonidine Derivatives   . Droperidol     REACTION: hives  . Flexeril [Cyclobenzaprine Hcl]   . Ketorolac Tromethamine     REACTION: hives  . Ketorolac Tromethamine   . Metoclopramide Hcl   . Mometasone Furo-Formoterol Fum     Causes sore throat, blurred vision and unable to sleep--started on med 09-17-10  . Morphine     REACTION: hives and itching  . Olmesartan Medoxomil     REACTION: fatique   Current Outpatient Prescriptions on File Prior to Visit  Medication Sig Dispense Refill  . acetaminophen (TYLENOL) 500 MG tablet Take 500 mg by mouth every 6 (six) hours as needed (for headache as needed).     . ADVAIR DISKUS 250-50 MCG/DOSE AEPB INHALE 1 PUFF BY MOUTH TWICE DAILY. RINSE MOUTH AFTER USE 60 each 12  . albuterol (PROVENTIL HFA;VENTOLIN HFA) 108 (90 BASE) MCG/ACT inhaler Inhale 2 puffs into the lungs every 4 (four) hours as needed for wheezing or shortness of breath. RESCUE 1 Inhaler prn  . amLODipine (NORVASC) 5 MG tablet TAKE 1 TABLET BY MOUTH DAILY FOR BLOOD PRESSURE 30 tablet 6  . aspirin 81 MG EC tablet Take 81 mg by mouth daily.      . Cetirizine HCl (ZYRTEC ALLERGY) 10 MG CAPS Take 1 capsule by mouth daily.      . Cholecalciferol (VITAMIN D3) 1000 UNITS CAPS Take by mouth daily.      . fluticasone (FLONASE) 50 MCG/ACT nasal spray SHAKE LIQUID  AND USE 1 TO 2 SPRAYS IN EACH NOSTRIL TWICE DAILY 16 g 6  . hydrochlorothiazide (MICROZIDE) 12.5 MG capsule TAKE 1 CAPSULE BY MOUTH EVERY DAY 30 capsule 6  . Injection Device MISC by Does not apply route. Allergy injection once every Monday, Administered by Dr.Young    . meclizine (ANTIVERT) 25 MG tablet TAKE 1 TABLET 3 TIMES A DAY AS NEEDED FOR DIZZINESS/NAUSEA 30 tablet 1  . Multiple Vitamins-Minerals (CENTRUM SILVER PO) Take by mouth daily.      . NONFORMULARY OR COMPOUNDED ITEM Allergy Vaccine 1:10 Given at [facility]    . Omega-3 Fatty Acids (FISH OIL MAXIMUM STRENGTH) 1200 MG CAPS Take by mouth. 2 by mouth daily    . pantoprazole (PROTONIX) 40 MG tablet TAKE 1 TABLET BY MOUTH EVERY DAY 30 tablet 2  . potassium chloride SA (K-DUR,KLOR-CON) 20 MEQ tablet TAKE 1 TABLET BY MOUTH DAILY 30 tablet 6  . Propylene Glycol (SYSTANE BALANCE) 0.6 % SOLN Place 2 drops into both eyes 2 (two) times daily.      Marland Kitchen SALINE NASAL SPRAY NA Place 88 mcg into the nose 2 (two) times daily.     No current facility-administered medications on file prior to visit.     CY - please advise. Thanks.

## 2016-02-12 NOTE — Telephone Encounter (Signed)
Offer prednisone 10 mg, # 7- 1 daily, then stop

## 2016-02-12 NOTE — Telephone Encounter (Signed)
Spoke with the pt and notified of recs per CDY  Pt verbalized understanding and nothing further needed  Rx was sent to pharm

## 2016-02-13 ENCOUNTER — Encounter: Payer: Self-pay | Admitting: Nurse Practitioner

## 2016-02-13 ENCOUNTER — Ambulatory Visit (INDEPENDENT_AMBULATORY_CARE_PROVIDER_SITE_OTHER): Payer: Medicare Other | Admitting: Nurse Practitioner

## 2016-02-13 VITALS — BP 132/84 | HR 74 | Temp 98.1°F | Resp 18 | Ht 64.0 in | Wt 190.2 lb

## 2016-02-13 DIAGNOSIS — J45909 Unspecified asthma, uncomplicated: Secondary | ICD-10-CM

## 2016-02-13 MED ORDER — ALBUTEROL SULFATE HFA 108 (90 BASE) MCG/ACT IN AERS: 2.0000 | INHALATION_SPRAY | RESPIRATORY_TRACT | 99 refills | Status: DC | PRN

## 2016-02-13 NOTE — Progress Notes (Signed)
Careteam: Patient Care Team: Lauree Chandler, NP as PCP - General (Nurse Practitioner) Lauree Chandler, NP as Nurse Practitioner (Nurse Practitioner) Lauree Chandler, NP as Nurse Practitioner (Nurse Practitioner)  Advanced Directive information Does patient have an advance directive?: No, Would patient like information on creating an advanced directive?: Yes - Educational materials given  Allergies  Allergen Reactions  . Ace Inhibitors   . Amoxicillin     REACTION: unknown? pain in right kidney  . Benicar Hct [Olmesartan Medoxomil-Hctz]     Extreme weakness  . Budesonide-Formoterol Fumarate     Causes tremors and numbness  . Chlorzoxazone [Chlorzoxazone]   . Citalopram Hydrobromide     REACTION: hives  . Citalopram Hydrobromide   . Clonidine Derivatives   . Droperidol     REACTION: hives  . Flexeril [Cyclobenzaprine Hcl]   . Ketorolac Tromethamine     REACTION: hives  . Ketorolac Tromethamine   . Metoclopramide Hcl   . Mometasone Furo-Formoterol Fum     Causes sore throat, blurred vision and unable to sleep--started on med 09-17-10  . Morphine     REACTION: hives and itching  . Olmesartan Medoxomil     REACTION: fatique    Chief Complaint  Patient presents with  . Acute Visit    Cough with thick mucus(clear), chest wheeze, throat irritation, mild headache.     HPI: Patient is a 75 y.o. female seen in the office today for cough, that began yesterday at 0300.  She has had 2-3 other recent episodes that resolved themselves. Fall is the worse time. Following with allergist but unable to get in until February. Now Cough waking her from sleep.  Productive cough, thick white phlem.  Patient reports shortness of breath after coughing episodes.  Patient has used saline nasal spray, tylenol, advair, and flonase.  Endorses mild chills and fatigue.  Denies temperature.  Mild right ear discomfort, denies any dizziness or ringing.  Mild throat discomfort, gargling warm salt  water.  Denies chest pain, palpatations  Saw Dr [name] for pulmonary issues, Dr Annamaria Boots for allergies, Clemetine Marker NP for follow up.  Pt with hx of seasonal allergies, asthma, htn, OA, fibromyalgia.  Has started a prednisone taper which was started by Dr Annamaria Boots.   Review of Systems:  Review of Systems  Constitutional: Positive for chills and fatigue. Negative for activity change, appetite change, fever and unexpected weight change.  HENT: Positive for congestion, ear pain, postnasal drip, rhinorrhea and sore throat. Negative for ear discharge, facial swelling, hearing loss and tinnitus.   Eyes: Positive for itching.  Respiratory: Positive for cough, shortness of breath (when she talks too much) and wheezing. Negative for chest tightness.   Cardiovascular: Negative for chest pain, palpitations and leg swelling.  Gastrointestinal: Negative for abdominal pain, constipation and diarrhea.  Endocrine: Negative for cold intolerance and heat intolerance.  Musculoskeletal: Negative for myalgias.  Skin: Negative for rash and wound.  Neurological: Negative for dizziness and light-headedness.      Past Medical History:  Diagnosis Date  . ALLERGIC RHINITIS   . Anxiety   . Asthma   . Blood type O+   . Cervical strain, acute   . Colon polyp   . Diverticulosis of colon   . Dizziness   . Fibromyalgia   . GERD (gastroesophageal reflux disease)   . History of nephrolithiasis   . Hypercholesterolemia    borderline  . Hypertension   . IBS (irritable bowel syndrome)   . Osteoarthritis   .  Personal history of allergy to unspecified medicinal agent   . Postconcussion syndrome   . Vitamin D deficiency    Past Surgical History:  Procedure Laterality Date  . CHOLECYSTECTOMY, LAPAROSCOPIC  2004   for gallstone pancreatitis  . KNEE SURGERY Right   . KNEE SURGERY Left   . THYROID SURGERY     Social History:   reports that she quit smoking about 53 years ago. Her smoking use included Cigarettes. She  has a 16.00 pack-year smoking history. She has never used smokeless tobacco. She reports that she does not drink alcohol or use drugs.  Family History  Problem Relation Age of Onset  . Breast cancer Mother   . Alzheimer's disease Mother   . Heart disease Mother   . COPD Brother   . Heart disease Sister   . Alcohol abuse Sister   . Ovarian cancer Paternal Grandmother   . Arthritis Other     Cousins   . COPD Cousin     Maternal side   . Heart attack Maternal Uncle     Medications: Patient's Medications  New Prescriptions   No medications on file  Previous Medications   ACETAMINOPHEN (TYLENOL) 500 MG TABLET    Take 500 mg by mouth every 6 (six) hours as needed (for headache as needed).    ADVAIR DISKUS 250-50 MCG/DOSE AEPB    INHALE 1 PUFF BY MOUTH TWICE DAILY. RINSE MOUTH AFTER USE   ALBUTEROL (PROVENTIL HFA;VENTOLIN HFA) 108 (90 BASE) MCG/ACT INHALER    Inhale 2 puffs into the lungs every 4 (four) hours as needed for wheezing or shortness of breath. RESCUE   AMLODIPINE (NORVASC) 5 MG TABLET    TAKE 1 TABLET BY MOUTH DAILY FOR BLOOD PRESSURE   ASPIRIN 81 MG EC TABLET    Take 81 mg by mouth daily.     CETIRIZINE HCL (ZYRTEC ALLERGY) 10 MG CAPS    Take 1 capsule by mouth daily.     CHOLECALCIFEROL (VITAMIN D3) 1000 UNITS CAPS    Take by mouth daily.     FLUTICASONE (FLONASE) 50 MCG/ACT NASAL SPRAY    SHAKE LIQUID AND USE 1 TO 2 SPRAYS IN EACH NOSTRIL TWICE DAILY   HYDROCHLOROTHIAZIDE (MICROZIDE) 12.5 MG CAPSULE    TAKE 1 CAPSULE BY MOUTH EVERY DAY   INJECTION DEVICE MISC    by Does not apply route. Allergy injection once every Monday, Administered by Dr.Young   MECLIZINE (ANTIVERT) 25 MG TABLET    TAKE 1 TABLET 3 TIMES A DAY AS NEEDED FOR DIZZINESS/NAUSEA   MULTIPLE VITAMINS-MINERALS (CENTRUM SILVER PO)    Take by mouth daily.     NONFORMULARY OR COMPOUNDED ITEM    Allergy Vaccine 1:10 Given at [facility]   OMEGA-3 FATTY ACIDS (FISH OIL MAXIMUM STRENGTH) 1200 MG CAPS    Take  by mouth. 2 by mouth daily   PANTOPRAZOLE (PROTONIX) 40 MG TABLET    TAKE 1 TABLET BY MOUTH EVERY DAY   POTASSIUM CHLORIDE SA (K-DUR,KLOR-CON) 20 MEQ TABLET    TAKE 1 TABLET BY MOUTH DAILY   PREDNISONE (DELTASONE) 10 MG TABLET    Take 1 tablet by mouth daily.   PROPYLENE GLYCOL (SYSTANE BALANCE) 0.6 % SOLN    Place 2 drops into both eyes 2 (two) times daily.     SALINE NASAL SPRAY NA    Place 88 mcg into the nose 2 (two) times daily.  Modified Medications   No medications on file  Discontinued Medications  PREDNISONE (STERAPRED UNI-PAK 21 TAB) 10 MG (21) TBPK TABLET    Take 1 tablet (10 mg total) by mouth daily.     Physical Exam:  Vitals:   02/13/16 1424  BP: 132/84  Pulse: 74  Resp: 18  Temp: 98.1 F (36.7 C)  TempSrc: Oral  Weight: 190 lb 3.2 oz (86.3 kg)  Height: 5' 4"  (1.626 m)   Body mass index is 32.65 kg/m.  Physical Exam  Constitutional: She is oriented to person, place, and time. She appears well-developed and well-nourished. No distress.  HENT:  Head: Normocephalic and atraumatic.  Right Ear: External ear normal.  Left Ear: External ear normal.  Nose: Nose normal.  Mouth/Throat: Oropharynx is clear and moist. No oropharyngeal exudate.  Eyes: Conjunctivae and EOM are normal. Pupils are equal, round, and reactive to light.  Cardiovascular: Normal rate, regular rhythm and normal heart sounds.   Pulmonary/Chest: Effort normal. She has wheezes in the right upper field and the left upper field.  Abdominal: Soft. Bowel sounds are normal. She exhibits no distension. There is no tenderness.  Musculoskeletal: Normal range of motion. She exhibits no edema or tenderness.  Neurological: She is alert and oriented to person, place, and time.  Skin: Skin is warm and dry. No rash noted. She is not diaphoretic. No erythema. No pallor.  Psychiatric: Her mood appears anxious.      Labs reviewed: Basic Metabolic Panel:  Recent Labs  03/30/15 1032 08/08/15 1519  11/10/15 0903  NA 142 140 139  K 3.8 3.6 4.0  CL 102 102 98  CO2 22  --  25  GLUCOSE 105* 94 126*  BUN 17 22* 20  CREATININE 0.75 0.70 0.74  CALCIUM 9.8  --  9.6   Liver Function Tests:  Recent Labs  03/30/15 1032 11/10/15 0903  AST 28 28  ALT 21 24  ALKPHOS 91 90  BILITOT 0.5 0.6  PROT 6.7 7.0  ALBUMIN 4.5 4.5   No results for input(s): LIPASE, AMYLASE in the last 8760 hours. No results for input(s): AMMONIA in the last 8760 hours. CBC:  Recent Labs  03/30/15 1032 08/08/15 1519  WBC 6.2  --   NEUTROABS 2.9  --   HGB  --  15.0  HCT 42.0 44.0  MCV 84  --   PLT 385*  --    Lipid Panel:  Recent Labs  03/30/15 1032 11/10/15 0903  CHOL 218* 217*  HDL 53 44  LDLCALC 134* 133*  TRIG 153* 200*  CHOLHDL 4.1 4.9*   TSH: No results for input(s): TSH in the last 8760 hours. A1C: Lab Results  Component Value Date   HGBA1C 5.6 11/23/2015     Assessment/Plan 1. Asthmatic bronchitis without complication, unspecified asthma severity, unspecified whether persistent -worse due to allergy season, cont on prednisone taper per pulmonary.  - albuterol (PROVENTIL HFA;VENTOLIN HFA) 108 (90 Base) MCG/ACT inhaler; Inhale 2 puffs into the lungs every 4 (four) hours as needed for wheezing or shortness of breath. RESCUE  Dispense: 1 Inhaler; Refill: prn -may use mucinex DM PO BID for cough and congestion  -educated on when to seek medical attention   Carlos American. Harle Battiest  [facility] [phone] 8 am - 5 pm) [phone] (after hours)

## 2016-02-13 NOTE — Patient Instructions (Addendum)
Cont on prednisone and other routine medication for allergies.   Call if no improvement or worsening of symptoms  Acute Bronchitis Bronchitis is inflammation of the airways that extend from the windpipe into the lungs (bronchi). The inflammation often causes mucus to develop. This leads to a cough, which is the most common symptom of bronchitis.  In acute bronchitis, the condition usually develops suddenly and goes away over time, usually in a couple weeks. Smoking, allergies, and asthma can make bronchitis worse. Repeated episodes of bronchitis may cause further lung problems.  CAUSES Acute bronchitis is most often caused by the same virus that causes a cold. The virus can spread from person to person (contagious) through coughing, sneezing, and touching contaminated objects. SIGNS AND SYMPTOMS   Cough.   Fever.   Coughing up mucus.   Body aches.   Chest congestion.   Chills.   Shortness of breath.   Sore throat.  DIAGNOSIS  Acute bronchitis is usually diagnosed through a physical exam. Your health care provider will also ask you questions about your medical history. Tests, such as chest X-rays, are sometimes done to rule out other conditions.  TREATMENT  Acute bronchitis usually goes away in a couple weeks. Oftentimes, no medical treatment is necessary. Medicines are sometimes given for relief of fever or cough. Antibiotic medicines are usually not needed but may be prescribed in certain situations. In some cases, an inhaler may be recommended to help reduce shortness of breath and control the cough. A cool mist vaporizer may also be used to help thin bronchial secretions and make it easier to clear the chest.  HOME CARE INSTRUCTIONS  Get plenty of rest.   Drink enough fluids to keep your urine clear or pale yellow (unless you have a medical condition that requires fluid restriction). Increasing fluids may help thin your respiratory secretions (sputum) and reduce chest  congestion, and it will prevent dehydration.   Take medicines only as directed by your health care provider.  If you were prescribed an antibiotic medicine, finish it all even if you start to feel better.  Avoid smoking and secondhand smoke. Exposure to cigarette smoke or irritating chemicals will make bronchitis worse. If you are a smoker, consider using nicotine gum or skin patches to help control withdrawal symptoms. Quitting smoking will help your lungs heal faster.   Reduce the chances of another bout of acute bronchitis by washing your hands frequently, avoiding people with cold symptoms, and trying not to touch your hands to your mouth, nose, or eyes.   Keep all follow-up visits as directed by your health care provider.  SEEK MEDICAL CARE IF: Your symptoms do not improve after 1 week of treatment.  SEEK IMMEDIATE MEDICAL CARE IF:  You develop an increased fever or chills.   You have chest pain.   You have severe shortness of breath.  You have bloody sputum.   You develop dehydration.  You faint or repeatedly feel like you are going to pass out.  You develop repeated vomiting.  You develop a severe headache. MAKE SURE YOU:   Understand these instructions.  Will watch your condition.  Will get help right away if you are not doing well or get worse.   This information is not intended to replace advice given to you by your health care provider. Make sure you discuss any questions you have with your health care provider.   Document Released: 06/06/2004 Document Revised: 05/20/2014 Document Reviewed: 10/20/2012 Elsevier Interactive Patient Education 2016 Elsevier  Inc.

## 2016-02-14 ENCOUNTER — Telehealth: Payer: Self-pay | Admitting: Internal Medicine

## 2016-02-14 NOTE — Telephone Encounter (Signed)
Called and spoke with pt and she stated that she talked to [facility] about only taking the allergy shots during the winter.  She stated that she stopped them over the summer but started back in September.  She stated that she had her first one and did ok.  She did her second one and she had the same local reaction to it.  She is wanting to know if she should continue these or stop them all together.  CY please advise. Thanks  Last ov--05/12/15 Next ov--no pending appts with CY  Allergies  Allergen Reactions  . Ace Inhibitors   . Amoxicillin     REACTION: unknown? pain in right kidney  . Benicar Hct [Olmesartan Medoxomil-Hctz]     Extreme weakness  . Budesonide-Formoterol Fumarate     Causes tremors and numbness  . Chlorzoxazone [Chlorzoxazone]   . Citalopram Hydrobromide     REACTION: hives  . Citalopram Hydrobromide   . Clonidine Derivatives   . Droperidol     REACTION: hives  . Flexeril [Cyclobenzaprine Hcl]   . Ketorolac Tromethamine     REACTION: hives  . Ketorolac Tromethamine   . Metoclopramide Hcl   . Mometasone Furo-Formoterol Fum     Causes sore throat, blurred vision and unable to sleep--started on med 09-17-10  . Morphine     REACTION: hives and itching  . Olmesartan Medoxomil     REACTION: fatique

## 2016-02-14 NOTE — Telephone Encounter (Signed)
Recommend she stop allergy shots

## 2016-02-14 NOTE — Telephone Encounter (Signed)
Called and spoke with pt and she is aware of CY recs.  Nothing further is needed.  

## 2016-02-15 NOTE — Telephone Encounter (Signed)
Thanks for forwarding the message. I'll take of things on my end.

## 2016-02-27 ENCOUNTER — Encounter: Payer: Self-pay | Admitting: Nurse Practitioner

## 2016-02-27 ENCOUNTER — Ambulatory Visit (INDEPENDENT_AMBULATORY_CARE_PROVIDER_SITE_OTHER): Payer: Medicare Other | Admitting: Nurse Practitioner

## 2016-02-27 VITALS — BP 138/78 | HR 98 | Temp 98.4°F | Resp 18 | Ht 64.0 in | Wt 188.2 lb

## 2016-02-27 DIAGNOSIS — Z23 Encounter for immunization: Secondary | ICD-10-CM | POA: Diagnosis not present

## 2016-02-27 DIAGNOSIS — I10 Essential (primary) hypertension: Secondary | ICD-10-CM | POA: Diagnosis not present

## 2016-02-27 DIAGNOSIS — E785 Hyperlipidemia, unspecified: Secondary | ICD-10-CM | POA: Diagnosis not present

## 2016-02-27 DIAGNOSIS — R739 Hyperglycemia, unspecified: Secondary | ICD-10-CM | POA: Diagnosis not present

## 2016-02-27 DIAGNOSIS — J45909 Unspecified asthma, uncomplicated: Secondary | ICD-10-CM | POA: Diagnosis not present

## 2016-02-27 DIAGNOSIS — F411 Generalized anxiety disorder: Secondary | ICD-10-CM | POA: Diagnosis not present

## 2016-02-27 DIAGNOSIS — K219 Gastro-esophageal reflux disease without esophagitis: Secondary | ICD-10-CM

## 2016-02-27 LAB — CBC WITH DIFFERENTIAL/PLATELET
Basophils Absolute: 86 cells/uL (ref 0–200)
Basophils Relative: 1 %
EOS PCT: 2 %
Eosinophils Absolute: 172 cells/uL (ref 15–500)
HEMATOCRIT: 42.7 % (ref 35.0–45.0)
Hemoglobin: 14.5 g/dL (ref 11.7–15.5)
LYMPHS PCT: 36 %
Lymphs Abs: 3096 cells/uL (ref 850–3900)
MCH: 28.8 pg (ref 27.0–33.0)
MCHC: 34 g/dL (ref 32.0–36.0)
MCV: 84.9 fL (ref 80.0–100.0)
MPV: 9.9 fL (ref 7.5–12.5)
Monocytes Absolute: 946 cells/uL (ref 200–950)
Monocytes Relative: 11 %
NEUTROS PCT: 50 %
Neutro Abs: 4300 cells/uL (ref 1500–7800)
Platelets: 316 10*3/uL (ref 140–400)
RBC: 5.03 MIL/uL (ref 3.80–5.10)
RDW: 14.1 % (ref 11.0–15.0)
WBC: 8.6 10*3/uL (ref 3.8–10.8)

## 2016-02-27 LAB — BASIC METABOLIC PANEL
BUN: 22 mg/dL (ref 7–25)
CALCIUM: 9.7 mg/dL (ref 8.6–10.4)
CO2: 29 mmol/L (ref 20–31)
CREATININE: 0.79 mg/dL (ref 0.60–0.93)
Chloride: 100 mmol/L (ref 98–110)
GLUCOSE: 117 mg/dL — AB (ref 65–99)
Potassium: 4.2 mmol/L (ref 3.5–5.3)
Sodium: 141 mmol/L (ref 135–146)

## 2016-02-27 NOTE — Progress Notes (Signed)
Careteam: Patient Care Team: Lauree Chandler, NP as PCP - General (Nurse Practitioner)  Advanced Directive information Does patient have an advance directive?: No, Would patient like information on creating an advanced directive?: Yes - Educational materials given  Allergies  Allergen Reactions  . Ace Inhibitors   . Amoxicillin     REACTION: unknown? pain in right kidney  . Benicar Hct [Olmesartan Medoxomil-Hctz]     Extreme weakness  . Budesonide-Formoterol Fumarate     Causes tremors and numbness  . Chlorzoxazone [Chlorzoxazone]   . Citalopram Hydrobromide     REACTION: hives  . Citalopram Hydrobromide   . Clonidine Derivatives   . Droperidol     REACTION: hives  . Flexeril [Cyclobenzaprine Hcl]   . Ketorolac Tromethamine     REACTION: hives  . Ketorolac Tromethamine   . Metoclopramide Hcl   . Mometasone Furo-Formoterol Fum     Causes sore throat, blurred vision and unable to sleep--started on med 09-17-10  . Morphine     REACTION: hives and itching  . Olmesartan Medoxomil     REACTION: fatique    Chief Complaint  Patient presents with  . Medical Management of Chronic Issues    3 month follow up.   . Other    Pt does not want flu vaccine today due to still having some congestion     HPI: Patient is a 75 y.o. female seen in the office today for routine follow up.  Pt completed prednisone pack and mucinex DM and feeling much better.   Has not eaten breakfast today, fasting Has not taken her medication this morning.  Has not followed diet modifications aware she needs to.  Weight has been up and down throughout the years   Going through some struggles, her ex-husband died and brother is dying.  Lot of pressure to do things in her community Has a strong faith   Occasional acid reflux, on protonix and using tums as well.   Review of Systems:  Review of Systems  Constitutional: Negative for activity change, appetite change, chills, fatigue and unexpected  weight change.  HENT: Positive for congestion.   Eyes: Negative.   Respiratory: Negative for cough and shortness of breath.   Cardiovascular: Negative for chest pain, palpitations and leg swelling.  Gastrointestinal: Negative for abdominal pain, constipation and diarrhea.  Genitourinary: Negative for difficulty urinating, flank pain and frequency.  Musculoskeletal: Negative for myalgias.  Skin: Negative for rash and wound.  Neurological: Negative for dizziness and weakness.    Past Medical History:  Diagnosis Date  . ALLERGIC RHINITIS   . Anxiety   . Asthma   . Blood type O+   . Cervical strain, acute   . Colon polyp   . Diverticulosis of colon   . Dizziness   . Fibromyalgia   . GERD (gastroesophageal reflux disease)   . History of nephrolithiasis   . Hypercholesterolemia    borderline  . Hypertension   . IBS (irritable bowel syndrome)   . Osteoarthritis   . Personal history of allergy to unspecified medicinal agent   . Postconcussion syndrome   . Vitamin D deficiency    Past Surgical History:  Procedure Laterality Date  . CHOLECYSTECTOMY, LAPAROSCOPIC  2004   for gallstone pancreatitis  . KNEE SURGERY Right   . KNEE SURGERY Left   . THYROID SURGERY     Social History:   reports that she quit smoking about 53 years ago. Her smoking use included Cigarettes. She  has a 16.00 pack-year smoking history. She has never used smokeless tobacco. She reports that she does not drink alcohol or use drugs.  Family History  Problem Relation Age of Onset  . Breast cancer Mother   . Alzheimer's disease Mother   . Heart disease Mother   . COPD Brother   . Heart disease Sister   . Alcohol abuse Sister   . Ovarian cancer Paternal Grandmother   . Arthritis Other     Cousins   . COPD Cousin     Maternal side   . Heart attack Maternal Uncle     Medications: Patient's Medications  New Prescriptions   No medications on file  Previous Medications   ACETAMINOPHEN (TYLENOL) 500  MG TABLET    Take 500 mg by mouth every 6 (six) hours as needed (for headache as needed).    ADVAIR DISKUS 250-50 MCG/DOSE AEPB    INHALE 1 PUFF BY MOUTH TWICE DAILY. RINSE MOUTH AFTER USE   ALBUTEROL (PROVENTIL HFA;VENTOLIN HFA) 108 (90 BASE) MCG/ACT INHALER    Inhale 2 puffs into the lungs every 4 (four) hours as needed for wheezing or shortness of breath. RESCUE   AMLODIPINE (NORVASC) 5 MG TABLET    TAKE 1 TABLET BY MOUTH DAILY FOR BLOOD PRESSURE   ASPIRIN 81 MG EC TABLET    Take 81 mg by mouth daily.     CETIRIZINE HCL (ZYRTEC ALLERGY) 10 MG CAPS    Take 1 capsule by mouth daily.     CHOLECALCIFEROL (VITAMIN D3) 1000 UNITS CAPS    Take by mouth daily.     FLUTICASONE (FLONASE) 50 MCG/ACT NASAL SPRAY    SHAKE LIQUID AND USE 1 TO 2 SPRAYS IN EACH NOSTRIL TWICE DAILY   HYDROCHLOROTHIAZIDE (MICROZIDE) 12.5 MG CAPSULE    TAKE 1 CAPSULE BY MOUTH EVERY DAY   MECLIZINE (ANTIVERT) 25 MG TABLET    TAKE 1 TABLET 3 TIMES A DAY AS NEEDED FOR DIZZINESS/NAUSEA   MULTIPLE VITAMINS-MINERALS (CENTRUM SILVER PO)    Take by mouth daily.     OMEGA-3 FATTY ACIDS (FISH OIL MAXIMUM STRENGTH) 1200 MG CAPS    Take by mouth. 2 by mouth daily   PANTOPRAZOLE (PROTONIX) 40 MG TABLET    TAKE 1 TABLET BY MOUTH EVERY DAY   POTASSIUM CHLORIDE SA (K-DUR,KLOR-CON) 20 MEQ TABLET    TAKE 1 TABLET BY MOUTH DAILY   PROPYLENE GLYCOL (SYSTANE BALANCE) 0.6 % SOLN    Place 2 drops into both eyes 2 (two) times daily.     SALINE NASAL SPRAY NA    Place 88 mcg into the nose 2 (two) times daily.  Modified Medications   No medications on file  Discontinued Medications   INJECTION DEVICE MISC    by Does not apply route. Allergy injection once every Monday, Administered by Dr.Young   NONFORMULARY OR COMPOUNDED ITEM    Allergy Vaccine 1:10 Given at [facility]   PREDNISONE (DELTASONE) 10 MG TABLET    Take 1 tablet by mouth daily.     Physical Exam:  Vitals:   02/27/16 0835  BP: 138/78  Pulse: 98  Resp: 18  Temp: 98.4 F  (36.9 C)  TempSrc: Oral  SpO2: 90%  Weight: 188 lb 3.2 oz (85.4 kg)  Height: _0  (1.626 m)   Body mass index is 32.3 kg/m.  Physical Exam  Constitutional: She is oriented to person, place, and time. She appears well-developed and well-nourished. No distress.  HENT:  Head: Normocephalic and  atraumatic.  Right Ear: External ear normal.  Left Ear: External ear normal.  Nose: Nose normal.  Mouth/Throat: Oropharynx is clear and moist. No oropharyngeal exudate.  Eyes: Conjunctivae and EOM are normal. Pupils are equal, round, and reactive to light.  Cardiovascular: Normal rate, regular rhythm and normal heart sounds.   Pulmonary/Chest: Effort normal and breath sounds normal. She has no wheezes.  Abdominal: Soft. Bowel sounds are normal. She exhibits no distension. There is no tenderness.  Musculoskeletal: Normal range of motion. She exhibits no edema or tenderness.  Neurological: She is alert and oriented to person, place, and time.  Skin: Skin is warm and dry. No rash noted. She is not diaphoretic. No erythema. No pallor.  Psychiatric: Her mood appears anxious.    Labs reviewed: Basic Metabolic Panel:  Recent Labs  03/30/15 1032 08/08/15 1519 11/10/15 0903  NA 142 140 139  K 3.8 3.6 4.0  CL 102 102 98  CO2 22  --  25  GLUCOSE 105* 94 126*  BUN 17 22* 20  CREATININE 0.75 0.70 0.74  CALCIUM 9.8  --  9.6   Liver Function Tests:  Recent Labs  03/30/15 1032 11/10/15 0903  AST 28 28  ALT 21 24  ALKPHOS 91 90  BILITOT 0.5 0.6  PROT 6.7 7.0  ALBUMIN 4.5 4.5   No results for input(s): LIPASE, AMYLASE in the last 8760 hours. No results for input(s): AMMONIA in the last 8760 hours. CBC:  Recent Labs  03/30/15 1032 08/08/15 1519  WBC 6.2  --   NEUTROABS 2.9  --   HGB  --  15.0  HCT 42.0 44.0  MCV 84  --   PLT 385*  --    Lipid Panel:  Recent Labs  03/30/15 1032 11/10/15 0903  CHOL 218* 217*  HDL 53 44  LDLCALC 134* 133*  TRIG 153* 200*  CHOLHDL 4.1  4.9*   TSH: No results for input(s): TSH in the last 8760 hours. A1C: Lab Results  Component Value Date   HGBA1C 5.6 11/23/2015     Assessment/Plan 1. Asthmatic bronchitis without complication, unspecified asthma severity, unspecified whether persistent Improved since she has completed prednisone and mucinex.   2. Hyperlipidemia, unspecified hyperlipidemia type -pt has not made dietary changes, agrees to make more changes in the next few months, will follow up lipid prior to next visit - Lipid panel; Future - CMP with eGFR; Future  3. Essential hypertension Elevated today however she did not take her HCTZ or norvasc - Basic metabolic panel - CBC with Differential/Platelets  4. Gastroesophageal reflux disease, esophagitis presence not specified Stable on protonix, will occasionally need TUMS   5. Anxiety state Not currently on medication, focuses on religion for coping  6. Hyperglycemia -encouraged diet modifications - Hemoglobin A1c; Future  Follow up in 4 months, lab prior to visit  Batul Diego K. Harle Battiest  [facility] [phone] 8 am - 5 pm) [phone] (after hours)

## 2016-02-27 NOTE — Patient Instructions (Signed)
Food Choices for Gastroesophageal Reflux Disease, Adult When you have gastroesophageal reflux disease (GERD), the foods you eat and your eating habits are very important. Choosing the right foods can help ease the discomfort of GERD. WHAT GENERAL GUIDELINES DO I NEED TO FOLLOW?  Choose fruits, vegetables, whole grains, low-fat dairy products, and low-fat meat, fish, and poultry.  Limit fats such as oils, salad dressings, butter, nuts, and avocado.  Keep a food diary to identify foods that cause symptoms.  Avoid foods that cause reflux. These may be different for different people.  Eat frequent small meals instead of three large meals each day.  Eat your meals slowly, in a relaxed setting.  Limit fried foods.  Cook foods using methods other than frying.  Avoid drinking alcohol.  Avoid drinking large amounts of liquids with your meals.  Avoid bending over or lying down until 2-3 hours after eating. WHAT FOODS ARE NOT RECOMMENDED? The following are some foods and drinks that may worsen your symptoms: Vegetables Tomatoes. Tomato juice. Tomato and spaghetti sauce. Chili peppers. Onion and garlic. Horseradish. Fruits Oranges, grapefruit, and lemon (fruit and juice). Meats High-fat meats, fish, and poultry. This includes hot dogs, ribs, ham, sausage, salami, and bacon. Dairy Whole milk and chocolate milk. Sour cream. Cream. Butter. Ice cream. Cream cheese.  Beverages Coffee and tea, with or without caffeine. Carbonated beverages or energy drinks. Condiments Hot sauce. Barbecue sauce.  Sweets/Desserts Chocolate and cocoa. Donuts. Peppermint and spearmint. Fats and Oils High-fat foods, including [place] fries and potato chips. Other Vinegar. Strong spices, such as black pepper, white pepper, red pepper, cayenne, curry powder, cloves, ginger, and chili powder. The items listed above may not be a complete list of foods and beverages to avoid. Contact your dietitian for more  information.   This information is not intended to replace advice given to you by your health care provider. Make sure you discuss any questions you have with your health care provider.   Document Released: 04/29/2005 Document Revised: 05/20/2014 Document Reviewed: 03/03/2013 Elsevier Interactive Patient Education 2016 [place] DASH stands for "Dietary Approaches to Stop Hypertension." The DASH eating plan is a healthy eating plan that has been shown to reduce high blood pressure (hypertension). Additional health benefits may include reducing the risk of type 2 diabetes mellitus, heart disease, and stroke. The DASH eating plan may also help with weight loss. WHAT DO I NEED TO KNOW ABOUT THE DASH EATING PLAN? For the DASH eating plan, you will follow these general guidelines:  Choose foods with a percent daily value for sodium of less than 5% (as listed on the food label).  Use salt-free seasonings or herbs instead of table salt or sea salt.  Check with your health care provider or pharmacist before using salt substitutes.  Eat lower-sodium products, often labeled as "lower sodium" or "no salt added."  Eat fresh foods.  Eat more vegetables, fruits, and low-fat dairy products.  Choose whole grains. Look for the word "whole" as the first word in the ingredient list.  Choose fish and skinless chicken or [place] more often than red meat. Limit fish, poultry, and meat to 6 oz (170 g) each day.  Limit sweets, desserts, sugars, and sugary drinks.  Choose heart-healthy fats.  Limit cheese to 1 oz (28 g) per day.  Eat more home-cooked food and less restaurant, buffet, and fast food.  Limit fried foods.  Cook foods using methods other than frying.  Limit canned vegetables.  If you do use them, rinse them well to decrease the sodium.  When eating at a restaurant, ask that your food be prepared with less salt, or no salt if possible. WHAT FOODS CAN I EAT? Seek help  from a dietitian for individual calorie needs. Grains Whole grain or whole wheat bread. Brown rice. Whole grain or whole wheat pasta. Quinoa, bulgur, and whole grain cereals. Low-sodium cereals. Corn or whole wheat flour tortillas. Whole grain cornbread. Whole grain crackers. Low-sodium crackers. Vegetables Fresh or frozen vegetables (raw, steamed, roasted, or grilled). Low-sodium or reduced-sodium tomato and vegetable juices. Low-sodium or reduced-sodium tomato sauce and paste. Low-sodium or reduced-sodium canned vegetables.  Fruits All fresh, canned (in natural juice), or frozen fruits. Meat and Other Protein Products Ground beef (85% or leaner), grass-fed beef, or beef trimmed of fat. Skinless chicken or [place]. Ground chicken or [place]. Pork trimmed of fat. All fish and seafood. Eggs. Dried beans, peas, or lentils. Unsalted nuts and seeds. Unsalted canned beans. Dairy Low-fat dairy products, such as skim or 1% milk, 2% or reduced-fat cheeses, low-fat ricotta or cottage cheese, or plain low-fat yogurt. Low-sodium or reduced-sodium cheeses. Fats and Oils Tub margarines without trans fats. Light or reduced-fat mayonnaise and salad dressings (reduced sodium). Avocado. Safflower, olive, or canola oils. Natural peanut or almond butter. Other Unsalted popcorn and pretzels. The items listed above may not be a complete list of recommended foods or beverages. Contact your dietitian for more options. WHAT FOODS ARE NOT RECOMMENDED? Grains White bread. White pasta. White rice. Refined cornbread. Bagels and croissants. Crackers that contain trans fat. Vegetables Creamed or fried vegetables. Vegetables in a cheese sauce. Regular canned vegetables. Regular canned tomato sauce and paste. Regular tomato and vegetable juices. Fruits Dried fruits. Canned fruit in light or heavy syrup. Fruit juice. Meat and Other Protein Products Fatty cuts of meat. Ribs, chicken wings, bacon, sausage, bologna, salami,  chitterlings, fatback, hot dogs, bratwurst, and packaged luncheon meats. Salted nuts and seeds. Canned beans with salt. Dairy Whole or 2% milk, cream, half-and-half, and cream cheese. Whole-fat or sweetened yogurt. Full-fat cheeses or blue cheese. Nondairy creamers and whipped toppings. Processed cheese, cheese spreads, or cheese curds. Condiments Onion and garlic salt, seasoned salt, table salt, and sea salt. Canned and packaged gravies. Worcestershire sauce. Tartar sauce. Barbecue sauce. Teriyaki sauce. Soy sauce, including reduced sodium. Steak sauce. Fish sauce. Oyster sauce. Cocktail sauce. Horseradish. Ketchup and mustard. Meat flavorings and tenderizers. Bouillon cubes. Hot sauce. Tabasco sauce. Marinades. Taco seasonings. Relishes. Fats and Oils Butter, stick margarine, lard, shortening, ghee, and bacon fat. Coconut, palm kernel, or palm oils. Regular salad dressings. Other Pickles and olives. Salted popcorn and pretzels. The items listed above may not be a complete list of foods and beverages to avoid. Contact your dietitian for more information. WHERE CAN I FIND MORE INFORMATION? National Heart, Lung, and Blood Institute: travelstabloid.com   This information is not intended to replace advice given to you by your health care provider. Make sure you discuss any questions you have with your health care provider.   Document Released: 04/18/2011 Document Revised: 05/20/2014 Document Reviewed: 03/03/2013 Elsevier Interactive Patient Education [employer].

## 2016-03-20 ENCOUNTER — Other Ambulatory Visit: Payer: Self-pay | Admitting: Nurse Practitioner

## 2016-03-29 ENCOUNTER — Telehealth: Payer: Self-pay

## 2016-03-29 NOTE — Telephone Encounter (Signed)
Patient is currently in the doughnut hole and was requesting samples of Advair. We do not currently have samples. Patient would like to know if you can advise on an alternative that we do have samples of   Please advise

## 2016-04-01 NOTE — Telephone Encounter (Signed)
Lets have her come in for an OV to discuss possible change in medication

## 2016-04-01 NOTE — Telephone Encounter (Signed)
Scheduled appointment for tomorrow at 9:00 am

## 2016-04-02 ENCOUNTER — Ambulatory Visit (INDEPENDENT_AMBULATORY_CARE_PROVIDER_SITE_OTHER): Payer: Medicare Other | Admitting: Nurse Practitioner

## 2016-04-02 ENCOUNTER — Encounter: Payer: Self-pay | Admitting: Nurse Practitioner

## 2016-04-02 VITALS — BP 124/78 | HR 82 | Temp 97.9°F | Ht 64.0 in | Wt 186.4 lb

## 2016-04-02 DIAGNOSIS — J4520 Mild intermittent asthma, uncomplicated: Secondary | ICD-10-CM | POA: Diagnosis not present

## 2016-04-02 DIAGNOSIS — J45909 Unspecified asthma, uncomplicated: Secondary | ICD-10-CM | POA: Diagnosis not present

## 2016-04-02 DIAGNOSIS — K219 Gastro-esophageal reflux disease without esophagitis: Secondary | ICD-10-CM

## 2016-04-02 DIAGNOSIS — M8580 Other specified disorders of bone density and structure, unspecified site: Secondary | ICD-10-CM | POA: Diagnosis not present

## 2016-04-02 MED ORDER — ESOMEPRAZOLE MAGNESIUM 40 MG PO CPDR: 40.0000 mg | DELAYED_RELEASE_CAPSULE | Freq: Every day | ORAL | 3 refills | Status: DC

## 2016-04-02 MED ORDER — FLUTICASONE FUROATE-VILANTEROL 100-25 MCG/INH IN AEPB: 1.0000 | INHALATION_SPRAY | Freq: Every day | RESPIRATORY_TRACT | 2 refills | Status: DC

## 2016-04-02 MED ORDER — PREDNISONE 10 MG PO TABS: 10.0000 mg | ORAL_TABLET | Freq: Every day | ORAL | 0 refills | Status: DC

## 2016-04-02 NOTE — Patient Instructions (Addendum)
To take BREO once you start feeling better in place of ADVAIR Take 1 puff daily for asthma. Call if you would like [place] to send Rx to your pharmacy   Recommended to take calcium 1200 mg day with Vit D 800 units daily for osteopenia  Increase water intake  Use albuterol 2 puffs every 4 hours as needed for cough, wheezing mucinex DM with full glass of water for twice daily for cough and congestion   Take prednisone if not seeing improvement/wheezing occurs    Upper Respiratory Infection, Adult Most upper respiratory infections (URIs) are a viral infection of the air passages leading to the lungs. A URI affects the nose, throat, and upper air passages. The most common type of URI is nasopharyngitis and is typically referred to as "the common cold." URIs run their course and usually go away on their own. Most of the time, a URI does not require medical attention, but sometimes a bacterial infection in the upper airways can follow a viral infection. This is called a secondary infection. Sinus and middle ear infections are common types of secondary upper respiratory infections. Bacterial pneumonia can also complicate a URI. A URI can worsen asthma and chronic obstructive pulmonary disease (COPD). Sometimes, these complications can require emergency medical care and may be life threatening. What are the causes? Almost all URIs are caused by viruses. A virus is a type of germ and can spread from one person to another. What increases the risk? You may be at risk for a URI if:  You smoke.  You have chronic heart or lung disease.  You have a weakened defense (immune) system.  You are very young or very old.  You have nasal allergies or asthma.  You work in crowded or poorly ventilated areas.  You work in health care facilities or schools. What are the signs or symptoms? Symptoms typically develop 2-3 days after you come in contact with a cold virus. Most viral URIs last 7-10 days. However,  viral URIs from the influenza virus (flu virus) can last 14-18 days and are typically more severe. Symptoms may include:  Runny or stuffy (congested) nose.  Sneezing.  Cough.  Sore throat.  Headache.  Fatigue.  Fever.  Loss of appetite.  Pain in your forehead, behind your eyes, and over your cheekbones (sinus pain).  Muscle aches. How is this diagnosed? Your health care provider may diagnose a URI by:  Physical exam.  Tests to check that your symptoms are not due to another condition such as:  Strep throat.  Sinusitis.  Pneumonia.  Asthma. How is this treated? A URI goes away on its own with time. It cannot be cured with medicines, but medicines may be prescribed or recommended to relieve symptoms. Medicines may help:  Reduce your fever.  Reduce your cough.  Relieve nasal congestion. Follow these instructions at home:  Take medicines only as directed by your health care provider.  Gargle warm saltwater or take cough drops to comfort your throat as directed by your health care provider.  Use a warm mist humidifier or inhale steam from a shower to increase air moisture. This may make it easier to breathe.  Drink enough fluid to keep your urine clear or pale yellow.  Eat soups and other clear broths and maintain good nutrition.  Rest as needed.  Return to work when your temperature has returned to normal or as your health care provider advises. You may need to stay home longer to avoid infecting  others. You can also use a face mask and careful hand washing to prevent spread of the virus.  Increase the usage of your inhaler if you have asthma.  Do not use any tobacco products, including cigarettes, chewing tobacco, or electronic cigarettes. If you need help quitting, ask your health care provider. How is this prevented? The best way to protect yourself from getting a cold is to practice good hygiene.  Avoid oral or hand contact with people with cold  symptoms.  Wash your hands often if contact occurs. There is no clear evidence that vitamin C, vitamin E, echinacea, or exercise reduces the chance of developing a cold. However, it is always recommended to get plenty of rest, exercise, and practice good nutrition. Contact a health care provider if:  You are getting worse rather than better.  Your symptoms are not controlled by medicine.  You have chills.  You have worsening shortness of breath.  You have brown or red mucus.  You have yellow or brown nasal discharge.  You have pain in your face, especially when you bend forward.  You have a fever.  You have swollen neck glands.  You have pain while swallowing.  You have white areas in the back of your throat. Get help right away if:  You have severe or persistent:  Headache.  Ear pain.  Sinus pain.  Chest pain.  You have chronic lung disease and any of the following:  Wheezing.  Prolonged cough.  Coughing up blood.  A change in your usual mucus.  You have a stiff neck.  You have changes in your:  Vision.  Hearing.  Thinking.  Mood. This information is not intended to replace advice given to you by your health care provider. Make sure you discuss any questions you have with your health care provider. Document Released: 10/23/2000 Document Revised: 12/31/2015 Document Reviewed: 08/04/2013 Elsevier Interactive Patient Education  2017 [employer].

## 2016-04-02 NOTE — Progress Notes (Signed)
Careteam: Patient Care Team: Lauree Chandler, NP as PCP - General (Nurse Practitioner)    Allergies  Allergen Reactions  . Ace Inhibitors   . Amoxicillin     REACTION: unknown? pain in right kidney  . Benicar Hct [Olmesartan Medoxomil-Hctz]     Extreme weakness  . Budesonide-Formoterol Fumarate     Causes tremors and numbness  . Chlorzoxazone [Chlorzoxazone]   . Citalopram Hydrobromide     REACTION: hives  . Citalopram Hydrobromide   . Clonidine Derivatives   . Droperidol     REACTION: hives  . Flexeril [Cyclobenzaprine Hcl]   . Ketorolac Tromethamine     REACTION: hives  . Ketorolac Tromethamine   . Metoclopramide Hcl   . Mometasone Furo-Formoterol Fum     Causes sore throat, blurred vision and unable to sleep--started on med 09-17-10  . Morphine     REACTION: hives and itching  . Olmesartan Medoxomil     REACTION: fatique    Chief Complaint  Patient presents with  . Acute Visit    Discuss alternative to Advair. Patient also c/o URI- productive cough, SOB, and hoarse      HPI: Patient is a 76 y.o. female seen in the office today due to wanting to change from Advair due to cost and she is in the doughnut hole. Actually request for samples, but we do not have any at this time. Has enough advair for 8 more days. Medication is 95$   Pt with cough and congestion in chest. Taking mucinex DM twice daily which has helped. Feels better today. Using albuterol once a day. No headache. Increase shortness of breath when she is coughing but resolves quickly.  No fevers or chills.  Review of Systems:  Review of Systems  Constitutional: Negative for activity change, appetite change, chills, fatigue and unexpected weight change.  HENT: Positive for congestion and postnasal drip. Negative for sinus pain and sore throat.   Eyes: Negative.   Respiratory: Positive for cough and shortness of breath. Negative for wheezing.   Cardiovascular: Negative for chest pain and  palpitations.  Gastrointestinal:       GERD  Musculoskeletal: Negative for myalgias.  Neurological: Negative for dizziness and weakness.    Past Medical History:  Diagnosis Date  . ALLERGIC RHINITIS   . Anxiety   . Asthma   . Blood type O+   . Cervical strain, acute   . Colon polyp   . Diverticulosis of colon   . Dizziness   . Fibromyalgia   . GERD (gastroesophageal reflux disease)   . History of nephrolithiasis   . Hypercholesterolemia    borderline  . Hypertension   . IBS (irritable bowel syndrome)   . Osteoarthritis   . Personal history of allergy to unspecified medicinal agent   . Postconcussion syndrome   . Vitamin D deficiency    Past Surgical History:  Procedure Laterality Date  . CHOLECYSTECTOMY, LAPAROSCOPIC  2004   for gallstone pancreatitis  . KNEE SURGERY Right   . KNEE SURGERY Left   . THYROID SURGERY     Social History:   reports that she quit smoking about 53 years ago. Her smoking use included Cigarettes. She has a 16.00 pack-year smoking history. She has never used smokeless tobacco. She reports that she does not drink alcohol or use drugs.  Family History  Problem Relation Age of Onset  . Breast cancer Mother   . Alzheimer's disease Mother   . Heart disease Mother   .  COPD Brother   . Heart disease Sister   . Alcohol abuse Sister   . Ovarian cancer Paternal Grandmother   . Arthritis Other     Cousins   . COPD Cousin     Maternal side   . Heart attack Maternal Uncle     Medications: Patient's Medications  New Prescriptions   ESOMEPRAZOLE (NEXIUM) 40 MG CAPSULE    Take 1 capsule (40 mg total) by mouth daily.   FLUTICASONE FUROATE-VILANTEROL (BREO ELLIPTA) 100-25 MCG/INH AEPB    Inhale 1 puff into the lungs daily.   PREDNISONE (DELTASONE) 10 MG TABLET    Take 1 tablet (10 mg total) by mouth daily with breakfast.  Previous Medications   ACETAMINOPHEN (TYLENOL) 500 MG TABLET    Take 500 mg by mouth every 6 (six) hours as needed (for headache  as needed).    ALBUTEROL (PROVENTIL HFA;VENTOLIN HFA) 108 (90 BASE) MCG/ACT INHALER    Inhale 2 puffs into the lungs every 4 (four) hours as needed for wheezing or shortness of breath. RESCUE   AMLODIPINE (NORVASC) 5 MG TABLET    TAKE 1 TABLET BY MOUTH DAILY FOR BLOOD PRESSURE   ASPIRIN 81 MG EC TABLET    Take 81 mg by mouth daily.     CETIRIZINE HCL (ZYRTEC ALLERGY) 10 MG CAPS    Take 1 capsule by mouth daily.     CHOLECALCIFEROL (VITAMIN D3) 1000 UNITS CAPS    Take by mouth daily.     DEXTROMETHORPHAN-GUAIFENESIN (MUCINEX DM) 30-600 MG TB12    Take 1-2 tablets by mouth 2 (two) times daily.   FLUTICASONE (FLONASE) 50 MCG/ACT NASAL SPRAY    SHAKE LIQUID AND USE 1 TO 2 SPRAYS IN EACH NOSTRIL TWICE DAILY   HYDROCHLOROTHIAZIDE (MICROZIDE) 12.5 MG CAPSULE    TAKE 1 CAPSULE BY MOUTH EVERY DAY   MECLIZINE (ANTIVERT) 25 MG TABLET    TAKE 1 TABLET 3 TIMES A DAY AS NEEDED FOR DIZZINESS/NAUSEA   MULTIPLE VITAMINS-MINERALS (CENTRUM SILVER PO)    Take by mouth daily.     OMEGA-3 FATTY ACIDS (FISH OIL MAXIMUM STRENGTH) 1200 MG CAPS    Take by mouth. 2 by mouth daily   POTASSIUM CHLORIDE SA (K-DUR,KLOR-CON) 20 MEQ TABLET    TAKE 1 TABLET BY MOUTH DAILY   PROPYLENE GLYCOL (SYSTANE BALANCE) 0.6 % SOLN    Place 2 drops into both eyes 2 (two) times daily.     SALINE NASAL SPRAY NA    Place 88 mcg into the nose 2 (two) times daily.  Modified Medications   No medications on file  Discontinued Medications   ADVAIR DISKUS 250-50 MCG/DOSE AEPB    INHALE 1 PUFF BY MOUTH TWICE DAILY. RINSE MOUTH AFTER USE   PANTOPRAZOLE (PROTONIX) 40 MG TABLET    TAKE 1 TABLET BY MOUTH EVERY DAY     Physical Exam:  Vitals:   04/02/16 0857  BP: 124/78  Pulse: 82  Temp: 97.9 F (36.6 C)  TempSrc: Oral  SpO2: 94%  Weight: 186 lb 6.4 oz (84.6 kg)  Height: 5' 4"  (1.626 m)   Body mass index is 32 kg/m.  Physical Exam  Constitutional: She is oriented to person, place, and time. She appears well-developed and well-nourished.  No distress.  HENT:  Head: Normocephalic and atraumatic.  Right Ear: External ear normal.  Left Ear: External ear normal.  Nose: Nose normal.  Mouth/Throat: Oropharynx is clear and moist. No oropharyngeal exudate.  Eyes: Conjunctivae and EOM are normal. Pupils  are equal, round, and reactive to light.  Cardiovascular: Normal rate, regular rhythm and normal heart sounds.   Pulmonary/Chest: Effort normal and breath sounds normal. No respiratory distress. She has no wheezes. She has no rales.  Abdominal: Soft. Bowel sounds are normal. She exhibits no distension. There is no tenderness.  Lymphadenopathy:    She has no cervical adenopathy.  Neurological: She is alert and oriented to person, place, and time.  Skin: Skin is warm and dry. She is not diaphoretic.  Psychiatric: Her mood appears anxious.    Labs reviewed: Basic Metabolic Panel:  Recent Labs  08/08/15 1519 11/10/15 0903 02/27/16 0911  NA 140 139 141  K 3.6 4.0 4.2  CL 102 98 100  CO2  --  25 29  GLUCOSE 94 126* 117*  BUN 22* 20 22  CREATININE 0.70 0.74 0.79  CALCIUM  --  9.6 9.7   Liver Function Tests:  Recent Labs  11/10/15 0903  AST 28  ALT 24  ALKPHOS 90  BILITOT 0.6  PROT 7.0  ALBUMIN 4.5   No results for input(s): LIPASE, AMYLASE in the last 8760 hours. No results for input(s): AMMONIA in the last 8760 hours. CBC:  Recent Labs  08/08/15 1519 02/27/16 0911  WBC  --  8.6  NEUTROABS  --  4,300  HGB 15.0 14.5  HCT 44.0 42.7  MCV  --  84.9  PLT  --  316   Lipid Panel:  Recent Labs  11/10/15 0903  CHOL 217*  HDL 44  LDLCALC 133*  TRIG 200*  CHOLHDL 4.9*   TSH: No results for input(s): TSH in the last 8760 hours. A1C: Lab Results  Component Value Date   HGBA1C 5.6 11/23/2015     Assessment/Plan 1. Asthmatic bronchitis without complication, unspecified asthma severity, unspecified whether persistent -cough, congestion for 4 days, better today. However due to the fact we are coming  up on a long holiday weekend  predniSONE (DELTASONE) 10 MG tablet; Take 1 tablet (10 mg total) by mouth daily with breakfast.  Dispense: 7 tablet; Refill: 0 given if pt starts having increase wheezing and cough. -to use albuterol 2 puffs every 4 hours as needed cough to help with symptoms -cont mucinex DM 1 tablet twice daily with full glass water -if symptoms persist or worsen to notify or seek medical attention.   2. Asthma, allergic, mild intermittent, uncomplicated -does not wish to cont advair due to high cost.  -samples given of fluticasone furoate-vilanterol (BREO ELLIPTA) 100-25 MCG/INH AEPB; Inhale 1 puff into the lungs daily.  Dispense: 60 each; Refill: 2 -educated pt that she may not tolerate due to list of intolerance (she did not tolerate Symbicort but did tolerate advair)    3. Gastroesophageal reflux disease, esophagitis presence not specified -pt has taken omeprazole, Protonix and OTC Nexium without relief, having to take 2 tablets OTC of Nexium to help with indigestion and burning sensation in chest. Also effecting her asthma.   - esomeprazole (NEXIUM) 40 MG capsule; Take 1 capsule (40 mg total) by mouth daily.  Dispense: 30 capsule; Refill: 3  4. Osteopenia, unspecified location To make sure she is taking calcium 1200 mg daily with Vit  D daily   Follow up in 4 weeks on asthma/medication change Alysabeth Scalia K. Harle Battiest  [facility] [phone] 8 am - 5 pm) [phone] (after hours)

## 2016-04-15 ENCOUNTER — Telehealth: Payer: Self-pay | Admitting: Internal Medicine

## 2016-04-15 NOTE — Telephone Encounter (Signed)
Suggest she use whichever- Advair or Breo, would be cheaper with her insurance. They are similar medications. We can prescribe it, if she is going to continue to be followed here, but we don't get enough samples anymore to maintain a person with samples long term. If she has Breo Samples that she hasn't tried, then she should try them- we can teach how to use it if needed.

## 2016-04-15 NOTE — Telephone Encounter (Signed)
Spoke with pt, requesting a sample of Advair.  I advised pt that Advair is not on her med list, and that we have not seen her in a year with no pending appointments.  Pt states her advair (uses 250/50 per pt) will cost her $95.  Pt's med list reflects that her PCP put her on Breo 100 and d/c'ed Advair.  Pt states she has not taken Breo but was given samples by her pcp to not take Breo "until she feels better".    CY please advise if ok to give Advair samples.  Thank you.   Last ov: 05/12/15 Next ov: none, no recall in chart.   Allergies  Allergen Reactions  . Ace Inhibitors   . Amoxicillin     REACTION: unknown? pain in right kidney  . Benicar Hct [Olmesartan Medoxomil-Hctz]     Extreme weakness  . Budesonide-Formoterol Fumarate     Causes tremors and numbness  . Chlorzoxazone [Chlorzoxazone]   . Citalopram Hydrobromide     REACTION: hives  . Citalopram Hydrobromide   . Clonidine Derivatives   . Droperidol     REACTION: hives  . Flexeril [Cyclobenzaprine Hcl]   . Ketorolac Tromethamine     REACTION: hives  . Ketorolac Tromethamine   . Metoclopramide Hcl   . Mometasone Furo-Formoterol Fum     Causes sore throat, blurred vision and unable to sleep--started on med 09-17-10  . Morphine     REACTION: hives and itching  . Olmesartan Medoxomil     REACTION: fatique   Current Outpatient Prescriptions on File Prior to Visit  Medication Sig Dispense Refill  . acetaminophen (TYLENOL) 500 MG tablet Take 500 mg by mouth every 6 (six) hours as needed (for headache as needed).     Marland Kitchen albuterol (PROVENTIL HFA;VENTOLIN HFA) 108 (90 Base) MCG/ACT inhaler Inhale 2 puffs into the lungs every 4 (four) hours as needed for wheezing or shortness of breath. RESCUE 1 Inhaler prn  . amLODipine (NORVASC) 5 MG tablet TAKE 1 TABLET BY MOUTH DAILY FOR BLOOD PRESSURE 30 tablet 6  . aspirin 81 MG EC tablet Take 81 mg by mouth daily.      . Cetirizine HCl (ZYRTEC ALLERGY) 10 MG CAPS Take 1 capsule by mouth  daily.      . Cholecalciferol (VITAMIN D3) 1000 UNITS CAPS Take by mouth daily.      Marland Kitchen Dextromethorphan-Guaifenesin (MUCINEX DM) 30-600 MG TB12 Take 1-2 tablets by mouth 2 (two) times daily.    Marland Kitchen esomeprazole (NEXIUM) 40 MG capsule Take 1 capsule (40 mg total) by mouth daily. 30 capsule 3  . fluticasone (FLONASE) 50 MCG/ACT nasal spray SHAKE LIQUID AND USE 1 TO 2 SPRAYS IN EACH NOSTRIL TWICE DAILY 16 g 6  . fluticasone furoate-vilanterol (BREO ELLIPTA) 100-25 MCG/INH AEPB Inhale 1 puff into the lungs daily. 60 each 2  . hydrochlorothiazide (MICROZIDE) 12.5 MG capsule TAKE 1 CAPSULE BY MOUTH EVERY DAY 30 capsule 6  . meclizine (ANTIVERT) 25 MG tablet TAKE 1 TABLET 3 TIMES A DAY AS NEEDED FOR DIZZINESS/NAUSEA 30 tablet 1  . Multiple Vitamins-Minerals (CENTRUM SILVER PO) Take by mouth daily.      . Omega-3 Fatty Acids (FISH OIL MAXIMUM STRENGTH) 1200 MG CAPS Take by mouth. 2 by mouth daily    . potassium chloride SA (K-DUR,KLOR-CON) 20 MEQ tablet TAKE 1 TABLET BY MOUTH DAILY 30 tablet 6  . predniSONE (DELTASONE) 10 MG tablet Take 1 tablet (10 mg total) by mouth daily with breakfast. 7  tablet 0  . Propylene Glycol (SYSTANE BALANCE) 0.6 % SOLN Place 2 drops into both eyes 2 (two) times daily.      Marland Kitchen SALINE NASAL SPRAY NA Place 88 mcg into the nose 2 (two) times daily.     No current facility-administered medications on file prior to visit.

## 2016-04-15 NOTE — Telephone Encounter (Signed)
Called spoke with patient who was primarily concerned with going back on her Advair without any issue.  Because of CY's recommendations she feels more confident restarting this.  She has already checked with her insurance and coverage will not be an issue for 2018.  She does not need samples at this time.  Nothing further needed; will sign off

## 2016-05-02 ENCOUNTER — Ambulatory Visit (INDEPENDENT_AMBULATORY_CARE_PROVIDER_SITE_OTHER): Payer: Medicare Other | Admitting: Nurse Practitioner

## 2016-05-02 ENCOUNTER — Encounter: Payer: Self-pay | Admitting: Nurse Practitioner

## 2016-05-02 VITALS — BP 132/78 | HR 82 | Temp 97.8°F | Resp 18 | Ht 64.0 in | Wt 183.6 lb

## 2016-05-02 DIAGNOSIS — K219 Gastro-esophageal reflux disease without esophagitis: Secondary | ICD-10-CM | POA: Diagnosis not present

## 2016-05-02 DIAGNOSIS — J4520 Mild intermittent asthma, uncomplicated: Secondary | ICD-10-CM

## 2016-05-02 MED ORDER — FLUTICASONE FUROATE-VILANTEROL 100-25 MCG/INH IN AEPB: 1.0000 | INHALATION_SPRAY | Freq: Every day | RESPIRATORY_TRACT | 2 refills | Status: DC

## 2016-05-02 NOTE — Progress Notes (Signed)
Careteam: Patient Care Team: Lauree Chandler, NP as PCP - General (Nurse Practitioner)  Advanced Directive information Does Patient Have a Medical Advance Directive?: No, Would patient like information on creating a medical advance directive?: Yes (MAU/Ambulatory/Procedural Areas - Information given) (Pt has paperwork but has not completed it yet. )  Allergies  Allergen Reactions  . Ace Inhibitors   . Amoxicillin     REACTION: unknown? pain in right kidney  . Benicar Hct [Olmesartan Medoxomil-Hctz]     Extreme weakness  . Budesonide-Formoterol Fumarate     Causes tremors and numbness  . Chlorzoxazone [Chlorzoxazone]   . Citalopram Hydrobromide     REACTION: hives  . Citalopram Hydrobromide   . Clonidine Derivatives   . Droperidol     REACTION: hives  . Flexeril [Cyclobenzaprine Hcl]   . Ketorolac Tromethamine     REACTION: hives  . Ketorolac Tromethamine   . Metoclopramide Hcl   . Mometasone Furo-Formoterol Fum     Causes sore throat, blurred vision and unable to sleep--started on med 09-17-10  . Morphine     REACTION: hives and itching  . Olmesartan Medoxomil     REACTION: fatique    Chief Complaint  Patient presents with  . Medical Management of Chronic Issues    4 week follow up on asthma/medication change. Request samples of BREO inhaler     HPI: Patient is a 75 y.o. female seen in the office today for a follow-up from changing her asthma medication.  At last visit on 11/21 asthma medication was change from Advair to [facility], ( due to cost) which the patient is very pleased with. The patient has not had to use her Albuterol recently. She is requesting one more sample of Breo as she is about to go out of town and will not have enough to last until she gets back after the new year, which is when she will be able to get the prescription filled.  Unable to afford Nexium at this time. taking pantoprazole 40 mg once a day with approximately 5 Tums per day which is taking  care of her GERD. She also avoids foods that she knows will trigger her GERD.   Review of Systems:  Review of Systems  Constitutional: Negative for activity change, appetite change, chills, fatigue and unexpected weight change.  HENT: Negative for congestion, postnasal drip, sinus pain and sore throat.   Eyes: Negative.   Respiratory: Negative for cough, shortness of breath and wheezing.   Cardiovascular: Negative for chest pain and palpitations.  Gastrointestinal:       GERD  Musculoskeletal: Negative for myalgias.  Neurological: Negative for dizziness and weakness.    Past Medical History:  Diagnosis Date  . ALLERGIC RHINITIS   . Anxiety   . Asthma   . Blood type O+   . Cervical strain, acute   . Colon polyp   . Diverticulosis of colon   . Dizziness   . Fibromyalgia   . GERD (gastroesophageal reflux disease)   . History of nephrolithiasis   . Hypercholesterolemia    borderline  . Hypertension   . IBS (irritable bowel syndrome)   . Osteoarthritis   . Personal history of allergy to unspecified medicinal agent   . Postconcussion syndrome   . Vitamin D deficiency    Past Surgical History:  Procedure Laterality Date  . CHOLECYSTECTOMY, LAPAROSCOPIC  2004   for gallstone pancreatitis  . KNEE SURGERY Right   . KNEE SURGERY Left   .  THYROID SURGERY     Social History:   reports that she quit smoking about 54 years ago. Her smoking use included Cigarettes. She has a 16.00 pack-year smoking history. She has never used smokeless tobacco. She reports that she does not drink alcohol or use drugs.  Family History  Problem Relation Age of Onset  . Breast cancer Mother   . Alzheimer's disease Mother   . Heart disease Mother   . COPD Brother   . Heart disease Sister   . Alcohol abuse Sister   . Ovarian cancer Paternal Grandmother   . Arthritis Other     Cousins   . COPD Cousin     Maternal side   . Heart attack Maternal Uncle     Medications: Patient's Medications    New Prescriptions   No medications on file  Previous Medications   ACETAMINOPHEN (TYLENOL) 500 MG TABLET    Take 500 mg by mouth every 6 (six) hours as needed (for headache as needed).    ALBUTEROL (PROVENTIL HFA;VENTOLIN HFA) 108 (90 BASE) MCG/ACT INHALER    Inhale 2 puffs into the lungs every 4 (four) hours as needed for wheezing or shortness of breath. RESCUE   AMLODIPINE (NORVASC) 5 MG TABLET    TAKE 1 TABLET BY MOUTH DAILY FOR BLOOD PRESSURE   ASPIRIN 81 MG EC TABLET    Take 81 mg by mouth daily.     CETIRIZINE HCL (ZYRTEC ALLERGY) 10 MG CAPS    Take 1 capsule by mouth daily.     CHOLECALCIFEROL (VITAMIN D3) 1000 UNITS CAPS    Take by mouth daily.     FLUTICASONE (FLONASE) 50 MCG/ACT NASAL SPRAY    SHAKE LIQUID AND USE 1 TO 2 SPRAYS IN EACH NOSTRIL TWICE DAILY   FLUTICASONE FUROATE-VILANTEROL (BREO ELLIPTA) 100-25 MCG/INH AEPB    Inhale 1 puff into the lungs daily.   HYDROCHLOROTHIAZIDE (MICROZIDE) 12.5 MG CAPSULE    TAKE 1 CAPSULE BY MOUTH EVERY DAY   MECLIZINE (ANTIVERT) 25 MG TABLET    TAKE 1 TABLET 3 TIMES A DAY AS NEEDED FOR DIZZINESS/NAUSEA   MULTIPLE VITAMINS-MINERALS (CENTRUM SILVER PO)    Take by mouth daily.     OMEGA-3 FATTY ACIDS (FISH OIL MAXIMUM STRENGTH) 1200 MG CAPS    Take by mouth. 2 by mouth daily   PANTOPRAZOLE (PROTONIX) 40 MG TABLET    Take 1 tablet by mouth daily. Before breakfast   POTASSIUM CHLORIDE SA (K-DUR,KLOR-CON) 20 MEQ TABLET    TAKE 1 TABLET BY MOUTH DAILY   PROPYLENE GLYCOL (SYSTANE BALANCE) 0.6 % SOLN    Place 2 drops into both eyes 2 (two) times daily.     SALINE NASAL SPRAY NA    Place 88 mcg into the nose 2 (two) times daily.  Modified Medications   No medications on file  Discontinued Medications   DEXTROMETHORPHAN-GUAIFENESIN (MUCINEX DM) 30-600 MG TB12    Take 1-2 tablets by mouth 2 (two) times daily.   ESOMEPRAZOLE (NEXIUM) 40 MG CAPSULE    Take 1 capsule (40 mg total) by mouth daily.   PREDNISONE (DELTASONE) 10 MG TABLET    Take 1 tablet (10  mg total) by mouth daily with breakfast.     Physical Exam:  Vitals:   05/02/16 0845  BP: 132/78  Pulse: 82  Resp: 18  Temp: 97.8 F (36.6 C)  TempSrc: Oral  SpO2: 95%  Weight: 183 lb 9.6 oz (83.3 kg)  Height: 5' 4"  (1.626 m)  Body mass index is 31.51 kg/m.  Physical Exam  Constitutional: She is oriented to person, place, and time. She appears well-developed and well-nourished. No distress.  HENT:  Head: Normocephalic and atraumatic.  Mouth/Throat: No oropharyngeal exudate.  Eyes: EOM are normal. Pupils are equal, round, and reactive to light.  Cardiovascular: Normal rate, regular rhythm and normal heart sounds.   Pulmonary/Chest: Effort normal. No respiratory distress. She has no rales.  Abdominal: Soft. Bowel sounds are normal. She exhibits no distension. There is no tenderness.  Neurological: She is alert and oriented to person, place, and time.  Skin: Skin is warm and dry. She is not diaphoretic.  Psychiatric: She has a normal mood and affect. Her behavior is normal. Judgment and thought content normal.    Labs reviewed: Basic Metabolic Panel:  Recent Labs  08/08/15 1519 11/10/15 0903 02/27/16 0911  NA 140 139 141  K 3.6 4.0 4.2  CL 102 98 100  CO2  --  25 29  GLUCOSE 94 126* 117*  BUN 22* 20 22  CREATININE 0.70 0.74 0.79  CALCIUM  --  9.6 9.7   Liver Function Tests:  Recent Labs  11/10/15 0903  AST 28  ALT 24  ALKPHOS 90  BILITOT 0.6  PROT 7.0  ALBUMIN 4.5   No results for input(s): LIPASE, AMYLASE in the last 8760 hours. No results for input(s): AMMONIA in the last 8760 hours. CBC:  Recent Labs  08/08/15 1519 02/27/16 0911  WBC  --  8.6  NEUTROABS  --  4,300  HGB 15.0 14.5  HCT 44.0 42.7  MCV  --  84.9  PLT  --  316   Lipid Panel:  Recent Labs  11/10/15 0903  CHOL 217*  HDL 44  LDLCALC 133*  TRIG 200*  CHOLHDL 4.9*   TSH: No results for input(s): TSH in the last 8760 hours. A1C: Lab Results  Component Value Date    HGBA1C 5.6 11/23/2015     Assessment/Plan 1. Asthma, allergic, mild intermittent, uncomplicated - Controlled. Patient very happy with Breo. Working better than Advair. Not having to use Albuterol inhaler. No adverse effects noted while taking breo.  - 2 samples of Breo 100-25 given in office - fluticasone furoate-vilanterol (BREO ELLIPTA) 100-25 MCG/INH AEPB; Inhale 1 puff into the lungs daily.  Dispense: 60 each; Refill: 2  2. Gastroesophageal reflux disease, esophagitis presence not specified - Unable to afford Nexium with current insurance. - Controlled taking Protonix 40 mg daily and ~5 Tums daily, in addition to avoiding dietary triggers.    Follow-up in February as scheduled.   Carlos American. Harle Battiest  [facility] [phone] 8 am - 5 pm) [phone] (after hours)

## 2016-05-21 ENCOUNTER — Other Ambulatory Visit: Payer: Self-pay | Admitting: *Deleted

## 2016-05-23 ENCOUNTER — Encounter: Payer: Self-pay | Admitting: Nurse Practitioner

## 2016-05-23 ENCOUNTER — Ambulatory Visit (INDEPENDENT_AMBULATORY_CARE_PROVIDER_SITE_OTHER): Payer: PPO | Admitting: Nurse Practitioner

## 2016-05-23 VITALS — BP 134/80 | HR 79 | Temp 97.5°F | Resp 18 | Ht 64.0 in | Wt 183.8 lb

## 2016-05-23 DIAGNOSIS — J4520 Mild intermittent asthma, uncomplicated: Secondary | ICD-10-CM

## 2016-05-23 DIAGNOSIS — J45909 Unspecified asthma, uncomplicated: Secondary | ICD-10-CM | POA: Diagnosis not present

## 2016-05-23 DIAGNOSIS — J0140 Acute pansinusitis, unspecified: Secondary | ICD-10-CM | POA: Diagnosis not present

## 2016-05-23 MED ORDER — ALBUTEROL SULFATE HFA 108 (90 BASE) MCG/ACT IN AERS: 2.0000 | INHALATION_SPRAY | RESPIRATORY_TRACT | 1 refills | Status: DC | PRN

## 2016-05-23 MED ORDER — FLUTICASONE-SALMETEROL 250-50 MCG/DOSE IN AEPB: 1.0000 | INHALATION_SPRAY | Freq: Two times a day (BID) | RESPIRATORY_TRACT | 1 refills | Status: DC

## 2016-05-23 MED ORDER — FLUTICASONE PROPIONATE 50 MCG/ACT NA SUSP: NASAL | 3 refills | Status: DC

## 2016-05-23 MED ORDER — AMLODIPINE BESYLATE 5 MG PO TABS: 5.0000 mg | ORAL_TABLET | Freq: Every day | ORAL | 1 refills | Status: DC

## 2016-05-23 MED ORDER — HYDROCHLOROTHIAZIDE 12.5 MG PO CAPS: ORAL_CAPSULE | ORAL | 1 refills | Status: DC

## 2016-05-23 MED ORDER — MECLIZINE HCL 25 MG PO TABS: ORAL_TABLET | ORAL | 1 refills | Status: DC

## 2016-05-23 MED ORDER — PANTOPRAZOLE SODIUM 40 MG PO TBEC: 40.0000 mg | DELAYED_RELEASE_TABLET | Freq: Every day | ORAL | 1 refills | Status: DC

## 2016-05-23 MED ORDER — POTASSIUM CHLORIDE CRYS ER 20 MEQ PO TBCR: 20.0000 meq | EXTENDED_RELEASE_TABLET | Freq: Every day | ORAL | 1 refills | Status: DC

## 2016-05-23 MED ORDER — FLUTICASONE-SALMETEROL 250-50 MCG/DOSE IN AEPB: 1.0000 | INHALATION_SPRAY | Freq: Two times a day (BID) | RESPIRATORY_TRACT | 3 refills | Status: DC

## 2016-05-23 MED ORDER — PROPYLENE GLYCOL 0.6 % OP SOLN: 2.0000 [drp] | Freq: Two times a day (BID) | OPHTHALMIC | 1 refills | Status: DC

## 2016-05-23 NOTE — Patient Instructions (Addendum)
Use your Albuterol rescue inhaler as needed when you are feeling short of breath and/or wheezing.  advair sent to pharmacy- start today  Plain saline as needed throughout the day  mucinex DM by mouth with full glass of water twice daily for 1 week then as needed cough and congestion    Sinusitis, Adult Sinusitis is soreness and inflammation of your sinuses. Sinuses are hollow spaces in the bones around your face. They are located:  Around your eyes.  In the middle of your forehead.  Behind your nose.  In your cheekbones. Your sinuses and nasal passages are lined with a stringy fluid (mucus). Mucus normally drains out of your sinuses. When your nasal tissues get inflamed or swollen, the mucus can get trapped or blocked so air cannot flow through your sinuses. This lets bacteria, viruses, and funguses grow, and that leads to infection. Follow these instructions at home: Medicines  Take, use, or apply over-the-counter and prescription medicines only as told by your doctor. These may include nasal sprays.  If you were prescribed an antibiotic medicine, take it as told by your doctor. Do not stop taking the antibiotic even if you start to feel better. Hydrate and Humidify  Drink enough water to keep your pee (urine) clear or pale yellow.  Use a cool mist humidifier to keep the humidity level in your home above 50%.  Breathe in steam for 10-15 minutes, 3-4 times a day or as told by your doctor. You can do this in the bathroom while a hot shower is running.  Try not to spend time in cool or dry air. Rest  Rest as much as possible.  Sleep with your head raised (elevated).  Make sure to get enough sleep each night. General instructions  Put a warm, moist washcloth on your face 3-4 times a day or as told by your doctor. This will help with discomfort.  Wash your hands often with soap and water. If there is no soap and water, use hand sanitizer.  Do not smoke. Avoid being around  people who are smoking (secondhand smoke).  Keep all follow-up visits as told by your doctor. This is important. Contact a doctor if:  You have a fever.  Your symptoms get worse.  Your symptoms do not get better within 10 days. Get help right away if:  You have a very bad headache.  You cannot stop throwing up (vomiting).  You have pain or swelling around your face or eyes.  You have trouble seeing.  You feel confused.  Your neck is stiff.  You have trouble breathing. This information is not intended to replace advice given to you by your health care provider. Make sure you discuss any questions you have with your health care provider. Document Released: 10/16/2007 Document Revised: 12/24/2015 Document Reviewed: 02/22/2015 Elsevier Interactive Patient Education  2017 [employer].

## 2016-05-23 NOTE — Progress Notes (Signed)
Careteam: Patient Care Team: Lauree Chandler, NP as PCP - General (Nurse Practitioner)  Advanced Directive information Does Patient Have a Medical Advance Directive?: No  Allergies  Allergen Reactions  . Ace Inhibitors   . Amoxicillin     REACTION: unknown? pain in right kidney  . Benicar Hct [Olmesartan Medoxomil-Hctz]     Extreme weakness  . Budesonide-Formoterol Fumarate     Causes tremors and numbness  . Chlorzoxazone [Chlorzoxazone]   . Citalopram Hydrobromide     REACTION: hives  . Citalopram Hydrobromide   . Clonidine Derivatives   . Droperidol     REACTION: hives  . Flexeril [Cyclobenzaprine Hcl]   . Ketorolac Tromethamine     REACTION: hives  . Ketorolac Tromethamine   . Metoclopramide Hcl   . Mometasone Furo-Formoterol Fum     Causes sore throat, blurred vision and unable to sleep--started on med 09-17-10  . Morphine     REACTION: hives and itching  . Olmesartan Medoxomil     REACTION: fatique    Chief Complaint  Patient presents with  . Acute Visit    Cough, sore throat, headache, facial pain/pressure x 2 days  . Other    Thinks she is having a reaction (itching) to breo inhaler; would like to go back on advair     HPI: Patient is a 76 y.o. female seen in the office today with complaints of cough, sore throat, and headache that began 2 days ago. Productive cough with thick yellow/green sputum x4 days. Additional symptoms include chest congestion, runny nose, sneezing, sinus pain/pressure, left ear pain, and occasional shortness of breath with wheezing. Reports she was putting away the Christmas stuff and doing things like that in which dust is stirred up always gets her allergies going. Treatments tried include Flonase, eye drops, nasal saline spray. Only has needed her Albuterol inhaler once 2 days ago.  Quit taking Breo yesterday due to itching and rash. She read that she should not use Breo if she is allergic to milk products and she reports she is  lactose intolerant. Also reports she read Memory Dance can cause dizziness which she has been experiencing since starting it. Would like to go back on Advair. No fevers or chills.   Review of Systems:  Review of Systems  Constitutional: Negative for chills and fever.  HENT: Positive for congestion (chest), ear pain (left ear), rhinorrhea, sinus pain, sinus pressure, sneezing and sore throat.   Respiratory: Positive for cough (productive), shortness of breath (occ. mild) and wheezing (little bit).   Cardiovascular: Negative for chest pain and palpitations.  Gastrointestinal: Negative for diarrhea and nausea.  Skin: Positive for rash.  Neurological: Positive for dizziness and headaches. Negative for light-headedness.    Past Medical History:  Diagnosis Date  . ALLERGIC RHINITIS   . Anxiety   . Asthma   . Blood type O+   . Cervical strain, acute   . Colon polyp   . Diverticulosis of colon   . Dizziness   . Fibromyalgia   . GERD (gastroesophageal reflux disease)   . History of nephrolithiasis   . Hypercholesterolemia    borderline  . Hypertension   . IBS (irritable bowel syndrome)   . Osteoarthritis   . Personal history of allergy to unspecified medicinal agent   . Postconcussion syndrome   . Vitamin D deficiency    Past Surgical History:  Procedure Laterality Date  . CHOLECYSTECTOMY, LAPAROSCOPIC  2004   for gallstone pancreatitis  . KNEE  SURGERY Right   . KNEE SURGERY Left   . THYROID SURGERY     Social History:   reports that she quit smoking about 54 years ago. Her smoking use included Cigarettes. She has a 16.00 pack-year smoking history. She has never used smokeless tobacco. She reports that she does not drink alcohol or use drugs.  Family History  Problem Relation Age of Onset  . Breast cancer Mother   . Alzheimer's disease Mother   . Heart disease Mother   . COPD Brother   . Heart disease Sister   . Alcohol abuse Sister   . Ovarian cancer Paternal Grandmother   .  Arthritis Other     Cousins   . COPD Cousin     Maternal side   . Heart attack Maternal Uncle     Medications: Patient's Medications  New Prescriptions   No medications on file  Previous Medications   ACETAMINOPHEN (TYLENOL) 500 MG TABLET    Take 500 mg by mouth every 6 (six) hours as needed (for headache as needed).    ALBUTEROL (PROVENTIL HFA;VENTOLIN HFA) 108 (90 BASE) MCG/ACT INHALER    Inhale 2 puffs into the lungs every 4 (four) hours as needed for wheezing or shortness of breath. RESCUE   AMLODIPINE (NORVASC) 5 MG TABLET    TAKE 1 TABLET BY MOUTH DAILY FOR BLOOD PRESSURE   ASPIRIN 81 MG EC TABLET    Take 81 mg by mouth daily.     CETIRIZINE HCL (ZYRTEC ALLERGY) 10 MG CAPS    Take 1 capsule by mouth daily.     CHOLECALCIFEROL (VITAMIN D3) 1000 UNITS CAPS    Take by mouth daily.     FLUTICASONE (FLONASE) 50 MCG/ACT NASAL SPRAY    SHAKE LIQUID AND USE 1 TO 2 SPRAYS IN EACH NOSTRIL TWICE DAILY   HYDROCHLOROTHIAZIDE (MICROZIDE) 12.5 MG CAPSULE    TAKE 1 CAPSULE BY MOUTH EVERY DAY   MECLIZINE (ANTIVERT) 25 MG TABLET    TAKE 1 TABLET 3 TIMES A DAY AS NEEDED FOR DIZZINESS/NAUSEA   MULTIPLE VITAMINS-MINERALS (CENTRUM SILVER PO)    Take by mouth daily.     OMEGA-3 FATTY ACIDS (FISH OIL MAXIMUM STRENGTH) 1200 MG CAPS    Take by mouth. 2 by mouth daily   PANTOPRAZOLE (PROTONIX) 40 MG TABLET    Take 1 tablet by mouth daily. Before breakfast   POTASSIUM CHLORIDE SA (K-DUR,KLOR-CON) 20 MEQ TABLET    TAKE 1 TABLET BY MOUTH DAILY   PROPYLENE GLYCOL (SYSTANE BALANCE) 0.6 % SOLN    Place 2 drops into both eyes 2 (two) times daily.     SALINE NASAL SPRAY NA    Place 88 mcg into the nose 2 (two) times daily.  Modified Medications   No medications on file  Discontinued Medications   FLUTICASONE FUROATE-VILANTEROL (BREO ELLIPTA) 100-25 MCG/INH AEPB    Inhale 1 puff into the lungs daily.     Physical Exam:  Vitals:   05/23/16 0839  BP: 134/80  Pulse: 79  Resp: 18  Temp: 97.5 F (36.4 C)    TempSrc: Oral  SpO2: 97%  Weight: 183 lb 12.8 oz (83.4 kg)  Height: 5' 4"  (1.626 m)   Body mass index is 31.55 kg/m.  Physical Exam  Constitutional: She is oriented to person, place, and time. She appears well-developed and well-nourished. No distress.  HENT:  Head: Normocephalic and atraumatic.  Right Ear: Tympanic membrane, external ear and ear canal normal.  Left Ear: Tympanic membrane,  external ear and ear canal normal.  Mouth/Throat: Oropharynx is clear and moist. No oropharyngeal exudate.  Eyes: Pupils are equal, round, and reactive to light.  Neck: Normal range of motion. Neck supple. No thyromegaly present.  Cardiovascular: Normal rate, regular rhythm and normal heart sounds.   Pulmonary/Chest: Effort normal and breath sounds normal. No respiratory distress. She has no wheezes. She has no rales.  Abdominal: Soft. Bowel sounds are normal. She exhibits no distension. There is no tenderness.  Musculoskeletal: Normal range of motion.  Lymphadenopathy:    She has no cervical adenopathy.  Neurological: She is alert and oriented to person, place, and time.  Skin: Skin is warm and dry. She is not diaphoretic.  Psychiatric: She has a normal mood and affect. Her behavior is normal. Judgment and thought content normal.    Labs reviewed: Basic Metabolic Panel:  Recent Labs  08/08/15 1519 11/10/15 0903 02/27/16 0911  NA 140 139 141  K 3.6 4.0 4.2  CL 102 98 100  CO2  --  25 29  GLUCOSE 94 126* 117*  BUN 22* 20 22  CREATININE 0.70 0.74 0.79  CALCIUM  --  9.6 9.7   Liver Function Tests:  Recent Labs  11/10/15 0903  AST 28  ALT 24  ALKPHOS 90  BILITOT 0.6  PROT 7.0  ALBUMIN 4.5   No results for input(s): LIPASE, AMYLASE in the last 8760 hours. No results for input(s): AMMONIA in the last 8760 hours. CBC:  Recent Labs  08/08/15 1519 02/27/16 0911  WBC  --  8.6  NEUTROABS  --  4,300  HGB 15.0 14.5  HCT 44.0 42.7  MCV  --  84.9  PLT  --  316   Lipid  Panel:  Recent Labs  11/10/15 0903  CHOL 217*  HDL 44  LDLCALC 133*  TRIG 200*  CHOLHDL 4.9*   TSH: No results for input(s): TSH in the last 8760 hours. A1C: Lab Results  Component Value Date   HGBA1C 5.6 11/23/2015     Assessment/Plan 1. Acute non-recurrent pansinusitis - Continue saline nasal spray throughout the day as needed for nasal congestion. - Nettipot BID - Mucinex DM 1 tablet twice a day with a full glass of water x1 week - Increase hydration and good nutrition - Use Albuterol inhaler PRN for SOB/wheezing -most likely viral and should self resolve, if symptoms persist to call office or seek medical attention if worsening of symptoms/shortness of breath/wheezing occurs  2. Asthma, allergic, mild intermittent, uncomplicated - Breo discontinued d/t reaction of itching, rash, and dizziness - Begin today - Fluticasone-Salmeterol (ADVAIR DISKUS) 250-50 MCG/DOSE AEPB; Inhale 1 puff into the lungs 2 (two) times daily.  Dispense: 1 each; Refill: 3 - Albuterol inhaler PRN for SOB/wheezing   Call if symptoms worsen or fail to improve by next week.   Carlos American. Harle Battiest  [facility] [phone] 8 am - 5 pm) [phone] (after hours)

## 2016-06-13 ENCOUNTER — Ambulatory Visit (INDEPENDENT_AMBULATORY_CARE_PROVIDER_SITE_OTHER): Payer: PPO | Admitting: Nurse Practitioner

## 2016-06-13 ENCOUNTER — Encounter: Payer: Self-pay | Admitting: Nurse Practitioner

## 2016-06-13 VITALS — BP 128/82 | HR 90 | Temp 98.3°F | Resp 18 | Ht 64.0 in | Wt 184.8 lb

## 2016-06-13 DIAGNOSIS — J0140 Acute pansinusitis, unspecified: Secondary | ICD-10-CM | POA: Diagnosis not present

## 2016-06-13 MED ORDER — DOXYCYCLINE HYCLATE 100 MG PO TABS: 100.0000 mg | ORAL_TABLET | Freq: Two times a day (BID) | ORAL | 0 refills | Status: DC

## 2016-06-13 NOTE — Patient Instructions (Signed)
Start doxycyline 100 mg by mouth twice daily for 10 days- take all of medication May use delsym (which is over the counter) as needed for cough -- this will not help congestion though     Sinusitis, Adult Sinusitis is soreness and inflammation of your sinuses. Sinuses are hollow spaces in the bones around your face. Your sinuses are located:  Around your eyes.  In the middle of your forehead.  Behind your nose.  In your cheekbones. Your sinuses and nasal passages are lined with a stringy fluid (mucus). Mucus normally drains out of your sinuses. When your nasal tissues become inflamed or swollen, the mucus can become trapped or blocked so air cannot flow through your sinuses. This allows bacteria, viruses, and funguses to grow, which leads to infection. Sinusitis can develop quickly and last for 7?10 days (acute) or for more than 12 weeks (chronic). Sinusitis often develops after a cold. What are the causes? This condition is caused by anything that creates swelling in the sinuses or stops mucus from draining, including:  Allergies.  Asthma.  Bacterial or viral infection.  Abnormally shaped bones between the nasal passages.  Nasal growths that contain mucus (nasal polyps).  Narrow sinus openings.  Pollutants, such as chemicals or irritants in the air.  A foreign object stuck in the nose.  A fungal infection. This is rare. What increases the risk? The following factors may make you more likely to develop this condition:  Having allergies or asthma.  Having had a recent cold or respiratory tract infection.  Having structural deformities or blockages in your nose or sinuses.  Having a weak immune system.  Doing a lot of swimming or diving.  Overusing nasal sprays.  Smoking. What are the signs or symptoms? The main symptoms of this condition are pain and a feeling of pressure around the affected sinuses. Other symptoms include:  Upper  toothache.  Earache.  Headache.  Bad breath.  Decreased sense of smell and taste.  A cough that may get worse at night.  Fatigue.  Fever.  Thick drainage from your nose. The drainage is often green and it may contain pus (purulent).  Stuffy nose or congestion.  Postnasal drip. This is when extra mucus collects in the throat or back of the nose.  Swelling and warmth over the affected sinuses.  Sore throat.  Sensitivity to light. How is this diagnosed? This condition is diagnosed based on symptoms, a medical history, and a physical exam. To find out if your condition is acute or chronic, your health care provider may:  Look in your nose for signs of nasal polyps.  Tap over the affected sinus to check for signs of infection.  View the inside of your sinuses using an imaging device that has a light attached (endoscope). If your health care provider suspects that you have chronic sinusitis, you may also:  Be tested for allergies.  Have a sample of mucus taken from your nose (nasal culture) and checked for bacteria.  Have a mucus sample examined to see if your sinusitis is related to an allergy. If your sinusitis does not respond to treatment and it lasts longer than 8 weeks, you may have an MRI or CT scan to check your sinuses. These scans also help to determine how severe your infection is. In rare cases, a bone biopsy may be done to rule out more serious types of fungal sinus disease. How is this treated? Treatment for sinusitis depends on the cause and whether your  condition is chronic or acute. If a virus is causing your sinusitis, your symptoms will go away on their own within 10 days. You may be given medicines to relieve your symptoms, including:  Topical nasal decongestants. They shrink swollen nasal passages and let mucus drain from your sinuses.  Antihistamines. These drugs block inflammation that is triggered by allergies. This can help to ease swelling in your  nose and sinuses.  Topical nasal corticosteroids. These are nasal sprays that ease inflammation and swelling in your nose and sinuses.  Nasal saline washes. These rinses can help to get rid of thick mucus in your nose. If your condition is caused by bacteria, you will be given an antibiotic medicine. If your condition is caused by a fungus, you will be given an antifungal medicine. Surgery may be needed to correct underlying conditions, such as narrow nasal passages. Surgery may also be needed to remove polyps. Follow these instructions at home: Medicines  Take, use, or apply over-the-counter and prescription medicines only as told by your health care provider. These may include nasal sprays.  If you were prescribed an antibiotic medicine, take it as told by your health care provider. Do not stop taking the antibiotic even if you start to feel better. Hydrate and Humidify  Drink enough water to keep your urine clear or pale yellow. Staying hydrated will help to thin your mucus.  Use a cool mist humidifier to keep the humidity level in your home above 50%.  Inhale steam for 10-15 minutes, 3-4 times a day or as told by your health care provider. You can do this in the bathroom while a hot shower is running.  Limit your exposure to cool or dry air. Rest  Rest as much as possible.  Sleep with your head raised (elevated).  Make sure to get enough sleep each night. General instructions  Apply a warm, moist washcloth to your face 3-4 times a day or as told by your health care provider. This will help with discomfort.  Wash your hands often with soap and water to reduce your exposure to viruses and other germs. If soap and water are not available, use hand sanitizer.  Do not smoke. Avoid being around people who are smoking (secondhand smoke).  Keep all follow-up visits as told by your health care provider. This is important. Contact a health care provider if:  You have a  fever.  Your symptoms get worse.  Your symptoms do not improve within 10 days. Get help right away if:  You have a severe headache.  You have persistent vomiting.  You have pain or swelling around your face or eyes.  You have vision problems.  You develop confusion.  Your neck is stiff.  You have trouble breathing. This information is not intended to replace advice given to you by your health care provider. Make sure you discuss any questions you have with your health care provider. Document Released: 04/29/2005 Document Revised: 12/24/2015 Document Reviewed: 02/22/2015 Elsevier Interactive Patient Education  2017 [employer].

## 2016-06-13 NOTE — Progress Notes (Signed)
Careteam: Patient Care Team: Lauree Chandler, NP as PCP - General (Nurse Practitioner)  Advanced Directive information Does Patient Have a Medical Advance Directive?: No  Allergies  Allergen Reactions  . Ace Inhibitors   . Amoxicillin     REACTION: unknown? pain in right kidney  . Benicar Hct [Olmesartan Medoxomil-Hctz]     Extreme weakness  . Budesonide-Formoterol Fumarate     Causes tremors and numbness  . Chlorzoxazone [Chlorzoxazone]   . Citalopram Hydrobromide     REACTION: hives  . Citalopram Hydrobromide   . Clonidine Derivatives   . Droperidol     REACTION: hives  . Flexeril [Cyclobenzaprine Hcl]   . Ketorolac Tromethamine     REACTION: hives  . Ketorolac Tromethamine   . Metoclopramide Hcl   . Mometasone Furo-Formoterol Fum     Causes sore throat, blurred vision and unable to sleep--started on med 09-17-10  . Morphine     REACTION: hives and itching  . Olmesartan Medoxomil     REACTION: fatique  . Breo Ellipta [Fluticasone Furoate-Vilanterol] Itching    Pt reports itching with Memory Dance is related to being lactose intolerant.     Chief Complaint  Patient presents with  . Acute Visit    Cough/sinus congestion x 3 weeks     HPI: Patient is a 76 y.o. female seen in the office today for ongoing sinus congestion.  Also has cough. Has used mucinex DM and mucinex sinus max (which was really helpful). Did not take sinus max today.  Using flonase as well.  Using saline as needed throughout the day. (abt 3 times a day) No fevers or chills but feeling bad. Increased weakness.  No shortness of breath.  No chest congestion.   Review of Systems:  Review of Systems  Constitutional: Negative for activity change, chills and fever.  HENT: Positive for congestion (chest), ear pain (left ear), rhinorrhea, sinus pain, sinus pressure, sneezing and sore throat.   Respiratory: Positive for cough (productive). Negative for shortness of breath and wheezing.   Cardiovascular:  Negative for chest pain and palpitations.  Gastrointestinal: Negative for diarrhea and nausea.  Skin: Positive for rash.  Neurological: Positive for dizziness and headaches (occasionally). Negative for light-headedness.    Past Medical History:  Diagnosis Date  . ALLERGIC RHINITIS   . Anxiety   . Asthma   . Blood type O+   . Cervical strain, acute   . Colon polyp   . Diverticulosis of colon   . Dizziness   . Fibromyalgia   . GERD (gastroesophageal reflux disease)   . History of nephrolithiasis   . Hypercholesterolemia    borderline  . Hypertension   . IBS (irritable bowel syndrome)   . Osteoarthritis   . Personal history of allergy to unspecified medicinal agent   . Postconcussion syndrome   . Vitamin D deficiency    Past Surgical History:  Procedure Laterality Date  . CHOLECYSTECTOMY, LAPAROSCOPIC  2004   for gallstone pancreatitis  . KNEE SURGERY Right   . KNEE SURGERY Left   . THYROID SURGERY     Social History:   reports that she quit smoking about 54 years ago. Her smoking use included Cigarettes. She has a 16.00 pack-year smoking history. She has never used smokeless tobacco. She reports that she does not drink alcohol or use drugs.  Family History  Problem Relation Age of Onset  . Breast cancer Mother   . Alzheimer's disease Mother   . Heart disease Mother   .  COPD Brother   . Heart disease Sister   . Alcohol abuse Sister   . Ovarian cancer Paternal Grandmother   . Arthritis Other     Cousins   . COPD Cousin     Maternal side   . Heart attack Maternal Uncle     Medications: Patient's Medications  New Prescriptions   No medications on file  Previous Medications   ACETAMINOPHEN (TYLENOL) 500 MG TABLET    Take 500 mg by mouth every 6 (six) hours as needed (for headache as needed).    ALBUTEROL (PROVENTIL HFA;VENTOLIN HFA) 108 (90 BASE) MCG/ACT INHALER    Inhale 2 puffs into the lungs every 4 (four) hours as needed for wheezing or shortness of breath.  RESCUE   AMLODIPINE (NORVASC) 5 MG TABLET    Take 1 tablet (5 mg total) by mouth daily. for blood pressure   ASPIRIN 81 MG EC TABLET    Take 81 mg by mouth daily.     CETIRIZINE HCL (ZYRTEC ALLERGY) 10 MG CAPS    Take 1 capsule by mouth daily.     CHOLECALCIFEROL (VITAMIN D3) 1000 UNITS CAPS    Take by mouth daily.     FLUTICASONE (FLONASE) 50 MCG/ACT NASAL SPRAY    SHAKE LIQUID AND USE 1 TO 2 SPRAYS IN EACH NOSTRIL TWICE DAILY   FLUTICASONE-SALMETEROL (ADVAIR DISKUS) 250-50 MCG/DOSE AEPB    Inhale 1 puff into the lungs 2 (two) times daily.   HYDROCHLOROTHIAZIDE (MICROZIDE) 12.5 MG CAPSULE    TAKE 1 CAPSULE BY MOUTH EVERY DAY   MECLIZINE (ANTIVERT) 25 MG TABLET    TAKE 1 TABLET 3 TIMES A DAY AS NEEDED FOR DIZZINESS/NAUSEA   MULTIPLE VITAMINS-MINERALS (CENTRUM SILVER PO)    Take by mouth daily.     OMEGA-3 FATTY ACIDS (FISH OIL MAXIMUM STRENGTH) 1200 MG CAPS    Take by mouth. 2 by mouth daily   PANTOPRAZOLE (PROTONIX) 40 MG TABLET    Take 1 tablet (40 mg total) by mouth daily. Before breakfast   POTASSIUM CHLORIDE SA (K-DUR,KLOR-CON) 20 MEQ TABLET    Take 1 tablet (20 mEq total) by mouth daily.   PROPYLENE GLYCOL (SYSTANE BALANCE) 0.6 % SOLN    Place 2 drops into both eyes 2 (two) times daily.   SALINE NASAL SPRAY NA    Place 88 mcg into the nose 2 (two) times daily.  Modified Medications   No medications on file  Discontinued Medications   No medications on file     Physical Exam:  Vitals:   06/13/16 1421  BP: 128/82  Pulse: 90  Resp: 18  Temp: 98.3 F (36.8 C)  TempSrc: Oral  SpO2: 96%  Weight: 184 lb 12.8 oz (83.8 kg)  Height: 5' 4"  (1.626 m)   Body mass index is 31.72 kg/m.  Physical Exam  Constitutional: She is oriented to person, place, and time. She appears well-developed and well-nourished. No distress.  HENT:  Head: Normocephalic and atraumatic.  Right Ear: Tympanic membrane, external ear and ear canal normal.  Left Ear: Tympanic membrane, external ear and ear  canal normal.  Nose: Mucosal edema and rhinorrhea present.  Mouth/Throat: Oropharynx is clear and moist. No oropharyngeal exudate.  Eyes: Pupils are equal, round, and reactive to light.  Neck: Normal range of motion. Neck supple. No thyromegaly present.  Cardiovascular: Normal rate, regular rhythm and normal heart sounds.   Pulmonary/Chest: Effort normal and breath sounds normal. No respiratory distress. She has no wheezes. She has no rales.  Abdominal: Soft. Bowel sounds are normal. She exhibits no distension. There is no tenderness.  Musculoskeletal: Normal range of motion.  Lymphadenopathy:    She has no cervical adenopathy.  Neurological: She is alert and oriented to person, place, and time.  Skin: Skin is warm and dry. She is not diaphoretic.  Psychiatric: She has a normal mood and affect. Her behavior is normal. Judgment and thought content normal.    Labs reviewed: Basic Metabolic Panel:  Recent Labs  08/08/15 1519 11/10/15 0903 02/27/16 0911  NA 140 139 141  K 3.6 4.0 4.2  CL 102 98 100  CO2  --  25 29  GLUCOSE 94 126* 117*  BUN 22* 20 22  CREATININE 0.70 0.74 0.79  CALCIUM  --  9.6 9.7   Liver Function Tests:  Recent Labs  11/10/15 0903  AST 28  ALT 24  ALKPHOS 90  BILITOT 0.6  PROT 7.0  ALBUMIN 4.5   No results for input(s): LIPASE, AMYLASE in the last 8760 hours. No results for input(s): AMMONIA in the last 8760 hours. CBC:  Recent Labs  08/08/15 1519 02/27/16 0911  WBC  --  8.6  NEUTROABS  --  4,300  HGB 15.0 14.5  HCT 44.0 42.7  MCV  --  84.9  PLT  --  316   Lipid Panel:  Recent Labs  11/10/15 0903  CHOL 217*  HDL 44  LDLCALC 133*  TRIG 200*  CHOLHDL 4.9*   TSH: No results for input(s): TSH in the last 8760 hours. A1C: Lab Results  Component Value Date   HGBA1C 5.6 11/23/2015     Assessment/Plan 1. Acute non-recurrent pansinusitis -recurrent symptoms for greater than 3 weeks and feeling worse - doxycycline (VIBRA-TABS)  100 MG tablet; Take 1 tablet (100 mg total) by mouth 2 (two) times daily.  Dispense: 20 tablet; Refill: 0 -advised again decongestants. - Cont to use Flonase scheduled and saline spray as needed   -no chest congestion, may use delsym as needed for cough  -follow up precautions given.  Carlos American. Harle Battiest  [facility] [phone] 8 am - 5 pm) [phone] (after hours)

## 2016-06-26 ENCOUNTER — Encounter: Payer: Self-pay | Admitting: Internal Medicine

## 2016-06-26 ENCOUNTER — Ambulatory Visit (INDEPENDENT_AMBULATORY_CARE_PROVIDER_SITE_OTHER): Payer: PPO | Admitting: Internal Medicine

## 2016-06-26 VITALS — BP 136/80 | HR 90 | Temp 97.9°F | Resp 14 | Ht 64.0 in | Wt 186.0 lb

## 2016-06-26 DIAGNOSIS — Z6831 Body mass index (BMI) 31.0-31.9, adult: Secondary | ICD-10-CM

## 2016-06-26 DIAGNOSIS — M545 Low back pain, unspecified: Secondary | ICD-10-CM

## 2016-06-26 DIAGNOSIS — I10 Essential (primary) hypertension: Secondary | ICD-10-CM

## 2016-06-26 DIAGNOSIS — G8929 Other chronic pain: Secondary | ICD-10-CM | POA: Diagnosis not present

## 2016-06-26 DIAGNOSIS — J3089 Other allergic rhinitis: Secondary | ICD-10-CM | POA: Diagnosis not present

## 2016-06-26 DIAGNOSIS — J302 Other seasonal allergic rhinitis: Secondary | ICD-10-CM

## 2016-06-26 DIAGNOSIS — E669 Obesity, unspecified: Secondary | ICD-10-CM | POA: Insufficient documentation

## 2016-06-26 DIAGNOSIS — E6609 Other obesity due to excess calories: Secondary | ICD-10-CM

## 2016-06-26 DIAGNOSIS — R739 Hyperglycemia, unspecified: Secondary | ICD-10-CM

## 2016-06-26 DIAGNOSIS — J45909 Unspecified asthma, uncomplicated: Secondary | ICD-10-CM

## 2016-06-26 DIAGNOSIS — R350 Frequency of micturition: Secondary | ICD-10-CM | POA: Diagnosis not present

## 2016-06-26 MED ORDER — HYDROCODONE-HOMATROPINE 5-1.5 MG/5ML PO SYRP: 5.0000 mL | ORAL_SOLUTION | Freq: Three times a day (TID) | ORAL | 0 refills | Status: DC | PRN

## 2016-06-26 MED ORDER — AZITHROMYCIN 250 MG PO TABS: ORAL_TABLET | ORAL | 0 refills | Status: DC

## 2016-06-26 NOTE — Addendum Note (Signed)
Addended by: Logan Bores on: 06/26/2016 11:12 AM   Modules accepted: Orders
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
## 2016-06-27 DIAGNOSIS — H524 Presbyopia: Secondary | ICD-10-CM | POA: Insufficient documentation

## 2016-06-27 DIAGNOSIS — Z961 Presence of intraocular lens: Secondary | ICD-10-CM | POA: Insufficient documentation

## 2016-06-27 LAB — URINALYSIS, ROUTINE W REFLEX MICROSCOPIC
BILIRUBIN URINE: NEGATIVE
GLUCOSE, UA: NEGATIVE
Hgb urine dipstick: NEGATIVE
Ketones, ur: NEGATIVE
Leukocytes, UA: NEGATIVE
Nitrite: NEGATIVE
PH: 5.5 (ref 5.0–8.0)
PROTEIN: NEGATIVE
Specific Gravity, Urine: 1.01 (ref 1.001–1.035)

## 2016-06-28 LAB — URINE CULTURE

## 2016-07-01 ENCOUNTER — Other Ambulatory Visit: Payer: Medicare Other

## 2016-07-02 ENCOUNTER — Other Ambulatory Visit: Payer: PPO

## 2016-07-02 ENCOUNTER — Ambulatory Visit (INDEPENDENT_AMBULATORY_CARE_PROVIDER_SITE_OTHER): Payer: PPO

## 2016-07-02 VITALS — BP 140/88 | HR 87 | Temp 98.2°F | Ht 64.0 in | Wt 186.0 lb

## 2016-07-02 DIAGNOSIS — E785 Hyperlipidemia, unspecified: Secondary | ICD-10-CM | POA: Diagnosis not present

## 2016-07-02 DIAGNOSIS — Z0000 Encounter for general adult medical examination without abnormal findings: Secondary | ICD-10-CM

## 2016-07-02 DIAGNOSIS — R739 Hyperglycemia, unspecified: Secondary | ICD-10-CM | POA: Diagnosis not present

## 2016-07-02 LAB — COMPLETE METABOLIC PANEL WITH GFR
ALT: 25 U/L (ref 6–29)
AST: 28 U/L (ref 10–35)
Albumin: 4.3 g/dL (ref 3.6–5.1)
Alkaline Phosphatase: 79 U/L (ref 33–130)
BUN: 22 mg/dL (ref 7–25)
CHLORIDE: 104 mmol/L (ref 98–110)
CO2: 23 mmol/L (ref 20–31)
CREATININE: 0.87 mg/dL (ref 0.60–0.93)
Calcium: 9.7 mg/dL (ref 8.6–10.4)
GFR, Est African American: 74 mL/min (ref >=60)
GFR, Est Non African American: 64 mL/min (ref >=60)
GLUCOSE: 128 mg/dL — AB (ref 65–99)
Potassium: 3.8 mmol/L (ref 3.5–5.3)
Sodium: 140 mmol/L (ref 135–146)
TOTAL PROTEIN: 7 g/dL (ref 6.1–8.1)
Total Bilirubin: 0.5 mg/dL (ref 0.2–1.2)

## 2016-07-02 LAB — LIPID PANEL
CHOL/HDL RATIO: 4.6 ratio (ref <5.0)
Cholesterol: 214 mg/dL — ABNORMAL HIGH (ref <200)
HDL: 47 mg/dL — ABNORMAL LOW (ref >50)
LDL CALC: 136 mg/dL — AB (ref <100)
TRIGLYCERIDES: 155 mg/dL — AB (ref <150)
VLDL: 31 mg/dL — AB (ref <30)

## 2016-07-02 NOTE — Patient Instructions (Addendum)
[name] , Thank you for taking time to come for your Medicare Wellness Visit. I appreciate your ongoing commitment to your health goals. Please review the following plan we discussed and let me know if I can assist you in the future.   These are the goals we discussed: Goals    . LDL CALC < 130    . Weight (lb) < 140 lb (63.5 kg)          Starting 07/02/16, I will attempt to decrease my weight to reach my goal weight of 140 lbs.        This is a list of the screening recommended for you and due dates:  Health Maintenance  Topic Date Due  . Colon Cancer Screening  05/13/2018*  . Tetanus Vaccine  12/07/2023  . Flu Shot  Completed  . DEXA scan (bone density measurement)  Completed  . Pneumonia vaccines  Completed  *Topic was postponed. The date shown is not the original due date.  Preventive Care for Adults  A healthy lifestyle and preventive care can promote health and wellness. Preventive health guidelines for adults include the following key practices.  . A routine yearly physical is a good way to check with your health care provider about your health and preventive screening. It is a chance to share any concerns and updates on your health and to receive a thorough exam.  . Visit your dentist for a routine exam and preventive care every 6 months. Brush your teeth twice a day and floss once a day. Good oral hygiene prevents tooth decay and gum disease.  . The frequency of eye exams is based on your age, health, family medical history, use  of contact lenses, and other factors. Follow your health care provider's ecommendations for frequency of eye exams.  . Eat a healthy diet. Foods like vegetables, fruits, whole grains, low-fat dairy products, and lean protein foods contain the nutrients you need without too many calories. Decrease your intake of foods high in solid fats, added sugars, and salt. Eat the right amount of calories for you. Get information about a proper diet from  your health care provider, if necessary.  . Regular physical exercise is one of the most important things you can do for your health. Most adults should get at least 150 minutes of moderate-intensity exercise (any activity that increases your heart rate and causes you to sweat) each week. In addition, most adults need muscle-strengthening exercises on 2 or more days a week.  Silver Sneakers may be a benefit available to you. To determine eligibility, you may visit the website: www.silversneakers.com or contact program at 2510638307 Mon-Fri between 8AM-8PM.   . Maintain a healthy weight. The body mass index (BMI) is a screening tool to identify possible weight problems. It provides an estimate of body fat based on height and weight. Your health care provider can find your BMI and can help you achieve or maintain a healthy weight.   For adults 20 years and older: ? A BMI below 18.5 is considered underweight. ? A BMI of 18.5 to 24.9 is normal. ? A BMI of 25 to 29.9 is considered overweight. ? A BMI of 30 and above is considered obese.   . Maintain normal blood lipids and cholesterol levels by exercising and minimizing your intake of saturated fat. Eat a balanced diet with plenty of fruit and vegetables. Blood tests for lipids and cholesterol should begin at age 12 and be repeated every 5 years. If  your lipid or cholesterol levels are high, you are over 50, or you are at high risk for heart disease, you may need your cholesterol levels checked more frequently. Ongoing high lipid and cholesterol levels should be treated with medicines if diet and exercise are not working.  . If you smoke, find out from your health care provider how to quit. If you do not use tobacco, please do not start.  . If you choose to drink alcohol, please do not consume more than 2 drinks per day. One drink is considered to be 12 ounces (355 mL) of beer, 5 ounces (148 mL) of wine, or 1.5 ounces (44 mL) of liquor.  . If you  are 53-29 years old, ask your health care provider if you should take aspirin to prevent strokes.  . Use sunscreen. Apply sunscreen liberally and repeatedly throughout the day. You should seek shade when your shadow is shorter than you. Protect yourself by wearing long sleeves, pants, a wide-brimmed hat, and sunglasses year round, whenever you are outdoors.  . Once a month, do a whole body skin exam, using a mirror to look at the skin on your back. Tell your health care provider of new moles, moles that have irregular borders, moles that are larger than a pencil eraser, or moles that have changed in shape or color.

## 2016-07-02 NOTE — Progress Notes (Signed)
Subjective:   [name] is a 76 y.o. female who presents for Medicare Annual (Subsequent) preventive examination.  Review of Systems:  Cardiac Risk Factors include: advanced age (>28mn, >>82women);dyslipidemia;hypertension;family history of premature cardiovascular disease;smoking/ tobacco exposure;sedentary lifestyle;obesity (BMI >30kg/m2)     Objective:     Vitals: BP 140/88 (BP Location: Left Arm, Patient Position: Sitting, Cuff Size: Normal)   Pulse 87   Temp 98.2 F (36.8 C) (Oral)   Ht 5' 4"  (1.626 m)   Wt 186 lb (84.4 kg)   SpO2 95%   BMI 31.93 kg/m   Body mass index is 31.93 kg/m.   Tobacco History  Smoking Status  . Former Smoker  . Packs/day: 2.00  . Years: 8.00  . Types: Cigarettes  . Quit date: 05/13/1962  Smokeless Tobacco  . Never Used     Counseling given: No   Past Medical History:  Diagnosis Date  . ALLERGIC RHINITIS   . Anxiety   . Asthma   . Blood type O+   . Cervical strain, acute   . Colon polyp   . Diverticulosis of colon   . Dizziness   . Fibromyalgia   . GERD (gastroesophageal reflux disease)   . History of nephrolithiasis   . Hypercholesterolemia    borderline  . Hypertension   . IBS (irritable bowel syndrome)   . Osteoarthritis   . Personal history of allergy to unspecified medicinal agent   . Postconcussion syndrome   . Vitamin D deficiency    Past Surgical History:  Procedure Laterality Date  . CHOLECYSTECTOMY, LAPAROSCOPIC  2004   for gallstone pancreatitis  . KNEE SURGERY Right   . KNEE SURGERY Left   . THYROID SURGERY     Family History  Problem Relation Age of Onset  . Breast cancer Mother   . Alzheimer's disease Mother   . Heart disease Mother   . COPD Brother   . Heart disease Sister   . Breast cancer Sister   . Alcohol abuse Sister   . Ovarian cancer Paternal Grandmother   . Arthritis Other     Cousins   . COPD Cousin     Maternal side   . Heart attack Maternal Uncle    History  Sexual  Activity  . Sexual activity: Not Currently  . Birth control/ protection: Post-menopausal    Outpatient Encounter Prescriptions as of 07/02/2016  Medication Sig  . acetaminophen (TYLENOL) 500 MG tablet Take 500 mg by mouth every 6 (six) hours as needed (for headache as needed).   .Marland Kitchenalbuterol (PROVENTIL HFA;VENTOLIN HFA) 108 (90 Base) MCG/ACT inhaler Inhale 2 puffs into the lungs every 4 (four) hours as needed for wheezing or shortness of breath. RESCUE  . amLODipine (NORVASC) 5 MG tablet Take 1 tablet (5 mg total) by mouth daily. for blood pressure  . aspirin 81 MG EC tablet Take 81 mg by mouth daily.    . Cetirizine HCl (ZYRTEC ALLERGY) 10 MG CAPS Take 1 capsule by mouth daily.    . Cholecalciferol (VITAMIN D3) 1000 UNITS CAPS Take by mouth daily.    . fluticasone (FLONASE) 50 MCG/ACT nasal spray SHAKE LIQUID AND USE 1 TO 2 SPRAYS IN EACH NOSTRIL TWICE DAILY  . Fluticasone-Salmeterol (ADVAIR DISKUS) 250-50 MCG/DOSE AEPB Inhale 1 puff into the lungs 2 (two) times daily.  . hydrochlorothiazide (MICROZIDE) 12.5 MG capsule TAKE 1 CAPSULE BY MOUTH EVERY DAY  . meclizine (ANTIVERT) 25 MG tablet TAKE 1 TABLET 3 TIMES A DAY  AS NEEDED FOR DIZZINESS/NAUSEA  . Multiple Vitamins-Minerals (CENTRUM SILVER PO) Take by mouth daily.    . Omega-3 Fatty Acids (FISH OIL MAXIMUM STRENGTH) 1200 MG CAPS Take by mouth. 2 by mouth daily  . pantoprazole (PROTONIX) 40 MG tablet Take 1 tablet (40 mg total) by mouth daily. Before breakfast  . potassium chloride SA (K-DUR,KLOR-CON) 20 MEQ tablet Take 1 tablet (20 mEq total) by mouth daily.  Marland Kitchen Propylene Glycol (SYSTANE BALANCE) 0.6 % SOLN Apply 1 drop to eye 2 (two) times daily.  Marland Kitchen SALINE NASAL SPRAY NA Place 88 mcg into the nose 2 (two) times daily.  . [DISCONTINUED] HYDROcodone-homatropine (HYCODAN) 5-1.5 MG/5ML syrup Take 5 mLs by mouth every 8 (eight) hours as needed for cough.  . [DISCONTINUED] azithromycin (ZITHROMAX) 250 MG tablet 2 initially, then 1 daily for  infection   No facility-administered encounter medications on file as of 07/02/2016.     Activities of Daily Living In your present state of health, do you have any difficulty performing the following activities: 07/02/2016  Hearing? N  Vision? N  Difficulty concentrating or making decisions? Y  Walking or climbing stairs? N  Dressing or bathing? N  Doing errands, shopping? N  Preparing Food and eating ? N  Using the Toilet? N  In the past six months, have you accidently leaked urine? N  Do you have problems with loss of bowel control? N  Managing your Medications? N  Managing your Finances? N  Housekeeping or managing your Housekeeping? N  Some recent data might be hidden    Patient Care Team: Lauree Chandler, NP as PCP - General (Nurse Practitioner)    Assessment:     Exercise Activities and Dietary recommendations Current Exercise Habits: The patient does not participate in regular exercise at present, Exercise limited by: None identified  Goals    . LDL CALC < 130    . Weight (lb) < 140 lb (63.5 kg)          Starting 07/02/16, I will attempt to decrease my weight to reach my goal weight of 140 lbs.       Fall Risk Fall Risk  07/02/2016 06/26/2016 06/13/2016 05/23/2016 05/02/2016  Falls in the past year? No No No No No  Number falls in past yr: - - - - -  Injury with Fall? - - - - -  Follow up - - - - -   Depression Screen PHQ 2/9 Scores 07/02/2016 11/07/2015 10/23/2015 04/04/2015  PHQ - 2 Score 0 0 0 0     Cognitive Function MMSE - Mini Mental State Exam 07/02/2016 04/04/2015 09/22/2013  Not completed: - (No Data) -  Orientation to time 5 5 5   Orientation to Place 4 5 5   Registration 3 3 3   Attention/ Calculation 5 5 5   Recall 3 2 2   Language- name 2 objects 2 2 2   Language- repeat 1 1 1   Language- follow 3 step command 3 3 3   Language- read & follow direction 1 1 1   Write a sentence 1 1 1   Copy design 1 1 1   Total score 29 29 29         Immunization  History  Administered Date(s) Administered  . Influenza Split 02/11/2011, 02/25/2012  . Influenza Whole 02/10/2009, 02/26/2010  . Influenza,inj,Quad PF,36+ Mos 02/09/2014, 03/20/2015, 02/27/2016  . Influenza-Unspecified 03/13/2013  . Pneumococcal Conjugate-13 02/27/2016  . Pneumococcal Polysaccharide-23 08/25/2014  . Tdap 12/06/2013   Screening Tests Health Maintenance  Topic Date Due  .  COLONOSCOPY  05/13/2018 (Originally 07/28/2013)  . TETANUS/TDAP  12/07/2023  . INFLUENZA VACCINE  Completed  . DEXA SCAN  Completed  . PNA vac Low Risk Adult  Completed      Plan:    I have personally reviewed and addressed the Medicare Annual Wellness questionnaire and have noted the following in the patient's chart:  A. Medical and social history B. Use of alcohol, tobacco or illicit drugs  C. Current medications and supplements D. Functional ability and status E.  Nutritional status F.  Physical activity G. Advance directives H. List of other physicians I.  Hospitalizations, surgeries, and ER visits in previous 12 months J.  [place] to include hearing, vision, cognitive, depression L. Referrals and appointments - none  In addition, I have reviewed and discussed with patient certain preventive protocols, quality metrics, and best practice recommendations. A written personalized care plan for preventive services as well as general preventive health recommendations were provided to patient.  See attached scanned questionnaire for additional information.   Signed,   Allyn Kenner, LPN Health Advisor  I reviewed health advisors note, agree with documentation and plan.  Carlos American. Harle Battiest  [facility] [phone] 8 am - 5 pm) [phone] (after hours)

## 2016-07-02 NOTE — Progress Notes (Signed)
Quick Notes   Health Maintenance:  None    Abnormal Screen: MMSE-29/30 Passed Clock Test    Patient Concerns:  Pt has been sick x 2 mos. Ha, cough, sinus drainage etc.     Nurse Concerns:  None

## 2016-07-03 LAB — HEMOGLOBIN A1C
Hgb A1c MFr Bld: 5.7 % — ABNORMAL HIGH (ref <5.7)
Mean Plasma Glucose: 117 mg/dL

## 2016-07-04 ENCOUNTER — Ambulatory Visit: Payer: Medicare Other

## 2016-07-04 ENCOUNTER — Ambulatory Visit (INDEPENDENT_AMBULATORY_CARE_PROVIDER_SITE_OTHER): Payer: PPO | Admitting: Nurse Practitioner

## 2016-07-04 ENCOUNTER — Encounter: Payer: Self-pay | Admitting: Nurse Practitioner

## 2016-07-04 VITALS — BP 128/78 | HR 92 | Temp 97.8°F | Resp 17 | Ht 64.0 in | Wt 186.8 lb

## 2016-07-04 DIAGNOSIS — K219 Gastro-esophageal reflux disease without esophagitis: Secondary | ICD-10-CM

## 2016-07-04 DIAGNOSIS — M545 Low back pain, unspecified: Secondary | ICD-10-CM

## 2016-07-04 DIAGNOSIS — E782 Mixed hyperlipidemia: Secondary | ICD-10-CM

## 2016-07-04 DIAGNOSIS — I10 Essential (primary) hypertension: Secondary | ICD-10-CM | POA: Diagnosis not present

## 2016-07-04 DIAGNOSIS — E6609 Other obesity due to excess calories: Secondary | ICD-10-CM

## 2016-07-04 DIAGNOSIS — J45909 Unspecified asthma, uncomplicated: Secondary | ICD-10-CM

## 2016-07-04 DIAGNOSIS — J4520 Mild intermittent asthma, uncomplicated: Secondary | ICD-10-CM | POA: Diagnosis not present

## 2016-07-04 DIAGNOSIS — J324 Chronic pansinusitis: Secondary | ICD-10-CM

## 2016-07-04 DIAGNOSIS — R739 Hyperglycemia, unspecified: Secondary | ICD-10-CM

## 2016-07-04 DIAGNOSIS — Z6831 Body mass index (BMI) 31.0-31.9, adult: Secondary | ICD-10-CM | POA: Diagnosis not present

## 2016-07-04 DIAGNOSIS — G8929 Other chronic pain: Secondary | ICD-10-CM

## 2016-07-04 MED ORDER — ESOMEPRAZOLE MAGNESIUM 40 MG PO CPDR: 40.0000 mg | DELAYED_RELEASE_CAPSULE | Freq: Every day | ORAL | 1 refills | Status: DC

## 2016-07-04 NOTE — Progress Notes (Signed)
Careteam: Patient Care Team: Lauree Chandler, NP as PCP - General (Nurse Practitioner)  Advanced Directive information Does Patient Have a Medical Advance Directive?: No, Would patient like information on creating a medical advance directive?: Yes (MAU/Ambulatory/Procedural Areas - Information given)  Allergies  Allergen Reactions  . Ace Inhibitors   . Amoxicillin     REACTION: unknown? pain in right kidney  . Benicar Hct [Olmesartan Medoxomil-Hctz]     Extreme weakness  . Budesonide-Formoterol Fumarate     Causes tremors and numbness  . Chlorzoxazone [Chlorzoxazone]   . Citalopram Hydrobromide     REACTION: hives  . Citalopram Hydrobromide   . Clonidine Derivatives   . Droperidol     REACTION: hives  . Flexeril [Cyclobenzaprine Hcl]   . Ketorolac Tromethamine     REACTION: hives  . Ketorolac Tromethamine   . Metoclopramide Hcl   . Mometasone Furo-Formoterol Fum     Causes sore throat, blurred vision and unable to sleep--started on med 09-17-10  . Morphine     REACTION: hives and itching  . Olmesartan Medoxomil     REACTION: fatique  . Breo Ellipta [Fluticasone Furoate-Vilanterol] Itching    Pt reports itching with Memory Dance is related to being lactose intolerant.     Chief Complaint  Patient presents with  . Medical Management of Chronic Issues    4 month follow up. Labs printed.      HPI: Patient is a 76 y.o. female seen in the office today for routine follow up, pt was seen by Dr Nyoka Cowden last week due to persistent cough and back pain.  Back pain thought to be due to arthritis; urine culture sent to rule out infection which was negative. She was prescribed azithromycin for bronchitis with hydrocodone cough syrup. Doing better but not as well as she like to be. Completed course of azithromycin.  conts to have hay fever with increase pressure in her sinuses and sneezing.  No longer seeing allergist.   Very inactive. Having a lot on back pain, feels like this is  related to inactivity. Knows what she needs to do, just needs to do it.   Discussed labs with pt, has made changes in diet since wellness exam last week.  Acid reflux makes it hard to her to eat fresh fruits and vegetables because it exacerbates her symptoms. Symptoms better on nexium vs protonix, taking nexium at this time.    Review of Systems:  Review of Systems  Constitutional: Negative for activity change, chills and fever.       Obese  HENT: Positive for congestion (chest), ear pain (left ear), rhinorrhea, sinus pain, sinus pressure and sneezing.        Overall symptoms have improved  Respiratory: Positive for cough (productive). Negative for shortness of breath and wheezing.   Cardiovascular: Negative for chest pain and palpitations.  Gastrointestinal: Negative for diarrhea and nausea.  Endocrine:       Hyperglycemia  Genitourinary: Positive for frequency and urgency.       Worse at night  Musculoskeletal: Positive for back pain.  Neurological: Positive for dizziness and headaches (occasionally). Negative for weakness and light-headedness.  Psychiatric/Behavioral: The patient is nervous/anxious.     Past Medical History:  Diagnosis Date  . ALLERGIC RHINITIS   . Anxiety   . Asthma   . Blood type O+   . Cervical strain, acute   . Colon polyp   . Diverticulosis of colon   . Dizziness   . Fibromyalgia   .  GERD (gastroesophageal reflux disease)   . History of nephrolithiasis   . Hypercholesterolemia    borderline  . Hypertension   . IBS (irritable bowel syndrome)   . Osteoarthritis   . Personal history of allergy to unspecified medicinal agent   . Postconcussion syndrome   . Vitamin D deficiency    Past Surgical History:  Procedure Laterality Date  . CHOLECYSTECTOMY, LAPAROSCOPIC  2004   for gallstone pancreatitis  . KNEE SURGERY Right   . KNEE SURGERY Left   . THYROID SURGERY     Social History:   reports that she quit smoking about 54 years ago. Her smoking  use included Cigarettes. She has a 16.00 pack-year smoking history. She has never used smokeless tobacco. She reports that she does not drink alcohol or use drugs.  Family History  Problem Relation Age of Onset  . Breast cancer Mother   . Alzheimer's disease Mother   . Heart disease Mother   . COPD Brother   . Heart disease Sister   . Breast cancer Sister   . Alcohol abuse Sister   . Ovarian cancer Paternal Grandmother   . Arthritis Other     Cousins   . COPD Cousin     Maternal side   . Heart attack Maternal Uncle     Medications: Patient's Medications  New Prescriptions   No medications on file  Previous Medications   ACETAMINOPHEN (TYLENOL) 500 MG TABLET    Take 500 mg by mouth every 6 (six) hours as needed (for headache as needed).    ALBUTEROL (PROVENTIL HFA;VENTOLIN HFA) 108 (90 BASE) MCG/ACT INHALER    Inhale 2 puffs into the lungs every 4 (four) hours as needed for wheezing or shortness of breath. RESCUE   AMLODIPINE (NORVASC) 5 MG TABLET    Take 1 tablet (5 mg total) by mouth daily. for blood pressure   ASPIRIN 81 MG EC TABLET    Take 81 mg by mouth daily.     CETIRIZINE HCL (ZYRTEC ALLERGY) 10 MG CAPS    Take 1 capsule by mouth daily.     CHOLECALCIFEROL (VITAMIN D3) 1000 UNITS CAPS    Take by mouth daily.     ESOMEPRAZOLE (NEXIUM) 20 MG CAPSULE    Take 40 mg by mouth daily at 12 noon.   FLUTICASONE (FLONASE) 50 MCG/ACT NASAL SPRAY    SHAKE LIQUID AND USE 1 TO 2 SPRAYS IN EACH NOSTRIL TWICE DAILY   FLUTICASONE-SALMETEROL (ADVAIR DISKUS) 250-50 MCG/DOSE AEPB    Inhale 1 puff into the lungs 2 (two) times daily.   HYDROCHLOROTHIAZIDE (MICROZIDE) 12.5 MG CAPSULE    TAKE 1 CAPSULE BY MOUTH EVERY DAY   MECLIZINE (ANTIVERT) 25 MG TABLET    TAKE 1 TABLET 3 TIMES A DAY AS NEEDED FOR DIZZINESS/NAUSEA   MULTIPLE VITAMINS-MINERALS (CENTRUM SILVER PO)    Take by mouth daily.     OMEGA-3 FATTY ACIDS (FISH OIL MAXIMUM STRENGTH) 1200 MG CAPS    Take by mouth. 2 by mouth daily    PANTOPRAZOLE (PROTONIX) 40 MG TABLET    Take 1 tablet (40 mg total) by mouth daily. Before breakfast   POTASSIUM CHLORIDE SA (K-DUR,KLOR-CON) 20 MEQ TABLET    Take 1 tablet (20 mEq total) by mouth daily.   PROPYLENE GLYCOL (SYSTANE BALANCE) 0.6 % SOLN    Apply 1 drop to eye 2 (two) times daily.   SALINE NASAL SPRAY NA    Place 88 mcg into the nose 2 (two) times daily.  Modified Medications   No medications on file  Discontinued Medications   No medications on file     Physical Exam:  Vitals:   07/04/16 0843  BP: 128/78  Pulse: 92  Resp: 17  Temp: 97.8 F (36.6 C)  TempSrc: Oral  SpO2: 95%  Weight: 186 lb 12.8 oz (84.7 kg)  Height: 5' 4"  (1.626 m)   Body mass index is 32.06 kg/m.  Physical Exam  Constitutional: She is oriented to person, place, and time. She appears well-developed and well-nourished. No distress.  HENT:  Head: Normocephalic and atraumatic.  Right Ear: Tympanic membrane, external ear and ear canal normal.  Left Ear: Tympanic membrane, external ear and ear canal normal.  Mouth/Throat: Oropharynx is clear and moist. No oropharyngeal exudate.  Eyes: Pupils are equal, round, and reactive to light.  Neck: Normal range of motion. Neck supple. No thyromegaly present.  Cardiovascular: Normal rate, regular rhythm and normal heart sounds.   Pulmonary/Chest: Effort normal and breath sounds normal. No respiratory distress. She has no wheezes. She has no rales.  Abdominal: Soft. Bowel sounds are normal. She exhibits no distension. There is no tenderness.  Musculoskeletal: Normal range of motion.  Lymphadenopathy:    She has no cervical adenopathy.  Neurological: She is alert and oriented to person, place, and time.  Skin: Skin is warm and dry. She is not diaphoretic.  Psychiatric: She has a normal mood and affect. Her behavior is normal. Judgment and thought content normal.    Labs reviewed: Basic Metabolic Panel:  Recent Labs  11/10/15 0903 02/27/16 0911  07/02/16 0834  NA 139 141 140  K 4.0 4.2 3.8  CL 98 100 104  CO2 25 29 23   GLUCOSE 126* 117* 128*  BUN 20 22 22   CREATININE 0.74 0.79 0.87  CALCIUM 9.6 9.7 9.7   Liver Function Tests:  Recent Labs  11/10/15 0903 07/02/16 0834  AST 28 28  ALT 24 25  ALKPHOS 90 79  BILITOT 0.6 0.5  PROT 7.0 7.0  ALBUMIN 4.5 4.3   No results for input(s): LIPASE, AMYLASE in the last 8760 hours. No results for input(s): AMMONIA in the last 8760 hours. CBC:  Recent Labs  08/08/15 1519 02/27/16 0911  WBC  --  8.6  NEUTROABS  --  4,300  HGB 15.0 14.5  HCT 44.0 42.7  MCV  --  84.9  PLT  --  316   Lipid Panel:  Recent Labs  11/10/15 0903 07/02/16 0834  CHOL 217* 214*  HDL 44 47*  LDLCALC 133* 136*  TRIG 200* 155*  CHOLHDL 4.9* 4.6   TSH: No results for input(s): TSH in the last 8760 hours. A1C: Lab Results  Component Value Date   HGBA1C 5.7 (H) 07/02/2016     Assessment/Plan 1. Chronic pansinusitis -ongoing sinus congestion that has been recurrent and frequent. Encourage Netti pot twice daily.  - Ambulatory referral to ENT for further evaluation and treatment.   2. Asthmatic bronchitis without complication, unspecified asthma severity, unspecified whether persistent -improved symptoms, has completed azithromycin, cont supportive care and notify if symptoms worsen or fail to improve.   3. Hyperglycemia -encouraged dietary changes and increase activity as tolerates. Pt reports she does stress eat a lot and aware she needs to change diet.  - Hemoglobin A1c; Future  4. Class 1 obesity due to excess calories with serious comorbidity and body mass index (BMI) of 31.0 to 31.9 in adult -discussed lifestyle modifications  5. Essential hypertension Stable on current  regimen  - CMP with eGFR; Future - CBC with Differential/Platelets; Future  6. Asthma, allergic, mild intermittent, uncomplicated Felt like she is more short of breath at baseline and with any activity, was  following with pulmonary but has not seen in a while.  Pt currently on advair and albuterol.  To follow up with pulmonary.   7. Chronic midline low back pain without sciatica -ongoing pain, reports she is attempting to lose weight, also knows exercise she is supposed to be doing but has not done them.  aspercream is effective as well as tylenol. To cont asper cream and tylenol.   8. Mixed hyperlipidemia -pt reports she is willing to make diet changes, will recheck lipids in 6 months - Lipid panel; Future  9. Gastroesophageal reflux disease without esophagitis - esomeprazole (NEXIUM) 40 MG capsule; Take 1 capsule (40 mg total) by mouth daily.  Dispense: 90 capsule; Refill: 1   Khalani Novoa K. Harle Battiest  [facility] [phone] 8 am - 5 pm) [phone] (after hours)

## 2016-07-04 NOTE — Patient Instructions (Addendum)
Referral placed for ENT Do nasal wash twice daily--- nettipot Recommend follow up with PULMONARY doctors Will start nexium 40 mg daily for acid reflux Follow up in 6 months for routine follow up, sooner if needed Weight loss recommended and slowly increase physical activity (as you can tolerate)   DASH Eating Plan DASH stands for "Dietary Approaches to Stop Hypertension." The DASH eating plan is a healthy eating plan that has been shown to reduce high blood pressure (hypertension). Additional health benefits may include reducing the risk of type 2 diabetes mellitus, heart disease, and stroke. The DASH eating plan may also help with weight loss. What do I need to know about the DASH eating plan? For the DASH eating plan, you will follow these general guidelines:  Choose foods with less than 150 milligrams of sodium per serving (as listed on the food label).  Use salt-free seasonings or herbs instead of table salt or sea salt.  Check with your health care provider or pharmacist before using salt substitutes.  Eat lower-sodium products. These are often labeled as "low-sodium" or "no salt added."  Eat fresh foods. Avoid eating a lot of canned foods.  Eat more vegetables, fruits, and low-fat dairy products.  Choose whole grains. Look for the word "whole" as the first word in the ingredient list.  Choose fish and skinless chicken or [place] more often than red meat. Limit fish, poultry, and meat to 6 oz (170 g) each day.  Limit sweets, desserts, sugars, and sugary drinks.  Choose heart-healthy fats.  Eat more home-cooked food and less restaurant, buffet, and fast food.  Limit fried foods.  Do not fry foods. Cook foods using methods such as baking, boiling, grilling, and broiling instead.  When eating at a restaurant, ask that your food be prepared with less salt, or no salt if possible. What foods can I eat? Seek help from a dietitian for individual calorie needs. Grains  Whole  grain or whole wheat bread. Brown rice. Whole grain or whole wheat pasta. Quinoa, bulgur, and whole grain cereals. Low-sodium cereals. Corn or whole wheat flour tortillas. Whole grain cornbread. Whole grain crackers. Low-sodium crackers. Vegetables  Fresh or frozen vegetables (raw, steamed, roasted, or grilled). Low-sodium or reduced-sodium tomato and vegetable juices. Low-sodium or reduced-sodium tomato sauce and paste. Low-sodium or reduced-sodium canned vegetables. Fruits  All fresh, canned (in natural juice), or frozen fruits. Meat and Other Protein Products  Ground beef (85% or leaner), grass-fed beef, or beef trimmed of fat. Skinless chicken or [place]. Ground chicken or [place]. Pork trimmed of fat. All fish and seafood. Eggs. Dried beans, peas, or lentils. Unsalted nuts and seeds. Unsalted canned beans. Dairy  Low-fat dairy products, such as skim or 1% milk, 2% or reduced-fat cheeses, low-fat ricotta or cottage cheese, or plain low-fat yogurt. Low-sodium or reduced-sodium cheeses. Fats and Oils  Tub margarines without trans fats. Light or reduced-fat mayonnaise and salad dressings (reduced sodium). Avocado. Safflower, olive, or canola oils. Natural peanut or almond butter. Other  Unsalted popcorn and pretzels. The items listed above may not be a complete list of recommended foods or beverages. Contact your dietitian for more options.  What foods are not recommended? Grains  White bread. White pasta. White rice. Refined cornbread. Bagels and croissants. Crackers that contain trans fat. Vegetables  Creamed or fried vegetables. Vegetables in a cheese sauce. Regular canned vegetables. Regular canned tomato sauce and paste. Regular tomato and vegetable juices. Fruits  Canned fruit in light or heavy syrup. Fruit juice.  Meat and Other Protein Products  Fatty cuts of meat. Ribs, chicken wings, bacon, sausage, bologna, salami, chitterlings, fatback, hot dogs, bratwurst, and packaged luncheon  meats. Salted nuts and seeds. Canned beans with salt. Dairy  Whole or 2% milk, cream, half-and-half, and cream cheese. Whole-fat or sweetened yogurt. Full-fat cheeses or blue cheese. Nondairy creamers and whipped toppings. Processed cheese, cheese spreads, or cheese curds. Condiments  Onion and garlic salt, seasoned salt, table salt, and sea salt. Canned and packaged gravies. Worcestershire sauce. Tartar sauce. Barbecue sauce. Teriyaki sauce. Soy sauce, including reduced sodium. Steak sauce. Fish sauce. Oyster sauce. Cocktail sauce. Horseradish. Ketchup and mustard. Meat flavorings and tenderizers. Bouillon cubes. Hot sauce. Tabasco sauce. Marinades. Taco seasonings. Relishes. Fats and Oils  Butter, stick margarine, lard, shortening, ghee, and bacon fat. Coconut, palm kernel, or palm oils. Regular salad dressings. Other  Pickles and olives. Salted popcorn and pretzels. The items listed above may not be a complete list of foods and beverages to avoid. Contact your dietitian for more information.  Where can I find more information? National Heart, Lung, and Blood Institute: travelstabloid.com This information is not intended to replace advice given to you by your health care provider. Make sure you discuss any questions you have with your health care provider. Document Released: 04/18/2011 Document Revised: 10/05/2015 Document Reviewed: 03/03/2013 Elsevier Interactive Patient Education  2017 [employer].

## 2016-07-05 ENCOUNTER — Other Ambulatory Visit: Payer: Self-pay | Admitting: Nurse Practitioner

## 2016-07-05 ENCOUNTER — Telehealth: Payer: Self-pay | Admitting: Pulmonary Disease

## 2016-07-05 DIAGNOSIS — J329 Chronic sinusitis, unspecified: Secondary | ICD-10-CM

## 2016-07-05 NOTE — Telephone Encounter (Signed)
SN please advise if you are willing to see pt for her pulmonary issues.  thanks

## 2016-07-05 NOTE — Telephone Encounter (Signed)
Per SN---  He would be very happy to see her again.  He is very familiar with her care in the past.  appt scheduled per pts request after 3-14.  appt for 3/19 at 11:30

## 2016-07-06 ENCOUNTER — Emergency Department (HOSPITAL_COMMUNITY): Payer: PPO

## 2016-07-06 ENCOUNTER — Emergency Department (HOSPITAL_COMMUNITY)
Admission: EM | Admit: 2016-07-06 | Discharge: 2016-07-06 | Disposition: A | Discharge status: Home / Self Care | Payer: PPO | Attending: Emergency Medicine | Admitting: Emergency Medicine

## 2016-07-06 ENCOUNTER — Encounter (HOSPITAL_COMMUNITY): Payer: Self-pay | Admitting: Emergency Medicine

## 2016-07-06 DIAGNOSIS — R1310 Dysphagia, unspecified: Secondary | ICD-10-CM | POA: Insufficient documentation

## 2016-07-06 DIAGNOSIS — I10 Essential (primary) hypertension: Secondary | ICD-10-CM | POA: Diagnosis not present

## 2016-07-06 DIAGNOSIS — J45909 Unspecified asthma, uncomplicated: Secondary | ICD-10-CM | POA: Diagnosis not present

## 2016-07-06 DIAGNOSIS — Z7982 Long term (current) use of aspirin: Secondary | ICD-10-CM | POA: Insufficient documentation

## 2016-07-06 DIAGNOSIS — Z79899 Other long term (current) drug therapy: Secondary | ICD-10-CM | POA: Diagnosis not present

## 2016-07-06 DIAGNOSIS — J029 Acute pharyngitis, unspecified: Secondary | ICD-10-CM | POA: Diagnosis not present

## 2016-07-06 DIAGNOSIS — Z87891 Personal history of nicotine dependence: Secondary | ICD-10-CM | POA: Diagnosis not present

## 2016-07-06 LAB — RAPID STREP SCREEN (MED CTR MEBANE ONLY): STREPTOCOCCUS, GROUP A SCREEN (DIRECT): NEGATIVE

## 2016-07-06 MED ORDER — ACETAMINOPHEN 325 MG PO TABS
650.0000 mg | ORAL_TABLET | Freq: Once | ORAL | Status: AC
  Administered 2016-07-06: 650 mg via ORAL
  Filled 2016-07-06: qty 2

## 2016-07-06 NOTE — ED Provider Notes (Signed)
[facility] DEPT Provider Note   CSN: [id number] Arrival date & time: 07/06/16  0254  By signing my name below, I, Dolores Hoose, attest that this documentation has been prepared under the direction and in the presence of Ripley Fraise, MD . Electronically Signed: Dolores Hoose, Scribe. 07/06/2016. 2:53 AM.  History   Chief Complaint Chief Complaint  Patient presents with  . Sore Throat   The history is provided by the patient. No language interpreter was used.  Sore Throat  This is a new problem. The current episode started 3 to 5 hours ago. The problem occurs constantly. The problem has not changed since onset.Pertinent negatives include no chest pain and no shortness of breath. The symptoms are aggravated by swallowing. Nothing relieves the symptoms. Treatments tried: Warm tea. The treatment provided mild relief.    HPI Comments:  [name] is a 76 y.o. female with pmhx of GERD and IBS who presents to the Emergency Department complaining of sudden-onset, unchanged sore throat which she first noticed just before bed a few hours ago. Pt describes a sensation of a foreign body in her throat which may have scratched her throat. She reports associated cough, but notes that she suffers from hay fever and GERD and this is most likely the cause of her cough. Pt tried drinking some warm tea at home with some relief. She denies any trouble swallowing, fever, vomiting, CP or SOB.   Past Medical History:  Diagnosis Date  . ALLERGIC RHINITIS   . Anxiety   . Asthma   . Blood type O+   . Cervical strain, acute   . Colon polyp   . Diverticulosis of colon   . Dizziness   . Fibromyalgia   . GERD (gastroesophageal reflux disease)   . History of nephrolithiasis   . Hypercholesterolemia    borderline  . Hypertension   . IBS (irritable bowel syndrome)   . Osteoarthritis   . Personal history of allergy to unspecified medicinal agent   . Postconcussion syndrome   . Vitamin D  deficiency     Patient Active Problem List   Diagnosis Date Noted  . Urine frequency 06/26/2016  . Obese 06/26/2016  . Hyperglycemia 11/13/2015  . Numbness and tingling of both legs below knees 05/24/2014  . Dupuytren's contracture of both hands 01/18/2014  . Dyslipidemia 10/08/2011  . Asthmatic bronchitis 04/25/2011  . Acute sinusitis, unspecified 12/20/2010  . Back pain 07/12/2010  . NEPHROLITHIASIS 04/26/2009  . Vitamin D deficiency 10/30/2008  . COLONIC POLYPS 10/22/2007  . DIVERTICULOSIS OF COLON 10/22/2007  . Postconcussion syndrome 07/10/2007  . Seasonal and perennial allergic rhinitis 06/13/2007  . GERD 06/13/2007  . IBS 06/13/2007  . FIBROMYALGIA 06/13/2007  . PERSONAL HISTORY ALLERGY UNSPEC MEDICINAL AGENT 06/13/2007  . Anxiety state 05/04/2007  . Essential hypertension 05/04/2007  . Osteoarthritis 05/04/2007  . DIZZINESS 04/28/2007    Past Surgical History:  Procedure Laterality Date  . CHOLECYSTECTOMY, LAPAROSCOPIC  2004   for gallstone pancreatitis  . KNEE SURGERY Right   . KNEE SURGERY Left   . THYROID SURGERY      OB History    No data available       Home Medications    Prior to Admission medications   Medication Sig Start Date End Date Taking? Authorizing Provider  acetaminophen (TYLENOL) 500 MG tablet Take 500 mg by mouth every 6 (six) hours as needed (for headache as needed).     Historical Provider, MD  albuterol (PROVENTIL HFA;VENTOLIN HFA)  108 (90 Base) MCG/ACT inhaler Inhale 2 puffs into the lungs every 4 (four) hours as needed for wheezing or shortness of breath. RESCUE 05/23/16 06/29/18  Lauree Chandler, NP  amLODipine (NORVASC) 5 MG tablet Take 1 tablet (5 mg total) by mouth daily. for blood pressure 05/23/16   Lauree Chandler, NP  aspirin 81 MG EC tablet Take 81 mg by mouth daily.      Historical Provider, MD  Cetirizine HCl (ZYRTEC ALLERGY) 10 MG CAPS Take 1 capsule by mouth daily.      Historical Provider, MD  Cholecalciferol  (VITAMIN D3) 1000 UNITS CAPS Take by mouth daily.      Historical Provider, MD  esomeprazole (NEXIUM) 40 MG capsule Take 1 capsule (40 mg total) by mouth daily. 07/04/16   Lauree Chandler, NP  fluticasone (FLONASE) 50 MCG/ACT nasal spray SHAKE LIQUID AND USE 1 TO 2 SPRAYS IN EACH NOSTRIL TWICE DAILY 05/23/16   Lauree Chandler, NP  Fluticasone-Salmeterol (ADVAIR DISKUS) 250-50 MCG/DOSE AEPB Inhale 1 puff into the lungs 2 (two) times daily. 05/23/16   Lauree Chandler, NP  hydrochlorothiazide (MICROZIDE) 12.5 MG capsule TAKE 1 CAPSULE BY MOUTH EVERY DAY 05/23/16   Lauree Chandler, NP  meclizine (ANTIVERT) 25 MG tablet TAKE 1 TABLET 3 TIMES A DAY AS NEEDED FOR DIZZINESS/NAUSEA 05/23/16   Lauree Chandler, NP  Multiple Vitamins-Minerals (CENTRUM SILVER PO) Take by mouth daily.      Historical Provider, MD  Omega-3 Fatty Acids (FISH OIL MAXIMUM STRENGTH) 1200 MG CAPS Take by mouth. 2 by mouth daily    Historical Provider, MD  potassium chloride SA (K-DUR,KLOR-CON) 20 MEQ tablet Take 1 tablet (20 mEq total) by mouth daily. 05/23/16   Lauree Chandler, NP  Propylene Glycol (SYSTANE BALANCE) 0.6 % SOLN Apply 1 drop to eye 2 (two) times daily.    Historical Provider, MD  SALINE NASAL SPRAY NA Place 88 mcg into the nose 2 (two) times daily.    Historical Provider, MD    Family History Family History  Problem Relation Age of Onset  . Breast cancer Mother   . Alzheimer's disease Mother   . Heart disease Mother   . COPD Brother   . Heart disease Sister   . Breast cancer Sister   . Alcohol abuse Sister   . Ovarian cancer Paternal Grandmother   . Arthritis Other     Cousins   . COPD Cousin     Maternal side   . Heart attack Maternal Uncle     Social History Social History  Substance Use Topics  . Smoking status: Former Smoker    Packs/day: 2.00    Years: 8.00    Types: Cigarettes    Quit date: 05/13/1962  . Smokeless tobacco: Never Used  . Alcohol use No     Allergies   Ace inhibitors;  Amoxicillin; Benicar hct [olmesartan medoxomil-hctz]; Budesonide-formoterol fumarate; Chlorzoxazone [chlorzoxazone]; Citalopram hydrobromide; Citalopram hydrobromide; Clonidine derivatives; Droperidol; Flexeril [cyclobenzaprine hcl]; Ketorolac tromethamine; Ketorolac tromethamine; Metoclopramide hcl; Mometasone furo-formoterol fum; Morphine; Olmesartan medoxomil; and Breo ellipta [fluticasone furoate-vilanterol]   Review of Systems Review of Systems  Constitutional: Negative for fever.  HENT: Positive for sore throat. Negative for trouble swallowing.   Respiratory: Positive for cough. Negative for shortness of breath.   Cardiovascular: Negative for chest pain.  Gastrointestinal: Negative for vomiting.  All other systems reviewed and are negative.    Physical Exam Updated Vital Signs BP 164/65 (BP Location: Left Arm)   Pulse  83   Temp 98.1 F (36.7 C) (Oral)   Resp 18   SpO2 97%   Physical Exam CONSTITUTIONAL: Well developed/well nourished HEAD: Normocephalic/atraumatic EYES: EOMI/PERRL ENMT: Mucous membranes moist, uvula midline, mild erythema noted, normal phonation, no drooling. NECK: supple no meningeal signs SPINE/BACK:entire spine nontender CV: S1/S2 noted, no murmurs/rubs/gallops noted LUNGS: Lungs are clear to auscultation bilaterally, no apparent distress ABDOMEN: soft, nontender, no rebound or guarding, bowel sounds noted throughout abdomen GU:no cva tenderness NEURO: Pt is awake/alert/appropriate, moves all extremitiesx4.  No facial droop.   EXTREMITIES: pulses normal/equal, full ROM SKIN: warm, color normal PSYCH: no abnormalities of mood noted, alert and oriented to situation    ED Treatments / Results  DIAGNOSTIC STUDIES:  Oxygen Saturation is 97% on RA, normal by my interpretation.    COORDINATION OF CARE:  3:00 AM Discussed treatment plan with pt at bedside which includes x-ray and pt agreed to plan.  Labs (all labs ordered are listed, but only  abnormal results are displayed) Labs Reviewed  RAPID STREP SCREEN (NOT AT [facility])  CULTURE, GROUP A STREP [facility])    EKG  EKG Interpretation None       Radiology Dg Neck Soft Tissue  Result Date: 07/06/2016 CLINICAL DATA:  Sore throat EXAM: NECK SOFT TISSUES - 1+ VIEW COMPARISON:  None. FINDINGS: Straightening of the cervical spine with moderate degenerative changes at C5-C6 and C6-C7. Prevertebral soft tissue thickness is normal. Epiglottis is normal. Subglottic trachea patent. No radiopaque foreign body. Probable left carotid artery calcification. IMPRESSION: 1. No radiopaque foreign body 2. Degenerative changes of the cervical spine Electronically Signed   By: Donavan Foil M.D.   On: 07/06/2016 03:26    Procedures Procedures (including critical care time)  Medications Ordered in ED Medications - No data to display   Initial Impression / Assessment and Plan / ED Course  I have reviewed the triage vital signs and the nursing notes.  Pertinent labs & imaging results that were available during my care of the patient were reviewed by me and considered in my medical decision making (see chart for details).     Pt stable for d/c home Suspect she have irritated throat with food, no signs of foreign body or deep space infection   Final Clinical Impressions(s) / ED Diagnoses   Final diagnoses:  Odynophagia    New Prescriptions New Prescriptions   No medications on file  I personally performed the services described in this documentation, which was scribed in my presence. The recorded information has been reviewed and is accurate.      Ripley Fraise, MD 07/06/16 [phone]

## 2016-07-06 NOTE — ED Notes (Signed)
See provider's assessment.

## 2016-07-06 NOTE — ED Triage Notes (Signed)
Pt presents to ED for assessment after feeling like something might have "cut my throat".  Pt states she felt something tender before bed, but then woke up and felt like her throat was very sore and swollen.  NAD at triage.  97% on room air.  Tonsil stones and redness noted to back of throat.

## 2016-07-06 NOTE — ED Notes (Signed)
Pt verbalized understanding of d/c instructions and has no further questions. Pt is stable, A&Ox4, VSS.  

## 2016-07-08 LAB — CULTURE, GROUP A STREP (THRC)

## 2016-07-29 ENCOUNTER — Ambulatory Visit (INDEPENDENT_AMBULATORY_CARE_PROVIDER_SITE_OTHER): Payer: PPO | Admitting: Pulmonary Disease

## 2016-07-29 VITALS — BP 122/74 | HR 68 | Temp 96.8°F | Ht 64.0 in | Wt 187.0 lb

## 2016-07-29 DIAGNOSIS — E785 Hyperlipidemia, unspecified: Secondary | ICD-10-CM | POA: Diagnosis not present

## 2016-07-29 DIAGNOSIS — J3089 Other allergic rhinitis: Secondary | ICD-10-CM | POA: Diagnosis not present

## 2016-07-29 DIAGNOSIS — I10 Essential (primary) hypertension: Secondary | ICD-10-CM | POA: Diagnosis not present

## 2016-07-29 DIAGNOSIS — M159 Polyosteoarthritis, unspecified: Secondary | ICD-10-CM

## 2016-07-29 DIAGNOSIS — J302 Other seasonal allergic rhinitis: Secondary | ICD-10-CM | POA: Diagnosis not present

## 2016-07-29 DIAGNOSIS — K219 Gastro-esophageal reflux disease without esophagitis: Secondary | ICD-10-CM

## 2016-07-29 DIAGNOSIS — D126 Benign neoplasm of colon, unspecified: Secondary | ICD-10-CM

## 2016-07-29 DIAGNOSIS — M150 Primary generalized (osteo)arthritis: Secondary | ICD-10-CM | POA: Diagnosis not present

## 2016-07-29 DIAGNOSIS — F411 Generalized anxiety disorder: Secondary | ICD-10-CM

## 2016-07-29 DIAGNOSIS — J4520 Mild intermittent asthma, uncomplicated: Secondary | ICD-10-CM | POA: Diagnosis not present

## 2016-07-29 DIAGNOSIS — K5730 Diverticulosis of large intestine without perforation or abscess without bleeding: Secondary | ICD-10-CM | POA: Diagnosis not present

## 2016-07-29 NOTE — Patient Instructions (Signed)
Today we updated your med list in our EPIC system...    Continue your current medications the same...  We discussed getting allergy re-tested by [facility] at [facility]... Hopefully getting back on allergy shots will help you...  In the meanwhile>>    Continue the Antihistamine in the AM daily (Allegra, Claritin, or Zyrtek)...    Continue the SALINE Nasal Mist regularly by spraying your nose every 1-2H to keep the passages clear...    Continue the FLONASE twice daily as you ar doing...     Add in [employer]m one to two tabs twice daily w/ lots of water...    Continue the Advair twice daily...  Let's get on track w/ our DIET, EXERCISE, weight reduction program!!!    You can do it!!!  Call for any questions or if we can be of service in any way..Marland KitchenMarland Kitchen

## 2016-08-12 DIAGNOSIS — J3089 Other allergic rhinitis: Secondary | ICD-10-CM | POA: Diagnosis not present

## 2016-08-12 DIAGNOSIS — J301 Allergic rhinitis due to pollen: Secondary | ICD-10-CM | POA: Diagnosis not present

## 2016-08-12 DIAGNOSIS — J4540 Moderate persistent asthma, uncomplicated: Secondary | ICD-10-CM | POA: Diagnosis not present

## 2016-08-30 DIAGNOSIS — J3089 Other allergic rhinitis: Secondary | ICD-10-CM | POA: Diagnosis not present

## 2016-08-30 DIAGNOSIS — J301 Allergic rhinitis due to pollen: Secondary | ICD-10-CM | POA: Diagnosis not present

## 2016-09-13 DIAGNOSIS — J3089 Other allergic rhinitis: Secondary | ICD-10-CM | POA: Diagnosis not present

## 2016-09-13 DIAGNOSIS — J301 Allergic rhinitis due to pollen: Secondary | ICD-10-CM | POA: Diagnosis not present

## 2016-09-23 DIAGNOSIS — J301 Allergic rhinitis due to pollen: Secondary | ICD-10-CM | POA: Diagnosis not present

## 2016-09-23 DIAGNOSIS — J3089 Other allergic rhinitis: Secondary | ICD-10-CM | POA: Diagnosis not present

## 2016-09-24 ENCOUNTER — Encounter: Payer: Self-pay | Admitting: Nurse Practitioner

## 2016-09-24 ENCOUNTER — Ambulatory Visit (INDEPENDENT_AMBULATORY_CARE_PROVIDER_SITE_OTHER): Payer: PPO | Admitting: Nurse Practitioner

## 2016-09-24 VITALS — BP 124/82 | HR 80 | Temp 97.5°F | Resp 17 | Ht 64.0 in | Wt 186.8 lb

## 2016-09-24 DIAGNOSIS — J4520 Mild intermittent asthma, uncomplicated: Secondary | ICD-10-CM

## 2016-09-24 DIAGNOSIS — L84 Corns and callosities: Secondary | ICD-10-CM

## 2016-09-24 NOTE — Progress Notes (Signed)
[name]: Patient Care Team: Lauree Chandler, NP as PCP - General (Nurse Practitioner)  Advanced Directive information Does Patient Have a Medical Advance Directive?: No, Would patient like information on creating a medical advance directive?: Yes (MAU/Ambulatory/Procedural Areas - Information given)  Allergies  Allergen Reactions  . Ace Inhibitors   . Amoxicillin     REACTION: unknown? pain in right kidney  . Benicar Hct [Olmesartan Medoxomil-Hctz]     Extreme weakness  . Budesonide-Formoterol Fumarate     Causes tremors and numbness  . Chlorzoxazone [Chlorzoxazone]   . Citalopram Hydrobromide     REACTION: hives  . Citalopram Hydrobromide   . Clonidine Derivatives   . Droperidol     REACTION: hives  . Flexeril [Cyclobenzaprine Hcl]   . Ketorolac Tromethamine     REACTION: hives  . Ketorolac Tromethamine   . Metoclopramide Hcl   . Mometasone Furo-Formoterol Fum     Causes sore throat, blurred vision and unable to sleep--started on med 09-17-10  . Morphine     REACTION: hives and itching  . Olmesartan Medoxomil     REACTION: fatique  . Breo Ellipta [Fluticasone Furoate-Vilanterol] Itching    Pt reports itching with Memory Dance is related to being lactose intolerant.     Chief Complaint  Patient presents with  . Acute Visit    Pt is being seen due to painful corn on left foot. Pt states that she has had it for over a year but normally she can use corn pads and foot file to lessen pain but they did not help this time.      HPI: Patient is a 76 y.o. female seen in the office today due to painful corn on bottom of left foot. Has worked on it with an "egg" and thought it was better but still having lot of pain when she walks.   Following with allergist now, getting allergy shots weekly on mondays Gave her a lot of suggestions to help minimize allergies.   Review of Systems:  Review of Systems  Constitutional: Negative for activity change, appetite change and fatigue.   Respiratory: Positive for cough. Negative for shortness of breath and wheezing.   Cardiovascular: Negative for chest pain.  Musculoskeletal:       Pain on bottom of foot  Skin: Negative for color change, pallor, rash and wound.  Allergic/Immunologic: Positive for environmental allergies.    Past Medical History:  Diagnosis Date  . ALLERGIC RHINITIS   . Anxiety   . Asthma   . Blood type O+   . Cervical strain, acute   . Colon polyp   . Diverticulosis of colon   . Dizziness   . Fibromyalgia   . GERD (gastroesophageal reflux disease)   . History of nephrolithiasis   . Hypercholesterolemia    borderline  . Hypertension   . IBS (irritable bowel syndrome)   . Osteoarthritis   . Personal history of allergy to unspecified medicinal agent   . Postconcussion syndrome   . Vitamin D deficiency    Past Surgical History:  Procedure Laterality Date  . CHOLECYSTECTOMY, LAPAROSCOPIC  2004   for gallstone pancreatitis  . KNEE SURGERY Right   . KNEE SURGERY Left   . THYROID SURGERY     Social History:   reports that she quit smoking about 54 years ago. Her smoking use included Cigarettes. She has a 16.00 pack-year smoking history. She has never used smokeless tobacco. She reports that she does not drink  alcohol or use drugs.  Family History  Problem Relation Age of Onset  . Breast cancer Mother   . Alzheimer's disease Mother   . Heart disease Mother   . COPD Brother   . Heart disease Sister   . Breast cancer Sister   . Alcohol abuse Sister   . Ovarian cancer Paternal Grandmother   . Arthritis Other        Cousins   . COPD Cousin        Maternal side   . Heart attack Maternal Uncle     Medications: Patient's Medications  New Prescriptions   No medications on file  Previous Medications   ACETAMINOPHEN (TYLENOL) 500 MG TABLET    Take 500 mg by mouth every 6 (six) hours as needed (for headache as needed).    ADVAIR HFA 115-21 MCG/ACT INHALER    Inhale 2 puffs into the lungs  2 (two) times daily.    ALBUTEROL (PROVENTIL HFA;VENTOLIN HFA) 108 (90 BASE) MCG/ACT INHALER    Inhale 2 puffs into the lungs every 4 (four) hours as needed for wheezing or shortness of breath. RESCUE   AMLODIPINE (NORVASC) 5 MG TABLET    Take 1 tablet (5 mg total) by mouth daily. for blood pressure   ASPIRIN 81 MG EC TABLET    Take 81 mg by mouth daily.     CETIRIZINE HCL (ZYRTEC ALLERGY) 10 MG CAPS    Take 1 capsule by mouth daily.     CHOLECALCIFEROL (VITAMIN D3) 1000 UNITS CAPS    Take by mouth daily.     ESOMEPRAZOLE (NEXIUM) 40 MG CAPSULE    Take 1 capsule (40 mg total) by mouth daily.   FLUTICASONE (FLONASE) 50 MCG/ACT NASAL SPRAY    SHAKE LIQUID AND USE 1 TO 2 SPRAYS IN EACH NOSTRIL TWICE DAILY   HYDROCHLOROTHIAZIDE (MICROZIDE) 12.5 MG CAPSULE    TAKE 1 CAPSULE BY MOUTH EVERY DAY   MECLIZINE (ANTIVERT) 25 MG TABLET    TAKE 1 TABLET 3 TIMES A DAY AS NEEDED FOR DIZZINESS/NAUSEA   MULTIPLE VITAMINS-MINERALS (CENTRUM SILVER PO)    Take by mouth daily.     OMEGA-3 FATTY ACIDS (FISH OIL MAXIMUM STRENGTH) 1200 MG CAPS    Take by mouth. 2 by mouth daily   POTASSIUM CHLORIDE SA (K-DUR,KLOR-CON) 20 MEQ TABLET    Take 1 tablet (20 mEq total) by mouth daily.   PROPYLENE GLYCOL (SYSTANE BALANCE) 0.6 % SOLN    Apply 1 drop to eye 2 (two) times daily.   SALINE NASAL SPRAY NA    Place 88 mcg into the nose 2 (two) times daily.  Modified Medications   No medications on file  Discontinued Medications   FLUTICASONE-SALMETEROL (ADVAIR DISKUS) 250-50 MCG/DOSE AEPB    Inhale 1 puff into the lungs 2 (two) times daily.     Physical Exam:  Vitals:   09/24/16 0838  BP: 124/82  Pulse: 80  Resp: 17  Temp: 97.5 F (36.4 C)  TempSrc: Oral  SpO2: 96%  Weight: 186 lb 12.8 oz (84.7 kg)  Height: 5' 4"  (1.626 m)   Body mass index is 32.06 kg/m.  Physical Exam  Constitutional: She is oriented to person, place, and time. She appears well-developed and well-nourished.  Cardiovascular: Normal rate, regular  rhythm and normal heart sounds.   Pulmonary/Chest: Effort normal and breath sounds normal.  Neurological: She is alert and oriented to person, place, and time.  Skin:  Callus noted on top of her second metatarsal  joint  Psychiatric: She has a normal mood and affect.    Labs reviewed: Basic Metabolic Panel:  Recent Labs  11/10/15 0903 02/27/16 0911 07/02/16 0834  NA 139 141 140  K 4.0 4.2 3.8  CL 98 100 104  CO2 25 29 23   GLUCOSE 126* 117* 128*  BUN 20 22 22   CREATININE 0.74 0.79 0.87  CALCIUM 9.6 9.7 9.7   Liver Function Tests:  Recent Labs  11/10/15 0903 07/02/16 0834  AST 28 28  ALT 24 25  ALKPHOS 90 79  BILITOT 0.6 0.5  PROT 7.0 7.0  ALBUMIN 4.5 4.3   No results for input(s): LIPASE, AMYLASE in the last 8760 hours. No results for input(s): AMMONIA in the last 8760 hours. CBC:  Recent Labs  02/27/16 0911  WBC 8.6  NEUTROABS 4,300  HGB 14.5  HCT 42.7  MCV 84.9  PLT 316   Lipid Panel:  Recent Labs  11/10/15 0903 07/02/16 0834  CHOL 217* 214*  HDL 44 47*  LDLCALC 133* 136*  TRIG 200* 155*  CHOLHDL 4.9* 4.6   TSH: No results for input(s): TSH in the last 8760 hours. A1C: Lab Results  Component Value Date   HGBA1C 5.7 (H) 07/02/2016     Assessment/Plan 1. Callus of foot Cleanse and callus removed with Scalpel debridement.  Pt tolerated well with immediate relief.   2. Asthma, allergic, mild intermittent, uncomplicated Stable, now following with allergist for weekly injections    Chanel Mcadams K. Harle Battiest  [facility] [phone] 8 am - 5 pm) [phone] (after hou

## 2016-09-24 NOTE — Patient Instructions (Signed)
Check bottom of foot twice daily for the next few days

## 2016-09-30 DIAGNOSIS — J3089 Other allergic rhinitis: Secondary | ICD-10-CM | POA: Diagnosis not present

## 2016-09-30 DIAGNOSIS — J301 Allergic rhinitis due to pollen: Secondary | ICD-10-CM | POA: Diagnosis not present

## 2016-10-04 DIAGNOSIS — J3089 Other allergic rhinitis: Secondary | ICD-10-CM | POA: Diagnosis not present

## 2016-10-04 DIAGNOSIS — J301 Allergic rhinitis due to pollen: Secondary | ICD-10-CM | POA: Diagnosis not present

## 2016-10-11 DIAGNOSIS — J301 Allergic rhinitis due to pollen: Secondary | ICD-10-CM | POA: Diagnosis not present

## 2016-10-11 DIAGNOSIS — J3089 Other allergic rhinitis: Secondary | ICD-10-CM | POA: Diagnosis not present

## 2016-10-15 DIAGNOSIS — J309 Allergic rhinitis, unspecified: Secondary | ICD-10-CM | POA: Diagnosis not present

## 2016-10-17 DIAGNOSIS — J301 Allergic rhinitis due to pollen: Secondary | ICD-10-CM | POA: Diagnosis not present

## 2016-10-17 DIAGNOSIS — J3089 Other allergic rhinitis: Secondary | ICD-10-CM | POA: Diagnosis not present

## 2016-10-21 DIAGNOSIS — J301 Allergic rhinitis due to pollen: Secondary | ICD-10-CM | POA: Diagnosis not present

## 2016-10-21 DIAGNOSIS — J3089 Other allergic rhinitis: Secondary | ICD-10-CM | POA: Diagnosis not present

## 2016-10-24 ENCOUNTER — Other Ambulatory Visit: Payer: Self-pay | Admitting: Nurse Practitioner

## 2016-10-28 DIAGNOSIS — J3089 Other allergic rhinitis: Secondary | ICD-10-CM | POA: Diagnosis not present

## 2016-10-28 DIAGNOSIS — J301 Allergic rhinitis due to pollen: Secondary | ICD-10-CM | POA: Diagnosis not present

## 2016-11-04 DIAGNOSIS — J301 Allergic rhinitis due to pollen: Secondary | ICD-10-CM | POA: Diagnosis not present

## 2016-11-04 DIAGNOSIS — J3089 Other allergic rhinitis: Secondary | ICD-10-CM | POA: Diagnosis not present

## 2016-11-11 DIAGNOSIS — J3089 Other allergic rhinitis: Secondary | ICD-10-CM | POA: Diagnosis not present

## 2016-11-11 DIAGNOSIS — J301 Allergic rhinitis due to pollen: Secondary | ICD-10-CM | POA: Diagnosis not present

## 2016-11-16 ENCOUNTER — Other Ambulatory Visit: Payer: Self-pay | Admitting: Nurse Practitioner

## 2016-11-18 DIAGNOSIS — J301 Allergic rhinitis due to pollen: Secondary | ICD-10-CM | POA: Diagnosis not present

## 2016-11-18 DIAGNOSIS — J3089 Other allergic rhinitis: Secondary | ICD-10-CM | POA: Diagnosis not present

## 2016-11-25 DIAGNOSIS — J301 Allergic rhinitis due to pollen: Secondary | ICD-10-CM | POA: Diagnosis not present

## 2016-11-25 DIAGNOSIS — J3089 Other allergic rhinitis: Secondary | ICD-10-CM | POA: Diagnosis not present

## 2016-12-02 ENCOUNTER — Ambulatory Visit (INDEPENDENT_AMBULATORY_CARE_PROVIDER_SITE_OTHER): Payer: PPO | Admitting: Internal Medicine

## 2016-12-02 ENCOUNTER — Encounter: Payer: Self-pay | Admitting: Internal Medicine

## 2016-12-02 VITALS — BP 150/80 | HR 88 | Temp 98.5°F | Wt 187.0 lb

## 2016-12-02 DIAGNOSIS — J324 Chronic pansinusitis: Secondary | ICD-10-CM | POA: Diagnosis not present

## 2016-12-02 DIAGNOSIS — E669 Obesity, unspecified: Secondary | ICD-10-CM | POA: Diagnosis not present

## 2016-12-02 DIAGNOSIS — J3089 Other allergic rhinitis: Secondary | ICD-10-CM | POA: Diagnosis not present

## 2016-12-02 DIAGNOSIS — J4520 Mild intermittent asthma, uncomplicated: Secondary | ICD-10-CM | POA: Diagnosis not present

## 2016-12-02 NOTE — Progress Notes (Signed)
Location:  [facility] Provider: Dhanya Bogle L. Mariea Clonts, D.O., C.M.D.  Goals of Care:  Advanced Directives 09/24/2016  Does Patient Have a Medical Advance Directive? No  Would patient like information on creating a medical advance directive? Yes (MAU/Ambulatory/Procedural Areas - Information given)   Chief Complaint  Patient presents with  . Acute Visit    sinus pain, allergies, feeling heavy on chest    HPI: Patient is a 76 y.o. female pt of NP Eubanks seen today for an acute visit for sinus pain, allergies and heaviness in her chest.   She has a h/o mild intermittent asthma.  She's on advair and albuterol prn plus cetirizine.  She has had prior allergy testing which is on file from feb 2017.  She just had an allergy injection.  She reports that at first after injections they sometimes flare up.  She also has asteline nasal spray. She also takes flonase.  The tightness in her chest is not as bad as it was earlier today.  She had been wheezing earlier today.She's had dizziness, headache, runny nose, tinnitus for 5 days but worse this am.  They are building her up to her main allergy dose.  Has used the albuterol once in b/w the advair for the past few days.  She also has taken some meclizine half tab but it makes her too sleepy if she takes more.    Hyperglycemia:  Is working on her diet and has cut out sweets.  Hoping she will lose weight.    BMI is 32.  Has been walking 20 mins per day and trying to up to 30 mins per day.  Also does some neck and arm exercises.    Past Medical History:  Diagnosis Date  . ALLERGIC RHINITIS   . Anxiety   . Asthma   . Blood type O+   . Cervical strain, acute   . Colon polyp   . Diverticulosis of colon   . Dizziness   . Fibromyalgia   . GERD (gastroesophageal reflux disease)   . History of nephrolithiasis   . Hypercholesterolemia    borderline  . Hypertension   . IBS (irritable bowel syndrome)   . Osteoarthritis   . Personal history of allergy to  unspecified medicinal agent   . Postconcussion syndrome   . Vitamin D deficiency     Past Surgical History:  Procedure Laterality Date  . CHOLECYSTECTOMY, LAPAROSCOPIC  2004   for gallstone pancreatitis  . KNEE SURGERY Right   . KNEE SURGERY Left   . THYROID SURGERY      Allergies  Allergen Reactions  . Ace Inhibitors   . Amoxicillin     REACTION: unknown? pain in right kidney  . Benicar Hct [Olmesartan Medoxomil-Hctz]     Extreme weakness  . Budesonide-Formoterol Fumarate     Causes tremors and numbness  . Chlorzoxazone [Chlorzoxazone]   . Citalopram Hydrobromide     REACTION: hives  . Citalopram Hydrobromide   . Clonidine Derivatives   . Droperidol     REACTION: hives  . Flexeril [Cyclobenzaprine Hcl]   . Ketorolac Tromethamine     REACTION: hives  . Ketorolac Tromethamine   . Metoclopramide Hcl   . Mometasone Furo-Formoterol Fum     Causes sore throat, blurred vision and unable to sleep--started on med 09-17-10  . Morphine     REACTION: hives and itching  . Olmesartan Medoxomil     REACTION: fatique  . Breo Ellipta [Fluticasone Furoate-Vilanterol] Itching  Pt reports itching with Memory Dance is related to being lactose intolerant.     Allergies as of 12/02/2016      Reactions   Ace Inhibitors    Amoxicillin    REACTION: unknown? pain in right kidney   Benicar Hct [olmesartan Medoxomil-hctz]    Extreme weakness   Budesonide-formoterol Fumarate    Causes tremors and numbness   Chlorzoxazone [chlorzoxazone]    Citalopram Hydrobromide    REACTION: hives   Citalopram Hydrobromide    Clonidine Derivatives    Droperidol    REACTION: hives   Flexeril [cyclobenzaprine Hcl]    Ketorolac Tromethamine    REACTION: hives   Ketorolac Tromethamine    Metoclopramide Hcl    Mometasone Furo-formoterol Fum    Causes sore throat, blurred vision and unable to sleep--started on med 09-17-10   Morphine    REACTION: hives and itching   Olmesartan Medoxomil    REACTION: fatique    Breo Ellipta [fluticasone Furoate-vilanterol] Itching   Pt reports itching with Memory Dance is related to being lactose intolerant.       Medication List       Accurate as of 12/02/16  3:54 PM. Always use your most recent med list.          acetaminophen 500 MG tablet Commonly known as:  TYLENOL Take 500 mg by mouth every 6 (six) hours as needed (for headache as needed).   ADVAIR HFA 115-21 MCG/ACT inhaler Generic drug:  fluticasone-salmeterol Inhale 2 puffs into the lungs 2 (two) times daily.   albuterol 108 (90 Base) MCG/ACT inhaler Commonly known as:  PROVENTIL HFA;VENTOLIN HFA Inhale 2 puffs into the lungs every 4 (four) hours as needed for wheezing or shortness of breath. RESCUE   amLODipine 5 MG tablet Commonly known as:  NORVASC TAKE 1 TABLET BY MOUTH EVERY DAY FOR BLOOD PRESSURE   aspirin 81 MG EC tablet Take 81 mg by mouth daily.   CENTRUM SILVER PO Take by mouth daily.   esomeprazole 40 MG capsule Commonly known as:  NEXIUM Take 1 capsule (40 mg total) by mouth daily.   FISH OIL MAXIMUM STRENGTH 1200 MG Caps Take by mouth. 2 by mouth daily   fluticasone 50 MCG/ACT nasal spray Commonly known as:  FLONASE SHAKE LIQUID AND USE 1 TO 2 SPRAYS IN EACH NOSTRIL TWICE DAILY   hydrochlorothiazide 12.5 MG capsule Commonly known as:  MICROZIDE TAKE 1 CAPSULE BY MOUTH EVERY DAY   KLOR-CON M20 20 MEQ tablet Generic drug:  potassium chloride SA TAKE 1 TABLET BY MOUTH EVERY DAY   meclizine 25 MG tablet Commonly known as:  ANTIVERT TAKE 1 TABLET 3 TIMES A DAY AS NEEDED FOR DIZZINESS/NAUSEA   SALINE NASAL SPRAY NA Place 88 mcg into the nose 2 (two) times daily.   SYSTANE BALANCE 0.6 % Soln Generic drug:  Propylene Glycol Apply 1 drop to eye 2 (two) times daily.   Vitamin D3 1000 units Caps Take by mouth daily.   ZYRTEC ALLERGY 10 MG Caps Generic drug:  Cetirizine HCl Take 1 capsule by mouth daily.       Review of Systems:  Review of Systems    Constitutional: Positive for malaise/fatigue. Negative for chills and fever.  HENT: Positive for congestion, sinus pain and tinnitus. Negative for ear pain and sore throat.   Respiratory: Positive for cough, shortness of breath and wheezing. Negative for sputum production.   Cardiovascular: Negative for chest pain, palpitations and leg swelling.  Neurological: Positive for dizziness. Negative  for loss of consciousness.    Health Maintenance  Topic Date Due  . COLONOSCOPY  05/13/2018 (Originally 07/28/2013)  . INFLUENZA VACCINE  12/11/2016  . TETANUS/TDAP  12/07/2023  . DEXA SCAN  Completed  . PNA vac Low Risk Adult  Completed    Physical Exam: Vitals:   12/02/16 1532  BP: (!) 150/80  Pulse: 88  Temp: 98.5 F (36.9 C)  TempSrc: Oral  SpO2: 97%  Weight: 187 lb (84.8 kg)   Body mass index is 32.1 kg/m. Physical Exam  Constitutional: She is oriented to person, place, and time. She appears well-developed and well-nourished. No distress.  HENT:  Head: Normocephalic and atraumatic.  Postnasal drip  Eyes: Pupils are equal, round, and reactive to light.  Eyes watering  Neck: Neck supple. No thyromegaly present.  Cardiovascular: Normal rate, regular rhythm, normal heart sounds and intact distal pulses.   Pulmonary/Chest: Effort normal and breath sounds normal. She has no wheezes.  Musculoskeletal: Normal range of motion.  Lymphadenopathy:    She has no cervical adenopathy.  Neurological: She is alert and oriented to person, place, and time.  Skin: Skin is warm and dry. There is pallor.  Psychiatric: She has a normal mood and affect.    Labs reviewed: Basic Metabolic Panel:  Recent Labs  02/27/16 0911 07/02/16 0834  NA 141 140  K 4.2 3.8  CL 100 104  CO2 29 23  GLUCOSE 117* 128*  BUN 22 22  CREATININE 0.79 0.87  CALCIUM 9.7 9.7   Liver Function Tests:  Recent Labs  07/02/16 0834  AST 28  ALT 25  ALKPHOS 79  BILITOT 0.5  PROT 7.0  ALBUMIN 4.3   No  results for input(s): LIPASE, AMYLASE in the last 8760 hours. No results for input(s): AMMONIA in the last 8760 hours. CBC:  Recent Labs  02/27/16 0911  WBC 8.6  NEUTROABS 4,300  HGB 14.5  HCT 42.7  MCV 84.9  PLT 316   Lipid Panel:  Recent Labs  07/02/16 0834  CHOL 214*  HDL 47*  LDLCALC 136*  TRIG 155*  CHOLHDL 4.6   Lab Results  Component Value Date   HGBA1C 5.7 (H) 07/02/2016    Assessment/Plan 1. Chronic pansinusitis -cont her regimen as she is taking with saline nasal spray, flonase, cetirizine, allergy shots -if not getting better by later in week, will call back for chlorpheniramine  2. Asthma, allergic, mild intermittent, uncomplicated -cont advair and albuterol -no signs of wheezing or infection  3. Environmental and seasonal allergies -cont cetirizine and flonase and allergy shot  4. Obesity (BMI 30.0-34.9) -is working on her diet and exercise to improve her hyperglycemia so she won't have to go on diabetes meds -BMI has actually gone up though from when it was entered in the problem list before  Labs/tests ordered:  No orders of the defined types were placed in this encounter.   Next appt:  12/31/2016  Lannie Yusuf L. Shontavia Mickel, D.O. [employer] [address], [place] [address] Cell Phone (Mon-Fri 8am-5pm):  [phone] On Call:  [phone] & follow prompts after 5pm & weekends Office Phone:  [phone] Office Fax:  [phone]

## 2016-12-02 NOTE — Patient Instructions (Signed)
Continue flonase, asteline nasal sprays, cetirizine, allergy shots, advair and albuterol.    If you are not improving later this week, I would add chlorpheniramine for the congestion.    Your lungs are clear.

## 2016-12-18 DIAGNOSIS — R05 Cough: Secondary | ICD-10-CM | POA: Diagnosis not present

## 2016-12-18 DIAGNOSIS — J3089 Other allergic rhinitis: Secondary | ICD-10-CM | POA: Diagnosis not present

## 2016-12-18 DIAGNOSIS — J4540 Moderate persistent asthma, uncomplicated: Secondary | ICD-10-CM | POA: Diagnosis not present

## 2016-12-18 DIAGNOSIS — J301 Allergic rhinitis due to pollen: Secondary | ICD-10-CM | POA: Diagnosis not present

## 2016-12-31 ENCOUNTER — Other Ambulatory Visit: Payer: PPO

## 2016-12-31 DIAGNOSIS — R739 Hyperglycemia, unspecified: Secondary | ICD-10-CM | POA: Diagnosis not present

## 2016-12-31 DIAGNOSIS — E782 Mixed hyperlipidemia: Secondary | ICD-10-CM

## 2016-12-31 DIAGNOSIS — I10 Essential (primary) hypertension: Secondary | ICD-10-CM | POA: Diagnosis not present

## 2016-12-31 LAB — CBC WITH DIFFERENTIAL/PLATELET
BASOS PCT: 0 %
Basophils Absolute: 0 cells/uL (ref 0–200)
EOS ABS: 89 {cells}/uL (ref 15–500)
EOS PCT: 1 %
HCT: 42.7 % (ref 35.0–45.0)
Hemoglobin: 14.4 g/dL (ref 11.7–15.5)
LYMPHS ABS: 4005 {cells}/uL — AB (ref 850–3900)
Lymphocytes Relative: 45 %
MCH: 29 pg (ref 27.0–33.0)
MCHC: 33.7 g/dL (ref 32.0–36.0)
MCV: 85.9 fL (ref 80.0–100.0)
MONOS PCT: 9 %
MPV: 10 fL (ref 7.5–12.5)
Monocytes Absolute: 801 cells/uL (ref 200–950)
NEUTROS ABS: 4005 {cells}/uL (ref 1500–7800)
Neutrophils Relative %: 45 %
PLATELETS: 318 10*3/uL (ref 140–400)
RBC: 4.97 MIL/uL (ref 3.80–5.10)
RDW: 13.8 % (ref 11.0–15.0)
WBC: 8.9 10*3/uL (ref 3.8–10.8)

## 2017-01-01 LAB — COMPLETE METABOLIC PANEL WITH GFR
ALBUMIN: 4.2 g/dL (ref 3.6–5.1)
ALK PHOS: 98 U/L (ref 33–130)
ALT: 24 U/L (ref 6–29)
AST: 23 U/L (ref 10–35)
BILIRUBIN TOTAL: 0.6 mg/dL (ref 0.2–1.2)
BUN: 31 mg/dL — AB (ref 7–25)
CO2: 25 mmol/L (ref 20–32)
Calcium: 9.7 mg/dL (ref 8.6–10.4)
Chloride: 102 mmol/L (ref 98–110)
Creat: 0.73 mg/dL (ref 0.60–0.93)
GFR, EST NON AFRICAN AMERICAN: 79 mL/min (ref >=60)
GFR, Est African American: >89 mL/min (ref >=60)
GLUCOSE: 117 mg/dL — AB (ref 65–99)
Potassium: 4.2 mmol/L (ref 3.5–5.3)
SODIUM: 140 mmol/L (ref 135–146)
TOTAL PROTEIN: 6.7 g/dL (ref 6.1–8.1)

## 2017-01-01 LAB — HEMOGLOBIN A1C
Hgb A1c MFr Bld: 5.7 % — ABNORMAL HIGH (ref <5.7)
MEAN PLASMA GLUCOSE: 117 mg/dL

## 2017-01-01 LAB — LIPID PANEL
CHOLESTEROL: 206 mg/dL — AB (ref <200)
HDL: 54 mg/dL (ref >50)
LDL Cholesterol: 125 mg/dL — ABNORMAL HIGH (ref <100)
TRIGLYCERIDES: 133 mg/dL (ref <150)
Total CHOL/HDL Ratio: 3.8 Ratio (ref <5.0)
VLDL: 27 mg/dL (ref <30)

## 2017-01-02 ENCOUNTER — Ambulatory Visit (INDEPENDENT_AMBULATORY_CARE_PROVIDER_SITE_OTHER): Payer: PPO | Admitting: Nurse Practitioner

## 2017-01-02 ENCOUNTER — Encounter: Payer: Self-pay | Admitting: Nurse Practitioner

## 2017-01-02 VITALS — BP 134/82 | HR 75 | Temp 98.0°F | Resp 18 | Ht 64.0 in | Wt 188.6 lb

## 2017-01-02 DIAGNOSIS — J324 Chronic pansinusitis: Secondary | ICD-10-CM | POA: Diagnosis not present

## 2017-01-02 DIAGNOSIS — R739 Hyperglycemia, unspecified: Secondary | ICD-10-CM | POA: Diagnosis not present

## 2017-01-02 DIAGNOSIS — E782 Mixed hyperlipidemia: Secondary | ICD-10-CM

## 2017-01-02 DIAGNOSIS — K219 Gastro-esophageal reflux disease without esophagitis: Secondary | ICD-10-CM

## 2017-01-02 DIAGNOSIS — E669 Obesity, unspecified: Secondary | ICD-10-CM | POA: Diagnosis not present

## 2017-01-02 DIAGNOSIS — J4520 Mild intermittent asthma, uncomplicated: Secondary | ICD-10-CM | POA: Diagnosis not present

## 2017-01-02 DIAGNOSIS — I10 Essential (primary) hypertension: Secondary | ICD-10-CM | POA: Diagnosis not present

## 2017-01-02 MED ORDER — ZOSTER VAC RECOMB ADJUVANTED 50 MCG/0.5ML IM SUSR
0.5000 mL | Freq: Once | INTRAMUSCULAR | 1 refills | Status: AC
Start: 2017-01-02 — End: 2017-01-02

## 2017-01-02 NOTE — Progress Notes (Signed)
Careteam: Patient Care Team: Lauree Chandler, NP as PCP - General (Nurse Practitioner)  Advanced Directive information Does Patient Have a Medical Advance Directive?: No  Allergies  Allergen Reactions  . Ace Inhibitors   . Amoxicillin     REACTION: unknown? pain in right kidney  . Benicar Hct [Olmesartan Medoxomil-Hctz]     Extreme weakness  . Budesonide-Formoterol Fumarate     Causes tremors and numbness  . Chlorzoxazone [Chlorzoxazone]   . Citalopram Hydrobromide     REACTION: hives  . Citalopram Hydrobromide   . Clonidine Derivatives   . Droperidol     REACTION: hives  . Flexeril [Cyclobenzaprine Hcl]   . Ketorolac Tromethamine     REACTION: hives  . Ketorolac Tromethamine   . Metoclopramide Hcl   . Mometasone Furo-Formoterol Fum     Causes sore throat, blurred vision and unable to sleep--started on med 09-17-10  . Morphine     REACTION: hives and itching  . Olmesartan Medoxomil     REACTION: fatique  . Breo Ellipta [Fluticasone Furoate-Vilanterol] Itching    Pt reports itching with Memory Dance is related to being lactose intolerant.     Chief Complaint  Patient presents with  . Medical Management of Chronic Issues    Pt is being seen for a 6 month routine visit. Pt has recently completed antibiotic for sinus infection but she is still having symptoms such as headache, sinus pressure that causes vertigo.   . Other    Pt wants to wait on flu vaccine.     HPI: Patient is a 76 y.o. female seen in the office today for routine follow up.  Pt saw Dr Mariea Clonts on 12/02/2016 due to chronic pansinusitis and recommended to cont saline nasal spray, Flonase, cetirizine and allergy shots.   She had appt with ENT on 10/15/16 who reported symptoms were allergy related.   Went to allergist due to constant ringing in the ears, pressure in her head, vertigo and was given steroid, breathing treatment and antibiotic unsure which one- was on it for 10 days.  Feeling some better but not  100% Still has a Cough but has gotten better.  Allergy shots on hold until she feels better.  conts on advair, using albuterol maybe once daily   Appetite has been well- eating more than she should. Weight is up.  Not doing much exercise due to being so sick this year.   GERD- controlled on Nexium  Review of Systems:  Review of Systems  Constitutional: Negative for chills, fever and malaise/fatigue.  HENT: Positive for congestion and sinus pain. Negative for ear pain, sore throat and tinnitus.   Respiratory: Positive for cough. Negative for sputum production, shortness of breath and wheezing.   Cardiovascular: Negative for chest pain, palpitations and leg swelling.  Gastrointestinal: Positive for constipation.  Musculoskeletal: Positive for back pain (OA ). Negative for myalgias.  Neurological: Negative for dizziness, loss of consciousness and headaches.    Past Medical History:  Diagnosis Date  . ALLERGIC RHINITIS   . Anxiety   . Asthma   . Blood type O+   . Cervical strain, acute   . Colon polyp   . Diverticulosis of colon   . Dizziness   . Fibromyalgia   . GERD (gastroesophageal reflux disease)   . History of nephrolithiasis   . Hypercholesterolemia    borderline  . Hypertension   . IBS (irritable bowel syndrome)   . Osteoarthritis   . Personal history of allergy  to unspecified medicinal agent   . Postconcussion syndrome   . Vitamin D deficiency    Past Surgical History:  Procedure Laterality Date  . CHOLECYSTECTOMY, LAPAROSCOPIC  2004   for gallstone pancreatitis  . KNEE SURGERY Right   . KNEE SURGERY Left   . THYROID SURGERY     Social History:   reports that she quit smoking about 54 years ago. Her smoking use included Cigarettes. She has a 16.00 pack-year smoking history. She has never used smokeless tobacco. She reports that she does not drink alcohol or use drugs.  Family History  Problem Relation Age of Onset  . Breast cancer Mother   . Alzheimer's  disease Mother   . Heart disease Mother   . COPD Brother   . Heart disease Sister   . Breast cancer Sister   . Alcohol abuse Sister   . Ovarian cancer Paternal Grandmother   . Arthritis Other        Cousins   . COPD Cousin        Maternal side   . Heart attack Maternal Uncle     Medications: Patient's Medications  New Prescriptions   No medications on file  Previous Medications   ACETAMINOPHEN (TYLENOL) 500 MG TABLET    Take 500 mg by mouth every 6 (six) hours as needed (for headache as needed).    ADVAIR HFA 115-21 MCG/ACT INHALER    Inhale 2 puffs into the lungs 2 (two) times daily.    ALBUTEROL (PROVENTIL HFA;VENTOLIN HFA) 108 (90 BASE) MCG/ACT INHALER    Inhale 2 puffs into the lungs every 4 (four) hours as needed for wheezing or shortness of breath. RESCUE   AMLODIPINE (NORVASC) 5 MG TABLET    TAKE 1 TABLET BY MOUTH EVERY DAY FOR BLOOD PRESSURE   ASPIRIN 81 MG EC TABLET    Take 81 mg by mouth daily.     CETIRIZINE HCL (ZYRTEC ALLERGY) 10 MG CAPS    Take 1 capsule by mouth daily.     CHOLECALCIFEROL (VITAMIN D3) 1000 UNITS CAPS    Take by mouth daily.     ESOMEPRAZOLE (NEXIUM) 40 MG CAPSULE    Take 1 capsule (40 mg total) by mouth daily.   FLUTICASONE (FLONASE) 50 MCG/ACT NASAL SPRAY    SHAKE LIQUID AND USE 1 TO 2 SPRAYS IN EACH NOSTRIL TWICE DAILY   HYDROCHLOROTHIAZIDE (MICROZIDE) 12.5 MG CAPSULE    TAKE 1 CAPSULE BY MOUTH EVERY DAY   KLOR-CON M20 20 MEQ TABLET    TAKE 1 TABLET BY MOUTH EVERY DAY   MECLIZINE (ANTIVERT) 25 MG TABLET    TAKE 1 TABLET 3 TIMES A DAY AS NEEDED FOR DIZZINESS/NAUSEA   MULTIPLE VITAMINS-MINERALS (CENTRUM SILVER PO)    Take by mouth daily.     OMEGA-3 FATTY ACIDS (FISH OIL MAXIMUM STRENGTH) 1200 MG CAPS    Take by mouth. 2 by mouth daily   PROPYLENE GLYCOL (SYSTANE BALANCE) 0.6 % SOLN    Apply 1 drop to eye 2 (two) times daily.   SALINE NASAL SPRAY NA    Place 88 mcg into the nose 2 (two) times daily.  Modified Medications   Modified Medication  Previous Medication   ZOSTER VAC RECOMB ADJUVANTED (SHINGRIX) INJECTION Zoster Vac Recomb Adjuvanted (SHINGRIX) injection      Inject 0.5 mLs into the muscle once.    Inject 0.5 mLs into the muscle once.  Discontinued Medications   No medications on file     Physical Exam:  Vitals:   01/02/17 0936  BP: 134/82  Pulse: 75  Resp: 18  Temp: 98 F (36.7 C)  TempSrc: Oral  SpO2: 96%  Weight: 188 lb 9.6 oz (85.5 kg)  Height: _0  (1.626 m)   Body mass index is 32.37 kg/m.  Physical Exam  Constitutional: She is oriented to person, place, and time. She appears well-developed and well-nourished. No distress.  HENT:  Head: Normocephalic and atraumatic.  Postnasal drip  Eyes: Pupils are equal, round, and reactive to light.  Neck: Neck supple. No thyromegaly present.  Cardiovascular: Normal rate, regular rhythm and normal heart sounds.   Pulmonary/Chest: Effort normal and breath sounds normal. No respiratory distress. She has no wheezes. She has no rales.  Musculoskeletal: Normal range of motion.  Lymphadenopathy:    She has no cervical adenopathy.  Neurological: She is alert and oriented to person, place, and time.  Skin: Skin is warm and dry.  Psychiatric: She has a normal mood and affect.    Labs reviewed: Basic Metabolic Panel:  Recent Labs  02/27/16 0911 07/02/16 0834 12/31/16 0851  NA 141 140 140  K 4.2 3.8 4.2  CL 100 104 102  CO2 _1 GLUCOSE 117* 128* 117*  BUN 22 22 31*  CREATININE 0.79 0.87 0.73  CALCIUM 9.7 9.7 9.7   Liver Function Tests:  Recent Labs  07/02/16 0834 12/31/16 0851  AST 28 23  ALT 25 24  ALKPHOS 79 98  BILITOT 0.5 0.6  PROT 7.0 6.7  ALBUMIN 4.3 4.2   No results for input(s): LIPASE, AMYLASE in the last 8760 hours. No results for input(s): AMMONIA in the last 8760 hours. CBC:  Recent Labs  02/27/16 0911 12/31/16 0851  WBC 8.6 8.9  NEUTROABS 4,300 4,005  HGB 14.5 14.4  HCT 42.7 42.7  MCV 84.9 85.9  PLT 316 318    Lipid Panel:  Recent Labs  07/02/16 0834 12/31/16 0851  CHOL 214* 206*  HDL 47* 54  LDLCALC 136* 125*  TRIG 155* 133  CHOLHDL 4.6 3.8   TSH: No results for input(s): TSH in the last 8760 hours. A1C: Lab Results  Component Value Date   HGBA1C 5.7 (H) 12/31/2016     Assessment/Plan 1. Chronic pansinusitis Stable, Followed with ENT and allergies, symptoms thought to be associated with allergies. Recently treated with steroids and antibiotics with improvement, to use netti pot twice daily and cont current regimen  2. Asthma, allergic, mild intermittent, uncomplicated Stable, no wheezing or shortness of breath, conts current regimen  3. Obesity (BMI 30.0-34.9) Weight gain noted. Again reinforced need for diet and exercise.   4. Hyperglycemia -encouraged changes in diet. Dash diet given. - Hemoglobin A1c; Future  5. Essential hypertension -stable, will cont current medication, dash diet given. - CBC with Differential/Platelets; Future  6. Mixed hyperlipidemia -LDL elevated but improved, cont to encouraged dietary changes and exercise  - CMP with eGFR; Future - Lipid Panel; Future  7. Gastroesophageal reflux disease without esophagitis Controlled on Nexium, diet modifications encouraged.   Next appt: 6 months, lab work before.  Carlos American. Harle Battiest  [facility] [phone] 8 am - 5 pm) [phone] (after hours)

## 2017-01-02 NOTE — Patient Instructions (Addendum)
Start using netty pot twice daily along with other regimen for your allergies  Once you feel better call us to schedule flu shot.   DASH Eating Plan DASH stands for "Dietary Approaches to Stop Hypertension." The DASH eating plan is a healthy eating plan that has been shown to reduce high blood pressure (hypertension). It may also reduce your risk for type 2 diabetes, heart disease, and stroke. The DASH eating plan may also help with weight loss. What are tips for following this plan? General guidelines  Avoid eating more than 2,300 mg (milligrams) of salt (sodium) a day. If you have hypertension, you may need to reduce your sodium intake to 1,500 mg a day.  Limit alcohol intake to no more than 1 drink a day for nonpregnant women and 2 drinks a day for men. One drink equals 12 oz of beer, 5 oz of wine, or 1 oz of hard liquor.  Work with your health care provider to maintain a healthy body weight or to lose weight. Ask what an ideal weight is for you.  Get at least 30 minutes of exercise that causes your heart to beat faster (aerobic exercise) most days of the week. Activities may include walking, swimming, or biking.  Work with your health care provider or diet and nutrition specialist (dietitian) to adjust your eating plan to your individual calorie needs. Reading food labels  Check food labels for the amount of sodium per serving. Choose foods with less than 5 percent of the Daily Value of sodium. Generally, foods with less than 300 mg of sodium per serving fit into this eating plan.  To find whole grains, look for the word "whole" as the first word in the ingredient list. Shopping  Buy products labeled as "low-sodium" or "no salt added."  Buy fresh foods. Avoid canned foods and premade or frozen meals. Cooking  Avoid adding salt when cooking. Use salt-free seasonings or herbs instead of table salt or sea salt. Check with your health care provider or pharmacist before using salt  substitutes.  Do not fry foods. Cook foods using healthy methods such as baking, boiling, grilling, and broiling instead.  Cook with heart-healthy oils, such as olive, canola, soybean, or sunflower oil. Meal planning   Eat a balanced diet that includes: ? 5 or more servings of fruits and vegetables each day. At each meal, try to fill half of your plate with fruits and vegetables. ? Up to 6-8 servings of whole grains each day. ? Less than 6 oz of lean meat, poultry, or fish each day. A 3-oz serving of meat is about the same size as a deck of cards. One egg equals 1 oz. ? 2 servings of low-fat dairy each day. ? A serving of nuts, seeds, or beans 5 times each week. ? Heart-healthy fats. Healthy fats called Omega-3 fatty acids are found in foods such as flaxseeds and coldwater fish, like sardines, salmon, and mackerel.  Limit how much you eat of the following: ? Canned or prepackaged foods. ? Food that is high in trans fat, such as fried foods. ? Food that is high in saturated fat, such as fatty meat. ? Sweets, desserts, sugary drinks, and other foods with added sugar. ? Full-fat dairy products.  Do not salt foods before eating.  Try to eat at least 2 vegetarian meals each week.  Eat more home-cooked food and less restaurant, buffet, and fast food.  When eating at a restaurant, ask that your food be prepared with  less salt or no salt, if possible. What foods are recommended? The items listed may not be a complete list. Talk with your dietitian about what dietary choices are best for you. Grains Whole-grain or whole-wheat bread. Whole-grain or whole-wheat pasta. Brown rice. [place]. Bulgur. Whole-grain and low-sodium cereals. Pita bread. Low-fat, low-sodium crackers. Whole-wheat flour tortillas. Vegetables Fresh or frozen vegetables (raw, steamed, roasted, or grilled). Low-sodium or reduced-sodium tomato and vegetable juice. Low-sodium or reduced-sodium tomato sauce and tomato  paste. Low-sodium or reduced-sodium canned vegetables. Fruits All fresh, dried, or frozen fruit. Canned fruit in natural juice (without added sugar). Meat and other protein foods Skinless chicken or [place]. Ground chicken or [place]. Pork with fat trimmed off. Fish and seafood. Egg whites. Dried beans, peas, or lentils. Unsalted nuts, nut butters, and seeds. Unsalted canned beans. Lean cuts of beef with fat trimmed off. Low-sodium, lean deli meat. Dairy Low-fat (1%) or fat-free (skim) milk. Fat-free, low-fat, or reduced-fat cheeses. Nonfat, low-sodium ricotta or cottage cheese. Low-fat or nonfat yogurt. Low-fat, low-sodium cheese. Fats and oils Soft margarine without trans fats. Vegetable oil. Low-fat, reduced-fat, or light mayonnaise and salad dressings (reduced-sodium). Canola, safflower, olive, soybean, and sunflower oils. Avocado. Seasoning and other foods Herbs. Spices. Seasoning mixes without salt. Unsalted popcorn and pretzels. Fat-free sweets. What foods are not recommended? The items listed may not be a complete list. Talk with your dietitian about what dietary choices are best for you. Grains Baked goods made with fat, such as croissants, muffins, or some breads. Dry pasta or rice meal packs. Vegetables Creamed or fried vegetables. Vegetables in a cheese sauce. Regular canned vegetables (not low-sodium or reduced-sodium). Regular canned tomato sauce and paste (not low-sodium or reduced-sodium). Regular tomato and vegetable juice (not low-sodium or reduced-sodium). Angie Fava. Olives. Fruits Canned fruit in a light or heavy syrup. Fried fruit. Fruit in cream or butter sauce. Meat and other protein foods Fatty cuts of meat. Ribs. Fried meat. Berniece Salines. Sausage. Bologna and other processed lunch meats. Salami. Fatback. Hotdogs. Bratwurst. Salted nuts and seeds. Canned beans with added salt. Canned or smoked fish. Whole eggs or egg yolks. Chicken or [place] with skin. Dairy Whole or 2% milk,  cream, and half-and-half. Whole or full-fat cream cheese. Whole-fat or sweetened yogurt. Full-fat cheese. Nondairy creamers. Whipped toppings. Processed cheese and cheese spreads. Fats and oils Butter. Stick margarine. Lard. Shortening. Ghee. Bacon fat. Tropical oils, such as coconut, palm kernel, or palm oil. Seasoning and other foods Salted popcorn and pretzels. Onion salt, garlic salt, seasoned salt, table salt, and sea salt. Worcestershire sauce. Tartar sauce. Barbecue sauce. Teriyaki sauce. Soy sauce, including reduced-sodium. Steak sauce. Canned and packaged gravies. Fish sauce. Oyster sauce. Cocktail sauce. Horseradish that you find on the shelf. Ketchup. Mustard. Meat flavorings and tenderizers. Bouillon cubes. Hot sauce and Tabasco sauce. Premade or packaged marinades. Premade or packaged taco seasonings. Relishes. Regular salad dressings. Where to find more information:  National Heart, Lung, and [place]: https://wilson-eaton.com/  American Heart Association: www.heart.org Summary  The DASH eating plan is a healthy eating plan that has been shown to reduce high blood pressure (hypertension). It may also reduce your risk for type 2 diabetes, heart disease, and stroke.  With the DASH eating plan, you should limit salt (sodium) intake to 2,300 mg a day. If you have hypertension, you may need to reduce your sodium intake to 1,500 mg a day.  When on the DASH eating plan, aim to eat more fresh fruits and vegetables, whole grains, lean proteins,  low-fat dairy, and heart-healthy fats.  Work with your health care provider or diet and nutrition specialist (dietitian) to adjust your eating plan to your individual calorie needs. This information is not intended to replace advice given to you by your health care provider. Make sure you discuss any questions you have with your health care provider. Document Released: 04/18/2011 Document Revised: 04/22/2016 Document Reviewed: 04/22/2016 Elsevier  Interactive Patient Education  2017 [employer].

## 2017-01-18 ENCOUNTER — Other Ambulatory Visit: Payer: Self-pay | Admitting: Nurse Practitioner

## 2017-01-18 DIAGNOSIS — K219 Gastro-esophageal reflux disease without esophagitis: Secondary | ICD-10-CM

## 2017-02-04 ENCOUNTER — Other Ambulatory Visit: Payer: Self-pay | Admitting: Nurse Practitioner

## 2017-02-04 DIAGNOSIS — Z1231 Encounter for screening mammogram for malignant neoplasm of breast: Secondary | ICD-10-CM

## 2017-02-14 ENCOUNTER — Ambulatory Visit
Admission: RE | Admit: 2017-02-14 | Discharge: 2017-02-14 | Disposition: A | Discharge status: Home / Self Care | Payer: PPO | Source: Ambulatory Visit | Attending: Nurse Practitioner | Admitting: Nurse Practitioner

## 2017-02-14 DIAGNOSIS — Z1231 Encounter for screening mammogram for malignant neoplasm of breast: Secondary | ICD-10-CM

## 2017-02-28 ENCOUNTER — Ambulatory Visit (INDEPENDENT_AMBULATORY_CARE_PROVIDER_SITE_OTHER): Payer: PPO | Admitting: Nurse Practitioner

## 2017-02-28 ENCOUNTER — Encounter: Payer: Self-pay | Admitting: Nurse Practitioner

## 2017-02-28 VITALS — BP 128/82 | HR 78 | Temp 98.7°F | Resp 18 | Ht 64.0 in | Wt 192.2 lb

## 2017-02-28 DIAGNOSIS — J4520 Mild intermittent asthma, uncomplicated: Secondary | ICD-10-CM

## 2017-02-28 DIAGNOSIS — J324 Chronic pansinusitis: Secondary | ICD-10-CM | POA: Diagnosis not present

## 2017-02-28 MED ORDER — AMLODIPINE BESYLATE 5 MG PO TABS: ORAL_TABLET | ORAL | 1 refills | Status: DC

## 2017-02-28 MED ORDER — HYDROCHLOROTHIAZIDE 12.5 MG PO CAPS
ORAL_CAPSULE | ORAL | 1 refills | Status: DC
Start: 2017-02-28 — End: 2017-11-17

## 2017-02-28 MED ORDER — LEVOFLOXACIN 500 MG PO TABS: 500.0000 mg | ORAL_TABLET | Freq: Every day | ORAL | 0 refills | Status: DC

## 2017-02-28 MED ORDER — POTASSIUM CHLORIDE CRYS ER 20 MEQ PO TBCR: 20.0000 meq | EXTENDED_RELEASE_TABLET | Freq: Every day | ORAL | 1 refills | Status: DC

## 2017-02-28 NOTE — Progress Notes (Signed)
Careteam: Patient Care Team: Carla Chandler, NP as PCP - General (Nurse Practitioner)  Advanced Directive information Does Patient Have a Medical Advance Directive?: No  Allergies  Allergen Reactions  . Ace Inhibitors   . Amoxicillin     REACTION: unknown? pain in right kidney  . Benicar Hct [Olmesartan Medoxomil-Hctz]     Extreme weakness  . Budesonide-Formoterol Fumarate     Causes tremors and numbness  . Chlorzoxazone [Chlorzoxazone]   . Citalopram Hydrobromide     REACTION: hives  . Citalopram Hydrobromide   . Clonidine Derivatives   . Droperidol     REACTION: hives  . Flexeril [Cyclobenzaprine Hcl]   . Ketorolac Tromethamine     REACTION: hives  . Ketorolac Tromethamine   . Metoclopramide Hcl   . Mometasone Furo-Formoterol Fum     Causes sore throat, blurred vision and unable to sleep--started on med 09-17-10  . Morphine     REACTION: hives and itching  . Olmesartan Medoxomil     REACTION: fatique  . Breo Ellipta [Fluticasone Furoate-Vilanterol] Itching    Pt reports itching with Memory Dance is related to being lactose intolerant.     Chief Complaint  Patient presents with  . Acute Visit    Pt is being seen due to chronic sinus pressure/pain, headache, cough, dizziness. Pt has not been able to get allergy shot in 4 months.      HPI: Patient is a 76 y.o. female seen in the office today with complaints of sinus drainage, pressure, congestion, and cough. She states that this is a chronic problem. She usually gets allergy injection, but states that her symptoms have been so bad that she has been unable to get her allergy injections for the past 5 months. Patient takes Mucinex OTC to help with congestion.  Patient states that he cough has gotten worse in past week. She states she is coughing up white sputum.   She states she purchased a new Netti pot and has been using it to flush her sinuses. She says that the sinus pressure has been causing her to get  headaches.  Patient states that the residents where she lives have a lot of cats that roam around. She says that it may be making her allergies worse.  Took antibiotic, unsure of what this was, back in august and had some improvement but then it got worse again. Went to ENT for this but has not been back.  Review of Systems:  Review of Systems  Constitutional: Negative for chills and fever.  HENT: Positive for congestion, ear pain, sinus pain and sore throat. Negative for ear discharge.   Eyes: Negative for blurred vision, pain, discharge and redness.  Respiratory: Positive for cough and sputum production. Negative for shortness of breath.   Cardiovascular: Negative for chest pain and palpitations.  Genitourinary: Positive for frequency (at night). Negative for dysuria.  Neurological: Positive for headaches. Negative for dizziness.  Psychiatric/Behavioral: Negative for depression and suicidal ideas.    Past Medical History:  Diagnosis Date  . ALLERGIC RHINITIS   . Anxiety   . Asthma   . Blood type O+   . Cervical strain, acute   . Colon polyp   . Diverticulosis of colon   . Dizziness   . Fibromyalgia   . GERD (gastroesophageal reflux disease)   . History of nephrolithiasis   . Hypercholesterolemia    borderline  . Hypertension   . IBS (irritable bowel syndrome)   . Osteoarthritis   .  Personal history of allergy to unspecified medicinal agent   . Postconcussion syndrome   . Vitamin D deficiency    Past Surgical History:  Procedure Laterality Date  . CHOLECYSTECTOMY, LAPAROSCOPIC  2004   for gallstone pancreatitis  . KNEE SURGERY Right   . KNEE SURGERY Left   . THYROID SURGERY     Social History:   reports that she quit smoking about 54 years ago. Her smoking use included Cigarettes. She has a 16.00 pack-year smoking history. She has never used smokeless tobacco. She reports that she does not drink alcohol or use drugs.  Family History  Problem Relation Age of Onset   . Breast cancer Mother   . Alzheimer's disease Mother   . Heart disease Mother   . COPD Brother   . Heart disease Sister   . Breast cancer Sister   . Alcohol abuse Sister   . Ovarian cancer Paternal Grandmother   . Arthritis Other        Cousins   . COPD Cousin        Maternal side   . Heart attack Maternal Uncle     Medications: Patient's Medications  New Prescriptions   No medications on file  Previous Medications   ACETAMINOPHEN (TYLENOL) 500 MG TABLET    Take 500 mg by mouth every 6 (six) hours as needed (for headache as needed).    ADVAIR HFA 115-21 MCG/ACT INHALER    Inhale 2 puffs into the lungs 2 (two) times daily.    ALBUTEROL (PROVENTIL HFA;VENTOLIN HFA) 108 (90 BASE) MCG/ACT INHALER    Inhale 2 puffs into the lungs every 4 (four) hours as needed for wheezing or shortness of breath. RESCUE   AMLODIPINE (NORVASC) 5 MG TABLET    TAKE 1 TABLET BY MOUTH EVERY DAY FOR BLOOD PRESSURE   ASPIRIN 81 MG EC TABLET    Take 81 mg by mouth daily.     CETIRIZINE HCL (ZYRTEC ALLERGY) 10 MG CAPS    Take 1 capsule by mouth daily.     CHOLECALCIFEROL (VITAMIN D3) 1000 UNITS CAPS    Take by mouth daily.     ESOMEPRAZOLE (NEXIUM) 40 MG CAPSULE    TAKE 1 CAPSULE (40 MG TOTAL) BY MOUTH DAILY.   FLUTICASONE (FLONASE) 50 MCG/ACT NASAL SPRAY    SHAKE LIQUID AND USE 1 TO 2 SPRAYS IN EACH NOSTRIL TWICE DAILY   HYDROCHLOROTHIAZIDE (MICROZIDE) 12.5 MG CAPSULE    TAKE 1 CAPSULE BY MOUTH EVERY DAY   KLOR-CON M20 20 MEQ TABLET    TAKE 1 TABLET BY MOUTH EVERY DAY   MECLIZINE (ANTIVERT) 25 MG TABLET    TAKE 1 TABLET 3 TIMES A DAY AS NEEDED FOR DIZZINESS/NAUSEA   MULTIPLE VITAMINS-MINERALS (CENTRUM SILVER PO)    Take by mouth daily.     OMEGA-3 FATTY ACIDS (FISH OIL MAXIMUM STRENGTH) 1200 MG CAPS    Take by mouth. 2 by mouth daily   PROPYLENE GLYCOL (SYSTANE BALANCE) 0.6 % SOLN    Apply 1 drop to eye 2 (two) times daily.   SALINE NASAL SPRAY NA    Place 88 mcg into the nose 2 (two) times daily.  Modified  Medications   No medications on file  Discontinued Medications   No medications on file     Physical Exam:  Vitals:   02/28/17 0833  BP: 128/82  Pulse: 78  Resp: 18  Temp: 98.7 F (37.1 C)  TempSrc: Oral  SpO2: 96%  Weight: 192 lb 3.2  oz (87.2 kg)  Height: 5' 4"  (1.626 m)   Body mass index is 32.99 kg/m.  Physical Exam  Constitutional: She appears well-developed and well-nourished. No distress.  HENT:  Head: Normocephalic and atraumatic.  Right Ear: External ear normal.  Left Ear: External ear normal.  Nose: Rhinorrhea (Postnasal drip) present.  Mouth/Throat: Oropharynx is clear and moist. No oropharyngeal exudate.  Eyes: Pupils are equal, round, and reactive to light. Conjunctivae and EOM are normal. Right eye exhibits no discharge. Left eye exhibits no discharge.  Neck: Normal range of motion. Neck supple.  Cardiovascular: Normal rate, regular rhythm, normal heart sounds and intact distal pulses.   Pulmonary/Chest: Effort normal and breath sounds normal. No respiratory distress. She has no wheezes.  Dry cough  Skin: Skin is warm and dry. Capillary refill takes 2 to 3 seconds. She is not diaphoretic.  Psychiatric: She has a normal mood and affect.    Labs reviewed: Basic Metabolic Panel:  Recent Labs  07/02/16 0834 12/31/16 0851  NA 140 140  K 3.8 4.2  CL 104 102  CO2 23 25  GLUCOSE 128* 117*  BUN 22 31*  CREATININE 0.87 0.73  CALCIUM 9.7 9.7   Liver Function Tests:  Recent Labs  07/02/16 0834 12/31/16 0851  AST 28 23  ALT 25 24  ALKPHOS 79 98  BILITOT 0.5 0.6  PROT 7.0 6.7  ALBUMIN 4.3 4.2   No results for input(s): LIPASE, AMYLASE in the last 8760 hours. No results for input(s): AMMONIA in the last 8760 hours. CBC:  Recent Labs  12/31/16 0851  WBC 8.9  NEUTROABS 4,005  HGB 14.4  HCT 42.7  MCV 85.9  PLT 318   Lipid Panel:  Recent Labs  07/02/16 0834 12/31/16 0851  CHOL 214* 206*  HDL 47* 54  LDLCALC 136* 125*  TRIG 155*  133  CHOLHDL 4.6 3.8   TSH: No results for input(s): TSH in the last 8760 hours. A1C: Lab Results  Component Value Date   HGBA1C 5.7 (H) 12/31/2016     Assessment/Plan 1. Chronic pansinusitis - levofloxacin (LEVAQUIN) 500 MG tablet; Take 1 tablet (500 mg total) by mouth daily.  Dispense: 7 tablet; Refill: 0 -cont netty pot and nasal rinse To use mucinex DM by mouth twice daily with full glass of water  DM- dextromethorphan helps with cough Increase water intake.  2. Asthma, allergic, mild intermittent, uncomplicated  Unable to receive allergy injections. Continue medications for asthma to help with symptoms.   To follow up with Dr Redmond Baseman if this does not resolves symptoms  Cont nasal spray, allergy medication and netty pot   Arlynn Mcdermid K. Harle Battiest  [facility] [phone] 8 am - 5 pm) [phone] (after hours)

## 2017-02-28 NOTE — Patient Instructions (Addendum)
To use mucinex DM by mouth twice daily with full glass of water  DM- dextromethorphan helps with cough Increase water intake.   To follow up with Dr Redmond Baseman if this does not resolves symptoms  Cont nasal spray, allergy medication and netty pot

## 2017-03-04 ENCOUNTER — Other Ambulatory Visit: Payer: Self-pay | Admitting: *Deleted

## 2017-03-04 MED ORDER — FLUTICASONE PROPIONATE 50 MCG/ACT NA SUSP: NASAL | 3 refills | Status: DC

## 2017-03-04 NOTE — Telephone Encounter (Signed)
[facility]

## 2017-03-17 ENCOUNTER — Other Ambulatory Visit: Payer: Self-pay

## 2017-03-17 ENCOUNTER — Emergency Department (HOSPITAL_COMMUNITY): Payer: PPO

## 2017-03-17 ENCOUNTER — Encounter (HOSPITAL_COMMUNITY): Payer: Self-pay | Admitting: Emergency Medicine

## 2017-03-17 ENCOUNTER — Emergency Department (HOSPITAL_COMMUNITY)
Admission: EM | Admit: 2017-03-17 | Discharge: 2017-03-17 | Disposition: A | Discharge status: Home / Self Care | Payer: PPO | Attending: Emergency Medicine | Admitting: Emergency Medicine

## 2017-03-17 DIAGNOSIS — Z87891 Personal history of nicotine dependence: Secondary | ICD-10-CM | POA: Insufficient documentation

## 2017-03-17 DIAGNOSIS — I10 Essential (primary) hypertension: Secondary | ICD-10-CM | POA: Insufficient documentation

## 2017-03-17 DIAGNOSIS — R059 Cough, unspecified: Secondary | ICD-10-CM

## 2017-03-17 DIAGNOSIS — J45909 Unspecified asthma, uncomplicated: Secondary | ICD-10-CM | POA: Insufficient documentation

## 2017-03-17 DIAGNOSIS — J309 Allergic rhinitis, unspecified: Secondary | ICD-10-CM | POA: Diagnosis not present

## 2017-03-17 DIAGNOSIS — J3089 Other allergic rhinitis: Secondary | ICD-10-CM | POA: Diagnosis not present

## 2017-03-17 DIAGNOSIS — R0602 Shortness of breath: Secondary | ICD-10-CM | POA: Diagnosis not present

## 2017-03-17 DIAGNOSIS — Z7982 Long term (current) use of aspirin: Secondary | ICD-10-CM | POA: Insufficient documentation

## 2017-03-17 DIAGNOSIS — R05 Cough: Secondary | ICD-10-CM | POA: Diagnosis not present

## 2017-03-17 DIAGNOSIS — Z79899 Other long term (current) drug therapy: Secondary | ICD-10-CM | POA: Insufficient documentation

## 2017-03-17 DIAGNOSIS — M546 Pain in thoracic spine: Secondary | ICD-10-CM | POA: Insufficient documentation

## 2017-03-17 LAB — CBC
HCT: 43.9 % (ref 36.0–46.0)
Hemoglobin: 15 g/dL (ref 12.0–15.0)
MCH: 29.3 pg (ref 26.0–34.0)
MCHC: 34.2 g/dL (ref 30.0–36.0)
MCV: 85.7 fL (ref 78.0–100.0)
Platelets: 287 10*3/uL (ref 150–400)
RBC: 5.12 MIL/uL — ABNORMAL HIGH (ref 3.87–5.11)
RDW: 13.4 % (ref 11.5–15.5)
WBC: 8.6 10*3/uL (ref 4.0–10.5)

## 2017-03-17 LAB — BASIC METABOLIC PANEL
Anion gap: 9 (ref 5–15)
BUN: 21 mg/dL — AB (ref 6–20)
CALCIUM: 9.5 mg/dL (ref 8.9–10.3)
CO2: 26 mmol/L (ref 22–32)
CREATININE: 0.75 mg/dL (ref 0.44–1.00)
Chloride: 104 mmol/L (ref 101–111)
GFR calc Af Amer: >60 mL/min (ref >60)
GLUCOSE: 113 mg/dL — AB (ref 65–99)
Potassium: 4.4 mmol/L (ref 3.5–5.1)
Sodium: 139 mmol/L (ref 135–145)

## 2017-03-17 LAB — I-STAT TROPONIN, ED: TROPONIN I, POC: 0 ng/mL (ref 0.00–0.08)

## 2017-03-17 LAB — BRAIN NATRIURETIC PEPTIDE: B Natriuretic Peptide: 29 pg/mL (ref 0.0–100.0)

## 2017-03-17 MED ORDER — MAGIC MOUTHWASH: 5.0000 mL | Freq: Three times a day (TID) | ORAL | 0 refills | Status: DC | PRN

## 2017-03-17 MED ORDER — BENZONATATE 100 MG PO CAPS: 100.0000 mg | ORAL_CAPSULE | Freq: Three times a day (TID) | ORAL | 0 refills | Status: DC

## 2017-03-17 MED ORDER — BENZONATATE 100 MG PO CAPS
200.0000 mg | ORAL_CAPSULE | Freq: Once | ORAL | Status: AC
  Administered 2017-03-17: 200 mg via ORAL
  Filled 2017-03-17: qty 2

## 2017-03-17 NOTE — ED Triage Notes (Signed)
Sick x several months with cough and allergies yesterday she had  baCK PAIN, ,   Had rx for   An antibiotic but she  Was afraind to take it she said , did not tell her doct feels worse has been tx herself

## 2017-03-17 NOTE — ED Provider Notes (Signed)
[facility] Provider Note   CSN: [id number] Arrival date & time: 03/17/17  0706     History   Chief Complaint Chief Complaint  Patient presents with  . Cough  . Shortness of Breath    HPI [name] is a 76 y.o. female.  HPI Patient has had persistent problems with coughing and sneezing and dry scratchy throat.  This is been a recurrent issue with long-standing allergies.  The patient reports however this week her cough has been more harsh and dry.  She has not developed fever.  No shortness of breath.  She does report that today when she awakened she had pain between her shoulder blades.  That is what prompted her to come to the emergency department.  Last month she had been seen at her primary care doctor's office and was prescribed Levaquin to take.  She reports however after she reviewed all of the side effects and talked to the pharmacist she was to afraid to take it so she never did end up taking the antibiotic.  That was approximately 3 weeks ago.  Patient has been seen by ENT, Dr. Redmond Baseman in the past.  She reports he advises her that her symptoms are due to her allergies. Past Medical History:  Diagnosis Date  . ALLERGIC RHINITIS   . Anxiety   . Asthma   . Blood type O+   . Cervical strain, acute   . Colon polyp   . Diverticulosis of colon   . Dizziness   . Fibromyalgia   . GERD (gastroesophageal reflux disease)   . History of nephrolithiasis   . Hypercholesterolemia    borderline  . Hypertension   . IBS (irritable bowel syndrome)   . Osteoarthritis   . Personal history of allergy to unspecified medicinal agent   . Postconcussion syndrome   . Vitamin D deficiency     Patient Active Problem List   Diagnosis Date Noted  . Chronic pansinusitis 12/02/2016  . Asthma, allergic, mild intermittent, uncomplicated 29/51/8841  . Environmental and seasonal allergies 12/02/2016  . Obesity (BMI 30.0-34.9) 12/02/2016  . Urine  frequency 06/26/2016  . Obese 06/26/2016  . Hyperglycemia 11/13/2015  . Numbness and tingling of both legs below knees 05/24/2014  . Dupuytren's contracture of both hands 01/18/2014  . Dyslipidemia 10/08/2011  . Asthmatic bronchitis 04/25/2011  . Acute sinusitis, unspecified 12/20/2010  . Back pain 07/12/2010  . NEPHROLITHIASIS 04/26/2009  . Vitamin D deficiency 10/30/2008  . COLONIC POLYPS 10/22/2007  . DIVERTICULOSIS OF COLON 10/22/2007  . Postconcussion syndrome 07/10/2007  . Seasonal and perennial allergic rhinitis 06/13/2007  . GERD 06/13/2007  . IBS 06/13/2007  . FIBROMYALGIA 06/13/2007  . PERSONAL HISTORY ALLERGY UNSPEC MEDICINAL AGENT 06/13/2007  . Anxiety state 05/04/2007  . Essential hypertension 05/04/2007  . Osteoarthritis 05/04/2007  . DIZZINESS 04/28/2007    Past Surgical History:  Procedure Laterality Date  . CHOLECYSTECTOMY, LAPAROSCOPIC  2004   for gallstone pancreatitis  . KNEE SURGERY Right   . KNEE SURGERY Left   . THYROID SURGERY      OB History    No data available       Home Medications    Prior to Admission medications   Medication Sig Start Date End Date Taking? Authorizing Provider  acetaminophen (TYLENOL) 500 MG tablet Take 500 mg by mouth every 6 (six) hours as needed (for headache as needed).     [provider]  ADVAIR [facility] 660-63 MCG/ACT inhaler Inhale  2 puffs into the lungs 2 (two) times daily.  08/17/16   [provider]  albuterol (PROVENTIL HFA;VENTOLIN HFA) 108 (90 Base) MCG/ACT inhaler Inhale 2 puffs into the lungs every 4 (four) hours as needed for wheezing or shortness of breath. RESCUE 05/23/16 06/29/18  Lauree Chandler, NP  amLODipine (NORVASC) 5 MG tablet TAKE 1 TABLET BY MOUTH EVERY DAY FOR BLOOD PRESSURE 02/28/17   Lauree Chandler, NP  aspirin 81 MG EC tablet Take 81 mg by mouth daily.      [provider]  Cetirizine HCl (ZYRTEC ALLERGY) 10 MG CAPS Take 1 capsule by mouth daily.      [provider]  Cholecalciferol (VITAMIN D3) 1000 UNITS CAPS Take by mouth daily.      [provider]  esomeprazole (NEXIUM) 40 MG capsule TAKE 1 CAPSULE (40 MG TOTAL) BY MOUTH DAILY. 01/20/17   Lauree Chandler, NP  fluticasone (FLONASE) 50 MCG/ACT nasal spray Shake liquid and use one to two sprays in each nostril twice daily 03/04/17   Lauree Chandler, NP  hydrochlorothiazide (MICROZIDE) 12.5 MG capsule TAKE 1 CAPSULE BY MOUTH EVERY DAY 02/28/17   Lauree Chandler, NP  levofloxacin (LEVAQUIN) 500 MG tablet Take 1 tablet (500 mg total) by mouth daily. 02/28/17   Lauree Chandler, NP  meclizine (ANTIVERT) 25 MG tablet TAKE 1 TABLET 3 TIMES A DAY AS NEEDED FOR DIZZINESS/NAUSEA 05/23/16   Lauree Chandler, NP  Multiple Vitamins-Minerals (CENTRUM SILVER PO) Take by mouth daily.      [provider]  Omega-3 Fatty Acids (FISH OIL MAXIMUM STRENGTH) 1200 MG CAPS Take by mouth. 2 by mouth daily    [provider]  potassium chloride SA (KLOR-CON M20) 20 MEQ tablet Take 1 tablet (20 mEq total) by mouth daily. 02/28/17   Lauree Chandler, NP  Propylene Glycol (SYSTANE BALANCE) 0.6 % SOLN Apply 1 drop to eye 2 (two) times daily.    [provider]  SALINE NASAL SPRAY NA Place 88 mcg into the nose 2 (two) times daily.    [provider]    Family History Family History  Problem Relation Age of Onset  . Breast cancer Mother   . Alzheimer's disease Mother   . Heart disease Mother   . COPD Brother   . Heart disease Sister   . Breast cancer Sister   . Alcohol abuse Sister   . Ovarian cancer Paternal Grandmother   . Arthritis Other        Cousins   . COPD Cousin        Maternal side   . Heart attack Maternal Uncle     Social History Social History   Tobacco Use  . Smoking status: Former Smoker    Packs/day: 2.00    Years: 8.00    Pack years: 16.00    Types: Cigarettes    Last attempt to quit: 05/13/1962    Years since quitting: 54.8  .  Smokeless tobacco: Never Used  Substance Use Topics  . Alcohol use: No  . Drug use: No     Allergies   Ace inhibitors; Amoxicillin; Benicar hct [olmesartan medoxomil-hctz]; Budesonide-formoterol fumarate; Chlorzoxazone [chlorzoxazone]; Citalopram hydrobromide; Citalopram hydrobromide; Clonidine derivatives; Droperidol; Flexeril [cyclobenzaprine hcl]; Ketorolac tromethamine; Ketorolac tromethamine; Metoclopramide hcl; Mometasone furo-formoterol fum; Morphine; Olmesartan medoxomil; and Breo ellipta [fluticasone furoate-vilanterol]   Review of Systems Review of Systems 10 Systems reviewed and are negative for acute change except as noted in the HPI.  Physical Exam Updated Vital Signs BP (!) 190/66   Pulse 85   Temp 98.7 F (37.1 C) (Oral)   Resp 20   SpO2 94%   Physical Exam  Constitutional: She is oriented to person, place, and time. She appears well-developed and well-nourished.  Non-toxic appearance. She does not appear ill. No distress.  HENT:  Head: Normocephalic and atraumatic.  Mouth/Throat: Oropharynx is clear and moist. No oropharyngeal exudate or posterior oropharyngeal edema.  Bilateral TMs slightly dull but no erythema or bulging.  Posterior oropharynx is widely patent.  No erythema of the tonsillar pillars or uvula.  No visible postnasal drainage.  Both nares are patent.  Eyes: EOM are normal.  Neck: Neck supple.  Cardiovascular: Normal rate, regular rhythm and intact distal pulses.  Pulmonary/Chest: Effort normal and breath sounds normal.  Abdominal: Soft. Bowel sounds are normal. She exhibits no distension. There is no tenderness.  Musculoskeletal:       Right lower leg: Normal. She exhibits no tenderness and no edema.       Left lower leg: Normal. She exhibits no tenderness and no edema.  Neurological: She is alert and oriented to person, place, and time.  Mental status and speech are normal.  All movements are coordinated purposeful symmetric.  Skin: Skin is  warm and dry.  Psychiatric: She has a normal mood and affect. Her behavior is normal.     ED Treatments / Results  Labs (all labs ordered are listed, but only abnormal results are displayed) Labs Reviewed  BASIC METABOLIC PANEL - Abnormal; Notable for the following components:      Result Value   Glucose, Bld 113 (*)    BUN 21 (*)    All other components within normal limits  CBC - Abnormal; Notable for the following components:   RBC 5.12 (*)    All other components within normal limits  BRAIN NATRIURETIC PEPTIDE  I-STAT TROPONIN, ED    EKG  EKG Interpretation  Date/Time:  Monday March 17 2017 07:21:46 EST Ventricular Rate:  91 PR Interval:  152 QRS Duration: 82 QT Interval:  348 QTC Calculation: 428 R Axis:   74 Text Interpretation:  Normal sinus rhythm Normal ECG agree. no change from previous Confirmed by Charlesetta Shanks [id number]) on 03/17/2017 9:18:23 AM       Radiology Dg Chest 2 View  Result Date: 03/17/2017 CLINICAL DATA:  Persistent cough and respiratory difficulty for the past 4 months attributed to asthma and allergy. Former smoker years ago. EXAM: CHEST  2 VIEW COMPARISON:  Chest x-ray of August 08, 2015 FINDINGS: The lungs are adequately inflated. The interstitial markings are minimally prominent. There is no alveolar infiltrate or pleural effusion. The heart and pulmonary vascularity are normal. The mediastinum is normal in width. There is calcification in the wall of the thoracic aorta. There is multilevel degenerative disc disease of the thoracic spine. IMPRESSION: Mild stable interstitial prominence likely reflects reactive airway disease-chronic bronchitis. No acute cardiopulmonary abnormality. Thoracic aortic atherosclerosis. Electronically Signed   By: David  Martinique M.D.   On: 03/17/2017 08:01    Procedures Procedures (including critical care time)  Medications Ordered in ED Medications  benzonatate (TESSALON) capsule 200 mg (200 mg Oral Given 03/17/17  1005)     Initial Impression / Assessment and Plan / ED Course  I have reviewed the triage vital signs and the nursing notes.  Pertinent labs & imaging results that were available during my care of the patient were reviewed by me and  considered in my medical decision making (see chart for details).     Final Clinical Impressions(s) / ED Diagnoses   Final diagnoses:  Cough  Acute midline thoracic back pain  Non-seasonal allergic rhinitis, unspecified trigger   Patient is alert and nontoxic without respiratory distress.  Clinically she is well in appearance.  She has had long-term symptoms of sneezing, nasal congestion and cough that have been previously diagnosed as allergic in origin.  She is on a daily PPI and no ace inhibitors.  While in the room she has intermittent dry cough with no respiratory distress and otherwise normal pulmonary exam.  Diagnostic evaluation today does not suggest other etiology.  Patient reports in the past she has gotten improvement of symptoms with Magic mouthwash.  We will have the patient try Ladona Ridgel and recommend follow-up with PCP and possibly repeat evaluation with ENT. ED Discharge Orders    None       Charlesetta Shanks, MD 03/17/17 1029

## 2017-03-27 ENCOUNTER — Encounter: Payer: Self-pay | Admitting: Nurse Practitioner

## 2017-03-27 ENCOUNTER — Ambulatory Visit: Payer: PPO | Admitting: Nurse Practitioner

## 2017-03-27 VITALS — BP 152/88 | HR 89 | Temp 98.2°F | Resp 18 | Ht 64.0 in | Wt 192.0 lb

## 2017-03-27 DIAGNOSIS — J4520 Mild intermittent asthma, uncomplicated: Secondary | ICD-10-CM | POA: Diagnosis not present

## 2017-03-27 DIAGNOSIS — R05 Cough: Secondary | ICD-10-CM

## 2017-03-27 DIAGNOSIS — Z86010 Personal history of colonic polyps: Secondary | ICD-10-CM | POA: Diagnosis not present

## 2017-03-27 DIAGNOSIS — J324 Chronic pansinusitis: Secondary | ICD-10-CM | POA: Diagnosis not present

## 2017-03-27 DIAGNOSIS — R059 Cough, unspecified: Secondary | ICD-10-CM

## 2017-03-27 MED ORDER — ALBUTEROL SULFATE HFA 108 (90 BASE) MCG/ACT IN AERS: 2.0000 | INHALATION_SPRAY | RESPIRATORY_TRACT | 1 refills | Status: DC | PRN

## 2017-03-27 MED ORDER — BENZONATATE 100 MG PO CAPS: 100.0000 mg | ORAL_CAPSULE | Freq: Three times a day (TID) | ORAL | 2 refills | Status: DC | PRN

## 2017-03-27 NOTE — Progress Notes (Signed)
Careteam: Patient Care Team: Lauree Chandler, NP as PCP - General (Nurse Practitioner)  Advanced Directive information Does Patient Have a Medical Advance Directive?: No, Would patient like information on creating a medical advance directive?: No - Patient declined  Allergies  Allergen Reactions  . Ace Inhibitors   . Amoxicillin     REACTION: unknown? pain in right kidney  . Benicar Hct [Olmesartan Medoxomil-Hctz]     Extreme weakness  . Budesonide-Formoterol Fumarate     Causes tremors and numbness  . Chlorzoxazone [Chlorzoxazone]   . Citalopram Hydrobromide     REACTION: hives  . Citalopram Hydrobromide   . Clonidine Derivatives   . Droperidol     REACTION: hives  . Flexeril [Cyclobenzaprine Hcl]   . Ketorolac Tromethamine     REACTION: hives  . Ketorolac Tromethamine   . Metoclopramide Hcl   . Mometasone Furo-Formoterol Fum     Causes sore throat, blurred vision and unable to sleep--started on med 09-17-10  . Morphine     REACTION: hives and itching  . Olmesartan Medoxomil     REACTION: fatique  . Breo Ellipta [Fluticasone Furoate-Vilanterol] Itching    Pt reports itching with [employer] is related to being lactose intolerant.     Chief Complaint  Patient presents with  . Follow-up    Pt is being seen for an ED follow up. Pt was seen at [facility] on 03/17/17 due to cough and SOB. Pt reports she did not take the levaquin that was Rx at last office appt due to potential side effects listed on info sheets.   . Medication Refill    would like a refill on tessalon perles and magic mouthwash     HPI: Patient is a 76 y.o. female seen in the office today to follow up ED visit. Pt was seen in office 02/28/2017 due to chronic sinusitis and was placed on levaquin however due to possible side effects did not wish to take this medication. Pt then went to the ED on 03/17/17 due to cough. Pt was given tessalon perles (which was very beneficial) and magic mouthwash (apparently this had  helped in the past).  When she talks she starts coughing which will have her more short of breath. Pt has not seen ENT recently  conts on Nexium occasional GERD symptoms but overall this is controlled.  Also having increase sneezing. Using netipot regularly.  A lot of allergens surrounding her at her appt but she can not afford to move.  Still has not gotten her allergy shots.  Has been using her albuterol which has been helping and hot tea   Review of Systems:  Review of Systems  Constitutional: Negative for chills and fever.  HENT: Positive for congestion and sore throat. Negative for ear discharge, ear pain and sinus pain.        Post nasal drip   Eyes: Negative for blurred vision, pain, discharge and redness.  Respiratory: Positive for cough. Negative for sputum production and shortness of breath.   Cardiovascular: Negative for chest pain and palpitations.  Genitourinary: Positive for frequency (at night). Negative for dysuria.  Neurological: Positive for headaches (better). Negative for dizziness.  Psychiatric/Behavioral: Negative for depression and suicidal ideas. The patient is nervous/anxious and has insomnia (increase anxiety).     Past Medical History:  Diagnosis Date  . ALLERGIC RHINITIS   . Anxiety   . Asthma   . Blood type O+   . Cervical strain, acute   . Colon  polyp   . Diverticulosis of colon   . Dizziness   . Fibromyalgia   . GERD (gastroesophageal reflux disease)   . History of nephrolithiasis   . Hypercholesterolemia    borderline  . Hypertension   . IBS (irritable bowel syndrome)   . Osteoarthritis   . Personal history of allergy to unspecified medicinal agent   . Postconcussion syndrome   . Vitamin D deficiency    Past Surgical History:  Procedure Laterality Date  . CHOLECYSTECTOMY, LAPAROSCOPIC  2004   for gallstone pancreatitis  . KNEE SURGERY Right   . KNEE SURGERY Left   . THYROID SURGERY     Social History:   reports that she quit  smoking about 54 years ago. Her smoking use included cigarettes. She has a 16.00 pack-year smoking history. she has never used smokeless tobacco. She reports that she does not drink alcohol or use drugs.  Family History  Problem Relation Age of Onset  . Breast cancer Mother   . Alzheimer's disease Mother   . Heart disease Mother   . COPD Brother   . Heart disease Sister   . Breast cancer Sister   . Alcohol abuse Sister   . Ovarian cancer Paternal Grandmother   . Arthritis Other        Cousins   . COPD Cousin        Maternal side   . Heart attack Maternal Uncle     Medications:   Medication List        Accurate as of 03/27/17 10:15 AM. Always use your most recent med list.          acetaminophen 500 MG tablet Commonly known as:  TYLENOL   ADVAIR HFA 115-21 MCG/ACT inhaler Generic drug:  fluticasone-salmeterol   albuterol 108 (90 Base) MCG/ACT inhaler Commonly known as:  PROVENTIL HFA;VENTOLIN HFA Inhale 2 puffs into the lungs every 4 (four) hours as needed for wheezing or shortness of breath. RESCUE   amLODipine 5 MG tablet Commonly known as:  NORVASC TAKE 1 TABLET BY MOUTH EVERY DAY FOR BLOOD PRESSURE   aspirin 81 MG EC tablet   benzonatate 100 MG capsule Commonly known as:  TESSALON Take 1 capsule (100 mg total) every 8 (eight) hours by mouth.   CENTRUM SILVER PO   esomeprazole 40 MG capsule Commonly known as:  NEXIUM TAKE 1 CAPSULE (40 MG TOTAL) BY MOUTH DAILY.   FISH OIL MAXIMUM STRENGTH 1200 MG Caps   fluticasone 50 MCG/ACT nasal spray Commonly known as:  FLONASE Shake liquid and use one to two sprays in each nostril twice daily   hydrochlorothiazide 12.5 MG capsule Commonly known as:  MICROZIDE TAKE 1 CAPSULE BY MOUTH EVERY DAY   magic mouthwash Soln Take 5 mLs 3 (three) times daily as needed by mouth for mouth pain.   meclizine 25 MG tablet Commonly known as:  ANTIVERT TAKE 1 TABLET 3 TIMES A DAY AS NEEDED FOR DIZZINESS/NAUSEA   potassium  chloride SA 20 MEQ tablet Commonly known as:  KLOR-CON M20 Take 1 tablet (20 mEq total) by mouth daily.   SALINE NASAL SPRAY NA   SYSTANE BALANCE 0.6 % Soln Generic drug:  Propylene Glycol   Vitamin D3 1000 units Caps   ZYRTEC ALLERGY 10 MG Caps Generic drug:  Cetirizine HCl        Physical Exam:  Vitals:   03/27/17 1012  BP: (!) 152/88  Pulse: 89  Resp: 18  Temp: 98.2 F (36.8 C)  TempSrc: Oral  SpO2: 97%  Weight: 192 lb (87.1 kg)  Height: 5' 4"  (1.626 m)   Body mass index is 32.96 kg/m.  Physical Exam  Constitutional: She appears well-developed and well-nourished. No distress.  HENT:  Head: Normocephalic and atraumatic.  Right Ear: External ear normal.  Left Ear: External ear normal.  Nose: Rhinorrhea present.  Mouth/Throat: Mucous membranes are normal. Posterior oropharyngeal erythema (no exudate) present. No oropharyngeal exudate.  Eyes: Conjunctivae and EOM are normal. Pupils are equal, round, and reactive to light. Right eye exhibits no discharge. Left eye exhibits no discharge.  Neck: Normal range of motion. Neck supple.  Cardiovascular: Normal rate, regular rhythm and normal heart sounds.  Pulmonary/Chest: Effort normal and breath sounds normal. No respiratory distress. She has no wheezes.  Dry cough  Skin: Skin is warm and dry. She is not diaphoretic.  Psychiatric: She has a normal mood and affect.    Labs reviewed: Basic Metabolic Panel: Recent Labs    07/02/16 0834 12/31/16 0851 03/17/17 0722  NA 140 140 139  K 3.8 4.2 4.4  CL 104 102 104  CO2 23 25 26   GLUCOSE 128* 117* 113*  BUN 22 31* 21*  CREATININE 0.87 0.73 0.75  CALCIUM 9.7 9.7 9.5   Liver Function Tests: Recent Labs    07/02/16 0834 12/31/16 0851  AST 28 23  ALT 25 24  ALKPHOS 79 98  BILITOT 0.5 0.6  PROT 7.0 6.7  ALBUMIN 4.3 4.2   No results for input(s): LIPASE, AMYLASE in the last 8760 hours. No results for input(s): AMMONIA in the last 8760 hours. CBC: Recent  Labs    12/31/16 0851 03/17/17 0722  WBC 8.9 8.6  NEUTROABS 4,005  --   HGB 14.4 15.0  HCT 42.7 43.9  MCV 85.9 85.7  PLT 318 287   Lipid Panel: Recent Labs    07/02/16 0834 12/31/16 0851  CHOL 214* 206*  HDL 47* 54  LDLCALC 136* 125*  TRIG 155* 133  CHOLHDL 4.6 3.8   TSH: No results for input(s): TSH in the last 8760 hours. A1C: Lab Results  Component Value Date   HGBA1C 5.7 (H) 12/31/2016     Assessment/Plan 1. Chronic pansinusitis -overall this has improved, pt reports headache, pressure in her ears have improved.  - Ambulatory referral to ENT for further evaluation due to chronic sinusitis and persistent symptoms -to cont antihistamine, netipot, nasal saline, flonase  2. Cough -ongoing-responds to benzonatate and albuterol.  -recommend follow up with pulmonary due to persistent cough. She does not feel like she is wheezing but has episodes of shortness of breath.  - benzonatate (TESSALON) 100 MG capsule; Take 1 capsule (100 mg total) 3 (three) times daily as needed by mouth for cough.  Dispense: 60 capsule; Refill: 2 - albuterol (PROVENTIL HFA;VENTOLIN HFA) 108 (90 Base) MCG/ACT inhaler; Inhale 2 puffs every 4 (four) hours as needed into the lungs for wheezing or shortness of breath. RESCUE  Dispense: 3 Inhaler; Refill: 1 - Ambulatory referral to Gastroenterology -added mucinex DM BID with full glass of water.   3. Asthma, allergic, mild intermittent, uncomplicated -ongoing, unable to get allergy injections due to sinusitis and cough. conts on advair. Pulmonary follow up recommended.  - albuterol (PROVENTIL HFA;VENTOLIN HFA) 108 (90 Base) MCG/ACT inhaler; Inhale 2 puffs every 4 (four) hours as needed into the lungs for wheezing or shortness of breath. RESCUE  Dispense: 3 Inhaler; Refill: 1 -to cont antihistamine  4. History of colon polyps - Ambulatory  referral to Gastroenterology for follow up for colonoscopy.   Next appt: as schedule.  Carlos American. Harle Battiest  [facility] [phone] 8 am - 5 pm) [phone] (after hours)

## 2017-03-27 NOTE — Patient Instructions (Addendum)
Make sure you are  Using Antihistamine (Allegra, Claritin, or Zyrtek)  Using the SALINE Nasal Mist regularly by spraying your nose every 1-2H to keep the passages clear Using the [facility] twice daily    To use MUCINEX DM  twice daily w/ lots of water    Continue the Advair twice daily Refill provided for Tessalon Perles   To keep follow up.   You need to schedule apt with ENT to follow up on sinusitis  Dr. Leane Para. Redmond Baseman, MD Address: [address], [place], [place], [place] [address] Phone: [phone]

## 2017-04-01 ENCOUNTER — Telehealth: Payer: Self-pay

## 2017-04-01 NOTE — Telephone Encounter (Signed)
I called patient to let her know that she does not need the 1 week follow up appointment that she is scheduled for on 04/02/17. Per Janett Billow, pt is to follow up after seeing pulmonary on 04/07/17.   I left a message for patient to call the office.

## 2017-04-01 NOTE — Telephone Encounter (Signed)
I spoke with patient and she agreed that she did not need a 1 week follow up. She will call if any concerns arise.   1 week follow up appointment has been cancelled.

## 2017-04-02 ENCOUNTER — Ambulatory Visit: Payer: PPO | Admitting: Nurse Practitioner

## 2017-04-03 ENCOUNTER — Encounter: Payer: Self-pay | Admitting: Pulmonary Disease

## 2017-04-03 NOTE — Progress Notes (Signed)
Subjective:    Patient ID: [name], female    DOB: Nov 30, 1940, 76 y.o.   MRN: [id number]  HPI 76 y/o WF returns after a long hiatus for a follow up visit... he has multiple medical problems as noted below... Her PCP is now Sherrie Mustache, NP at [facility]...  ~  April 25, 2011:  54moROV & she is c/o head & chest congestion w/ cough & sm amt clear sputum; she thinks very sens to odors & she has been eval by DrYoung for allergy testing: so far IgE & RAST tests are neg & she is awaiting skin tests;  PFT 12/12 showed FVC=2.68 (96%), FEV1=2.18 (111%), mid-flows=108%pred; she also had Lung volumes= wnl and DLCO= wnl...  She is also followed in [facility] on combination of diet, exercise, & Fish Oil w/ some improvement noted...  ~  Oct 08, 2011:  571moOV & Sherran has had a good interval- allergy symptoms better on the vaccine from [employer] no major difficulty this spring;  She saw the folks in the [facility] on diet, lifestyle mod, etc> they have rec medication for her Chol but she was reluctant- we reviewed & she has agreed to trial CRESTOR 9m49m.  She also admits to mod anxiety, stress, etc & would like to try something to help but didn't like alpraz in past so we discussed trial of KLONOPIN 0.9mg56md regularly... Finally we note that her BP is sl elev but she stopped prev HCT 12.9mg 52mch was used for dizziness (Meniere's) and her BP> rec that she restart the HCTZ 12.9mg w54m20 daily... We reviewed prob list, meds, xrays and labs> see below>>  ~  December 31, 2011:  52mo RO30moadd-on appt at pt request for URI> she is c/o nasal congestion, drainage, sneezing, & yellow mucus; she thinks "it's the dampness" but notes that allergy shots are really helping> she had IgE level 13-14, totally neg RAST tests but pos scratch tests to trees, weeds, dust mites, & mold;  She has mult food & medication intolerances... We decided to check CXR (clear); and treat w/ Depo/ Pred/ ZPak/  Mucinex/ Fluids/ Delsym...    She is also c/o urinary symptoms & wants a Urinalysis (clear, C&S- neg).    We reviewed prob list, meds, xrays and labs> see below >>   ~  July 02, 2012:  40mo ROV67moondra has done well w/ her CC being early CTS in the AMs- we discussed the etiology & offered further eval w/ NCVs vs Rx w/ wrist splints Qhs & she will try the latter... We reviewed the following medical problems during today's office visit >>     AR, Asthma> on Zyrtek, Flonase, Advair250, ProairHFA; off prev Pred- breathing is at baseline w/o cough, sput, hemoptysis, SOB, edema, etc; she is working to avoid exac...    HBP> on Amlod5, Hct12.5, K20;  BP= 140/70 & she denies CP, palpit, dizzy, SOB, edema; rec to incr exercise program...    Lipids> on Fish Oil, she is INTOL to all statins etc;  FLP 2/14 shows TChol 191, TG 275, HDL 38, LDL 103 & we discussed a better low fat diet...    GI- GERD, Divertics, IBS, Polyps> on Nexium40; she denies abd pain, n/v, c/d, blood seen; last colon was 3/10 by DrKaplan w/ adenomatous polyp removed; f/u 25yrs.   62yrKidney stones> she passed a left renal stone in 2010 & saw DrOttelin; no recurrence...Marland KitchenMarland Kitchen  DJD, FM, VitD defic> on Tramadol50, OTC meds, MVI & VitD1000; she has seen DrOlin & told her knee is bone-on-bone but she doesn't want surg...    Anxiety> not currently on anxiolytic rx... We reviewed prob list, meds, xrays and labs> see below for updates >> she had the 2013 Flu vaccine 10/13... LABS 2/14:  FLP- chol looks ok but TG=275 & rec low fat diet;  Chems- wnl;  CBC- wnl;  TSH=1.00;  VitD=49...  ~  January 01, 2013:  56moROV & SYmanigot REALLY motivated and lost 32# over the last 615mogreat job on diet & exercise!!! She is feeling better, less FM pain, less arthritic complaints, and feeling much better;  She had right groin rash & L1 distrib shingles eruption last week- we called in Famvir, Medrol, & she is much improved...  We reviewed the following medical  problems during today's office visit >>     AR, Asthma> on Zyrtek, Flonase, allergy shots, Advair250, ProairHFA; breathing is at baseline w/o cough, sput, hemoptysis, SOB, edema, etc...     HBP> on Amlod5, Hct12.5, K20;  BP= 140/80 & she denies CP, palpit, dizzy, SOB, edema; improving w/ diet, exercise, & wt loss...    Lipids> on Fish Oil & diet, she is INTOL to all statins etc;  FLP 8/14 after wt loss shows TChol 182, TG 120, HDL 49, LDL 110     GI- GERD, Divertics, IBS, Polyps> on Nexium40; she denies abd pain, n/v, c/d, blood seen; last colon was 3/10 by DrKaplan w/ adenomatous polyp removed; f/u 5y28yr   Hx Kidney stones> she passed a left renal stone in 2010 & saw DrOttelin; no recurrence...    DJD, FM, VitD defic> on Tramadol50, OTC meds, MVI & VitD1000; she has seen DrOlin & told her knee is bone-on-bone but she doesn't want surg; she got on diet 2014, lost weight, incr exercise & knees are much better!    Anxiety> not currently on anxiolytic rx... We reviewed prob list, meds, xrays and labs> see below for updates >>  LABS 8/14:  FLP- improved & at goals x LDL=110...    ~  July 29, 2016:  3.5 year ROV & pulmonary follow up visit... SonReiannaturns after a long hiatus- [facility]  SonTeleshalls me that she's had lots of sinus, hay fever, and allergy symptoms ever since she had to stop allergy shots in the fall of 2017 (says she was having reactions & therefore they stopped the shots);  She has continued to use her Zyrtek, Flonase, & Advair;  She has been treated by her PCP w/ antibiotics, Prednisone, & Mucinex for these persistent symptoms; she notes that her eyes burn, sneezes, dry cough, but denies sputum/ hemoptysis/ SOB/ CP/ f/c/s etc... She has been in contact w/ DrYoung & his notes and phone messages reviewed...  In addition she's had some reflux symptoms & restarted her Nexium... We reviewed the following medical  problems during today's office visit >>     AR, Asthma> on Zyrtek, Flonase, [phone] ProairHFA; she is c/o incr allergy symptoms since she quit allergy shots in [place] dry cough, no sput/ wheezing/ SOB/ etc...    HBP> on Amlod5, Hct12.5, K20;  BP= 122/74 & she denies CP, palpit, dizzy, SOB, edema; improving w/ diet, exercise, & wt loss...    Lipids> on Fish Oil & diet, she is INTOL to all statins etc;  FLP  2/18 showed TChol 214, TG 155, HDL 47, LDL 136=> needs better diet & wt reduction...     GI- GERD, Divertics, IBS, Polyps> on Nexium40; she denies abd pain, n/v, c/d, blood seen; last colon was 3/10 by DrKaplan w/ adenomatous polyp removed; she is overdue for f/u 32yr.    Hx Kidney stones> she passed a left renal stone in 2010 & saw DrOttelin; no recurrence...    DJD, FM, VitD defic> on OTC meds, MVI & VitD1000; she has seen DrOlin & told her knee is bone-on-bone but she doesn't want surg; she got on diet 2014, lost weight, incr exercise & knees are much better!    Anxiety> not currently on anxiolytic rx... EXAM shows Afeb, VSS, O2sat=97% on RA;  HEENT- neg, mallampati2;  Chest- clear w/o w/r/r;  Heart- RR gr1/6 SEM w/o r/g;  Abd- soft, nontender, neg;  Ext- ok w/o c/c/e;  Neuro- intact...  Last CXR 08/08/15 showed norm heart size, clear lungs- NAD, Tspine spondylosis...  Last LABS 06/2016>  Chems- wnl x BS=128, A1c=5.7;  FLP- ok w/ TChol=214, LDL=136;  Last CBC 11/17 showed Hg=14.5, WBC=8.6 w/ 2% eos... IMP/PLAN>>  We reviewed need for diet/ exercise/ wt reduction;  She will continue w/ Antihist Qam, Flonase- 2sp each nostril Bid, Saline nasal mist Q1h as needed, and the Advair250Bid;  Add Mucinex 12097mbid w/ fluids...          Problem List:    ALLERGIC RHINITIS (ICD-477.9) - she uses OTC Claritin, Saline and FLONASE... she really likes the "netti pot"... ~  2012: Allergy eval by DrYoung w/ neg IgE & RAST and normal PFTs; skin testing was pos for trees, weeds, dust mites, molds;  she was started on vaccine & states she's had an easy spring & feels the shots are helping. ~  8/12:  She was treated by TP for acute sinusitis & had Sinus XRays that were neg... ~  8/13:  Presents w/ URI, sinusitis> treated w/ Depo/ Pred/ ZPak etc... ~  8/14:  She reports MUCH IMPROVED on meds and allergy shots...  ASTHMA (ICD-493.90) - stable on ADVAIR 250Bid... Intol Dulera w/ sore throat & insomnia. ~  CXR 3/11 showed hypoaeration, basilar atx, NAD. ~  CXR 10/11 showed clear, DJD spine, NAD. ~  PFTs 12/12 showed FVC=2.68 (96%), FEV1=2.18 (111%), mid-flows=108%pred; she also had Lung volumes= wnl and DLCO= wnl... ~  12/12: presents w/ AB exac & treated w/ Depo/ Dosepak, Mucinex, Fluids, Hycodan... ~  CXR 8/13 showed normal heart size, clear lungs, NAD... ~  2/14: on Zyrtek, Flonase, Advair250, ProairHFA; off prev Pred- breathing is at baseline w/o cough, sput, hemoptysis, SOB, edema, etc; she is working to avoid exac ~  8/14: she has lost substantial weight on diet & exercise, feeling better, breathing better, continues on Advair250Bid & prn Proair...  HYPERTENSION (ICD-401.9) - controlled on NORVASC 65m28m, HCTZ 12.65mg52m KCl 20mE18mily... ~  12/12:  BP= 132/780 & BP's even better at home; denies HA, visual changes, CP, palipit, syncope, dyspnea, edema, etc... ~  5/13:  BP= 160/72 off of the diuretic which she was taking primarily for dizziness (?Meniere's); I explained how this is mainly used as BP med and she will re-start Rx. ~  8/13:  BP= 130/76 & she denies CP, palpit, SOB, edema... ~  2/14: on Amlod5, Hct12.5, K20;  BP= 140/70 & she denies CP, palpit, dizzy, SOB, edema; rec to incr exercise program.  ~  8/14: on Amlod5, Hct12.5, K20;  BP= 140/80 & she  denies CP, palpit, dizzy, SOB, edema; improving w/ diet, exercise, & wt loss  HYPERCHOLESTEROLEMIA, BORDERLINE (ICD-272.4) - on diet + exercise, doesn't want meds. ~  [place] 1/08 showed TChol 220, TG 279, HDL 43, LDL 122 ~  FLP 6/10 showed  TChol 204, TG 167, HDL 43, LDL 123... needs better diet, get wt down. ~  FLP 6/11 showed TChol 221, TG 385, HDL 40, LDL 116... must get wt down! discussed low chol, low fat. ~  FLP 6/12 showed TChol 231, TG 187, HDL 44, LDL 149... Looks worse despite diet efforts, refer to [facility] has been working w/ her on a combination of Diet, Exercise, Fish Oil... ~  [place] 11/12 on lifestyle mod showed TChol 206, TG 311, HDL 45, LDL 121... Needs better low fat diet. ~  FLP 2/13 on lifestyle mod showed TChol 242, TG 157, HDL 45, LDL 165... We decided to start [place]. ~  [place] 2/14 on diet alone showed TChol 191, TG 275, HDL 38, LDL 103; she is INTOL to all Chol meds... ~  FLP 8/14 on diet alone after 32# wt loss showed TChol 182, TG 120, HDL 49, LDL 110  GERD (ICD-530.81) - prev on Protonix but insurance won't pay, therefore switch to [place] 56m/d... ?Omep caused diarrhea. ~  last EGD 9/06 showed esophagitis...  Hx Gallstones -  s/p ERCP w/ sphincterotomy 12/04... and subsequent cholecystectomy...  DIVERTICULOSIS OF COLON (ICD-562.10) IBS (ICD-564.1) COLONIC POLYPS (ICD-211.3) ~  colonoscopy 12/00 showed divertics, hems, colon polyp- adenomatous...  ~  f/u colonoscopy 3/10 by DrKaplan w/ polyp removed- adenomatous, f/u planned 5 yrs.  Hx of NEPHROLITHIASIS (ICD-592.0) - went to ER 4/10 w/ left flank pain & CT showed mild hydroneph & kidney stone... passed it on her own & followed by Urology- DrOttelin... seen 11/11 w/ right flank pain & neg eval (exam w/ tender ribs on palp),  OSTEOARTHRITIS (ICD-715.90) - she has some left hip & R>L knee discomfort...  uses Tylenol for pain... she notes prev right knee arthroscopy in 1999 by DrRendall... Rx w/ VICODIN Prn. ~  6/11:  we discussed ortho referral & will set up refer to DrOlin per request. ~  6/12:  She notes LBP w/ prev XRays showing advanced degen arthritis but she declines referral to Ortho. ~  2/14: on Tramadol50, OTC meds, MVI &  VitD1000; she has seen DrOlin & told her knee is bone-on-bone but she doesn't want surg. ~  8/14: she reports that her arthritic complaints and FM are improved after her wt loss...  FIBROMYALGIA (ICD-729.1) - takes Calcium, MVI, Vit D, & now TYLENOL Prn... hx mult somatic complaints.  VITAMIN D DEFICIENCY (ICD-268.9) ~  labs 6/10 showed Vit D level= 21... rec> Vit D 1000 u daily. ~  labs 3/11 showed Vit D level= 35... continue same. ~  Labs 2/14 showed Vit D level = 49  POSTCONCUSSION SYNDROME (ICD-310.2) - from [facility]12/08, fully recovered & back to baseline. CERVICAL STRAIN, ACUTE (ICD-847.0) - "I do exercises and have a good pillow"... DIZZINESS (ICD-780.4) - uses MECLIZINE 223mPrn...  ANXIETY (ICD-300.00) - INTOL Ambien and Lorazepam w/ nightmares... ~  5/13:  She is under a lot of stress she says & has agreed to try medication to help; rec> KLONOPIN 0.15m78mid... ~  2/14: she does not list any anxiolytic med at this time...  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   Past Surgical  History:  Procedure Laterality Date  . CHOLECYSTECTOMY, LAPAROSCOPIC  2004   for gallstone pancreatitis  . KNEE SURGERY Right   . KNEE SURGERY Left   . THYROID SURGERY      Outpatient Encounter Medications as of 07/29/2016  Medication Sig  . acetaminophen (TYLENOL) 500 MG tablet Take 500 mg by mouth every 6 (six) hours as needed (for headache as needed).   Marland Kitchen aspirin 81 MG EC tablet Take 81 mg by mouth daily.    . Cetirizine HCl (ZYRTEC ALLERGY) 10 MG CAPS Take 1 capsule by mouth daily.    . Cholecalciferol (VITAMIN D3) 1000 UNITS CAPS Take by mouth daily.    . meclizine (ANTIVERT) 25 MG tablet TAKE 1 TABLET 3 TIMES A DAY AS NEEDED FOR DIZZINESS/NAUSEA  . Multiple Vitamins-Minerals (CENTRUM SILVER PO) Take by mouth daily.    . Omega-3 Fatty Acids (FISH OIL MAXIMUM STRENGTH) 1200 MG CAPS Take by mouth. 2 by mouth  daily  . Propylene Glycol (SYSTANE BALANCE) 0.6 % SOLN Apply 1 drop to eye 2 (two) times daily.  Marland Kitchen SALINE NASAL SPRAY NA Place 88 mcg into the nose 2 (two) times daily.  . [DISCONTINUED] albuterol (PROVENTIL HFA;VENTOLIN HFA) 108 (90 Base) MCG/ACT inhaler Inhale 2 puffs into the lungs every 4 (four) hours as needed for wheezing or shortness of breath. RESCUE  . [DISCONTINUED] amLODipine (NORVASC) 5 MG tablet Take 1 tablet (5 mg total) by mouth daily. for blood pressure  . [DISCONTINUED] esomeprazole (NEXIUM) 40 MG capsule Take 1 capsule (40 mg total) by mouth daily.  . [DISCONTINUED] fluticasone (FLONASE) 50 MCG/ACT nasal spray SHAKE LIQUID AND USE 1 TO 2 SPRAYS IN EACH NOSTRIL TWICE DAILY  . [DISCONTINUED] Fluticasone-Salmeterol (ADVAIR DISKUS) 250-50 MCG/DOSE AEPB Inhale 1 puff into the lungs 2 (two) times daily.  . [DISCONTINUED] hydrochlorothiazide (MICROZIDE) 12.5 MG capsule TAKE 1 CAPSULE BY MOUTH EVERY DAY  . [DISCONTINUED] potassium chloride SA (K-DUR,KLOR-CON) 20 MEQ tablet Take 1 tablet (20 mEq total) by mouth daily.   No facility-administered encounter medications on file as of 07/29/2016.     Allergies  Allergen Reactions  . Ace Inhibitors   . Amoxicillin     REACTION: unknown? pain in right kidney  . Benicar Hct [Olmesartan Medoxomil-Hctz]     Extreme weakness  . Budesonide-Formoterol Fumarate     Causes tremors and numbness  . Chlorzoxazone [Chlorzoxazone]   . Citalopram Hydrobromide     REACTION: hives  . Citalopram Hydrobromide   . Clonidine Derivatives   . Droperidol     REACTION: hives  . Flexeril [Cyclobenzaprine Hcl]   . Ketorolac Tromethamine     REACTION: hives  . Ketorolac Tromethamine   . Metoclopramide Hcl   . Mometasone Furo-Formoterol Fum     Causes sore throat, blurred vision and unable to sleep--started on med 09-17-10  . Morphine     REACTION: hives and itching  . Olmesartan Medoxomil     REACTION: fatique  . Breo Ellipta [Fluticasone  Furoate-Vilanterol] Itching    Pt reports itching with Memory Dance is related to being lactose intolerant.     Current Medications, Allergies, Past Medical History, Past Surgical History, Family History, and Social History were reviewed in [employer] record.   Review of Systems        See HPI - all other systems neg except as noted... The patient complains of decreased hearing, dyspnea on exertion, headaches, muscle weakness, and difficulty walking.  The patient denies anorexia, fever, weight loss, weight  gain, vision loss, hoarseness, chest pain, syncope, peripheral edema, prolonged cough, hemoptysis, abdominal pain, melena, hematochezia, severe indigestion/heartburn, hematuria, incontinence, suspicious skin lesions, transient blindness, depression, unusual weight change, abnormal bleeding, enlarged lymph nodes, and angioedema.     Objective:   Physical Exam     WD, WN, 76 y/o WF in NAD... Great job w/ wt reduction. GENERAL:  Alert & oriented; pleasant & cooperative... HEENT:  [place]/AT, EOM-wnl, PERRLA, EACs-clear, TMs-wnl, NOSE: sl pale, no exud; THROAT-clear & wnl. NECK:  Supple w/ fair ROM; no JVD; normal carotid impulses w/o bruits; no thyromegaly or nodules palpated; no lymphadenopathy. CHEST:  scat rhonchi & otherw clear, no rales or signs of consolidation... HEART:  Regular Rhythm; without murmurs/ rubs/ or gallops detected... ABDOMEN:  Soft & nontender; normal bowel sounds; no organomegaly or masses palpated... EXT: without deformities, mild arthritic changes; no varicose veins/ venous insuffic/ or edema. BACK:  she has numerous trigger point and tender spots thru the shoulders, back, chest etc... NEURO:  CN's intact; no focal neuro deficits... DERM:  No lesions noted; no rash etc...  RADIOLOGY DATA:  Reviewed in the EPIC EMR & discussed w/ the patient...  LABORATORY DATA:  Reviewed in the EPIC EMR & discussed w/ the patient...   Assessment & Plan:     07/29/16>   We reviewed need for diet/ exercise/ wt reduction;  She will continue w/ Antihist Qam, Flonase- 2sp each nostril Bid, Saline nasal mist Q1h as needed, and the Advair250Bid;  Add Mucinex 1219m bid w/ fluids   AR/AB>  Allergy eval by DrYoung w/ neg IgE & RAST and normal PFTs; skin testing was pos for trees, weeds, dust mites, molds; improved on vaccine & states she's had an easy yr so far & feels the shots are helping...  HBP>  BP is controlled on Norvasc & HCT; continue same + diet/ exercise/ wt reduction...  CHOL>  She is INTOL to all statins and on diet + FishOil alone; we reviewed low fat diet & exerc program=> great job!  GERD>  She notes that the Nexium really works for her...  DJD/ Back Pain/ FM>  On OTC meds as needed but she is much improved w/ her wt loss...  Anxiety>  She was intol to Lorazepam in the past, tried Klonopin, but doesn't stick w/ any med...  Mult medication sensitivities>  She has an impressive list of med reactions> currently numbering 162..     Medication List        Accurate as of 07/29/16 11:59 PM. Always use your most recent med list.          acetaminophen 500 MG tablet Commonly known as:  TYLENOL   albuterol 108 (90 Base) MCG/ACT inhaler Commonly known as:  PROVENTIL HFA;VENTOLIN HFA Inhale 2 puffs into the lungs every 4 (four) hours as needed for wheezing or shortness of breath. RESCUE   amLODipine 5 MG tablet Commonly known as:  NORVASC Take 1 tablet (5 mg total) by mouth daily. for blood pressure   aspirin 81 MG EC tablet   CENTRUM SILVER PO   esomeprazole 40 MG capsule Commonly known as:  NEXIUM Take 1 capsule (40 mg total) by mouth daily.   FISH OIL MAXIMUM STRENGTH 1200 MG Caps   fluticasone 50 MCG/ACT nasal spray Commonly known as:  FLONASE SHAKE LIQUID AND USE 1 TO 2 SPRAYS IN EACH NOSTRIL TWICE DAILY   Fluticasone-Salmeterol 250-50 MCG/DOSE Aepb Commonly known as:  ADVAIR DISKUS Inhale 1 puff into the lungs  2 (two) times daily.   hydrochlorothiazide 12.5 MG capsule Commonly known as:  MICROZIDE TAKE 1 CAPSULE BY MOUTH EVERY DAY   meclizine 25 MG tablet Commonly known as:  ANTIVERT TAKE 1 TABLET 3 TIMES A DAY AS NEEDED FOR DIZZINESS/NAUSEA   potassium chloride SA 20 MEQ tablet Commonly known as:  K-DUR,KLOR-CON Take 1 tablet (20 mEq total) by mouth daily.   SALINE NASAL SPRAY NA   SYSTANE BALANCE 0.6 % Soln Generic drug:  Propylene Glycol   Vitamin D3 1000 units Caps   ZYRTEC ALLERGY 10 MG Caps Generic drug:  Cetirizine HCl

## 2017-04-07 ENCOUNTER — Encounter: Payer: Self-pay | Admitting: Pulmonary Disease

## 2017-04-07 ENCOUNTER — Ambulatory Visit: Payer: PPO | Admitting: Pulmonary Disease

## 2017-04-07 VITALS — BP 130/84 | HR 85 | Ht 62.0 in | Wt 192.0 lb

## 2017-04-07 DIAGNOSIS — J302 Other seasonal allergic rhinitis: Secondary | ICD-10-CM | POA: Diagnosis not present

## 2017-04-07 DIAGNOSIS — K219 Gastro-esophageal reflux disease without esophagitis: Secondary | ICD-10-CM | POA: Diagnosis not present

## 2017-04-07 DIAGNOSIS — M159 Polyosteoarthritis, unspecified: Secondary | ICD-10-CM

## 2017-04-07 DIAGNOSIS — M150 Primary generalized (osteo)arthritis: Secondary | ICD-10-CM | POA: Diagnosis not present

## 2017-04-07 DIAGNOSIS — J4520 Mild intermittent asthma, uncomplicated: Secondary | ICD-10-CM | POA: Diagnosis not present

## 2017-04-07 DIAGNOSIS — K5730 Diverticulosis of large intestine without perforation or abscess without bleeding: Secondary | ICD-10-CM | POA: Diagnosis not present

## 2017-04-07 DIAGNOSIS — I10 Essential (primary) hypertension: Secondary | ICD-10-CM | POA: Diagnosis not present

## 2017-04-07 DIAGNOSIS — J3089 Other allergic rhinitis: Secondary | ICD-10-CM

## 2017-04-07 DIAGNOSIS — D126 Benign neoplasm of colon, unspecified: Secondary | ICD-10-CM

## 2017-04-07 DIAGNOSIS — F411 Generalized anxiety disorder: Secondary | ICD-10-CM | POA: Diagnosis not present

## 2017-04-07 DIAGNOSIS — E785 Hyperlipidemia, unspecified: Secondary | ICD-10-CM

## 2017-04-07 MED ORDER — METHYLPREDNISOLONE ACETATE 80 MG/ML IJ SUSP
80.0000 mg | Freq: Once | INTRAMUSCULAR | Status: AC
  Administered 2017-04-07: 80 mg via INTRAMUSCULAR

## 2017-04-07 MED ORDER — PREDNISONE 10 MG PO TABS: 10.0000 mg | ORAL_TABLET | Freq: Two times a day (BID) | ORAL | 0 refills | Status: DC

## 2017-04-07 NOTE — Progress Notes (Signed)
depo

## 2017-04-07 NOTE — Patient Instructions (Signed)
Today we updated your med list in our EPIC system...    Continue your current medications the same...  Today we gave you a Depomedrol shot & a new prescription for PREDNISONE 36m tabs to take as follows>>    Start in AM 11/27 w/ one tab twice daily for 5 days,    Then decrease to one tab each AM for 5 days,    Then decrease to 1/2 tab each AM for 5 days,    Then decrease to 1/2 tab every other day until gone...  I want you to get some cough drops or throat lozenges - wild cherry or honey flavored drops - & use them regularly to help your throat & suppress the cough...  Also take the OTC MUCINEX 12063mtwice daily w/ extra fluids...  Finally I want to adjust your reflux regimen>>    Switch the NEXIUM 404mo dinner time & take it about 30 min before the eve meal...    Do not eat or drink after dinner (this allows your stomach to totally empty before bedtime.    Take an OTC PEPCID ~68m4me tab about 1h before bedtime w/ a sip of water...    Elevate the head of your bed about 30 degrees...  Rest at home, call for any questions, let's plan a recheck in about 1 month

## 2017-04-08 ENCOUNTER — Encounter: Payer: Self-pay | Admitting: Pulmonary Disease

## 2017-04-08 ENCOUNTER — Encounter: Payer: Self-pay | Admitting: Gastroenterology

## 2017-04-08 ENCOUNTER — Telehealth: Payer: Self-pay | Admitting: Pulmonary Disease

## 2017-04-08 MED ORDER — PREDNISONE 20 MG PO TABS: ORAL_TABLET | ORAL | 0 refills | Status: DC

## 2017-04-08 NOTE — Progress Notes (Signed)
Subjective:    Patient ID: [name], female    DOB: 04-18-1941, 76 y.o.   MRN: [id number]  HPI 76 y/o WF returns after a long hiatus for a follow up visit... he has multiple medical problems as noted below... Her PCP is now Sherrie Mustache, NP at [facility]...  ~  April 25, 2011:  21moROV & she is c/o head & chest congestion w/ cough & sm amt clear sputum; she thinks very sens to odors & she has been eval by DrYoung for allergy testing: so far IgE & RAST tests are neg & she is awaiting skin tests;  PFT 12/12 showed FVC=2.68 (96%), FEV1=2.18 (111%), mid-flows=108%pred; she also had Lung volumes= wnl and DLCO= wnl...  She is also followed in the [facility] on combination of diet, exercise, & Fish Oil w/ some improvement noted...  ~  Oct 08, 2011:  56101moOV & Clyda has had a good interval- [facility] symptoms [facility] no major difficulty this spring;  She saw the folks in the [facility] on diet, lifestyle mod, etc> they have rec medication for her Chol but she was reluctant- we reviewed & she has agreed to trial CRESTOR 23m60m.  She also admits to mod anxiety, stress, etc & would like to try something to help but didn't like alpraz in past so we discussed trial of KLONOPIN 0.23mg48md regularly... Finally we note that her BP is sl elev but she stopped prev HCT 12.23mg 4mch was used for dizziness (Meniere's) and her BP> rec that she restart the HCTZ 12.23mg w41m20 daily... We reviewed prob list, meds, xrays and labs> see below>>  ~  December 31, 2011:  101mo [id number]-on appt at pt request for URI> she is c/o nasal congestion, drainage, sneezing, & yellow mucus; she thinks "it's the dampness" but notes that allergy shots are really helping> she had IgE level 13-14, totally neg RAST tests but pos scratch tests to trees, weeds, dust mites, & mold;  She has mult food & medication intolerances... We decided to check CXR (clear); and treat w/ Depo/ Pred/ ZPak/  Mucinex/ Fluids/ Delsym...    She is also c/o urinary symptoms & wants a Urinalysis (clear, C&S- neg).    We reviewed prob list, meds, xrays and labs> see below >>   ~  July 02, 2012:  43mo ROV64moondra has done well w/ her CC being early CTS in the AMs- we discussed the etiology & offered further eval w/ NCVs vs Rx w/ wrist splints Qhs & she will try the latter... We reviewed the following medical problems during today's office visit >>     AR, Asthma> on Zyrtek, Flonase, Advair250, ProairHFA; off prev Pred- breathing is at baseline w/o cough, sput, hemoptysis, SOB, edema, etc; she is working to avoid exac...    HBP> on Amlod5, Hct12.5, K20;  BP= 140/70 & she denies CP, palpit, dizzy, SOB, edema; rec to incr exercise program...    Lipids> on Fish Oil, she is INTOL to all statins etc;  FLP 2/14 shows TChol 191, TG 275, HDL 38, LDL 103 & we discussed a better low fat diet...    GI- GERD, Divertics, IBS, Polyps> on Nexium40; she denies abd pain, n/v, c/d, blood seen; last colon was 3/10 by DrKaplan w/ adenomatous polyp removed; f/u 61yrs.   28yrKidney stones> she passed a left renal stone in 2010 & saw DrOttelin; no recurrence...Marland KitchenMarland Kitchen  DJD, FM, VitD defic> on Tramadol50, OTC meds, MVI & VitD1000; she has seen DrOlin & told her knee is bone-on-bone but she doesn't want surg...    Anxiety> not currently on anxiolytic rx... We reviewed prob list, meds, xrays and labs> see below for updates >> she had the 2013 Flu vaccine 10/13... LABS 2/14:  FLP- chol looks ok but TG=275 & rec low fat diet;  Chems- wnl;  CBC- wnl;  TSH=1.00;  VitD=49...  ~  January 01, 2013:  48moROV & SSamanthangot REALLY motivated and lost 32# over the last 643mogreat job on diet & exercise!!! She is feeling better, less FM pain, less arthritic complaints, and feeling much better;  She had right groin rash & L1 distrib shingles eruption last week- we called in Famvir, Medrol, & she is much improved...  We reviewed the following medical  problems during today's office visit >>     AR, Asthma> on Zyrtek, Flonase, allergy shots, Advair250, ProairHFA; breathing is at baseline w/o cough, sput, hemoptysis, SOB, edema, etc...     HBP> on Amlod5, Hct12.5, K20;  BP= 140/80 & she denies CP, palpit, dizzy, SOB, edema; improving w/ diet, exercise, & wt loss...    Lipids> on Fish Oil & diet, she is INTOL to all statins etc;  FLP 8/14 after wt loss shows TChol 182, TG 120, HDL 49, LDL 110     GI- GERD, Divertics, IBS, Polyps> on Nexium40; she denies abd pain, n/v, c/d, blood seen; last colon was 3/10 by DrKaplan w/ adenomatous polyp removed; f/u 5y13yr   Hx Kidney stones> she passed a left renal stone in 2010 & saw DrOttelin; no recurrence...    DJD, FM, VitD defic> on Tramadol50, OTC meds, MVI & VitD1000; she has seen DrOlin & told her knee is bone-on-bone but she doesn't want surg; she got on diet 2014, lost weight, incr exercise & knees are much better!    Anxiety> not currently on anxiolytic rx... We reviewed prob list, meds, xrays and labs> see below for updates >>  LABS 8/14:  FLP- improved & at goals x LDL=110...   ~  July 29, 2016:  3.5 year ROV & pulmonary follow up visit... SonKaylanniturns after a long hiatus- [facility]  SonLynealls me that she's had lots of sinus, hay fever, and allergy symptoms ever since she had to stop allergy shots in the fall of 2017 (says she was having reactions & therefore they stopped the shots);  She has continued to use her Zyrtek, Flonase, & Advair;  She has been treated by her PCP w/ antibiotics, Prednisone, & Mucinex for these persistent symptoms; she notes that her eyes burn, sneezes, dry cough, but denies sputum/ hemoptysis/ SOB/ CP/ f/c/s etc... She has been in contact w/ DrYoung & his notes and phone messages reviewed...  In addition she's had some reflux symptoms & restarted her Nexium... We reviewed the following medical  problems during today's office visit >>     AR, Asthma> on Zyrtek, Flonase, [id number] ProairHFA; she is c/o incr allergy symptoms since she [place] allergy [place], no sput/ wheezing/ SOB/ etc...    HBP> on Amlod5, Hct12.5, K20;  BP= 122/74 & she denies CP, palpit, dizzy, SOB, edema; improving w/ diet, exercise, & wt loss...    Lipids> on Fish Oil & diet, she is INTOL to all statins etc;  FLP 2/18  showed TChol 214, TG 155, HDL 47, LDL 136=> needs better diet & wt reduction...     GI- GERD, Divertics, IBS, Polyps> on Nexium40; she denies abd pain, n/v, c/d, blood seen; last colon was 3/10 by DrKaplan w/ adenomatous polyp removed; she is overdue for f/u 45yr.    Hx Kidney stones> she passed a left renal stone in 2010 & saw DrOttelin; no recurrence...    DJD, FM, VitD defic> on OTC meds, MVI & VitD1000; she has seen DrOlin & told her knee is bone-on-bone but she doesn't want surg; she got on diet 2014, lost weight, incr exercise & knees are much better!    Anxiety> not currently on anxiolytic rx... EXAM shows Afeb, VSS, O2sat=97% on RA;  HEENT- neg, mallampati2;  Chest- clear w/o w/r/r;  Heart- RR gr1/6 SEM w/o r/g;  Abd- soft, nontender, neg;  Ext- ok w/o c/c/e;  Neuro- intact...  Last CXR 08/08/15 showed norm heart size, clear lungs- NAD, Tspine spondylosis...  Last LABS 06/2016>  Chems- wnl x BS=128, A1c=5.7;  FLP- ok w/ TChol=214, LDL=136;  Last CBC 11/17 showed Hg=14.5, WBC=8.6 w/ 2% eos... IMP/PLAN>>  We reviewed need for diet/ exercise/ wt reduction;  She will continue w/ Antihist Qam, Flonase- 2sp each nostril Bid, Saline nasal mist Q1h as needed, and the Advair250Bid;  Add Mucinex 12081mbid w/ fluids...   ~  April 07, 2017:  8-33m75moVFlagler Beachturns c/o mult upper resp symptoms including cough- drainage & reflux, small amt clear sput, no hemoptysis, feels SOB/ tight/ min wheezing which she is convinced is due to her weight; notes some chest discomfort due to cough  which she says is worse at night;  She also c/o headaches, heavy feeling, sneezing, netti pot helps;  She says she has been unable to get her allergy shots lately due to illness...     She last saw ENT-DrBates 10/15/16> c/o chr sinus problems, allergic rhinitis, off allergy shots due to "reactions"=> then resumed; her sinuses were clear x for some turbinate hypertrophy; symptoms felt to be allergy related!    She last saw allergist DrWhelan 12/18/16> sick visit, on immunotherapy, c/o HA/ sinus pressure/ sore throat/ earache/ dizzy;  Rec to continue her Advair, Proair, Zyrtek, Flonase, etc; given Depo & Doxy x10d...    She went to the ER 03/17/17> c/o cough, sneezing, dry scratchy throat, long-standing allergies;  Exam was neg & lungs were clear;  CXR 03/17/17 showed norm heart size, aortic atherosclerosis, clear lungs- NAD, min interstitial prominence, multilevel DDD in Tspine;  EKG 03/17/17 showed NSR, rate91, WNL, NAD;  LABS 03/17/17 showed Chems- ok w/ BS=113 & Cr=0.75, BCB- ok w/ Hg=15 & WBC=8.6, BNP=29, Trop=0;  Rx w/ Tessalon & MMW...     She is followed by PCP- [facility] 03/27/17>  She had MMW & Tessalon perles refilled but refused Levaquin due to potential side effects listed in the Pharm hand-out;  She was felt to have pansinusitis & asked to f/u w/ ENT; also referred to GI for f/u colonoscopy due...  We reviewed the following medical problems during today's office visit >>     AR, Asthma> on Zyrtek, Flonase, [id number] ProairHFA; she is c/o incr allergy symptoms since she [place] allergy shots [place] dry cough, no sput/ wheezing/ SOB/ etc...    HBP> on Amlod5, Hct12.5, K20;  BP= 130/84 & she denies CP, palpit, dizzy, SOB, edema; improving w/ diet, exercise, & wt loss...    Lipids>  on Fish Oil & diet, she is INTOL to all statins etc;  FLP 2/18 showed TChol 214, TG 155, HDL 47, LDL 136=> needs better diet & wt reduction...     GI- GERD, Divertics, IBS, Polyps> on Nexium40; she  denies abd pain, n/v, c/d, blood seen; last colon was 3/10 by DrKaplan w/ adenomatous polyp removed; she is overdue for f/u 4yr;  Her cough etc are at least partially GERD related & reminded to follow a vigorous antireflux regimen- Nexium40 30 min before dinner, NPO after dinner, Pepcid20 1hr before bedtime, elev HOB 6"...    Hx Kidney stones> she passed a left renal stone in 2010 & saw DrOttelin; no recurrence...    DJD, FM, VitD defic> on OTC meds, MVI & VitD1000; she has seen DrOlin & told her knee is bone-on-bone but she doesn't want surg; she got on diet 2014, lost weight, incr exercise & knees are much better!    Anxiety> not currently on anxiolytic rx, declines meds although in my opinion she needs anxiolytic therapy... EXAM shows Afeb, VSS, O2sat=95% on RA;  HEENT- neg, mallampati2;  Chest- clear w/o w/r/r;  Heart- RR gr1/6 SEM w/o r/g;  Abd- soft, nontender, neg;  Ext- ok w/o c/c/e;  Neuro- intact... IMP/PLAN>>  I offered SAubrynnsome additional Rx to help w/ her symptoms-- asked to continue current meds, given Depo80 + Pred taper, add Mucinex 12018mid w/ fluids, and establish the vigorous antireflux regimen- Nexium40 30 min before dinner, NPO after dinner, Pepcid20 1hr before bedtime, elev HOB 6"... We plan ROV recheck in 4wks to assess response.          Problem List:    ALLERGIC RHINITIS (ICD-477.9) - she uses OTC Claritin, Saline and FLONASE... she really likes the "netti pot"... ~  2012: Allergy eval by DrYoung w/ neg IgE & RAST and normal PFTs; skin testing was pos for trees, weeds, dust mites, molds; she was started on vaccine & states she's had an easy spring & feels the shots are helping. ~  8/12:  She was treated by TP for acute sinusitis & had Sinus XRays that were neg... ~  8/13:  Presents w/ URI, sinusitis> treated w/ Depo/ Pred/ ZPak etc... ~  8/14:  She reports MUCH IMPROVED on meds and allergy shots...  ASTHMA (ICD-493.90) - stable on ADVAIR 250Bid... Intol Dulera w/ sore  throat & insomnia. ~  CXR 3/11 showed hypoaeration, basilar atx, NAD. ~  CXR 10/11 showed clear, DJD spine, NAD. ~  PFTs 12/12 showed FVC=2.68 (96%), FEV1=2.18 (111%), mid-flows=108%pred; she also had Lung volumes= wnl and DLCO= wnl... ~  12/12: presents w/ AB exac & treated w/ Depo/ Dosepak, Mucinex, Fluids, Hycodan... ~  CXR 8/13 showed normal heart size, clear lungs, NAD... ~  2/14: on Zyrtek, Flonase, Advair250, ProairHFA; off prev Pred- breathing is at baseline w/o cough, sput, hemoptysis, SOB, edema, etc; she is working to avoid exac ~  8/14: she has lost substantial weight on diet & exercise, feeling better, breathing better, continues on Advair250Bid & prn Proair...  HYPERTENSION (ICD-401.9) - controlled on NORVASC 89m41m, HCTZ 12.89mg58m KCl 20mE289mily... ~  12/12:  BP= 132/780 & BP's even better at home; denies HA, visual changes, CP, palipit, syncope, dyspnea, edema, etc... ~  5/13:  BP= 160/72 off of the diuretic which she was taking primarily for dizziness (?Meniere's); I explained how this is mainly used as BP med and she will re-start Rx. ~  8/13:  BP= 130/76 & she denies CP,  palpit, SOB, edema... ~  2/14: on Amlod5, Hct12.5, K20;  BP= 140/70 & she denies CP, palpit, dizzy, SOB, edema; rec to incr exercise program.  ~  8/14: on Amlod5, Hct12.5, K20;  BP= 140/80 & she denies CP, palpit, dizzy, SOB, edema; improving w/ diet, exercise, & wt loss  HYPERCHOLESTEROLEMIA, BORDERLINE (ICD-272.4) - on diet + exercise, doesn't want meds. ~  [place] 1/08 showed TChol 220, TG 279, HDL 43, LDL 122 ~  FLP 6/10 showed TChol 204, TG 167, HDL 43, LDL 123... needs better diet, get wt down. ~  FLP 6/11 showed TChol 221, TG 385, HDL 40, LDL 116... must get wt down! discussed low chol, low fat. ~  FLP 6/12 showed TChol 231, TG 187, HDL 44, LDL 149... Looks worse despite diet efforts, refer to [facility] has been working w/ her on a combination of Diet, Exercise, Fish Oil... ~  [place] 11/12  on lifestyle mod showed TChol 206, TG 311, HDL 45, LDL 121... Needs better low fat diet. ~  FLP 2/13 on lifestyle mod showed TChol 242, TG 157, HDL 45, LDL 165... We decided to start [place]. ~  [place] 2/14 on diet alone showed TChol 191, TG 275, HDL 38, LDL 103; she is INTOL to all Chol meds... ~  FLP 8/14 on diet alone after 32# wt loss showed TChol 182, TG 120, HDL 49, LDL 110  GERD (ICD-530.81) - prev on Protonix but insurance won't pay, therefore switch to Isabella 53m/d... ?Omep caused diarrhea. ~  last EGD 9/06 showed esophagitis...  Hx Gallstones -  s/p ERCP w/ sphincterotomy 12/04... and subsequent cholecystectomy...  DIVERTICULOSIS OF COLON (ICD-562.10) IBS (ICD-564.1) COLONIC POLYPS (ICD-211.3) ~  colonoscopy 12/00 showed divertics, hems, colon polyp- adenomatous...  ~  f/u colonoscopy 3/10 by DrKaplan w/ polyp removed- adenomatous, f/u planned 5 yrs.  Hx of NEPHROLITHIASIS (ICD-592.0) - went to ER 4/10 w/ left flank pain & CT showed mild hydroneph & kidney stone... passed it on her own & followed by Urology- DrOttelin... seen 11/11 w/ right flank pain & neg eval (exam w/ tender ribs on palp),  OSTEOARTHRITIS (ICD-715.90) - she has some left hip & R>L knee discomfort...  uses Tylenol for pain... she notes prev right knee arthroscopy in 1999 by DrRendall... Rx w/ VICODIN Prn. ~  6/11:  we discussed ortho referral & will set up refer to DrOlin per request. ~  6/12:  She notes LBP w/ prev XRays showing advanced degen arthritis but she declines referral to Ortho. ~  2/14: on Tramadol50, OTC meds, MVI & VitD1000; she has seen DrOlin & told her knee is bone-on-bone but she doesn't want surg. ~  8/14: she reports that her arthritic complaints and FM are improved after her wt loss...  FIBROMYALGIA (ICD-729.1) - takes Calcium, MVI, Vit D, & now TYLENOL Prn... hx mult somatic complaints.  VITAMIN D DEFICIENCY (ICD-268.9) ~  labs 6/10 showed Vit D level= 21... rec> Vit D 1000 u daily. ~   labs 3/11 showed Vit D level= 35... continue same. ~  Labs 2/14 showed Vit D level = 49  POSTCONCUSSION SYNDROME (ICD-310.2) - [place], fully recovered & back to baseline. CERVICAL STRAIN, ACUTE (ICD-847.0) - "I do exercises and have a good pillow"... DIZZINESS (ICD-780.4) - uses MECLIZINE 229mPrn...  ANXIETY (ICD-300.00) - INTOL Ambien and Lorazepam w/ nightmares... ~  5/13:  She is under a lot of stress she says & has agreed to try medication to help;  rec> KLONOPIN 0.38m bid... ~  2/14: she does not list any anxiolytic med at this time...  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   Past Surgical History:  Procedure Laterality Date  . CHOLECYSTECTOMY, LAPAROSCOPIC  2004   for gallstone pancreatitis  . KNEE SURGERY Right   . KNEE SURGERY Left   . THYROID SURGERY      Outpatient Encounter Medications as of 04/07/2017  Medication Sig  . acetaminophen (TYLENOL) 500 MG tablet Take 500 mg by mouth every 6 (six) hours as needed (for headache as needed).   .Pamella PertHFA 1974-16MCG/ACT inhaler Inhale 2 puffs into the lungs 2 (two) times daily.   .Marland Kitchenalbuterol (PROVENTIL HFA;VENTOLIN HFA) 108 (90 Base) MCG/ACT inhaler Inhale 2 puffs every 4 (four) hours as needed into the lungs for wheezing or shortness of breath. RESCUE  . amLODipine (NORVASC) 5 MG tablet TAKE 1 TABLET BY MOUTH EVERY DAY FOR BLOOD PRESSURE  . aspirin 81 MG EC tablet Take 81 mg by mouth daily.    . benzonatate (TESSALON) 100 MG capsule Take 1 capsule (100 mg total) every 8 (eight) hours by mouth.  . benzonatate (TESSALON) 100 MG capsule Take 1 capsule (100 mg total) 3 (three) times daily as needed by mouth for cough.  . Cetirizine HCl (ZYRTEC ALLERGY) 10 MG CAPS Take 1 capsule by mouth daily.    . Cholecalciferol (VITAMIN D3) 1000 UNITS CAPS Take by mouth daily.    .Marland Kitchenesomeprazole (NEXIUM) 40 MG capsule TAKE 1 CAPSULE (40 MG TOTAL) BY  MOUTH DAILY.  . fluticasone (FLONASE) 50 MCG/ACT nasal spray Shake liquid and use one to two sprays in each nostril twice daily  . hydrochlorothiazide (MICROZIDE) 12.5 MG capsule TAKE 1 CAPSULE BY MOUTH EVERY DAY  . magic mouthwash SOLN Take 5 mLs 3 (three) times daily as needed by mouth for mouth pain.  . meclizine (ANTIVERT) 25 MG tablet TAKE 1 TABLET 3 TIMES A DAY AS NEEDED FOR DIZZINESS/NAUSEA  . Multiple Vitamins-Minerals (CENTRUM SILVER PO) Take by mouth daily.    . Omega-3 Fatty Acids (FISH OIL MAXIMUM STRENGTH) 1200 MG CAPS Take by mouth. 2 by mouth daily  . potassium chloride SA (KLOR-CON M20) 20 MEQ tablet Take 1 tablet (20 mEq total) by mouth daily.  .Marland KitchenPropylene Glycol (SYSTANE BALANCE) 0.6 % SOLN Apply 1 drop to eye 2 (two) times daily.  .Marland KitchenSALINE NASAL SPRAY NA Place 88 mcg into the nose 2 (two) times daily.  . predniSONE (DELTASONE) 10 MG tablet Take 1 tablet (10 mg total) by mouth 2 (two) times daily. Start in AM 11/27 w/one tab twice daily for 5 days, then decrease to one tab each AM for 5 days, then decrease to 1/2 tab each AM for 5 days, then decrease to 1/2 tab every other day until gone.  . [EXPIRED] methylPREDNISolone acetate (DEPO-MEDROL) injection 80 mg    No facility-administered encounter medications on file as of 04/07/2017.     Allergies  Allergen Reactions  . Ace Inhibitors   . Amoxicillin     REACTION: unknown? pain in right kidney  . Benicar Hct [Olmesartan Medoxomil-Hctz]     Extreme weakness  . Budesonide-Formoterol Fumarate     Causes tremors and numbness  . Chlorzoxazone [Chlorzoxazone]   . Citalopram Hydrobromide     REACTION: hives  . Citalopram Hydrobromide   . Clonidine Derivatives   . Droperidol     REACTION: hives  .  Flexeril [Cyclobenzaprine Hcl]   . Ketorolac Tromethamine     REACTION: hives  . Ketorolac Tromethamine   . Metoclopramide Hcl   . Mometasone Furo-Formoterol Fum     Causes sore throat, blurred vision and unable to  sleep--started on med 09-17-10  . Morphine     REACTION: hives and itching  . Olmesartan Medoxomil     REACTION: fatique  . Breo Ellipta [Fluticasone Furoate-Vilanterol] Itching    Pt reports itching with Memory Dance is related to being lactose intolerant.     Current Medications, Allergies, Past Medical History, Past Surgical History, Family History, and Social History were reviewed in [employer] record.   Review of Systems        See HPI - all other systems neg except as noted... The patient complains of decreased hearing, dyspnea on exertion, headaches, muscle weakness, and difficulty walking.  The patient denies anorexia, fever, weight loss, weight gain, vision loss, hoarseness, chest pain, syncope, peripheral edema, prolonged cough, hemoptysis, abdominal pain, melena, hematochezia, severe indigestion/heartburn, hematuria, incontinence, suspicious skin lesions, transient blindness, depression, unusual weight change, abnormal bleeding, enlarged lymph nodes, and angioedema.     Objective:   Physical Exam     WD, WN, 76 y/o WF in NAD... Great job w/ wt reduction. GENERAL:  Alert & oriented; pleasant & cooperative... HEENT:  [place]/AT, EOM-wnl, PERRLA, EACs-clear, TMs-wnl, NOSE: sl pale, no exud; THROAT-clear & wnl. NECK:  Supple w/ fair ROM; no JVD; normal carotid impulses w/o bruits; no thyromegaly or nodules palpated; no lymphadenopathy. CHEST:  Clear w/o wheezing, no rales or signs of consolidation... HEART:  Regular Rhythm; without murmurs/ rubs/ or gallops detected... ABDOMEN:  Soft & nontender; normal bowel sounds; no organomegaly or masses palpated... EXT: without deformities, mild arthritic changes; no varicose veins/ venous insuffic/ or edema. BACK:  she has numerous trigger point and tender spots thru the shoulders, back, chest etc... NEURO:  CN's intact; no focal neuro deficits... DERM:  No lesions noted; no rash etc...  RADIOLOGY DATA:  Reviewed in the EPIC  EMR & discussed w/ the patient...  LABORATORY DATA:  Reviewed in the EPIC EMR & discussed w/ the patient...   Assessment & Plan:    07/29/16>   We reviewed need for diet/ exercise/ wt reduction;  She will continue w/ Antihist Qam, Flonase- 2sp each nostril Bid, Saline nasal mist Q1h as needed, and the Advair250Bid;  Add Mucinex 1294m bid w/ fluids 04/07/17>   I offered SLillisome additional Rx to help w/ her symptoms-- asked to continue current meds, given Depo80 + Pred taper, add Mucinex 12037mid w/ fluids, and establish the vigorous antireflux regimen- Nexium40 30 min before dinner, NPO after dinner, Pepcid20 1hr before bedtime, elev HOB 6"... We plan ROV recheck in 4wks to assess response.   AR/AB>  Allergy eval by DrYoung w/ neg IgE & RAST and normal PFTs; skin testing was pos for trees, weeds, dust mites, molds; improved on vaccine & states she's had an easy yr so far & feels the shots are helping...  HBP>  BP is controlled on Norvasc & HCT; continue same + diet/ exercise/ wt reduction...  CHOL>  She is INTOL to all statins and on diet + FishOil alone; we reviewed low fat diet & exerc program=> great job!  GERD>  She notes that the Nexium really works for her...  DJD/ Back Pain/ FM>  On OTC meds as needed but she is much improved w/ her wt loss...  Anxiety>  She  was intol to Lorazepam in the past, tried Klonopin, but doesn't stick w/ any med...  Mult medication sensitivities>  She has an impressive list of med reactions> currently numbering 62...     Medication List        Accurate as of 04/07/17 11:59 PM. Always use your most recent med list.          acetaminophen 500 MG tablet Commonly known as:  TYLENOL   ADVAIR HFA 115-21 MCG/ACT inhaler Generic drug:  fluticasone-salmeterol   albuterol 108 (90 Base) MCG/ACT inhaler Commonly known as:  PROVENTIL HFA;VENTOLIN HFA Inhale 2 puffs every 4 (four) hours as needed into the lungs for wheezing or shortness of breath.  RESCUE   amLODipine 5 MG tablet Commonly known as:  NORVASC TAKE 1 TABLET BY MOUTH EVERY DAY FOR BLOOD PRESSURE   aspirin 81 MG EC tablet   * benzonatate 100 MG capsule Commonly known as:  TESSALON Take 1 capsule (100 mg total) every 8 (eight) hours by mouth.   * benzonatate 100 MG capsule Commonly known as:  TESSALON Take 1 capsule (100 mg total) 3 (three) times daily as needed by mouth for cough.   CENTRUM SILVER PO   esomeprazole 40 MG capsule Commonly known as:  NEXIUM TAKE 1 CAPSULE (40 MG TOTAL) BY MOUTH DAILY.   FISH OIL MAXIMUM STRENGTH 1200 MG Caps   fluticasone 50 MCG/ACT nasal spray Commonly known as:  FLONASE Shake liquid and use one to two sprays in each nostril twice daily   hydrochlorothiazide 12.5 MG capsule Commonly known as:  MICROZIDE TAKE 1 CAPSULE BY MOUTH EVERY DAY   magic mouthwash Soln Take 5 mLs 3 (three) times daily as needed by mouth for mouth pain.   meclizine 25 MG tablet Commonly known as:  ANTIVERT TAKE 1 TABLET 3 TIMES A DAY AS NEEDED FOR DIZZINESS/NAUSEA   potassium chloride SA 20 MEQ tablet Commonly known as:  KLOR-CON M20 Take 1 tablet (20 mEq total) by mouth daily.   predniSONE 10 MG tablet Commonly known as:  DELTASONE Take 1 tablet (10 mg total) by mouth 2 (two) times daily. Start in AM 11/27 w/one tab twice daily for 5 days, then decrease to one tab each AM for 5 days, then decrease to 1/2 tab each AM for 5 days, then decrease to 1/2 tab every other day until gone.   SALINE NASAL SPRAY NA   SYSTANE BALANCE 0.6 % Soln Generic drug:  Propylene Glycol   Vitamin D3 1000 units Caps   ZYRTEC ALLERGY 10 MG Caps Generic drug:  Cetirizine HCl      * This list has 2 medication(s) that are the same as other medications prescribed for you. Read the directions carefully, and ask your doctor or other care provider to review them with you.          Where to Get Your Medications    You can get these medications from any pharmacy    Bring a paper prescription for each of these medications  predniSONE 10 MG tablet

## 2017-04-08 NOTE — Telephone Encounter (Signed)
rx has been printed out and given to the pt.  Nothing further is needed.

## 2017-04-10 DIAGNOSIS — J309 Allergic rhinitis, unspecified: Secondary | ICD-10-CM | POA: Diagnosis not present

## 2017-04-21 ENCOUNTER — Other Ambulatory Visit: Payer: Self-pay | Admitting: *Deleted

## 2017-04-21 DIAGNOSIS — K219 Gastro-esophageal reflux disease without esophagitis: Secondary | ICD-10-CM

## 2017-04-21 MED ORDER — ESOMEPRAZOLE MAGNESIUM 40 MG PO CPDR: 40.0000 mg | DELAYED_RELEASE_CAPSULE | Freq: Every day | ORAL | 1 refills | Status: DC

## 2017-04-21 NOTE — Telephone Encounter (Signed)
[facility]

## 2017-04-22 ENCOUNTER — Other Ambulatory Visit: Payer: Self-pay | Admitting: *Deleted

## 2017-04-22 DIAGNOSIS — K219 Gastro-esophageal reflux disease without esophagitis: Secondary | ICD-10-CM

## 2017-04-22 MED ORDER — ESOMEPRAZOLE MAGNESIUM 40 MG PO CPDR: 40.0000 mg | DELAYED_RELEASE_CAPSULE | Freq: Every day | ORAL | 1 refills | Status: DC

## 2017-04-22 NOTE — Telephone Encounter (Signed)
[facility]

## 2017-04-24 ENCOUNTER — Telehealth: Payer: Self-pay | Admitting: Pulmonary Disease

## 2017-04-24 NOTE — Telephone Encounter (Signed)
Called and spoke with pt and she is aware that as long as she is not running a fever then she can get the flu vaccine.

## 2017-05-01 ENCOUNTER — Ambulatory Visit: Payer: PPO | Admitting: Pulmonary Disease

## 2017-05-03 ENCOUNTER — Other Ambulatory Visit: Payer: Self-pay | Admitting: Nurse Practitioner

## 2017-05-05 ENCOUNTER — Other Ambulatory Visit: Payer: Self-pay | Admitting: Pulmonary Disease

## 2017-05-26 ENCOUNTER — Other Ambulatory Visit: Payer: Self-pay | Admitting: Pulmonary Disease

## 2017-05-28 ENCOUNTER — Encounter (INDEPENDENT_AMBULATORY_CARE_PROVIDER_SITE_OTHER): Payer: Self-pay

## 2017-05-28 ENCOUNTER — Encounter: Payer: Self-pay | Admitting: Gastroenterology

## 2017-05-28 ENCOUNTER — Ambulatory Visit: Payer: PPO | Admitting: Gastroenterology

## 2017-05-28 VITALS — BP 138/86 | HR 80 | Ht 63.0 in | Wt 189.2 lb

## 2017-05-28 DIAGNOSIS — R198 Other specified symptoms and signs involving the digestive system and abdomen: Secondary | ICD-10-CM

## 2017-05-28 DIAGNOSIS — Z86010 Personal history of colonic polyps: Secondary | ICD-10-CM

## 2017-05-28 DIAGNOSIS — K219 Gastro-esophageal reflux disease without esophagitis: Secondary | ICD-10-CM | POA: Diagnosis not present

## 2017-05-28 MED ORDER — NA SULFATE-K SULFATE-MG SULF 17.5-3.13-1.6 GM/177ML PO SOLN: 1.0000 | Freq: Once | ORAL | 0 refills | Status: AC

## 2017-05-28 MED ORDER — METHYLCELLULOSE (LAXATIVE) PO POWD: ORAL | Status: DC

## 2017-05-28 NOTE — Patient Instructions (Addendum)
If you are age 79 or older, your body mass index should be between 23-30. Your Body mass index is 33.52 kg/m. If this is out of the aforementioned range listed, please consider follow up with your Primary Care Provider.  If you are age 36 or younger, your body mass index should be between 19-25. Your Body mass index is 33.52 kg/m. If this is out of the aformentioned range listed, please consider follow up with your Primary Care Provider.   You have been scheduled for a colonoscopy/endoscopy. Please follow written instructions given to you at your visit today.  Please pick up your prep supplies at the pharmacy within the next 1-3 days. If you use inhalers (even only as needed), please bring them with you on the day of your procedure. Your physician has requested that you go to www.startemmi.com and enter the access code given to you at your visit today. This web site gives a general overview about your procedure. However, you should still follow specific instructions given to you by our office regarding your preparation for the procedure.  Please purchase Citrucel, over the counter, and take once daily.  Thank you for entrusting me with your care and for [facility], Dr. [place] Cellar

## 2017-05-28 NOTE — Progress Notes (Signed)
HPI :  77 y/o female with a history of colon adenomas, GERD, reported IBS, diverticulosis, here for new patient visit to establish care with [place], referred by Sherrie Mustache.  She reports her bowels have been altered recently. She reports often having loose stools after she eats, and in rare circumstances had one episode of fecal incontinence. However she also endorses constipation at times as well and difficulty with completely evacuating herself. Overall she thinks looser stools are more predominant however her bowel habits seem to be going back and forth. She endorses increased gas which is bothering her and bloating as well. She denies much of any abdominal discomfort. She denies any weight loss, in fact she is gaining weight. She denies any routine blood in her stools. Her last colonoscopy was in 2010 she had one small adenoma removed. She also had diarrhea at that time and biopsies were taken which ruled out microscopic colitis. She denies any family history of colon cancer. She hasn't tried taking anything for her bowels at this point.  She has a long-standing history of acid reflux. Symptoms mostly of heartburn without dysphagia. She is taking Nexium every day for the symptoms and it controls her symptoms quite well. Of note she had an EGD in 2006, it was noted that she had a 2 cm tongue of suspected Barrett's esophagus, although biopsies of this lesion showed only changes of reflux. She has not had a follow-up EGD since that time.  Colonoscopy 07/28/2008 - diverticulosis, one small adenoma, biopsies taken to rule out microscopic colitis and negative EGD 9/209/2006 - 2cm segment of possible BE - bx show reflux changes only, no BE  Past Medical History:  Diagnosis Date  . ALLERGIC RHINITIS   . Anxiety   . Asthma   . Blood type O+   . Cervical strain, acute   . Colon polyp 2010   adenoma  . Diverticulosis of colon   . Dizziness   . Fibromyalgia   . GERD (gastroesophageal reflux disease)    . History of nephrolithiasis   . Hypercholesterolemia    borderline  . Hypertension   . IBS (irritable bowel syndrome)   . Osteoarthritis   . Personal history of allergy to unspecified medicinal agent   . Postconcussion syndrome   . Vitamin D deficiency      Past Surgical History:  Procedure Laterality Date  . CHOLECYSTECTOMY, LAPAROSCOPIC  2004   for gallstone pancreatitis  . KNEE SURGERY Right   . KNEE SURGERY Left   . THYROID SURGERY     Family History  Problem Relation Age of Onset  . Breast cancer Mother   . Alzheimer's disease Mother   . Heart disease Mother   . COPD Brother   . Heart disease Sister   . Breast cancer Sister   . Alcohol abuse Sister   . Ovarian cancer Paternal Grandmother   . Arthritis Other        Cousins   . COPD Cousin        Maternal side   . Heart attack Maternal Uncle    Social History   Tobacco Use  . Smoking status: Former Smoker    Packs/day: 2.00    Years: 8.00    Pack years: 16.00    Types: Cigarettes    Last attempt to quit: 05/13/1962    Years since quitting: 55.0  . Smokeless tobacco: Never Used  Substance Use Topics  . Alcohol use: No  . Drug use: No  Current Outpatient Medications  Medication Sig Dispense Refill  . acetaminophen (TYLENOL) 500 MG tablet Take 500 mg by mouth every 6 (six) hours as needed (for headache as needed).     Pamella Pert HFA 993-71 MCG/ACT inhaler Inhale 2 puffs into the lungs 2 (two) times daily.     Marland Kitchen albuterol (PROVENTIL HFA;VENTOLIN HFA) 108 (90 Base) MCG/ACT inhaler Inhale 2 puffs every 4 (four) hours as needed into the lungs for wheezing or shortness of breath. RESCUE 3 Inhaler 1  . amLODipine (NORVASC) 5 MG tablet TAKE 1 TABLET BY MOUTH EVERY DAY FOR BLOOD PRESSURE 90 tablet 1  . aspirin 81 MG EC tablet Take 81 mg by mouth daily.      . Cetirizine HCl (ZYRTEC ALLERGY) 10 MG CAPS Take 1 capsule by mouth daily.      . Cholecalciferol (VITAMIN D3) 1000 UNITS CAPS Take by mouth daily.      Marland Kitchen  esomeprazole (NEXIUM) 40 MG capsule Take 1 capsule (40 mg total) by mouth daily. 90 capsule 1  . fluticasone (FLONASE) 50 MCG/ACT nasal spray SHAKE LIQUID AND USE ONE TO TWO SPRAYS IN EACH NOSTRIL TWICE DAILY 16 g 3  . hydrochlorothiazide (MICROZIDE) 12.5 MG capsule TAKE 1 CAPSULE BY MOUTH EVERY DAY 90 capsule 1  . meclizine (ANTIVERT) 25 MG tablet TAKE 1 TABLET 3 TIMES A DAY AS NEEDED FOR DIZZINESS/NAUSEA 90 tablet 1  . Multiple Vitamins-Minerals (CENTRUM SILVER PO) Take by mouth daily.      . Omega-3 Fatty Acids (FISH OIL MAXIMUM STRENGTH) 1200 MG CAPS Take by mouth. 2 by mouth daily    . potassium chloride SA (KLOR-CON M20) 20 MEQ tablet Take 1 tablet (20 mEq total) by mouth daily. 90 tablet 1  . predniSONE (DELTASONE) 10 MG tablet Take 1 tablet (10 mg total) by mouth 2 (two) times daily. Start in AM 11/27 w/one tab twice daily for 5 days, then decrease to one tab each AM for 5 days, then decrease to 1/2 tab each AM for 5 days, then decrease to 1/2 tab every other day until gone. 25 tablet 0  . predniSONE (DELTASONE) 20 MG tablet TAKE AS DIRECTED (Patient taking differently: TAKE AS DIRECTED. taking .5 every day) 30 tablet 0  . Propylene Glycol (SYSTANE BALANCE) 0.6 % SOLN Apply 1 drop to eye 2 (two) times daily.    Marland Kitchen SALINE NASAL SPRAY NA Place 88 mcg into the nose 2 (two) times daily.     No current facility-administered medications for this visit.    Allergies  Allergen Reactions  . Ace Inhibitors   . Amoxicillin     REACTION: unknown? pain in right kidney  . Benicar Hct [Olmesartan Medoxomil-Hctz]     Extreme weakness  . Budesonide-Formoterol Fumarate     Causes tremors and numbness  . Chlorzoxazone [Chlorzoxazone]   . Citalopram Hydrobromide     REACTION: hives  . Citalopram Hydrobromide   . Clonidine Derivatives   . Droperidol     REACTION: hives  . Flexeril [Cyclobenzaprine Hcl]   . Ketorolac Tromethamine     REACTION: hives  . Ketorolac Tromethamine   . Metoclopramide Hcl    . Mometasone Furo-Formoterol Fum     Causes sore throat, blurred vision and unable to sleep--started on med 09-17-10  . Morphine     REACTION: hives and itching  . Olmesartan Medoxomil     REACTION: fatique  . Breo Ellipta [Fluticasone Furoate-Vilanterol] Itching    Pt reports itching with [place] is related  to being lactose intolerant.      Review of Systems: All systems reviewed and negative except where noted in HPI.   Lab Results  Component Value Date   WBC 8.6 03/17/2017   HGB 15.0 03/17/2017   HCT 43.9 03/17/2017   MCV 85.7 03/17/2017   PLT 287 03/17/2017    Lab Results  Component Value Date   CREATININE 0.75 03/17/2017   BUN 21 (H) 03/17/2017   NA 139 03/17/2017   K 4.4 03/17/2017   CL 104 03/17/2017   CO2 26 03/17/2017    Lab Results  Component Value Date   ALT 24 12/31/2016   AST 23 12/31/2016   ALKPHOS 98 12/31/2016   BILITOT 0.6 12/31/2016     Physical Exam: BP 138/86   Pulse 80   Ht 5' 3"  (1.6 m)   Wt 189 lb 4 oz (85.8 kg)   BMI 33.52 kg/m  Constitutional: Pleasant,well-developed, female in no acute distress. HEENT: Normocephalic and atraumatic. Conjunctivae are normal. No scleral icterus. Neck supple.  Cardiovascular: Normal rate, regular rhythm.  Pulmonary/chest: Effort normal and breath sounds normal. No wheezing, rales or rhonchi. Abdominal: Soft, nondistended, nontender. There are no masses palpable. No hepatomegaly. Extremities: no edema Lymphadenopathy: No cervical adenopathy noted. Neurological: Alert and oriented to person place and time. Skin: Skin is warm and dry. No rashes noted. Psychiatric: Normal mood and affect. Behavior is normal.   ASSESSMENT AND PLAN: 77 year old female with history as above here for assessment of the following issues:  Irregular bowel habits / history of colon adenoma - alternating loose stools and constipation, without alarm symptoms or anemia. She does have a history of adenomas in his overdue for  surveillance colonoscopy. I discussed whether or not she wanted surveillance colonoscopy, in light of her bowel symptoms. I discussed risk benefits of colonoscopy and anesthesia with her. She is otherwise in good health and wanted to proceed with colonoscopy. In the interim I discussed options for her bowel habits with her. She'll try daily fiber supplement such as Citrucel to use once daily and see if this can help regularize her bowels. Moving forward if she is more diarrhea predominant she can try taking Imodium, or can consider Colestid given her history of cholecystectomy.  GERD / suspected BE - I reviewed her prior upper endoscopy, there does appear to be a 2 cm segment of Barrett's on gross exam, prior biopsies were negative. Since she wanted a colonoscopy I offered her an endoscopy at the same time, to reassess this lesion and ensure no evidence of Barrett's or progression. Following discussion of this she wanted to proceed. She will continue Nexium in the interim.  [place] Cellar, MD [facility] Pager [phone]  CC: Lauree Chandler, NP

## 2017-06-12 ENCOUNTER — Encounter: Payer: Self-pay | Admitting: Gastroenterology

## 2017-06-13 ENCOUNTER — Telehealth: Payer: Self-pay

## 2017-06-13 NOTE — Telephone Encounter (Signed)
Yes would recommend her following gastroenterologist recommendations

## 2017-06-13 NOTE — Telephone Encounter (Signed)
Left detailed message on voicemail with [name]'s response. Patient instructed to call if questions or concerns

## 2017-06-13 NOTE — Telephone Encounter (Signed)
Patient called to get Carla Chandler, NP Opinion as to whether or not she should get colonoscopy as recommended by GI.  Patient has paperwork with several warnings, one in particular is diverticulitis, patient concerned about possible complications that she could run into yet has current issues that warrant a colonoscopy and does not know what to do.  Please advise

## 2017-06-25 ENCOUNTER — Ambulatory Visit (AMBULATORY_SURGERY_CENTER): Payer: PPO | Admitting: Gastroenterology

## 2017-06-25 ENCOUNTER — Other Ambulatory Visit (HOSPITAL_COMMUNITY)
Admission: RE | Admit: 2017-06-25 | Discharge: 2017-06-25 | Disposition: A | Discharge status: Home / Self Care | Payer: PPO | Source: Ambulatory Visit | Attending: Gastroenterology | Admitting: Gastroenterology

## 2017-06-25 ENCOUNTER — Other Ambulatory Visit: Payer: Self-pay

## 2017-06-25 ENCOUNTER — Encounter: Payer: Self-pay | Admitting: Gastroenterology

## 2017-06-25 VITALS — BP 150/53 | HR 72 | Temp 98.2°F | Resp 17 | Ht 63.0 in | Wt 189.0 lb

## 2017-06-25 DIAGNOSIS — D126 Benign neoplasm of colon, unspecified: Secondary | ICD-10-CM | POA: Diagnosis not present

## 2017-06-25 DIAGNOSIS — D120 Benign neoplasm of cecum: Secondary | ICD-10-CM | POA: Insufficient documentation

## 2017-06-25 DIAGNOSIS — K317 Polyp of stomach and duodenum: Secondary | ICD-10-CM | POA: Diagnosis not present

## 2017-06-25 DIAGNOSIS — B3781 Candidal esophagitis: Secondary | ICD-10-CM

## 2017-06-25 DIAGNOSIS — D122 Benign neoplasm of ascending colon: Secondary | ICD-10-CM

## 2017-06-25 DIAGNOSIS — Z86010 Personal history of colonic polyps: Secondary | ICD-10-CM | POA: Diagnosis not present

## 2017-06-25 DIAGNOSIS — B379 Candidiasis, unspecified: Secondary | ICD-10-CM | POA: Diagnosis not present

## 2017-06-25 DIAGNOSIS — D123 Benign neoplasm of transverse colon: Secondary | ICD-10-CM

## 2017-06-25 DIAGNOSIS — K219 Gastro-esophageal reflux disease without esophagitis: Secondary | ICD-10-CM | POA: Diagnosis not present

## 2017-06-25 DIAGNOSIS — Z1211 Encounter for screening for malignant neoplasm of colon: Secondary | ICD-10-CM | POA: Diagnosis not present

## 2017-06-25 MED ORDER — SODIUM CHLORIDE 0.9 % IV SOLN: 500.0000 mL | Freq: Once | INTRAVENOUS | Status: DC

## 2017-06-25 NOTE — Op Note (Signed)
[facility] Patient Name: [name] Procedure Date: 06/25/2017 3:46 PM MRN: [id number] Endoscopist: Remo Lipps P. Armbruster MD, MD Age: 77 Referring MD:  Date of Birth: Oct 07, 1940 Gender: Female Account #: [id number] Procedure:                Upper GI endoscopy Indications:              Heartburn, history of possible Barrett's esophagus                            endoscopically but biopsies negative Medicines:                Monitored Anesthesia Care Procedure:                Pre-Anesthesia Assessment:                           - Prior to the procedure, a History and Physical                            was performed, and patient medications and                            allergies were reviewed. The patient's tolerance of                            previous anesthesia was also reviewed. The risks                            and benefits of the procedure and the sedation                            options and risks were discussed with the patient.                            All questions were answered, and informed consent                            was obtained. Prior Anticoagulants: The patient has                            taken no previous anticoagulant or antiplatelet                            agents. ASA Grade Assessment: III - A patient with                            severe systemic disease. After reviewing the risks                            and benefits, the patient was deemed in                            satisfactory condition to undergo the procedure.  After obtaining informed consent, the endoscope was                            passed under direct vision. Throughout the                            procedure, the patient's blood pressure, pulse, and                            oxygen saturations were monitored continuously. The                            Endoscope was introduced through the mouth, and                            advanced  to the second part of duodenum. The upper                            GI endoscopy was accomplished without difficulty.                            The patient tolerated the procedure well. Scope In: Scope Out: Findings:                 Esophagogastric landmarks were identified: the                            Z-line was found at 37 cm, the gastroesophageal                            junction was found at 37 cm and the upper extent of                            the gastric folds was found at 37 cm from the                            incisors. The z-line was slightly irregular but                            without any obvious mucosal abnormalities. No                            obvious evidence of Barrett's esophagus.                           Multiple diminutive white plaques were found in the                            entire esophagus suggestive for esophageal                            candidiasis. Brushings for KOH prep were obtained  in the entire esophagus.                           Suspected esophageal varices were found in the                            middle third of the esophagus. They were medium in                            size without any stigmata for bleeding.                           The exam of the esophagus was otherwise normal.                           Multiple 3 to 8 mm sessile polyps were found in the                            gastric fundus and in the gastric body. The largest                            polyp was removed with a hot snare. Resection and                            retrieval were complete. Biopsies were taken with a                            cold forceps for histology of some smaller polyps.                           The exam of the stomach was otherwise normal.                           The duodenal bulb and second portion of the                            duodenum were normal. Complications:            No immediate  complications. Estimated blood loss:                            Minimal. Estimated Blood Loss:     Estimated blood loss was minimal. Impression:               - Esophagogastric landmarks identified. No                            Barrett's esophagus                           - Multiple white plaques in the esophagus                            suggestive of esophageal candidiasis. Brushings  performed.                           - Suspected esophageal varices.                           - Multiple benign appearing gastric polyps. Largest                            was resected and retrieved / biopsied.                           - Normal duodenal bulb and second portion of the                            duodenum. Recommendation:           - Patient has a contact number available for                            emergencies. The signs and symptoms of potential                            delayed complications were discussed with the                            patient. Return to normal activities tomorrow.                            Written discharge instructions were provided to the                            patient.                           - Resume previous diet.                           - Continue present medications.                           - Await pathology results.                           - No ibuprofen, naproxen, or other non-steroidal                            anti-inflammatory drugs for 2 weeks after polyp                            removal.                           - Ultrasound of the liver to screen for possible                            cirrhosis given presence of suspected varices.  Consideration for nonselective beta blocker for                            varices pending workup. Remo Lipps P. Armbruster MD, MD 06/25/2017 5:02:29 PM This report has been signed electronically.

## 2017-06-25 NOTE — Patient Instructions (Signed)
YOU HAD AN ENDOSCOPIC PROCEDURE TODAY AT [facility]:   Refer to the procedure report that was given to you for any specific questions about what was found during the examination.  If the procedure report does not answer your questions, please call your gastroenterologist to clarify.  If you requested that your care partner not be given the details of your procedure findings, then the procedure report has been included in a sealed envelope for you to review at your convenience later.  YOU SHOULD EXPECT: Some feelings of bloating in the abdomen. Passage of more gas than usual.  Walking can help get rid of the air that was put into your GI tract during the procedure and reduce the bloating. If you had a lower endoscopy (such as a colonoscopy or flexible sigmoidoscopy) you may notice spotting of blood in your stool or on the toilet paper. If you underwent a bowel prep for your procedure, you may not have a normal bowel movement for a few days.  Please Note:  You might notice some irritation and congestion in your nose or some drainage.  This is from the oxygen used during your procedure.  There is no need for concern and it should clear up in a day or so.  SYMPTOMS TO REPORT IMMEDIATELY:   Following lower endoscopy (colonoscopy or flexible sigmoidoscopy):  Excessive amounts of blood in the stool  Significant tenderness or worsening of abdominal pains  Swelling of the abdomen that is new, acute  Fever of 100F or higher   Following upper endoscopy (EGD)  Vomiting of blood or coffee ground material  New chest pain or pain under the shoulder blades  Painful or persistently difficult swallowing  New shortness of breath  Fever of 100F or higher  Black, tarry-looking stools  For urgent or emergent issues, a gastroenterologist can be reached at any hour by calling [phone].  Please see handouts on polyps, diverticulosis, and hemorrhoids.   DIET:  We do recommend a small  meal at first, but then you may proceed to your regular diet.  Drink plenty of fluids but you should avoid alcoholic beverages for 24 hours.  ACTIVITY:  You should plan to take it easy for the rest of today and you should NOT DRIVE or use heavy machinery until tomorrow (because of the sedation medicines used during the test).    FOLLOW UP: Our staff will call the number listed on your records the next business day following your procedure to check on you and address any questions or concerns that you may have regarding the information given to you following your procedure. If we do not reach you, we will leave a message.  However, if you are feeling well and you are not experiencing any problems, there is no need to return our call.  We will assume that you have returned to your regular daily activities without incident.  If any biopsies were taken you will be contacted by phone or by letter within the next 1-3 weeks.  Please call us at [phone] if you have not heard about the biopsies in 3 weeks.    SIGNATURES/CONFIDENTIALITY: You and/or your care partner have signed paperwork which will be entered into your electronic medical record.  These signatures attest to the fact that that the information above on your After Visit Summary has been reviewed and is understood.  Full responsibility of the confidentiality of this discharge information lies with you and/or your care-partner.   Thank  you for allowing [place] to provide your healthcare today.

## 2017-06-25 NOTE — Op Note (Signed)
[facility] Patient Name: [name] Procedure Date: 06/25/2017 3:46 PM MRN: [id number] Endoscopist: Remo Lipps P. Armbruster MD, MD Age: 77 Referring MD:  Date of Birth: Sep 20, 1940 Gender: Female Account #: [id number] Procedure:                Colonoscopy Indications:              High risk colon cancer surveillance: Personal                            history of colonic polyps Medicines:                Monitored Anesthesia Care Procedure:                Pre-Anesthesia Assessment:                           - Prior to the procedure, a History and Physical                            was performed, and patient medications and                            allergies were reviewed. The patient's tolerance of                            previous anesthesia was also reviewed. The risks                            and benefits of the procedure and the sedation                            options and risks were discussed with the patient.                            All questions were answered, and informed consent                            was obtained. Prior Anticoagulants: The patient has                            taken no previous anticoagulant or antiplatelet                            agents. ASA Grade Assessment: III - A patient with                            severe systemic disease. After reviewing the risks                            and benefits, the patient was deemed in                            satisfactory condition to undergo the procedure.  After obtaining informed consent, the colonoscope                            was passed under direct vision. Throughout the                            procedure, the patient's blood pressure, pulse, and                            oxygen saturations were monitored continuously. The                            Colonoscope was introduced through the anus and                            advanced to the the cecum,  identified by                            appendiceal orifice and ileocecal valve. The                            colonoscopy was performed without difficulty. The                            patient tolerated the procedure well. The quality                            of the bowel preparation was adequate. The                            ileocecal valve, appendiceal orifice, and rectum                            were photographed. Scope In: 4:13:25 PM Scope Out: 4:44:15 PM Scope Withdrawal Time: 0 hours 23 minutes 11 seconds  Total Procedure Duration: 0 hours 30 minutes 50 seconds  Findings:                 The perianal exam findings include non-thrombosed                            external hemorrhoids.                           Three sessile polyps were found in the cecum. The                            polyps were 3 to 6 mm in size. These polyps were                            removed with a cold snare. Resection and retrieval                            were complete.  Four sessile polyps were found in the ascending                            colon. The polyps were 3 to 5 mm in size. These                            polyps were removed with a cold snare. Resection                            and retrieval were complete.                           A diminutive polyp was found in the transverse                            colon. The polyp was sessile. The polyp was removed                            with a cold biopsy forceps. Resection and retrieval                            were complete.                           A 4 mm polyp was found in the transverse colon. The                            polyp was sessile. The polyp was removed with a                            cold snare. Resection was complete, but the polyp                            tissue was not retrieved.                           A few small-mouthed diverticula were found in the                             sigmoid colon and ascending colon.                           Internal hemorrhoids were found during                            retroflexion. The hemorrhoids were moderate.                           The colon revealed excessive looping.                           The exam was otherwise without abnormality. Complications:            No immediate complications. Estimated blood loss:  Minimal. Estimated Blood Loss:     Estimated blood loss was minimal. Impression:               - Non-thrombosed external hemorrhoids found on                            perianal exam.                           - Three 3 to 6 mm polyps in the cecum, removed with                            a cold snare. Resected and retrieved.                           - Four 3 to 5 mm polyps in the ascending colon,                            removed with a cold snare. Resected and retrieved.                           - One diminutive polyp in the transverse colon,                            removed with a cold biopsy forceps. Resected and                            retrieved.                           - One 4 mm polyp in the transverse colon, removed                            with a cold snare. Complete resection. Polyp tissue                            not retrieved.                           - Diverticulosis in the sigmoid colon and in the                            ascending colon.                           - Internal hemorrhoids.                           - There was significant looping of the colon.                           - The examination was otherwise normal. Recommendation:           - Patient has a contact number available for  emergencies. The signs and symptoms of potential                            delayed complications were discussed with the                            patient. Return to normal activities tomorrow.                            Written  discharge instructions were provided to the                            patient.                           - Resume previous diet.                           - Continue present medications.                           - Await pathology results.                           - Repeat colonoscopy is recommended for                            surveillance. The colonoscopy date will be                            determined after pathology results from today's                            exam become available for review.                           - No ibuprofen, naproxen, or other non-steroidal                            anti-inflammatory drugs for 2 weeks after polyp                            removal. Remo Lipps P. Armbruster MD, MD 06/25/2017 4:52:05 PM This report has been signed electronically.

## 2017-06-25 NOTE — Progress Notes (Signed)
Called to room to assist during endoscopic procedure.  Patient ID and intended procedure confirmed with present staff. Received instructions for my participation in the procedure from the performing physician.  

## 2017-06-25 NOTE — Progress Notes (Signed)
A/ox3, pleased with MAC, report to  [employer]

## 2017-06-25 NOTE — Progress Notes (Signed)
Randall Hiss, RN took Esophageal brushing to [employer] - Brushing given to [place].  maw

## 2017-06-25 NOTE — Progress Notes (Signed)
Pt's states no medical or surgical changes since previsit or office visit. 

## 2017-06-26 ENCOUNTER — Telehealth: Payer: Self-pay | Admitting: *Deleted

## 2017-06-26 ENCOUNTER — Telehealth: Payer: Self-pay

## 2017-06-26 ENCOUNTER — Other Ambulatory Visit: Payer: Self-pay

## 2017-06-26 DIAGNOSIS — I8500 Esophageal varices without bleeding: Secondary | ICD-10-CM

## 2017-06-26 NOTE — Telephone Encounter (Signed)
  Follow up Call-  Call back number 06/25/2017  Post procedure Call Back phone  # [phone]  Permission to leave phone message Yes  Some recent data might be hidden     Patient questions:  Do you have a fever, pain , or abdominal swelling? No. Pain Score  0 *  Have you tolerated food without any problems? Yes.    Have you been able to return to your normal activities? Yes.    Do you have any questions about your discharge instructions: Diet   No. Medications  No. Follow up visit  No.  Do you have questions or concerns about your Care? No.  Actions: * If pain score is 4 or above: No action needed, pain <4.

## 2017-06-26 NOTE — Telephone Encounter (Signed)
-----   Message from Yetta Flock, MD sent at 06/25/2017  5:11 PM EST ----- Regarding: [place] order Almyra Free can you call this patient and help set up an US of the liver, rule out cirrhosis? Thanks

## 2017-06-26 NOTE — Telephone Encounter (Signed)
Spoke to patient and she is scheduled for [place] of liver to evaluate for possible cirrhosis. Scheduled at [facility] on 2/20 at 9:30. Patient advised to be NPO 6 hours prior, not even water.

## 2017-06-30 ENCOUNTER — Other Ambulatory Visit: Payer: Self-pay

## 2017-06-30 MED ORDER — FLUCONAZOLE 100 MG PO TABS: ORAL_TABLET | ORAL | 0 refills | Status: DC

## 2017-07-02 ENCOUNTER — Ambulatory Visit (HOSPITAL_COMMUNITY): Payer: PPO

## 2017-07-04 ENCOUNTER — Ambulatory Visit (HOSPITAL_COMMUNITY)
Admission: RE | Admit: 2017-07-04 | Discharge: 2017-07-04 | Disposition: A | Discharge status: Home / Self Care | Payer: PPO | Source: Ambulatory Visit | Attending: Gastroenterology | Admitting: Gastroenterology

## 2017-07-04 ENCOUNTER — Encounter: Payer: PPO | Admitting: Nurse Practitioner

## 2017-07-04 DIAGNOSIS — R933 Abnormal findings on diagnostic imaging of other parts of digestive tract: Secondary | ICD-10-CM | POA: Insufficient documentation

## 2017-07-04 DIAGNOSIS — I8500 Esophageal varices without bleeding: Secondary | ICD-10-CM | POA: Diagnosis not present

## 2017-07-04 DIAGNOSIS — K7460 Unspecified cirrhosis of liver: Secondary | ICD-10-CM | POA: Diagnosis not present

## 2017-07-05 ENCOUNTER — Observation Stay (HOSPITAL_COMMUNITY)
Admission: EM | Admit: 2017-07-05 | Discharge: 2017-07-06 | Disposition: A | Discharge status: Home / Self Care | Payer: PPO | Attending: Internal Medicine | Admitting: Internal Medicine

## 2017-07-05 ENCOUNTER — Encounter (HOSPITAL_COMMUNITY): Payer: Self-pay | Admitting: Emergency Medicine

## 2017-07-05 ENCOUNTER — Emergency Department (HOSPITAL_COMMUNITY): Payer: PPO

## 2017-07-05 DIAGNOSIS — J45909 Unspecified asthma, uncomplicated: Secondary | ICD-10-CM | POA: Insufficient documentation

## 2017-07-05 DIAGNOSIS — K760 Fatty (change of) liver, not elsewhere classified: Secondary | ICD-10-CM | POA: Diagnosis not present

## 2017-07-05 DIAGNOSIS — Z7982 Long term (current) use of aspirin: Secondary | ICD-10-CM | POA: Insufficient documentation

## 2017-07-05 DIAGNOSIS — M797 Fibromyalgia: Secondary | ICD-10-CM | POA: Insufficient documentation

## 2017-07-05 DIAGNOSIS — E785 Hyperlipidemia, unspecified: Secondary | ICD-10-CM | POA: Diagnosis not present

## 2017-07-05 DIAGNOSIS — E559 Vitamin D deficiency, unspecified: Secondary | ICD-10-CM | POA: Insufficient documentation

## 2017-07-05 DIAGNOSIS — Z86010 Personal history of colonic polyps: Secondary | ICD-10-CM | POA: Diagnosis not present

## 2017-07-05 DIAGNOSIS — Z880 Allergy status to penicillin: Secondary | ICD-10-CM | POA: Insufficient documentation

## 2017-07-05 DIAGNOSIS — I700 Atherosclerosis of aorta: Secondary | ICD-10-CM | POA: Insufficient documentation

## 2017-07-05 DIAGNOSIS — R61 Generalized hyperhidrosis: Secondary | ICD-10-CM | POA: Diagnosis not present

## 2017-07-05 DIAGNOSIS — K8500 Idiopathic acute pancreatitis without necrosis or infection: Secondary | ICD-10-CM

## 2017-07-05 DIAGNOSIS — K859 Acute pancreatitis without necrosis or infection, unspecified: Secondary | ICD-10-CM | POA: Diagnosis not present

## 2017-07-05 DIAGNOSIS — N281 Cyst of kidney, acquired: Secondary | ICD-10-CM | POA: Diagnosis not present

## 2017-07-05 DIAGNOSIS — F419 Anxiety disorder, unspecified: Secondary | ICD-10-CM | POA: Diagnosis not present

## 2017-07-05 DIAGNOSIS — I10 Essential (primary) hypertension: Secondary | ICD-10-CM | POA: Insufficient documentation

## 2017-07-05 DIAGNOSIS — E739 Lactose intolerance, unspecified: Secondary | ICD-10-CM | POA: Diagnosis not present

## 2017-07-05 DIAGNOSIS — K7460 Unspecified cirrhosis of liver: Secondary | ICD-10-CM | POA: Diagnosis not present

## 2017-07-05 DIAGNOSIS — Z8249 Family history of ischemic heart disease and other diseases of the circulatory system: Secondary | ICD-10-CM | POA: Diagnosis not present

## 2017-07-05 DIAGNOSIS — M1990 Unspecified osteoarthritis, unspecified site: Secondary | ICD-10-CM | POA: Diagnosis not present

## 2017-07-05 DIAGNOSIS — R112 Nausea with vomiting, unspecified: Secondary | ICD-10-CM | POA: Diagnosis not present

## 2017-07-05 DIAGNOSIS — R109 Unspecified abdominal pain: Secondary | ICD-10-CM | POA: Diagnosis not present

## 2017-07-05 DIAGNOSIS — K589 Irritable bowel syndrome without diarrhea: Secondary | ICD-10-CM | POA: Insufficient documentation

## 2017-07-05 DIAGNOSIS — R1110 Vomiting, unspecified: Secondary | ICD-10-CM | POA: Diagnosis not present

## 2017-07-05 DIAGNOSIS — K219 Gastro-esophageal reflux disease without esophagitis: Secondary | ICD-10-CM | POA: Diagnosis present

## 2017-07-05 DIAGNOSIS — Z79899 Other long term (current) drug therapy: Secondary | ICD-10-CM | POA: Diagnosis not present

## 2017-07-05 DIAGNOSIS — R197 Diarrhea, unspecified: Secondary | ICD-10-CM | POA: Diagnosis not present

## 2017-07-05 DIAGNOSIS — Z888 Allergy status to other drugs, medicaments and biological substances status: Secondary | ICD-10-CM | POA: Insufficient documentation

## 2017-07-05 DIAGNOSIS — R000 Tachycardia, unspecified: Secondary | ICD-10-CM | POA: Diagnosis not present

## 2017-07-05 DIAGNOSIS — Z885 Allergy status to narcotic agent status: Secondary | ICD-10-CM | POA: Insufficient documentation

## 2017-07-05 DIAGNOSIS — Z8719 Personal history of other diseases of the digestive system: Secondary | ICD-10-CM | POA: Insufficient documentation

## 2017-07-05 DIAGNOSIS — B3781 Candidal esophagitis: Secondary | ICD-10-CM | POA: Insufficient documentation

## 2017-07-05 DIAGNOSIS — Z87891 Personal history of nicotine dependence: Secondary | ICD-10-CM | POA: Diagnosis not present

## 2017-07-05 DIAGNOSIS — Z7952 Long term (current) use of systemic steroids: Secondary | ICD-10-CM | POA: Insufficient documentation

## 2017-07-05 LAB — CBC
HEMATOCRIT: 46.6 % — AB (ref 36.0–46.0)
HEMOGLOBIN: 15.7 g/dL — AB (ref 12.0–15.0)
MCH: 29.9 pg (ref 26.0–34.0)
MCHC: 33.7 g/dL (ref 30.0–36.0)
MCV: 88.8 fL (ref 78.0–100.0)
Platelets: 257 10*3/uL (ref 150–400)
RBC: 5.25 MIL/uL — AB (ref 3.87–5.11)
RDW: 13.5 % (ref 11.5–15.5)
WBC: 20.2 10*3/uL — ABNORMAL HIGH (ref 4.0–10.5)

## 2017-07-05 LAB — COMPREHENSIVE METABOLIC PANEL
ALBUMIN: 3.8 g/dL (ref 3.5–5.0)
ALK PHOS: 71 U/L (ref 38–126)
ALT: 42 U/L (ref 14–54)
ANION GAP: 13 (ref 5–15)
AST: 53 U/L — ABNORMAL HIGH (ref 15–41)
BUN: 26 mg/dL — ABNORMAL HIGH (ref 6–20)
CALCIUM: 8.9 mg/dL (ref 8.9–10.3)
CHLORIDE: 100 mmol/L — AB (ref 101–111)
CO2: 23 mmol/L (ref 22–32)
Creatinine, Ser: 0.85 mg/dL (ref 0.44–1.00)
GFR calc Af Amer: >60 mL/min (ref >60)
GFR calc non Af Amer: >60 mL/min (ref >60)
Glucose, Bld: 179 mg/dL — ABNORMAL HIGH (ref 65–99)
POTASSIUM: 3.6 mmol/L (ref 3.5–5.1)
SODIUM: 136 mmol/L (ref 135–145)
Total Bilirubin: 0.9 mg/dL (ref 0.3–1.2)
Total Protein: 6.4 g/dL — ABNORMAL LOW (ref 6.5–8.1)

## 2017-07-05 LAB — DIFFERENTIAL
BASOS PCT: 0 %
Basophils Absolute: 0 10*3/uL (ref 0.0–0.1)
EOS PCT: 0 %
Eosinophils Absolute: 0 10*3/uL (ref 0.0–0.7)
Lymphocytes Relative: 7 %
Lymphs Abs: 1.4 10*3/uL (ref 0.7–4.0)
Monocytes Absolute: 1.1 10*3/uL — ABNORMAL HIGH (ref 0.1–1.0)
Monocytes Relative: 5 %
NEUTROS ABS: 17.7 10*3/uL — AB (ref 1.7–7.7)
NEUTROS PCT: 88 %

## 2017-07-05 LAB — URINALYSIS, ROUTINE W REFLEX MICROSCOPIC
Bilirubin Urine: NEGATIVE
GLUCOSE, UA: NEGATIVE mg/dL
HGB URINE DIPSTICK: NEGATIVE
KETONES UR: NEGATIVE mg/dL
Nitrite: NEGATIVE
PROTEIN: NEGATIVE mg/dL
Specific Gravity, Urine: 1.043 — ABNORMAL HIGH (ref 1.005–1.030)
pH: 5 (ref 5.0–8.0)

## 2017-07-05 LAB — LIPASE, BLOOD: LIPASE: 274 U/L — AB (ref 11–51)

## 2017-07-05 MED ORDER — KETOROLAC TROMETHAMINE 15 MG/ML IJ SOLN: 15.0000 mg | Freq: Four times a day (QID) | INTRAMUSCULAR | Status: DC | PRN

## 2017-07-05 MED ORDER — FLUCONAZOLE 100 MG PO TABS
100.0000 mg | ORAL_TABLET | Freq: Every day | ORAL | Status: DC
  Administered 2017-07-06: 100 mg via ORAL
  Filled 2017-07-05: qty 1

## 2017-07-05 MED ORDER — SODIUM CHLORIDE 0.9 % IV BOLUS (SEPSIS)
500.0000 mL | Freq: Once | INTRAVENOUS | Status: AC
  Administered 2017-07-05: 500 mL via INTRAVENOUS

## 2017-07-05 MED ORDER — OXYCODONE HCL 5 MG PO TABS: 5.0000 mg | ORAL_TABLET | ORAL | Status: DC | PRN

## 2017-07-05 MED ORDER — IPRATROPIUM-ALBUTEROL 0.5-2.5 (3) MG/3ML IN SOLN
3.0000 mL | Freq: Two times a day (BID) | RESPIRATORY_TRACT | Status: DC
  Administered 2017-07-06: 3 mL via RESPIRATORY_TRACT

## 2017-07-05 MED ORDER — PANTOPRAZOLE SODIUM 40 MG IV SOLR
40.0000 mg | Freq: Once | INTRAVENOUS | Status: AC
  Administered 2017-07-05: 40 mg via INTRAVENOUS
  Filled 2017-07-05: qty 40

## 2017-07-05 MED ORDER — POTASSIUM CHLORIDE IN NACL 20-0.9 MEQ/L-% IV SOLN
INTRAVENOUS | Status: DC
  Administered 2017-07-05 – 2017-07-06 (×3): via INTRAVENOUS
  Filled 2017-07-05 (×3): qty 1000

## 2017-07-05 MED ORDER — POTASSIUM CHLORIDE IN NACL 20-0.9 MEQ/L-% IV SOLN: INTRAVENOUS | Status: DC

## 2017-07-05 MED ORDER — FLUCONAZOLE 100 MG PO TABS
200.0000 mg | ORAL_TABLET | Freq: Every day | ORAL | Status: AC
  Administered 2017-07-05: 100 mg via ORAL
  Filled 2017-07-05: qty 2

## 2017-07-05 MED ORDER — SALINE SPRAY 0.65 % NA SOLN
1.0000 | Freq: Two times a day (BID) | NASAL | Status: DC
  Administered 2017-07-05: 1 via NASAL
  Filled 2017-07-05: qty 44

## 2017-07-05 MED ORDER — ALBUTEROL SULFATE (2.5 MG/3ML) 0.083% IN NEBU: 2.5000 mg | INHALATION_SOLUTION | RESPIRATORY_TRACT | Status: DC | PRN

## 2017-07-05 MED ORDER — FLUTICASONE PROPIONATE 50 MCG/ACT NA SUSP
2.0000 | Freq: Every day | NASAL | Status: DC
  Filled 2017-07-05: qty 16

## 2017-07-05 MED ORDER — FLUCONAZOLE 100 MG PO TABS: 100.0000 mg | ORAL_TABLET | Freq: Every day | ORAL | Status: DC

## 2017-07-05 MED ORDER — IOPAMIDOL (ISOVUE-300) INJECTION 61%
INTRAVENOUS | Status: AC
  Filled 2017-07-05: qty 100

## 2017-07-05 MED ORDER — ACETAMINOPHEN 325 MG PO TABS
650.0000 mg | ORAL_TABLET | Freq: Four times a day (QID) | ORAL | Status: DC | PRN
Start: 2017-07-05 — End: 2017-07-06
  Administered 2017-07-05: 650 mg via ORAL
  Filled 2017-07-05: qty 2

## 2017-07-05 MED ORDER — POLYVINYL ALCOHOL 1.4 % OP SOLN
1.0000 [drp] | Freq: Two times a day (BID) | OPHTHALMIC | Status: DC
  Administered 2017-07-05: 1 [drp] via OPHTHALMIC
  Filled 2017-07-05: qty 15

## 2017-07-05 MED ORDER — IPRATROPIUM-ALBUTEROL 0.5-2.5 (3) MG/3ML IN SOLN
3.0000 mL | Freq: Four times a day (QID) | RESPIRATORY_TRACT | Status: DC
  Administered 2017-07-05: 3 mL via RESPIRATORY_TRACT
  Filled 2017-07-05: qty 3

## 2017-07-05 MED ORDER — IOPAMIDOL (ISOVUE-300) INJECTION 61%
INTRAVENOUS | Status: AC
  Administered 2017-07-05: 100 mL
  Filled 2017-07-05: qty 100

## 2017-07-05 MED ORDER — ACETAMINOPHEN 650 MG RE SUPP: 650.0000 mg | Freq: Four times a day (QID) | RECTAL | Status: DC | PRN

## 2017-07-05 NOTE — Progress Notes (Signed)
Patient arrived to 6n25 alert and oriented, mild pain to abdomen will medicate per orders. VSS, IV fluids infusing, placed on SCD's. Patient oriented to room and staff, no visitors/family present at the moment. Will continue to monitor.

## 2017-07-05 NOTE — H&P (Signed)
History and Physical    [name] DZH:[id number] DOB: January 08, 1941 DOA: 07/05/2017  PCP: Lauree Chandler, NP  Patient coming from: home  I have personally briefly reviewed patient's old medical records in [place]  Chief Complaint:   HPI: [name] is a 77 y.o. female with medical history significant of newly diagnosed mild cirrhosis, fatty liver, vitamin D deficiency, essential hypertension, gastroesophageal reflux disease, irritable bowel syndrome, osteoarthritis, and recent endoscopy as well as colonoscopy which showed benign colon polyps and endoscopy which showed esophageal candidiasis.  Testing was done on February 13.  She was prescribed a 14-day treatment of fluconazole for her esophageal candidiasis.  The patient lives alone and she woke up today at 3 AM with symptoms of reflux.  She took a Tums but did not get any better.  She had 2 episodes of vomiting at home and more here in the emergency department.  It was yellow-green but she has had persistent diarrhea associated with it that has now developed into just water coming out.  In the emergency department she was fully evaluated.  She was found to have an elevated white blood cell count and a lipase of 274.  CT scan revealed fatty liver and surgically absent gallbladder with no ductal dilatation.  Given the fact that she had persistent vomiting and diarrhea in the emergency department she was given 500 mL of normal saline referred to me.  She was hoping to be able to go home as she had a soft abdomen with quiet but positive bowel sounds gave her some sips of clears but she said she just could not tolerate it felt so nauseous when she tried.  Given this we will trial of p.o.'s patient will be observed in the hospital for further evaluation and management    Review of Systems: As per HPI otherwise complete review of systems negative.    Past Medical History:  Diagnosis Date  . ALLERGIC RHINITIS   . Anxiety   .  Asthma   . Blood type O+   . Cervical strain, acute   . Colon polyp 2010   adenoma  . Diverticulosis of colon   . Dizziness   . Fibromyalgia   . GERD (gastroesophageal reflux disease)   . History of nephrolithiasis   . Hypercholesterolemia    borderline  . Hypertension   . IBS (irritable bowel syndrome)   . Osteoarthritis   . Personal history of allergy to unspecified medicinal agent   . Postconcussion syndrome   . Vitamin D deficiency     Past Surgical History:  Procedure Laterality Date  . CHOLECYSTECTOMY, LAPAROSCOPIC  2004   for gallstone pancreatitis  . KNEE SURGERY Right   . KNEE SURGERY Left   . THYROID SURGERY       reports that she quit smoking about 55 years ago. Her smoking use included cigarettes. She has a 16.00 pack-year smoking history. she has never used smokeless tobacco. She reports that she does not drink alcohol or use drugs.  Allergies  Allergen Reactions  . Ace Inhibitors   . Amoxicillin Itching    REACTION: unknown? pain in right kidney  . Benicar Hct [Olmesartan Medoxomil-Hctz]     Extreme weakness  . Budesonide-Formoterol Fumarate     Causes tremors and numbness  . Chlorzoxazone [Chlorzoxazone]   . Citalopram Hydrobromide     REACTION: hives  . Citalopram Hydrobromide   . Clonidine Derivatives   . Droperidol     REACTION: hives  .  Flexeril [Cyclobenzaprine Hcl]   . Ketorolac Tromethamine     REACTION: hives  . Ketorolac Tromethamine   . Metoclopramide Hcl   . Mometasone Furo-Formoterol Fum     Causes sore throat, blurred vision and unable to sleep--started on med 09-17-10  . Morphine     REACTION: hives and itching  . Olmesartan Medoxomil     REACTION: fatique  . Breo Ellipta [Fluticasone Furoate-Vilanterol] Itching    Pt reports itching with [employer] is related to being lactose intolerant.     Family History  Problem Relation Age of Onset  . Breast cancer Mother   . Alzheimer's disease Mother   . Heart disease Mother   . COPD  Brother   . Heart disease Sister   . Breast cancer Sister   . Alcohol abuse Sister   . Ovarian cancer Paternal Grandmother   . Arthritis Other        Cousins   . COPD Cousin        Maternal side   . Heart attack Maternal Uncle      Prior to Admission medications   Medication Sig Start Date End Date Taking? Authorizing Provider  acetaminophen (TYLENOL) 500 MG tablet Take 500 mg by mouth every 6 (six) hours as needed (for headache as needed).     [provider]  ADVAIR [facility] 790-24 MCG/ACT inhaler Inhale 2 puffs into the lungs 2 (two) times daily.  08/17/16   [provider]  albuterol (PROVENTIL HFA;VENTOLIN HFA) 108 (90 Base) MCG/ACT inhaler Inhale 2 puffs every 4 (four) hours as needed into the lungs for wheezing or shortness of breath. RESCUE 03/27/17 05/03/19  Lauree Chandler, NP  amLODipine (NORVASC) 5 MG tablet TAKE 1 TABLET BY MOUTH EVERY DAY FOR BLOOD PRESSURE 02/28/17   Lauree Chandler, NP  aspirin 81 MG EC tablet Take 81 mg by mouth daily.      [provider]  Cetirizine HCl (ZYRTEC ALLERGY) 10 MG CAPS Take 1 capsule by mouth daily.      [provider]  Cholecalciferol (VITAMIN D3) 1000 UNITS CAPS Take by mouth daily.      [provider]  esomeprazole (NEXIUM) 40 MG capsule Take 1 capsule (40 mg total) by mouth daily. 04/22/17   Lauree Chandler, NP  fluconazole (DIFLUCAN) 100 MG tablet Take 2 tablets by mouth on day one, then take 1 tablet by mouth every day thereafter until gone (14 days total) 06/30/17   Armbruster, Carlota Raspberry, MD  fluticasone (FLONASE) 50 MCG/ACT nasal spray SHAKE LIQUID AND USE ONE TO TWO SPRAYS IN EACH NOSTRIL TWICE DAILY 05/07/17   Lauree Chandler, NP  hydrochlorothiazide (MICROZIDE) 12.5 MG capsule TAKE 1 CAPSULE BY MOUTH EVERY DAY 02/28/17   Lauree Chandler, NP  meclizine (ANTIVERT) 25 MG tablet TAKE 1 TABLET 3 TIMES A DAY AS NEEDED FOR DIZZINESS/NAUSEA 05/23/16   Lauree Chandler, NP    methylcellulose (CITRUCEL) oral powder Take once a day 05/28/17   Armbruster, Carlota Raspberry, MD  Multiple Vitamins-Minerals (CENTRUM SILVER PO) Take by mouth daily.      [provider]  Omega-3 Fatty Acids (FISH OIL MAXIMUM STRENGTH) 1200 MG CAPS Take by mouth. 2 by mouth daily    [provider]  potassium chloride SA (KLOR-CON M20) 20 MEQ tablet Take 1 tablet (20 mEq total) by mouth daily. 02/28/17   Lauree Chandler, NP  predniSONE (DELTASONE) 20 MG tablet TAKE AS DIRECTED Patient taking differently: TAKE AS DIRECTED.  taking .5 every day 05/05/17   Noralee Space, MD  Propylene Glycol (SYSTANE BALANCE) 0.6 % SOLN Apply 1 drop to eye 2 (two) times daily.    [provider]  SALINE NASAL SPRAY NA Place 88 mcg into the nose 2 (two) times daily.    [provider]    Physical Exam: Vitals:   07/05/17 1330 07/05/17 1400 07/05/17 1415 07/05/17 1430  BP: (!) 134/59 121/71 (!) 134/53 (!) 126/58  Pulse: 100 (!) 114 (!) 103 100  Resp:   16 19  Temp:      TempSrc:      SpO2: 93% 93% 94% 93%   .TCS Constitutional: NAD, calm, comfortable Vitals:   07/05/17 1330 07/05/17 1400 07/05/17 1415 07/05/17 1430  BP: (!) 134/59 121/71 (!) 134/53 (!) 126/58  Pulse: 100 (!) 114 (!) 103 100  Resp:   16 19  Temp:      TempSrc:      SpO2: 93% 93% 94% 93%   Eyes: PERRL, lids and conjunctivae normal ENMT: Mucous membranes are dry.. Posterior pharynx clear of any exudate or lesions.Normal dentition.  Neck: normal, supple, no masses, no thyromegaly Respiratory: clear to auscultation bilaterally, no wheezing, no crackles. Normal respiratory effort. No accessory muscle use.  Cardiovascular: Regular rate and rhythm, no murmurs / rubs / gallops. No extremity edema. 2+ pedal pulses. No carotid bruits.  Abdomen: no tenderness, no masses palpated. No hepatosplenomegaly. Bowel sounds hypoactive but present Musculoskeletal: no clubbing / cyanosis. No joint deformity upper and lower  extremities. Good ROM, no contractures. Normal muscle tone.  Skin: no rashes, lesions, ulcers. No induration Neurologic: CN 2-12 grossly intact. Sensation intact, DTR normal. Strength 5/5 in all 4.  Psychiatric: Normal judgment and insight. Alert and oriented x 3. Normal mood.     Labs on Admission: I have personally reviewed following labs and imaging studies  CBC: Recent Labs  Lab 07/05/17 0917  WBC 20.2*  NEUTROABS 17.7*  HGB 15.7*  HCT 46.6*  MCV 88.8  PLT 470   Basic Metabolic Panel: Recent Labs  Lab 07/05/17 0917  NA 136  K 3.6  CL 100*  CO2 23  GLUCOSE 179*  BUN 26*  CREATININE 0.85  CALCIUM 8.9   GFR: Estimated Creatinine Clearance: 56.6 mL/min (by C-G formula based on SCr of 0.85 mg/dL). Liver Function Tests: Recent Labs  Lab 07/05/17 0917  AST 53*  ALT 42  ALKPHOS 71  BILITOT 0.9  PROT 6.4*  ALBUMIN 3.8   Recent Labs  Lab 07/05/17 0917  LIPASE 274*   No results for input(s): AMMONIA in the last 168 hours. Coagulation Profile: No results for input(s): INR, PROTIME in the last 168 hours. Cardiac Enzymes: No results for input(s): CKTOTAL, CKMB, CKMBINDEX, TROPONINI in the last 168 hours. BNP (last 3 results) No results for input(s): PROBNP in the last 8760 hours. HbA1C: No results for input(s): HGBA1C in the last 72 hours. CBG: No results for input(s): GLUCAP in the last 168 hours. Lipid Profile: No results for input(s): CHOL, HDL, LDLCALC, TRIG, CHOLHDL, LDLDIRECT in the last 72 hours. Thyroid Function Tests: No results for input(s): TSH, T4TOTAL, FREET4, T3FREE, THYROIDAB in the last 72 hours. Anemia Panel: No results for input(s): VITAMINB12, FOLATE, FERRITIN, TIBC, IRON, RETICCTPCT in the last 72 hours. Urine analysis:    Component Value Date/Time   COLORURINE YELLOW 07/05/2017 [place] 07/05/2017 1146   LABSPEC 1.043 (H) 07/05/2017 1146   PHURINE 5.0 07/05/2017 1146  GLUCOSEU NEGATIVE 07/05/2017 [place] 12/31/2011 1512   HGBUR NEGATIVE 07/05/2017 1146   BILIRUBINUR NEGATIVE 07/05/2017 1146   BILIRUBINUR Neg 10/23/2015 1510   KETONESUR NEGATIVE 07/05/2017 1146   PROTEINUR NEGATIVE 07/05/2017 1146   UROBILINOGEN 0.2 10/23/2015 1510   UROBILINOGEN 0.2 12/31/2011 1512   NITRITE NEGATIVE 07/05/2017 1146   LEUKOCYTESUR TRACE (A) 07/05/2017 1146    Radiological Exams on Admission: Ct Abdomen Pelvis W Contrast  Result Date: 07/05/2017 CLINICAL DATA:  Patient awoke this morning with abdominal distention with nausea and vomiting. EXAM: CT ABDOMEN AND PELVIS WITH CONTRAST TECHNIQUE: Multidetector CT imaging of the abdomen and pelvis was performed using the standard protocol following bolus administration of intravenous contrast. CONTRAST:  17m ISOVUE-300 IOPAMIDOL (ISOVUE-300) INJECTION 61% COMPARISON:  08/24/2008.  Ultrasound, 07/04/2017. FINDINGS: Lower chest: Choose 1 Hepatobiliary: Liver shows decreased attenuation diffusely consistent with fatty infiltration. There is some central volume loss which raises the possibility of cirrhosis as suggested on the previous day's ultrasound. No liver mass or focal lesion. Liver normal in size. The gallbladder surgically absent. No bile duct dilation. Pancreas: Unremarkable. No pancreatic ductal dilatation or surrounding inflammatory changes. Spleen: Normal in size without focal abnormality. Adrenals/Urinary Tract: No adrenal masses. Bilateral renal cysts, a single cyst from the posterior midpole the right kidney measuring 3.5 cm. Three cysts on the left, largest from the lower pole measuring 3.6 cm. 4 mm fat density lesion from the upper pole the right kidney is likely an angiomyolipoma. There is a low-density curved lesion in the midpole the left kidney that may reflect a dilated calyx. No other renal lesions. No stones. No hydronephrosis. Ureters are normal course and in caliber.  Bladder is unremarkable. Stomach/Bowel: Stomach is within normal limits.  Appendix appears normal. No evidence of bowel wall thickening, distention, or inflammatory changes. Vascular/Lymphatic: Aortic atherosclerosis. No enlarged abdominal or pelvic lymph nodes. Reproductive: Uterus and bilateral adnexa are unremarkable. Other: No abdominal wall hernia or abnormality. No abdominopelvic ascites. Musculoskeletal: No fracture or acute finding. No osteoblastic or osteolytic lesions. IMPRESSION: 1. No acute findings within the abdomen or pelvis. No findings to account for the patient's symptoms. No bowel inflammation or obstruction. 2. Hepatic steatosis. 3. Bilateral renal cysts. 4. Aortic atherosclerosis. Electronically Signed   By: DLajean ManesM.D.   On: 07/05/2017 11:40   UK[place]Abdomen Limited Ruq  Result Date: 07/04/2017 CLINICAL DATA:  Esophageal varices.  Evaluate for cirrhosis EXAM: ULTRASOUND ABDOMEN LIMITED RIGHT UPPER QUADRANT COMPARISON:  09/26/2015 FINDINGS: Gallbladder: Prior cholecystectomy Common bile duct: Diameter: Normal caliber for post cholecystectomy state and patient's age, 7 mm. Liver: Heterogeneous, increased echotexture throughout the liver. Suspect subtle nodularity to the liver contours compatible with early cirrhosis. No focal hepatic abnormality. Portal vein is patent on color Doppler imaging with normal direction of blood flow towards the liver. IMPRESSION: Heterogeneous increased echotexture throughout the liver compatible with fatty infiltration or intrinsic liver disease. Subtle nodularity to the liver surface may reflect early cirrhosis. Electronically Signed   By: KRolm BaptiseM.D.   On: 07/04/2017 10:42    EKG: Independently reviewed.  Sinus tachycardia personally viewed by me  Assessment/Plan Principal Problem:   Pancreatitis Active Problems:   Vitamin D deficiency   Essential hypertension   GERD   IBS   Osteoarthritis   1.  Pancreatitis: This appears to be a fairly mild case.  Patient will be observed in the hospital.  We will give her IV  fluids.  She is having some bowel  sounds and passing some gas and this will resolve fairly quickly.  Continue with IV fluids for the next 26 hours.  Will monitor blood work recheck lipase in a.m.  For right now she is n.p.o. except for medications.  If she has significant improvement will consider advancing diet.  2.  Vitamin D deficiency: Patient currently takes replacement treatment at home will hold this if she cannot tolerate p.o. right now.  3.  Essential hypertension: Blood pressure medications currently on hold will order IV hydralazine as needed.  4.  Gastroesophageal reflux disease: We will add IV Protonix to her regimen for now.  She cannot take her oral Nexium.  5.  IBS: Noted.  Conservative management.  6.  Osteoarthritis: I ordered IV Toradol but the patient cannot tolerate it.  Will monitor closely and order stronger medicine if necessary.  We could also try some p.o. Tylenol.  DVT prophylaxis: SCDs patient in observation Code Status: Full Family Communication: Patient lives alone when asked about contacting family members she declined. Disposition Plan: Likely home in 24-48 hours Consults called: None Admission status: Observation   Lady Deutscher MD FACP [facility] Pager [phone]  If 7PM-7AM, please contact night-coverage [email or web address] Password [facility]  07/05/2017, 3:06 PM

## 2017-07-05 NOTE — ED Notes (Signed)
Patient tolerating ice chips.  Patient declined chicken broth after one sip stating "I think that is too much right now".

## 2017-07-05 NOTE — ED Provider Notes (Signed)
[facility] DEPARTMENT Provider Note   CSN: [id number] Arrival date & time: 07/05/17  7096     History   Chief Complaint Chief Complaint  Patient presents with  . Emesis    HPI [name] is a 77 y.o. female.  Complains of some abdominal discomfort vomiting and loose stools.   The history is provided by the patient.  Emesis   This is a new problem. The current episode started 12 to 24 hours ago. The problem has not changed since onset.The emesis has an appearance of stomach contents. There has been no fever. Associated symptoms include abdominal pain and diarrhea. Pertinent negatives include no chills, no cough and no headaches.    Past Medical History:  Diagnosis Date  . ALLERGIC RHINITIS   . Anxiety   . Asthma   . Blood type O+   . Cervical strain, acute   . Colon polyp 2010   adenoma  . Diverticulosis of colon   . Dizziness   . Fibromyalgia   . GERD (gastroesophageal reflux disease)   . History of nephrolithiasis   . Hypercholesterolemia    borderline  . Hypertension   . IBS (irritable bowel syndrome)   . Osteoarthritis   . Personal history of allergy to unspecified medicinal agent   . Postconcussion syndrome   . Vitamin D deficiency     Patient Active Problem List   Diagnosis Date Noted  . Chronic pansinusitis 12/02/2016  . Asthma, allergic, mild intermittent, uncomplicated 28/36/6294  . Environmental and seasonal allergies 12/02/2016  . Obesity (BMI 30.0-34.9) 12/02/2016  . Urine frequency 06/26/2016  . Obese 06/26/2016  . Hyperglycemia 11/13/2015  . Numbness and tingling of both legs below knees 05/24/2014  . Dupuytren's contracture of both hands 01/18/2014  . Dyslipidemia 10/08/2011  . Asthmatic bronchitis 04/25/2011  . Acute sinusitis, unspecified 12/20/2010  . Back pain 07/12/2010  . NEPHROLITHIASIS 04/26/2009  . Vitamin D deficiency 10/30/2008  . COLONIC POLYPS 10/22/2007  . Diverticulosis of colon  10/22/2007  . Postconcussion syndrome 07/10/2007  . Seasonal and perennial allergic rhinitis 06/13/2007  . GERD 06/13/2007  . IBS 06/13/2007  . FIBROMYALGIA 06/13/2007  . PERSONAL HISTORY ALLERGY UNSPEC MEDICINAL AGENT 06/13/2007  . Anxiety state 05/04/2007  . Essential hypertension 05/04/2007  . Osteoarthritis 05/04/2007  . DIZZINESS 04/28/2007    Past Surgical History:  Procedure Laterality Date  . CHOLECYSTECTOMY, LAPAROSCOPIC  2004   for gallstone pancreatitis  . KNEE SURGERY Right   . KNEE SURGERY Left   . THYROID SURGERY      OB History    No data available       Home Medications    Prior to Admission medications   Medication Sig Start Date End Date Taking? Authorizing Provider  acetaminophen (TYLENOL) 500 MG tablet Take 500 mg by mouth every 6 (six) hours as needed (for headache as needed).     [provider]  ADVAIR [facility] 765-46 MCG/ACT inhaler Inhale 2 puffs into the lungs 2 (two) times daily.  08/17/16   [provider]  albuterol (PROVENTIL HFA;VENTOLIN HFA) 108 (90 Base) MCG/ACT inhaler Inhale 2 puffs every 4 (four) hours as needed into the lungs for wheezing or shortness of breath. RESCUE 03/27/17 05/03/19  Lauree Chandler, NP  amLODipine (NORVASC) 5 MG tablet TAKE 1 TABLET BY MOUTH EVERY DAY FOR BLOOD PRESSURE 02/28/17   Lauree Chandler, NP  aspirin 81 MG EC tablet Take 81 mg by mouth daily.  [provider]  Cetirizine HCl (ZYRTEC ALLERGY) 10 MG CAPS Take 1 capsule by mouth daily.      [provider]  Cholecalciferol (VITAMIN D3) 1000 UNITS CAPS Take by mouth daily.      [provider]  esomeprazole (NEXIUM) 40 MG capsule Take 1 capsule (40 mg total) by mouth daily. 04/22/17   Lauree Chandler, NP  fluconazole (DIFLUCAN) 100 MG tablet Take 2 tablets by mouth on day one, then take 1 tablet by mouth every day thereafter until gone (14 days total) 06/30/17   Armbruster, Carlota Raspberry, MD  fluticasone (FLONASE) 50  MCG/ACT nasal spray SHAKE LIQUID AND USE ONE TO TWO SPRAYS IN EACH NOSTRIL TWICE DAILY 05/07/17   Lauree Chandler, NP  hydrochlorothiazide (MICROZIDE) 12.5 MG capsule TAKE 1 CAPSULE BY MOUTH EVERY DAY 02/28/17   Lauree Chandler, NP  meclizine (ANTIVERT) 25 MG tablet TAKE 1 TABLET 3 TIMES A DAY AS NEEDED FOR DIZZINESS/NAUSEA 05/23/16   Lauree Chandler, NP  methylcellulose (CITRUCEL) oral powder Take once a day 05/28/17   Armbruster, Carlota Raspberry, MD  Multiple Vitamins-Minerals (CENTRUM SILVER PO) Take by mouth daily.      [provider]  Omega-3 Fatty Acids (FISH OIL MAXIMUM STRENGTH) 1200 MG CAPS Take by mouth. 2 by mouth daily    [provider]  potassium chloride SA (KLOR-CON M20) 20 MEQ tablet Take 1 tablet (20 mEq total) by mouth daily. 02/28/17   Lauree Chandler, NP  predniSONE (DELTASONE) 20 MG tablet TAKE AS DIRECTED Patient taking differently: TAKE AS DIRECTED. taking .5 every day 05/05/17   Noralee Space, MD  Propylene Glycol (SYSTANE BALANCE) 0.6 % SOLN Apply 1 drop to eye 2 (two) times daily.    [provider]  SALINE NASAL SPRAY NA Place 88 mcg into the nose 2 (two) times daily.    [provider]    Family History Family History  Problem Relation Age of Onset  . Breast cancer Mother   . Alzheimer's disease Mother   . Heart disease Mother   . COPD Brother   . Heart disease Sister   . Breast cancer Sister   . Alcohol abuse Sister   . Ovarian cancer Paternal Grandmother   . Arthritis Other        Cousins   . COPD Cousin        Maternal side   . Heart attack Maternal Uncle     Social History Social History   Tobacco Use  . Smoking status: Former Smoker    Packs/day: 2.00    Years: 8.00    Pack years: 16.00    Types: Cigarettes    Last attempt to quit: 05/13/1962    Years since quitting: 55.1  . Smokeless tobacco: Never Used  Substance Use Topics  . Alcohol use: No  . Drug use: No     Allergies   Ace inhibitors;  Amoxicillin; Benicar hct [olmesartan medoxomil-hctz]; Budesonide-formoterol fumarate; Chlorzoxazone [chlorzoxazone]; Citalopram hydrobromide; Citalopram hydrobromide; Clonidine derivatives; Droperidol; Flexeril [cyclobenzaprine hcl]; Ketorolac tromethamine; Ketorolac tromethamine; Metoclopramide hcl; Mometasone furo-formoterol fum; Morphine; Olmesartan medoxomil; and Breo ellipta [fluticasone furoate-vilanterol]   Review of Systems Review of Systems  Constitutional: Negative for appetite change, chills and fatigue.  HENT: Negative for congestion, ear discharge and sinus pressure.   Eyes: Negative for discharge.  Respiratory: Negative for cough.   Cardiovascular: Negative for chest pain.  Gastrointestinal: Positive for abdominal pain, diarrhea and vomiting.  Genitourinary: Negative for frequency and  hematuria.  Musculoskeletal: Negative for back pain.  Skin: Negative for rash.  Neurological: Negative for seizures and headaches.  Psychiatric/Behavioral: Negative for hallucinations.     Physical Exam Updated Vital Signs BP 133/83   Pulse 94   Temp 98.1 F (36.7 C) (Oral)   Resp 15   SpO2 93%   Physical Exam  Constitutional: She is oriented to person, place, and time. She appears well-developed.  HENT:  Head: Normocephalic.  Eyes: Conjunctivae and EOM are normal. No scleral icterus.  Neck: Neck supple. No thyromegaly present.  Cardiovascular: Normal rate and regular rhythm. Exam reveals no gallop and no friction rub.  No murmur heard. Pulmonary/Chest: No stridor. She has no wheezes. She has no rales. She exhibits no tenderness.  Abdominal: She exhibits no distension. There is tenderness. There is no rebound.  Patient with mild tenderness throughout  Musculoskeletal: Normal range of motion. She exhibits no edema.  Lymphadenopathy:    She has no cervical adenopathy.  Neurological: She is oriented to person, place, and time. She exhibits normal muscle tone. Coordination normal.    Skin: No rash noted. No erythema.  Psychiatric: She has a normal mood and affect. Her behavior is normal.     ED Treatments / Results  Labs (all labs ordered are listed, but only abnormal results are displayed) Labs Reviewed  LIPASE, BLOOD - Abnormal; Notable for the following components:      Result Value   Lipase 274 (*)    All other components within normal limits  COMPREHENSIVE METABOLIC PANEL - Abnormal; Notable for the following components:   Chloride 100 (*)    Glucose, Bld 179 (*)    BUN 26 (*)    Total Protein 6.4 (*)    AST 53 (*)    All other components within normal limits  CBC - Abnormal; Notable for the following components:   WBC 20.2 (*)    RBC 5.25 (*)    Hemoglobin 15.7 (*)    HCT 46.6 (*)    All other components within normal limits  URINALYSIS, ROUTINE W REFLEX MICROSCOPIC - Abnormal; Notable for the following components:   Specific Gravity, Urine 1.043 (*)    Leukocytes, UA TRACE (*)    Bacteria, UA RARE (*)    Squamous Epithelial / LPF 0-5 (*)    All other components within normal limits  DIFFERENTIAL - Abnormal; Notable for the following components:   Neutro Abs 17.7 (*)    Monocytes Absolute 1.1 (*)    All other components within normal limits    EKG  EKG Interpretation None       Radiology Ct Abdomen Pelvis W Contrast  Result Date: 07/05/2017 CLINICAL DATA:  Patient awoke this morning with abdominal distention with nausea and vomiting. EXAM: CT ABDOMEN AND PELVIS WITH CONTRAST TECHNIQUE: Multidetector CT imaging of the abdomen and pelvis was performed using the standard protocol following bolus administration of intravenous contrast. CONTRAST:  126m ISOVUE-300 IOPAMIDOL (ISOVUE-300) INJECTION 61% COMPARISON:  08/24/2008.  Ultrasound, 07/04/2017. FINDINGS: Lower chest: Choose 1 Hepatobiliary: Liver shows decreased attenuation diffusely consistent with fatty infiltration. There is some central volume loss which raises the possibility of  cirrhosis as suggested on the previous day's ultrasound. No liver mass or focal lesion. Liver normal in size. The gallbladder surgically absent. No bile duct dilation. Pancreas: Unremarkable. No pancreatic ductal dilatation or surrounding inflammatory changes. Spleen: Normal in size without focal abnormality. Adrenals/Urinary Tract: No adrenal masses. Bilateral renal cysts, a single cyst from the posterior midpole  the right kidney measuring 3.5 cm. Three cysts on the left, largest from the lower pole measuring 3.6 cm. 4 mm fat density lesion from the upper pole the right kidney is likely an angiomyolipoma. There is a low-density curved lesion in the midpole the left kidney that may reflect a dilated calyx. No other renal lesions. No stones. No hydronephrosis. Ureters are normal course and in caliber.  Bladder is unremarkable. Stomach/Bowel: Stomach is within normal limits. Appendix appears normal. No evidence of bowel wall thickening, distention, or inflammatory changes. Vascular/Lymphatic: Aortic atherosclerosis. No enlarged abdominal or pelvic lymph nodes. Reproductive: Uterus and bilateral adnexa are unremarkable. Other: No abdominal wall hernia or abnormality. No abdominopelvic ascites. Musculoskeletal: No fracture or acute finding. No osteoblastic or osteolytic lesions. IMPRESSION: 1. No acute findings within the abdomen or pelvis. No findings to account for the patient's symptoms. No bowel inflammation or obstruction. 2. Hepatic steatosis. 3. Bilateral renal cysts. 4. Aortic atherosclerosis. Electronically Signed   By: Lajean Manes M.D.   On: 07/05/2017 11:40   US Abdomen Limited Ruq  Result Date: 07/04/2017 CLINICAL DATA:  Esophageal varices.  Evaluate for cirrhosis EXAM: ULTRASOUND ABDOMEN LIMITED RIGHT UPPER QUADRANT COMPARISON:  09/26/2015 FINDINGS: Gallbladder: Prior cholecystectomy Common bile duct: Diameter: Normal caliber for post cholecystectomy state and patient's age, 7 mm. Liver:  Heterogeneous, increased echotexture throughout the liver. Suspect subtle nodularity to the liver contours compatible with early cirrhosis. No focal hepatic abnormality. Portal vein is patent on color Doppler imaging with normal direction of blood flow towards the liver. IMPRESSION: Heterogeneous increased echotexture throughout the liver compatible with fatty infiltration or intrinsic liver disease. Subtle nodularity to the liver surface may reflect early cirrhosis. Electronically Signed   By: Rolm Baptise M.D.   On: 07/04/2017 10:42    Procedures Procedures (including critical care time)  Medications Ordered in ED Medications  iopamidol (ISOVUE-300) 61 % injection (not administered)  sodium chloride 0.9 % bolus 500 mL (0 mLs Intravenous Stopped 07/05/17 1050)  pantoprazole (PROTONIX) injection 40 mg (40 mg Intravenous Given 07/05/17 0940)  iopamidol (ISOVUE-300) 61 % injection (100 mLs  Contrast Given 07/05/17 1108)     Initial Impression / Assessment and Plan / ED Course  I have reviewed the triage vital signs and the nursing notes.  Pertinent labs & imaging results that were available during my care of the patient were reviewed by me and considered in my medical decision making (see chart for details).  Lab test shows lipase elevated.  Glucose 179 white count 20,000.  CT scan unremarkable.  Patient with mild pancreatitis.  She will be evaluated by the hospitalist to decide admission  Final Clinical Impressions(s) / ED Diagnoses   Final diagnoses:  None    ED Discharge Orders    None       Milton Ferguson, MD 07/05/17 1341

## 2017-07-05 NOTE — ED Triage Notes (Signed)
Patient from home with [facility] with complaint of nausea, vomiting, and diarrhea. Patient states she felt like she had acid reflux last night, took tums. Woke up at 0300 this morning with vomiting and diarrhea. Patient alert and oriented at this time. 18g saline lock in left AC. Patient hypertensive, has not taken medications today. No vomiting or diarrhea since EMS arrived on scene. Denies nausea at this time, only 7/10 "discomfort" in abdomen.

## 2017-07-06 ENCOUNTER — Observation Stay (HOSPITAL_COMMUNITY): Payer: PPO

## 2017-07-06 DIAGNOSIS — A084 Viral intestinal infection, unspecified: Secondary | ICD-10-CM | POA: Diagnosis not present

## 2017-07-06 DIAGNOSIS — I10 Essential (primary) hypertension: Secondary | ICD-10-CM

## 2017-07-06 DIAGNOSIS — K859 Acute pancreatitis without necrosis or infection, unspecified: Secondary | ICD-10-CM | POA: Diagnosis not present

## 2017-07-06 LAB — CBC
HEMATOCRIT: 39.6 % (ref 36.0–46.0)
HEMOGLOBIN: 12.9 g/dL (ref 12.0–15.0)
MCH: 29.3 pg (ref 26.0–34.0)
MCHC: 32.6 g/dL (ref 30.0–36.0)
MCV: 89.8 fL (ref 78.0–100.0)
Platelets: 232 10*3/uL (ref 150–400)
RBC: 4.41 MIL/uL (ref 3.87–5.11)
RDW: 13.9 % (ref 11.5–15.5)
WBC: 8.8 10*3/uL (ref 4.0–10.5)

## 2017-07-06 LAB — BASIC METABOLIC PANEL
Anion gap: 9 (ref 5–15)
BUN: 23 mg/dL — AB (ref 6–20)
CO2: 23 mmol/L (ref 22–32)
Calcium: 7.8 mg/dL — ABNORMAL LOW (ref 8.9–10.3)
Chloride: 106 mmol/L (ref 101–111)
Creatinine, Ser: 0.67 mg/dL (ref 0.44–1.00)
GFR calc Af Amer: >60 mL/min (ref >60)
GFR calc non Af Amer: >60 mL/min (ref >60)
GLUCOSE: 113 mg/dL — AB (ref 65–99)
POTASSIUM: 3.6 mmol/L (ref 3.5–5.1)
Sodium: 138 mmol/L (ref 135–145)

## 2017-07-06 LAB — LIPASE, BLOOD: Lipase: 29 U/L (ref 11–51)

## 2017-07-06 MED ORDER — PREDNISONE 10 MG PO TABS
10.0000 mg | ORAL_TABLET | Freq: Once | ORAL | Status: AC
  Administered 2017-07-06: 10 mg via ORAL
  Filled 2017-07-06: qty 1

## 2017-07-06 NOTE — Care Management Obs Status (Signed)
[place] NOTIFICATION   Patient Details  Name: [name] MRN: [id number] Date of Birth: [birth year]   Medicare Observation Status Notification Given:  Yes    Norina Buzzard, RN 07/06/2017, 2:03 PM

## 2017-07-06 NOTE — Discharge Summary (Signed)
Physician Discharge Summary  [name] VWU:[id number] DOB: 03/18/1941  PCP: Carla Chandler, NP  Admit date: 07/05/2017 Discharge date: 07/06/2017  Recommendations for Outpatient Follow-up:  1. Carla Mustache, NP/PCP: Patient has prior appointment on 07/16/17 at 1:30 PM and is advised to keep that appointment.  To be seen with repeat labs (CBC & CMP). 2. [name], Home GI.  Home Health: None Equipment/Devices: None  Discharge Condition: Improved and stable CODE STATUS: Full Diet recommendation: Heart healthy diet.  Discharge Diagnoses:  Principal Problem:   Pancreatitis Active Problems:   Vitamin D deficiency   Essential hypertension   GERD   IBS   Osteoarthritis   Brief Summary: 77 year old female, lives in an independent living facility, at times uses a cane for ambulation, PMH of HTN, HLD, colon adenomas, GERD, reported IBS, diverticulosis, EGD 06/25/17 showed no Barrett's esophagus, esophageal candidiasis, suspected esophageal varices, multiple benign-appearing gastric polyps which were resected/biopsied, colonoscopy 06/25/17: External hemorrhoids, multiple polyps removed, external and internal hemorrhoids and diverticulosis noted, underwent ultrasound abdomen 07/04/17 which showed fatty liver versus intrinsic liver disease and possible early cirrhosis, after returning from the ultrasound ate a big breakfast at [employer] including some fries which he rarely does for breakfast and then barbecued chicken later that day 07/04/17, woke up on 07/05/17 at 3 AM with intractable nausea, nonbloody emesis, abdominal discomfort and several episodes of watery green/yellow diarrhea without fever or chills.  Pepto-Bismol did not provide much relief.  She had associated heartburn.  In the ED, lipase 274 and CT abdomen and pelvis showed no acute findings.  Due to inability to tolerate food, she was admitted for observation and evaluation.  Assessment and plan:  1. Acute viral  gastroenteritis versus food poisoning: Treated supportively with bowel rest, IV fluids.  Resolved and no further nausea, vomiting, abdominal pain or diarrhea since ED yesterday afternoon.  Tolerated soft diet today.  Despite elevated lipase, less likely to be acute pancreatitis.  Lipase was possibly elevated due to acute GE and promptly normalized.  CT abdomen without acute findings. 2. Leukocytosis: Possibly stress response.  Promptly resolved.  No clinical focus of ongoing infection. 3. Candida esophagitis: Recently diagnosed by EGD.  Complete course of Diflucan and outpatient follow-up with GI. 4. Essential hypertension: Antihypertensives were temporarily held due to GI losses.  Resume at discharge. 5. GERD: Continue PPI. 6. Asthma/allergic rhinitis: Stable without clinical bronchospasm.  Patient is on chronic prednisone, continue. 7. Fatty liver/possible early cirrhosis: Recent EGD showed esophageal varices.  Outpatient follow-up with GI.  Consultations:  None  Procedures:  None   Discharge Instructions  Discharge Instructions    Call MD for:   Complete by:  As directed    Recurrent nausea, vomiting or diarrhea.   Call MD for:  extreme fatigue   Complete by:  As directed    Call MD for:  persistant dizziness or light-headedness   Complete by:  As directed    Call MD for:  severe uncontrolled pain   Complete by:  As directed    Call MD for:  temperature >100.4   Complete by:  As directed    Diet - low sodium heart healthy   Complete by:  As directed    Increase activity slowly   Complete by:  As directed        Medication List    TAKE these medications   acetaminophen 500 MG tablet Commonly known as:  TYLENOL Take 500 mg by mouth every 6 (six) hours as  needed (for headache as needed).   ADVAIR HFA 115-21 MCG/ACT inhaler Generic drug:  fluticasone-salmeterol Inhale 2 puffs into the lungs 2 (two) times daily.   albuterol 108 (90 Base) MCG/ACT inhaler Commonly known  as:  PROVENTIL HFA;VENTOLIN HFA Inhale 2 puffs every 4 (four) hours as needed into the lungs for wheezing or shortness of breath. RESCUE   amLODipine 5 MG tablet Commonly known as:  NORVASC TAKE 1 TABLET BY MOUTH EVERY DAY FOR BLOOD PRESSURE   aspirin 81 MG EC tablet Take 81 mg by mouth daily.   CENTRUM SILVER PO Take 1 tablet by mouth daily.   esomeprazole 40 MG capsule Commonly known as:  NEXIUM Take 1 capsule (40 mg total) by mouth daily.   FISH OIL MAXIMUM STRENGTH 1200 MG Caps Take 1,200 mg by mouth 2 (two) times daily.   fluconazole 100 MG tablet Commonly known as:  DIFLUCAN Take 2 tablets by mouth on day one, then take 1 tablet by mouth every day thereafter until gone (14 days total) What changed:    how much to take  how to take this  when to take this  additional instructions   fluticasone 50 MCG/ACT nasal spray Commonly known as:  FLONASE SHAKE LIQUID AND USE ONE TO TWO SPRAYS IN EACH NOSTRIL TWICE DAILY   hydrochlorothiazide 12.5 MG capsule Commonly known as:  MICROZIDE TAKE 1 CAPSULE BY MOUTH EVERY DAY What changed:    how much to take  how to take this  when to take this  additional instructions   meclizine 25 MG tablet Commonly known as:  ANTIVERT TAKE 1 TABLET 3 TIMES A DAY AS NEEDED FOR DIZZINESS/NAUSEA   methylcellulose oral powder Commonly known as:  CITRUCEL Take once a day What changed:    how much to take  how to take this  when to take this  additional instructions   potassium chloride SA 20 MEQ tablet Commonly known as:  KLOR-CON M20 Take 1 tablet (20 mEq total) by mouth daily.   predniSONE 20 MG tablet Commonly known as:  DELTASONE TAKE AS DIRECTED What changed:  when to take this   SALINE NASAL SPRAY NA Place 1 spray into both nostrils 2 (two) times daily.   SYSTANE BALANCE 0.6 % Soln Generic drug:  Propylene Glycol Place 1 drop into both ears 2 (two) times daily.   Vitamin D3 1000 units Caps Take 1,000 Units  by mouth daily.   ZYRTEC ALLERGY 10 MG Caps Generic drug:  Cetirizine HCl Take 10 mg by mouth daily.      Follow-up Information    Carla Chandler, NP Follow up on 07/16/2017.   Specialty:  Geriatric Medicine Why:  Keep prior appointment that you have at 1:30 PM.  To be seen with repeat labs (CBC & CMP). Contact information: [address] [place] [address] [phone]        Armbruster, Carlota Raspberry, MD. Schedule an appointment as soon as possible for a visit.   Specialty:  Gastroenterology Contact information: [address] [address] [phone]          Allergies  Allergen Reactions  . Ace Inhibitors   . Amoxicillin Itching    REACTION: unknown? pain in right kidney  . Benicar Hct [Olmesartan Medoxomil-Hctz]     Extreme weakness  . Budesonide-Formoterol Fumarate     Causes tremors and numbness  . Chlorzoxazone [Chlorzoxazone]   . Citalopram Hydrobromide     REACTION: hives  .  Citalopram Hydrobromide   . Clonidine Derivatives   . Droperidol     REACTION: hives  . Flexeril [Cyclobenzaprine Hcl]   . Ketorolac Tromethamine     REACTION: hives  . Ketorolac Tromethamine   . Metoclopramide Hcl   . Mometasone Furo-Formoterol Fum     Causes sore throat, blurred vision and unable to sleep--started on med 09-17-10  . Morphine     REACTION: hives and itching  . Olmesartan Medoxomil     REACTION: fatique  . Breo Ellipta [Fluticasone Furoate-Vilanterol] Itching    Pt reports itching with Memory [place] is related to being lactose intolerant.       Procedures/Studies: Ct Abdomen Pelvis W Contrast  Result Date: 07/05/2017 CLINICAL DATA:  Patient awoke this morning with abdominal distention with nausea and vomiting. EXAM: CT ABDOMEN AND PELVIS WITH CONTRAST TECHNIQUE: Multidetector CT imaging of the abdomen and pelvis was performed using the standard protocol following bolus administration of intravenous contrast. CONTRAST:  126m ISOVUE-300  IOPAMIDOL (ISOVUE-300) INJECTION 61% COMPARISON:  08/24/2008.  Ultrasound, 07/04/2017. FINDINGS: Lower chest: Choose 1 Hepatobiliary: Liver shows decreased attenuation diffusely consistent with fatty infiltration. There is some central volume loss which raises the possibility of cirrhosis as suggested on the previous day's ultrasound. No liver mass or focal lesion. Liver normal in size. The gallbladder surgically absent. No bile duct dilation. Pancreas: Unremarkable. No pancreatic ductal dilatation or surrounding inflammatory changes. Spleen: Normal in size without focal abnormality. Adrenals/Urinary Tract: No adrenal masses. Bilateral renal cysts, a single cyst from the posterior midpole the right kidney measuring 3.5 cm. Three cysts on the left, largest from the lower pole measuring 3.6 cm. 4 mm fat density lesion from the upper pole the right kidney is likely an angiomyolipoma. There is a low-density curved lesion in the midpole the left kidney that may reflect a dilated calyx. No other renal lesions. No stones. No hydronephrosis. Ureters are normal course and in caliber.  Bladder is unremarkable. Stomach/Bowel: Stomach is within normal limits. Appendix appears normal. No evidence of bowel wall thickening, distention, or inflammatory changes. Vascular/Lymphatic: Aortic atherosclerosis. No enlarged abdominal or pelvic lymph nodes. Reproductive: Uterus and bilateral adnexa are unremarkable. Other: No abdominal wall hernia or abnormality. No abdominopelvic ascites. Musculoskeletal: No fracture or acute finding. No osteoblastic or osteolytic lesions. IMPRESSION: 1. No acute findings within the abdomen or pelvis. No findings to account for the patient's symptoms. No bowel inflammation or obstruction. 2. Hepatic steatosis. 3. Bilateral renal cysts. 4. Aortic atherosclerosis. Electronically Signed   By: DLajean ManesM.D.   On: 07/05/2017 11:40   Dg Abd Portable 1v  Result Date: 07/06/2017 CLINICAL DATA:   Pancreatitis.  Fever, vomiting and diarrhea. EXAM: PORTABLE ABDOMEN - 1 VIEW COMPARISON:  CT yesterday. FINDINGS: Gas pattern is normal without evidence of ileus or obstruction. Surgical clips in the right upper quadrant consistent with previous cholecystectomy. Some aortic calcification. Chronic spinal curvature convex to the right. IMPRESSION: No acute finding.  Normal gas pattern. Electronically Signed   By: MNelson ChimesM.D.   On: 07/06/2017 07:35   UKoreaAbdomen Limited Ruq  Result Date: 07/04/2017 CLINICAL DATA:  Esophageal varices.  Evaluate for cirrhosis EXAM: ULTRASOUND ABDOMEN LIMITED RIGHT UPPER QUADRANT COMPARISON:  09/26/2015 FINDINGS: Gallbladder: Prior cholecystectomy Common bile duct: Diameter: Normal caliber for post cholecystectomy state and patient's age, 7 mm. Liver: Heterogeneous, increased echotexture throughout the liver. Suspect subtle nodularity to the liver contours compatible with early cirrhosis. No focal hepatic abnormality. Portal vein is patent on color Doppler  imaging with normal direction of blood flow towards the liver. IMPRESSION: Heterogeneous increased echotexture throughout the liver compatible with fatty infiltration or intrinsic liver disease. Subtle nodularity to the liver surface may reflect early cirrhosis. Electronically Signed   By: Rolm Baptise M.D.   On: 07/04/2017 10:42      Subjective: Patient states that she feels much better.  No nausea, vomiting, abdominal pain or BM since in the ED yesterday afternoon.  Tolerated soft diet without difficulty.  No chest pain, dyspnea, dizziness, lightheadedness, fever, chills, dysuria or urinary frequency.  Discharge Exam:  Vitals:   07/05/17 2022 07/05/17 2148 07/06/17 0542 07/06/17 0954  BP:  (!) 151/71 (!) 136/55   Pulse:  94 73   Resp:  18 18   Temp:  98.8 F (37.1 C) 98.1 F (36.7 C)   TempSrc:  Oral Oral   SpO2: 96% 94% 94% 97%  Weight:   88.8 kg (195 lb 12.3 oz)     General: Pleasant elderly female,  moderately built and nourished, lying comfortably supine in bed.  Oral mucosa moist. Cardiovascular: S1 & S2 heard, RRR, S1/S2 +. No murmurs, rubs, gallops or clicks. No JVD.  Trace bilateral ankle edema. Respiratory: Clear to auscultation without wheezing, rhonchi or crackles. No increased work of breathing. Abdominal:  Non distended, non tender & soft. No organomegaly or masses appreciated. Normal bowel sounds heard. CNS: Alert and oriented. No focal deficits. Extremities: no edema, no cyanosis    The results of significant diagnostics from this hospitalization (including imaging, microbiology, ancillary and laboratory) are listed below for reference.      Labs: CBC: Recent Labs  Lab 07/05/17 0917 07/06/17 0523  WBC 20.2* 8.8  NEUTROABS 17.7*  --   HGB 15.7* 12.9  HCT 46.6* 39.6  MCV 88.8 89.8  PLT 257 076   Basic Metabolic Panel: Recent Labs  Lab 07/05/17 0917 07/06/17 0523  NA 136 138  K 3.6 3.6  CL 100* 106  CO2 23 23  GLUCOSE 179* 113*  BUN 26* 23*  CREATININE 0.85 0.67  CALCIUM 8.9 7.8*   Liver Function Tests: Recent Labs  Lab 07/05/17 0917  AST 53*  ALT 42  ALKPHOS 71  BILITOT 0.9  PROT 6.4*  ALBUMIN 3.8   Urinalysis    Component Value Date/Time   COLORURINE YELLOW 07/05/2017 1146   APPEARANCEUR CLEAR 07/05/2017 1146   LABSPEC 1.043 (H) 07/05/2017 1146   PHURINE 5.0 07/05/2017 1146   GLUCOSEU NEGATIVE 07/05/2017 1146   GLUCOSEU NEGATIVE 12/31/2011 1512   HGBUR NEGATIVE 07/05/2017 1146   BILIRUBINUR NEGATIVE 07/05/2017 1146   BILIRUBINUR Neg 10/23/2015 1510   KETONESUR NEGATIVE 07/05/2017 1146   PROTEINUR NEGATIVE 07/05/2017 1146   UROBILINOGEN 0.2 10/23/2015 1510   UROBILINOGEN 0.2 12/31/2011 1512   NITRITE NEGATIVE 07/05/2017 1146   LEUKOCYTESUR TRACE (A) 07/05/2017 1146      Time coordinating discharge: Over 30 minutes  SIGNED:  Vernell Leep, MD, FACP, [facility]. [facility] Pager [phone]  If 7PM-7AM, please contact  night-coverage [email or web address] Password [facility] 07/06/2017, 12:46 PM

## 2017-07-06 NOTE — Care Management Obs Status (Signed)
[place] NOTIFICATION   Patient Details  Name: [name] MRN: [id number] Date of Birth: [birth year]   Medicare Observation Status Notification Given:  Yes    Norina Buzzard, RN 07/06/2017, 2:04 PM

## 2017-07-06 NOTE — Discharge Instructions (Signed)
Please get your medications reviewed and adjusted by your Primary MD.  Please request your Primary MD to go over all Hospital Tests and Procedure/Radiological results at the follow up, please get all Hospital records sent to your Prim MD by signing hospital release before you go home.  If you had Pneumonia of Lung problems at the Hospital: Please get a 2 view Chest X ray done in 6-8 weeks after hospital discharge or sooner if instructed by your Primary MD.  If you have Congestive Heart Failure: Please call your Cardiologist or Primary MD anytime you have any of the following symptoms:  1) 3 pound weight gain in 24 hours or 5 pounds in 1 week  2) shortness of breath, with or without a dry hacking cough  3) swelling in the hands, feet or stomach  4) if you have to sleep on extra pillows at night in order to breathe  Follow cardiac low salt diet and 1.5 lit/day fluid restriction.  If you have diabetes Accuchecks 4 times/day, Once in AM empty stomach and then before each meal. Log in all results and show them to your primary doctor at your next visit. If any glucose reading is under 80 or above 300 call your primary MD immediately.  If you have Seizure/Convulsions/Epilepsy: Please do not drive, operate heavy machinery, participate in activities at heights or participate in high speed sports until you have seen by Primary MD or a Neurologist and advised to do so again.  If you had Gastrointestinal Bleeding: Please ask your Primary MD to check a complete blood count within one week of discharge or at your next visit. Your endoscopic/colonoscopic biopsies that are pending at the time of discharge, will also need to followed by your Primary MD.  Get Medicines reviewed and adjusted. Please take all your medications with you for your next visit with your Primary MD  Please request your Primary MD to go over all hospital tests and procedure/radiological results at the follow up, please ask your  Primary MD to get all Hospital records sent to his/her office.  If you experience worsening of your admission symptoms, develop shortness of breath, life threatening emergency, suicidal or homicidal thoughts you must seek medical attention immediately by calling 911 or calling your MD immediately  if symptoms less severe.  You must read complete instructions/literature along with all the possible adverse reactions/side effects for all the Medicines you take and that have been prescribed to you. Take any new Medicines after you have completely understood and accpet all the possible adverse reactions/side effects.   Do not drive or operate heavy machinery when taking Pain medications.   Do not take more than prescribed Pain, Sleep and Anxiety Medications  Special Instructions: If you have smoked or chewed Tobacco  in the last 2 yrs please stop smoking, stop any regular Alcohol  and or any Recreational drug use.  Wear Seat belts while driving.  Please note You were cared for by a hospitalist during your hospital stay. If you have any questions about your discharge medications or the care you received while you were in the hospital after you are discharged, you can call the unit and asked to speak with the hospitalist on call if the hospitalist that took care of you is not available. Once you are discharged, your primary care physician will handle any further medical issues. Please note that NO REFILLS for any discharge medications will be authorized once you are discharged, as it is imperative that you  return to your primary care physician (or establish a relationship with a primary care physician if you do not have one) for your aftercare needs so that they can reassess your need for medications and monitor your lab values.  You can reach the hospitalist office at phone [phone] or fax [phone]   If you do not have a primary care physician, you can call [phone] for a physician  referral.   Viral Gastroenteritis, Adult Viral gastroenteritis is also known as the stomach flu. This condition is caused by certain germs (viruses). These germs can be passed from person to person very easily (are very contagious). This condition can cause sudden watery poop (diarrhea), fever, and throwing up (vomiting). Having watery poop and throwing up can make you feel weak and cause you to get dehydrated. Dehydration can make you tired and thirsty, make you have a dry mouth, and make it so you pee (urinate) less often. Older adults and people with other diseases or a weak defense system (immune system) are at higher risk for dehydration. It is important to replace the fluids that you lose from having watery poop and throwing up. Follow these instructions at home: Follow instructions from your doctor about how to care for yourself at home. Eating and drinking  Follow these instructions as told by your doctor:  Take an oral rehydration solution (ORS). This is a drink that is sold at pharmacies and stores.  Drink clear fluids in small amounts as you are able, such as: ? Water. ? Ice chips. ? Diluted fruit juice. ? Low-calorie sports drinks.  Eat bland, easy-to-digest foods in small amounts as you are able, such as: ? Bananas. ? Applesauce. ? Rice. ? Low-fat (lean) meats. ? Toast. ? Crackers.  Avoid fluids that have a lot of sugar or caffeine in them.  Avoid alcohol.  Avoid spicy or fatty foods.  General instructions  Drink enough fluid to keep your pee (urine) clear or pale yellow.  Wash your hands often. If you cannot use soap and water, use hand sanitizer.  Make sure that all people in your home wash their hands well and often.  Rest at home while you get better.  Take over-the-counter and prescription medicines only as told by your doctor.  Watch your condition for any changes.  Take a warm bath to help with any burning or pain from having watery poop.  Keep  all follow-up visits as told by your doctor. This is important. Contact a doctor if:  You cannot keep fluids down.  Your symptoms get worse.  You have new symptoms.  You feel light-headed or dizzy.  You have muscle cramps. Get help right away if:  You have chest pain.  You feel very weak or you pass out (faint).  You see blood in your throw-up.  Your throw-up looks like coffee grounds.  You have bloody or black poop (stools) or poop that look like tar.  You have a very bad headache, a stiff neck, or both.  You have a rash.  You have very bad pain, cramping, or bloating in your belly (abdomen).  You have trouble breathing.  You are breathing very quickly.  Your heart is beating very quickly.  Your skin feels cold and clammy.  You feel confused.  You have pain when you pee.  You have signs of dehydration, such as: ? Dark pee, hardly any pee, or no pee. ? Cracked lips. ? Dry mouth. ? Sunken eyes. ? Sleepiness. ? Weakness. This  information is not intended to replace advice given to you by your health care provider. Make sure you discuss any questions you have with your health care provider. Document Released: 10/16/2007 Document Revised: 11/17/2015 Document Reviewed: 01/03/2015 Elsevier Interactive Patient Education  2017 [employer].

## 2017-07-07 ENCOUNTER — Telehealth: Payer: Self-pay

## 2017-07-07 NOTE — Telephone Encounter (Signed)
Transition Care Management Follow-Up Telephone Call   Date discharged and where:[facility] on 07/06/2017  How have you been since you were released from the hospital? Diarrhea yesterday and once his morning. No pain and has been resting  Any patient concerns? None   Items Reviewed:   Meds: Y  Allergies: Y  Dietary Changes Reviewed: Y  Functional Questionnaire:  Independent-I Dependent-D  ADLs:   Dressing- I    Eating- I   Maintaining continence- Some incontinence of bowels   Transferring- I w/ cane   Transportation- D   Meal Prep- I   Managing Meds- I  Confirmed importance and Date/Time of follow-up visits scheduled: Sherrie Mustache, NP 07/16/2017 @ 1:30pm   Confirmed with patient if condition worsens to call PCP or go to the Emergency Dept. Patient was given office number and encouraged to call back with questions or concerns: Yes

## 2017-07-08 ENCOUNTER — Other Ambulatory Visit: Payer: Self-pay

## 2017-07-08 ENCOUNTER — Ambulatory Visit: Payer: PPO

## 2017-07-08 DIAGNOSIS — R935 Abnormal findings on diagnostic imaging of other abdominal regions, including retroperitoneum: Secondary | ICD-10-CM

## 2017-07-08 DIAGNOSIS — K8500 Idiopathic acute pancreatitis without necrosis or infection: Secondary | ICD-10-CM

## 2017-07-08 DIAGNOSIS — R945 Abnormal results of liver function studies: Secondary | ICD-10-CM

## 2017-07-08 DIAGNOSIS — R7989 Other specified abnormal findings of blood chemistry: Secondary | ICD-10-CM

## 2017-07-08 DIAGNOSIS — K760 Fatty (change of) liver, not elsewhere classified: Secondary | ICD-10-CM

## 2017-07-11 ENCOUNTER — Telehealth: Payer: Self-pay | Admitting: Gastroenterology

## 2017-07-11 ENCOUNTER — Other Ambulatory Visit: Payer: Self-pay

## 2017-07-11 ENCOUNTER — Encounter (HOSPITAL_COMMUNITY): Payer: Self-pay

## 2017-07-11 ENCOUNTER — Ambulatory Visit (HOSPITAL_COMMUNITY)
Admission: RE | Admit: 2017-07-11 | Discharge: 2017-07-11 | Disposition: A | Discharge status: Home / Self Care | Payer: PPO | Source: Ambulatory Visit | Attending: Gastroenterology | Admitting: Gastroenterology

## 2017-07-11 ENCOUNTER — Other Ambulatory Visit (INDEPENDENT_AMBULATORY_CARE_PROVIDER_SITE_OTHER): Payer: PPO

## 2017-07-11 DIAGNOSIS — K8500 Idiopathic acute pancreatitis without necrosis or infection: Secondary | ICD-10-CM | POA: Diagnosis not present

## 2017-07-11 DIAGNOSIS — R7989 Other specified abnormal findings of blood chemistry: Secondary | ICD-10-CM

## 2017-07-11 DIAGNOSIS — R935 Abnormal findings on diagnostic imaging of other abdominal regions, including retroperitoneum: Secondary | ICD-10-CM | POA: Insufficient documentation

## 2017-07-11 DIAGNOSIS — I700 Atherosclerosis of aorta: Secondary | ICD-10-CM | POA: Diagnosis not present

## 2017-07-11 DIAGNOSIS — R945 Abnormal results of liver function studies: Secondary | ICD-10-CM

## 2017-07-11 DIAGNOSIS — K760 Fatty (change of) liver, not elsewhere classified: Secondary | ICD-10-CM

## 2017-07-11 DIAGNOSIS — R197 Diarrhea, unspecified: Secondary | ICD-10-CM

## 2017-07-11 LAB — FERRITIN: Ferritin: 196.4 ng/mL (ref 10.0–291.0)

## 2017-07-11 LAB — PROTIME-INR
INR: 1 ratio (ref 0.8–1.0)
Prothrombin Time: 10.8 s (ref 9.6–13.1)

## 2017-07-11 LAB — IRON: Iron: 69 ug/dL (ref 42–145)

## 2017-07-11 LAB — IBC PANEL
Iron: 69 ug/dL (ref 42–145)
SATURATION RATIOS: 19.8 % — AB (ref 20.0–50.0)
TRANSFERRIN: 249 mg/dL (ref 212.0–360.0)

## 2017-07-11 MED ORDER — IOPAMIDOL (ISOVUE-300) INJECTION 61%
75.0000 mL | Freq: Once | INTRAVENOUS | Status: AC | PRN
  Administered 2017-07-11: 75 mL via INTRAVENOUS

## 2017-07-11 MED ORDER — ONDANSETRON HCL 4 MG PO TABS: 4.0000 mg | ORAL_TABLET | Freq: Four times a day (QID) | ORAL | 0 refills | Status: DC | PRN

## 2017-07-11 MED ORDER — IOPAMIDOL (ISOVUE-300) INJECTION 61%
INTRAVENOUS | Status: AC
  Filled 2017-07-11: qty 75

## 2017-07-11 NOTE — Addendum Note (Signed)
Addended by: Doristine Counter on: 07/11/2017 03:42 PM   Modules accepted: Orders

## 2017-07-11 NOTE — Addendum Note (Signed)
Addended by: Sallye Ober on: 07/11/2017 03:43 PM   Modules accepted: Orders

## 2017-07-11 NOTE — Addendum Note (Signed)
Addended by: Sallye Ober on: 07/11/2017 03:44 PM   Modules accepted: Orders

## 2017-07-11 NOTE — Addendum Note (Signed)
Addended by: Doristine Counter on: 07/11/2017 03:43 PM   Modules accepted: Orders

## 2017-07-11 NOTE — Telephone Encounter (Signed)
Patient advised and Rx sent in for zofran.

## 2017-07-11 NOTE — Addendum Note (Signed)
Addended by: Sallye Ober on: 07/11/2017 03:35 PM   Modules accepted: Orders

## 2017-07-11 NOTE — Telephone Encounter (Signed)
Sorry to hear she is not feeling well. When she was admitted they had thought she had acute gastroenteritis. If her diarrhea is persisting we can obtain a GI pathogen panel. Otherwise, if she thinks the fluconazole is making her feel bad, she has completed nearly 2 weeks, I think okay to stop it at this point. Can you give her some zofran for nausea - 39m ODT every 6 hrs PRN. If GI pathogen panel is negative, can then use immodium. I will await CT chest. Of note, she also has labs we have ordered she has not yet had drawn, maybe she can do this when she goes to submit stool sample. Thanks

## 2017-07-11 NOTE — Telephone Encounter (Signed)
Patient was in hospital last weekend, has continued to have nausea/diarrhea. Patient states that these symptoms started when she started the fluconazole on 2/19. She will finish her prescription on Monday 3/4. She has not taken anything for the diarrhea, and does not have a script for nausea medication. She is able to keep liquids down and crackers. Please advise.

## 2017-07-14 ENCOUNTER — Other Ambulatory Visit: Payer: PPO

## 2017-07-14 DIAGNOSIS — R197 Diarrhea, unspecified: Secondary | ICD-10-CM | POA: Diagnosis not present

## 2017-07-14 LAB — HEPATITIS B SURFACE ANTIGEN: Hepatitis B Surface Ag: NEGATIVE

## 2017-07-14 LAB — IGG: IgG (Immunoglobin G), Serum: 797 mg/dL (ref 700–1600)

## 2017-07-14 LAB — HEPATITIS C ANTIBODY

## 2017-07-14 LAB — HEPATITIS B SURFACE ANTIBODY,QUALITATIVE: Hep B Surface Ab, Qual: REACTIVE

## 2017-07-14 LAB — HEPATITIS A ANTIBODY, TOTAL: HEP A TOTAL AB: NEGATIVE

## 2017-07-14 LAB — ANTI-SMOOTH MUSCLE ANTIBODY, IGG: Smooth Muscle Ab: 15 Units (ref 0–19)

## 2017-07-16 ENCOUNTER — Ambulatory Visit: Payer: Self-pay | Admitting: Nurse Practitioner

## 2017-07-16 ENCOUNTER — Encounter: Payer: Self-pay | Admitting: Nurse Practitioner

## 2017-07-16 ENCOUNTER — Ambulatory Visit (INDEPENDENT_AMBULATORY_CARE_PROVIDER_SITE_OTHER): Payer: PPO

## 2017-07-16 ENCOUNTER — Other Ambulatory Visit: Payer: Self-pay

## 2017-07-16 ENCOUNTER — Ambulatory Visit (INDEPENDENT_AMBULATORY_CARE_PROVIDER_SITE_OTHER): Payer: PPO | Admitting: Nurse Practitioner

## 2017-07-16 ENCOUNTER — Ambulatory Visit: Payer: PPO | Admitting: Gastroenterology

## 2017-07-16 VITALS — BP 142/70 | HR 86 | Temp 97.5°F | Ht 63.0 in | Wt 187.0 lb

## 2017-07-16 VITALS — BP 142/70 | HR 86 | Temp 97.5°F | Resp 18 | Ht 63.0 in | Wt 187.0 lb

## 2017-07-16 DIAGNOSIS — K529 Noninfective gastroenteritis and colitis, unspecified: Secondary | ICD-10-CM

## 2017-07-16 DIAGNOSIS — R739 Hyperglycemia, unspecified: Secondary | ICD-10-CM | POA: Diagnosis not present

## 2017-07-16 DIAGNOSIS — I10 Essential (primary) hypertension: Secondary | ICD-10-CM | POA: Diagnosis not present

## 2017-07-16 DIAGNOSIS — E782 Mixed hyperlipidemia: Secondary | ICD-10-CM | POA: Diagnosis not present

## 2017-07-16 DIAGNOSIS — I8500 Esophageal varices without bleeding: Secondary | ICD-10-CM

## 2017-07-16 DIAGNOSIS — J4520 Mild intermittent asthma, uncomplicated: Secondary | ICD-10-CM | POA: Diagnosis not present

## 2017-07-16 DIAGNOSIS — K5900 Constipation, unspecified: Secondary | ICD-10-CM | POA: Diagnosis not present

## 2017-07-16 DIAGNOSIS — B3781 Candidal esophagitis: Secondary | ICD-10-CM

## 2017-07-16 DIAGNOSIS — Z0000 Encounter for general adult medical examination without abnormal findings: Secondary | ICD-10-CM

## 2017-07-16 LAB — LIPID PANEL
CHOL/HDL RATIO: 4.4 (calc) (ref <5.0)
CHOLESTEROL: 206 mg/dL — AB (ref <200)
HDL: 47 mg/dL — ABNORMAL LOW (ref >50)
LDL Cholesterol (Calc): 127 mg/dL (calc) — ABNORMAL HIGH
Non-HDL Cholesterol (Calc): 159 mg/dL (calc) — ABNORMAL HIGH (ref <130)
Triglycerides: 201 mg/dL — ABNORMAL HIGH (ref <150)

## 2017-07-16 MED ORDER — PROPRANOLOL HCL 10 MG PO TABS: 10.0000 mg | ORAL_TABLET | Freq: Two times a day (BID) | ORAL | 1 refills | Status: DC

## 2017-07-16 MED ORDER — ZOSTER VAC RECOMB ADJUVANTED 50 MCG/0.5ML IM SUSR: 0.5000 mL | Freq: Once | INTRAMUSCULAR | 1 refills | Status: AC

## 2017-07-16 NOTE — Progress Notes (Signed)
Subjective:   [name] is a 77 y.o. female who presents for Medicare Annual (Subsequent) preventive examination.  Last AWV-07/02/2016    Objective:     Vitals: BP (!) 142/70 (BP Location: Left Arm, Patient Position: Sitting)   Pulse 86   Temp (!) 97.5 F (36.4 C) (Oral)   Ht 5' 3"  (1.6 m)   Wt 187 lb (84.8 kg)   SpO2 93%   BMI 33.13 kg/m   Body mass index is 33.13 kg/m.  Advanced Directives 07/16/2017 07/05/2017 06/25/2017 03/27/2017 02/28/2017 01/02/2017 09/24/2016  Does Patient Have a Medical Advance Directive? No No No No No No No  Would patient like information on creating a medical advance directive? Yes (MAU/Ambulatory/Procedural Areas - Information given) - No - Patient declined No - Patient declined - - Yes (MAU/Ambulatory/Procedural Areas - Information given)    Tobacco Social History   Tobacco Use  Smoking Status Former Smoker  . Packs/day: 2.00  . Years: 8.00  . Pack years: 16.00  . Types: Cigarettes  . Last attempt to quit: 05/13/1962  . Years since quitting: 55.2  Smokeless Tobacco Never Used     Counseling given: Not Answered   Clinical Intake:  Pre-visit preparation completed: No  Pain : No/denies pain     Nutritional Risks: None Diabetes: No  How often do you need to have someone help you when you read instructions, pamphlets, or other written materials from your doctor or pharmacy?: 1 - Never What is the last grade level you completed in school?: HS  Interpreter Needed?: No  Information entered by :: Tyson Dense, RN  Past Medical History:  Diagnosis Date  . ALLERGIC RHINITIS   . Anxiety   . Asthma   . Blood type O+   . Cervical strain, acute   . Colon polyp 2010   adenoma  . Diverticulosis of colon   . Dizziness   . Fibromyalgia   . GERD (gastroesophageal reflux disease)   . History of nephrolithiasis   . Hypercholesterolemia    borderline  . Hypertension   . IBS (irritable bowel syndrome)   . Osteoarthritis   .  Personal history of allergy to unspecified medicinal agent   . Postconcussion syndrome   . Vitamin D deficiency    Past Surgical History:  Procedure Laterality Date  . CHOLECYSTECTOMY, LAPAROSCOPIC  2004   for gallstone pancreatitis  . KNEE SURGERY Right   . KNEE SURGERY Left   . THYROID SURGERY     Family History  Problem Relation Age of Onset  . Breast cancer Mother   . Alzheimer's disease Mother   . Heart disease Mother   . COPD Brother   . Heart disease Sister   . Breast cancer Sister   . Alcohol abuse Sister   . Ovarian cancer Paternal Grandmother   . Arthritis Other        Cousins   . COPD Cousin        Maternal side   . Heart attack Maternal Uncle    Social History   Socioeconomic History  . Marital status: Divorced    Spouse name: None  . Number of children: 1  . Years of education: None  . Highest education level: None  Social Needs  . Financial resource strain: Not hard at all  . Food insecurity - worry: Never true  . Food insecurity - inability: Never true  . Transportation needs - medical: No  . Transportation needs - non-medical: No  Occupational  History  . Occupation: retired    Fish farm manager: [employer]    Comment: Presenter, broadcasting  Tobacco Use  . Smoking status: Former Smoker    Packs/day: 2.00    Years: 8.00    Pack years: 16.00    Types: Cigarettes    Last attempt to quit: 05/13/1962    Years since quitting: 55.2  . Smokeless tobacco: Never Used  Substance and Sexual Activity  . Alcohol use: No  . Drug use: No  . Sexual activity: Not Currently    Birth control/protection: Post-menopausal  Other Topics Concern  . None  Social History Narrative   exercises 3x a week   drinks 1 cup caffeine every other day    Outpatient Encounter Medications as of 07/16/2017  Medication Sig  . ADVAIR HFA 115-21 MCG/ACT inhaler Inhale 2 puffs into the lungs 2 (two) times daily.   Marland Kitchen albuterol (PROVENTIL HFA;VENTOLIN HFA) 108 (90  Base) MCG/ACT inhaler Inhale 2 puffs every 4 (four) hours as needed into the lungs for wheezing or shortness of breath. RESCUE  . amLODipine (NORVASC) 5 MG tablet TAKE 1 TABLET BY MOUTH EVERY DAY FOR BLOOD PRESSURE  . aspirin 81 MG EC tablet Take 81 mg by mouth daily.    . Cetirizine HCl (ZYRTEC ALLERGY) 10 MG CAPS Take 10 mg by mouth daily.   . Cholecalciferol (VITAMIN D3) 1000 UNITS CAPS Take 1,000 Units by mouth daily.   Marland Kitchen esomeprazole (NEXIUM) 40 MG capsule Take 1 capsule (40 mg total) by mouth daily.  . fluticasone (FLONASE) 50 MCG/ACT nasal spray SHAKE LIQUID AND USE ONE TO TWO SPRAYS IN EACH NOSTRIL TWICE DAILY  . hydrochlorothiazide (MICROZIDE) 12.5 MG capsule TAKE 1 CAPSULE BY MOUTH EVERY DAY (Patient taking differently: Take 12.5 mg by mouth daily. TAKE 1 CAPSULE BY MOUTH EVERY DAY)  . ibuprofen (ADVIL,MOTRIN) 100 MG tablet Take 100 mg by mouth every 6 (six) hours as needed for fever.  . meclizine (ANTIVERT) 25 MG tablet TAKE 1 TABLET 3 TIMES A DAY AS NEEDED FOR DIZZINESS/NAUSEA  . Multiple Vitamins-Minerals (CENTRUM SILVER PO) Take 1 tablet by mouth daily.   . Omega-3 Fatty Acids (FISH OIL MAXIMUM STRENGTH) 1200 MG CAPS Take 1,200 mg by mouth 2 (two) times daily.   . ondansetron (ZOFRAN) 4 MG tablet Take 1 tablet (4 mg total) by mouth every 6 (six) hours as needed for nausea or vomiting.  . potassium chloride SA (KLOR-CON M20) 20 MEQ tablet Take 1 tablet (20 mEq total) by mouth daily.  . predniSONE (DELTASONE) 20 MG tablet TAKE AS DIRECTED (Patient taking differently: TAKE AS DIRECTED. taking 10 MG (0.5 TAB) EVERY OTHER DAY)  . Propylene Glycol (SYSTANE BALANCE) 0.6 % SOLN Place 1 drop into both ears 2 (two) times daily.   Marland Kitchen SALINE NASAL SPRAY NA Place 1 spray into both nostrils 2 (two) times daily.   Marland Kitchen Zoster Vaccine Adjuvanted [facility]) injection Inject 0.5 mLs into the muscle once for 1 dose.  . [DISCONTINUED] Zoster Vaccine Adjuvanted [facility]) injection Inject 0.5 mLs into the  muscle once.  . methylcellulose (CITRUCEL) oral powder Take once a day (Patient not taking: Reported on 07/16/2017)  . [DISCONTINUED] acetaminophen (TYLENOL) 500 MG tablet Take 500 mg by mouth every 6 (six) hours as needed (for headache as needed).   . [DISCONTINUED] fluconazole (DIFLUCAN) 100 MG tablet Take 2 tablets by mouth on day one, then take 1 tablet by mouth every day thereafter until gone (14 days total) (Patient taking differently:  Take 100-200 mg by mouth See admin instructions. Take 2 tablets by mouth on day one, then take 1 tablet by mouth every day thereafter until gone (14 days total))   No facility-administered encounter medications on file as of 07/16/2017.     Activities of Daily Living In your present state of health, do you have any difficulty performing the following activities: 07/16/2017  Hearing? N  Vision? N  Difficulty concentrating or making decisions? Y  Walking or climbing stairs? Y  Dressing or bathing? N  Doing errands, shopping? N  Preparing Food and eating ? N  Using the Toilet? N  In the past six months, have you accidently leaked urine? Y  Do you have problems with loss of bowel control? N  Managing your Medications? N  Managing your Finances? N  Housekeeping or managing your Housekeeping? N  Some recent data might be hidden    Patient Care Team: Lauree Chandler, NP as PCP - General (Nurse Practitioner)    Assessment:   This is a routine wellness examination for Ahni.  Exercise Activities and Dietary recommendations Current Exercise Habits: The patient does not participate in regular exercise at present, Exercise limited by: None identified  Goals    . LDL CALC < 130    . Weight (lb) < 140 lb (63.5 kg)     Starting 07/02/16, I will attempt to decrease my weight to reach my goal weight of 140 lbs.        Fall Risk Fall Risk  07/16/2017 03/27/2017 02/28/2017 01/02/2017 09/24/2016  Falls in the past year? No No No No No  Number falls in past yr:  - - - - -  Injury with Fall? - - - - -  Follow up - - - - -   Is the patient's home free of loose throw rugs in walkways, pet beds, electrical cords, etc?   yes      Grab bars in the bathroom? yes      Handrails on the stairs?   yes      Adequate lighting?   yes  Depression Screen PHQ 2/9 Scores 07/16/2017 07/02/2016 11/07/2015 10/23/2015  PHQ - 2 Score 0 0 0 0     Cognitive Function MMSE - Mini Mental State Exam 07/16/2017 07/02/2016 04/04/2015 09/22/2013  Not completed: - - (No Data) -  Orientation to time 5 5 5 5   Orientation to Place 5 4 5 5   Registration 3 3 3 3   Attention/ Calculation 5 5 5 5   Recall 2 3 2 2   Language- name 2 objects 2 2 2 2   Language- repeat 1 1 1 1   Language- follow 3 step command 3 3 3 3   Language- read & follow direction 1 1 1 1   Write a sentence 1 1 1 1   Copy design 1 1 1 1   Total score 29 29 29 29         Immunization History  Administered Date(s) Administered  . Influenza Split 02/11/2011, 02/25/2012  . Influenza Whole 02/10/2009, 02/26/2010  . Influenza,inj,Quad PF,6+ Mos 02/09/2014, 03/20/2015, 02/27/2016  . Influenza-Unspecified 03/13/2013, 03/13/2017  . Pneumococcal Conjugate-13 02/27/2016  . Pneumococcal Polysaccharide-23 08/25/2014  . Tdap 12/06/2013    Qualifies for Shingles Vaccine? Yes, educated prescription sent to pharmacy  Screening Tests Health Maintenance  Topic Date Due  . COLONOSCOPY  06/25/2020  . TETANUS/TDAP  12/07/2023  . INFLUENZA VACCINE  Completed  . DEXA SCAN  Completed  . PNA vac Low Risk Adult  Completed    Cancer Screenings: Lung: Low Dose CT Chest recommended if Age 103-80 years, 30 pack-year currently smoking OR have quit w/in 15years. Patient does not qualify. Breast:  Up to date on Mammogram? Yes   Up to date of Bone Density/Dexa? Yes Colorectal: up to date  Additional Screenings:  Hepatitis C Screening: declined     Plan:    I have personally reviewed and addressed the Medicare Annual Wellness  questionnaire and have noted the following in the patient's chart:  A. Medical and social history B. Use of alcohol, tobacco or illicit drugs  C. Current medications and supplements D. Functional ability and status E.  Nutritional status F.  Physical activity G. Advance directives H. List of other physicians I.  Hospitalizations, surgeries, and ER visits in previous 12 months J.  [place] to include hearing, vision, cognitive, depression L. Referrals and appointments - none  In addition, I have reviewed and discussed with patient certain preventive protocols, quality metrics, and best practice recommendations. A written personalized care plan for preventive services as well as general preventive health recommendations were provided to patient.  See attached scanned questionnaire for additional information.   Signed,   Tyson Dense, RN Nurse Health Advisor  Patient Concerns: Cramping in feet, hands, and legs

## 2017-07-16 NOTE — Patient Instructions (Addendum)
To use colace 100 mg by mouth 1-2 times daily or  Miralax 17 gm daily as needed for constipation   To start propranolol 10 mg twice daily for blood pressure and STOP norvasc

## 2017-07-16 NOTE — Patient Instructions (Signed)
[name] , Thank you for taking time to come for your Medicare Wellness Visit. I appreciate your ongoing commitment to your health goals. Please review the following plan we discussed and let me know if I can assist you in the future.   Screening recommendations/referrals: Colonoscopy excluded, you are over age 79 Mammogram excluded, you are over age 8 Bone Density up to date Recommended yearly ophthalmology/optometry visit for glaucoma screening and checkup Recommended yearly dental visit for hygiene and checkup  Vaccinations: Influenza vaccine up to date, due 2019 fall season Pneumococcal vaccine up to date Tdap vaccine up to date, due 12/07/2023 Shingles vaccine due, prescription sent to pharmacy    Advanced directives: Advance directive discussed with you today. I have provided a copy for you to complete at home and have notarized. Once this is complete please bring a copy in to our office so we can scan it into your chart.  Conditions/risks identified: none  Next appointment: Tyson Dense, RN 07/22/2018 @ 10am   Preventive Care 65 Years and Older, Female Preventive care refers to lifestyle choices and visits with your health care provider that can promote health and wellness. What does preventive care include?  A yearly physical exam. This is also called an annual well check.  Dental exams once or twice a year.  Routine eye exams. Ask your health care provider how often you should have your eyes checked.  Personal lifestyle choices, including:  Daily care of your teeth and gums.  Regular physical activity.  Eating a healthy diet.  Avoiding tobacco and drug use.  Limiting alcohol use.  Practicing safe sex.  Taking low-dose aspirin every day.  Taking vitamin and mineral supplements as recommended by your health care provider. What happens during an annual well check? The services and screenings done by your health care provider during your annual well check  will depend on your age, overall health, lifestyle risk factors, and family history of disease. Counseling  Your health care provider may ask you questions about your:  Alcohol use.  Tobacco use.  Drug use.  Emotional well-being.  Home and relationship well-being.  Sexual activity.  Eating habits.  History of falls.  Memory and ability to understand (cognition).  Work and work Statistician.  Reproductive health. Screening  You may have the following tests or measurements:  Height, weight, and BMI.  Blood pressure.  Lipid and cholesterol levels. These may be checked every 5 years, or more frequently if you are over 49 years old.  Skin check.  Lung cancer screening. You may have this screening every year starting at age 57 if you have a 30-pack-year history of smoking and currently smoke or have quit within the past 15 years.  Fecal occult blood test (FOBT) of the stool. You may have this test every year starting at age 55.  Flexible sigmoidoscopy or colonoscopy. You may have a sigmoidoscopy every 5 years or a colonoscopy every 10 years starting at age 72.  Hepatitis C blood test.  Hepatitis B blood test.  Sexually transmitted disease (STD) testing.  Diabetes screening. This is done by checking your blood sugar (glucose) after you have not eaten for a while (fasting). You may have this done every 1-3 years.  Bone density scan. This is done to screen for osteoporosis. You may have this done starting at age 38.  Mammogram. This may be done every 1-2 years. Talk to your health care provider about how often you should have regular mammograms. Talk with your  health care provider about your test results, treatment options, and if necessary, the need for more tests. Vaccines  Your health care provider may recommend certain vaccines, such as:  Influenza vaccine. This is recommended every year.  Tetanus, diphtheria, and acellular pertussis (Tdap, Td) vaccine. You may  need a Td booster every 10 years.  Zoster vaccine. You may need this after age 45.  Pneumococcal 13-valent conjugate (PCV13) vaccine. One dose is recommended after age 18.  Pneumococcal polysaccharide (PPSV23) vaccine. One dose is recommended after age 70. Talk to your health care provider about which screenings and vaccines you need and how often you need them. This information is not intended to replace advice given to you by your health care provider. Make sure you discuss any questions you have with your health care provider. Document Released: 05/26/2015 Document Revised: 01/17/2016 Document Reviewed: 02/28/2015 Elsevier Interactive Patient Education  2017 [place] Prevention in the Home Falls can cause injuries. They can happen to people of all ages. There are many things you can do to make your home safe and to help prevent falls. What can I do on the outside of my home?  Regularly fix the edges of walkways and driveways and fix any cracks.  Remove anything that might make you trip as you walk through a door, such as a raised step or threshold.  Trim any bushes or trees on the path to your home.  Use bright outdoor lighting.  Clear any walking paths of anything that might make someone trip, such as rocks or tools.  Regularly check to see if handrails are loose or broken. Make sure that both sides of any steps have handrails.  Any raised decks and porches should have guardrails on the edges.  Have any leaves, snow, or ice cleared regularly.  Use sand or salt on walking paths during winter.  Clean up any spills in your garage right away. This includes oil or grease spills. What can I do in the bathroom?  Use night lights.  Install grab bars by the toilet and in the tub and shower. Do not use towel bars as grab bars.  Use non-skid mats or decals in the tub or shower.  If you need to sit down in the shower, use a plastic, non-slip stool.  Keep the floor  dry. Clean up any water that spills on the floor as soon as it happens.  Remove soap buildup in the tub or shower regularly.  Attach bath mats securely with double-sided non-slip rug tape.  Do not have throw rugs and other things on the floor that can make you trip. What can I do in the bedroom?  Use night lights.  Make sure that you have a light by your bed that is easy to reach.  Do not use any sheets or blankets that are too big for your bed. They should not hang down onto the floor.  Have a firm chair that has side arms. You can use this for support while you get dressed.  Do not have throw rugs and other things on the floor that can make you trip. What can I do in the kitchen?  Clean up any spills right away.  Avoid walking on wet floors.  Keep items that you use a lot in easy-to-reach places.  If you need to reach something above you, use a strong step stool that has a grab bar.  Keep electrical cords out of the way.  Do not use  floor polish or wax that makes floors slippery. If you must use wax, use non-skid floor wax.  Do not have throw rugs and other things on the floor that can make you trip. What can I do with my stairs?  Do not leave any items on the stairs.  Make sure that there are handrails on both sides of the stairs and use them. Fix handrails that are broken or loose. Make sure that handrails are as long as the stairways.  Check any carpeting to make sure that it is firmly attached to the stairs. Fix any carpet that is loose or worn.  Avoid having throw rugs at the top or bottom of the stairs. If you do have throw rugs, attach them to the floor with carpet tape.  Make sure that you have a light switch at the top of the stairs and the bottom of the stairs. If you do not have them, ask someone to add them for you. What else can I do to help prevent falls?  Wear shoes that:  Do not have high heels.  Have rubber bottoms.  Are comfortable and fit you  well.  Are closed at the toe. Do not wear sandals.  If you use a stepladder:  Make sure that it is fully opened. Do not climb a closed stepladder.  Make sure that both sides of the stepladder are locked into place.  Ask someone to hold it for you, if possible.  Clearly mark and make sure that you can see:  Any grab bars or handrails.  First and last steps.  Where the edge of each step is.  Use tools that help you move around (mobility aids) if they are needed. These include:  Canes.  Walkers.  Scooters.  Crutches.  Turn on the lights when you go into a dark area. Replace any light bulbs as soon as they burn out.  Set up your furniture so you have a clear path. Avoid moving your furniture around.  If any of your floors are uneven, fix them.  If there are any pets around you, be aware of where they are.  Review your medicines with your doctor. Some medicines can make you feel dizzy. This can increase your chance of falling. Ask your doctor what other things that you can do to help prevent falls. This information is not intended to replace advice given to you by your health care provider. Make sure you discuss any questions you have with your health care provider. Document Released: 02/23/2009 Document Revised: 10/05/2015 Document Reviewed: 06/03/2014 Elsevier Interactive Patient Education  2017 [employer].

## 2017-07-16 NOTE — Progress Notes (Signed)
Careteam: Patient Care Team: Lauree Chandler, NP as PCP - General (Nurse Practitioner)  Advanced Directive information    Allergies  Allergen Reactions  . Ace Inhibitors   . Amoxicillin Itching    REACTION: unknown? pain in right kidney  . Benicar Hct [Olmesartan Medoxomil-Hctz]     Extreme weakness  . Budesonide-Formoterol Fumarate     Causes tremors and numbness  . Chlorzoxazone [Chlorzoxazone]   . Citalopram Hydrobromide     REACTION: hives  . Citalopram Hydrobromide   . Clonidine Derivatives   . Droperidol     REACTION: hives  . Flexeril [Cyclobenzaprine Hcl]   . Fluconazole Nausea And Vomiting and Other (See Comments)    fatigue  . Ketorolac Tromethamine     REACTION: hives  . Ketorolac Tromethamine   . Metoclopramide Hcl   . Mometasone Furo-Formoterol Fum     Causes sore throat, blurred vision and unable to sleep--started on med 09-17-10  . Morphine     REACTION: hives and itching  . Olmesartan Medoxomil     REACTION: fatique  . Breo Ellipta [Fluticasone Furoate-Vilanterol] Itching    Pt reports itching with [place] is related to being lactose intolerant.     Chief Complaint  Patient presents with  . [facility] f/u- 07/08/17-07/06/17    HPI: Patient is a 77 y.o. female seen in the office today for a hospital follow up, pt was in the hospital from 07/05/17 to 07/06/17 due to N/V/diarrhea thought to be acute viral gastroenteritis versus food poisoning.  States she has been feeling well, except for periods of nausea.  She was started on Zofran for nausea on 07/11/17 by her gastroenterologist due to complaints of nausea in the mornings.  She has been taking the Zofran every six hours and states this relieves her nausea.  Denies complaints of vomiting, fever, chills, heartburn or chest pain.    Pt has been on a bland diet for about 2 1/2 weeks.  Started back on a regular diet about three days ago, but now feels constipated, no BM for 3 days now.  Voiced no other concerns today.    06/25/17 EGD showed suspected esophageal varices: pt gastroenterologist is requesting to start pt on propranolol 10 mg by mouth BID  ( and to stop taking the Norvasc 5 mg by mouth daily).     She was also noted to have candida esophagitis and placed on diflucan, took several days of medication then was told to stop due to nausea her GERD symptoms have completed resolved.   Pt have an upcoming Gastroenterologist appointment on 08/02/17.   Pt is fasting today.  Overall asthma/sinus congestion/allergies are much better as well.  Review of Systems:  Review of Systems  Constitutional: Negative for chills, fever and malaise/fatigue.  HENT: Positive for hearing loss. Negative for ear pain.        Baseline finding  Respiratory: Negative.   Cardiovascular: Negative for chest pain, palpitations and leg swelling.  Gastrointestinal: Positive for constipation and nausea (improved on zofran). Negative for abdominal pain, blood in stool, diarrhea, heartburn, melena and vomiting.  Genitourinary: Negative for dysuria, flank pain, frequency, hematuria and urgency.  Musculoskeletal: Negative for falls.  Skin: Negative.   Psychiatric/Behavioral: Negative.     Past Medical History:  Diagnosis Date  . ALLERGIC RHINITIS   . Anxiety   . Asthma   . Blood type O+   . Cervical strain, acute   . Colon polyp 2010  adenoma  . Diverticulosis of colon   . Dizziness   . Fibromyalgia   . GERD (gastroesophageal reflux disease)   . History of nephrolithiasis   . Hypercholesterolemia    borderline  . Hypertension   . IBS (irritable bowel syndrome)   . Osteoarthritis   . Personal history of allergy to unspecified medicinal agent   . Postconcussion syndrome   . Vitamin D deficiency    Past Surgical History:  Procedure Laterality Date  . CHOLECYSTECTOMY, LAPAROSCOPIC  2004   for gallstone pancreatitis  . KNEE SURGERY Right   . KNEE SURGERY Left   . THYROID SURGERY      Social History:   reports that she quit smoking about 55 years ago. Her smoking use included cigarettes. She has a 16.00 pack-year smoking history. she has never used smokeless tobacco. She reports that she does not drink alcohol or use drugs.  Family History  Problem Relation Age of Onset  . Breast cancer Mother   . Alzheimer's disease Mother   . Heart disease Mother   . COPD Brother   . Heart disease Sister   . Breast cancer Sister   . Alcohol abuse Sister   . Ovarian cancer Paternal Grandmother   . Arthritis Other        Cousins   . COPD Cousin        Maternal side   . Heart attack Maternal Uncle     Medications: Patient's Medications  New Prescriptions   No medications on file  Previous Medications   ADVAIR HFA 115-21 MCG/ACT INHALER    Inhale 2 puffs into the lungs 2 (two) times daily.    ALBUTEROL (PROVENTIL HFA;VENTOLIN HFA) 108 (90 BASE) MCG/ACT INHALER    Inhale 2 puffs every 4 (four) hours as needed into the lungs for wheezing or shortness of breath. RESCUE   AMLODIPINE (NORVASC) 5 MG TABLET    TAKE 1 TABLET BY MOUTH EVERY DAY FOR BLOOD PRESSURE   ASPIRIN 81 MG EC TABLET    Take 81 mg by mouth daily.     CETIRIZINE HCL (ZYRTEC ALLERGY) 10 MG CAPS    Take 10 mg by mouth daily.    CHOLECALCIFEROL (VITAMIN D3) 1000 UNITS CAPS    Take 1,000 Units by mouth daily.    ESOMEPRAZOLE (NEXIUM) 40 MG CAPSULE    Take 1 capsule (40 mg total) by mouth daily.   FLUTICASONE (FLONASE) 50 MCG/ACT NASAL SPRAY    SHAKE LIQUID AND USE ONE TO TWO SPRAYS IN EACH NOSTRIL TWICE DAILY   HYDROCHLOROTHIAZIDE (MICROZIDE) 12.5 MG CAPSULE    TAKE 1 CAPSULE BY MOUTH EVERY DAY   IBUPROFEN (ADVIL,MOTRIN) 100 MG TABLET    Take 100 mg by mouth every 6 (six) hours as needed for fever.   MECLIZINE (ANTIVERT) 25 MG TABLET    TAKE 1 TABLET 3 TIMES A DAY AS NEEDED FOR DIZZINESS/NAUSEA   METHYLCELLULOSE (CITRUCEL) ORAL POWDER    Take once a day   MULTIPLE VITAMINS-MINERALS (CENTRUM SILVER PO)    Take 1  tablet by mouth daily.    OMEGA-3 FATTY ACIDS (FISH OIL MAXIMUM STRENGTH) 1200 MG CAPS    Take 1,200 mg by mouth 2 (two) times daily.    ONDANSETRON (ZOFRAN) 4 MG TABLET    Take 1 tablet (4 mg total) by mouth every 6 (six) hours as needed for nausea or vomiting.   POTASSIUM CHLORIDE SA (KLOR-CON M20) 20 MEQ TABLET    Take 1 tablet (20 mEq total)  by mouth daily.   PREDNISONE (DELTASONE) 20 MG TABLET    TAKE AS DIRECTED   PROPYLENE GLYCOL (SYSTANE BALANCE) 0.6 % SOLN    Place 1 drop into both ears 2 (two) times daily.    SALINE NASAL SPRAY NA    Place 1 spray into both nostrils 2 (two) times daily.    ZOSTER VACCINE ADJUVANTED (SHINGRIX) INJECTION    Inject 0.5 mLs into the muscle once for 1 dose.  Modified Medications   No medications on file  Discontinued Medications   No medications on file     Physical Exam:  Vitals:   07/16/17 1316  BP: (!) 142/70  Pulse: 86  Resp: 18  Temp: (!) 97.5 F (36.4 C)  SpO2: 93%  Weight: 187 lb (84.8 kg)  Height: 5' 3"  (1.6 m)   Body mass index is 33.13 kg/m.  Physical Exam  Constitutional: She is oriented to person, place, and time. She appears well-developed and well-nourished.  HENT:  Head: Normocephalic and atraumatic.  Eyes: Pupils are equal, round, and reactive to light.  Neck: Normal range of motion. Neck supple.  Cardiovascular: Normal rate, regular rhythm and normal heart sounds.  Pulmonary/Chest: Effort normal and breath sounds normal.  Abdominal: Soft. Bowel sounds are normal. She exhibits no distension. There is no tenderness. There is no guarding.  Obese abdomen  Musculoskeletal: She exhibits no edema.  Neurological: She is alert and oriented to person, place, and time.  Skin: Skin is warm and dry. Capillary refill takes less than 2 seconds.    Labs reviewed: Basic Metabolic Panel: Recent Labs    03/17/17 0722 07/05/17 0917 07/06/17 0523  NA 139 136 138  K 4.4 3.6 3.6  CL 104 100* 106  CO2 26 23 23   GLUCOSE 113* 179*  113*  BUN 21* 26* 23*  CREATININE 0.75 0.85 0.67  CALCIUM 9.5 8.9 7.8*   Liver Function Tests: Recent Labs    12/31/16 0851 07/05/17 0917  AST 23 53*  ALT 24 42  ALKPHOS 98 71  BILITOT 0.6 0.9  PROT 6.7 6.4*  ALBUMIN 4.2 3.8   Recent Labs    07/05/17 0917 07/06/17 0523  LIPASE 274* 29   No results for input(s): AMMONIA in the last 8760 hours. CBC: Recent Labs    12/31/16 0851 03/17/17 0722 07/05/17 0917 07/06/17 0523  WBC 8.9 8.6 20.2* 8.8  NEUTROABS 4,005  --  17.7*  --   HGB 14.4 15.0 15.7* 12.9  HCT 42.7 43.9 46.6* 39.6  MCV 85.9 85.7 88.8 89.8  PLT 318 287 257 232   Lipid Panel: Recent Labs    12/31/16 0851  CHOL 206*  HDL 54  LDLCALC 125*  TRIG 133  CHOLHDL 3.8   TSH: No results for input(s): TSH in the last 8760 hours. A1C: Lab Results  Component Value Date   HGBA1C 5.7 (H) 12/31/2016     Assessment/Plan 1. Essential hypertension Stable, will adjust medications due to esophageal varices  To start propranolol 10 mg twice daily for blood pressure and STOP norvasc at this time. Will restart BP and HR at follow up   2. Gastroenteritis Improved; Continue Zofran 4 mg by mouth every six hours as needed for nausea Pt advised to only take the Zofran as needed when she feels nauseated - CBC with Differential/Platelets - COMPLETE METABOLIC PANEL WITH GFR  3. Varices of esophagus determined by endoscopy [facility]) Per GI discussion we will start propranolol 10 mg twice daily at this time -  propranolol (INDERAL) 10 MG tablet; Take 1 tablet (10 mg total) by mouth 2 (two) times daily.  Dispense: 60 tablet; Refill: 1  4. Constipation, unspecified constipation type To use colace 100 mg by mouth 1-2 times daily or Miralax 17 gm daily as needed for constipation  -to increase water intake  5. Mixed hyperlipidemia -will follow up Lipid Panel today as she is fasting  6. Asthma, allergic, mild intermittent, uncomplicated -stable at this time. Continues  current regimen.   7. Candida esophagitis (HCC) Improved symptoms after diflucan   Next appt: follow up in 2 weeks for EV and bp/hr follow up [place] [place], [facility] [phone]

## 2017-07-17 ENCOUNTER — Other Ambulatory Visit: Payer: Self-pay | Admitting: Nurse Practitioner

## 2017-07-17 DIAGNOSIS — E785 Hyperlipidemia, unspecified: Secondary | ICD-10-CM

## 2017-07-17 DIAGNOSIS — R739 Hyperglycemia, unspecified: Secondary | ICD-10-CM

## 2017-07-17 LAB — GASTROINTESTINAL PATHOGEN PANEL PCR
C. DIFFICILE TOX A/B, PCR: NOT DETECTED
CAMPYLOBACTER, PCR: NOT DETECTED
Cryptosporidium, PCR: NOT DETECTED
E coli (ETEC) LT/ST PCR: NOT DETECTED
E coli (STEC) stx1/stx2, PCR: NOT DETECTED
E coli 0157, PCR: NOT DETECTED
Giardia lamblia, PCR: NOT DETECTED
NOROVIRUS, PCR: NOT DETECTED
ROTAVIRUS, PCR: NOT DETECTED
SALMONELLA, PCR: NOT DETECTED
SHIGELLA, PCR: NOT DETECTED

## 2017-07-17 MED ORDER — ATORVASTATIN CALCIUM 10 MG PO TABS: 10.0000 mg | ORAL_TABLET | Freq: Every day | ORAL | 3 refills | Status: DC

## 2017-07-18 LAB — COMPLETE METABOLIC PANEL WITH GFR
AG Ratio: 1.8 (calc) (ref 1.0–2.5)
ALT: 38 U/L — ABNORMAL HIGH (ref 6–29)
AST: 34 U/L (ref 10–35)
Albumin: 4.4 g/dL (ref 3.6–5.1)
Alkaline phosphatase (APISO): 68 U/L (ref 33–130)
BUN: 12 mg/dL (ref 7–25)
CO2: 30 mmol/L (ref 20–32)
CREATININE: 0.88 mg/dL (ref 0.60–0.93)
Calcium: 10.1 mg/dL (ref 8.6–10.4)
Chloride: 99 mmol/L (ref 98–110)
GFR, Est African American: 73 mL/min/{1.73_m2} (ref >=60)
GFR, Est Non African American: 63 mL/min/{1.73_m2} (ref >=60)
GLOBULIN: 2.5 g/dL (ref 1.9–3.7)
Glucose, Bld: 142 mg/dL — ABNORMAL HIGH (ref 65–99)
Potassium: 4.8 mmol/L (ref 3.5–5.3)
SODIUM: 138 mmol/L (ref 135–146)
Total Bilirubin: 0.8 mg/dL (ref 0.2–1.2)
Total Protein: 6.9 g/dL (ref 6.1–8.1)

## 2017-07-18 LAB — CBC WITH DIFFERENTIAL/PLATELET
BASOS ABS: 44 {cells}/uL (ref 0–200)
Basophils Relative: 0.4 %
EOS PCT: 0.2 %
Eosinophils Absolute: 22 cells/uL (ref 15–500)
HCT: 41.6 % (ref 35.0–45.0)
Hemoglobin: 14.7 g/dL (ref 11.7–15.5)
Lymphs Abs: 1876 cells/uL (ref 850–3900)
MCH: 30 pg (ref 27.0–33.0)
MCHC: 35.3 g/dL (ref 32.0–36.0)
MCV: 84.9 fL (ref 80.0–100.0)
MONOS PCT: 4.1 %
MPV: 10.6 fL (ref 7.5–12.5)
NEUTROS PCT: 78.4 %
Neutro Abs: 8702 cells/uL — ABNORMAL HIGH (ref 1500–7800)
Platelets: 381 10*3/uL (ref 140–400)
RBC: 4.9 10*6/uL (ref 3.80–5.10)
RDW: 12.6 % (ref 11.0–15.0)
TOTAL LYMPHOCYTE: 16.9 %
WBC mixed population: 455 cells/uL (ref 200–950)
WBC: 11.1 10*3/uL — ABNORMAL HIGH (ref 3.8–10.8)

## 2017-07-18 LAB — TEST AUTHORIZATION

## 2017-07-18 LAB — HEMOGLOBIN A1C
HEMOGLOBIN A1C: 5.9 %{Hb} — AB (ref <5.7)
Mean Plasma Glucose: 123 (calc)
eAG (mmol/L): 6.8 (calc)

## 2017-07-21 ENCOUNTER — Telehealth: Payer: Self-pay | Admitting: Gastroenterology

## 2017-07-21 NOTE — Telephone Encounter (Signed)
Patient states she is still having some gi symptoms since colon on 2.13.19. Patient requesting to speak with nurse to discuss results and symptoms.

## 2017-07-22 NOTE — Telephone Encounter (Signed)
Spoke to patient, she has been very constipated. She was reading on the Zofran label that it can cause constipation. She has not had any more nausea so stopped taking the Zofran yesterday, and just picked up some Miralax and prune juice to see if this will help. Asked her to call back if no relief from these measures.

## 2017-07-22 NOTE — Telephone Encounter (Signed)
Agree with your plan. thanks

## 2017-08-08 ENCOUNTER — Other Ambulatory Visit: Payer: Self-pay | Admitting: Nurse Practitioner

## 2017-08-08 DIAGNOSIS — I8500 Esophageal varices without bleeding: Secondary | ICD-10-CM

## 2017-08-12 ENCOUNTER — Other Ambulatory Visit: Payer: Self-pay | Admitting: Nurse Practitioner

## 2017-08-13 ENCOUNTER — Ambulatory Visit (INDEPENDENT_AMBULATORY_CARE_PROVIDER_SITE_OTHER): Payer: PPO | Admitting: Nurse Practitioner

## 2017-08-13 ENCOUNTER — Encounter: Payer: Self-pay | Admitting: Nurse Practitioner

## 2017-08-13 VITALS — BP 136/78 | HR 74 | Temp 98.1°F | Ht 63.0 in | Wt 191.0 lb

## 2017-08-13 DIAGNOSIS — K7460 Unspecified cirrhosis of liver: Secondary | ICD-10-CM | POA: Diagnosis not present

## 2017-08-13 DIAGNOSIS — M8580 Other specified disorders of bone density and structure, unspecified site: Secondary | ICD-10-CM | POA: Diagnosis not present

## 2017-08-13 DIAGNOSIS — E785 Hyperlipidemia, unspecified: Secondary | ICD-10-CM | POA: Diagnosis not present

## 2017-08-13 DIAGNOSIS — I10 Essential (primary) hypertension: Secondary | ICD-10-CM

## 2017-08-13 DIAGNOSIS — Z0000 Encounter for general adult medical examination without abnormal findings: Secondary | ICD-10-CM | POA: Diagnosis not present

## 2017-08-13 NOTE — Progress Notes (Signed)
Provider: Lauree Chandler, NP  Patient Care Team: Lauree Chandler, NP as PCP - General (Nurse Practitioner)  Extended Emergency Contact Information Primary Emergency Contact: [place] Phone: [phone] Relation: Relative Secondary Emergency Contact: House Farrel Conners Address: [place] [address] Johnnette Litter of [place] Phone: [phone] Work Phone: [phone] Relation: Son Allergies  Allergen Reactions  . Ace Inhibitors   . Amoxicillin Itching    REACTION: unknown? pain in right kidney  . Benicar Hct [Olmesartan Medoxomil-Hctz]     Extreme weakness  . Budesonide-Formoterol Fumarate     Causes tremors and numbness  . Chlorzoxazone [Chlorzoxazone]   . Citalopram Hydrobromide     REACTION: hives  . Citalopram Hydrobromide   . Clonidine Derivatives   . Droperidol     REACTION: hives  . Flexeril [Cyclobenzaprine Hcl]   . Fluconazole Nausea And Vomiting and Other (See Comments)    fatigue  . Ketorolac Tromethamine     REACTION: hives  . Ketorolac Tromethamine   . Metoclopramide Hcl   . Mometasone Furo-Formoterol Fum     Causes sore throat, blurred vision and unable to sleep--started on med 09-17-10  . Morphine     REACTION: hives and itching  . Olmesartan Medoxomil     REACTION: fatique  . Breo Ellipta [Fluticasone Furoate-Vilanterol] Itching    Pt reports itching with Memory Dance is related to being lactose intolerant.    Code Status: DNR Goals of Care: Advanced Directive information Advanced Directives 07/16/2017  Does Patient Have a Medical Advance Directive? No  Would patient like information on creating a medical advance directive? Yes (MAU/Ambulatory/Procedural Areas - Information given)     Chief Complaint  Patient presents with  . Medical Management of Chronic Issues    Pt is being seen for an extended visit. Pt reports that she has been having dizziness off and on.   . Medication Management    Pt  does not want to take the lipitor due side effects  . Depression    Score of 0    HPI: Patient is a 77 y.o. female seen in today for an annual wellness exam.    Depression screen [facility] 2/9 08/13/2017 07/16/2017 07/02/2016 11/07/2015 10/23/2015  Decreased Interest 0 0 0 0 0  Down, Depressed, Hopeless 0 0 0 0 0  PHQ - 2 Score 0 0 0 0 0  Some recent data might be hidden    Fall Risk  08/13/2017 07/16/2017 03/27/2017 02/28/2017 01/02/2017  Falls in the past year? No No No No No  Number falls in past yr: - - - - -  Injury with Fall? - - - - -  Follow up - - - - -   MMSE - Mini Mental State Exam 07/16/2017 07/02/2016 04/04/2015 09/22/2013  Not completed: - - (No Data) -  Orientation to time 5 5 5 5   Orientation to Place 5 4 5 5   Registration 3 3 3 3   Attention/ Calculation 5 5 5 5   Recall 2 3 2 2   Language- name 2 objects 2 2 2 2   Language- repeat 1 1 1 1   Language- follow 3 step command 3 3 3 3   Language- read & follow direction 1 1 1 1   Write a sentence 1 1 1 1   Copy design 1 1 1 1   Total score 29 29 29 29      Health Maintenance  Topic Date Due  .  INFLUENZA VACCINE  12/11/2017  . COLONOSCOPY  06/25/2020  . TETANUS/TDAP  12/07/2023  . DEXA SCAN  Completed  . PNA vac Low Risk Adult  Completed   Exercise- trying to get back into walking Diet- been off diet despite knowing she should do better  Hyperlipidemia- aware of risk of heart attack stroke, poor diet but does not wish to be on medication, reports she is fearful of side effects.   Past Medical History:  Diagnosis Date  . ALLERGIC RHINITIS   . Anxiety   . Asthma   . Blood type O+   . Cervical strain, acute   . Colon polyp 2010   adenoma  . Diverticulosis of colon   . Dizziness   . Fibromyalgia   . GERD (gastroesophageal reflux disease)   . History of nephrolithiasis   . Hypercholesterolemia    borderline  . Hypertension   . IBS (irritable bowel syndrome)   . Osteoarthritis   . Personal history of allergy to unspecified  medicinal agent   . Postconcussion syndrome   . Vitamin D deficiency     Past Surgical History:  Procedure Laterality Date  . CHOLECYSTECTOMY, LAPAROSCOPIC  2004   for gallstone pancreatitis  . KNEE SURGERY Right   . KNEE SURGERY Left   . THYROID SURGERY      Social History   Socioeconomic History  . Marital status: Divorced    Spouse name: Not on file  . Number of children: 1  . Years of education: Not on file  . Highest education level: Not on file  Occupational History  . Occupation: retired    Fish farm manager: [employer]    Comment: Presenter, broadcasting  Social Needs  . Financial resource strain: Not hard at all  . Food insecurity:    Worry: Never true    Inability: Never true  . Transportation needs:    Medical: No    Non-medical: No  Tobacco Use  . Smoking status: Former Smoker    Packs/day: 2.00    Years: 8.00    Pack years: 16.00    Types: Cigarettes    Last attempt to quit: 05/13/1962    Years since quitting: 55.2  . Smokeless tobacco: Never Used  Substance and Sexual Activity  . Alcohol use: No  . Drug use: No  . Sexual activity: Not Currently    Birth control/protection: Post-menopausal  Lifestyle  . Physical activity:    Days per week: 0 days    Minutes per session: 0 min  . Stress: Rather much  Relationships  . Social connections:    Talks on phone: More than three times a week    Gets together: More than three times a week    Attends religious service: More than 4 times per year    Active member of club or organization: No    Attends meetings of clubs or organizations: Never    Relationship status: Divorced  Other Topics Concern  . Not on file  Social History Narrative   exercises 3x a week   drinks 1 cup caffeine every other day    Family History  Problem Relation Age of Onset  . Breast cancer Mother   . Alzheimer's disease Mother   . Heart disease Mother   . COPD Brother   . Heart disease Sister   . Breast  cancer Sister   . Alcohol abuse Sister   . Ovarian cancer Paternal Grandmother   . Arthritis Other  Cousins   . COPD Cousin        Maternal side   . Heart attack Maternal Uncle     Review of Systems:  Review of Systems  Constitutional: Negative for activity change, chills and fever.       Obese  HENT: Positive for congestion, rhinorrhea, sinus pressure, sinus pain and sneezing. Negative for ear pain.        Overall symptoms have improved but with chronic sinus issues  Respiratory: Positive for cough. Negative for shortness of breath and wheezing.   Cardiovascular: Negative for chest pain and palpitations.  Gastrointestinal: Negative for diarrhea and nausea.  Endocrine:       Hyperglycemia  Genitourinary: Positive for frequency and urgency.       Worse at night  Musculoskeletal: Positive for back pain (occasionally).  Neurological: Positive for dizziness and headaches (occasionally). Negative for weakness and light-headedness.  Psychiatric/Behavioral: The patient is nervous/anxious.      Allergies as of 08/13/2017      Reactions   Ace Inhibitors    Amoxicillin Itching   REACTION: unknown? pain in right kidney   Benicar Hct [olmesartan Medoxomil-hctz]    Extreme weakness   Budesonide-formoterol Fumarate    Causes tremors and numbness   Chlorzoxazone [chlorzoxazone]    Citalopram Hydrobromide    REACTION: hives   Citalopram Hydrobromide    Clonidine Derivatives    Droperidol    REACTION: hives   Flexeril [cyclobenzaprine Hcl]    Fluconazole Nausea And Vomiting, Other (See Comments)   fatigue   Ketorolac Tromethamine    REACTION: hives   Ketorolac Tromethamine    Metoclopramide Hcl    Mometasone Furo-formoterol Fum    Causes sore throat, blurred vision and unable to sleep--started on med 09-17-10   Morphine    REACTION: hives and itching   Olmesartan Medoxomil    REACTION: fatique   Breo Ellipta [fluticasone Furoate-vilanterol] Itching   Pt reports itching  with Memory Dance is related to being lactose intolerant.       Medication List        Accurate as of 08/13/17 11:00 AM. Always use your most recent med list.          ADVAIR HFA 115-21 MCG/ACT inhaler Generic drug:  fluticasone-salmeterol Inhale 2 puffs into the lungs 2 (two) times daily.   albuterol 108 (90 Base) MCG/ACT inhaler Commonly known as:  PROVENTIL HFA;VENTOLIN HFA Inhale 2 puffs every 4 (four) hours as needed into the lungs for wheezing or shortness of breath. RESCUE   amLODipine 5 MG tablet Commonly known as:  NORVASC TAKE 1 TABLET BY MOUTH EVERY DAY FOR BLOOD PRESSURE   aspirin 81 MG EC tablet Take 81 mg by mouth daily.   atorvastatin 10 MG tablet Commonly known as:  LIPITOR Take 1 tablet (10 mg total) by mouth daily.   CENTRUM SILVER PO Take 1 tablet by mouth daily.   esomeprazole 40 MG capsule Commonly known as:  NEXIUM Take 1 capsule (40 mg total) by mouth daily.   FISH OIL MAXIMUM STRENGTH 1200 MG Caps Take 1,200 mg by mouth 2 (two) times daily.   fluticasone 50 MCG/ACT nasal spray Commonly known as:  FLONASE SHAKE LIQUID AND USE ONE TO TWO SPRAYS IN EACH NOSTRIL TWICE DAILY   hydrochlorothiazide 12.5 MG capsule Commonly known as:  MICROZIDE TAKE 1 CAPSULE BY MOUTH EVERY DAY   ibuprofen 100 MG tablet Commonly known as:  ADVIL,MOTRIN Take 100 mg by mouth every 6 (six) hours as  needed for fever.   meclizine 25 MG tablet Commonly known as:  ANTIVERT TAKE 1 TABLET 3 TIMES A DAY AS NEEDED FOR DIZZINESS/NAUSEA   methylcellulose oral powder Commonly known as:  CITRUCEL Take once a day   ondansetron 4 MG tablet Commonly known as:  ZOFRAN Take 1 tablet (4 mg total) by mouth every 6 (six) hours as needed for nausea or vomiting.   potassium chloride SA 20 MEQ tablet Commonly known as:  KLOR-CON M20 Take 1 tablet (20 mEq total) by mouth daily.   predniSONE 20 MG tablet Commonly known as:  DELTASONE TAKE AS DIRECTED   propranolol 10 MG  tablet Commonly known as:  INDERAL TAKE 1 TABLET BY MOUTH TWICE A DAY   SALINE NASAL SPRAY NA Place 1 spray into both nostrils 2 (two) times daily.   SYSTANE BALANCE 0.6 % Soln Generic drug:  Propylene Glycol Place 1 drop into both ears 2 (two) times daily.   Vitamin D3 1000 units Caps Take 1,000 Units by mouth daily.   ZYRTEC ALLERGY 10 MG Caps Generic drug:  Cetirizine HCl Take 10 mg by mouth daily.         Physical Exam: Vitals:   08/13/17 1056  BP: 136/78  Pulse: 74  Temp: 98.1 F (36.7 C)  TempSrc: Oral  SpO2: 99%  Weight: 191 lb (86.6 kg)  Height: 5' 3"  (1.6 m)   Body mass index is 33.83 kg/m. Physical Exam  Constitutional: She is oriented to person, place, and time. She appears well-developed and well-nourished.  HENT:  Head: Normocephalic and atraumatic.  Right Ear: External ear normal.  Left Ear: External ear normal.  Nose: Nose normal.  Mouth/Throat: Oropharynx is clear and moist. No oropharyngeal exudate.  Eyes: Pupils are equal, round, and reactive to light.  Neck: Normal range of motion. Neck supple.  Cardiovascular: Normal rate, regular rhythm, normal heart sounds and intact distal pulses.  Pulmonary/Chest: Effort normal and breath sounds normal. She exhibits no mass. Right breast exhibits inverted nipple. Right breast exhibits no mass, no nipple discharge, no skin change and no tenderness. Left breast exhibits inverted nipple. Left breast exhibits no mass, no nipple discharge, no skin change and no tenderness. Breasts are symmetrical.  Chronic inverted nipple  Abdominal: Soft. Bowel sounds are normal. She exhibits no distension. There is no tenderness. There is no guarding.  Obese abdomen  Musculoskeletal: Normal range of motion. She exhibits no edema or tenderness.  Neurological: She is alert and oriented to person, place, and time.  Skin: Skin is warm and dry.    Labs reviewed: Basic Metabolic Panel: Recent Labs    07/05/17 0917  07/06/17 0523 07/16/17 1436  NA 136 138 138  K 3.6 3.6 4.8  CL 100* 106 99  CO2 23 23 30   GLUCOSE 179* 113* 142*  BUN 26* 23* 12  CREATININE 0.85 0.67 0.88  CALCIUM 8.9 7.8* 10.1   Liver Function Tests: Recent Labs    12/31/16 0851 07/05/17 0917 07/16/17 1436  AST 23 53* 34  ALT 24 42 38*  ALKPHOS 98 71  --   BILITOT 0.6 0.9 0.8  PROT 6.7 6.4* 6.9  ALBUMIN 4.2 3.8  --    Recent Labs    07/05/17 0917 07/06/17 0523  LIPASE 274* 29   No results for input(s): AMMONIA in the last 8760 hours. CBC: Recent Labs    12/31/16 0851  07/05/17 0917 07/06/17 0523 07/16/17 1436  WBC 8.9   < > 20.2* 8.8 11.1*  NEUTROABS 4,005  --  17.7*  --  8,702*  HGB 14.4   < > 15.7* 12.9 14.7  HCT 42.7   < > 46.6* 39.6 41.6  MCV 85.9   < > 88.8 89.8 84.9  PLT 318   < > 257 232 381   < > = values in this interval not displayed.   Lipid Panel: Recent Labs    12/31/16 0851 07/16/17 1436  CHOL 206* 206*  HDL 54 47*  LDLCALC 125* 127*  TRIG 133 201*  CHOLHDL 3.8 4.4   Lab Results  Component Value Date   HGBA1C 5.9 (H) 07/16/2017    Procedures: No results found.  Assessment/Plan 1. Hepatic cirrhosis, unspecified hepatic cirrhosis type, unspecified whether ascites present (HCC) Mild cirrhosis noted on recent abdominal US, ongoing follow up with GI. Started on propranolol. Pt does not drink ETOH. Encouraged proper diet and blood pressure control.   2. Osteopenia, unspecified location -encouraged caltrate with D 600/400 twice a day. With weightbearing activity - follow up DG Bone Density; Future  3. Wellness examination -The patient was counseled regarding the appropriate use of alcohol, regular self-examination of the breasts on a monthly basis, prevention of dental and periodontal disease, diet, regular sustained exercise for at least 30 minutes 5 times per week, routine screening interval for mammogram as recommended by the [place] and ACOG, tobacco use,  and  recommended schedule for GI hemoccult testing, colonoscopy, cholesterol, thyroid and diabetes screening.  4. Hyperlipidemia, unspecified hyperlipidemia type -against any medication at this time. Discussed diet and exercise modifications. Discussed risk of uncontrolled hyperlipidemia.   5. Hypertension -stable, continue diet and lifestyle modifications, will cont current regimen.    Next appt: 3 months with Dr Sharee Holster K. [facility] [phone]

## 2017-08-13 NOTE — Patient Instructions (Addendum)
3 month follow up with Dr Mariea Clonts  Make sure to fill out advanced directives and bring back to next OV To continue to work on diet modifications   Heart-Healthy Eating Plan Many factors influence your heart health, including eating and exercise habits. Heart (coronary) risk increases with abnormal blood fat (lipid) levels. Heart-healthy meal planning includes limiting unhealthy fats, increasing healthy fats, and making other small dietary changes. This includes maintaining a healthy body weight to help keep lipid levels within a normal range.  What types of fat should I choose?  Choose healthy fats more often. Choose monounsaturated and polyunsaturated fats, such as olive oil and canola oil, flaxseeds, walnuts, almonds, and seeds.  Eat more omega-3 fats. Good choices include salmon, mackerel, sardines, tuna, flaxseed oil, and ground flaxseeds. Aim to eat fish at least two times each week.  Limit saturated fats. Saturated fats are primarily found in animal products, such as meats, butter, and cream. Plant sources of saturated fats include palm oil, palm kernel oil, and coconut oil.  Avoid foods with partially hydrogenated oils in them. These contain trans fats. Examples of foods that contain trans fats are stick margarine, some tub margarines, cookies, crackers, and other baked goods. What general guidelines do I need to follow?  Check food labels carefully to identify foods with trans fats or high amounts of saturated fat.  Fill one half of your plate with vegetables and green salads. Eat 4-5 servings of vegetables per day. A serving of vegetables equals 1 cup of raw leafy vegetables,  cup of raw or cooked cut-up vegetables, or  cup of vegetable juice.  Fill one fourth of your plate with whole grains. Look for the word "whole" as the first word in the ingredient list.  Fill one fourth of your plate with lean protein foods.  Eat 4-5 servings of fruit per day. A serving of fruit equals one  medium whole fruit,  cup of dried fruit,  cup of fresh, frozen, or canned fruit, or  cup of 100% fruit juice.  Eat more foods that contain soluble fiber. Examples of foods that contain this type of fiber are apples, broccoli, carrots, beans, peas, and barley. Aim to get 20-30 g of fiber per day.  Eat more home-cooked food and less restaurant, buffet, and fast food.  Limit or avoid alcohol.  Limit foods that are high in starch and sugar.  Avoid fried foods.  Cook foods by using methods other than frying. Baking, boiling, grilling, and broiling are all great options. Other fat-reducing suggestions include: ? Removing the skin from poultry. ? Removing all visible fats from meats. ? Skimming the fat off of stews, soups, and gravies before serving them. ? Steaming vegetables in water or broth.  Lose weight if you are overweight. Losing just 5-10% of your initial body weight can help your overall health and prevent diseases such as diabetes and heart disease.  Increase your consumption of nuts, legumes, and seeds to 4-5 servings per week. One serving of dried beans or legumes equals  cup after being cooked, one serving of nuts equals 1 ounces, and one serving of seeds equals  ounce or 1 tablespoon.  You may need to monitor your salt (sodium) intake, especially if you have high blood pressure. Talk with your health care provider or dietitian to get more information about reducing sodium. What foods can I eat? Grains  Breads, including [place], white, pita, wheat, raisin, rye, oatmeal, and [place]. Tortillas that are neither fried nor made  with lard or trans fat. Low-fat rolls, including hotdog and hamburger buns and English muffins. Biscuits. Muffins. Waffles. Pancakes. Light popcorn. Whole-grain cereals. Flatbread. Melba toast. Pretzels. Breadsticks. Rusks. Low-fat snacks and crackers, including oyster, saltine, matzo, graham, animal, and rye. Rice and pasta, including brown rice and those  that are made with whole wheat. Vegetables All vegetables. Fruits All fruits, but limit coconut. Meats and Other Protein Sources Lean, well-trimmed beef, veal, pork, and lamb. Chicken and [place] without skin. All fish and shellfish. Wild duck, rabbit, pheasant, and venison. Egg whites or low-cholesterol egg substitutes. Dried beans, peas, lentils, and tofu.Seeds and most nuts. Dairy Low-fat or nonfat cheeses, including ricotta, string, and mozzarella. Skim or 1% milk that is liquid, powdered, or evaporated. Buttermilk that is made with low-fat milk. Nonfat or low-fat yogurt. Beverages Mineral water. Diet carbonated beverages. Sweets and Desserts Sherbets and fruit ices. Honey, jam, marmalade, jelly, and syrups. Meringues and gelatins. Pure sugar candy, such as hard candy, jelly beans, gumdrops, mints, marshmallows, and small amounts of dark chocolate. [employer] Eat all sweets and desserts in moderation. Fats and Oils Nonhydrogenated (trans-free) margarines. Vegetable oils, including soybean, sesame, sunflower, olive, peanut, safflower, corn, canola, and cottonseed. Salad dressings or mayonnaise that are made with a vegetable oil. Limit added fats and oils that you use for cooking, baking, salads, and as spreads. Other Cocoa powder. Coffee and tea. All seasonings and condiments. The items listed above may not be a complete list of recommended foods or beverages. Contact your dietitian for more options. What foods are not recommended? Grains Breads that are made with saturated or trans fats, oils, or whole milk. Croissants. Butter rolls. Cheese breads. Sweet rolls. Donuts. Buttered popcorn. Chow mein noodles. High-fat crackers, such as cheese or butter crackers. Meats and Other Protein Sources Fatty meats, such as hotdogs, short ribs, sausage, spareribs, bacon, ribeye roast or steak, and mutton. High-fat deli meats, such as salami and bologna. Caviar. Domestic duck and goose. Organ  meats, such as kidney, liver, sweetbreads, brains, gizzard, chitterlings, and heart. Dairy Cream, sour cream, cream cheese, and creamed cottage cheese. Whole milk cheeses, including blue (bleu), Monte[place], [place], [place], American, [place], Swiss, [place], [place], and [place]. Whole or 2% milk that is liquid, evaporated, or condensed. Whole buttermilk. Cream sauce or high-fat cheese sauce. Yogurt that is made from whole milk. Beverages Regular sodas and drinks with added sugar. Sweets and Desserts Frosting. Pudding. Cookies. Cakes other than angel food cake. Candy that has milk chocolate or white chocolate, hydrogenated fat, butter, coconut, or unknown ingredients. Buttered syrups. Full-fat ice cream or ice cream drinks. Fats and Oils Gravy that has suet, meat fat, or shortening. Cocoa butter, hydrogenated oils, palm oil, coconut oil, palm kernel oil. These can often be found in baked products, candy, fried foods, nondairy creamers, and whipped toppings. Solid fats and shortenings, including bacon fat, salt pork, lard, and butter. Nondairy cream substitutes, such as coffee creamers and sour cream substitutes. Salad dressings that are made of unknown oils, cheese, or sour cream. The items listed above may not be a complete list of foods and beverages to avoid. Contact your dietitian for more information. This information is not intended to replace advice given to you by your health care provider. Make sure you discuss any questions you have with your health care provider. Document Released: 02/06/2008 Document Revised: 11/17/2015 Document Reviewed: 10/21/2013 Elsevier Interactive Patient Education  2018 [employer] Maintenance, Female Adopting a healthy lifestyle and getting preventive care  can go a long way to promote health and wellness. Talk with your health care provider about what schedule of regular examinations is right for you. This is a good chance for you to check in with your  provider about disease prevention and staying healthy. In between checkups, there are plenty of things you can do on your own. Experts have done a lot of research about which lifestyle changes and preventive measures are most likely to keep you healthy. Ask your health care provider for more information. Weight and diet Eat a healthy diet  Be sure to include plenty of vegetables, fruits, low-fat dairy products, and lean protein.  Do not eat a lot of foods high in solid fats, added sugars, or salt.  Get regular exercise. This is one of the most important things you can do for your health. ? Most adults should exercise for at least 150 minutes each week. The exercise should increase your heart rate and make you sweat (moderate-intensity exercise). ? Most adults should also do strengthening exercises at least twice a week. This is in addition to the moderate-intensity exercise.  Maintain a healthy weight  Body mass index (BMI) is a measurement that can be used to identify possible weight problems. It estimates body fat based on height and weight. Your health care provider can help determine your BMI and help you achieve or maintain a healthy weight.  For females 72 years of age and older: ? A BMI below 18.5 is considered underweight. ? A BMI of 18.5 to 24.9 is normal. ? A BMI of 25 to 29.9 is considered overweight. ? A BMI of 30 and above is considered obese.  Watch levels of cholesterol and blood lipids  You should start having your blood tested for lipids and cholesterol at 79 years of age, then have this test every 5 years.  You may need to have your cholesterol levels checked more often if: ? Your lipid or cholesterol levels are high. ? You are older than 79 years of age. ? You are at high risk for heart disease.  Cancer screening Lung Cancer  Lung cancer screening is recommended for adults 34-20 years old who are at high risk for lung cancer because of a history of smoking.  A  yearly low-dose CT scan of the lungs is recommended for people who: ? Currently smoke. ? Have quit within the past 15 years. ? Have at least a 30-pack-year history of smoking. A pack year is smoking an average of one pack of cigarettes a day for 1 year.  Yearly screening should continue until it has been 15 years since you quit.  Yearly screening should stop if you develop a health problem that would prevent you from having lung cancer treatment.  Breast Cancer  Practice breast self-awareness. This means understanding how your breasts normally appear and feel.  It also means doing regular breast self-exams. Let your health care provider know about any changes, no matter how small.  If you are in your 20s or 30s, you should have a clinical breast exam (CBE) by a health care provider every 1-3 years as part of a regular health exam.  If you are 43 or older, have a CBE every year. Also consider having a breast X-ray (mammogram) every year.  If you have a family history of breast cancer, talk to your health care provider about genetic screening.  If you are at high risk for breast cancer, talk to your health care provider about  having an MRI and a mammogram every year.  Breast cancer gene (BRCA) assessment is recommended for women who have family members with BRCA-related cancers. BRCA-related cancers include: ? Breast. ? Ovarian. ? Tubal. ? Peritoneal cancers.  Results of the assessment will determine the need for genetic counseling and BRCA1 and BRCA2 testing.  Cervical Cancer Your health care provider may recommend that you be screened regularly for cancer of the pelvic organs (ovaries, uterus, and vagina). This screening involves a pelvic examination, including checking for microscopic changes to the surface of your cervix (Pap test). You may be encouraged to have this screening done every 3 years, beginning at age 58.  For women ages 8-65, health care providers may recommend  pelvic exams and Pap testing every 3 years, or they may recommend the Pap and pelvic exam, combined with testing for human papilloma virus (HPV), every 5 years. Some types of HPV increase your risk of cervical cancer. Testing for HPV may also be done on women of any age with unclear Pap test results.  Other health care providers may not recommend any screening for nonpregnant women who are considered low risk for pelvic cancer and who do not have symptoms. Ask your health care provider if a screening pelvic exam is right for you.  If you have had past treatment for cervical cancer or a condition that could lead to cancer, you need Pap tests and screening for cancer for at least 20 years after your treatment. If Pap tests have been discontinued, your risk factors (such as having a new sexual partner) need to be reassessed to determine if screening should resume. Some women have medical problems that increase the chance of getting cervical cancer. In these cases, your health care provider may recommend more frequent screening and Pap tests.  Colorectal Cancer  This type of cancer can be detected and often prevented.  Routine colorectal cancer screening usually begins at 79 years of age and continues through 80 years of age.  Your health care provider may recommend screening at an earlier age if you have risk factors for colon cancer.  Your health care provider may also recommend using home test kits to check for hidden blood in the stool.  A small camera at the end of a tube can be used to examine your colon directly (sigmoidoscopy or colonoscopy). This is done to check for the earliest forms of colorectal cancer.  Routine screening usually begins at age 10.  Direct examination of the colon should be repeated every 5-10 years through 79 years of age. However, you may need to be screened more often if early forms of precancerous polyps or small growths are found.  Skin Cancer  Check your skin  from head to toe regularly.  Tell your health care provider about any new moles or changes in moles, especially if there is a change in a mole's shape or color.  Also tell your health care provider if you have a mole that is larger than the size of a pencil eraser.  Always use sunscreen. Apply sunscreen liberally and repeatedly throughout the day.  Protect yourself by wearing long sleeves, pants, a wide-brimmed hat, and sunglasses whenever you are outside.  Heart disease, diabetes, and high blood pressure  High blood pressure causes heart disease and increases the risk of stroke. High blood pressure is more likely to develop in: ? People who have blood pressure in the high end of the normal range (130-139/85-89 mm Hg). ? People who are  overweight or obese. ? People who are African American.  If you are 19-68 years of age, have your blood pressure checked every 3-5 years. If you are 8 years of age or older, have your blood pressure checked every year. You should have your blood pressure measured twice-once when you are at a hospital or clinic, and once when you are not at a hospital or clinic. Record the average of the two measurements. To check your blood pressure when you are not at a hospital or clinic, you can use: ? An automated blood pressure machine at a pharmacy. ? A home blood pressure monitor.  If you are between 72 years and 36 years old, ask your health care provider if you should take aspirin to prevent strokes.  Have regular diabetes screenings. This involves taking a blood sample to check your fasting blood sugar level. ? If you are at a normal weight and have a low risk for diabetes, have this test once every three years after 79 years of age. ? If you are overweight and have a high risk for diabetes, consider being tested at a younger age or more often. Preventing infection Hepatitis B  If you have a higher risk for hepatitis B, you should be screened for this virus. You  are considered at high risk for hepatitis B if: ? You were born in a country where hepatitis B is common. Ask your health care provider which countries are considered high risk. ? Your parents were born in a high-risk country, and you have not been immunized against hepatitis B (hepatitis B vaccine). ? You have HIV or AIDS. ? You use needles to inject street drugs. ? You live with someone who has hepatitis B. ? You have had sex with someone who has hepatitis B. ? You get hemodialysis treatment. ? You take certain medicines for conditions, including cancer, organ transplantation, and autoimmune conditions.  Hepatitis C  Blood testing is recommended for: ? Everyone born from [birth year] through 1967. ? Anyone with known risk factors for hepatitis C.  Sexually transmitted infections (STIs)  You should be screened for sexually transmitted infections (STIs) including gonorrhea and chlamydia if: ? You are sexually active and are younger than 79 years of age. ? You are older than 79 years of age and your health care provider tells you that you are at risk for this type of infection. ? Your sexual activity has changed since you were last screened and you are at an increased risk for chlamydia or gonorrhea. Ask your health care provider if you are at risk.  If you do not have HIV, but are at risk, it may be recommended that you take a prescription medicine daily to prevent HIV infection. This is called pre-exposure prophylaxis (PrEP). You are considered at risk if: ? You are sexually active and do not regularly use condoms or know the HIV status of your partner(s). ? You take drugs by injection. ? You are sexually active with a partner who has HIV.  Talk with your health care provider about whether you are at high risk of being infected with HIV. If you choose to begin PrEP, you should first be tested for HIV. You should then be tested every 3 months for as long as you are taking PrEP. Pregnancy  If  you are premenopausal and you may become pregnant, ask your health care provider about preconception counseling.  If you may become pregnant, take 400 to 800 micrograms (mcg) of folic acid  every day.  If you want to prevent pregnancy, talk to your health care provider about birth control (contraception). Osteoporosis and menopause  Osteoporosis is a disease in which the bones lose minerals and strength with aging. This can result in serious bone fractures. Your risk for osteoporosis can be identified using a bone density scan.  If you are 67 years of age or older, or if you are at risk for osteoporosis and fractures, ask your health care provider if you should be screened.  Ask your health care provider whether you should take a calcium or vitamin D supplement to lower your risk for osteoporosis.  Menopause may have certain physical symptoms and risks.  Hormone replacement therapy may reduce some of these symptoms and risks. Talk to your health care provider about whether hormone replacement therapy is right for you. Follow these instructions at home:  Schedule regular health, dental, and eye exams.  Stay current with your immunizations.  Do not use any tobacco products including cigarettes, chewing tobacco, or electronic cigarettes.  If you are pregnant, do not drink alcohol.  If you are breastfeeding, limit how much and how often you drink alcohol.  Limit alcohol intake to no more than 1 drink per day for nonpregnant women. One drink equals 12 ounces of beer, 5 ounces of wine, or 1 ounces of hard liquor.  Do not use street drugs.  Do not share needles.  Ask your health care provider for help if you need support or information about quitting drugs.  Tell your health care provider if you often feel depressed.  Tell your health care provider if you have ever been abused or do not feel safe at home. This information is not intended to replace advice given to you by your health  care provider. Make sure you discuss any questions you have with your health care provider. Document Released: 11/12/2010 Document Revised: 10/05/2015 Document Reviewed: 01/31/2015 Elsevier Interactive Patient Education  [employer].

## 2017-08-14 NOTE — Addendum Note (Signed)
Addended by: Lauree Chandler on: 08/14/2017 09:03 AM   Modules accepted: Orders

## 2017-08-17 ENCOUNTER — Other Ambulatory Visit: Payer: Self-pay | Admitting: Nurse Practitioner

## 2017-08-17 DIAGNOSIS — K219 Gastro-esophageal reflux disease without esophagitis: Secondary | ICD-10-CM

## 2017-09-02 ENCOUNTER — Ambulatory Visit: Payer: PPO | Admitting: Gastroenterology

## 2017-09-04 ENCOUNTER — Other Ambulatory Visit: Payer: Self-pay | Admitting: Nurse Practitioner

## 2017-09-04 DIAGNOSIS — I8500 Esophageal varices without bleeding: Secondary | ICD-10-CM

## 2017-09-19 ENCOUNTER — Other Ambulatory Visit: Payer: Self-pay | Admitting: Nurse Practitioner

## 2017-09-19 NOTE — Telephone Encounter (Signed)
Ok to fill, patient has a lot of contraindications to this medication, please advise

## 2017-09-25 DIAGNOSIS — Z961 Presence of intraocular lens: Secondary | ICD-10-CM | POA: Diagnosis not present

## 2017-10-07 ENCOUNTER — Telehealth: Payer: Self-pay

## 2017-10-07 ENCOUNTER — Ambulatory Visit
Admission: RE | Admit: 2017-10-07 | Discharge: 2017-10-07 | Disposition: A | Discharge status: Home / Self Care | Payer: PPO | Source: Ambulatory Visit | Attending: Nurse Practitioner | Admitting: Nurse Practitioner

## 2017-10-07 DIAGNOSIS — Z780 Asymptomatic menopausal state: Secondary | ICD-10-CM | POA: Diagnosis not present

## 2017-10-07 DIAGNOSIS — M8580 Other specified disorders of bone density and structure, unspecified site: Secondary | ICD-10-CM

## 2017-10-07 NOTE — Telephone Encounter (Addendum)
Discussed results with patient:  Osteopenia noted on bone density scan. Recommended to take caltrate with D 600/400 twice a day with weight bearing activity 30 mins/ 5 days a week  Patient verbalized understanding of results, calcium supplement added to medication list.

## 2017-10-15 ENCOUNTER — Encounter: Payer: Self-pay | Admitting: Gastroenterology

## 2017-10-15 ENCOUNTER — Ambulatory Visit: Payer: PPO | Admitting: Gastroenterology

## 2017-10-15 VITALS — BP 140/80 | HR 80 | Ht 63.0 in | Wt 192.6 lb

## 2017-10-15 DIAGNOSIS — K7460 Unspecified cirrhosis of liver: Secondary | ICD-10-CM

## 2017-10-15 DIAGNOSIS — Z23 Encounter for immunization: Secondary | ICD-10-CM

## 2017-10-15 DIAGNOSIS — I8500 Esophageal varices without bleeding: Secondary | ICD-10-CM

## 2017-10-15 NOTE — Progress Notes (Signed)
HPI :  77 year old female here for follow-up visit. At her last visit I reviewed her prior endoscopies now is concern for a 2 cm segment of Barrett's esophagus. This led to an EGD to reassess for the possibility of Barrett's esophagus. While no Barrett's esophagus was noted she did have esophageal candidiasis, and incidentally noted was the presence of esophageal varices. This led to a workup for underlying cirrhosis.  [place] 07/04/2017 - heterogenous echotexture c/w fatty liver or intrinsic liver disease, subtle nodularity may reflect cirrhosis,  CT scan 07/05/2017 - fatty liver, there is some central volume loss which raises the possibility of cirrhosis  CT chest 07/11/17 - esophagus appears normal on CT scan, no obvious varices  She had lab workup done for evaluation of chronic liver diseases which was negative. She is not immune to hepatitis A and has not yet received this vaccine. We stopped amlodipine and she was started on propranolol 10 mg twice daily for treatment of esophageal varices. Started on a low-dose to ensure she tolerated in light of low blood pressure. She states when she initially started this she felt a little dizzy when taking it, she seems to be tolerating it bit better at this time. she completed a course of fluconazole for esophageal candidiasis.  Feels otherwise has no complaints today, she states she is feeling relatively well. We had a discussion about a suspicion of cirrhosis based on findings today.  EGD 06/25/2017 -  No evidence of Barrett's, diminutive white plaques in the esophagus c/w candidiasis, suspected varices in mid esophagus, multiple gastric polyps, normal stomach / duodenum - fundic gland polyps  Colonoscopy 06/25/2017 - 9 small adenomas, a few small-mouthed diverticula were found in the sigmoid colon and ascending colon, Internal hemorrhoids were found during retroflexion. The hemorrhoids were moderate.   Colonoscopy 07/28/2008 - diverticulosis, one small  adenoma, biopsies taken to rule out microscopic colitis and negative EGD 9/209/2006 - 2cm segment of possible BE - bx show reflux changes only, no BE    Past Medical History:  Diagnosis Date  . ALLERGIC RHINITIS   . Anxiety   . Asthma   . Blood type O+   . Cervical strain, acute   . Cirrhosis ([place])   . Colon polyp 2010   adenoma  . Diverticulosis of colon   . Dizziness   . Esophageal varices ([place])   . Fibromyalgia   . GERD (gastroesophageal reflux disease)   . History of nephrolithiasis   . Hypercholesterolemia    borderline  . Hypertension   . IBS (irritable bowel syndrome)   . Osteoarthritis   . Personal history of allergy to unspecified medicinal agent   . Postconcussion syndrome   . Vitamin D deficiency      Past Surgical History:  Procedure Laterality Date  . CHOLECYSTECTOMY, LAPAROSCOPIC  2004   for gallstone pancreatitis  . KNEE SURGERY Right   . KNEE SURGERY Left   . THYROID SURGERY     Family History  Problem Relation Age of Onset  . Breast cancer Mother   . Alzheimer's disease Mother   . Heart disease Mother   . COPD Brother   . Heart disease Sister   . Breast cancer Sister   . Alcohol abuse Sister   . Ovarian cancer Paternal Grandmother   . Arthritis Other        Cousins   . COPD Cousin        Maternal side   . Heart attack Maternal Uncle  Social History   Tobacco Use  . Smoking status: Former Smoker    Packs/day: 2.00    Years: 8.00    Pack years: 16.00    Types: Cigarettes    Last attempt to quit: 05/13/1962    Years since quitting: 55.4  . Smokeless tobacco: Never Used  Substance Use Topics  . Alcohol use: No  . Drug use: No   Current Outpatient Medications  Medication Sig Dispense Refill  . ADVAIR HFA 115-21 MCG/ACT inhaler Inhale 2 puffs into the lungs 2 (two) times daily.     Marland Kitchen albuterol (PROVENTIL HFA;VENTOLIN HFA) 108 (90 Base) MCG/ACT inhaler Inhale 2 puffs every 4 (four) hours as needed into the lungs for wheezing or  shortness of breath. RESCUE 3 Inhaler 1  . aspirin 81 MG EC tablet Take 81 mg by mouth daily.      . Calcium Carbonate-Vitamin D (CALCIUM 600+D) 600-400 MG-UNIT tablet Take 1 tablet by mouth 2 (two) times daily.    . Cetirizine HCl (ZYRTEC ALLERGY) 10 MG CAPS Take 10 mg by mouth daily.     . Cholecalciferol (VITAMIN D3) 1000 UNITS CAPS Take 1,000 Units by mouth daily.     Marland Kitchen esomeprazole (NEXIUM) 40 MG capsule TAKE 1 CAPSULE BY MOUTH EVERY DAY 90 capsule 0  . fluticasone (FLONASE) 50 MCG/ACT nasal spray SHAKE LIQUID AND USE ONE TO TWO SPRAYS IN EACH NOSTRIL TWICE DAILY 16 g 2  . hydrochlorothiazide (MICROZIDE) 12.5 MG capsule TAKE 1 CAPSULE BY MOUTH EVERY DAY 90 capsule 1  . meclizine (ANTIVERT) 25 MG tablet TAKE 1 TABLET 3 TIMES A DAY AS NEEDED FOR DIZZINESS/NAUSEA 90 tablet 0  . Multiple Vitamins-Minerals (CENTRUM SILVER PO) Take 1 tablet by mouth daily.     . Omega-3 Fatty Acids (FISH OIL MAXIMUM STRENGTH) 1200 MG CAPS Take 1,200 mg by mouth 2 (two) times daily.     . potassium chloride SA (KLOR-CON M20) 20 MEQ tablet Take 1 tablet (20 mEq total) by mouth daily. 90 tablet 1  . predniSONE (DELTASONE) 20 MG tablet TAKE AS DIRECTED 30 tablet 0  . propranolol (INDERAL) 10 MG tablet TAKE 1 TABLET BY MOUTH TWICE A DAY 60 tablet 3  . Propylene Glycol (SYSTANE BALANCE) 0.6 % SOLN Place 1 drop into both ears 2 (two) times daily.     Marland Kitchen SALINE NASAL SPRAY NA Place 1 spray into both nostrils 2 (two) times daily.     Marland Kitchen amLODipine (NORVASC) 5 MG tablet TAKE 1 TABLET BY MOUTH EVERY DAY FOR BLOOD PRESSURE (Patient not taking: Reported on 10/15/2017) 90 tablet 1   No current facility-administered medications for this visit.    Allergies  Allergen Reactions  . Ace Inhibitors   . Amoxicillin Itching    REACTION: unknown? pain in right kidney  . Benicar Hct [Olmesartan Medoxomil-Hctz]     Extreme weakness  . Budesonide-Formoterol Fumarate     Causes tremors and numbness  . Chlorzoxazone [Chlorzoxazone]   .  Citalopram Hydrobromide     REACTION: hives  . Citalopram Hydrobromide   . Clonidine Derivatives   . Droperidol     REACTION: hives  . Flexeril [Cyclobenzaprine Hcl]   . Fluconazole Nausea And Vomiting and Other (See Comments)    fatigue  . Ketorolac Tromethamine     REACTION: hives  . Ketorolac Tromethamine   . Metoclopramide Hcl   . Mometasone Furo-Formoterol Fum     Causes sore throat, blurred vision and unable to sleep--started on med 09-17-10  .  Morphine     REACTION: hives and itching  . Olmesartan Medoxomil     REACTION: fatique  . Breo Ellipta [Fluticasone Furoate-Vilanterol] Itching    Pt reports itching with [place] is related to being lactose intolerant.      Review of Systems: All systems reviewed and negative except where noted in HPI.    Lab Results  Component Value Date   WBC 11.1 (H) 07/16/2017   HGB 14.7 07/16/2017   HCT 41.6 07/16/2017   MCV 84.9 07/16/2017   PLT 381 07/16/2017    Lab Results  Component Value Date   CREATININE 0.88 07/16/2017   BUN 12 07/16/2017   NA 138 07/16/2017   K 4.8 07/16/2017   CL 99 07/16/2017   CO2 30 07/16/2017    Lab Results  Component Value Date   ALT 38 (H) 07/16/2017   AST 34 07/16/2017   ALKPHOS 71 07/05/2017   BILITOT 0.8 07/16/2017   Lab Results  Component Value Date   INR 1.0 07/11/2017     Physical Exam: BP 140/80   Pulse 80   Ht 5' 3"  (1.6 m)   Wt 192 lb 9.6 oz (87.4 kg)   BMI 34.12 kg/m  Constitutional: Pleasant,w female in no acute distress. HEENT: Normocephalic and atraumatic. Conjunctivae are normal. No scleral icterus. Neck supple.  Cardiovascular: Normal rate, regular rhythm.  Pulmonary/chest: Effort normal and breath sounds normal. No wheezing, rales or rhonchi. Abdominal: Soft, nondistended, nontender. There are no masses palpable. No hepatomegaly. Extremities: no edema Lymphadenopathy: No cervical adenopathy noted. Neurological: Alert and oriented to person place and time. Skin:  Skin is warm and dry. No rashes noted. Psychiatric: Normal mood and affect. Behavior is normal.   ASSESSMENT AND PLAN: 77 year old female here for reassessment following issues:  Suspected cirrhosis / esophageal varices - the varices were incidentally noted during recent EGD, which led to workup for liver disease. She does have steatosis the liver and changes concerning for possible cirrhosis on ultrasound / CT imaging. However her spleen and platelet count is normal. Overall given the findings of esophageal varices and imaging I suspect she does have portal hypertension with likely cirrhosis, but she is otherwise compensated at this time. She is on a low dose of propranolol to ensure she tolerated it, a bit hesitant to increase it further given her dizziness. I suspect fatty liver as a likely cause for this, she does not have any alcohol history and her labs are otherwise negative for other causes. We discussed what cirrhosis is an risks for decompensation and HCC moving forward. I will see her every 6 months in the clinic, plan on repeating ultrasound with AFP and labs in August. She is due for hepatitis A vaccine which we will give her today. She should avoid all NSAIDs and can use low-dose Tylenol as needed pains (<2 gm / day).   She agreed the plan, all questions answered  [place] Cellar, MD [facility]

## 2017-10-15 NOTE — Patient Instructions (Addendum)
If you are age 79 or older, your body mass index should be between 23-30. Your Body mass index is 34.12 kg/m. If this is out of the aforementioned range listed, please consider follow up with your Primary Care Provider.  If you are age 34 or younger, your body mass index should be between 19-25. Your Body mass index is 34.12 kg/m. If this is out of the aformentioned range listed, please consider follow up with your Primary Care Provider.   You have been scheduled for an abdominal ultrasound at [facility] (1st floor of hospital) on Monday, 12-15-2017 at 9:00am. Please arrive 15 minutes prior to your appointment for registration. Make certain not to have anything to eat or drink 6 hours prior to your appointment. Should you need to reschedule your appointment, please contact radiology at [phone]. This test typically takes about 30 minutes to perform.  Your provider has requested that you go to the basement level for lab work the fist week of August, the week of August 5th. They are open Monday through Friday from 7:30am to 5:00pm. You do not need an appointment and you do not have to be fasting.  We have given you a Hepatitis A vaccine today.  You will due for your 2nd injection in 6 months. We will call you and remind you to schedule that appointment when it is time.  We would like to see you back in the office in October.  We will contact you when it is time to schedule that appointment.   Thank you for entrusting me with your care and for choosing [facility], Dr. [place] Cellar

## 2017-10-30 DIAGNOSIS — Z85828 Personal history of other malignant neoplasm of skin: Secondary | ICD-10-CM | POA: Diagnosis not present

## 2017-10-30 DIAGNOSIS — L738 Other specified follicular disorders: Secondary | ICD-10-CM | POA: Diagnosis not present

## 2017-10-30 DIAGNOSIS — D1801 Hemangioma of skin and subcutaneous tissue: Secondary | ICD-10-CM | POA: Diagnosis not present

## 2017-10-30 DIAGNOSIS — L821 Other seborrheic keratosis: Secondary | ICD-10-CM | POA: Diagnosis not present

## 2017-10-30 DIAGNOSIS — L570 Actinic keratosis: Secondary | ICD-10-CM | POA: Diagnosis not present

## 2017-10-31 ENCOUNTER — Encounter: Payer: Self-pay | Admitting: Internal Medicine

## 2017-10-31 ENCOUNTER — Ambulatory Visit (INDEPENDENT_AMBULATORY_CARE_PROVIDER_SITE_OTHER): Payer: PPO | Admitting: Internal Medicine

## 2017-10-31 VITALS — BP 144/82 | HR 71 | Temp 98.2°F | Ht 63.0 in | Wt 195.0 lb

## 2017-10-31 DIAGNOSIS — J45909 Unspecified asthma, uncomplicated: Secondary | ICD-10-CM

## 2017-10-31 DIAGNOSIS — J3089 Other allergic rhinitis: Secondary | ICD-10-CM

## 2017-10-31 DIAGNOSIS — J302 Other seasonal allergic rhinitis: Secondary | ICD-10-CM | POA: Diagnosis not present

## 2017-10-31 MED ORDER — PREDNISONE 10 MG PO TABS: ORAL_TABLET | ORAL | 0 refills | Status: DC

## 2017-10-31 NOTE — Progress Notes (Signed)
Patient ID: [name], female   DOB: [birth year], 77 y.o.   MRN: [id number]   [facility] OFFICE  Provider: DR Arletha Grippe  Code Status:  Goals of Care:  Advanced Directives 08/13/2017  Does Patient Have a Medical Advance Directive? Yes  Type of Advance Directive Out of facility DNR (pink MOST or yellow form)  Does patient want to make changes to medical advance directive? No - Patient declined  Would patient like information on creating a medical advance directive? No - Patient declined  Pre-existing out of facility DNR order (yellow form or pink MOST form) Yellow form placed in chart (order not valid for inpatient use)     Chief Complaint  Patient presents with  . Acute Visit    Complains of Wheezing and cough. Worsened over 2 weeks. No relief with Advair or Nettipot    HPI: Patient is a 77 y.o. female seen today for an acute visit for wheezing and cough. Sx's began about 2 weeks ago but suddenly worsened this AM. Takes advair, albuterol HFA, zyrtec and flonase. She uses nettipot prn. She has hx seasonal allergies, ashma  Past Medical History:  Diagnosis Date  . ALLERGIC RHINITIS   . Anxiety   . Asthma   . Blood type O+   . Cervical strain, acute   . Cirrhosis ([place])   . Colon polyp 2010   adenoma  . Diverticulosis of colon   . Dizziness   . Esophageal varices ([place])   . Fibromyalgia   . GERD (gastroesophageal reflux disease)   . History of nephrolithiasis   . Hypercholesterolemia    borderline  . Hypertension   . IBS (irritable bowel syndrome)   . Osteoarthritis   . Personal history of allergy to unspecified medicinal agent   . Postconcussion syndrome   . Vitamin D deficiency     Past Surgical History:  Procedure Laterality Date  . CHOLECYSTECTOMY, LAPAROSCOPIC  2004   for gallstone pancreatitis  . KNEE SURGERY Right   . KNEE SURGERY Left   . THYROID SURGERY       reports that she quit smoking about 55 years ago. Her smoking use included cigarettes. She has  a 16.00 pack-year smoking history. She has never used smokeless tobacco. She reports that she does not drink alcohol or use drugs. Social History   Socioeconomic History  . Marital status: Divorced    Spouse name: Not on file  . Number of children: 1  . Years of education: Not on file  . Highest education level: Not on file  Occupational History  . Occupation: retired    Fish farm manager: [employer]    Comment: Presenter, broadcasting  Social Needs  . Financial resource strain: Not hard at all  . Food insecurity:    Worry: Never true    Inability: Never true  . Transportation needs:    Medical: No    Non-medical: No  Tobacco Use  . Smoking status: Former Smoker    Packs/day: 2.00    Years: 8.00    Pack years: 16.00    Types: Cigarettes    Last attempt to quit: 05/13/1962    Years since quitting: 55.5  . Smokeless tobacco: Never Used  Substance and Sexual Activity  . Alcohol use: No  . Drug use: No  . Sexual activity: Not Currently    Birth control/protection: Post-menopausal  Lifestyle  . Physical activity:    Days per week: 0 days    Minutes per  session: 0 min  . Stress: Rather much  Relationships  . Social connections:    Talks on phone: More than three times a week    Gets together: More than three times a week    Attends religious service: More than 4 times per year    Active member of club or organization: No    Attends meetings of clubs or organizations: Never    Relationship status: Divorced  . Intimate partner violence:    Fear of current or ex partner: No    Emotionally abused: No    Physically abused: No    Forced sexual activity: No  Other Topics Concern  . Not on file  Social History Narrative   exercises 3x a week   drinks 1 cup caffeine every other day    Family History  Problem Relation Age of Onset  . Breast cancer Mother   . Alzheimer's disease Mother   . Heart disease Mother   . COPD Brother   . Heart disease Sister   .  Breast cancer Sister   . Alcohol abuse Sister   . Ovarian cancer Paternal Grandmother   . Arthritis Other        Cousins   . COPD Cousin        Maternal side   . Heart attack Maternal Uncle     Allergies  Allergen Reactions  . Ace Inhibitors   . Amoxicillin Itching    REACTION: unknown? pain in right kidney  . Benicar Hct [Olmesartan Medoxomil-Hctz]     Extreme weakness  . Budesonide-Formoterol Fumarate     Causes tremors and numbness  . Chlorzoxazone [Chlorzoxazone]   . Citalopram Hydrobromide     REACTION: hives  . Citalopram Hydrobromide   . Clonidine Derivatives   . Droperidol     REACTION: hives  . Flexeril [Cyclobenzaprine Hcl]   . Fluconazole Nausea And Vomiting and Other (See Comments)    fatigue  . Ketorolac Tromethamine     REACTION: hives  . Ketorolac Tromethamine   . Metoclopramide Hcl   . Mometasone Furo-Formoterol Fum     Causes sore throat, blurred vision and unable to sleep--started on med 09-17-10  . Morphine     REACTION: hives and itching  . Olmesartan Medoxomil     REACTION: fatique  . Breo Ellipta [Fluticasone Furoate-Vilanterol] Itching    Pt reports itching with [place] is related to being lactose intolerant.     Outpatient Encounter Medications as of 10/31/2017  Medication Sig  . ADVAIR HFA 115-21 MCG/ACT inhaler Inhale 2 puffs into the lungs 2 (two) times daily.   Marland Kitchen albuterol (PROVENTIL HFA;VENTOLIN HFA) 108 (90 Base) MCG/ACT inhaler Inhale 2 puffs every 4 (four) hours as needed into the lungs for wheezing or shortness of breath. RESCUE  . aspirin 81 MG EC tablet Take 81 mg by mouth daily.    . Calcium Carbonate-Vitamin D (CALCIUM 600+D) 600-400 MG-UNIT tablet Take 1 tablet by mouth 2 (two) times daily.  . Cetirizine HCl (ZYRTEC ALLERGY) 10 MG CAPS Take 10 mg by mouth daily.   . Cholecalciferol (VITAMIN D3) 1000 UNITS CAPS Take 1,000 Units by mouth daily.   Marland Kitchen esomeprazole (NEXIUM) 40 MG capsule TAKE 1 CAPSULE BY MOUTH EVERY DAY  . fluticasone  (FLONASE) 50 MCG/ACT nasal spray SHAKE LIQUID AND USE ONE TO TWO SPRAYS IN EACH NOSTRIL TWICE DAILY  . hydrochlorothiazide (MICROZIDE) 12.5 MG capsule TAKE 1 CAPSULE BY MOUTH EVERY DAY  . meclizine (ANTIVERT) 25 MG tablet  TAKE 1 TABLET 3 TIMES A DAY AS NEEDED FOR DIZZINESS/NAUSEA  . Multiple Vitamins-Minerals (CENTRUM SILVER PO) Take 1 tablet by mouth daily.   . Omega-3 Fatty Acids (FISH OIL MAXIMUM STRENGTH) 1200 MG CAPS Take 1,200 mg by mouth 2 (two) times daily.   . potassium chloride SA (KLOR-CON M20) 20 MEQ tablet Take 1 tablet (20 mEq total) by mouth daily.  . propranolol (INDERAL) 10 MG tablet TAKE 1 TABLET BY MOUTH TWICE A DAY  . SALINE NASAL SPRAY NA Place 1 spray into both nostrils 2 (two) times daily.   Marland Kitchen amLODipine (NORVASC) 5 MG tablet TAKE 1 TABLET BY MOUTH EVERY DAY FOR BLOOD PRESSURE (Patient not taking: Reported on 10/15/2017)  . predniSONE (DELTASONE) 20 MG tablet TAKE AS DIRECTED (Patient not taking: Reported on 10/31/2017)  . Propylene Glycol (SYSTANE BALANCE) 0.6 % SOLN Place 1 drop into both ears 2 (two) times daily.    No facility-administered encounter medications on file as of 10/31/2017.     Review of Systems:  Review of Systems  HENT: Positive for congestion.   Respiratory: Positive for cough and wheezing.   All other systems reviewed and are negative.   Health Maintenance  Topic Date Due  . INFLUENZA VACCINE  12/11/2017  . COLONOSCOPY  06/25/2020  . TETANUS/TDAP  12/07/2023  . DEXA SCAN  Completed  . PNA vac Low Risk Adult  Completed    Physical Exam: Vitals:   10/31/17 1056  BP: (!) 144/82  Pulse: 71  Temp: 98.2 F (36.8 C)  TempSrc: Oral  SpO2: 97%  Weight: 195 lb (88.5 kg)  Height: 5' 3"  (1.6 m)   Body mass index is 34.54 kg/m. Physical Exam  Constitutional: She is oriented to person, place, and time. She appears well-developed and well-nourished.  Looks ill in NAD, no conversational dyspnea  HENT:  TMs appear nml; left maxillary sinus TTP  with boggy tissue texture changes; nares with enlarged grey dry turbinates; oropharynx cobblestoning and redness but no exudate  Neck: Neck supple.  Cardiovascular: Normal rate, regular rhythm and intact distal pulses. Exam reveals no gallop and no friction rub.  Murmur (1/6 SEM) heard. No edema b/l. No calf TTP  Pulmonary/Chest: No stridor. No respiratory distress. She has wheezes (end expiratory b/l). She has no rales.  Lymphadenopathy:    She has no cervical adenopathy.       Right: No supraclavicular adenopathy present.       Left: No supraclavicular adenopathy present.  Neurological: She is alert and oriented to person, place, and time.  Skin: Skin is warm and dry. No rash noted.  Psychiatric: She has a normal mood and affect. Her behavior is normal. Judgment and thought content normal.    Labs reviewed: Basic Metabolic Panel: Recent Labs    07/05/17 0917 07/06/17 0523 07/16/17 1436  NA 136 138 138  K 3.6 3.6 4.8  CL 100* 106 99  CO2 23 23 30   GLUCOSE 179* 113* 142*  BUN 26* 23* 12  CREATININE 0.85 0.67 0.88  CALCIUM 8.9 7.8* 10.1   Liver Function Tests: Recent Labs    12/31/16 0851 07/05/17 0917 07/16/17 1436  AST 23 53* 34  ALT 24 42 38*  ALKPHOS 98 71  --   BILITOT 0.6 0.9 0.8  PROT 6.7 6.4* 6.9  ALBUMIN 4.2 3.8  --    Recent Labs    07/05/17 0917 07/06/17 0523  LIPASE 274* 29   No results for input(s): AMMONIA in the last 8760  hours. CBC: Recent Labs    12/31/16 0851  07/05/17 0917 07/06/17 0523 07/16/17 1436  WBC 8.9   < > 20.2* 8.8 11.1*  NEUTROABS 4,005  --  17.7*  --  8,702*  HGB 14.4   < > 15.7* 12.9 14.7  HCT 42.7   < > 46.6* 39.6 41.6  MCV 85.9   < > 88.8 89.8 84.9  PLT 318   < > 257 232 381   < > = values in this interval not displayed.   Lipid Panel: Recent Labs    12/31/16 0851 07/16/17 1436  CHOL 206* 206*  HDL 54 47*  LDLCALC 125* 127*  TRIG 133 201*  CHOLHDL 3.8 4.4   Lab Results  Component Value Date   HGBA1C 5.9  (H) 07/16/2017    Procedures since last visit: Dg Bone Density  Addendum Date: 10/10/2017   ADDENDUM REPORT: 10/10/2017 15:40 EXAM: DUAL X-RAY ABSORPTIOMETRY (DXA) FOR BONE MINERAL DENSITY IMPRESSION: Referring Physician:  Lauree Chandler PATIENT: Name: [name], [name] Patient ID: [id number] Birth Date: 12-18-1940 Height: 62.0 in. Sex: Female Measured: 10/07/2017 Weight: 191.8 lbs. Indications: Advanced Age, Albuterol, Caucasian, Estrogen Deficient, Nexium, Pepcid, Postmenopausal, Protonix, Secondary Osteoporosis, Vitamin D Deficient Fractures: Finger Treatments: Calcium (E943.0), Vitamin D (E933.5) ASSESSMENT: The BMD measured at Femur Neck Right is 0.779 g/cm2 with a T-score of -1.9. This patient is considered osteopenic according to [facility]) criteria. There has been no statistically significant change in BMD of Lumbar spine, bilateral hips and Total Meansince prior exam dated 06/23/2015. Site Region Measured Date Measured Age YA BMD Significant CHANGE T-score AP Spine  L1-L4      10[age]07/2017    [age]         -1.1    1.062 g/cm2 AP Spine  L1-L4      06/23/2015    74.7         -0.9    1.089 g/cm2 DualFemur Neck Right 10/07/2017    77.0         -1.9    0.779 g/cm2 DualFemur Neck Right 06/23/2015    76.7         -1.7    0.807 g/cm2 DualFemur Total Mean 10/07/2017    77.0         -0.7    0.920 g/cm2 DualFemur Total Mean 06/23/2015    76.7         -0.8    0.902 g/cm2 World Health Organization [facility]) criteria for post-menopausal, Caucasian Women: Normal       T-score at or above -1 SD Osteopenia   T-score between -1 and -2.5 SD Osteoporosis T-score at or below -2.5 SD RECOMMENDATION: 1. All patients should optimize calcium and vitamin D intake. 2. Consider FDA approved medical therapies in postmenopausal women and men aged 20 years and older, based on the following: a. A hip or vertebral (clinical or morphometric) fracture b. T- score < or = -2.5 at the femoral neck or spine after  appropriate evaluation to exclude secondary causes c. Low bone mass (T-score between -1.0 and -2.5 at the femoral neck or spine) and a 10 year probability of a hip fracture > or = 3% or a 10 year probability of a major osteoporosis-related fracture > or = 20% based on the [place]-adapted WHO algorithm d. Clinician judgment and/or patient preferences may indicate treatment for people with 10-year fracture probabilities above or below these levels FOLLOW-UP: People with diagnosed cases of osteoporosis or at high risk for  fracture should have regular bone mineral density tests. For patients eligible for Medicare, routine testing is allowed once every 2 years. The testing frequency can be increased to one year for patients who have rapidly progressing disease, those who are receiving or discontinuing medical therapy to restore bone mass, or have additional risk factors. I have reviewed this report and agree with the above findings. [facility] FRAX* 10-year Probability of Fracture Based on femoral neck BMD: DualFemur (Right) Major Osteoporotic Fracture: 13.6% Hip Fracture:                3.6% Population:                  [place] (Caucasian) Risk Factors:                Secondary Osteoporosis *FRAX is a Materials engineer of the [employer] of [employer] for Metabolic Bone Disease, a [place] (WHO) [facility]. ASSESSMENT: The probability of a major osteoporotic fracture is 13.6 % within the next ten years. The probability of hip fracture is 3.6  % within the next 10 years. Electronically Signed   By: Nolon Nations M.D.   On: 10/10/2017 15:40   Result Date: 10/10/2017 EXAM: DUAL X-RAY ABSORPTIOMETRY (DXA) FOR BONE MINERAL DENSITY IMPRESSION: Referring Physician:  Lauree Chandler PATIENT: Name: [name], [name] Patient ID: [id number] Birth Date: 1940-05-27 Height: 62.0 in. Sex: Female Measured: 10/07/2017 Weight: 191.8 lbs. Indications: Advanced Age, Albuterol, Caucasian,  Estrogen Deficient, Nexium, Pepcid, Postmenopausal, Protonix, Secondary Osteoporosis, Vitamin D Deficient Fractures: Finger Treatments: Calcium (E943.0), Vitamin D (E933.5) ASSESSMENT: The BMD measured at Femur Neck Right is 0.779 g/cm2 with a T-score of -1.9. This patient is considered osteopenic according to [facility]) criteria. Lumbar spine results are not valid due to significant lumbar scoliosis and degenerative changes. There has been no statistically significant change in BMD of Lumbar spine, Dual Femur hips, or Total Mean hips since prior exam dated 06/23/2015. Site Region Measured Date Measured Age YA BMD Significant CHANGE T-score AP Spine  L1-L4      8/07[age]2017    [age]          [age]     1.062 g/cm2 AP Spine  L1-L4      06/23/2015    0.9          -       1.089 g/cm2 DualFemur Neck Right 10/07/2017    0.2          3.3     0.779 g/cm2 DualFemur Neck Right 06/23/2015    0.3          -       0.807 g/cm2 DualFemur Total Mean 10/07/2017    1.3          2.2     0.920 g/cm2 DualFemur Total Mean 06/23/2015    1.0          -       0.902 g/cm2 World Health Organization [facility]) criteria for post-menopausal, Caucasian Women: Normal       T-score at or above -1 SD Osteopenia   T-score between -1 and -2.5 SD Osteoporosis T-score at or below -2.5 SD RECOMMENDATION: 1. All patients should optimize calcium and vitamin D intake. 2. Consider FDA approved medical therapies in postmenopausal women and men aged 29 years and older, based on the following: a. A hip or vertebral (clinical or morphometric) fracture b. T- score < or = -2.5 at the  femoral neck or spine after appropriate evaluation to exclude secondary causes c. Low bone mass (T-score between -1.0 and -2.5 at the femoral neck or spine) and a 10 year probability of a hip fracture > or = 3% or a 10 year probability of a major osteoporosis-related fracture > or = 20% based on the [place]-adapted WHO algorithm d. Clinician judgment and/or patient preferences  may indicate treatment for people with 10-year fracture probabilities above or below these levels FOLLOW-UP: People with diagnosed cases of osteoporosis or at high risk for fracture should have regular bone mineral density tests. For patients eligible for Medicare, routine testing is allowed once every 2 years. The testing frequency can be increased to one year for patients who have rapidly progressing disease, those who are receiving or discontinuing medical therapy to restore bone mass, or have additional risk factors. FRAX* 10-year Probability of Fracture Based on femoral neck BMD: DualFemur (Right) Major Osteoporotic Fracture: 13.6% Hip Fracture:                3.6% Population:                  [place] (Caucasian) Risk Factors:                Secondary Osteoporosis *FRAX is a [place] [employer] for Metabolic Bone Disease, a [place] (WHO) [facility]. ASSESSMENT: The probability of a major osteoporotic fracture is 13.6 % within the next ten years. The probability of hip fracture is  3.6  % within the next 10 years. Electronically Signed: By: Earle Gell M.D. On: 10/07/2017 09:29    Assessment/Plan   ICD-10-CM   1. Asthmatic bronchitis without complication, unspecified asthma severity, unspecified whether persistent J45.909 predniSONE (DELTASONE) 10 MG tablet  2. Seasonal and perennial allergic rhinitis J30.89 predniSONE (DELTASONE) 10 MG tablet   J30.2    START PREDNISONE TAPER AS DIRECTED (40-->30-->20-->10)  Push fluids and rest  Continue other medications as ordered  Follow up as scheduled with Janett Billow or sooner if need be  [employer]. Perlie Gold  [facility] [address] [place], [place] [address] [phone] Cell (Monday-Friday 8 AM - 5 PM) [phone] After 5 PM and follow prompts

## 2017-10-31 NOTE — Patient Instructions (Addendum)
START PREDNISONE TAPER AS DIRECTED  Push fluids and rest  Continue other medications as ordered  Follow up as scheduled with [name] or sooner if need be   Acute Bronchitis, Adult Acute bronchitis is when air tubes (bronchi) in the lungs suddenly get swollen. The condition can make it hard to breathe. It can also cause these symptoms:  A cough.  Coughing up clear, yellow, or green mucus.  Wheezing.  Chest congestion.  Shortness of breath.  A fever.  Body aches.  Chills.  A sore throat.  Follow these instructions at home: Medicines  Take over-the-counter and prescription medicines only as told by your doctor.  If you were prescribed an antibiotic medicine, take it as told by your doctor. Do not stop taking the antibiotic even if you start to feel better. General instructions  Rest.  Drink enough fluids to keep your pee (urine) clear or pale yellow.  Avoid smoking and secondhand smoke. If you smoke and you need help quitting, ask your doctor. Quitting will help your lungs heal faster.  Use an inhaler, cool mist vaporizer, or humidifier as told by your doctor.  Keep all follow-up visits as told by your doctor. This is important. How is this prevented? To lower your risk of getting this condition again:  Wash your hands often with soap and water. If you cannot use soap and water, use hand sanitizer.  Avoid contact with people who have cold symptoms.  Try not to touch your hands to your mouth, nose, or eyes.  Make sure to get the flu shot every year.  Contact a doctor if:  Your symptoms do not get better in 2 weeks. Get help right away if:  You cough up blood.  You have chest pain.  You have very bad shortness of breath.  You become dehydrated.  You faint (pass out) or keep feeling like you are going to pass out.  You keep throwing up (vomiting).  You have a very bad headache.  Your fever or chills gets worse. This information is not intended  to replace advice given to you by your health care provider. Make sure you discuss any questions you have with your health care provider. Document Released: 10/16/2007 Document Revised: 12/06/2015 Document Reviewed: 10/18/2015 Elsevier Interactive Patient Education  [employer].

## 2017-11-17 ENCOUNTER — Other Ambulatory Visit: Payer: Self-pay | Admitting: Nurse Practitioner

## 2017-11-18 ENCOUNTER — Other Ambulatory Visit: Payer: Self-pay | Admitting: Nurse Practitioner

## 2017-11-18 DIAGNOSIS — K219 Gastro-esophageal reflux disease without esophagitis: Secondary | ICD-10-CM

## 2017-11-24 ENCOUNTER — Telehealth: Payer: Self-pay

## 2017-11-24 NOTE — Telephone Encounter (Signed)
Agree with scheduling next available appt.

## 2017-11-24 NOTE — Telephone Encounter (Signed)
Patient called complaining of cold/upper respiratory/sinus symptoms  Patient with laryngitis, sore throat, runny nose, and headache. Patient states " I have not had a sore throat like this since I was a child."   1. Fever? No 2. Chills? No 3. Myalgias? No 4. How long have you had your symptoms? Onset Friday, 11/21/17 5. Are you coughing up mucus and if yes any discoloration? Very little cough, no mucus  6. What have you done for your symptoms thus far? Allergy medication, ASA, Tylenol, heating pad, gargle with salt water 7. Have you been around anyone sick? Yes  No available appointment today, next available Wednesday 11/26/17.    Patient aware I will forward response to your provider Sherrie Mustache is out of office, will send to covering provider) and call you with further instructions, please advise.

## 2017-11-24 NOTE — Telephone Encounter (Signed)
Ok to continue conservative measure she's doing unless fever develops or she has difficulty eating and drinking.  In those instances, she will need to be seen in the ED

## 2017-11-24 NOTE — Telephone Encounter (Signed)
Spoke with patient, patient verbalized understanding of recommendations.   Patient states she is also using a Netipot.  Patient then mentioned she would like to schedule for next available appointment related to some severe continuous pain in lower back/spine and left hip. Patient rates this pain 9/10. Patient informed next available appointment is next Tuesday 12/02/17 with Dr.Hopper. Patient scheduled appointment and states she has dealt with pain for a while. Patient aware I will forward to Dr.Reed as a FYI.  Patient informed if symptoms progress related to the back, spine, and hip pain to seek medical attention in the ER as well as if she experience any symptoms listed in Dr.Reed's response.

## 2017-11-27 ENCOUNTER — Ambulatory Visit (INDEPENDENT_AMBULATORY_CARE_PROVIDER_SITE_OTHER): Payer: PPO | Admitting: Internal Medicine

## 2017-11-27 ENCOUNTER — Encounter: Payer: Self-pay | Admitting: Internal Medicine

## 2017-11-27 VITALS — BP 138/78 | HR 78 | Temp 98.1°F | Ht 63.0 in | Wt 194.0 lb

## 2017-11-27 DIAGNOSIS — J0100 Acute maxillary sinusitis, unspecified: Secondary | ICD-10-CM

## 2017-11-27 DIAGNOSIS — H9203 Otalgia, bilateral: Secondary | ICD-10-CM

## 2017-11-27 DIAGNOSIS — M545 Low back pain, unspecified: Secondary | ICD-10-CM

## 2017-11-27 DIAGNOSIS — G8929 Other chronic pain: Secondary | ICD-10-CM

## 2017-11-27 MED ORDER — AZITHROMYCIN 250 MG PO TABS: ORAL_TABLET | ORAL | 0 refills | Status: DC

## 2017-11-27 MED ORDER — PROPYLENE GLYCOL 0.6 % OP SOLN: 1.0000 [drp] | Freq: Two times a day (BID) | OPHTHALMIC | 0 refills | Status: DC

## 2017-11-27 NOTE — Progress Notes (Signed)
Location:  [facility] Provider:  Caysen Whang L. Mariea Clonts, D.O., C.M.D.  Code Status: DNR Goals of Care:  Advanced Directives 11/27/2017  Does Patient Have a Medical Advance Directive? Yes  Type of Advance Directive Out of facility DNR (pink MOST or yellow form)  Does patient want to make changes to medical advance directive? No - Patient declined  Would patient like information on creating a medical advance directive? -  Pre-existing out of facility DNR order (yellow form or pink MOST form) Yellow form placed in chart (order not valid for inpatient use)     Chief Complaint  Patient presents with  . Medical Management of Chronic Issues    follow-up for hip and back pain     HPI: Patient is a 77 y.o. female pt of [name]'s seen today for hip and back pain and new cold.  Also doctor visit   Pt called on 11/24/17 and requested an appointment due to "some severe continuous pain in lower back/spine and left hip. Patient rates this pain 9/10."  Thinks it's arthritis.  It does ease up after walking around.  Hates to get up and bothers her if she sits with her feet dangling.  Using tylenol for pain.  Has arthritis in her lower spine and had pain in that left hip for years.  Uses a cane.  Feels like she'll go to the right with her balance.  If stands or lays too long it's 9/10.    Also has a cold.  Ears have been hurting real bad.  She's not been using ear drops but wants them again.  started with bad sore, throat, headache, feeling bad and friend had it on Wednesday so got it.  Using her nettipot.    Diagnosed with nonalcoholic cirrhoses of the liver.  Thinks her eyes are not as white but they look ok to me.    Has also been to knee doctor for bone on bone arthritis.    Past Medical History:  Diagnosis Date  . ALLERGIC RHINITIS   . Anxiety   . Asthma   . Blood type O+   . Cervical strain, acute   . Cirrhosis ([place])   . Colon polyp 2010   adenoma  . Diverticulosis of colon   . Dizziness   .  Esophageal varices ([place])   . Fibromyalgia   . GERD (gastroesophageal reflux disease)   . History of nephrolithiasis   . Hypercholesterolemia    borderline  . Hypertension   . IBS (irritable bowel syndrome)   . Osteoarthritis   . Personal history of allergy to unspecified medicinal agent   . Postconcussion syndrome   . Vitamin D deficiency     Past Surgical History:  Procedure Laterality Date  . CHOLECYSTECTOMY, LAPAROSCOPIC  2004   for gallstone pancreatitis  . KNEE SURGERY Right   . KNEE SURGERY Left   . THYROID SURGERY      Allergies  Allergen Reactions  . Ace Inhibitors   . Amoxicillin Itching    REACTION: unknown? pain in right kidney  . Benicar Hct [Olmesartan Medoxomil-Hctz]     Extreme weakness  . Budesonide-Formoterol Fumarate     Causes tremors and numbness  . Chlorzoxazone [Chlorzoxazone]   . Citalopram Hydrobromide     REACTION: hives  . Citalopram Hydrobromide   . Clonidine Derivatives   . Droperidol     REACTION: hives  . Flexeril [Cyclobenzaprine Hcl]   . Fluconazole Nausea And Vomiting and Other (See Comments)  fatigue  . Ketorolac Tromethamine     REACTION: hives  . Ketorolac Tromethamine   . Metoclopramide Hcl   . Mometasone Furo-Formoterol Fum     Causes sore throat, blurred vision and unable to sleep--started on med 09-17-10  . Morphine     REACTION: hives and itching  . Olmesartan Medoxomil     REACTION: fatique  . Breo Ellipta [Fluticasone Furoate-Vilanterol] Itching    Pt reports itching with Memory Dance is related to being lactose intolerant.     Outpatient Encounter Medications as of 11/27/2017  Medication Sig  . ADVAIR HFA 115-21 MCG/ACT inhaler Inhale 2 puffs into the lungs 2 (two) times daily.   Marland Kitchen albuterol (PROVENTIL HFA;VENTOLIN HFA) 108 (90 Base) MCG/ACT inhaler Inhale 2 puffs every 4 (four) hours as needed into the lungs for wheezing or shortness of breath. RESCUE  . aspirin 81 MG EC tablet Take 81 mg by mouth daily.    . Calcium  Carbonate-Vitamin D (CALCIUM 600+D) 600-400 MG-UNIT tablet Take 1 tablet by mouth 2 (two) times daily.  . Cetirizine HCl (ZYRTEC ALLERGY) 10 MG CAPS Take 10 mg by mouth daily.   . Cholecalciferol (VITAMIN D3) 1000 UNITS CAPS Take 1,000 Units by mouth daily.   Marland Kitchen esomeprazole (NEXIUM) 40 MG capsule TAKE 1 CAPSULE BY MOUTH EVERY DAY  . fluticasone (FLONASE) 50 MCG/ACT nasal spray SHAKE LIQUID AND USE ONE TO TWO SPRAYS IN EACH NOSTRIL TWICE DAILY  . hydrochlorothiazide (MICROZIDE) 12.5 MG capsule TAKE 1 CAPSULE BY MOUTH EVERY DAY  . KLOR-CON M20 20 MEQ tablet TAKE 1 TABLET BY MOUTH EVERY DAY  . meclizine (ANTIVERT) 25 MG tablet TAKE 1 TABLET 3 TIMES A DAY AS NEEDED FOR DIZZINESS/NAUSEA  . Multiple Vitamins-Minerals (CENTRUM SILVER PO) Take 1 tablet by mouth daily.   . Omega-3 Fatty Acids (FISH OIL MAXIMUM STRENGTH) 1200 MG CAPS Take 1,200 mg by mouth 2 (two) times daily.   . propranolol (INDERAL) 10 MG tablet TAKE 1 TABLET BY MOUTH TWICE A DAY  . Propylene Glycol (SYSTANE BALANCE) 0.6 % SOLN Place 1 drop into both ears 2 (two) times daily.   Marland Kitchen SALINE NASAL SPRAY NA Place 1 spray into both nostrils 2 (two) times daily.   . [DISCONTINUED] amLODipine (NORVASC) 5 MG tablet TAKE 1 TABLET BY MOUTH EVERY DAY FOR BLOOD PRESSURE (Patient not taking: Reported on 10/15/2017)   No facility-administered encounter medications on file as of 11/27/2017.     Review of Systems:  Review of Systems  Constitutional: Negative for chills, fever and malaise/fatigue.  HENT: Positive for congestion, ear pain and sore throat. Negative for hearing loss.   Eyes: Negative for blurred vision.  Respiratory: Positive for cough and shortness of breath. Negative for wheezing.   Cardiovascular: Negative for chest pain, palpitations and PND.  Gastrointestinal: Negative for abdominal pain.  Genitourinary: Negative for dysuria.  Musculoskeletal: Positive for back pain and joint pain. Negative for falls.  Neurological: Negative for  dizziness and loss of consciousness.  Psychiatric/Behavioral: Negative for memory loss.    Health Maintenance  Topic Date Due  . INFLUENZA VACCINE  12/11/2017  . COLONOSCOPY  06/25/2020  . TETANUS/TDAP  12/07/2023  . DEXA SCAN  Completed  . PNA vac Low Risk Adult  Completed    Physical Exam: Vitals:   11/27/17 1428  BP: 138/78  Pulse: 78  Temp: 98.1 F (36.7 C)  TempSrc: Oral  SpO2: 95%  Weight: 194 lb (88 kg)  Height: 5' 3"  (1.6 m)   Body mass  index is 34.37 kg/m. Physical Exam  Constitutional: She is oriented to person, place, and time. She appears well-developed and well-nourished. No distress.  HENT:  Head: Normocephalic and atraumatic.  Dull tms, tender sinuses, erythema of throat  Eyes:  No scleral icterus  Cardiovascular: Normal rate, regular rhythm, normal heart sounds and intact distal pulses.  Pulmonary/Chest: Effort normal and breath sounds normal. She has no wheezes.  Abdominal: Bowel sounds are normal.  Neurological: She is alert and oriented to person, place, and time.  Skin: Skin is warm and dry.  Psychiatric: She has a normal mood and affect.  Talks fast, anxious    Labs reviewed: Basic Metabolic Panel: Recent Labs    07/05/17 0917 07/06/17 0523 07/16/17 1436  NA 136 138 138  K 3.6 3.6 4.8  CL 100* 106 99  CO2 23 23 30   GLUCOSE 179* 113* 142*  BUN 26* 23* 12  CREATININE 0.85 0.67 0.88  CALCIUM 8.9 7.8* 10.1   Liver Function Tests: Recent Labs    12/31/16 0851 07/05/17 0917 07/16/17 1436  AST 23 53* 34  ALT 24 42 38*  ALKPHOS 98 71  --   BILITOT 0.6 0.9 0.8  PROT 6.7 6.4* 6.9  ALBUMIN 4.2 3.8  --    Recent Labs    07/05/17 0917 07/06/17 0523  LIPASE 274* 29   No results for input(s): AMMONIA in the last 8760 hours. CBC: Recent Labs    12/31/16 0851  07/05/17 0917 07/06/17 0523 07/16/17 1436  WBC 8.9   < > 20.2* 8.8 11.1*  NEUTROABS 4,005  --  17.7*  --  8,702*  HGB 14.4   < > 15.7* 12.9 14.7  HCT 42.7   < > 46.6*  39.6 41.6  MCV 85.9   < > 88.8 89.8 84.9  PLT 318   < > 257 232 381   < > = values in this interval not displayed.   Lipid Panel: Recent Labs    12/31/16 0851 07/16/17 1436  CHOL 206* 206*  HDL 54 47*  LDLCALC 125* 127*  TRIG 133 201*  CHOLHDL 3.8 4.4   Lab Results  Component Value Date   HGBA1C 5.9 (H) 07/16/2017    Assessment/Plan 1. Otalgia of both ears - Propylene Glycol (SYSTANE BALANCE) 0.6 % SOLN; Place 1 drop into both ears 2 (two) times daily.  Dispense: 15 mL; Refill: 0 - azithromycin (ZITHROMAX) 250 MG tablet; 2 tabs today, then one tab daily for 4 days  Dispense: 6 tablet; Refill: 0  2. Subacute maxillary sinusitis - push fluids, rest - azithromycin (ZITHROMAX) 250 MG tablet; 2 tabs today, then one tab daily for 4 days  Dispense: 6 tablet; Refill: 0--tx due to her COPD  3. Chronic left-sided low back pain without sciatica -continue tylenol -she plans to f/u with orthopedics  Labs/tests ordered:  No orders of the defined types were placed in this encounter.  Next appt:  03/30/2018 with Janett Billow for med mgt; and prn   Delonda Coley L. Mairany Bruno, D.O. [facility] [address], [place] [address] Cell Phone (Mon-Fri 8am-5pm):  [phone] On Call:  [phone] & follow prompts after 5pm & weekends Office Phone:  [phone] Office Fax:  [phone]

## 2017-12-02 ENCOUNTER — Ambulatory Visit: Payer: PPO | Admitting: Internal Medicine

## 2017-12-15 ENCOUNTER — Telehealth: Payer: Self-pay | Admitting: *Deleted

## 2017-12-15 ENCOUNTER — Ambulatory Visit (HOSPITAL_COMMUNITY)
Admission: RE | Admit: 2017-12-15 | Discharge: 2017-12-15 | Disposition: A | Discharge status: Home / Self Care | Payer: PPO | Source: Ambulatory Visit | Attending: Gastroenterology | Admitting: Gastroenterology

## 2017-12-15 DIAGNOSIS — Z9049 Acquired absence of other specified parts of digestive tract: Secondary | ICD-10-CM | POA: Diagnosis not present

## 2017-12-15 DIAGNOSIS — I8500 Esophageal varices without bleeding: Secondary | ICD-10-CM | POA: Diagnosis not present

## 2017-12-15 DIAGNOSIS — K7460 Unspecified cirrhosis of liver: Secondary | ICD-10-CM | POA: Insufficient documentation

## 2017-12-15 DIAGNOSIS — K7689 Other specified diseases of liver: Secondary | ICD-10-CM | POA: Diagnosis not present

## 2017-12-15 NOTE — Telephone Encounter (Signed)
Patient called requesting Handicap Placard to be filled out. Is this ok to fill out and place in your folder for signature. Please Advise.   Patient wants a call once ready for pick up

## 2017-12-15 NOTE — Telephone Encounter (Signed)
It is ok to fill in the form, but since I don't know her myself, you may need to ask her which boxes are appropriate to check on the form so it's filled in properly.  It will be temporary also since I don't know her myself.

## 2017-12-17 NOTE — Telephone Encounter (Signed)
Patient stated that she has a hard time walking for period of time due to a lot of pain.   Handicap Placard filled out and placed in Dr. Cyndi Lennert folder for review and signature.

## 2017-12-18 ENCOUNTER — Other Ambulatory Visit (INDEPENDENT_AMBULATORY_CARE_PROVIDER_SITE_OTHER): Payer: PPO

## 2017-12-18 DIAGNOSIS — I8500 Esophageal varices without bleeding: Secondary | ICD-10-CM

## 2017-12-18 DIAGNOSIS — K7460 Unspecified cirrhosis of liver: Secondary | ICD-10-CM

## 2017-12-18 LAB — CBC WITH DIFFERENTIAL/PLATELET
BASOS ABS: 0.1 10*3/uL (ref 0.0–0.1)
Basophils Relative: 0.8 % (ref 0.0–3.0)
EOS ABS: 0.1 10*3/uL (ref 0.0–0.7)
Eosinophils Relative: 1.6 % (ref 0.0–5.0)
HCT: 43.7 % (ref 36.0–46.0)
Hemoglobin: 14.8 g/dL (ref 12.0–15.0)
Lymphocytes Relative: 46.6 % — ABNORMAL HIGH (ref 12.0–46.0)
Lymphs Abs: 3.9 10*3/uL (ref 0.7–4.0)
MCHC: 33.9 g/dL (ref 30.0–36.0)
MCV: 84.8 fl (ref 78.0–100.0)
MONO ABS: 0.8 10*3/uL (ref 0.1–1.0)
Monocytes Relative: 9.3 % (ref 3.0–12.0)
NEUTROS PCT: 41.7 % — AB (ref 43.0–77.0)
Neutro Abs: 3.4 10*3/uL (ref 1.4–7.7)
Platelets: 240 10*3/uL (ref 150.0–400.0)
RBC: 5.15 Mil/uL — AB (ref 3.87–5.11)
RDW: 13.3 % (ref 11.5–15.5)
WBC: 8.3 10*3/uL (ref 4.0–10.5)

## 2017-12-18 LAB — COMPREHENSIVE METABOLIC PANEL
ALBUMIN: 4 g/dL (ref 3.5–5.2)
ALK PHOS: 102 U/L (ref 39–117)
ALT: 33 U/L (ref 0–35)
AST: 29 U/L (ref 0–37)
BILIRUBIN TOTAL: 0.5 mg/dL (ref 0.2–1.2)
BUN: 14 mg/dL (ref 6–23)
CO2: 31 mEq/L (ref 19–32)
CREATININE: 0.7 mg/dL (ref 0.40–1.20)
Calcium: 9.8 mg/dL (ref 8.4–10.5)
Chloride: 102 mEq/L (ref 96–112)
GFR: 85.74 mL/min (ref >60.00)
Glucose, Bld: 149 mg/dL — ABNORMAL HIGH (ref 70–99)
Potassium: 4.2 mEq/L (ref 3.5–5.1)
Sodium: 139 mEq/L (ref 135–145)
TOTAL PROTEIN: 6.7 g/dL (ref 6.0–8.3)

## 2017-12-18 LAB — PROTIME-INR
INR: 0.9 ratio (ref 0.8–1.0)
Prothrombin Time: 10.8 s (ref 9.6–13.1)

## 2017-12-19 ENCOUNTER — Other Ambulatory Visit: Payer: Self-pay | Admitting: Nurse Practitioner

## 2017-12-19 LAB — AFP TUMOR MARKER: AFP-Tumor Marker: 3.5 ng/mL

## 2018-01-01 ENCOUNTER — Telehealth: Payer: Self-pay | Admitting: Gastroenterology

## 2018-01-01 ENCOUNTER — Other Ambulatory Visit: Payer: Self-pay | Admitting: Nurse Practitioner

## 2018-01-01 NOTE — Telephone Encounter (Signed)
Okay. She did not tolerate propranolol / nonselective beta blocker. We may have to survey her varices at some point in time with EGD again in the future. She should follow up with her PCP for these nonspecific symptoms otherwise, not sure if related to norvasc or something else. I can see her for routine follow up with [place] in December, 6 months from our last visit. Thanks

## 2018-01-01 NOTE — Telephone Encounter (Signed)
Patient wants to let Dr.Armbruster know that the medication for high blood pressure that he suggested did not work and that she went back to old medication?Marland Kitchen.Not quite sure what patient was speaking of, but she would also like a call from the nurse at her convenience.

## 2018-01-01 NOTE — Telephone Encounter (Signed)
Patient stopped her propanolol as she states her symptoms were worsening. She went back to taking her Norvasc once a day. She states she is bothered by not sleeping well, weakness, fatigue. Let her know I would send a message to Dr. Havery Moros.

## 2018-01-02 ENCOUNTER — Other Ambulatory Visit: Payer: Self-pay | Admitting: Nurse Practitioner

## 2018-01-02 DIAGNOSIS — I8500 Esophageal varices without bleeding: Secondary | ICD-10-CM

## 2018-01-02 NOTE — Telephone Encounter (Signed)
Spoke to patient let her know to contact her PCP about other symptoms. I have placed a recall in our system hopefully to schedule a 6 month follow up visit after Dec 6 so she can have a visit and get her hepatitis injection.

## 2018-01-23 ENCOUNTER — Telehealth: Payer: Self-pay | Admitting: Gastroenterology

## 2018-01-23 NOTE — Telephone Encounter (Signed)
Talked to [name]. She sates she wants to change medications from propanolol 76m bid back to amlodipine 5 mg once a day. She is not tolerating the propanolol. Please advise.

## 2018-01-25 NOTE — Telephone Encounter (Signed)
Vivien Rota if she wishes to stop the propranolol if she is not tolerating even a low dose, that is fine. Her PCP should be managing her HTN regimen however, she should touch base with her PCP to determine which regimen she should be on for that issue. Otherwise, she should have a follow up visit with me to discuss a surveillance EGD, if you can help coordinate a routine office visit. She may warrant banding of the varices if she is not able to tolerate the propranolol. Thanks

## 2018-01-26 ENCOUNTER — Telehealth: Payer: Self-pay | Admitting: Gastroenterology

## 2018-01-26 NOTE — Telephone Encounter (Signed)
Pt returned Toni's call. Explained to her that Dr. Havery Moros would like her to contact her PCP regarding managing her HTN medication.  She expressed understanding.  Also, she was agreeable to schedule an appt with Dr. Loni Muse to discuss EGD surveillance of varices.  She is scheduled for 02-25-18.

## 2018-01-26 NOTE — Telephone Encounter (Signed)
See phone note from 01-23-18.

## 2018-01-29 ENCOUNTER — Other Ambulatory Visit: Payer: Self-pay | Admitting: Nurse Practitioner

## 2018-01-29 DIAGNOSIS — Z1231 Encounter for screening mammogram for malignant neoplasm of breast: Secondary | ICD-10-CM

## 2018-02-06 ENCOUNTER — Other Ambulatory Visit: Payer: Self-pay | Admitting: Nurse Practitioner

## 2018-02-06 ENCOUNTER — Telehealth: Payer: Self-pay | Admitting: *Deleted

## 2018-02-06 DIAGNOSIS — R234 Changes in skin texture: Secondary | ICD-10-CM

## 2018-02-06 NOTE — Telephone Encounter (Signed)
[place] with the [facility] called and stated someone had called her requesting a Dx to use for a Diagnostic Mammogram and Ultrasound due to Breast Itching. She stated to use: R23.4 - Breast skin changes.   Called patient and she stated that she wants the order placed. Order placed and confirmed appointment with patient here 02/10/18

## 2018-02-09 ENCOUNTER — Other Ambulatory Visit: Payer: Self-pay | Admitting: Nurse Practitioner

## 2018-02-09 DIAGNOSIS — R234 Changes in skin texture: Secondary | ICD-10-CM

## 2018-02-10 ENCOUNTER — Encounter: Payer: Self-pay | Admitting: Nurse Practitioner

## 2018-02-10 ENCOUNTER — Ambulatory Visit (INDEPENDENT_AMBULATORY_CARE_PROVIDER_SITE_OTHER): Payer: PPO | Admitting: Nurse Practitioner

## 2018-02-10 VITALS — BP 140/82 | HR 87 | Temp 98.0°F | Ht 63.0 in | Wt 200.4 lb

## 2018-02-10 DIAGNOSIS — K219 Gastro-esophageal reflux disease without esophagitis: Secondary | ICD-10-CM

## 2018-02-10 DIAGNOSIS — J45909 Unspecified asthma, uncomplicated: Secondary | ICD-10-CM | POA: Diagnosis not present

## 2018-02-10 DIAGNOSIS — R350 Frequency of micturition: Secondary | ICD-10-CM

## 2018-02-10 DIAGNOSIS — M545 Low back pain: Secondary | ICD-10-CM | POA: Diagnosis not present

## 2018-02-10 DIAGNOSIS — L84 Corns and callosities: Secondary | ICD-10-CM

## 2018-02-10 DIAGNOSIS — G8929 Other chronic pain: Secondary | ICD-10-CM | POA: Diagnosis not present

## 2018-02-10 LAB — POCT URINALYSIS DIPSTICK
BILIRUBIN UA: NEGATIVE
GLUCOSE UA: NEGATIVE
KETONES UA: NEGATIVE
Nitrite, UA: NEGATIVE
Odor: NORMAL
Protein, UA: POSITIVE — AB
RBC UA: NEGATIVE
Spec Grav, UA: 1.02 (ref 1.010–1.025)
Urobilinogen, UA: NEGATIVE E.U./dL — AB
pH, UA: 7.5 (ref 5.0–8.0)

## 2018-02-10 MED ORDER — ESOMEPRAZOLE MAGNESIUM 40 MG PO CPDR: 40.0000 mg | DELAYED_RELEASE_CAPSULE | Freq: Every day | ORAL | 6 refills | Status: DC

## 2018-02-10 NOTE — Progress Notes (Signed)
Careteam: Patient Care Team: Lauree Chandler, NP as PCP - General (Nurse Practitioner)  Advanced Directive information    Allergies  Allergen Reactions  . Ace Inhibitors   . Amoxicillin Itching    REACTION: unknown? pain in right kidney  . Benicar Hct [Olmesartan Medoxomil-Hctz]     Extreme weakness  . Budesonide-Formoterol Fumarate     Causes tremors and numbness  . Chlorzoxazone [Chlorzoxazone]   . Citalopram Hydrobromide     REACTION: hives  . Citalopram Hydrobromide   . Clonidine Derivatives   . Droperidol     REACTION: hives  . Flexeril [Cyclobenzaprine Hcl]   . Fluconazole Nausea And Vomiting and Other (See Comments)    fatigue  . Ketorolac Tromethamine     REACTION: hives  . Ketorolac Tromethamine   . Metoclopramide Hcl   . Mometasone Furo-Formoterol Fum     Causes sore throat, blurred vision and unable to sleep--started on med 09-17-10  . Morphine     REACTION: hives and itching  . Olmesartan Medoxomil     REACTION: fatique  . Breo Ellipta [Fluticasone Furoate-Vilanterol] Itching    Pt reports itching with Memory Dance is related to being lactose intolerant.     Chief Complaint  Patient presents with  . Acute Visit    Bronchitis - several problems      HPI: Patient is a 77 y.o. female seen in the office today due to pain in back and hip.  Reports pain 9/10 in her lower back and hip for about 4 months. Lower back/hip has been for years. Has taken tylenol at times which helps. Brings pain down to 4/10.  Does not hurt all the time.  Worse when she is doing the dishes or when she first gets up in the morning. Having some dizziness which also altered gaits.  Reports fatigue.  Sedentary lifestyle. Stays in the recline most the day. Occasionally will get out and has appt and meetings. Attempted to walk in the hallway at her apartment but did not keep this up.  No falls or injury.  Reports some cramps in her lower legs and states legs will feel numb but no pain  running down the legs.  Able to drive.  Following with GI due to cirrhosis of the liver. Not taking propranolol and taking amlodipine (which is not on her medication list)   Reports yellowish green urine, increase frequency- not necessarily new. No pain with urination or abdominal discomfort.   Reports she is wheezing some when she lays down-using albuterol mid-day which helps this.  Pt with hx of asthma and allergies. Taking advair, albuterol hfa pRN, zyrtec and flonase. Has quit following with pulmonary- last OV was 04/07/17 and was recommended to follow up in 4 weeks which she did not follow up.   Also reports callus on bottom of bilateral feet. Had this removed here ~2 years ago which good results.     Review of Systems:  Review of Systems  Constitutional: Negative for chills, fever and malaise/fatigue.  HENT: Positive for hearing loss. Negative for ear pain.        Baseline finding  Respiratory: Positive for cough and wheezing (occasionally).   Cardiovascular: Negative for chest pain, palpitations and leg swelling.  Gastrointestinal: Positive for heartburn. Negative for abdominal pain, constipation, diarrhea, nausea and vomiting.  Genitourinary: Positive for frequency. Negative for dysuria, flank pain, hematuria and urgency.  Musculoskeletal: Positive for back pain. Negative for falls.  Skin: Negative.   Neurological: Positive for  dizziness.  Psychiatric/Behavioral: Negative.     Past Medical History:  Diagnosis Date  . ALLERGIC RHINITIS   . Anxiety   . Asthma   . Blood type O+   . Cervical strain, acute   . Cirrhosis ([place])   . Colon polyp 2010   adenoma  . Diverticulosis of colon   . Dizziness   . Esophageal varices ([place])   . Fibromyalgia   . GERD (gastroesophageal reflux disease)   . History of nephrolithiasis   . Hypercholesterolemia    borderline  . Hypertension   . IBS (irritable bowel syndrome)   . Osteoarthritis   . Personal history of allergy to  unspecified medicinal agent   . Postconcussion syndrome   . Vitamin D deficiency    Past Surgical History:  Procedure Laterality Date  . CHOLECYSTECTOMY, LAPAROSCOPIC  2004   for gallstone pancreatitis  . KNEE SURGERY Right   . KNEE SURGERY Left   . THYROID SURGERY     Social History:   reports that she quit smoking about 55 years ago. Her smoking use included cigarettes. She has a 16.00 pack-year smoking history. She has never used smokeless tobacco. She reports that she does not drink alcohol or use drugs.  Family History  Problem Relation Age of Onset  . Breast cancer Mother   . Alzheimer's disease Mother   . Heart disease Mother   . COPD Brother   . Heart disease Sister   . Breast cancer Sister   . Alcohol abuse Sister   . Ovarian cancer Paternal Grandmother   . Arthritis Other        Cousins   . COPD Cousin        Maternal side   . Heart attack Maternal Uncle     Medications: Patient's Medications  New Prescriptions   No medications on file  Previous Medications   ADVAIR HFA 115-21 MCG/ACT INHALER    Inhale 2 puffs into the lungs 2 (two) times daily.    ALBUTEROL (PROVENTIL HFA;VENTOLIN HFA) 108 (90 BASE) MCG/ACT INHALER    Inhale 2 puffs every 4 (four) hours as needed into the lungs for wheezing or shortness of breath. RESCUE   ASPIRIN 81 MG EC TABLET    Take 81 mg by mouth daily.     CALCIUM CARBONATE-VITAMIN D (CALCIUM 600+D) 600-400 MG-UNIT TABLET    Take 1 tablet by mouth 2 (two) times daily.   CETIRIZINE HCL (ZYRTEC ALLERGY) 10 MG CAPS    Take 10 mg by mouth daily.    CHOLECALCIFEROL (VITAMIN D3) 1000 UNITS CAPS    Take 1,000 Units by mouth daily.    ESOMEPRAZOLE (NEXIUM) 40 MG CAPSULE    TAKE 1 CAPSULE BY MOUTH EVERY DAY   FLUTICASONE (FLONASE) 50 MCG/ACT NASAL SPRAY    SHAKE LIQUID AND USE ONE TO TWO SPRAYS IN EACH NOSTRIL TWICE DAILY   HYDROCHLOROTHIAZIDE (MICROZIDE) 12.5 MG CAPSULE    TAKE 1 CAPSULE BY MOUTH EVERY DAY   KLOR-CON M20 20 MEQ TABLET    TAKE 1  TABLET BY MOUTH EVERY DAY   MECLIZINE (ANTIVERT) 25 MG TABLET    TAKE 1 TABLET 3 TIMES A DAY AS NEEDED FOR DIZZINESS/NAUSEA   MULTIPLE VITAMINS-MINERALS (CENTRUM SILVER PO)    Take 1 tablet by mouth daily.    OMEGA-3 FATTY ACIDS (FISH OIL MAXIMUM STRENGTH) 1200 MG CAPS    Take 1,200 mg by mouth 2 (two) times daily.    PROPRANOLOL (INDERAL) 10 MG TABLET  TAKE 1 TABLET BY MOUTH TWICE A DAY   PROPYLENE GLYCOL (SYSTANE BALANCE) 0.6 % SOLN    Place 1 drop into both ears 2 (two) times daily.   SALINE NASAL SPRAY NA    Place 1 spray into both nostrils 2 (two) times daily.   Modified Medications   No medications on file  Discontinued Medications   AZITHROMYCIN (ZITHROMAX) 250 MG TABLET    2 tabs today, then one tab daily for 4 days     Physical Exam:  Vitals:   02/10/18 0823  BP: 140/82  Pulse: 87  Temp: 98 F (36.7 C)  TempSrc: Oral  SpO2: 96%  Weight: 200 lb 6.4 oz (90.9 kg)  Height: 5' 3"  (1.6 m)   Body mass index is 35.5 kg/m.  Physical Exam  Constitutional: She is oriented to person, place, and time. She appears well-developed and well-nourished.  HENT:  Head: Normocephalic and atraumatic.  Eyes: Pupils are equal, round, and reactive to light.  Neck: Normal range of motion. Neck supple.  Cardiovascular: Normal rate, regular rhythm and normal heart sounds.  Pulmonary/Chest: Effort normal and breath sounds normal.  Abdominal: Soft. Bowel sounds are normal. She exhibits no distension. There is no tenderness. There is no guarding.  Obese abdomen  Musculoskeletal: Normal range of motion. She exhibits no edema.       Lumbar back: She exhibits tenderness.       Back:  Tenderness noted. Callus noted to plantar surface of feet bilaterally  Neurological: She is alert and oriented to person, place, and time.  Skin: Skin is warm and dry. Capillary refill takes less than 2 seconds.    Labs reviewed: Basic Metabolic Panel: Recent Labs    07/06/17 0523 07/16/17 1436  12/18/17 1525  NA 138 138 139  K 3.6 4.8 4.2  CL 106 99 102  CO2 23 30 31   GLUCOSE 113* 142* 149*  BUN 23* 12 14  CREATININE 0.67 0.88 0.70  CALCIUM 7.8* 10.1 9.8   Liver Function Tests: Recent Labs    07/05/17 0917 07/16/17 1436 12/18/17 1525  AST 53* 34 29  ALT 42 38* 33  ALKPHOS 71  --  102  BILITOT 0.9 0.8 0.5  PROT 6.4* 6.9 6.7  ALBUMIN 3.8  --  4.0   Recent Labs    07/05/17 0917 07/06/17 0523  LIPASE 274* 29   No results for input(s): AMMONIA in the last 8760 hours. CBC: Recent Labs    07/05/17 0917 07/06/17 0523 07/16/17 1436 12/18/17 1525  WBC 20.2* 8.8 11.1* 8.3  NEUTROABS 17.7*  --  8,702* 3.4  HGB 15.7* 12.9 14.7 14.8  HCT 46.6* 39.6 41.6 43.7  MCV 88.8 89.8 84.9 84.8  PLT 257 232 381 240.0   Lipid Panel: Recent Labs    07/16/17 1436  CHOL 206*  HDL 47*  LDLCALC 127*  TRIG 201*  CHOLHDL 4.4   TSH: No results for input(s): TSH in the last 8760 hours. A1C: Lab Results  Component Value Date   HGBA1C 5.9 (H) 07/16/2017     Assessment/Plan 1. Gastroesophageal reflux disease without esophagitis Stable.  - esomeprazole (NEXIUM) 40 MG capsule; Take 1 capsule (40 mg total) by mouth daily.  Dispense: 30 capsule; Refill: 6  2. Asthmatic bronchitis without complication, unspecified asthma severity, unspecified whether persistent Ongoing, without acute flare at this time. Reviewed previous pulmonary noted and she has not followed up as recommended. Pt plans to make follow up appt. To continue current pulmonary regimen  at this time.   3. Chronic left-sided low back pain without sciatica Chronic and worsening pain.  - DG Thoracic Spine W/Swimmers; Future - DG Lumbar Spine Complete; Future - Ambulatory referral to Physical Therapy for evaluation and treatment.  -tylenol 650 mg by mouth every 6 hours as needed -to use heat TID with muscle rub  4. Urinary frequency -reports greenish/yellow urine, will get UA with culture, encouraged proper  hydrated at this time - POC Urinalysis Dipstick - Culture, Urine  5. Callus of foot Pt plans to follow up for removal.   Next appt: 03/30/2018 Janett Billow K. Darby, [facility] [phone]

## 2018-02-10 NOTE — Patient Instructions (Addendum)
To use tylenol 325 mg 2 tablets every 6 hours as needed for pain To use heat to lower back 3 times daily- AFTER heat use muscle rub ([place] pas, icy hot, bengay, biofreeze)  Will get xrays of spine.  PT ordered to help with this.   To make appt for callus removal. (need separate appt for this)  To make a follow up with pulmonary - last visit was in November of 2018 and they wanted to see you back in 4 weeks.

## 2018-02-11 ENCOUNTER — Telehealth: Payer: Self-pay

## 2018-02-11 ENCOUNTER — Other Ambulatory Visit: Payer: Self-pay | Admitting: Nurse Practitioner

## 2018-02-11 MED ORDER — AMLODIPINE BESYLATE 5 MG PO TABS: 5.0000 mg | ORAL_TABLET | Freq: Every day | ORAL | 0 refills | Status: DC

## 2018-02-11 NOTE — Telephone Encounter (Signed)
R sent, patient aware.

## 2018-02-11 NOTE — Telephone Encounter (Signed)
Yes this is okay to refill the amlodipine 5 mg by mouth daily

## 2018-02-11 NOTE — Telephone Encounter (Signed)
Patient was taking amlodipine 5 mg cap, 1 by mouth daily for years and was told to stop by GI doctor in Feb or Mar 2019 due to concerns of cirrhosis of the liver.    Patient was placed on propranolol 10 mg by th GI doctor as a replacement to the amlodipine. Patient felt like propranolol did not agree with her and called the GI doctor to tell them she would switch back to amlodipine about 2 weeks ago. Patient plans to see GI doctor 02/25/18. Right now patient needs rx for amlodipine, yet she has enough to last until her appointment with the GI doctor. Patient not sure if she should wait and let the GI doctor address of is [name] will advise.  Per patient she and [name] had a conversation about this at yesterday's visit.   Pharmacy on file verified. Patient would like 30 day supply if [name] approves   I have updated the patient's medication list by removing propranolol that she is no longer taking.  S.Chrae B/CMA

## 2018-02-12 ENCOUNTER — Other Ambulatory Visit: Payer: Self-pay | Admitting: Nurse Practitioner

## 2018-02-12 DIAGNOSIS — K219 Gastro-esophageal reflux disease without esophagitis: Secondary | ICD-10-CM

## 2018-02-13 ENCOUNTER — Other Ambulatory Visit: Payer: Self-pay | Admitting: Nurse Practitioner

## 2018-02-13 LAB — URINE CULTURE
MICRO NUMBER:: 91177558
SPECIMEN QUALITY: ADEQUATE

## 2018-02-13 MED ORDER — SULFAMETHOXAZOLE-TRIMETHOPRIM 800-160 MG PO TABS: 1.0000 | ORAL_TABLET | Freq: Two times a day (BID) | ORAL | 0 refills | Status: DC

## 2018-02-25 ENCOUNTER — Encounter: Payer: Self-pay | Admitting: Gastroenterology

## 2018-02-25 ENCOUNTER — Ambulatory Visit: Payer: PPO | Admitting: Gastroenterology

## 2018-02-25 VITALS — BP 130/76 | HR 66 | Ht 63.0 in | Wt 197.0 lb

## 2018-02-25 DIAGNOSIS — I8500 Esophageal varices without bleeding: Secondary | ICD-10-CM

## 2018-02-25 DIAGNOSIS — R059 Cough, unspecified: Secondary | ICD-10-CM

## 2018-02-25 DIAGNOSIS — K7460 Unspecified cirrhosis of liver: Secondary | ICD-10-CM | POA: Diagnosis not present

## 2018-02-25 DIAGNOSIS — R05 Cough: Secondary | ICD-10-CM | POA: Diagnosis not present

## 2018-02-25 MED ORDER — PROPRANOLOL HCL 10 MG PO TABS: 10.0000 mg | ORAL_TABLET | Freq: Two times a day (BID) | ORAL | 1 refills | Status: DC

## 2018-02-25 MED ORDER — BENZONATATE 100 MG PO CAPS: 100.0000 mg | ORAL_CAPSULE | Freq: Three times a day (TID) | ORAL | 1 refills | Status: DC | PRN

## 2018-02-25 NOTE — Patient Instructions (Signed)
If you are age 79 or older, your body mass index should be between 23-30. Your Body mass index is 34.9 kg/m. If this is out of the aforementioned range listed, please consider follow up with your Primary Care Provider.  If you are age 23 or younger, your body mass index should be between 19-25. Your Body mass index is 34.9 kg/m. If this is out of the aformentioned range listed, please consider follow up with your Primary Care Provider.   We have sent the following medications to your pharmacy for you to pick up at your convenience: Tessalon perles: 100 mg Take 3x a day as needed.  Please follow up with your Primary Care doctor.  Propranolol 10 mg: Take twice a day.  Call us in one month to let us know if this has been helpful.  Note: Do not take Amlodipine when take Propranolol  Thank you for entrusting me with your care and for choosing [facility], Dr. [place]aels Cellar

## 2018-02-25 NOTE — Progress Notes (Signed)
HPI :  77 year old female here for follow-up visit. Previously she had an EGD for reassessment of Barrett's. While no Barrett's esophagus was noted she did have esophageal candidiasis, and incidentally noted was the presence of esophageal varices. This led to a workup for underlying cirrhosis.  [place] 07/04/2017 - heterogenous echotexture c/w fatty liver or intrinsic liver disease, subtle nodularity may reflect cirrhosis,  CT scan 07/05/2017 - fatty liver, there is some central volume loss which raises the possibility of cirrhosis  CT chest 07/11/17 - esophagus appears normal on CT scan, no obvious varices  She had lab workup done for evaluation of chronic liver diseases which was negative. She is not immune to hepatitis A and vaccination was recommended. We stopped amlodipine and she was started on propranolol 10 mg twice daily for treatment of esophageal varices. Started on a low-dose to ensure she tolerated in light of low blood pressure. She took it for a period of time but stopped in recent months due to possible side effects, mostly dizziness.   She had an [place] since our last visit on 8/5 - no evidence of HCC. AFP okay. She has questions about long-term management of the varices. She reports in retrospect that she is always had dizziness which she thinks is more than likely due to vertigo, and wanted to discuss resuming the propranolol. She has other issues she reports follow-up today including recent cough she's been dealing with, as well as feelings of fatigue and weakness. No jaundice or abdominal pains is bothering her.. No new lower extremity edema. She's had no history of ascites.  EGD 06/25/2017 -  No evidence of Barrett's, diminutive white plaques in the esophagus c/w candidiasis, suspected varices in mid esophagus, multiple gastric polyps, normal stomach / duodenum - fundic gland polyps  Colonoscopy 06/25/2017 - 9 small adenomas, a few small-mouthed diverticula were found in the sigmoid colon  and ascending colon, Internal hemorrhoids were found during retroflexion. The hemorrhoids were moderate.   Colonoscopy 07/28/2008 - diverticulosis, one small adenoma, biopsies taken to rule out microscopic colitis and negative EGD 9/209/2006 - 2cm segment of possible BE - bx show reflux changes only, no BE   Past Medical History:  Diagnosis Date  . ALLERGIC RHINITIS   . Anxiety   . Asthma   . Blood type O+   . Cervical strain, acute   . Cirrhosis ([place])   . Colon polyp 2010   adenoma  . Diverticulosis of colon   . Dizziness   . Esophageal varices ([place])   . Fibromyalgia   . GERD (gastroesophageal reflux disease)   . History of nephrolithiasis   . Hypercholesterolemia    borderline  . Hypertension   . IBS (irritable bowel syndrome)   . Osteoarthritis   . Personal history of allergy to unspecified medicinal agent   . Postconcussion syndrome   . Vitamin D deficiency      Past Surgical History:  Procedure Laterality Date  . CHOLECYSTECTOMY, LAPAROSCOPIC  2004   for gallstone pancreatitis  . KNEE SURGERY Right   . KNEE SURGERY Left   . THYROID SURGERY     Family History  Problem Relation Age of Onset  . Breast cancer Mother   . Alzheimer's disease Mother   . Heart disease Mother   . COPD Brother   . Heart disease Sister   . Breast cancer Sister   . Alcohol abuse Sister   . Ovarian cancer Paternal Grandmother   . Arthritis Other  Cousins   . COPD Cousin        Maternal side   . Heart attack Maternal Uncle    Social History   Tobacco Use  . Smoking status: Former Smoker    Packs/day: 2.00    Years: 8.00    Pack years: 16.00    Types: Cigarettes    Last attempt to quit: 05/13/1962    Years since quitting: 55.8  . Smokeless tobacco: Never Used  Substance Use Topics  . Alcohol use: No  . Drug use: No   Current Outpatient Medications  Medication Sig Dispense Refill  . ADVAIR HFA 115-21 MCG/ACT inhaler Inhale 2 puffs into the lungs 2 (two) times daily.      Marland Kitchen albuterol (PROVENTIL HFA;VENTOLIN HFA) 108 (90 Base) MCG/ACT inhaler Inhale 2 puffs every 4 (four) hours as needed into the lungs for wheezing or shortness of breath. RESCUE 3 Inhaler 1  . amLODipine (NORVASC) 5 MG tablet Take 1 tablet (5 mg total) by mouth daily. 30 tablet 0  . aspirin 81 MG EC tablet Take 81 mg by mouth daily.      . Calcium Carbonate-Vitamin D (CALCIUM 600+D) 600-400 MG-UNIT tablet Take 1 tablet by mouth 2 (two) times daily.    . Cetirizine HCl (ZYRTEC ALLERGY) 10 MG CAPS Take 10 mg by mouth daily.     . Cholecalciferol (VITAMIN D3) 1000 UNITS CAPS Take 1,000 Units by mouth daily.     Marland Kitchen esomeprazole (NEXIUM) 40 MG capsule Take 1 capsule (40 mg total) by mouth daily. 30 capsule 6  . fluticasone (FLONASE) 50 MCG/ACT nasal spray SHAKE LIQUID AND USE ONE TO TWO SPRAYS IN EACH NOSTRIL TWICE DAILY 16 g 3  . hydrochlorothiazide (MICROZIDE) 12.5 MG capsule TAKE 1 CAPSULE BY MOUTH EVERY DAY 90 capsule 1  . KLOR-CON M20 20 MEQ tablet TAKE 1 TABLET BY MOUTH EVERY DAY 90 tablet 1  . meclizine (ANTIVERT) 25 MG tablet TAKE 1 TABLET 3 TIMES A DAY AS NEEDED FOR DIZZINESS/NAUSEA 90 tablet 0  . Multiple Vitamins-Minerals (CENTRUM SILVER PO) Take 1 tablet by mouth daily.     . Omega-3 Fatty Acids (FISH OIL MAXIMUM STRENGTH) 1200 MG CAPS Take 1,200 mg by mouth 2 (two) times daily.     Marland Kitchen Propylene Glycol (SYSTANE BALANCE) 0.6 % SOLN Place 1 drop into both ears 2 (two) times daily. 15 mL 0  . SALINE NASAL SPRAY NA Place 1 spray into both nostrils 2 (two) times daily.      No current facility-administered medications for this visit.    Allergies  Allergen Reactions  . Ace Inhibitors   . Amoxicillin Itching    REACTION: unknown? pain in right kidney  . Benicar Hct [Olmesartan Medoxomil-Hctz]     Extreme weakness  . Budesonide-Formoterol Fumarate     Causes tremors and numbness  . Chlorzoxazone [Chlorzoxazone]   . Citalopram Hydrobromide     REACTION: hives  . Citalopram Hydrobromide     . Clonidine Derivatives   . Droperidol     REACTION: hives  . Flexeril [Cyclobenzaprine Hcl]   . Fluconazole Nausea And Vomiting and Other (See Comments)    fatigue  . Ketorolac Tromethamine     REACTION: hives  . Ketorolac Tromethamine   . Metoclopramide Hcl   . Mometasone Furo-Formoterol Fum     Causes sore throat, blurred vision and unable to sleep--started on med 09-17-10  . Morphine     REACTION: hives and itching  . Olmesartan Medoxomil  REACTION: fatique  . Breo Ellipta [Fluticasone Furoate-Vilanterol] Itching    Pt reports itching with Memory [place] is related to being lactose intolerant.      Review of Systems: All systems reviewed and negative except where noted in HPI.   Lab Results  Component Value Date   WBC 8.3 12/18/2017   HGB 14.8 12/18/2017   HCT 43.7 12/18/2017   MCV 84.8 12/18/2017   PLT 240.0 12/18/2017    Lab Results  Component Value Date   CREATININE 0.70 12/18/2017   BUN 14 12/18/2017   NA 139 12/18/2017   K 4.2 12/18/2017   CL 102 12/18/2017   CO2 31 12/18/2017    Lab Results  Component Value Date   ALT 33 12/18/2017   AST 29 12/18/2017   ALKPHOS 102 12/18/2017   BILITOT 0.5 12/18/2017   Lab Results  Component Value Date   INR 0.9 12/18/2017   INR 1.0 07/11/2017     Physical Exam: BP 130/76   Pulse 66   Ht 5' 3"  (1.6 m)   Wt 197 lb (89.4 kg)   BMI 34.90 kg/m  Constitutional: Pleasant, female in no acute distress. HEENT: Normocephalic and atraumatic. Conjunctivae are normal. No scleral icterus. Neck supple.  Cardiovascular: Normal rate, regular rhythm.  Pulmonary/chest: Effort normal and breath sounds normal. . Abdominal: Soft, nondistended, nontender. there are no masses palpable. No hepatomegaly. Extremities: no edema Lymphadenopathy: No cervical adenopathy noted. Neurological: Alert and oriented to person place and time. Skin: Skin is warm and dry. No rashes noted. Psychiatric: Normal mood and affect. Behavior is  normal.   ASSESSMENT AND PLAN: 77 year old female here for reassessment following issues:  Cirrhosis / esophageal varices - the varices were incidentally noted during recent EGD, which led to workup for liver disease. Imaging shows steatosis and suggests cirrhosis on CT, although spleen and platelets normal. Given the findings of esophageal varices and imaging I suspect she does have portal hypertension with likely cirrhosis from NASH, but is otherwise compensated at this time. We discussed cirrhosis, risks for decompensation and HCC. Her [place] screening is UTD. Vaccinated to hep A. Regarding the varices we discussed options. She did not think she tolerated propranolol when tried before, but she continues to have the same symptoms since stopping it. Her dizziness seems actually more likely related to vertigo. We discussed whether or not she wanted to try propranolol again versus repeating EGD at some point with banding of the varices. I discussed EGD with her, she wants to avoid EGD if at all possible and other invasive procedures. She felt strongly about wanting to try propranolol, if she does this would do a low dose of 74m BID to start and have her hold the Norvasc during this time. I will touch base with her PCP to let them know about the switch. I asked her to call me in a few weeks to let me know how she is doing. Otherwise, repeat [place] labs in February. Avoid NSAIDs. Follow up for final hep A vaccine in December.  Cough - asking for refill of tessalon perles which have helped in the past. Gave refill, if cough persists she should follow up with PCP.   SCarolina Cellar MD [facility]

## 2018-02-26 ENCOUNTER — Ambulatory Visit: Payer: PPO

## 2018-02-26 ENCOUNTER — Ambulatory Visit
Admission: RE | Admit: 2018-02-26 | Discharge: 2018-02-26 | Disposition: A | Discharge status: Home / Self Care | Payer: PPO | Source: Ambulatory Visit | Attending: Nurse Practitioner | Admitting: Nurse Practitioner

## 2018-02-26 DIAGNOSIS — R928 Other abnormal and inconclusive findings on diagnostic imaging of breast: Secondary | ICD-10-CM | POA: Diagnosis not present

## 2018-02-26 DIAGNOSIS — R234 Changes in skin texture: Secondary | ICD-10-CM

## 2018-03-14 ENCOUNTER — Other Ambulatory Visit: Payer: Self-pay | Admitting: Nurse Practitioner

## 2018-03-15 ENCOUNTER — Encounter: Payer: Self-pay | Admitting: Gastroenterology

## 2018-03-20 ENCOUNTER — Other Ambulatory Visit: Payer: Self-pay | Admitting: Gastroenterology

## 2018-03-27 ENCOUNTER — Ambulatory Visit
Admission: RE | Admit: 2018-03-27 | Discharge: 2018-03-27 | Disposition: A | Discharge status: Home / Self Care | Payer: PPO | Source: Ambulatory Visit | Attending: Nurse Practitioner | Admitting: Nurse Practitioner

## 2018-03-27 DIAGNOSIS — G8929 Other chronic pain: Secondary | ICD-10-CM

## 2018-03-27 DIAGNOSIS — M546 Pain in thoracic spine: Secondary | ICD-10-CM | POA: Diagnosis not present

## 2018-03-27 DIAGNOSIS — M545 Low back pain, unspecified: Secondary | ICD-10-CM

## 2018-03-27 DIAGNOSIS — M5136 Other intervertebral disc degeneration, lumbar region: Secondary | ICD-10-CM | POA: Diagnosis not present

## 2018-03-28 ENCOUNTER — Other Ambulatory Visit: Payer: Self-pay

## 2018-03-28 ENCOUNTER — Encounter (HOSPITAL_COMMUNITY): Payer: Self-pay

## 2018-03-28 ENCOUNTER — Emergency Department (HOSPITAL_COMMUNITY): Payer: PPO

## 2018-03-28 ENCOUNTER — Emergency Department (HOSPITAL_COMMUNITY)
Admission: EM | Admit: 2018-03-28 | Discharge: 2018-03-28 | Disposition: A | Discharge status: Home / Self Care | Payer: PPO | Attending: Emergency Medicine | Admitting: Emergency Medicine

## 2018-03-28 DIAGNOSIS — I10 Essential (primary) hypertension: Secondary | ICD-10-CM | POA: Diagnosis not present

## 2018-03-28 DIAGNOSIS — J029 Acute pharyngitis, unspecified: Secondary | ICD-10-CM | POA: Insufficient documentation

## 2018-03-28 DIAGNOSIS — Z7982 Long term (current) use of aspirin: Secondary | ICD-10-CM | POA: Insufficient documentation

## 2018-03-28 DIAGNOSIS — R062 Wheezing: Secondary | ICD-10-CM

## 2018-03-28 DIAGNOSIS — J069 Acute upper respiratory infection, unspecified: Secondary | ICD-10-CM | POA: Diagnosis not present

## 2018-03-28 DIAGNOSIS — Z79899 Other long term (current) drug therapy: Secondary | ICD-10-CM | POA: Diagnosis not present

## 2018-03-28 DIAGNOSIS — R0602 Shortness of breath: Secondary | ICD-10-CM | POA: Diagnosis not present

## 2018-03-28 DIAGNOSIS — J4520 Mild intermittent asthma, uncomplicated: Secondary | ICD-10-CM | POA: Diagnosis not present

## 2018-03-28 DIAGNOSIS — Z87891 Personal history of nicotine dependence: Secondary | ICD-10-CM | POA: Diagnosis not present

## 2018-03-28 DIAGNOSIS — B9789 Other viral agents as the cause of diseases classified elsewhere: Secondary | ICD-10-CM

## 2018-03-28 DIAGNOSIS — R05 Cough: Secondary | ICD-10-CM | POA: Diagnosis not present

## 2018-03-28 MED ORDER — IPRATROPIUM-ALBUTEROL 0.5-2.5 (3) MG/3ML IN SOLN
3.0000 mL | Freq: Once | RESPIRATORY_TRACT | Status: AC
  Administered 2018-03-28: 3 mL via RESPIRATORY_TRACT

## 2018-03-28 MED ORDER — ALBUTEROL SULFATE (2.5 MG/3ML) 0.083% IN NEBU: 5.0000 mg | INHALATION_SOLUTION | Freq: Once | RESPIRATORY_TRACT | Status: DC

## 2018-03-28 NOTE — ED Triage Notes (Signed)
Pt from home w/ a c/o a sore throat, throat swelling, and difficulty swallowing that began tonight. Denies SOB. She thinks that she may have taken a double dose of her Propranolol. Has a hx of esophageal varices, chronic bronchitis, and HTN.

## 2018-03-28 NOTE — ED Provider Notes (Signed)
[facility] EMERGENCY DEPARTMENT Provider Note  CSN: [id number] Arrival date & time: 03/28/18 0009  Chief Complaint(s) Sore Throat  HPI [name] is a 77 y.o. female with a past medical history listed below including asthma who presents to the emergency department with several days of runny nose, cough, congestion who began having sore throat today with odynophagia.  Denies any fevers or chills.  Endorses wheezing without shortness of breath.  No known sick contacts.  No headache, neck pain, chest pain, nausea, vomiting, abdominal pain.  HPI  Past Medical History Past Medical History:  Diagnosis Date  . ALLERGIC RHINITIS   . Anxiety   . Asthma   . Blood type O+   . Cervical strain, acute   . Cirrhosis ([place])   . Colon polyp 2010   adenoma  . Diverticulosis of colon   . Dizziness   . Esophageal varices ([place])   . Fibromyalgia   . GERD (gastroesophageal reflux disease)   . History of nephrolithiasis   . Hypercholesterolemia    borderline  . Hypertension   . IBS (irritable bowel syndrome)   . Osteoarthritis   . Personal history of allergy to unspecified medicinal agent   . Postconcussion syndrome   . Vitamin D deficiency    Patient Active Problem List   Diagnosis Date Noted  . Pancreatitis 07/05/2017  . Chronic pansinusitis 12/02/2016  . Asthma, allergic, mild intermittent, uncomplicated 88/41/6606  . Environmental and seasonal allergies 12/02/2016  . Obesity (BMI 30.0-34.9) 12/02/2016  . Urine frequency 06/26/2016  . Obese 06/26/2016  . Hyperglycemia 11/13/2015  . Numbness and tingling of both legs below knees 05/24/2014  . Dupuytren's contracture of both hands 01/18/2014  . Dyslipidemia 10/08/2011  . Asthmatic bronchitis 04/25/2011  . Acute sinusitis, unspecified 12/20/2010  . Back pain 07/12/2010  . NEPHROLITHIASIS 04/26/2009  . Vitamin D deficiency 10/30/2008  . COLONIC POLYPS 10/22/2007  . Diverticulosis of colon 10/22/2007  .  Postconcussion syndrome 07/10/2007  . Seasonal and perennial allergic rhinitis 06/13/2007  . GERD 06/13/2007  . IBS 06/13/2007  . FIBROMYALGIA 06/13/2007  . PERSONAL HISTORY ALLERGY UNSPEC MEDICINAL AGENT 06/13/2007  . Anxiety state 05/04/2007  . Essential hypertension 05/04/2007  . Osteoarthritis 05/04/2007  . DIZZINESS 04/28/2007   Home Medication(s) Prior to Admission medications   Medication Sig Start Date End Date Taking? Authorizing Provider  ADVAIR HFA 115-21 MCG/ACT inhaler Inhale 2 puffs into the lungs 2 (two) times daily.  08/17/16  Yes [provider]  albuterol (PROVENTIL HFA;VENTOLIN HFA) 108 (90 Base) MCG/ACT inhaler Inhale 2 puffs every 4 (four) hours as needed into the lungs for wheezing or shortness of breath. RESCUE 03/27/17 05/03/19 Yes Lauree Chandler, NP  aspirin 81 MG EC tablet Take 81 mg by mouth daily.     Yes [provider]  Calcium Carbonate-Vitamin D (CALCIUM 600+D) 600-400 MG-UNIT tablet Take 1 tablet by mouth 2 (two) times daily.   Yes [provider]  Cetirizine HCl (ZYRTEC ALLERGY) 10 MG CAPS Take 10 mg by mouth daily.    Yes [provider]  Cholecalciferol (VITAMIN D3) 1000 UNITS CAPS Take 1,000 Units by mouth daily.    Yes [provider]  esomeprazole (NEXIUM) 40 MG capsule Take 1 capsule (40 mg total) by mouth daily. 02/10/18 03/28/25 Yes Eubanks, Carlos American, NP  fluticasone (FLONASE) 50 MCG/ACT nasal spray SHAKE LIQUID AND USE ONE TO TWO SPRAYS IN EACH NOSTRIL TWICE DAILY Patient taking differently: Place 1 spray into both nostrils  2 (two) times daily.  12/19/17  Yes Lauree Chandler, NP  hydrochlorothiazide (MICROZIDE) 12.5 MG capsule TAKE 1 CAPSULE BY MOUTH EVERY DAY Patient taking differently: Take 12.5 mg by mouth daily.  11/17/17  Yes Eubanks, Carlos American, NP  KLOR-CON M20 20 MEQ tablet TAKE 1 TABLET BY MOUTH EVERY DAY Patient taking differently: Take 20 mEq by mouth daily.  11/17/17  Yes Lauree Chandler, NP   Multiple Vitamins-Minerals (CENTRUM SILVER PO) Take 1 tablet by mouth daily.    Yes [provider]  Omega-3 Fatty Acids (FISH OIL MAXIMUM STRENGTH) 1200 MG CAPS Take 1,200 mg by mouth 2 (two) times daily.    Yes [provider]  propranolol (INDERAL) 10 MG tablet TAKE 1 TAB BY MOUTH 2 TIMES DAILY. PLEASE CALL us AFTER 30 DAYS TO LET us KNOW HOW YOU ARE DOING. Patient taking differently: Take 10 mg by mouth 2 (two) times daily.  03/20/18  Yes Armbruster, Carlota Raspberry, MD  Propylene Glycol (SYSTANE BALANCE) 0.6 % SOLN Place 1 drop into both ears 2 (two) times daily. Patient taking differently: Place 1 drop into both eyes 2 (two) times daily.  11/27/17  Yes Reed, Tiffany L, DO  SALINE NASAL SPRAY NA Place 1 spray into both nostrils 2 (two) times daily.    Yes [provider]  amLODipine (NORVASC) 5 MG tablet TAKE 1 TABLET BY MOUTH EVERY DAY Patient not taking: Reported on 03/28/2018 03/16/18   Lauree Chandler, NP  benzonatate (TESSALON PERLES) 100 MG capsule Take 1 capsule (100 mg total) by mouth 3 (three) times daily as needed for cough. Patient not taking: Reported on 03/28/2018 02/25/18   Yetta Flock, MD  meclizine (ANTIVERT) 25 MG tablet TAKE 1 TABLET 3 TIMES A DAY AS NEEDED FOR DIZZINESS/NAUSEA Patient not taking: Reported on 03/28/2018 08/13/17   Lauree Chandler, NP                                                                                                                                    Past Surgical History Past Surgical History:  Procedure Laterality Date  . CHOLECYSTECTOMY, LAPAROSCOPIC  2004   for gallstone pancreatitis  . KNEE SURGERY Right   . KNEE SURGERY Left   . THYROID SURGERY     Family History Family History  Problem Relation Age of Onset  . Breast cancer Mother   . Alzheimer's disease Mother   . Heart disease Mother   . COPD Brother   . Heart disease Sister   . Breast cancer Sister 64  . Alcohol abuse Sister   . Ovarian  cancer Paternal Grandmother   . Arthritis Other        Cousins   . COPD Cousin        Maternal side   . Heart attack Maternal Uncle     Social History Social History   Tobacco Use  . Smoking status:  Former Smoker    Packs/day: 2.00    Years: 8.00    Pack years: 16.00    Types: Cigarettes    Last attempt to quit: 05/13/1962    Years since quitting: 55.9  . Smokeless tobacco: Never Used  Substance Use Topics  . Alcohol use: No  . Drug use: No   Allergies Ace inhibitors; Amoxicillin; Benicar hct [olmesartan medoxomil-hctz]; Budesonide-formoterol fumarate; Chlorzoxazone [chlorzoxazone]; Citalopram hydrobromide; Citalopram hydrobromide; Clonidine derivatives; Droperidol; Flexeril [cyclobenzaprine hcl]; Fluconazole; Ketorolac tromethamine; Ketorolac tromethamine; Metoclopramide hcl; Mometasone furo-formoterol fum; Morphine; Olmesartan medoxomil; and Breo ellipta [fluticasone furoate-vilanterol]  Review of Systems Review of Systems All other systems are reviewed and are negative for acute change except as noted in the HPI  Physical Exam Vital Signs  I have reviewed the triage vital signs BP (!) 152/54   Pulse 71   Temp 98.3 F (36.8 C) (Oral)   Resp 16   Ht 5' 4.5" (1.638 m)   Wt 89.4 kg   SpO2 96%   BMI 33.29 kg/m  Physical Exam  Constitutional: She is oriented to person, place, and time. She appears well-developed and well-nourished. No distress.  HENT:  Head: Normocephalic and atraumatic.  Nose: Mucosal edema and rhinorrhea present.  Mouth/Throat: No posterior oropharyngeal edema, posterior oropharyngeal erythema or tonsillar abscesses. No tonsillar exudate.  Postnasal drip with cobblestoning  Eyes: Pupils are equal, round, and reactive to light. Conjunctivae and EOM are normal. Right eye exhibits no discharge. Left eye exhibits no discharge. No scleral icterus.  Neck: Normal range of motion. Neck supple.  Cardiovascular: Normal rate and regular rhythm. Exam reveals no  gallop and no friction rub.  No murmur heard. Pulmonary/Chest: Effort normal and breath sounds normal. No stridor. No respiratory distress. She has no rales.  Abdominal: Soft. She exhibits no distension. There is no tenderness.  Musculoskeletal: She exhibits no edema or tenderness.  Neurological: She is alert and oriented to person, place, and time.  Skin: Skin is warm and dry. No rash noted. She is not diaphoretic. No erythema.  Psychiatric: She has a normal mood and affect.  Vitals reviewed.   ED Results and Treatments Labs (all labs ordered are listed, but only abnormal results are displayed) Labs Reviewed - No data to display                                                                                                                       EKG  EKG Interpretation  Date/Time:    Ventricular Rate:    PR Interval:    QRS Duration:   QT Interval:    QTC Calculation:   R Axis:     Text Interpretation:        Radiology No results found. Pertinent labs & imaging results that were available during my care of the patient were reviewed by me and considered in my medical decision making (see chart for details).  Medications Ordered in ED Medications  ipratropium-albuterol (DUONEB) 0.5-2.5 (3) MG/3ML nebulizer solution 3  mL (3 mLs Nebulization Given 03/28/18 0140)                                                                                                                                    Procedures Procedures  (including critical care time)  Medical Decision Making / ED Course I have reviewed the nursing notes for this encounter and the patient's prior records (if available in EHR or on provided paperwork).    77 y.o. female presents with cough, rhinorrhea, nasal congestion and sore throat for 3 days days.  Adequate oral hydration. Rest of history as above.  Patient appears well. No signs of toxicity. No hypoxia, tachypnea or other signs of respiratory distress. No sign  of clinical dehydration. Lung exam with mild wheezing. Rest of exam as above.  Chest x-ray without evidence of pneumonia.  Provided with breathing treatment resulting in significant improvement in her wheezing.  Most consistent with viral respiratory infection.   No evidence suggestive of pharyngitis, AOM, PNA   Discussed symptomatic treatment with the parents and they will follow closely with their PCP.      Final Clinical Impression(s) / ED Diagnoses Final diagnoses:  Viral URI with cough  Sore throat  Wheezing   Disposition: Discharge  Condition: Good  I have discussed the results, Dx and Tx plan with the patient who expressed understanding and agree(s) with the plan. Discharge instructions discussed at great length. The patient was given strict return precautions who verbalized understanding of the instructions. No further questions at time of discharge.    ED Discharge Orders    None       Follow Up: Lauree Chandler, NP [place]. [place] [id number] [phone]  On 03/30/2018 as scheduled      This chart was dictated using voice recognition software.  Despite best efforts to proofread,  errors can occur which can change the documentation meaning.   Fatima Blank, MD 03/29/18 [phone]

## 2018-03-28 NOTE — Discharge Instructions (Addendum)
You may take over-the-counter medicine for symptomatic relief, such as Alka seltzer , black elderberry, etc. Please limit acetaminophen (Tylenol) to 4000 mg and Ibuprofen (Motrin, Advil, etc.) to 2400 mg for a 24hr period. Please note that other over-the-counter medicine may contain acetaminophen or ibuprofen as a component of their ingredients.

## 2018-03-30 ENCOUNTER — Encounter: Payer: Self-pay | Admitting: Nurse Practitioner

## 2018-03-30 ENCOUNTER — Ambulatory Visit (INDEPENDENT_AMBULATORY_CARE_PROVIDER_SITE_OTHER): Payer: PPO | Admitting: Nurse Practitioner

## 2018-03-30 VITALS — BP 134/82 | HR 72 | Temp 97.8°F | Ht 63.0 in | Wt 199.6 lb

## 2018-03-30 DIAGNOSIS — J45909 Unspecified asthma, uncomplicated: Secondary | ICD-10-CM | POA: Diagnosis not present

## 2018-03-30 DIAGNOSIS — E785 Hyperlipidemia, unspecified: Secondary | ICD-10-CM

## 2018-03-30 DIAGNOSIS — M545 Low back pain, unspecified: Secondary | ICD-10-CM

## 2018-03-30 DIAGNOSIS — L84 Corns and callosities: Secondary | ICD-10-CM

## 2018-03-30 DIAGNOSIS — I10 Essential (primary) hypertension: Secondary | ICD-10-CM | POA: Diagnosis not present

## 2018-03-30 DIAGNOSIS — G8929 Other chronic pain: Secondary | ICD-10-CM | POA: Diagnosis not present

## 2018-03-30 DIAGNOSIS — K219 Gastro-esophageal reflux disease without esophagitis: Secondary | ICD-10-CM | POA: Diagnosis not present

## 2018-03-30 DIAGNOSIS — K7460 Unspecified cirrhosis of liver: Secondary | ICD-10-CM | POA: Diagnosis not present

## 2018-03-30 DIAGNOSIS — R739 Hyperglycemia, unspecified: Secondary | ICD-10-CM | POA: Diagnosis not present

## 2018-03-30 NOTE — Patient Instructions (Signed)
Can use tylenol 500 mg 1-2 tablets twice daily, not to exceed 2000 mg in 24 hours- this is no more than 4 tablets in 24 hours.  Continue to use heat.  Can order physical therapy if needed  Weight loss recommended.   mucinex DM by mouth twice daily for 1 week then as needed- take with full glass of water

## 2018-03-30 NOTE — Progress Notes (Signed)
Careteam: Patient Care Team: Lauree Chandler, NP as PCP - General (Nurse Practitioner)  Advanced Directive information Does Patient Have a Medical Advance Directive?: Yes, Type of Advance Directive: Out of facility DNR (pink MOST or yellow form), Pre-existing out of facility DNR order (yellow form or pink MOST form): Yellow form placed in chart (order not valid for inpatient use)  Allergies  Allergen Reactions  . Ace Inhibitors   . Amoxicillin Itching    REACTION: unknown? pain in right kidney  . Benicar Hct [Olmesartan Medoxomil-Hctz]     Extreme weakness  . Budesonide-Formoterol Fumarate     Causes tremors and numbness  . Chlorzoxazone [Chlorzoxazone]   . Citalopram Hydrobromide     REACTION: hives  . Citalopram Hydrobromide   . Clonidine Derivatives   . Droperidol     REACTION: hives  . Flexeril [Cyclobenzaprine Hcl]   . Fluconazole Nausea And Vomiting and Other (See Comments)    fatigue  . Ketorolac Tromethamine     REACTION: hives  . Ketorolac Tromethamine   . Metoclopramide Hcl   . Mometasone Furo-Formoterol Fum     Causes sore throat, blurred vision and unable to sleep--started on med 09-17-10  . Morphine     REACTION: hives and itching  . Olmesartan Medoxomil     REACTION: fatique  . Breo Ellipta [Fluticasone Furoate-Vilanterol] Itching    Pt reports itching with Memory Dance is related to being lactose intolerant.     Chief Complaint  Patient presents with  . Medical Management of Chronic Issues    Pt is being seen for a 4 month routine visit. pt also seen in ED due to viral URI with cough 3 days ago.      HPI: Patient is a 77 y.o. female seen in the office today for 4 month follow up.   Asthma-Pt recently went to ED with viral URI- due to sore throat, shortness of breath, reports these symptoms are better but just overall feels poorly.  Still with cough. Reports overall she is better than last week.  She was also seen in office on 02/10/18 due to acute  bronchitis.  Using advair 2 puffs twice daily and albuterol PRN. Using more after now with URI.  Has congestion in her throat, clear.  It was recommended that she follow up with pulmonary after 10/1 appt but she has not because she felt better.   Pt followed up with GI due to cirrhosis of the liver. It was thought it the past that she could not tolerated propranolol however she was switched to low dose 10 mg BID to see if she could tolerate while stopping norvasc. Has been tolerating propranolol at this time.   OA of back and knees- reports arthritis is very painful. Uses cream but this is not effective. Hard to take care of herself. Just got xray done which were ordered in October. Did not want to go to therapy due to pain and money is an issue as well.    Review of Systems:  Review of Systems  Constitutional: Negative for chills, fever and malaise/fatigue.  HENT: Positive for congestion and hearing loss. Negative for ear pain and sore throat.   Respiratory: Positive for cough, sputum production (clear), shortness of breath and wheezing.   Cardiovascular: Negative for chest pain, palpitations and leg swelling.  Gastrointestinal: Positive for heartburn. Negative for abdominal pain, constipation, diarrhea, nausea and vomiting.  Genitourinary: Positive for frequency. Negative for dysuria, flank pain, hematuria and urgency.  Musculoskeletal:  Positive for back pain and joint pain. Negative for falls.       Painful callus on bottom of left foot  Skin: Negative.   Neurological: Negative for dizziness.  Psychiatric/Behavioral: The patient is nervous/anxious.     Past Medical History:  Diagnosis Date  . ALLERGIC RHINITIS   . Anxiety   . Asthma   . Blood type O+   . Cervical strain, acute   . Cirrhosis ([place])   . Colon polyp 2010   adenoma  . Diverticulosis of colon   . Dizziness   . Esophageal varices ([place])   . Fibromyalgia   . GERD (gastroesophageal reflux disease)   . History of  nephrolithiasis   . Hypercholesterolemia    borderline  . Hypertension   . IBS (irritable bowel syndrome)   . Osteoarthritis   . Personal history of allergy to unspecified medicinal agent   . Postconcussion syndrome   . Vitamin D deficiency    Past Surgical History:  Procedure Laterality Date  . CHOLECYSTECTOMY, LAPAROSCOPIC  2004   for gallstone pancreatitis  . KNEE SURGERY Right   . KNEE SURGERY Left   . THYROID SURGERY     Social History:   reports that she quit smoking about 55 years ago. Her smoking use included cigarettes. She has a 16.00 pack-year smoking history. She has never used smokeless tobacco. She reports that she does not drink alcohol or use drugs.  Family History  Problem Relation Age of Onset  . Breast cancer Mother   . Alzheimer's disease Mother   . Heart disease Mother   . COPD Brother   . Heart disease Sister   . Breast cancer Sister 31  . Alcohol abuse Sister   . Ovarian cancer Paternal Grandmother   . Arthritis Other        Cousins   . COPD Cousin        Maternal side   . Heart attack Maternal Uncle     Medications: Patient's Medications  New Prescriptions   No medications on file  Previous Medications   ADVAIR HFA 115-21 MCG/ACT INHALER    Inhale 2 puffs into the lungs 2 (two) times daily.    ALBUTEROL (PROVENTIL HFA;VENTOLIN HFA) 108 (90 BASE) MCG/ACT INHALER    Inhale 2 puffs every 4 (four) hours as needed into the lungs for wheezing or shortness of breath. RESCUE   ASPIRIN 81 MG EC TABLET    Take 81 mg by mouth daily.     BENZONATATE (TESSALON PERLES) 100 MG CAPSULE    Take 1 capsule (100 mg total) by mouth 3 (three) times daily as needed for cough.   CALCIUM CARBONATE-VITAMIN D (CALCIUM 600+D) 600-400 MG-UNIT TABLET    Take 1 tablet by mouth 2 (two) times daily.   CETIRIZINE HCL (ZYRTEC ALLERGY) 10 MG CAPS    Take 10 mg by mouth daily.    CHOLECALCIFEROL (VITAMIN D3) 1000 UNITS CAPS    Take 1,000 Units by mouth daily.    ESOMEPRAZOLE  (NEXIUM) 40 MG CAPSULE    Take 1 capsule (40 mg total) by mouth daily.   FLUTICASONE (FLONASE) 50 MCG/ACT NASAL SPRAY    SHAKE LIQUID AND USE ONE TO TWO SPRAYS IN EACH NOSTRIL TWICE DAILY   HYDROCHLOROTHIAZIDE (MICROZIDE) 12.5 MG CAPSULE    TAKE 1 CAPSULE BY MOUTH EVERY DAY   KLOR-CON M20 20 MEQ TABLET    TAKE 1 TABLET BY MOUTH EVERY DAY   MECLIZINE (ANTIVERT) 25 MG TABLET  TAKE 1 TABLET 3 TIMES A DAY AS NEEDED FOR DIZZINESS/NAUSEA   MULTIPLE VITAMINS-MINERALS (CENTRUM SILVER PO)    Take 1 tablet by mouth daily.    OMEGA-3 FATTY ACIDS (FISH OIL MAXIMUM STRENGTH) 1200 MG CAPS    Take 1,200 mg by mouth 2 (two) times daily.    PROPRANOLOL (INDERAL) 10 MG TABLET    TAKE 1 TAB BY MOUTH 2 TIMES DAILY. PLEASE CALL us AFTER 30 DAYS TO LET us KNOW HOW YOU ARE DOING.   PROPYLENE GLYCOL (SYSTANE BALANCE) 0.6 % SOLN    Place 1 drop into both ears 2 (two) times daily.   SALINE NASAL SPRAY NA    Place 1 spray into both nostrils 2 (two) times daily.   Modified Medications   No medications on file  Discontinued Medications   AMLODIPINE (NORVASC) 5 MG TABLET    TAKE 1 TABLET BY MOUTH EVERY DAY     Physical Exam:  Vitals:   03/30/18 1014  BP: 134/82  Pulse: 72  Temp: 97.8 F (36.6 C)  TempSrc: Oral  SpO2: 96%  Weight: 199 lb 9.6 oz (90.5 kg)  Height: 5' 3"  (1.6 m)   Body mass index is 35.36 kg/m.  Physical Exam  Constitutional: She is oriented to person, place, and time. She appears well-developed and well-nourished.  HENT:  Head: Normocephalic and atraumatic.  Eyes: Pupils are equal, round, and reactive to light.  Neck: Normal range of motion. Neck supple.  Cardiovascular: Normal rate, regular rhythm and normal heart sounds.  Pulmonary/Chest: Effort normal and breath sounds normal.  Abdominal: Soft. Bowel sounds are normal. She exhibits no distension. There is no tenderness. There is no guarding.  Obese abdomen  Musculoskeletal: Normal range of motion. She exhibits no edema.       Lumbar  back: She exhibits tenderness.       Back:  Tenderness noted. Callus noted to plantar surface of feet bilaterally  Neurological: She is alert and oriented to person, place, and time. Coordination normal.  Skin: Skin is warm and dry. Capillary refill takes less than 2 seconds.    Labs reviewed: Basic Metabolic Panel: Recent Labs    07/06/17 0523 07/16/17 1436 12/18/17 1525  NA 138 138 139  K 3.6 4.8 4.2  CL 106 99 102  CO2 23 30 31   GLUCOSE 113* 142* 149*  BUN 23* 12 14  CREATININE 0.67 0.88 0.70  CALCIUM 7.8* 10.1 9.8   Liver Function Tests: Recent Labs    07/05/17 0917 07/16/17 1436 12/18/17 1525  AST 53* 34 29  ALT 42 38* 33  ALKPHOS 71  --  102  BILITOT 0.9 0.8 0.5  PROT 6.4* 6.9 6.7  ALBUMIN 3.8  --  4.0   Recent Labs    07/05/17 0917 07/06/17 0523  LIPASE 274* 29   No results for input(s): AMMONIA in the last 8760 hours. CBC: Recent Labs    07/05/17 0917 07/06/17 0523 07/16/17 1436 12/18/17 1525  WBC 20.2* 8.8 11.1* 8.3  NEUTROABS 17.7*  --  8,702* 3.4  HGB 15.7* 12.9 14.7 14.8  HCT 46.6* 39.6 41.6 43.7  MCV 88.8 89.8 84.9 84.8  PLT 257 232 381 240.0   Lipid Panel: Recent Labs    07/16/17 1436  CHOL 206*  HDL 47*  LDLCALC 127*  TRIG 201*  CHOLHDL 4.4   TSH: No results for input(s): TSH in the last 8760 hours. A1C: Lab Results  Component Value Date   HGBA1C 5.9 (H) 07/16/2017  Assessment/Plan 1. Gastroesophageal reflux disease without esophagitis Stable on nexium with dietary modification.   2. Asthmatic bronchitis without complication, unspecified asthma severity, unspecified whether persistent Went to ED 3 days ago for viral URI, symptoms still present but without worsening. Overall feeling better. To use mucinex DM by mouth twice daily to help with cough and congestion. To continue advair and to use albuterol PRN.  -follow up recommended with pulmonary.   3. Chronic left-sided low back pain without sciatica Ongoing  pain, using rubs and heat which minimal effect Due to liver dx can use tylenol but no more than 2 gm daily, educated on dosing, will see if this helps pain.  DDD noted on multiple levels on xray Weight loss encouraged to help with multiple co-morbidies.  -offered PT but not interested at this time.   4. Hepatic cirrhosis, unspecified hepatic cirrhosis type, unspecified whether ascites present [facility]) Following with GI, recently with addition of propanolol, tolerating well.   5. Hyperlipidemia, unspecified hyperlipidemia type Did not wish to start cholesterol medication in the past. States that she has wanted to make dietary changes. Will follow up lab. - Lipid Panel  6. Essential hypertension -stable, tolerating propranolol.  - CBC with Differential/Platelets - COMPLETE METABOLIC PANEL WITH GFR  7. Hyperglycemia - Hemoglobin A1c  8. Corn or callus -causing increase pain, would like them "cut" out, will refer to podiatry  - Ambulatory referral to Podiatry  Next appt: 4 month follow up, sooner if needed  Hanalei Glace K. Stryker, [facility] [phone]

## 2018-03-31 LAB — COMPLETE METABOLIC PANEL WITH GFR
AG RATIO: 1.7 (calc) (ref 1.0–2.5)
ALBUMIN MSPROF: 4.2 g/dL (ref 3.6–5.1)
ALT: 65 U/L — ABNORMAL HIGH (ref 6–29)
AST: 53 U/L — ABNORMAL HIGH (ref 10–35)
Alkaline phosphatase (APISO): 92 U/L (ref 33–130)
BILIRUBIN TOTAL: 0.6 mg/dL (ref 0.2–1.2)
BUN: 14 mg/dL (ref 7–25)
CO2: 31 mmol/L (ref 20–32)
Calcium: 10 mg/dL (ref 8.6–10.4)
Chloride: 102 mmol/L (ref 98–110)
Creat: 0.73 mg/dL (ref 0.60–0.93)
GFR, EST AFRICAN AMERICAN: 91 mL/min/{1.73_m2} (ref >=60)
GFR, EST NON AFRICAN AMERICAN: 78 mL/min/{1.73_m2} (ref >=60)
GLOBULIN: 2.5 g/dL (ref 1.9–3.7)
Glucose, Bld: 116 mg/dL — ABNORMAL HIGH (ref 65–99)
POTASSIUM: 4.8 mmol/L (ref 3.5–5.3)
SODIUM: 140 mmol/L (ref 135–146)
TOTAL PROTEIN: 6.7 g/dL (ref 6.1–8.1)

## 2018-03-31 LAB — LIPID PANEL
CHOLESTEROL: 193 mg/dL (ref <200)
HDL: 40 mg/dL — ABNORMAL LOW (ref >50)
LDL CHOLESTEROL (CALC): 117 mg/dL — AB
Non-HDL Cholesterol (Calc): 153 mg/dL (calc) — ABNORMAL HIGH (ref <130)
Total CHOL/HDL Ratio: 4.8 (calc) (ref <5.0)
Triglycerides: 238 mg/dL — ABNORMAL HIGH (ref <150)

## 2018-03-31 LAB — CBC WITH DIFFERENTIAL/PLATELET
Basophils Absolute: 57 cells/uL (ref 0–200)
Basophils Relative: 0.7 %
EOS ABS: 138 {cells}/uL (ref 15–500)
Eosinophils Relative: 1.7 %
HEMATOCRIT: 44.4 % (ref 35.0–45.0)
HEMOGLOBIN: 15.1 g/dL (ref 11.7–15.5)
LYMPHS ABS: 3580 {cells}/uL (ref 850–3900)
MCH: 28.5 pg (ref 27.0–33.0)
MCHC: 34 g/dL (ref 32.0–36.0)
MCV: 83.8 fL (ref 80.0–100.0)
MPV: 10.7 fL (ref 7.5–12.5)
Monocytes Relative: 9.7 %
Neutro Abs: 3540 cells/uL (ref 1500–7800)
Neutrophils Relative %: 43.7 %
PLATELETS: 293 10*3/uL (ref 140–400)
RBC: 5.3 10*6/uL — ABNORMAL HIGH (ref 3.80–5.10)
RDW: 12.7 % (ref 11.0–15.0)
TOTAL LYMPHOCYTE: 44.2 %
WBC: 8.1 10*3/uL (ref 3.8–10.8)
WBCMIX: 786 {cells}/uL (ref 200–950)

## 2018-03-31 LAB — HEMOGLOBIN A1C
HEMOGLOBIN A1C: 6.2 %{Hb} — AB (ref <5.7)
Mean Plasma Glucose: 131 (calc)
eAG (mmol/L): 7.3 (calc)

## 2018-04-16 ENCOUNTER — Encounter: Payer: Self-pay | Admitting: Podiatry

## 2018-04-16 ENCOUNTER — Ambulatory Visit: Payer: PPO | Admitting: Podiatry

## 2018-04-16 VITALS — BP 153/85

## 2018-04-16 DIAGNOSIS — M79671 Pain in right foot: Secondary | ICD-10-CM | POA: Diagnosis not present

## 2018-04-16 DIAGNOSIS — M79672 Pain in left foot: Secondary | ICD-10-CM | POA: Diagnosis not present

## 2018-04-16 DIAGNOSIS — L84 Corns and callosities: Secondary | ICD-10-CM | POA: Diagnosis not present

## 2018-04-16 NOTE — Patient Instructions (Signed)
Corns and Calluses Corns are small areas of thickened skin that occur on the top, sides, or tip of a toe. They contain a cone-shaped core with a point that can press on a nerve below. This causes pain. Calluses are areas of thickened skin that can occur anywhere on the body including hands, fingers, palms, soles of the feet, and heels.Calluses are usually larger than corns. What are the causes? Corns and calluses are caused by rubbing (friction) or pressure, such as from shoes that are too tight or do not fit properly. What increases the risk? Corns are more likely to develop in people who have toe deformities, such as hammer toes. Since calluses can occur with friction to any area of the skin, calluses are more likely to develop in people who:  Work with their hands.  Wear shoes that fit poorly, shoes that are too tight, or shoes that are high-heeled.  Have toes deformities.  What are the signs or symptoms? Symptoms of a corn or callus include:  A hard growth on the skin.  Pain or tenderness under the skin.  Redness and swelling.  Increased discomfort while wearing tight-fitting shoes.  How is this diagnosed? Corns and calluses may be diagnosed with a medical history and physical exam. How is this treated? Corns and calluses may be treated with:  Removing the cause of the friction or pressure. This may include: ? Changing your shoes. ? Wearing shoe inserts (orthotics) or other protective layers in your shoes, such as a corn pad. ? Wearing gloves.  Medicines to help soften skin in the hardened, thickened areas.  Reducing the size of the corn or callus by removing the dead layers of skin.  Antibiotic medicines to treat infection.  Surgery, if a toe deformity is the cause.  Follow these instructions at home:  Take medicines only as directed by your health care provider.  If you were prescribed an antibiotic, finish all of it even if you start to feel better.  Wear  shoes that fit well. Avoid wearing high-heeled shoes and shoes that are too tight or too loose.  Wear any padding, protective layers, gloves, or orthotics as directed by your health care provider.  Soak your hands or feet and then use a file or pumice stone to soften your corn or callus. Do this as directed by your health care provider.  Check your corn or callus every day for signs of infection. Watch for: ? Redness, swelling, or pain. ? Fluid, blood, or pus. Contact a health care provider if:  Your symptoms do not improve with treatment.  You have increased redness, swelling, or pain at the site of your corn or callus.  You have fluid, blood, or pus coming from your corn or callus.  You have new symptoms. This information is not intended to replace advice given to you by your health care provider. Make sure you discuss any questions you have with your health care provider. Document Released: 02/03/2004 Document Revised: 11/17/2015 Document Reviewed: 04/25/2014 Elsevier Interactive Patient Education  [employer].

## 2018-04-17 ENCOUNTER — Ambulatory Visit (INDEPENDENT_AMBULATORY_CARE_PROVIDER_SITE_OTHER): Payer: PPO | Admitting: Gastroenterology

## 2018-04-17 DIAGNOSIS — Z23 Encounter for immunization: Secondary | ICD-10-CM

## 2018-05-14 ENCOUNTER — Other Ambulatory Visit: Payer: Self-pay | Admitting: Nurse Practitioner

## 2018-05-16 ENCOUNTER — Encounter: Payer: Self-pay | Admitting: Podiatry

## 2018-05-16 ENCOUNTER — Other Ambulatory Visit: Payer: Self-pay | Admitting: Nurse Practitioner

## 2018-05-16 NOTE — Progress Notes (Signed)
Subjective: [name] presents today with diabetes, diabetic neuropathy and cc of painful, discolored, thick toenails which interfere with daily activities and routine tasks. Pain is aggravated when wearing enclosed shoe gear. Pain is getting progressively worse. Patient is seeking professional help regarding symptoms.  Past Medical History:  Diagnosis Date  . ALLERGIC RHINITIS   . Anxiety   . Asthma   . Blood type O+   . Cervical strain, acute   . Cirrhosis ([place])   . Colon polyp 2010   adenoma  . Diverticulosis of colon   . Dizziness   . Esophageal varices ([place])   . Fibromyalgia   . GERD (gastroesophageal reflux disease)   . History of nephrolithiasis   . Hypercholesterolemia    borderline  . Hypertension   . IBS (irritable bowel syndrome)   . Osteoarthritis   . Personal history of allergy to unspecified medicinal agent   . Postconcussion syndrome   . Vitamin D deficiency     Patient Active Problem List   Diagnosis Date Noted  . Pancreatitis 07/05/2017  . Chronic pansinusitis 12/02/2016  . Asthma, allergic, mild intermittent, uncomplicated 10/93/2355  . Environmental and seasonal allergies 12/02/2016  . Obesity (BMI 30.0-34.9) 12/02/2016  . Allergic rhinitis 10/15/2016  . Presbyopia of both eyes 06/27/2016  . Pseudophakia 06/27/2016  . Urine frequency 06/26/2016  . Obese 06/26/2016  . Hyperglycemia 11/13/2015  . Numbness and tingling of both legs below knees 05/24/2014  . Dupuytren's contracture of both hands 01/18/2014  . Dyslipidemia 10/08/2011  . Asthmatic bronchitis 04/25/2011  . Acute sinusitis, unspecified 12/20/2010  . Back pain 07/12/2010  . NEPHROLITHIASIS 04/26/2009  . Vitamin D deficiency 10/30/2008  . COLONIC POLYPS 10/22/2007  . Diverticulosis of colon 10/22/2007  . Postconcussion syndrome 07/10/2007  . Seasonal and perennial allergic rhinitis 06/13/2007  . GERD 06/13/2007  . IBS 06/13/2007  . FIBROMYALGIA 06/13/2007  . PERSONAL HISTORY  ALLERGY UNSPEC MEDICINAL AGENT 06/13/2007  . Anxiety state 05/04/2007  . Essential hypertension 05/04/2007  . Osteoarthritis 05/04/2007  . DIZZINESS 04/28/2007    Past Surgical History:  Procedure Laterality Date  . CHOLECYSTECTOMY, LAPAROSCOPIC  2004   for gallstone pancreatitis  . KNEE SURGERY Right   . KNEE SURGERY Left   . THYROID SURGERY       Current Outpatient Medications:  .  ADVAIR HFA 115-21 MCG/ACT inhaler, Inhale 2 puffs into the lungs 2 (two) times daily. , Disp: , Rfl:  .  albuterol (PROVENTIL HFA;VENTOLIN HFA) 108 (90 Base) MCG/ACT inhaler, Inhale 2 puffs every 4 (four) hours as needed into the lungs for wheezing or shortness of breath. RESCUE, Disp: 3 Inhaler, Rfl: 1 .  aspirin 81 MG EC tablet, Take 81 mg by mouth daily.  , Disp: , Rfl:  .  benzonatate (TESSALON PERLES) 100 MG capsule, Take 1 capsule (100 mg total) by mouth 3 (three) times daily as needed for cough., Disp: 30 capsule, Rfl: 1 .  Calcium Carbonate-Vitamin D (CALCIUM 600+D) 600-400 MG-UNIT tablet, Take 1 tablet by mouth 2 (two) times daily., Disp: , Rfl:  .  Cetirizine HCl (ZYRTEC ALLERGY) 10 MG CAPS, Take 10 mg by mouth daily. , Disp: , Rfl:  .  Cholecalciferol (VITAMIN D3) 1000 UNITS CAPS, Take 1,000 Units by mouth daily. , Disp: , Rfl:  .  esomeprazole (NEXIUM) 40 MG capsule, Take 1 capsule (40 mg total) by mouth daily., Disp: 30 capsule, Rfl: 6 .  hydrochlorothiazide (MICROZIDE) 12.5 MG capsule, TAKE 1 CAPSULE BY MOUTH EVERY DAY,  Disp: 90 capsule, Rfl: 1 .  KLOR-CON M20 20 MEQ tablet, TAKE 1 TABLET BY MOUTH EVERY DAY, Disp: 90 tablet, Rfl: 1 .  meclizine (ANTIVERT) 25 MG tablet, TAKE 1 TABLET 3 TIMES A DAY AS NEEDED FOR DIZZINESS/NAUSEA, Disp: 90 tablet, Rfl: 0 .  Multiple Vitamins-Minerals (CENTRUM SILVER PO), Take 1 tablet by mouth daily. , Disp: , Rfl:  .  Omega-3 Fatty Acids (FISH OIL MAXIMUM STRENGTH) 1200 MG CAPS, Take 1,200 mg by mouth 2 (two) times daily. , Disp: , Rfl:  .  propranolol  (INDERAL) 10 MG tablet, TAKE 1 TAB BY MOUTH 2 TIMES DAILY. PLEASE CALL us AFTER 30 DAYS TO LET us KNOW HOW YOU ARE DOING., Disp: 60 tablet, Rfl: 1 .  Propylene Glycol (SYSTANE BALANCE) 0.6 % SOLN, Place 1 drop into both ears 2 (two) times daily. (Patient taking differently: Place 1 drop into both eyes 2 (two) times daily. ), Disp: 15 mL, Rfl: 0 .  SALINE NASAL SPRAY NA, Place 1 spray into both nostrils 2 (two) times daily. , Disp: , Rfl:  .  fluticasone (FLONASE) 50 MCG/ACT nasal spray, SHAKE LIQUID AND USE ONE TO TWO SPRAYS IN EACH NOSTRIL TWICE DAILY, Disp: 16 g, Rfl: 3  Allergies  Allergen Reactions  . Ace Inhibitors   . Amoxicillin Itching    REACTION: unknown? pain in right kidney  . Benicar Hct [Olmesartan Medoxomil-Hctz]     Extreme weakness  . Budesonide-Formoterol Fumarate     Causes tremors and numbness  . Chlorzoxazone [Chlorzoxazone]   . Citalopram Hydrobromide     REACTION: hives  . Citalopram Hydrobromide   . Clonidine Derivatives   . Droperidol     REACTION: hives  . Flexeril [Cyclobenzaprine Hcl]   . Fluconazole Nausea And Vomiting and Other (See Comments)    fatigue  . Ketorolac Tromethamine     REACTION: hives  . Ketorolac Tromethamine   . Metoclopramide Hcl   . Mometasone Furo-Formoterol Fum     Causes sore throat, blurred vision and unable to sleep--started on med 09-17-10  . Morphine     REACTION: hives and itching  . Olmesartan Medoxomil     REACTION: fatique  . Breo Ellipta [Fluticasone Furoate-Vilanterol] Itching    Pt reports itching with Memory Dance is related to being lactose intolerant.     Social History   Occupational History  . Occupation: retired    Fish farm manager: [employer]    Comment: Presenter, broadcasting  Tobacco Use  . Smoking status: Former Smoker    Packs/day: 2.00    Years: 8.00    Pack years: 16.00    Types: Cigarettes    Last attempt to quit: 05/13/1962    Years since quitting: 56.0  . Smokeless tobacco: Never Used   Substance and Sexual Activity  . Alcohol use: No  . Drug use: No  . Sexual activity: Not Currently    Birth control/protection: Post-menopausal    Family History  Problem Relation Age of Onset  . Breast cancer Mother   . Alzheimer's disease Mother   . Heart disease Mother   . COPD Brother   . Heart disease Sister   . Breast cancer Sister 27  . Alcohol abuse Sister   . Ovarian cancer Paternal Grandmother   . Arthritis Other        Cousins   . COPD Cousin        Maternal side   . Heart attack Maternal [employer]  History  Administered Date(s) Administered  . Hepatitis A, Adult 10/15/2017, 04/17/2018  . Influenza Split 02/11/2011, 02/25/2012  . Influenza Whole 02/10/2009, 02/26/2010  . Influenza, High Dose Seasonal PF 04/24/2017, 01/26/2018, 01/26/2018  . Influenza,inj,Quad PF,6+ Mos 02/09/2014, 03/20/2015, 02/27/2016  . Influenza-Unspecified 03/13/2013, 03/13/2017  . Pneumococcal Conjugate-13 02/27/2016  . Pneumococcal Polysaccharide-23 08/25/2014  . Tdap 12/06/2013     Review of systems: Positive Findings in bold print.  Constitutional:  chills, fatigue, fever, sweats, weight change Communication: Optometrist, sign Ecologist, hand writing, iPad/Android device Eyes: diplopia, glare,  light sensitivity, eyeglasses, blindness Ears nose mouth throat: Hard of hearing, deaf, sign language,  vertigo,   bloody nose,  rhinitis,  cold sores, snoring Cardiovascular: HTN, edema, arrhythmia, pacemaker in place, defibrillator in place,  chest pain/tightness, chronic anticoagulation, blood clot Respiratory:  difficulty breathing, denies congestion, SOB, wheezing, cough, asthma Gastrointestinal: abdominal pain, diarrhea, nausea, vomiting, reflux Genitourinary:  nocturia,  pain on urination,  blood in urine, Foley catheter, urinary urgency Musculoskeletal: Uses mobility aid,  cramping, stiff joints, painful joints,  Skin: changes in toenails, color change dryness,  itchy skin, mole changes, or rash  Neurological: numbness, paresthesias, burning in feet, denies fainting,  seizure, change in speech. denies headaches, memory problems/poor historian, cerebral palsy Endocrine: diabetes, hypothyroidism, hyperthyroidism,  dry mouth, flushing, denies heat intolerance,  cold intolerance,  excessive thirst, denies polyuria,  nocturia Hematological:  easy bleeding,  excessive bleeding, easy bruising, enlarged lymph nodes, on long term blood thinner Allergy/immunological:  hive, frequent infections, multiple drug allergies, seasonal allergies,  Psychiatric:  anxiety, depression, mood disorder, suicidal ideations, hallucinations   Objective: Vitals:   04/16/18 1053  BP: (!) 153/85   Vascular Examination: Capillary refill time <3 seconds x 10 digits Dorsalis pedis and posterior tibial pulses present b/l No digital hair x 10 digits Skin temperature warm to warm b/l  Dermatological Examination: Skin with normal turgor, texture and tone  Toenails 1-5 well maintained b/l.  Hyperkeratotic lesion submet head 3 left, submet head 2 right with tenderness to palpation. No edema, no erythema, no drainage, no flocculence.  Musculoskeletal: Muscle strength 5/5 to all LE muscle groups  Neurological: Sensation intact with 10 gram monofilament. Vibratory sensation intact.  Assessment: Painful calluses submet head 3 left foot, submet head 2 right foot  Plan: 1. Discussed calluses and treatment plans available. Pt consented to paring of calluses submet head 3 left foot and submet head 2 right foot. 2. Patient to continue soft, supportive shoe gear 3. Patient to report any pedal injuries to medical professional immediately. 4. Follow up 3 months. Patient/POA to call should there be a concern in the interim.

## 2018-05-17 ENCOUNTER — Other Ambulatory Visit: Payer: Self-pay | Admitting: Nurse Practitioner

## 2018-05-18 NOTE — Telephone Encounter (Signed)
Medication no longer on formulary,cheaper alternatives are as follows  Metolazone 34m                      2.537m                     54m22mChlorthalidone 254m9m0mg1mld you like to change to a medication on the formulary?

## 2018-06-17 ENCOUNTER — Other Ambulatory Visit: Payer: Self-pay | Admitting: Gastroenterology

## 2018-06-18 ENCOUNTER — Encounter: Payer: Self-pay | Admitting: Nurse Practitioner

## 2018-06-18 ENCOUNTER — Ambulatory Visit (INDEPENDENT_AMBULATORY_CARE_PROVIDER_SITE_OTHER): Payer: PPO | Admitting: Nurse Practitioner

## 2018-06-18 ENCOUNTER — Telehealth: Payer: Self-pay

## 2018-06-18 VITALS — BP 130/70 | HR 80 | Temp 97.6°F | Ht 63.0 in | Wt 196.0 lb

## 2018-06-18 DIAGNOSIS — R945 Abnormal results of liver function studies: Secondary | ICD-10-CM

## 2018-06-18 DIAGNOSIS — K7460 Unspecified cirrhosis of liver: Secondary | ICD-10-CM

## 2018-06-18 DIAGNOSIS — M545 Low back pain, unspecified: Secondary | ICD-10-CM

## 2018-06-18 DIAGNOSIS — I10 Essential (primary) hypertension: Secondary | ICD-10-CM | POA: Diagnosis not present

## 2018-06-18 DIAGNOSIS — K219 Gastro-esophageal reflux disease without esophagitis: Secondary | ICD-10-CM | POA: Diagnosis not present

## 2018-06-18 DIAGNOSIS — E785 Hyperlipidemia, unspecified: Secondary | ICD-10-CM | POA: Diagnosis not present

## 2018-06-18 DIAGNOSIS — G8929 Other chronic pain: Secondary | ICD-10-CM | POA: Diagnosis not present

## 2018-06-18 DIAGNOSIS — F419 Anxiety disorder, unspecified: Secondary | ICD-10-CM

## 2018-06-18 DIAGNOSIS — R7989 Other specified abnormal findings of blood chemistry: Secondary | ICD-10-CM

## 2018-06-18 DIAGNOSIS — J45909 Unspecified asthma, uncomplicated: Secondary | ICD-10-CM

## 2018-06-18 LAB — CBC WITH DIFFERENTIAL/PLATELET
ABSOLUTE MONOCYTES: 696 {cells}/uL (ref 200–950)
BASOS PCT: 0.7 %
Basophils Absolute: 61 cells/uL (ref 0–200)
Eosinophils Absolute: 113 cells/uL (ref 15–500)
Eosinophils Relative: 1.3 %
HCT: 45.5 % — ABNORMAL HIGH (ref 35.0–45.0)
Hemoglobin: 15.3 g/dL (ref 11.7–15.5)
Lymphs Abs: 3419 cells/uL (ref 850–3900)
MCH: 29.1 pg (ref 27.0–33.0)
MCHC: 33.6 g/dL (ref 32.0–36.0)
MCV: 86.5 fL (ref 80.0–100.0)
MONOS PCT: 8 %
MPV: 10.7 fL (ref 7.5–12.5)
Neutro Abs: 4411 cells/uL (ref 1500–7800)
Neutrophils Relative %: 50.7 %
PLATELETS: 311 10*3/uL (ref 140–400)
RBC: 5.26 10*6/uL — AB (ref 3.80–5.10)
RDW: 13 % (ref 11.0–15.0)
TOTAL LYMPHOCYTE: 39.3 %
WBC: 8.7 10*3/uL (ref 3.8–10.8)

## 2018-06-18 LAB — COMPLETE METABOLIC PANEL WITH GFR
AG Ratio: 1.6 (calc) (ref 1.0–2.5)
ALBUMIN MSPROF: 4.2 g/dL (ref 3.6–5.1)
ALT: 96 U/L — AB (ref 6–29)
AST: 76 U/L — ABNORMAL HIGH (ref 10–35)
Alkaline phosphatase (APISO): 94 U/L (ref 37–153)
BILIRUBIN TOTAL: 0.6 mg/dL (ref 0.2–1.2)
BUN: 20 mg/dL (ref 7–25)
CALCIUM: 9.9 mg/dL (ref 8.6–10.4)
CO2: 30 mmol/L (ref 20–32)
CREATININE: 0.79 mg/dL (ref 0.60–0.93)
Chloride: 103 mmol/L (ref 98–110)
GFR, EST AFRICAN AMERICAN: 83 mL/min/{1.73_m2} (ref >=60)
GFR, EST NON AFRICAN AMERICAN: 71 mL/min/{1.73_m2} (ref >=60)
GLUCOSE: 107 mg/dL — AB (ref 65–99)
Globulin: 2.6 g/dL (calc) (ref 1.9–3.7)
Potassium: 4.4 mmol/L (ref 3.5–5.3)
Sodium: 141 mmol/L (ref 135–146)
TOTAL PROTEIN: 6.8 g/dL (ref 6.1–8.1)

## 2018-06-18 LAB — LIPID PANEL
CHOL/HDL RATIO: 4.4 (calc) (ref <5.0)
Cholesterol: 201 mg/dL — ABNORMAL HIGH (ref <200)
HDL: 46 mg/dL — ABNORMAL LOW (ref >=50)
LDL CHOLESTEROL (CALC): 118 mg/dL — AB
NON-HDL CHOLESTEROL (CALC): 155 mg/dL — AB (ref <130)
TRIGLYCERIDES: 261 mg/dL — AB (ref <150)

## 2018-06-18 NOTE — Progress Notes (Signed)
Careteam: Patient Care Team: Carla Chandler, NP as PCP - General (Nurse Practitioner)  Advanced Directive information    Allergies  Allergen Reactions  . Ace Inhibitors   . Amoxicillin Itching    REACTION: unknown? pain in right kidney  . Benicar Hct [Olmesartan Medoxomil-Hctz]     Extreme weakness  . Budesonide-Formoterol Fumarate     Causes tremors and numbness  . Chlorzoxazone [Chlorzoxazone]   . Citalopram Hydrobromide     REACTION: hives  . Citalopram Hydrobromide   . Clonidine Derivatives   . Droperidol     REACTION: hives  . Flexeril [Cyclobenzaprine Hcl]   . Fluconazole Nausea And Vomiting and Other (See Comments)    fatigue  . Ketorolac Tromethamine     REACTION: hives  . Ketorolac Tromethamine   . Metoclopramide Hcl   . Mometasone Furo-Formoterol Fum     Causes sore throat, blurred vision and unable to sleep--started on med 09-17-10  . Morphine     REACTION: hives and itching  . Olmesartan Medoxomil     REACTION: fatique  . Breo Ellipta [Fluticasone Furoate-Vilanterol] Itching    Pt reports itching with [place] is related to being lactose intolerant.     Chief Complaint  Patient presents with  . Medical Management of Chronic Issues    41mh follow-up     HPI: Patient is a 78y.o. female seen in the office today for routine follow up.   Asthma-.  Using advair 2 puffs twice daily and albuterol PRN. It was recommended that she follow up with pulmonary after 10/1 appt but she has not because she felt better. Still has not followed up- was recommended after last OV. Constant cough and sinus issues. Hay fever and allergies with GERD which makes cough worse.   GERD- taking nexium daily    Pt followed up with GI due to cirrhosis of the liver. It was thought it the past that she could not tolerated propranolol however she was switched to low dose 10 mg BID to see if she could tolerate while stopping norvasc. Has been tolerating propranolol at this time.      OA of back and knees- reports arthritis is very painful. Uses aspercream cream but this is not effective. Hard to take care of herself. Did not wish to have PT. was okayed to take minimal tylenol and she is using this twice daily.   Went to podiatrist who removed callus on bilateral feet and they feel much better.   Does not feel the need to make changes with diet or exercise. Feels like "im not going to live very long" wants to live with her chronic issues.  Reports anxiety but states she has a strong faith. It has been recommended for her to start medication multiple times but she has never wanted. Feels like she has a good community.   Increase urinary urgency. Using pads. No pain with urination but frequency and urgency. Denies low abdominal.  Review of Systems:  Review of Systems  Constitutional: Negative for chills, fever and malaise/fatigue.  HENT: Positive for congestion and hearing loss. Negative for ear pain and sore throat.   Respiratory: Positive for cough and sputum production (clear). Negative for shortness of breath and wheezing.   Cardiovascular: Negative for chest pain, palpitations and leg swelling.  Gastrointestinal: Positive for heartburn. Negative for abdominal pain, constipation, diarrhea, nausea and vomiting.  Genitourinary: Positive for frequency. Negative for dysuria, flank pain, hematuria and urgency.  Musculoskeletal: Positive for back  pain and joint pain. Negative for falls.  Skin: Negative.   Neurological: Negative for dizziness.  Psychiatric/Behavioral: The patient is nervous/anxious.     Past Medical History:  Diagnosis Date  . ALLERGIC RHINITIS   . Anxiety   . Asthma   . Blood type O+   . Cervical strain, acute   . Cirrhosis ([place])   . Colon polyp 2010   adenoma  . Diverticulosis of colon   . Dizziness   . Esophageal varices ([place])   . Fibromyalgia   . GERD (gastroesophageal reflux disease)   . History of nephrolithiasis   . Hypercholesterolemia     borderline  . Hypertension   . IBS (irritable bowel syndrome)   . Osteoarthritis   . Personal history of allergy to unspecified medicinal agent   . Postconcussion syndrome   . Vitamin D deficiency    Past Surgical History:  Procedure Laterality Date  . CHOLECYSTECTOMY, LAPAROSCOPIC  2004   for gallstone pancreatitis  . KNEE SURGERY Right   . KNEE SURGERY Left   . THYROID SURGERY     Social History:   reports that she quit smoking about 56 years ago. Her smoking use included cigarettes. She has a 16.00 pack-year smoking history. She has never used smokeless tobacco. She reports that she does not drink alcohol or use drugs.  Family History  Problem Relation Age of Onset  . Breast cancer Mother   . Alzheimer's disease Mother   . Heart disease Mother   . COPD Brother   . Heart disease Sister   . Breast cancer Sister 10  . Alcohol abuse Sister   . Ovarian cancer Paternal Grandmother   . Arthritis Other        Cousins   . COPD Cousin        Maternal side   . Heart attack Maternal Uncle     Medications: Patient's Medications  New Prescriptions   No medications on file  Previous Medications   ADVAIR HFA 115-21 MCG/ACT INHALER    Inhale 2 puffs into the lungs 2 (two) times daily.    ALBUTEROL (PROVENTIL HFA;VENTOLIN HFA) 108 (90 BASE) MCG/ACT INHALER    Inhale 2 puffs every 4 (four) hours as needed into the lungs for wheezing or shortness of breath. RESCUE   ASPIRIN 81 MG EC TABLET    Take 81 mg by mouth daily.     BENZONATATE (TESSALON PERLES) 100 MG CAPSULE    Take 1 capsule (100 mg total) by mouth 3 (three) times daily as needed for cough.   CALCIUM CARBONATE-VITAMIN D (CALCIUM 600+D) 600-400 MG-UNIT TABLET    Take 1 tablet by mouth 2 (two) times daily.   CETIRIZINE HCL (ZYRTEC ALLERGY) 10 MG CAPS    Take 10 mg by mouth daily.    CHOLECALCIFEROL (VITAMIN D3) 1000 UNITS CAPS    Take 1,000 Units by mouth daily.    ESOMEPRAZOLE (NEXIUM) 40 MG CAPSULE    Take 1 capsule (40 mg  total) by mouth daily.   FLUTICASONE (FLONASE) 50 MCG/ACT NASAL SPRAY    SHAKE LIQUID AND USE ONE TO TWO SPRAYS IN EACH NOSTRIL TWICE DAILY   HYDROCHLOROTHIAZIDE (MICROZIDE) 12.5 MG CAPSULE    TAKE 1 CAPSULE BY MOUTH EVERY DAY   KLOR-CON M20 20 MEQ TABLET    TAKE 1 TABLET BY MOUTH EVERY DAY   MECLIZINE (ANTIVERT) 25 MG TABLET    TAKE 1 TABLET 3 TIMES A DAY AS NEEDED FOR DIZZINESS/NAUSEA   MULTIPLE VITAMINS-MINERALS (  CENTRUM SILVER PO)    Take 1 tablet by mouth daily.    OMEGA-3 FATTY ACIDS (FISH OIL MAXIMUM STRENGTH) 1200 MG CAPS    Take 1,200 mg by mouth 2 (two) times daily.    PROPRANOLOL (INDERAL) 10 MG TABLET    TAKE 1 TAB BY MOUTH 2 TIMES DAILY. PLEASE CALL us AFTER 30 DAYS TO LET us KNOW HOW YOU ARE DOING.   PROPYLENE GLYCOL (SYSTANE BALANCE) 0.6 % SOLN    Place 1 drop into both ears 2 (two) times daily.   SALINE NASAL SPRAY NA    Place 1 spray into both nostrils 2 (two) times daily.   Modified Medications   No medications on file  Discontinued Medications   No medications on file     Physical Exam:  Vitals:   06/18/18 0840  BP: 130/70  Pulse: 80  Temp: 97.6 F (36.4 C)  TempSrc: Oral  SpO2: 94%  Weight: 196 lb (88.9 kg)  Height: 5' 3"  (1.6 m)   Body mass index is 34.72 kg/m.  Physical Exam Constitutional:      Appearance: She is well-developed.  HENT:     Head: Normocephalic and atraumatic.  Eyes:     Pupils: Pupils are equal, round, and reactive to light.  Neck:     Musculoskeletal: Normal range of motion and neck supple.  Cardiovascular:     Rate and Rhythm: Normal rate and regular rhythm.     Heart sounds: Normal heart sounds.  Pulmonary:     Effort: Pulmonary effort is normal.     Breath sounds: Normal breath sounds.  Abdominal:     General: Bowel sounds are normal. There is no distension.     Palpations: Abdomen is soft.     Tenderness: There is no abdominal tenderness. There is no guarding.     Comments: Obese abdomen  Musculoskeletal: Normal range of  motion.     Lumbar back: She exhibits tenderness.       Back:     Comments: Tenderness noted.  Skin:    General: Skin is warm and dry.     Capillary Refill: Capillary refill takes less than 2 seconds.  Neurological:     Mental Status: She is alert and oriented to person, place, and time.     Coordination: Coordination normal.     Labs reviewed: Basic Metabolic Panel: Recent Labs    07/16/17 1436 12/18/17 1525 03/30/18 1106  NA 138 139 140  K 4.8 4.2 4.8  CL 99 102 102  CO2 30 31 31   GLUCOSE 142* 149* 116*  BUN 12 14 14   CREATININE 0.88 0.70 0.73  CALCIUM 10.1 9.8 10.0   Liver Function Tests: Recent Labs    07/05/17 0917 07/16/17 1436 12/18/17 1525 03/30/18 1106  AST 53* 34 29 53*  ALT 42 38* 33 65*  ALKPHOS 71  --  102  --   BILITOT 0.9 0.8 0.5 0.6  PROT 6.4* 6.9 6.7 6.7  ALBUMIN 3.8  --  4.0  --    Recent Labs    07/05/17 0917 07/06/17 0523  LIPASE 274* 29   No results for input(s): AMMONIA in the last 8760 hours. CBC: Recent Labs    07/16/17 1436 12/18/17 1525 03/30/18 1106  WBC 11.1* 8.3 8.1  NEUTROABS 8,702* 3.4 3,540  HGB 14.7 14.8 15.1  HCT 41.6 43.7 44.4  MCV 84.9 84.8 83.8  PLT 381 240.0 293   Lipid Panel: Recent Labs    07/16/17 1436  03/30/18 1106  CHOL 206* 193  HDL 47* 40*  LDLCALC 127* 117*  TRIG 201* 238*  CHOLHDL 4.4 4.8   TSH: No results for input(s): TSH in the last 8760 hours. A1C: Lab Results  Component Value Date   HGBA1C 6.2 (H) 03/30/2018     Assessment/Plan 1. Gastroesophageal reflux disease without esophagitis Ongoing, diet modifications and weight loss encouraged. Continues on nexium   2. Asthmatic bronchitis without complication, unspecified asthma severity, unspecified whether persistent Stable, continues on adviar and albuterol PRN  3. Chronic left-sided low back pain without sciatica Stable, uses muscle rub and low dose tylenol. Encouraged weight loss and to increase physical activity as  tolerates  4. Hepatic cirrhosis, unspecified hepatic cirrhosis type, unspecified whether ascites present ([place]) -continues on propranolol, tolerating well.  - COMPLETE METABOLIC PANEL WITH GFR - CBC with Differential/Platelets  5. Hyperlipidemia, unspecified hyperlipidemia type -did not wish to start statin in the past, recently with elevated liver enzymes due to cirrhosis. Encouraged dietary modification (low fat diet) will follow up today - COMPLETE METABOLIC PANEL WITH GFR - Lipid Panel  6. Essential hypertension -controlled, will continue current regimen  7. Anxiety -has not wished to start medication in the past. Has strong faith and community. Would like to increase physical activity (as tolerated) to see if this will help.   Next appt: 4 months, sooner if needed  Yanis Juma K. Colome, [facility] [phone]

## 2018-06-18 NOTE — Patient Instructions (Addendum)
To make appt with allergy clinic   Start walking TODAY, any amount of activity you can do will be beneficial   benefiber (can get at pharmacy) will bulk up stool  Heart-Healthy Eating Plan Many factors influence your heart (coronary) health, including eating and exercise habits. Coronary risk increases with abnormal blood fat (lipid) levels. Heart-healthy meal planning includes limiting unhealthy fats, increasing healthy fats, and making other diet and lifestyle changes. What are tips for following this plan? Cooking Cook foods using methods other than frying. Baking, boiling, grilling, and broiling are all good options. Other ways to reduce fat include:  Removing the skin from poultry.  Removing all visible fats from meats.  Steaming vegetables in water or broth. Meal planning   At meals, imagine dividing your plate into fourths: ? Fill one-half of your plate with vegetables and green salads. ? Fill one-fourth of your plate with whole grains. ? Fill one-fourth of your plate with lean protein foods.  Eat 4-5 servings of vegetables per day. One serving equals 1 cup raw or cooked vegetable, or 2 cups raw leafy greens.  Eat 4-5 servings of fruit per day. One serving equals 1 medium whole fruit,  cup dried fruit,  cup fresh, frozen, or canned fruit, or  cup 100% fruit juice.  Eat more foods that contain soluble fiber. Examples include apples, broccoli, carrots, beans, peas, and barley. Aim to get 25-30 g of fiber per day.  Increase your consumption of legumes, nuts, and seeds to 4-5 servings per week. One serving of dried beans or legumes equals  cup cooked, 1 serving of nuts is  cup, and 1 serving of seeds equals 1 tablespoon. Fats  Choose healthy fats more often. Choose monounsaturated and polyunsaturated fats, such as olive and canola oils, flaxseeds, walnuts, almonds, and seeds.  Eat more omega-3 fats. Choose salmon, mackerel, sardines, tuna, flaxseed oil, and ground  flaxseeds. Aim to eat fish at least 2 times each week.  Check food labels carefully to identify foods with trans fats or high amounts of saturated fat.  Limit saturated fats. These are found in animal products, such as meats, butter, and cream. Plant sources of saturated fats include palm oil, palm kernel oil, and coconut oil.  Avoid foods with partially hydrogenated oils in them. These contain trans fats. Examples are stick margarine, some tub margarines, cookies, crackers, and other baked goods.  Avoid fried foods. General information  Eat more home-cooked food and less restaurant, buffet, and fast food.  Limit or avoid alcohol.  Limit foods that are high in starch and sugar.  Lose weight if you are overweight. Losing just 5-10% of your body weight can help your overall health and prevent diseases such as diabetes and heart disease.  Monitor your salt (sodium) intake, especially if you have high blood pressure. Talk with your health care provider about your sodium intake.  Try to incorporate more vegetarian meals weekly. What foods can I eat? Fruits All fresh, canned (in natural juice), or frozen fruits. Vegetables Fresh or frozen vegetables (raw, steamed, roasted, or grilled). Green salads. Grains Most grains. Choose whole wheat and whole grains most of the time. Rice and pasta, including brown rice and pastas made with whole wheat. Meats and other proteins Lean, well-trimmed beef, veal, pork, and lamb. Chicken and [place] without skin. All fish and shellfish. Wild duck, rabbit, pheasant, and venison. Egg whites or low-cholesterol egg substitutes. Dried beans, peas, lentils, and tofu. Seeds and most nuts. Dairy Low-fat or nonfat cheeses,  including ricotta and mozzarella. Skim or 1% milk (liquid, powdered, or evaporated). Buttermilk made with low-fat milk. Nonfat or low-fat yogurt. Fats and oils Non-hydrogenated (trans-free) margarines. Vegetable oils, including soybean, sesame,  sunflower, olive, peanut, safflower, corn, canola, and cottonseed. Salad dressings or mayonnaise made with a vegetable oil. Beverages Water (mineral or sparkling). Coffee and tea. Diet carbonated beverages. Sweets and desserts Sherbet, gelatin, and fruit ice. Small amounts of dark chocolate. Limit all sweets and desserts. Seasonings and condiments All seasonings and condiments. The items listed above may not be a complete list of foods and beverages you can eat. Contact a dietitian for more options. What foods are not recommended? Fruits Canned fruit in heavy syrup. Fruit in cream or butter sauce. Fried fruit. Limit coconut. Vegetables Vegetables cooked in cheese, cream, or butter sauce. Fried vegetables. Grains Breads made with saturated or trans fats, oils, or whole milk. Croissants. Sweet rolls. Donuts. High-fat crackers, such as cheese crackers. Meats and other proteins Fatty meats, such as hot dogs, ribs, sausage, bacon, rib-eye roast or steak. High-fat deli meats, such as salami and bologna. Caviar. Domestic duck and goose. Organ meats, such as liver. Dairy Cream, sour cream, cream cheese, and creamed cottage cheese. Whole milk cheeses. Whole or 2% milk (liquid, evaporated, or condensed). Whole buttermilk. Cream sauce or high-fat cheese sauce. Whole-milk yogurt. Fats and oils Meat fat, or shortening. Cocoa butter, hydrogenated oils, palm oil, coconut oil, palm kernel oil. Solid fats and shortenings, including bacon fat, salt pork, lard, and butter. Nondairy cream substitutes. Salad dressings with cheese or sour cream. Beverages Regular sodas and any drinks with added sugar. Sweets and desserts Frosting. Pudding. Cookies. Cakes. Pies. Milk chocolate or white chocolate. Buttered syrups. Full-fat ice cream or ice cream drinks. The items listed above may not be a complete list of foods and beverages to avoid. Contact a dietitian for more information. Summary  Heart-healthy meal  planning includes limiting unhealthy fats, increasing healthy fats, and making other diet and lifestyle changes.  Lose weight if you are overweight. Losing just 5-10% of your body weight can help your overall health and prevent diseases such as diabetes and heart disease.  Focus on eating a balance of foods, including fruits and vegetables, low-fat or nonfat dairy, lean protein, nuts and legumes, whole grains, and heart-healthy oils and fats. This information is not intended to replace advice given to you by your health care provider. Make sure you discuss any questions you have with your health care provider. Document Released: 02/06/2008 Document Revised: 06/06/2017 Document Reviewed: 06/06/2017 Elsevier Interactive Patient Education  2019 [employer].

## 2018-06-18 NOTE — Telephone Encounter (Signed)
Orders entered

## 2018-06-18 NOTE — Telephone Encounter (Signed)
-----   Message from Roetta Sessions, [place] sent at 12/16/2017  1:35 PM EDT ----- Regarding: U/S and AFP due in Feb Repeat U/S and AFP due in Feb 2020 for cirrhosis

## 2018-06-19 NOTE — Telephone Encounter (Signed)
Called and spoke to pt. Scheduled her for RUQ U/S at [facility] on Thursday 2-13 at 8:30am to arrive at 8:15 am, NPO after midnight.  Pt understands to go the lab for AFP after appt.

## 2018-06-22 ENCOUNTER — Encounter: Payer: Self-pay | Admitting: Gastroenterology

## 2018-06-25 ENCOUNTER — Other Ambulatory Visit (INDEPENDENT_AMBULATORY_CARE_PROVIDER_SITE_OTHER): Payer: PPO

## 2018-06-25 ENCOUNTER — Ambulatory Visit (HOSPITAL_COMMUNITY)
Admission: RE | Admit: 2018-06-25 | Discharge: 2018-06-25 | Disposition: A | Discharge status: Home / Self Care | Payer: PPO | Source: Ambulatory Visit | Attending: Gastroenterology | Admitting: Gastroenterology

## 2018-06-25 DIAGNOSIS — K7460 Unspecified cirrhosis of liver: Secondary | ICD-10-CM

## 2018-06-25 DIAGNOSIS — R945 Abnormal results of liver function studies: Secondary | ICD-10-CM

## 2018-06-25 DIAGNOSIS — K760 Fatty (change of) liver, not elsewhere classified: Secondary | ICD-10-CM

## 2018-06-25 DIAGNOSIS — E785 Hyperlipidemia, unspecified: Secondary | ICD-10-CM | POA: Diagnosis not present

## 2018-06-25 DIAGNOSIS — R935 Abnormal findings on diagnostic imaging of other abdominal regions, including retroperitoneum: Secondary | ICD-10-CM | POA: Diagnosis not present

## 2018-06-25 DIAGNOSIS — K8500 Idiopathic acute pancreatitis without necrosis or infection: Secondary | ICD-10-CM

## 2018-06-25 DIAGNOSIS — R7989 Other specified abnormal findings of blood chemistry: Secondary | ICD-10-CM

## 2018-06-25 LAB — COMPREHENSIVE METABOLIC PANEL
ALT: 91 U/L — ABNORMAL HIGH (ref 0–35)
AST: 69 U/L — ABNORMAL HIGH (ref 0–37)
Albumin: 4.4 g/dL (ref 3.5–5.2)
Alkaline Phosphatase: 89 U/L (ref 39–117)
BILIRUBIN TOTAL: 0.8 mg/dL (ref 0.2–1.2)
BUN: 20 mg/dL (ref 6–23)
CO2: 32 meq/L (ref 19–32)
Calcium: 9.9 mg/dL (ref 8.4–10.5)
Chloride: 103 mEq/L (ref 96–112)
Creatinine, Ser: 0.75 mg/dL (ref 0.40–1.20)
GFR: 74.39 mL/min (ref >60.00)
GLUCOSE: 110 mg/dL — AB (ref 70–99)
POTASSIUM: 4.4 meq/L (ref 3.5–5.1)
Sodium: 143 mEq/L (ref 135–145)
Total Protein: 6.8 g/dL (ref 6.0–8.3)

## 2018-06-26 LAB — IRON AND TIBC
Iron Saturation: 27 % (ref 15–55)
Iron: 79 ug/dL (ref 27–139)
TIBC: 298 ug/dL (ref 250–450)
UIBC: 219 ug/dL (ref 118–369)

## 2018-06-26 LAB — ANA: ANA: NEGATIVE

## 2018-06-26 LAB — AFP TUMOR MARKER: AFP-Tumor Marker: 4 ng/mL

## 2018-07-02 ENCOUNTER — Telehealth: Payer: Self-pay | Admitting: Gastroenterology

## 2018-07-02 NOTE — Telephone Encounter (Signed)
Patient has questions regarding medication, wants to know if she can take Ibuprofen 200 mg even with her liver problem

## 2018-07-02 NOTE — Telephone Encounter (Signed)
Pt has been notified and aware. She understands how to use the tylenol as recommended by Dr Havery Moros.

## 2018-07-02 NOTE — Telephone Encounter (Signed)
It is not recommended to use any NSAIDs in the setting of cirrhosis. Would certainly not use it routinely, could use a dose very rarely. Tylenol is the recommended medication for mild pain, but needs to use < 2gm / day of that if using it routinely. Thanks

## 2018-07-17 ENCOUNTER — Ambulatory Visit: Payer: PPO | Admitting: Podiatry

## 2018-07-20 ENCOUNTER — Other Ambulatory Visit: Payer: Self-pay | Admitting: Gastroenterology

## 2018-07-21 NOTE — Telephone Encounter (Signed)
Ok to refill per SA.

## 2018-07-22 ENCOUNTER — Encounter: Payer: PPO | Admitting: Family

## 2018-07-22 ENCOUNTER — Ambulatory Visit: Payer: PPO

## 2018-07-29 ENCOUNTER — Other Ambulatory Visit: Payer: Self-pay | Admitting: Nurse Practitioner

## 2018-07-31 ENCOUNTER — Other Ambulatory Visit: Payer: Self-pay | Admitting: Nurse Practitioner

## 2018-07-31 DIAGNOSIS — R059 Cough, unspecified: Secondary | ICD-10-CM

## 2018-07-31 DIAGNOSIS — R05 Cough: Secondary | ICD-10-CM

## 2018-07-31 DIAGNOSIS — J4520 Mild intermittent asthma, uncomplicated: Secondary | ICD-10-CM

## 2018-08-03 ENCOUNTER — Other Ambulatory Visit: Payer: Self-pay | Admitting: Nurse Practitioner

## 2018-08-06 ENCOUNTER — Other Ambulatory Visit: Payer: Self-pay

## 2018-08-06 ENCOUNTER — Telehealth (INDEPENDENT_AMBULATORY_CARE_PROVIDER_SITE_OTHER): Payer: PPO | Admitting: Gastroenterology

## 2018-08-06 DIAGNOSIS — K7460 Unspecified cirrhosis of liver: Secondary | ICD-10-CM | POA: Diagnosis not present

## 2018-08-06 DIAGNOSIS — K219 Gastro-esophageal reflux disease without esophagitis: Secondary | ICD-10-CM | POA: Diagnosis not present

## 2018-08-06 DIAGNOSIS — I8500 Esophageal varices without bleeding: Secondary | ICD-10-CM | POA: Diagnosis not present

## 2018-08-06 DIAGNOSIS — R05 Cough: Secondary | ICD-10-CM

## 2018-08-06 DIAGNOSIS — R059 Cough, unspecified: Secondary | ICD-10-CM

## 2018-08-06 NOTE — Progress Notes (Signed)
THIS ENCOUNTER IS A VIRTUAL VISIT DUE TO COVID-19 - PATIENT WAS NOT SEEN IN THE OFFICE. PATIENT HAS CONSENTED TO VIRTUAL VISIT / TELEMEDICINE VISIT   Location of patient: home Location of provider: office Name of referring provider: Sherrie Mustache  Time spent with patient: 16 minutes with patient, > 25 minutes reviewed records / orders / documentation   HPI :  78 y/o female here for a follow up visit for the following:  Cirrhosis - compensated. Diagnosed when she had incidental finding of esophageal varices on EGD done to evaluate history of possible Barrett's. Workup unrevealing based on imaging and labs. Immunized to hep A. On propranolol for varices treatment, tolerates 36m BID. There was a question of her tolerance of this in the past, and have kept her on lower dose. Generally doing well. No issues with edema or decompensations. Labs stable. Recent [place] stable changes. She asked about if she can use tylenol or ibuprofen, which is safer for her.   GERD / cough - she has a history of asthma and has had some persistent cough along with some wheezing recently. She also has a history of GERD, using nexium 481monce daily which works fairly well during the day. She does sleep with head of bed elevated and uses some TUMS at night periodically for breakthrough. She asks whether GERD can be making her cough worse. No dysphagia. She has not seen pulmonary in some time.   AFP 4.0  [place]13/[place] - stable changes  EGD 06/25/2017 - No evidence of Barrett's, diminutive white plaques in the esophagus c/w candidiasis, suspected varices in mid esophagus, multiple gastric polyps, normal stomach / duodenum - fundic gland polyps  Colonoscopy 06/25/2017 - 9 small adenomas, afew small-mouthed diverticula were found in the sigmoid colon and ascending colon,Internal hemorrhoids were found during retroflexion. The hemorrhoids were moderate.  Colonoscopy 07/28/2008 - diverticulosis, one small adenoma,  biopsies taken to rule out microscopic colitis and negative EGD 9/209/2006 - 2cm segment of possible BE - bx show reflux changes only, no BE   Past Medical History:  Diagnosis Date  . ALLERGIC RHINITIS   . Anxiety   . Asthma   . Blood type O+   . Cervical strain, acute   . Cirrhosis ([place]  . Colon polyp 2010   adenoma  . Diverticulosis of colon   . Dizziness   . Esophageal varices ([place]  . Fibromyalgia   . GERD (gastroesophageal reflux disease)   . History of nephrolithiasis   . Hypercholesterolemia    borderline  . Hypertension   . IBS (irritable bowel syndrome)   . Osteoarthritis   . Personal history of allergy to unspecified medicinal agent   . Postconcussion syndrome   . Vitamin D deficiency      Past Surgical History:  Procedure Laterality Date  . CHOLECYSTECTOMY, LAPAROSCOPIC  2004   for gallstone pancreatitis  . KNEE SURGERY Right   . KNEE SURGERY Left   . THYROID SURGERY     Family History  Problem Relation Age of Onset  . Breast cancer Mother   . Alzheimer's disease Mother   . Heart disease Mother   . COPD Brother   . Heart disease Sister   . Breast cancer Sister 5042. Alcohol abuse Sister   . Ovarian cancer Paternal Grandmother   . Arthritis Other        Cousins   . COPD Cousin        Maternal side   .  Heart attack Maternal Uncle    Social History   Tobacco Use  . Smoking status: Former Smoker    Packs/day: 2.00    Years: 8.00    Pack years: 16.00    Types: Cigarettes    Last attempt to quit: 05/13/1962    Years since quitting: 56.2  . Smokeless tobacco: Never Used  Substance Use Topics  . Alcohol use: No  . Drug use: No   Current Outpatient Medications  Medication Sig Dispense Refill  . ADVAIR HFA 115-21 MCG/ACT inhaler Inhale 2 puffs into the lungs 2 (two) times daily.     Marland Kitchen aspirin 81 MG EC tablet Take 81 mg by mouth daily.      . benzonatate (TESSALON PERLES) 100 MG capsule Take 1 capsule (100 mg total) by mouth 3 (three) times  daily as needed for cough. 30 capsule 1  . Calcium Carbonate-Vitamin D (CALCIUM 600+D) 600-400 MG-UNIT tablet Take 1 tablet by mouth 2 (two) times daily.    . Cetirizine HCl (ZYRTEC ALLERGY) 10 MG CAPS Take 10 mg by mouth daily.     . Cholecalciferol (VITAMIN D3) 1000 UNITS CAPS Take 1,000 Units by mouth daily.     Marland Kitchen esomeprazole (NEXIUM) 40 MG capsule Take 1 capsule (40 mg total) by mouth daily. 30 capsule 6  . fluticasone (FLONASE) 50 MCG/ACT nasal spray SHAKE LIQUID AND USE ONE TO TWO SPRAYS IN EACH NOSTRIL TWICE DAILY 16 g 3  . hydrochlorothiazide (MICROZIDE) 12.5 MG capsule TAKE 1 CAPSULE BY MOUTH EVERY DAY 90 capsule 1  . KLOR-CON M20 20 MEQ tablet TAKE 1 TABLET BY MOUTH EVERY DAY 90 tablet 0  . meclizine (ANTIVERT) 25 MG tablet TAKE 1 TABLET 3 TIMES A DAY AS NEEDED FOR DIZZINESS/NAUSEA 90 tablet 0  . Multiple Vitamins-Minerals (CENTRUM SILVER PO) Take 1 tablet by mouth daily.     . Omega-3 Fatty Acids (FISH OIL MAXIMUM STRENGTH) 1200 MG CAPS Take 1,200 mg by mouth 2 (two) times daily.     Marland Kitchen PROAIR HFA 108 (90 Base) MCG/ACT inhaler INHALE 2 PUFFS EVERY 4 HOURS AS NEEDED INTO THE LUNGS FOR WHEEZING OR SHORTNESS OF BREATH 25.5 Inhaler 1  . propranolol (INDERAL) 10 MG tablet Take 1 tablet (10 mg total) by mouth 2 (two) times daily. 60 tablet 5  . Propylene Glycol (SYSTANE BALANCE) 0.6 % SOLN Place 1 drop into both ears 2 (two) times daily. 15 mL 0  . SALINE NASAL SPRAY NA Place 1 spray into both nostrils 2 (two) times daily.      No current facility-administered medications for this visit.    Allergies  Allergen Reactions  . Ace Inhibitors   . Amoxicillin Itching    REACTION: unknown? pain in right kidney  . Benicar Hct [Olmesartan Medoxomil-Hctz]     Extreme weakness  . Budesonide-Formoterol Fumarate     Causes tremors and numbness  . Chlorzoxazone [Chlorzoxazone]   . Citalopram Hydrobromide     REACTION: hives  . Citalopram Hydrobromide   . Clonidine Derivatives   . Droperidol      REACTION: hives  . Flexeril [Cyclobenzaprine Hcl]   . Fluconazole Nausea And Vomiting and Other (See Comments)    fatigue  . Ketorolac Tromethamine     REACTION: hives  . Ketorolac Tromethamine   . Metoclopramide Hcl   . Mometasone Furo-Formoterol Fum     Causes sore throat, blurred vision and unable to sleep--started on med 09-17-10  . Morphine     REACTION: hives and  itching  . Olmesartan Medoxomil     REACTION: fatique  . Breo Ellipta [Fluticasone Furoate-Vilanterol] Itching    Pt reports itching with Memory Dance is related to being lactose intolerant.      Review of Systems: All systems reviewed and negative except where noted in HPI.   Lab Results  Component Value Date   WBC 8.7 06/18/2018   HGB 15.3 06/18/2018   HCT 45.5 (H) 06/18/2018   MCV 86.5 06/18/2018   PLT 311 06/18/2018    Lab Results  Component Value Date   CREATININE 0.75 06/25/2018   BUN 20 06/25/2018   NA 143 06/25/2018   K 4.4 06/25/2018   CL 103 06/25/2018   CO2 32 06/25/2018    Lab Results  Component Value Date   ALT 91 (H) 06/25/2018   AST 69 (H) 06/25/2018   ALKPHOS 89 06/25/2018   BILITOT 0.8 06/25/2018     Physical Exam: NA  ASSESSMENT AND PLAN:  78 y/o female here for a follow up visit regarding:  Cirrhosis / esophageal varices - compensated with esophageal varices, on propranolol. Suspect this is due to NASH given prior workup to date which has been unremarkable. We have discussed risks of progression /decompensation / Isabella. She has been doing pretty well with this since diagnosis. Would continue propranolol for varices treatment, she does not warrant surveillance EGD for that. Due for repeat US in August for [facility] screening and labs at that time. She should avoid NSAIDs and can use low dose tylenol, < 2 gm / day for aches / pains if needed. She should continue to avoid alcohol. She agreed.  GERD / cough - unclear if her cough is due to her asthma / GERD, or both. Mostly well controlled  with nexium once daily but having some nocturnal symptoms. Would increase nexium to BID dosing for a few weeks to see if she notes a benefit to her cough. If this helps or resolves her cough, she can continue it at that dose. If cough persists despite the change would decrease back to once daily and follow up with pulmonary for asthma reassessment. She agreed.   Stotesbury Cellar, MD [facility]

## 2018-08-13 ENCOUNTER — Other Ambulatory Visit: Payer: Self-pay

## 2018-08-13 ENCOUNTER — Ambulatory Visit (INDEPENDENT_AMBULATORY_CARE_PROVIDER_SITE_OTHER): Payer: PPO | Admitting: Adult Health

## 2018-08-13 DIAGNOSIS — J4520 Mild intermittent asthma, uncomplicated: Secondary | ICD-10-CM | POA: Diagnosis not present

## 2018-08-13 MED ORDER — PREDNISONE 20 MG PO TABS: 20.0000 mg | ORAL_TABLET | Freq: Two times a day (BID) | ORAL | 0 refills | Status: AC

## 2018-08-13 MED ORDER — BENZONATATE 100 MG PO CAPS: 100.0000 mg | ORAL_CAPSULE | Freq: Three times a day (TID) | ORAL | 1 refills | Status: DC | PRN

## 2018-08-13 NOTE — Patient Instructions (Addendum)
Start taking Prednisone 20 mg 1 tablet twice a day with meals for 7 days and Tessalon perles 100 mg 1 capsule three times a day as needed. Will need to keep hydrated by drinking extra water daily (at least 5 glasses of water everday). Will need to use humidifier at home

## 2018-08-13 NOTE — Progress Notes (Signed)
This service is provided via telemedicine  No vital signs collected/recorded due to the encounter was a telemedicine visit.   Location of patient (ex: home, work):  Home  Patient consents to a telephone visit:  yes  Location of the provider (ex: office, home):  Office  Name of any referring provider: Durenda Age, NP  Names of all persons participating in the telemedicine service and their role in the encounter:    Time spent on call:  10 mins with Ruthell Rummage, CMA and      DATE:  08/13/2018 MRN:  [id number]  BIRTHDAY: Apr 08, 1941   Contact Information    Name Relation Home Work Mobile   MEDLEY,SUE Relative   Drexel Son [phone]        Code Status History    Date Active Date Inactive Code Status Order ID Comments User Context   08/14/2017 0901 03/28/2018 0010 DNR [id number]  Lauree Chandler, NP Outpatient   07/05/2017 1407 07/06/2017 1703 Full Code [id number]  Lady Deutscher, MD ED    Questions for Most Recent Historical Code Status (Order [id number])    Question Answer Comment   In the event of cardiac or respiratory ARREST Do not call a "code blue"    In the event of cardiac or respiratory ARREST Do not perform Intubation, CPR, defibrillation or ACLS    In the event of cardiac or respiratory ARREST Use medication by any route, position, wound care, and other measures to relive pain and suffering. May use oxygen, suction and manual treatment of airway obstruction as needed for comfort.        Chief Complaint  Patient presents with  . Acute Visit    Congestion, Hoarness along with wheezing, patient states this is something she has delt with and believes allergies   . Medication Management    Patient would like prednisone and patient states she took 1 mucinex tab       HISTORY OF PRESENT ILLNESS:  This is a 78 year old female who had a televisit today. She said that she woke-up this morning wheezing.She has  productive cough with whitish phlegm. She denies having fever, sore throat and SOB. She said that she usually have the "spring allergy" every year and takes Prednisone for several days. She takes her PRN albuterol and Advair. She has PMH of asthma, anxiety, cirrhosis and allergic rhinitis.   PAST MEDICAL HISTORY:  Past Medical History:  Diagnosis Date  . ALLERGIC RHINITIS   . Anxiety   . Asthma   . Blood type O+   . Cervical strain, acute   . Cirrhosis ([place])   . Colon polyp 2010   adenoma  . Diverticulosis of colon   . Dizziness   . Esophageal varices ([place])   . Fibromyalgia   . GERD (gastroesophageal reflux disease)   . History of nephrolithiasis   . Hypercholesterolemia    borderline  . Hypertension   . IBS (irritable bowel syndrome)   . Osteoarthritis   . Personal history of allergy to unspecified medicinal agent   . Postconcussion syndrome   . Vitamin D deficiency      CURRENT MEDICATIONS: Reviewed  Patient's Medications  New Prescriptions   No medications on file  Previous Medications   ADVAIR HFA 115-21 MCG/ACT INHALER    Inhale 2 puffs into the lungs 2 (two) times daily.    ASPIRIN 81 MG EC TABLET    Take 81 mg by mouth daily.  BENZONATATE (TESSALON PERLES) 100 MG CAPSULE    Take 1 capsule (100 mg total) by mouth 3 (three) times daily as needed for cough.   CALCIUM CARBONATE-VITAMIN D (CALCIUM 600+D) 600-400 MG-UNIT TABLET    Take 1 tablet by mouth 2 (two) times daily.   CETIRIZINE HCL (ZYRTEC ALLERGY) 10 MG CAPS    Take 10 mg by mouth daily.    CHOLECALCIFEROL (VITAMIN D3) 1000 UNITS CAPS    Take 1,000 Units by mouth daily.    ESOMEPRAZOLE (NEXIUM) 40 MG CAPSULE    Take 1 capsule (40 mg total) by mouth daily.   FLUTICASONE (FLONASE) 50 MCG/ACT NASAL SPRAY    SHAKE LIQUID AND USE ONE TO TWO SPRAYS IN EACH NOSTRIL TWICE DAILY   HYDROCHLOROTHIAZIDE (MICROZIDE) 12.5 MG CAPSULE    TAKE 1 CAPSULE BY MOUTH EVERY DAY   KLOR-CON M20 20 MEQ TABLET    TAKE 1 TABLET BY  MOUTH EVERY DAY   MECLIZINE (ANTIVERT) 25 MG TABLET    TAKE 1 TABLET 3 TIMES A DAY AS NEEDED FOR DIZZINESS/NAUSEA   MULTIPLE VITAMINS-MINERALS (CENTRUM SILVER PO)    Take 1 tablet by mouth daily.    OMEGA-3 FATTY ACIDS (FISH OIL MAXIMUM STRENGTH) 1200 MG CAPS    Take 1,200 mg by mouth 2 (two) times daily.    PROAIR HFA 108 (90 BASE) MCG/ACT INHALER    INHALE 2 PUFFS EVERY 4 HOURS AS NEEDED INTO THE LUNGS FOR WHEEZING OR SHORTNESS OF BREATH   PROPRANOLOL (INDERAL) 10 MG TABLET    Take 1 tablet (10 mg total) by mouth 2 (two) times daily.   PROPYLENE GLYCOL (SYSTANE BALANCE) 0.6 % SOLN    Place 1 drop into both ears 2 (two) times daily.   SALINE NASAL SPRAY NA    Place 1 spray into both nostrils 2 (two) times daily.   Modified Medications   No medications on file  Discontinued Medications   No medications on file     Allergies  Allergen Reactions  . Ace Inhibitors   . Amoxicillin Itching    REACTION: unknown? pain in right kidney  . Benicar Hct [Olmesartan Medoxomil-Hctz]     Extreme weakness  . Budesonide-Formoterol Fumarate     Causes tremors and numbness  . Chlorzoxazone [Chlorzoxazone]   . Citalopram Hydrobromide     REACTION: hives  . Citalopram Hydrobromide   . Clonidine Derivatives   . Droperidol     REACTION: hives  . Flexeril [Cyclobenzaprine Hcl]   . Fluconazole Nausea And Vomiting and Other (See Comments)    fatigue  . Ketorolac Tromethamine     REACTION: hives  . Ketorolac Tromethamine   . Metoclopramide Hcl   . Mometasone Furo-Formoterol Fum     Causes sore throat, blurred vision and unable to sleep--started on med 09-17-10  . Morphine     REACTION: hives and itching  . Olmesartan Medoxomil     REACTION: fatique  . Breo Ellipta [Fluticasone Furoate-Vilanterol] Itching    Pt reports itching with Memory Dance is related to being lactose intolerant.      REVIEW OF SYSTEMS:  GENERAL: no change in appetite, no fatigue, no weight changes, no fever, chills or  weakness MOUTH and THROAT: Denies oral discomfort, gingival pain or bleeding RESPIRATORY: +cough and wheezing CARDIAC: no chest pain, edema or palpitations GI: no abdominal pain, diarrhea, constipation, heart burn, nausea or vomiting GU: Denies dysuria, frequency, hematuria, incontinence, or discharge NEUROLOGICAL: Denies dizziness, syncope, numbness, or headache PSYCHIATRIC: Denies feeling of depression  or anxiety. No report of hallucinations, insomnia, paranoia, or agitation    LABS/RADIOLOGY: Labs reviewed: Basic Metabolic Panel: Recent Labs    03/30/18 1106 06/18/18 0947 06/25/18 0943  NA 140 141 143  K 4.8 4.4 4.4  CL 102 103 103  CO2 31 30 32  GLUCOSE 116* 107* 110*  BUN 14 20 20   CREATININE 0.73 0.79 0.75  CALCIUM 10.0 9.9 9.9   Liver Function Tests: Recent Labs    12/18/17 1525 03/30/18 1106 06/18/18 0947 06/25/18 0943  AST 29 53* 76* 69*  ALT 33 65* 96* 91*  ALKPHOS 102  --   --  89  BILITOT 0.5 0.6 0.6 0.8  PROT 6.7 6.7 6.8 6.8  ALBUMIN 4.0  --   --  4.4   CBC: Recent Labs    12/18/17 1525 03/30/18 1106 06/18/18 0947  WBC 8.3 8.1 8.7  NEUTROABS 3.4 3,540 4,411  HGB 14.8 15.1 15.3  HCT 43.7 44.4 45.5*  MCV 84.8 83.8 86.5  PLT 240.0 293 311   Lipid Panel: Recent Labs    03/30/18 1106 06/18/18 0947  HDL 40* 46*        ASSESSMENT/PLAN:  1. Asthma, allergic, mild intermittent, uncomplicated  - predniSONE (DELTASONE) 20 MG tablet; Take 1 tablet (20 mg total) by mouth 2 (two) times daily with a meal for 7 days.  Dispense: 14 tablet; Refill: 0 - benzonatate (TESSALON PERLES) 100 MG capsule; Take 1 capsule (100 mg total) by mouth 3 (three) times daily as needed for cough.  Dispense: 30 capsule; Refill: 1  - continue using Advair and Albuterol PRN  Will need to keep hydrated by drinking extra water daily (at least 5 glasses of water everday). Will need to use humidifier at home    Time spent on non face to face visit:  6 minutes    Durenda Age, NP [facility] [phone]

## 2018-08-14 ENCOUNTER — Ambulatory Visit: Payer: PPO | Admitting: Gastroenterology

## 2018-08-18 ENCOUNTER — Other Ambulatory Visit: Payer: Self-pay | Admitting: Gastroenterology

## 2018-08-18 DIAGNOSIS — K219 Gastro-esophageal reflux disease without esophagitis: Secondary | ICD-10-CM

## 2018-08-18 MED ORDER — ESOMEPRAZOLE MAGNESIUM 40 MG PO CPDR: 40.0000 mg | DELAYED_RELEASE_CAPSULE | Freq: Every day | ORAL | 6 refills | Status: DC

## 2018-08-18 NOTE — Telephone Encounter (Signed)
Refill for Nexium sent to patients pharmacy.

## 2018-08-18 NOTE — Telephone Encounter (Signed)
Pt requested a refill for Nexium 40 mg.

## 2018-08-20 ENCOUNTER — Telehealth: Payer: Self-pay | Admitting: Gastroenterology

## 2018-08-20 ENCOUNTER — Other Ambulatory Visit: Payer: Self-pay

## 2018-08-20 DIAGNOSIS — K219 Gastro-esophageal reflux disease without esophagitis: Secondary | ICD-10-CM

## 2018-08-20 MED ORDER — ESOMEPRAZOLE MAGNESIUM 40 MG PO CPDR: 40.0000 mg | DELAYED_RELEASE_CAPSULE | Freq: Two times a day (BID) | ORAL | 5 refills | Status: DC

## 2018-08-20 NOTE — Telephone Encounter (Signed)
Patient states due to Dr.Armbruster requesting she take medication nexium twice daily she has run out. Patient would like a new prescription called in to [facility] for 60 capsules instead of 30.

## 2018-08-23 ENCOUNTER — Other Ambulatory Visit: Payer: Self-pay | Admitting: Internal Medicine

## 2018-09-02 ENCOUNTER — Emergency Department (HOSPITAL_COMMUNITY)
Admission: EM | Admit: 2018-09-02 | Discharge: 2018-09-02 | Disposition: A | Discharge status: Home / Self Care | Payer: PPO | Attending: Emergency Medicine | Admitting: Emergency Medicine

## 2018-09-02 ENCOUNTER — Encounter (HOSPITAL_COMMUNITY): Payer: Self-pay

## 2018-09-02 ENCOUNTER — Emergency Department (HOSPITAL_COMMUNITY): Payer: PPO

## 2018-09-02 ENCOUNTER — Encounter (HOSPITAL_COMMUNITY): Payer: Self-pay | Admitting: Emergency Medicine

## 2018-09-02 ENCOUNTER — Emergency Department (HOSPITAL_COMMUNITY)
Admission: EM | Admit: 2018-09-02 | Discharge: 2018-09-03 | Disposition: A | Discharge status: Home / Self Care | Payer: PPO | Source: Home / Self Care | Attending: Emergency Medicine | Admitting: Emergency Medicine

## 2018-09-02 ENCOUNTER — Other Ambulatory Visit: Payer: Self-pay

## 2018-09-02 DIAGNOSIS — Z7982 Long term (current) use of aspirin: Secondary | ICD-10-CM | POA: Insufficient documentation

## 2018-09-02 DIAGNOSIS — R040 Epistaxis: Secondary | ICD-10-CM | POA: Diagnosis not present

## 2018-09-02 DIAGNOSIS — R51 Headache: Principal | ICD-10-CM

## 2018-09-02 DIAGNOSIS — I10 Essential (primary) hypertension: Secondary | ICD-10-CM

## 2018-09-02 DIAGNOSIS — I517 Cardiomegaly: Secondary | ICD-10-CM | POA: Diagnosis not present

## 2018-09-02 DIAGNOSIS — J45909 Unspecified asthma, uncomplicated: Secondary | ICD-10-CM

## 2018-09-02 DIAGNOSIS — Z79899 Other long term (current) drug therapy: Secondary | ICD-10-CM

## 2018-09-02 DIAGNOSIS — Z9049 Acquired absence of other specified parts of digestive tract: Secondary | ICD-10-CM | POA: Diagnosis not present

## 2018-09-02 DIAGNOSIS — Z87891 Personal history of nicotine dependence: Secondary | ICD-10-CM

## 2018-09-02 DIAGNOSIS — J4520 Mild intermittent asthma, uncomplicated: Secondary | ICD-10-CM | POA: Insufficient documentation

## 2018-09-02 DIAGNOSIS — R519 Headache, unspecified: Secondary | ICD-10-CM

## 2018-09-02 LAB — CBC WITH DIFFERENTIAL/PLATELET
Abs Immature Granulocytes: 0.04 10*3/uL (ref 0.00–0.07)
Basophils Absolute: 0.1 10*3/uL (ref 0.0–0.1)
Basophils Relative: 1 %
Eosinophils Absolute: 0.3 10*3/uL (ref 0.0–0.5)
Eosinophils Relative: 4 %
HCT: 44.1 % (ref 36.0–46.0)
Hemoglobin: 14.7 g/dL (ref 12.0–15.0)
Immature Granulocytes: 0 %
Lymphocytes Relative: 43 %
Lymphs Abs: 4 10*3/uL (ref 0.7–4.0)
MCH: 29.2 pg (ref 26.0–34.0)
MCHC: 33.3 g/dL (ref 30.0–36.0)
MCV: 87.5 fL (ref 80.0–100.0)
Monocytes Absolute: 1.1 10*3/uL — ABNORMAL HIGH (ref 0.1–1.0)
Monocytes Relative: 12 %
Neutro Abs: 3.7 10*3/uL (ref 1.7–7.7)
Neutrophils Relative %: 40 %
Platelets: 268 10*3/uL (ref 150–400)
RBC: 5.04 MIL/uL (ref 3.87–5.11)
RDW: 13 % (ref 11.5–15.5)
WBC: 9.2 10*3/uL (ref 4.0–10.5)
nRBC: 0 % (ref 0.0–0.2)

## 2018-09-02 LAB — COMPREHENSIVE METABOLIC PANEL
ALT: 66 U/L — ABNORMAL HIGH (ref 0–44)
AST: 56 U/L — ABNORMAL HIGH (ref 15–41)
Albumin: 3.6 g/dL (ref 3.5–5.0)
Alkaline Phosphatase: 123 U/L (ref 38–126)
Anion gap: 13 (ref 5–15)
BUN: 17 mg/dL (ref 8–23)
CO2: 23 mmol/L (ref 22–32)
Calcium: 9.6 mg/dL (ref 8.9–10.3)
Chloride: 101 mmol/L (ref 98–111)
Creatinine, Ser: 0.8 mg/dL (ref 0.44–1.00)
GFR calc Af Amer: >60 mL/min (ref >60)
GFR calc non Af Amer: >60 mL/min (ref >60)
Glucose, Bld: 143 mg/dL — ABNORMAL HIGH (ref 70–99)
Potassium: 3.9 mmol/L (ref 3.5–5.1)
Sodium: 137 mmol/L (ref 135–145)
Total Bilirubin: 0.8 mg/dL (ref 0.3–1.2)
Total Protein: 6.3 g/dL — ABNORMAL LOW (ref 6.5–8.1)

## 2018-09-02 LAB — PROTIME-INR
INR: 0.9 (ref 0.8–1.2)
Prothrombin Time: 12.2 seconds (ref 11.4–15.2)

## 2018-09-02 MED ORDER — LIDOCAINE VISCOUS HCL 2 % MT SOLN: 15.0000 mL | Freq: Once | OROMUCOSAL | Status: DC

## 2018-09-02 MED ORDER — LIDOCAINE HCL URETHRAL/MUCOSAL 2 % EX GEL: 1.0000 "application " | Freq: Once | CUTANEOUS | Status: DC

## 2018-09-02 MED ORDER — LIDOCAINE HCL URETHRAL/MUCOSAL 2 % EX GEL
1.0000 "application " | Freq: Once | CUTANEOUS | Status: AC
  Administered 2018-09-02: 1 via TOPICAL
  Filled 2018-09-02: qty 20

## 2018-09-02 MED ORDER — OXYMETAZOLINE HCL 0.05 % NA SOLN
2.0000 | Freq: Once | NASAL | Status: AC
  Administered 2018-09-02: 2 via NASAL
  Filled 2018-09-02: qty 30

## 2018-09-02 MED ORDER — CLINDAMYCIN HCL 150 MG PO CAPS
300.0000 mg | ORAL_CAPSULE | Freq: Once | ORAL | Status: AC
  Administered 2018-09-03: 300 mg via ORAL
  Filled 2018-09-02: qty 2

## 2018-09-02 NOTE — ED Notes (Signed)
Patient verbalizes understanding of discharge instructions. Opportunity for questioning and answers were provided. Armband removed by staff, pt discharged from ED home via taxi.

## 2018-09-02 NOTE — Discharge Instructions (Signed)
Leave rhino rocket in place for now.  Try to avoid blowing your nose if you can. Follow-up with ENT-- call office in the morning to schedule appt.  They usually take these out in 48-72 hours. You can return here for any new/acute changes.

## 2018-09-02 NOTE — ED Provider Notes (Signed)
[facility] EMERGENCY DEPARTMENT Provider Note   CSN: [id number] Arrival date & time: 09/02/18  0154    History   Chief Complaint Chief Complaint  Patient presents with  . Epistaxis    HPI [name] is a 78 y.o. female.     The history is provided by the patient and medical records.  Epistaxis  Associated symptoms: headaches      78 y.o. F with hx of anxiety, asthma, cirrhosis, GERD, fibromyalgia, HLP, HTN, GERD, OA, IBS, Vit D deficiency, presenting to the ED for epistaxis.  States this began around 12:30AM when she was getting ready for bed.  Thinks it has been bleeding from left nostril but not entirely sure.  She does feel some blood in her mouth but denies sensation of any going down her throat.  She has not had any nausea or vomiting.  Has had issues with nosebleeds in the past but has been quite a while.  She has required cautery in the past with ENT but no prior sinus surgeries.  She takes daily aspirin but is not on any other anticoagulation.  She reports some mild headaches for the past few days, has been relieved with Tylenol.  She denies any new numbness, weakness, dizziness, confusion, difficulty walking, changes in speech, or blurred vision.  She is not had any falls or head trauma.  She denies any current lightheadedness or dizziness.  Of note, patient is hypertensive here.  States her usual blood pressure is around 140-150/90's.  She has not had any changes in her home medications.  States she has been a little worried and stressed out recently due to diagnosis of esophageal varices and possible complications from such.  States tonight she got concerned because she thought her varices were bleeding.  No chest pain or SOB.  No nausea/vomiting.  Past Medical History:  Diagnosis Date  . ALLERGIC RHINITIS   . Anxiety   . Asthma   . Blood type O+   . Cervical strain, acute   . Cirrhosis ([place])   . Colon polyp 2010   adenoma  . Diverticulosis  of colon   . Dizziness   . Esophageal varices ([place])   . Fibromyalgia   . GERD (gastroesophageal reflux disease)   . History of nephrolithiasis   . Hypercholesterolemia    borderline  . Hypertension   . IBS (irritable bowel syndrome)   . Osteoarthritis   . Personal history of allergy to unspecified medicinal agent   . Postconcussion syndrome   . Vitamin D deficiency     Patient Active Problem List   Diagnosis Date Noted  . Pancreatitis 07/05/2017  . Chronic pansinusitis 12/02/2016  . Asthma, allergic, mild intermittent, uncomplicated 74/04/8785  . Environmental and seasonal allergies 12/02/2016  . Obesity (BMI 30.0-34.9) 12/02/2016  . Allergic rhinitis 10/15/2016  . Presbyopia of both eyes 06/27/2016  . Pseudophakia 06/27/2016  . Urine frequency 06/26/2016  . Obese 06/26/2016  . Hyperglycemia 11/13/2015  . Numbness and tingling of both legs below knees 05/24/2014  . Dupuytren's contracture of both hands 01/18/2014  . Dyslipidemia 10/08/2011  . Asthmatic bronchitis 04/25/2011  . Acute sinusitis, unspecified 12/20/2010  . Back pain 07/12/2010  . NEPHROLITHIASIS 04/26/2009  . Vitamin D deficiency 10/30/2008  . COLONIC POLYPS 10/22/2007  . Diverticulosis of colon 10/22/2007  . Postconcussion syndrome 07/10/2007  . Seasonal and perennial allergic rhinitis 06/13/2007  . GERD 06/13/2007  . IBS 06/13/2007  . FIBROMYALGIA 06/13/2007  . PERSONAL HISTORY ALLERGY  UNSPEC MEDICINAL AGENT 06/13/2007  . Anxiety state 05/04/2007  . Essential hypertension 05/04/2007  . Osteoarthritis 05/04/2007  . DIZZINESS 04/28/2007    Past Surgical History:  Procedure Laterality Date  . CHOLECYSTECTOMY, LAPAROSCOPIC  2004   for gallstone pancreatitis  . KNEE SURGERY Right   . KNEE SURGERY Left   . THYROID SURGERY       OB History   No obstetric history on file.      Home Medications    Prior to Admission medications   Medication Sig Start Date End Date Taking? Authorizing Provider   ADVAIR HFA 115-21 MCG/ACT inhaler TAKE 2 PUFFS BY MOUTH TWICE A DAY 08/24/18   Lauree Chandler, NP  aspirin 81 MG EC tablet Take 81 mg by mouth daily.      [provider]  benzonatate (TESSALON PERLES) 100 MG capsule Take 1 capsule (100 mg total) by mouth 3 (three) times daily as needed for cough. 08/13/18   Medina-Vargas, Monina C, NP  Calcium Carbonate-Vitamin D (CALCIUM 600+D) 600-400 MG-UNIT tablet Take 1 tablet by mouth 2 (two) times daily.    [provider]  Cetirizine HCl (ZYRTEC ALLERGY) 10 MG CAPS Take 10 mg by mouth daily.     [provider]  Cholecalciferol (VITAMIN D3) 1000 UNITS CAPS Take 1,000 Units by mouth daily.     [provider]  esomeprazole (NEXIUM) 40 MG capsule Take 1 capsule (40 mg total) by mouth 2 (two) times daily before a meal. 08/20/18   Armbruster, Carlota Raspberry, MD  fluticasone (FLONASE) 50 MCG/ACT nasal spray SHAKE LIQUID AND USE ONE TO TWO SPRAYS IN EACH NOSTRIL TWICE DAILY 08/03/18   Lauree Chandler, NP  hydrochlorothiazide (MICROZIDE) 12.5 MG capsule TAKE 1 CAPSULE BY MOUTH EVERY DAY 05/18/18   Lauree Chandler, NP  KLOR-CON M20 20 MEQ tablet TAKE 1 TABLET BY MOUTH EVERY DAY 07/29/18   Lauree Chandler, NP  meclizine (ANTIVERT) 25 MG tablet TAKE 1 TABLET 3 TIMES A DAY AS NEEDED FOR DIZZINESS/NAUSEA 07/29/18   Lauree Chandler, NP  Multiple Vitamins-Minerals (CENTRUM SILVER PO) Take 1 tablet by mouth daily.     [provider]  Omega-3 Fatty Acids (FISH OIL MAXIMUM STRENGTH) 1200 MG CAPS Take 1,200 mg by mouth 2 (two) times daily.     [provider]  PROAIR HFA 108 (90 Base) MCG/ACT inhaler INHALE 2 PUFFS EVERY 4 HOURS AS NEEDED INTO THE LUNGS FOR WHEEZING OR SHORTNESS OF BREATH 07/31/18   Lauree Chandler, NP  propranolol (INDERAL) 10 MG tablet Take 1 tablet (10 mg total) by mouth 2 (two) times daily. Patient not taking: Reported on 08/13/2018 07/21/18   Yetta Flock, MD  Propylene Glycol (SYSTANE  BALANCE) 0.6 % SOLN Place 1 drop into both ears 2 (two) times daily. 11/27/17   Reed, Tiffany L, DO  SALINE NASAL SPRAY NA Place 1 spray into both nostrils 2 (two) times daily.     [provider]    Family History Family History  Problem Relation Age of Onset  . Breast cancer Mother   . Alzheimer's disease Mother   . Heart disease Mother   . COPD Brother   . Heart disease Sister   . Breast cancer Sister 60  . Alcohol abuse Sister   . Ovarian cancer Paternal Grandmother   . Arthritis Other        Cousins   . COPD Cousin        Maternal side   .  Heart attack Maternal Uncle     Social History Social History   Tobacco Use  . Smoking status: Former Smoker    Packs/day: 2.00    Years: 8.00    Pack years: 16.00    Types: Cigarettes    Last attempt to quit: 05/13/1962    Years since quitting: 56.3  . Smokeless tobacco: Never Used  Substance Use Topics  . Alcohol use: No  . Drug use: No     Allergies   Ace inhibitors; Amoxicillin; Benicar hct [olmesartan medoxomil-hctz]; Budesonide-formoterol fumarate; Chlorzoxazone [chlorzoxazone]; Citalopram hydrobromide; Citalopram hydrobromide; Clonidine derivatives; Droperidol; Flexeril [cyclobenzaprine hcl]; Fluconazole; Ketorolac tromethamine; Ketorolac tromethamine; Metoclopramide hcl; Mometasone furo-formoterol fum; Morphine; Olmesartan medoxomil; and Breo ellipta [fluticasone furoate-vilanterol]   Review of Systems Review of Systems  HENT: Positive for nosebleeds.   Neurological: Positive for headaches.  All other systems reviewed and are negative.    Physical Exam Updated Vital Signs BP (!) 206/102   Pulse 83   Temp 98.7 F (37.1 C) (Oral)   Resp 18   Ht 5' 1"  (1.549 m)   Wt 86.6 kg   SpO2 97%   BMI 36.09 kg/m   Physical Exam Vitals signs and nursing note reviewed.  Constitutional:      Appearance: She is well-developed.  HENT:     Head: Normocephalic and atraumatic.     Nose:     Comments: No active  bleeding from left nostril but does have some tried blood inside and along the philtrum; some blood noted in the mouth but none in posterior oropharynx Eyes:     Conjunctiva/sclera: Conjunctivae normal.     Pupils: Pupils are equal, round, and reactive to light.  Neck:     Musculoskeletal: Normal range of motion.  Cardiovascular:     Rate and Rhythm: Normal rate and regular rhythm.     Heart sounds: Normal heart sounds.  Pulmonary:     Effort: Pulmonary effort is normal.     Breath sounds: Normal breath sounds.  Abdominal:     General: Bowel sounds are normal.     Palpations: Abdomen is soft.  Musculoskeletal: Normal range of motion.  Skin:    General: Skin is warm and dry.  Neurological:     Mental Status: She is alert and oriented to person, place, and time.     Comments: AAOx3, answering questions and following commands appropriately; equal strength UE and LE bilaterally; CN grossly intact; moves all extremities appropriately without ataxia; no focal neuro deficits or facial asymmetry appreciated      ED Treatments / Results  Labs (all labs ordered are listed, but only abnormal results are displayed) Labs Reviewed  CBC WITH DIFFERENTIAL/PLATELET - Abnormal; Notable for the following components:      Result Value   Monocytes Absolute 1.1 (*)    All other components within normal limits  COMPREHENSIVE METABOLIC PANEL - Abnormal; Notable for the following components:   Glucose, Bld 143 (*)    Total Protein 6.3 (*)    AST 56 (*)    ALT 66 (*)    All other components within normal limits  PROTIME-INR    EKG None  Radiology Ct Head Wo Contrast  Result Date: 09/02/2018 CLINICAL DATA:  Headache EXAM: CT HEAD WITHOUT CONTRAST TECHNIQUE: Contiguous axial images were obtained from the base of the skull through the vertex without intravenous contrast. COMPARISON:  04/29/2007 FINDINGS: Brain: There is no mass, hemorrhage or extra-axial collection. The size and configuration of  the ventricles  and extra-axial CSF spaces are normal. There is hypoattenuation of the white matter, most commonly indicating chronic small vessel disease. Vascular: No abnormal hyperdensity of the major intracranial arteries or dural venous sinuses. No intracranial atherosclerosis. Skull: The visualized skull base, calvarium and extracranial soft tissues are normal. Sinuses/Orbits: Partial opacification of the left maxillary sinus with fluid level. The orbits are normal. IMPRESSION: 1. Chronic ischemic microangiopathy without acute intracranial abnormality. 2. Fluid level in left maxillary sinus. Correlate for symptoms of acute sinusitis. Electronically Signed   By: Ulyses Jarred M.D.   On: 09/02/2018 03:07    Procedures .Epistaxis Management Date/Time: 09/02/2018 3:55 AM Performed by: Larene Pickett, PA-C Authorized by: Larene Pickett, PA-C   Consent:    Consent obtained:  Verbal   Consent given by:  Patient   Risks discussed:  Bleeding   Alternatives discussed:  No treatment and delayed treatment Anesthesia (see MAR for exact dosages):    Anesthesia method:  Topical application   Topical anesthetic:  Lidocaine gel Procedure details:    Treatment site:  L anterior   Treatment complexity:  Limited   Treatment episode: recurring   Post-procedure details:    Assessment:  Bleeding stopped Comments:     Afrin used initially x2 with brief cessation, however then had recurrence.  4.5 rhinorocket placed with cessation of bleeding.   (including critical care time)  Medications Ordered in ED Medications  oxymetazoline (AFRIN) 0.05 % nasal spray 2 spray (2 sprays Left Nare Given 09/02/18 0219)  lidocaine (XYLOCAINE) 2 % jelly 1 application (1 application Topical Given by Other 09/02/18 0345)     Initial Impression / Assessment and Plan / ED Course  I have reviewed the triage vital signs and the nursing notes.  Pertinent labs & imaging results that were available during my care of the  patient were reviewed by me and considered in my medical decision making (see chart for details).  78 y.o. F here with nosebleed.  She reports distant history of same requiring cauterization.  No prior sinus or other nasal surgeries.  She is awake, alert, appropriately oriented here.  Neurologic exam is nonfocal.  Headache minor at this time.  Blood pressure is elevated but does admit she has been nervous recently over new diagnosis of esophageal varices.  She has no active bleeding from the left nostril at this time, there is dried blood in the left anterior nostril.  No blood noted in the posterior oropharynx.  She takes daily aspirin, no other anticoagulation.  She does have history of cirrhosis and elevated LFTs.  Will obtain screening labs including INR.  Will obtain CT of the head given headaches and elevated BP.  She is not having any chest pain or shortness of breath.  Given afrin.  Will monitor.  3:17 AM Patient with recurrence of bleeding.  Will give second trial of afrin.  CT scan reassuring.  Labs overall reassuring-- LFT's elevated but this appears baseline for her.  3:56 AM Patient with continued bleeding after second dose of Afrin.  Risk versus benefits of packing discussed with patient, given ongoing bleeding will proceed.  Lidocaine jelly applied to [employer] and placed in the left nostril without difficulty.  Patient tolerated very well.  Cessation of bleeding.  Will monitor here for a while to ensure no recurrent bleeding from around the balloon.  4:24 AM No recurrence of bleeding after placement of rhino rocket.  Patient tolerating well.  Her BP is improving without acute intervention.  Feel she is stable for discharge home.  She will need follow-up with ENT for removal-- given information for on call provider.  Instructed to avoid blowing nose if possible.  She can return here for any new/acute changes.  Final Clinical Impressions(s) / ED Diagnoses   Final diagnoses:   Epistaxis    ED Discharge Orders    None       Larene Pickett, PA-C 09/02/18 0435    Merryl Hacker, MD 09/02/18 [phone]

## 2018-09-02 NOTE — ED Triage Notes (Addendum)
Pt seen earlier today for epistaxis.  Returns for headache all day and not feeling well.  No neuro deficits noted on triage exam.

## 2018-09-02 NOTE — ED Triage Notes (Signed)
Pt arrived via [facility]; pt from [facility] with c/o nosebleed for 3mns; pt was getting ready for bed when it started; c/o HA intermittently for a month; denies blood thinners; 270/110; 80; no pulse ox; 97.9; no CBG

## 2018-09-02 NOTE — ED Notes (Signed)
Patient to CT.

## 2018-09-03 ENCOUNTER — Encounter (HOSPITAL_COMMUNITY): Payer: Self-pay | Admitting: Emergency Medicine

## 2018-09-03 ENCOUNTER — Other Ambulatory Visit: Payer: Self-pay

## 2018-09-03 ENCOUNTER — Emergency Department (HOSPITAL_COMMUNITY)
Admission: EM | Admit: 2018-09-03 | Discharge: 2018-09-03 | Disposition: A | Discharge status: Home / Self Care | Payer: PPO | Attending: Emergency Medicine | Admitting: Emergency Medicine

## 2018-09-03 DIAGNOSIS — Z79899 Other long term (current) drug therapy: Secondary | ICD-10-CM | POA: Diagnosis not present

## 2018-09-03 DIAGNOSIS — J45909 Unspecified asthma, uncomplicated: Secondary | ICD-10-CM | POA: Insufficient documentation

## 2018-09-03 DIAGNOSIS — Z87891 Personal history of nicotine dependence: Secondary | ICD-10-CM | POA: Diagnosis not present

## 2018-09-03 DIAGNOSIS — Z9049 Acquired absence of other specified parts of digestive tract: Secondary | ICD-10-CM | POA: Diagnosis not present

## 2018-09-03 DIAGNOSIS — R040 Epistaxis: Secondary | ICD-10-CM | POA: Insufficient documentation

## 2018-09-03 DIAGNOSIS — I10 Essential (primary) hypertension: Secondary | ICD-10-CM | POA: Insufficient documentation

## 2018-09-03 DIAGNOSIS — Z7982 Long term (current) use of aspirin: Secondary | ICD-10-CM | POA: Diagnosis not present

## 2018-09-03 MED ORDER — LIDOCAINE HCL URETHRAL/MUCOSAL 2 % EX GEL
1.0000 "application " | Freq: Once | CUTANEOUS | Status: DC
  Filled 2018-09-03: qty 20

## 2018-09-03 MED ORDER — AMLODIPINE BESYLATE 5 MG PO TABS
5.0000 mg | ORAL_TABLET | Freq: Once | ORAL | Status: AC
  Administered 2018-09-03: 5 mg via ORAL
  Filled 2018-09-03: qty 1

## 2018-09-03 MED ORDER — OXYMETAZOLINE HCL 0.05 % NA SOLN
1.0000 | Freq: Once | NASAL | Status: AC
  Administered 2018-09-03: 13:00:00 1 via NASAL
  Filled 2018-09-03: qty 30

## 2018-09-03 MED ORDER — PROMETHAZINE HCL 25 MG/ML IJ SOLN
12.5000 mg | Freq: Once | INTRAMUSCULAR | Status: AC
  Administered 2018-09-03: 12.5 mg via INTRAVENOUS
  Filled 2018-09-03: qty 1

## 2018-09-03 MED ORDER — PROPRANOLOL HCL 10 MG PO TABS
10.0000 mg | ORAL_TABLET | ORAL | Status: AC
  Administered 2018-09-03: 10 mg via ORAL
  Filled 2018-09-03: qty 1

## 2018-09-03 MED ORDER — ACETAMINOPHEN 500 MG PO TABS
1000.0000 mg | ORAL_TABLET | Freq: Once | ORAL | Status: AC
  Administered 2018-09-03: 1000 mg via ORAL
  Filled 2018-09-03: qty 2

## 2018-09-03 MED ORDER — CLINDAMYCIN HCL 300 MG PO CAPS: 300.0000 mg | ORAL_CAPSULE | Freq: Four times a day (QID) | ORAL | 0 refills | Status: DC

## 2018-09-03 MED ORDER — LIDOCAINE-EPINEPHRINE (PF) 2 %-1:200000 IJ SOLN
10.0000 mL | Freq: Once | INTRAMUSCULAR | Status: DC
  Filled 2018-09-03: qty 20

## 2018-09-03 NOTE — ED Notes (Signed)
ED Provider at bedside. 

## 2018-09-03 NOTE — ED Notes (Signed)
Patient verbalizes understanding of discharge instructions. Opportunity for questioning and answers were provided. Armband removed by staff, pt discharged from ED. Pt wheeled to lobby.

## 2018-09-03 NOTE — ED Triage Notes (Signed)
Pt arrives POV with rhino rocket in place, pt was seen last night for HA, still persists. Pt alert, oriented, and ambulatory with device. Pt reports bleeding started again this AM.

## 2018-09-03 NOTE — ED Provider Notes (Signed)
[facility] EMERGENCY DEPARTMENT Provider Note   CSN: [id number] Arrival date & time: 09/02/18  2141    History   Chief Complaint Chief Complaint  Patient presents with  . Headache    HPI [name] is a 78 y.o. female.     HPI  Past Medical History:  Diagnosis Date  . ALLERGIC RHINITIS   . Anxiety   . Asthma   . Blood type O+   . Cervical strain, acute   . Cirrhosis ([place])   . Colon polyp 2010   adenoma  . Diverticulosis of colon   . Dizziness   . Esophageal varices ([place])   . Fibromyalgia   . GERD (gastroesophageal reflux disease)   . History of nephrolithiasis   . Hypercholesterolemia    borderline  . Hypertension   . IBS (irritable bowel syndrome)   . Osteoarthritis   . Personal history of allergy to unspecified medicinal agent   . Postconcussion syndrome   . Vitamin D deficiency     Patient Active Problem List   Diagnosis Date Noted  . Pancreatitis 07/05/2017  . Chronic pansinusitis 12/02/2016  . Asthma, allergic, mild intermittent, uncomplicated 95/28/4132  . Environmental and seasonal allergies 12/02/2016  . Obesity (BMI 30.0-34.9) 12/02/2016  . Allergic rhinitis 10/15/2016  . Presbyopia of both eyes 06/27/2016  . Pseudophakia 06/27/2016  . Urine frequency 06/26/2016  . Obese 06/26/2016  . Hyperglycemia 11/13/2015  . Numbness and tingling of both legs below knees 05/24/2014  . Dupuytren's contracture of both hands 01/18/2014  . Dyslipidemia 10/08/2011  . Asthmatic bronchitis 04/25/2011  . Acute sinusitis, unspecified 12/20/2010  . Back pain 07/12/2010  . NEPHROLITHIASIS 04/26/2009  . Vitamin D deficiency 10/30/2008  . COLONIC POLYPS 10/22/2007  . Diverticulosis of colon 10/22/2007  . Postconcussion syndrome 07/10/2007  . Seasonal and perennial allergic rhinitis 06/13/2007  . GERD 06/13/2007  . IBS 06/13/2007  . FIBROMYALGIA 06/13/2007  . PERSONAL HISTORY ALLERGY UNSPEC MEDICINAL AGENT 06/13/2007  . Anxiety state  05/04/2007  . Essential hypertension 05/04/2007  . Osteoarthritis 05/04/2007  . DIZZINESS 04/28/2007    Past Surgical History:  Procedure Laterality Date  . CHOLECYSTECTOMY, LAPAROSCOPIC  2004   for gallstone pancreatitis  . KNEE SURGERY Right   . KNEE SURGERY Left   . THYROID SURGERY       OB History   No obstetric history on file.      Home Medications    Prior to Admission medications   Medication Sig Start Date End Date Taking? Authorizing Provider  ADVAIR HFA 115-21 MCG/ACT inhaler TAKE 2 PUFFS BY MOUTH TWICE A DAY 08/24/18   Lauree Chandler, NP  aspirin 81 MG EC tablet Take 81 mg by mouth daily.      [provider]  benzonatate (TESSALON PERLES) 100 MG capsule Take 1 capsule (100 mg total) by mouth 3 (three) times daily as needed for cough. 08/13/18   Medina-Vargas, Monina C, NP  Calcium Carbonate-Vitamin D (CALCIUM 600+D) 600-400 MG-UNIT tablet Take 1 tablet by mouth 2 (two) times daily.    [provider]  Cetirizine HCl (ZYRTEC ALLERGY) 10 MG CAPS Take 10 mg by mouth daily.     [provider]  Cholecalciferol (VITAMIN D3) 1000 UNITS CAPS Take 1,000 Units by mouth daily.     [provider]  esomeprazole (NEXIUM) 40 MG capsule Take 1 capsule (40 mg total) by mouth 2 (two) times daily before a meal. 08/20/18   Armbruster, Carlota Raspberry, MD  fluticasone (FLONASE) 50 MCG/ACT nasal spray SHAKE LIQUID AND USE ONE TO TWO SPRAYS IN EACH NOSTRIL TWICE DAILY 08/03/18   Lauree Chandler, NP  hydrochlorothiazide (MICROZIDE) 12.5 MG capsule TAKE 1 CAPSULE BY MOUTH EVERY DAY 05/18/18   Lauree Chandler, NP  KLOR-CON M20 20 MEQ tablet TAKE 1 TABLET BY MOUTH EVERY DAY 07/29/18   Lauree Chandler, NP  meclizine (ANTIVERT) 25 MG tablet TAKE 1 TABLET 3 TIMES A DAY AS NEEDED FOR DIZZINESS/NAUSEA 07/29/18   Lauree Chandler, NP  Multiple Vitamins-Minerals (CENTRUM SILVER PO) Take 1 tablet by mouth daily.     [provider]  Omega-3 Fatty Acids  (FISH OIL MAXIMUM STRENGTH) 1200 MG CAPS Take 1,200 mg by mouth 2 (two) times daily.     [provider]  PROAIR HFA 108 (90 Base) MCG/ACT inhaler INHALE 2 PUFFS EVERY 4 HOURS AS NEEDED INTO THE LUNGS FOR WHEEZING OR SHORTNESS OF BREATH 07/31/18   Lauree Chandler, NP  propranolol (INDERAL) 10 MG tablet Take 1 tablet (10 mg total) by mouth 2 (two) times daily. Patient not taking: Reported on 08/13/2018 07/21/18   Yetta Flock, MD  Propylene Glycol (SYSTANE BALANCE) 0.6 % SOLN Place 1 drop into both ears 2 (two) times daily. 11/27/17   Reed, Tiffany L, DO  SALINE NASAL SPRAY NA Place 1 spray into both nostrils 2 (two) times daily.     [provider]    Family History Family History  Problem Relation Age of Onset  . Breast cancer Mother   . Alzheimer's disease Mother   . Heart disease Mother   . COPD Brother   . Heart disease Sister   . Breast cancer Sister 80  . Alcohol abuse Sister   . Ovarian cancer Paternal Grandmother   . Arthritis Other        Cousins   . COPD Cousin        Maternal side   . Heart attack Maternal Uncle     Social History Social History   Tobacco Use  . Smoking status: Former Smoker    Packs/day: 2.00    Years: 8.00    Pack years: 16.00    Types: Cigarettes    Last attempt to quit: 05/13/1962    Years since quitting: 56.3  . Smokeless tobacco: Never Used  Substance Use Topics  . Alcohol use: No  . Drug use: No     Allergies   Ace inhibitors; Amoxicillin; Benicar hct [olmesartan medoxomil-hctz]; Budesonide-formoterol fumarate; Chlorzoxazone [chlorzoxazone]; Citalopram hydrobromide; Citalopram hydrobromide; Clonidine derivatives; Droperidol; Flexeril [cyclobenzaprine hcl]; Fluconazole; Ketorolac tromethamine; Ketorolac tromethamine; Metoclopramide hcl; Mometasone furo-formoterol fum; Morphine; Olmesartan medoxomil; and Breo ellipta [fluticasone furoate-vilanterol]   Review of Systems Review of Systems   Physical Exam Updated  Vital Signs BP (!) 181/117   Pulse 80   Temp 98.3 F (36.8 C) (Oral)   Resp 16   SpO2 97%   Physical Exam   ED Treatments / Results  Labs (all labs ordered are listed, but only abnormal results are displayed) Labs Reviewed - No data to display  EKG EKG Interpretation  Date/Time:  Wednesday Keyler Hoge 22 2020 23:47:45 EDT Ventricular Rate:  78 PR Interval:    QRS Duration: 92 QT Interval:  404 QTC Calculation: 461 R Axis:   60 Text Interpretation:  Sinus rhythm Confirmed by Dory Horn) on 09/02/2018 11:51:00 PM   Radiology Ct Head Wo Contrast  Result Date: 09/02/2018 CLINICAL DATA:  Epistaxis EXAM: CT HEAD WITHOUT  CONTRAST TECHNIQUE: Contiguous axial images were obtained from the base of the skull through the vertex without intravenous contrast. COMPARISON:  Earlier today FINDINGS: Brain: There is atrophy and chronic small vessel disease changes. No acute intracranial abnormality. Specifically, no hemorrhage, hydrocephalus, mass lesion, acute infarction, or significant intracranial injury. Vascular: No hyperdense vessel or unexpected calcification. Skull: No acute calvarial abnormality. Sinuses/Orbits: Air-fluid level in the left maxillary sinus. Mastoid air cells clear. Other: None IMPRESSION: Atrophy, chronic microvascular disease. No acute intracranial abnormality. Air-fluid level in the left maxillary sinus Electronically Signed   By: Rolm Baptise M.D.   On: 09/02/2018 23:40   Ct Head Wo Contrast  Result Date: 09/02/2018 CLINICAL DATA:  Headache EXAM: CT HEAD WITHOUT CONTRAST TECHNIQUE: Contiguous axial images were obtained from the base of the skull through the vertex without intravenous contrast. COMPARISON:  04/29/2007 FINDINGS: Brain: There is no mass, hemorrhage or extra-axial collection. The size and configuration of the ventricles and extra-axial CSF spaces are normal. There is hypoattenuation of the white matter, most commonly indicating chronic small vessel  disease. Vascular: No abnormal hyperdensity of the major intracranial arteries or dural venous sinuses. No intracranial atherosclerosis. Skull: The visualized skull base, calvarium and extracranial soft tissues are normal. Sinuses/Orbits: Partial opacification of the left maxillary sinus with fluid level. The orbits are normal. IMPRESSION: 1. Chronic ischemic microangiopathy without acute intracranial abnormality. 2. Fluid level in left maxillary sinus. Correlate for symptoms of acute sinusitis. Electronically Signed   By: Ulyses Jarred M.D.   On: 09/02/2018 03:07    Procedures Procedures (including critical care time)  Medications Ordered in ED Medications  clindamycin (CLEOCIN) capsule 300 mg (300 mg Oral Given 09/03/18 0015)  propranolol (INDERAL) tablet 10 mg (10 mg Oral Given 09/03/18 0050)  amLODipine (NORVASC) tablet 5 mg (5 mg Oral Given 09/03/18 0015)  promethazine (PHENERGAN) injection 12.5 mg (12.5 mg Intravenous Given 09/03/18 0016)  acetaminophen (TYLENOL) tablet 1,000 mg (1,000 mg Oral Given 09/03/18 0015)     Headache likely from packing, with fluid level in the sinus will start clindamycin.  Continue antibiotics take tylenol for pain.  BP and Pain markedly improved in the ED.    Final Clinical Impressions(s) / ED Diagnoses   Return for intractable cough, coughing up blood,fevers >100.4 unrelieved by medication, shortness of breath, intractable vomiting, chest pain, shortness of breath, weakness,numbness, changes in speech, facial asymmetry,abdominal pain, passing out,Inability to tolerate liquids or food, cough, altered mental status or any concerns. No signs of systemic illness or infection. The patient is nontoxic-appearing on exam and vital signs are within normal limits.   I have reviewed the triage vital signs and the nursing notes. Pertinent labs &imaging results that were available during my care of the patient were reviewed by me and considered in my medical  decision making (see chart for details).  After history, exam, and medical workup I feel the patient has been appropriately medically screened and is safe for discharge home. Pertinent diagnoses were discussed with the patient. Patient was given return precautions.     Jaheim Canino, MD 09/03/18 [phone]

## 2018-09-03 NOTE — ED Provider Notes (Signed)
[facility] EMERGENCY DEPARTMENT Provider Note   CSN: [id number] Arrival date & time: 09/03/18  1213    History   Chief Complaint Chief Complaint  Patient presents with  . Epistaxis    HPI [name] is a 78 y.o. female.     HPI   78 year old female with history of allergic rhinitis here with recurrent nosebleed.  Patient also has a history of cirrhosis.  She is on a baby aspirin.  She was seen twice over the last 24 hours for similar symptoms.  She was noted to have a right anterior epistaxis, which was treated with a Rhino Rocket.  She had moderate control bleeding at that time and went home.  She is not since then, she is a persistent, severe, headache, related to her sinusitis.  She has CT scan which did show mild sinusitis that was treated with clindamycin.  She been taking it.  She noticed that she has had persistent pain since then, as well as some small amount of bleeding around the left nostril.  She is noted to move the [employer] multiple times during our conversation.  Denies any fevers or chills.  No history of recurrent epistaxis.  No history of easy bruising or bleeding.  No other medical complaints at this time.  No shortness of breath.  No hematemesis.  No abdominal pain.  Past Medical History:  Diagnosis Date  . ALLERGIC RHINITIS   . Anxiety   . Asthma   . Blood type O+   . Cervical strain, acute   . Cirrhosis ([place])   . Colon polyp 2010   adenoma  . Diverticulosis of colon   . Dizziness   . Esophageal varices ([place])   . Fibromyalgia   . GERD (gastroesophageal reflux disease)   . History of nephrolithiasis   . Hypercholesterolemia    borderline  . Hypertension   . IBS (irritable bowel syndrome)   . Osteoarthritis   . Personal history of allergy to unspecified medicinal agent   . Postconcussion syndrome   . Vitamin D deficiency     Patient Active Problem List   Diagnosis Date Noted  . Pancreatitis 07/05/2017  . Chronic  pansinusitis 12/02/2016  . Asthma, allergic, mild intermittent, uncomplicated 30/16/0109  . Environmental and seasonal allergies 12/02/2016  . Obesity (BMI 30.0-34.9) 12/02/2016  . Allergic rhinitis 10/15/2016  . Presbyopia of both eyes 06/27/2016  . Pseudophakia 06/27/2016  . Urine frequency 06/26/2016  . Obese 06/26/2016  . Hyperglycemia 11/13/2015  . Numbness and tingling of both legs below knees 05/24/2014  . Dupuytren's contracture of both hands 01/18/2014  . Dyslipidemia 10/08/2011  . Asthmatic bronchitis 04/25/2011  . Acute sinusitis, unspecified 12/20/2010  . Back pain 07/12/2010  . NEPHROLITHIASIS 04/26/2009  . Vitamin D deficiency 10/30/2008  . COLONIC POLYPS 10/22/2007  . Diverticulosis of colon 10/22/2007  . Postconcussion syndrome 07/10/2007  . Seasonal and perennial allergic rhinitis 06/13/2007  . GERD 06/13/2007  . IBS 06/13/2007  . FIBROMYALGIA 06/13/2007  . PERSONAL HISTORY ALLERGY UNSPEC MEDICINAL AGENT 06/13/2007  . Anxiety state 05/04/2007  . Essential hypertension 05/04/2007  . Osteoarthritis 05/04/2007  . DIZZINESS 04/28/2007    Past Surgical History:  Procedure Laterality Date  . CHOLECYSTECTOMY, LAPAROSCOPIC  2004   for gallstone pancreatitis  . KNEE SURGERY Right   . KNEE SURGERY Left   . THYROID SURGERY       OB History   No obstetric history on file.      Home  Medications    Prior to Admission medications   Medication Sig Start Date End Date Taking? Authorizing Provider  ADVAIR HFA 115-21 MCG/ACT inhaler TAKE 2 PUFFS BY MOUTH TWICE A DAY 08/24/18   Lauree Chandler, NP  aspirin 81 MG EC tablet Take 81 mg by mouth daily.      [provider]  benzonatate (TESSALON PERLES) 100 MG capsule Take 1 capsule (100 mg total) by mouth 3 (three) times daily as needed for cough. 08/13/18   Medina-Vargas, Monina C, NP  Calcium Carbonate-Vitamin D (CALCIUM 600+D) 600-400 MG-UNIT tablet Take 1 tablet by mouth 2 (two) times daily.    [provider]  Cetirizine HCl (ZYRTEC ALLERGY) 10 MG CAPS Take 10 mg by mouth daily.     [provider]  Cholecalciferol (VITAMIN D3) 1000 UNITS CAPS Take 1,000 Units by mouth daily.     [provider]  clindamycin (CLEOCIN) 300 MG capsule Take 1 capsule (300 mg total) by mouth 4 (four) times daily. X 7 days 09/03/18   Randal Buba, April, MD  esomeprazole (NEXIUM) 40 MG capsule Take 1 capsule (40 mg total) by mouth 2 (two) times daily before a meal. 08/20/18   Armbruster, Carlota Raspberry, MD  fluticasone (FLONASE) 50 MCG/ACT nasal spray SHAKE LIQUID AND USE ONE TO TWO SPRAYS IN EACH NOSTRIL TWICE DAILY 08/03/18   Lauree Chandler, NP  hydrochlorothiazide (MICROZIDE) 12.5 MG capsule TAKE 1 CAPSULE BY MOUTH EVERY DAY 05/18/18   Lauree Chandler, NP  KLOR-CON M20 20 MEQ tablet TAKE 1 TABLET BY MOUTH EVERY DAY 07/29/18   Lauree Chandler, NP  meclizine (ANTIVERT) 25 MG tablet TAKE 1 TABLET 3 TIMES A DAY AS NEEDED FOR DIZZINESS/NAUSEA 07/29/18   Lauree Chandler, NP  Multiple Vitamins-Minerals (CENTRUM SILVER PO) Take 1 tablet by mouth daily.     [provider]  Omega-3 Fatty Acids (FISH OIL MAXIMUM STRENGTH) 1200 MG CAPS Take 1,200 mg by mouth 2 (two) times daily.     [provider]  PROAIR HFA 108 (90 Base) MCG/ACT inhaler INHALE 2 PUFFS EVERY 4 HOURS AS NEEDED INTO THE LUNGS FOR WHEEZING OR SHORTNESS OF BREATH 07/31/18   Lauree Chandler, NP  propranolol (INDERAL) 10 MG tablet Take 1 tablet (10 mg total) by mouth 2 (two) times daily. Patient not taking: Reported on 08/13/2018 07/21/18   Yetta Flock, MD  Propylene Glycol (SYSTANE BALANCE) 0.6 % SOLN Place 1 drop into both ears 2 (two) times daily. 11/27/17   Reed, Tiffany L, DO  SALINE NASAL SPRAY NA Place 1 spray into both nostrils 2 (two) times daily.     [provider]    Family History Family History  Problem Relation Age of Onset  . Breast cancer Mother   . Alzheimer's disease Mother   . Heart  disease Mother   . COPD Brother   . Heart disease Sister   . Breast cancer Sister 34  . Alcohol abuse Sister   . Ovarian cancer Paternal Grandmother   . Arthritis Other        Cousins   . COPD Cousin        Maternal side   . Heart attack Maternal Uncle     Social History Social History   Tobacco Use  . Smoking status: Former Smoker    Packs/day: 2.00    Years: 8.00    Pack years: 16.00    Types: Cigarettes    Last attempt to quit: 05/13/1962  Years since quitting: 56.3  . Smokeless tobacco: Never Used  Substance Use Topics  . Alcohol use: No  . Drug use: No     Allergies   Ace inhibitors; Amoxicillin; Benicar hct [olmesartan medoxomil-hctz]; Budesonide-formoterol fumarate; Chlorzoxazone [chlorzoxazone]; Citalopram hydrobromide; Citalopram hydrobromide; Clonidine derivatives; Droperidol; Flexeril [cyclobenzaprine hcl]; Fluconazole; Ketorolac tromethamine; Ketorolac tromethamine; Metoclopramide hcl; Mometasone furo-formoterol fum; Morphine; Olmesartan medoxomil; and Breo ellipta [fluticasone furoate-vilanterol]   Review of Systems Review of Systems  Constitutional: Negative for chills, fatigue and fever.  HENT: Positive for nosebleeds. Negative for congestion and rhinorrhea.   Eyes: Negative for visual disturbance.  Respiratory: Negative for cough, shortness of breath and wheezing.   Cardiovascular: Negative for chest pain and leg swelling.  Gastrointestinal: Negative for abdominal pain, diarrhea, nausea and vomiting.  Genitourinary: Negative for dysuria and flank pain.  Musculoskeletal: Negative for neck pain and neck stiffness.  Skin: Negative for rash and wound.  Allergic/Immunologic: Negative for immunocompromised state.  Neurological: Positive for headaches. Negative for syncope and weakness.  All other systems reviewed and are negative.    Physical Exam Updated Vital Signs BP (!) 165/69   Pulse 80   Temp 98.2 F (36.8 C)   Resp 16   SpO2 94%   Physical  Exam Vitals signs and nursing note reviewed.  Constitutional:      General: She is not in acute distress.    Appearance: She is well-developed.  HENT:     Head: Normocephalic and atraumatic.     Comments: Markedly dry nasal passages bilaterally with pale mucosa.  Following anesthesia with topical Cetacaine, nasal packing removed with patient consent.  There is a small area of clot adhered to the left Keates will back's plexus.  No evidence of posterior nosebleed.  Right nares with small inflammation along Kiesselbach's as well, but without recent stigmata of bleeding. Eyes:     Conjunctiva/sclera: Conjunctivae normal.  Neck:     Musculoskeletal: Neck supple.  Cardiovascular:     Rate and Rhythm: Normal rate and regular rhythm.     Heart sounds: Normal heart sounds. No murmur. No friction rub.  Pulmonary:     Effort: Pulmonary effort is normal. No respiratory distress.     Breath sounds: Normal breath sounds. No wheezing or rales.  Abdominal:     General: There is no distension.     Palpations: Abdomen is soft.     Tenderness: There is no abdominal tenderness.  Skin:    General: Skin is warm.     Capillary Refill: Capillary refill takes less than 2 seconds.  Neurological:     Mental Status: She is alert and oriented to person, place, and time.     Motor: No abnormal muscle tone.      ED Treatments / Results  Labs (all labs ordered are listed, but only abnormal results are displayed) Labs Reviewed - No data to display  EKG None  Radiology Ct Head Wo Contrast  Result Date: 09/02/2018 CLINICAL DATA:  Epistaxis EXAM: CT HEAD WITHOUT CONTRAST TECHNIQUE: Contiguous axial images were obtained from the base of the skull through the vertex without intravenous contrast. COMPARISON:  Earlier today FINDINGS: Brain: There is atrophy and chronic small vessel disease changes. No acute intracranial abnormality. Specifically, no hemorrhage, hydrocephalus, mass lesion, acute infarction, or  significant intracranial injury. Vascular: No hyperdense vessel or unexpected calcification. Skull: No acute calvarial abnormality. Sinuses/Orbits: Air-fluid level in the left maxillary sinus. Mastoid air cells clear. Other: None IMPRESSION: Atrophy, chronic microvascular disease. No acute  intracranial abnormality. Air-fluid level in the left maxillary sinus Electronically Signed   By: Rolm Baptise M.D.   On: 09/02/2018 23:40   Ct Head Wo Contrast  Result Date: 09/02/2018 CLINICAL DATA:  Headache EXAM: CT HEAD WITHOUT CONTRAST TECHNIQUE: Contiguous axial images were obtained from the base of the skull through the vertex without intravenous contrast. COMPARISON:  04/29/2007 FINDINGS: Brain: There is no mass, hemorrhage or extra-axial collection. The size and configuration of the ventricles and extra-axial CSF spaces are normal. There is hypoattenuation of the white matter, most commonly indicating chronic small vessel disease. Vascular: No abnormal hyperdensity of the major intracranial arteries or dural venous sinuses. No intracranial atherosclerosis. Skull: The visualized skull base, calvarium and extracranial soft tissues are normal. Sinuses/Orbits: Partial opacification of the left maxillary sinus with fluid level. The orbits are normal. IMPRESSION: 1. Chronic ischemic microangiopathy without acute intracranial abnormality. 2. Fluid level in left maxillary sinus. Correlate for symptoms of acute sinusitis. Electronically Signed   By: Ulyses Jarred M.D.   On: 09/02/2018 03:07    Procedures .Epistaxis Management Date/Time: 09/03/2018 1:34 PM Performed by: Duffy Bruce, MD Authorized by: Duffy Bruce, MD   Consent:    Consent obtained:  Verbal   Consent given by:  Patient   Risks discussed:  Bleeding, infection, nasal injury and pain   Alternatives discussed:  Observation and alternative treatment Anesthesia (see MAR for exact dosages):    Anesthesia method:  Topical application   Topical  anesthesia: Cetacaine. Procedure details:    Treatment site:  L anterior   Treatment method:  Silver nitrate   Treatment complexity:  Limited   Treatment episode: recurring   Post-procedure details:    Assessment:  Bleeding stopped   Patient tolerance of procedure:  Tolerated well, no immediate complications Comments:     The [employer] was removed atraumatically.  Sterile jelly was used to facilitate this.  The evidence of bleeding was all noted along the anteriormost portion of the balloon, with no evidence of posterior blood.   (including critical care time)  Medications Ordered in ED Medications  lidocaine-EPINEPHrine (XYLOCAINE W/EPI) 2 %-1:200000 (PF) injection 10 mL (10 mLs Other Not Given 09/03/18 1317)  lidocaine (XYLOCAINE) 2 % jelly 1 application (1 application Topical Not Given 09/03/18 1317)  oxymetazoline (AFRIN) 0.05 % nasal spray 1 spray (1 spray Each Nare Provided for home use 09/03/18 1324)     Initial Impression / Assessment and Plan / ED Course  I have reviewed the triage vital signs and the nursing notes.  Pertinent labs & imaging results that were available during my care of the patient were reviewed by me and considered in my medical decision making (see chart for details).    78 year old female here with desire for removal of nasal packing.  After discussion of the risks of recurrent bleed and need for replacement, patient adamant for packing to be removed.  This was removed atraumatically.  Patient does have stigmata of recent anterior bleed along the left anterior nares.  There is no apparent active bleeding now, given her history, silver nitrate was used to cauterize the area.  She had good hemostasis with this.  She was monitored and had no recurrence of bleeding.  She does have markedly dry nasal mucosa bilaterally, which I suspect is due to allergic rhinitis and microtrauma secondary to rubbing her nose.  She was advised to stop this.  Will also give her  oxymetazoline as needed for home, and have her follow-up with  ENT.     Final Clinical Impressions(s) / ED Diagnoses   Final diagnoses:  Epistaxis    ED Discharge Orders    None       Duffy Bruce, MD 09/03/18 1336

## 2018-09-03 NOTE — ED Notes (Signed)
Pt discharged from ED; instructions provided and scripts given; Pt encouraged to return to ED if symptoms worsen and to f/u with PCP; Pt verbalized understanding of all instructions 

## 2018-09-03 NOTE — Discharge Instructions (Signed)
If your nose bleeds again:  Step 1: Blow your nose gently to remove any blood clots  Step 2: Apply FIRM, DIRECT PRESSURE to your NASAL BRIDGE (this is the flat part of your nose above your nostrils) for 5 minutes straight. Lean your head forward to help prevent blood from going down your throat.  Step 3: Remove pressure and see if your nose is still bleeding.  If it is, you can gently blow your nose again to remove any clots.  Step 4: Use the Afrin/oxymetazoline nasal spray that I have given you.  Spray 2 sprays into both nostrils.  Then, apply FIRM, DIRECT PRESSURE to your nasal bridge again for 5 minutes.  Step 5: If bleeding is not controlled, continue holding pressure and seek medical attention   Use a small amount of vaseline in your nostrils to help moisturize. You can also use over-the-counter SALINE SPRAY to help prevent your nose bleeds.

## 2018-09-04 ENCOUNTER — Other Ambulatory Visit: Payer: Self-pay

## 2018-09-04 ENCOUNTER — Encounter: Payer: Self-pay | Admitting: Adult Health

## 2018-09-04 ENCOUNTER — Ambulatory Visit: Payer: Self-pay | Admitting: Family

## 2018-09-04 ENCOUNTER — Encounter (HOSPITAL_COMMUNITY): Payer: Self-pay | Admitting: Emergency Medicine

## 2018-09-04 ENCOUNTER — Ambulatory Visit (INDEPENDENT_AMBULATORY_CARE_PROVIDER_SITE_OTHER): Payer: PPO | Admitting: Adult Health

## 2018-09-04 ENCOUNTER — Emergency Department (HOSPITAL_COMMUNITY)
Admission: EM | Admit: 2018-09-04 | Discharge: 2018-09-04 | Disposition: A | Discharge status: Home / Self Care | Payer: PPO | Attending: Emergency Medicine | Admitting: Emergency Medicine

## 2018-09-04 ENCOUNTER — Ambulatory Visit: Payer: Self-pay | Admitting: Nurse Practitioner

## 2018-09-04 DIAGNOSIS — Z79899 Other long term (current) drug therapy: Secondary | ICD-10-CM | POA: Diagnosis not present

## 2018-09-04 DIAGNOSIS — J3089 Other allergic rhinitis: Secondary | ICD-10-CM | POA: Diagnosis not present

## 2018-09-04 DIAGNOSIS — J302 Other seasonal allergic rhinitis: Secondary | ICD-10-CM | POA: Diagnosis not present

## 2018-09-04 DIAGNOSIS — I10 Essential (primary) hypertension: Secondary | ICD-10-CM | POA: Diagnosis not present

## 2018-09-04 DIAGNOSIS — R040 Epistaxis: Secondary | ICD-10-CM

## 2018-09-04 DIAGNOSIS — R51 Headache: Secondary | ICD-10-CM | POA: Insufficient documentation

## 2018-09-04 DIAGNOSIS — Z87891 Personal history of nicotine dependence: Secondary | ICD-10-CM | POA: Diagnosis not present

## 2018-09-04 DIAGNOSIS — R58 Hemorrhage, not elsewhere classified: Secondary | ICD-10-CM | POA: Diagnosis not present

## 2018-09-04 MED ORDER — FLUTICASONE PROPIONATE 50 MCG/ACT NA SUSP: 2.0000 | Freq: Every day | NASAL | 6 refills | Status: DC | PRN

## 2018-09-04 MED ORDER — OXYMETAZOLINE HCL 0.05 % NA SOLN
1.0000 | Freq: Once | NASAL | Status: AC
  Administered 2018-09-04: 1 via NASAL
  Filled 2018-09-04: qty 30

## 2018-09-04 NOTE — Discharge Instructions (Signed)
Go to your appointment at 210 today.  If bleeding returns you can either come to the ER or call the ENT office to see if he can get in sooner.

## 2018-09-04 NOTE — ED Provider Notes (Signed)
[facility] EMERGENCY DEPARTMENT Provider Note   CSN: [id number] Arrival date & time: 09/04/18  0754    History   Chief Complaint Chief Complaint  Patient presents with  . Epistaxis    HPI [name] is a 78 y.o. female.     HPI Patient presents with recurrent epistaxis.  Came from home with EMS.  Had a nosebleed this morning.  This is her fourth visit for nosebleed related symptoms the last 3 days.  Also she is worried because her blood pressure was 220/110 this morning.  And had a recurrent bleed and been giving herself Afrin.  Bleeding of the left nostril.  It had previous packing been removed and had cautery with silver nitrate.  States has had more bleeding on the left side again.  No lightheadedness or dizziness.  No current headache.  No fevers.  Has an appointment with Dr. Benjamine Mola at 210 today. Past Medical History:  Diagnosis Date  . ALLERGIC RHINITIS   . Anxiety   . Asthma   . Blood type O+   . Cervical strain, acute   . Cirrhosis ([place])   . Colon polyp 2010   adenoma  . Diverticulosis of colon   . Dizziness   . Esophageal varices ([place])   . Fibromyalgia   . GERD (gastroesophageal reflux disease)   . History of nephrolithiasis   . Hypercholesterolemia    borderline  . Hypertension   . IBS (irritable bowel syndrome)   . Osteoarthritis   . Personal history of allergy to unspecified medicinal agent   . Postconcussion syndrome   . Vitamin D deficiency     Patient Active Problem List   Diagnosis Date Noted  . Pancreatitis 07/05/2017  . Chronic pansinusitis 12/02/2016  . Asthma, allergic, mild intermittent, uncomplicated 70/35/0093  . Environmental and seasonal allergies 12/02/2016  . Obesity (BMI 30.0-34.9) 12/02/2016  . Allergic rhinitis 10/15/2016  . Presbyopia of both eyes 06/27/2016  . Pseudophakia 06/27/2016  . Urine frequency 06/26/2016  . Obese 06/26/2016  . Hyperglycemia 11/13/2015  . Numbness and tingling of both legs below  knees 05/24/2014  . Dupuytren's contracture of both hands 01/18/2014  . Dyslipidemia 10/08/2011  . Asthmatic bronchitis 04/25/2011  . Acute sinusitis, unspecified 12/20/2010  . Back pain 07/12/2010  . NEPHROLITHIASIS 04/26/2009  . Vitamin D deficiency 10/30/2008  . COLONIC POLYPS 10/22/2007  . Diverticulosis of colon 10/22/2007  . Postconcussion syndrome 07/10/2007  . Seasonal and perennial allergic rhinitis 06/13/2007  . GERD 06/13/2007  . IBS 06/13/2007  . FIBROMYALGIA 06/13/2007  . PERSONAL HISTORY ALLERGY UNSPEC MEDICINAL AGENT 06/13/2007  . Anxiety state 05/04/2007  . Essential hypertension 05/04/2007  . Osteoarthritis 05/04/2007  . DIZZINESS 04/28/2007    Past Surgical History:  Procedure Laterality Date  . CHOLECYSTECTOMY, LAPAROSCOPIC  2004   for gallstone pancreatitis  . KNEE SURGERY Right   . KNEE SURGERY Left   . THYROID SURGERY       OB History   No obstetric history on file.      Home Medications    Prior to Admission medications   Medication Sig Start Date End Date Taking? Authorizing Provider  ADVAIR HFA 115-21 MCG/ACT inhaler TAKE 2 PUFFS BY MOUTH TWICE A DAY 08/24/18   Lauree Chandler, NP  aspirin 81 MG EC tablet Take 81 mg by mouth daily.      [provider]  benzonatate (TESSALON PERLES) 100 MG capsule Take 1 capsule (100 mg total) by mouth 3 (three)  times daily as needed for cough. 08/13/18   Medina-Vargas, Monina C, NP  Calcium Carbonate-Vitamin D (CALCIUM 600+D) 600-400 MG-UNIT tablet Take 1 tablet by mouth 2 (two) times daily.    [provider]  Cetirizine HCl (ZYRTEC ALLERGY) 10 MG CAPS Take 10 mg by mouth daily.     [provider]  Cholecalciferol (VITAMIN D3) 1000 UNITS CAPS Take 1,000 Units by mouth daily.     [provider]  clindamycin (CLEOCIN) 300 MG capsule Take 1 capsule (300 mg total) by mouth 4 (four) times daily. X 7 days 09/03/18   Randal Buba, April, MD  esomeprazole (NEXIUM) 40 MG capsule Take 1  capsule (40 mg total) by mouth 2 (two) times daily before a meal. 08/20/18   Armbruster, Carlota Raspberry, MD  fluticasone (FLONASE) 50 MCG/ACT nasal spray SHAKE LIQUID AND USE ONE TO TWO SPRAYS IN EACH NOSTRIL TWICE DAILY 08/03/18   Lauree Chandler, NP  hydrochlorothiazide (MICROZIDE) 12.5 MG capsule TAKE 1 CAPSULE BY MOUTH EVERY DAY 05/18/18   Lauree Chandler, NP  KLOR-CON M20 20 MEQ tablet TAKE 1 TABLET BY MOUTH EVERY DAY 07/29/18   Lauree Chandler, NP  meclizine (ANTIVERT) 25 MG tablet TAKE 1 TABLET 3 TIMES A DAY AS NEEDED FOR DIZZINESS/NAUSEA 07/29/18   Lauree Chandler, NP  Multiple Vitamins-Minerals (CENTRUM SILVER PO) Take 1 tablet by mouth daily.     [provider]  Omega-3 Fatty Acids (FISH OIL MAXIMUM STRENGTH) 1200 MG CAPS Take 1,200 mg by mouth 2 (two) times daily.     [provider]  PROAIR HFA 108 (90 Base) MCG/ACT inhaler INHALE 2 PUFFS EVERY 4 HOURS AS NEEDED INTO THE LUNGS FOR WHEEZING OR SHORTNESS OF BREATH 07/31/18   Lauree Chandler, NP  propranolol (INDERAL) 10 MG tablet Take 1 tablet (10 mg total) by mouth 2 (two) times daily. Patient not taking: Reported on 08/13/2018 07/21/18   Yetta Flock, MD  Propylene Glycol (SYSTANE BALANCE) 0.6 % SOLN Place 1 drop into both ears 2 (two) times daily. 11/27/17   Reed, Tiffany L, DO  SALINE NASAL SPRAY NA Place 1 spray into both nostrils 2 (two) times daily.     [provider]    Family History Family History  Problem Relation Age of Onset  . Breast cancer Mother   . Alzheimer's disease Mother   . Heart disease Mother   . COPD Brother   . Heart disease Sister   . Breast cancer Sister 7  . Alcohol abuse Sister   . Ovarian cancer Paternal Grandmother   . Arthritis Other        Cousins   . COPD Cousin        Maternal side   . Heart attack Maternal Uncle     Social History Social History   Tobacco Use  . Smoking status: Former Smoker    Packs/day: 2.00    Years: 8.00    Pack years: 16.00     Types: Cigarettes    Last attempt to quit: 05/13/1962    Years since quitting: 56.3  . Smokeless tobacco: Never Used  Substance Use Topics  . Alcohol use: No  . Drug use: No     Allergies   Ace inhibitors; Amoxicillin; Benicar hct [olmesartan medoxomil-hctz]; Budesonide-formoterol fumarate; Chlorzoxazone [chlorzoxazone]; Citalopram hydrobromide; Citalopram hydrobromide; Clonidine derivatives; Droperidol; Flexeril [cyclobenzaprine hcl]; Fluconazole; Ketorolac tromethamine; Ketorolac tromethamine; Metoclopramide hcl; Mometasone furo-formoterol fum; Morphine; Olmesartan medoxomil; and Breo ellipta [fluticasone furoate-vilanterol]   Review of  Systems Review of Systems  Constitutional: Negative for appetite change.  HENT: Positive for nosebleeds.   Respiratory: Negative for cough and shortness of breath.   Neurological: Positive for headaches.  Hematological: Does not bruise/bleed easily.     Physical Exam Updated Vital Signs BP (!) 201/77 (BP Location: Right Arm)   Pulse 86   Temp 98.9 F (37.2 C) (Oral)   Resp 16   Ht 5' 1"  (1.549 m)   Wt 86.6 kg   SpO2 97%   BMI 36.09 kg/m   Physical Exam Vitals signs and nursing note reviewed.  HENT:     Nose:     Comments: Area of bleeding anteriorly on left anterior area.    Mouth/Throat:     Comments: No posterior blood. Cardiovascular:     Rate and Rhythm: Normal rate.  Skin:    General: Skin is warm.     Capillary Refill: Capillary refill takes less than 2 seconds.  Neurological:     Mental Status: She is alert. Mental status is at baseline.      ED Treatments / Results  Labs (all labs ordered are listed, but only abnormal results are displayed) Labs Reviewed - No data to display  EKG None  Radiology Ct Head Wo Contrast  Result Date: 09/02/2018 CLINICAL DATA:  Epistaxis EXAM: CT HEAD WITHOUT CONTRAST TECHNIQUE: Contiguous axial images were obtained from the base of the skull through the vertex without intravenous  contrast. COMPARISON:  Earlier today FINDINGS: Brain: There is atrophy and chronic small vessel disease changes. No acute intracranial abnormality. Specifically, no hemorrhage, hydrocephalus, mass lesion, acute infarction, or significant intracranial injury. Vascular: No hyperdense vessel or unexpected calcification. Skull: No acute calvarial abnormality. Sinuses/Orbits: Air-fluid level in the left maxillary sinus. Mastoid air cells clear. Other: None IMPRESSION: Atrophy, chronic microvascular disease. No acute intracranial abnormality. Air-fluid level in the left maxillary sinus Electronically Signed   By: Rolm Baptise M.D.   On: 09/02/2018 23:40    Procedures .Epistaxis Management Date/Time: 09/04/2018 9:09 AM Performed by: Davonna Belling, MD Authorized by: Davonna Belling, MD   Consent:    Consent obtained:  Verbal   Consent given by:  Patient   Risks discussed:  Bleeding, infection and nasal injury   Alternatives discussed:  No treatment, delayed treatment and alternative treatment Anesthesia (see MAR for exact dosages):    Anesthesia method:  None Procedure details:    Treatment site:  L anterior   Treatment method:  Nasal tampon (rhino rocket medium size)   Treatment complexity:  Limited   Treatment episode: recurring   Post-procedure details:    Assessment:  Bleeding stopped   Patient tolerance of procedure:  Tolerated well, no immediate complications   (including critical care time)  Medications Ordered in ED Medications  oxymetazoline (AFRIN) 0.05 % nasal spray 1 spray (1 spray Each Nare Given 09/04/18 0831)     Initial Impression / Assessment and Plan / ED Course  I have reviewed the triage vital signs and the nursing notes.  Pertinent labs & imaging results that were available during my care of the patient were reviewed by me and considered in my medical decision making (see chart for details).        Patient with epistaxis.  Recurrent.  Placed Rhino Rocket  with some Afrin patient appears to have had no further bleeding.  Has a follow-up with Dr. Benjamine Mola at 210 today.  Did have an elevated blood pressure when she got here.  Could be reactive  to the bleeding with some nervousness but also could be secondary simply Afrin she been given previously.  Either way it is come down while in the department.  Discharge home.  Final Clinical Impressions(s) / ED Diagnoses   Final diagnoses:  None    ED Discharge Orders    None       Davonna Belling, MD 09/04/18 [phone]

## 2018-09-04 NOTE — ED Triage Notes (Signed)
Pt BIB [facility] from home. Pt complaining of nosebleed that began upon waking this AM. Pt has been seen here x3 in the past 30 hours for the same complaint. Pt hypertensive with EMS 220/110.

## 2018-09-04 NOTE — Patient Instructions (Addendum)
1. Epistaxis - continue Afrin nasal spray 2-3 sprays into each nostril twice a day as needed for <=3 days, do not use for more than 3 consecutive days at a time, stop picking nose to prevent irritation/trauma - use a humidifier to prevent nasal dryness, hold ASA, continue Saline nasal spray BID   2. Seasonal and perennial allergic rhinitis  - needs to change her Flonase to as needed  3. Essential hypertension - continue HCTZ and propranolol -  check her BP three times a day and  log in a notebook, if SBP>160, she needs to call the office to follow up   - Call back and ask for an in-person evaluation if the symptoms worsen or if the condition fails to improve

## 2018-09-04 NOTE — Progress Notes (Signed)
This service is provided via telemedicine  No vital signs collected/recorded due to the encounter was a telemedicine visit.   Location of patient (ex: home, work): home  Patient consents to a telephone visit:  yes  Location of the provider (ex: office, home):  office  Name of any referring provider:  Sherrie Mustache, NP  Names of all persons participating in the telemedicine service and their role in the encounter:  Ruthell Rummage CMA, Durenda Age NP  Time spent on call:  Ruthell Rummage CMA spent 9 Minutes with patient on phone      DATE:  09/04/2018 MRN:  [id number]  BIRTHDAY: 1940-10-24   Contact Information    Name Relation Home Work [place] Relative   Glen Campbell Son [phone]        Code Status History    Date Active Date Inactive Code Status Order ID Comments User Context   08/14/2017 0901 03/28/2018 0010 DNR [id number]  Lauree Chandler, NP Outpatient   07/05/2017 1407 07/06/2017 1703 Full Code [id number]  Lady Deutscher, MD ED    Questions for Most Recent Historical Code Status (Order [id number])    Question Answer Comment   In the event of cardiac or respiratory ARREST Do not call a code blue    In the event of cardiac or respiratory ARREST Do not perform Intubation, CPR, defibrillation or ACLS    In the event of cardiac or respiratory ARREST Use medication by any route, position, wound care, and other measures to relive pain and suffering. May use oxygen, suction and manual treatment of airway obstruction as needed for comfort.        Chief Complaint  Patient presents with   Acute Visit    Nose bleeds, concerned she is having elevated blood pressure, recently went to ER for nose bleeds     HISTORY OF PRESENT ILLNESS: This is a 78 year old female who is calling in for a televisit regarding her epistaxis. She said that she has been in the ED this week for 4X for her left nasal bleeding. She  had nasal cauterization with silver nitrate yesterday and had rhino rocket in her nose. She was given Afrin nasal spray and has appointment with ENT, Dr. Benjamine Mola, at 2:10 PM today. She was concerned about her BP in the ED which was 210/110 which eventually went down to SBP 150 before she was discharged from ED today. She said that she does not check her BP at home and does not know if her BP monitor at home is working. She said that she has allergic rhinitis and admits to picking her nose. Instructed her not to pick her nose and changed Flonase to as needed.    PAST MEDICAL HISTORY:  Past Medical History:  Diagnosis Date   ALLERGIC RHINITIS    Anxiety    Asthma    Blood type O+    Cervical strain, acute    Cirrhosis ([place])    Colon polyp 2010   adenoma   Diverticulosis of colon    Dizziness    Esophageal varices ([facility])    Fibromyalgia    GERD (gastroesophageal reflux disease)    History of nephrolithiasis    Hypercholesterolemia    borderline   Hypertension    IBS (irritable bowel syndrome)    Osteoarthritis    Personal history of allergy to unspecified medicinal agent    Postconcussion syndrome    Vitamin D deficiency  CURRENT MEDICATIONS: Reviewed  Patient's Medications  New Prescriptions   FLUTICASONE (FLONASE) 50 MCG/ACT NASAL SPRAY    Place 2 sprays into both nostrils daily as needed for allergies or rhinitis.  Previous Medications   ADVAIR HFA 115-21 MCG/ACT INHALER    TAKE 2 PUFFS BY MOUTH TWICE A DAY   BENZONATATE (TESSALON PERLES) 100 MG CAPSULE    Take 1 capsule (100 mg total) by mouth 3 (three) times daily as needed for cough.   CALCIUM CARBONATE-VITAMIN D (CALCIUM 600+D) 600-400 MG-UNIT TABLET    Take 1 tablet by mouth 2 (two) times daily.   CETIRIZINE HCL (ZYRTEC ALLERGY) 10 MG CAPS    Take 10 mg by mouth daily.    CHOLECALCIFEROL (VITAMIN D3) 1000 UNITS CAPS    Take 1,000 Units by mouth daily.    CLINDAMYCIN (CLEOCIN) 300 MG CAPSULE    Take 1  capsule (300 mg total) by mouth 4 (four) times daily. X 7 days   ESOMEPRAZOLE (NEXIUM) 40 MG CAPSULE    Take 1 capsule (40 mg total) by mouth 2 (two) times daily before a meal.   HYDROCHLOROTHIAZIDE (MICROZIDE) 12.5 MG CAPSULE    TAKE 1 CAPSULE BY MOUTH EVERY DAY   KLOR-CON M20 20 MEQ TABLET    TAKE 1 TABLET BY MOUTH EVERY DAY   MECLIZINE (ANTIVERT) 25 MG TABLET    TAKE 1 TABLET 3 TIMES A DAY AS NEEDED FOR DIZZINESS/NAUSEA   MULTIPLE VITAMINS-MINERALS (CENTRUM SILVER PO)    Take 1 tablet by mouth daily.    OMEGA-3 FATTY ACIDS (FISH OIL MAXIMUM STRENGTH) 1200 MG CAPS    Take 1,200 mg by mouth 2 (two) times daily.    PROAIR HFA 108 (90 BASE) MCG/ACT INHALER    INHALE 2 PUFFS EVERY 4 HOURS AS NEEDED INTO THE LUNGS FOR WHEEZING OR SHORTNESS OF BREATH   PROPRANOLOL (INDERAL) 10 MG TABLET    Take 1 tablet (10 mg total) by mouth 2 (two) times daily.   PROPYLENE GLYCOL (SYSTANE BALANCE) 0.6 % SOLN    Place 1 drop into both ears 2 (two) times daily.   SALINE NASAL SPRAY NA    Place 1 spray into both nostrils 2 (two) times daily.   Modified Medications   No medications on file  Discontinued Medications   ASPIRIN 81 MG EC TABLET    Take 81 mg by mouth daily.     FLUTICASONE (FLONASE) 50 MCG/ACT NASAL SPRAY    SHAKE LIQUID AND USE ONE TO TWO SPRAYS IN EACH NOSTRIL TWICE DAILY     Allergies  Allergen Reactions   Ace Inhibitors    Amoxicillin Itching    REACTION: unknown? pain in right kidney   Benicar Hct [Olmesartan Medoxomil-Hctz]     Extreme weakness   Budesonide-Formoterol Fumarate     Causes tremors and numbness   Chlorzoxazone [Chlorzoxazone]    Citalopram Hydrobromide     REACTION: hives   Citalopram Hydrobromide    Clonidine Derivatives    Droperidol     REACTION: hives   Flexeril [Cyclobenzaprine Hcl]    Fluconazole Nausea And Vomiting and Other (See Comments)    fatigue   Ketorolac Tromethamine     REACTION: hives   Ketorolac Tromethamine    Metoclopramide Hcl     Mometasone Furo-Formoterol Fum     Causes sore throat, blurred vision and unable to sleep--started on med 09-17-10   Morphine     REACTION: hives and itching   Olmesartan Medoxomil  REACTION: fatique   Breo Ellipta [Fluticasone Furoate-Vilanterol] Itching    Pt reports itching with Memory Dance is related to being lactose intolerant.      REVIEW OF SYSTEMS:  GENERAL: no change in appetite, no fatigue, no weight changes, no fever, chills or weakness SKIN: Denies rash, itching, wounds, ulcer sores, or nail abnormality NOSE: + epistaxis MOUTH and THROAT: Denies oral discomfort, gingival pain or bleeding RESPIRATORY: no cough, SOB, DOE, wheezing, hemoptysis CARDIAC: no chest pain, edema or palpitations GI: no abdominal pain, diarrhea, constipation, heart burn, nausea or vomiting GU: Denies dysuria, frequency, hematuria, incontinence, or discharge NEUROLOGICAL: Denies dizziness, syncope, numbness, or headache PSYCHIATRIC: Denies feeling of depression or anxiety. No report of hallucinations, insomnia, paranoia, or agitation   LABS/RADIOLOGY: Labs reviewed: Basic Metabolic Panel: Recent Labs    06/18/18 0947 06/25/18 0943 09/02/18 0218  NA 141 143 137  K 4.4 4.4 3.9  CL 103 103 101  CO2 30 32 23  GLUCOSE 107* 110* 143*  BUN 20 20 17   CREATININE 0.79 0.75 0.80  CALCIUM 9.9 9.9 9.6   Liver Function Tests: Recent Labs    12/18/17 1525  06/18/18 0947 06/25/18 0943 09/02/18 0218  AST 29   < > 76* 69* 56*  ALT 33   < > 96* 91* 66*  ALKPHOS 102  --   --  89 123  BILITOT 0.5   < > 0.6 0.8 0.8  PROT 6.7   < > 6.8 6.8 6.3*  ALBUMIN 4.0  --   --  4.4 3.6   < > = values in this interval not displayed.   CBC: Recent Labs    03/30/18 1106 06/18/18 0947 09/02/18 0218  WBC 8.1 8.7 9.2  NEUTROABS 3,540 4,411 3.7  HGB 15.1 15.3 14.7  HCT 44.4 45.5* 44.1  MCV 83.8 86.5 87.5  PLT 293 311 268   A1C: Invalid input(s): A1C Lipid Panel: Recent Labs    03/30/18 1106  06/18/18 0947  HDL 40* 46*     Ct Head Wo Contrast  Result Date: 09/02/2018 CLINICAL DATA:  Epistaxis EXAM: CT HEAD WITHOUT CONTRAST TECHNIQUE: Contiguous axial images were obtained from the base of the skull through the vertex without intravenous contrast. COMPARISON:  Earlier today FINDINGS: Brain: There is atrophy and chronic small vessel disease changes. No acute intracranial abnormality. Specifically, no hemorrhage, hydrocephalus, mass lesion, acute infarction, or significant intracranial injury. Vascular: No hyperdense vessel or unexpected calcification. Skull: No acute calvarial abnormality. Sinuses/Orbits: Air-fluid level in the left maxillary sinus. Mastoid air cells clear. Other: None IMPRESSION: Atrophy, chronic microvascular disease. No acute intracranial abnormality. Air-fluid level in the left maxillary sinus Electronically Signed   By: Rolm Baptise M.D.   On: 09/02/2018 23:40   Ct Head Wo Contrast  Result Date: 09/02/2018 CLINICAL DATA:  Headache EXAM: CT HEAD WITHOUT CONTRAST TECHNIQUE: Contiguous axial images were obtained from the base of the skull through the vertex without intravenous contrast. COMPARISON:  04/29/2007 FINDINGS: Brain: There is no mass, hemorrhage or extra-axial collection. The size and configuration of the ventricles and extra-axial CSF spaces are normal. There is hypoattenuation of the white matter, most commonly indicating chronic small vessel disease. Vascular: No abnormal hyperdensity of the major intracranial arteries or dural venous sinuses. No intracranial atherosclerosis. Skull: The visualized skull base, calvarium and extracranial soft tissues are normal. Sinuses/Orbits: Partial opacification of the left maxillary sinus with fluid level. The orbits are normal. IMPRESSION: 1. Chronic ischemic microangiopathy without acute intracranial abnormality. 2. Fluid level  in left maxillary sinus. Correlate for symptoms of acute sinusitis. Electronically Signed   By:  Ulyses Jarred M.D.   On: 09/02/2018 03:07    ASSESSMENT/PLAN:   1. Epistaxis - instructed her to continue Afrin nasal spray 2-3 sprays into each nostril BID PRN for <=3 days, stop picking her nose to prevent irritation/trauma - use a humidifier to prevent nasal dryness, hold ASA, continue Saline nasal spray BID and  2. Seasonal and perennial allergic rhinitis  - needs to change her Flonase to PRN  3. Essential hypertension - she does not check her BP at home,denies headache, elevated BP might be due to situation - continue HCTZ and propranolol - instructed to check her BP TID and log in a notebook, if SBP>160, she needs to call the office to follow up     Time spent on non face to face visit:  11 minutes  The patient gave consent to this telephone visit. Explained to the patient the risk and privacy issue that was involved with this telephone call.   The patient was advised to call back and ask for an in-person evaluation if the symptoms worsen or if the condition fails to improve.   Durenda Age, NP [employer] [phone]

## 2018-09-07 DIAGNOSIS — R040 Epistaxis: Secondary | ICD-10-CM | POA: Diagnosis not present

## 2018-09-08 ENCOUNTER — Ambulatory Visit (INDEPENDENT_AMBULATORY_CARE_PROVIDER_SITE_OTHER): Payer: PPO | Admitting: Family

## 2018-09-08 ENCOUNTER — Other Ambulatory Visit: Payer: Self-pay

## 2018-09-08 ENCOUNTER — Encounter: Payer: Self-pay | Admitting: Family

## 2018-09-08 VITALS — BP 169/87

## 2018-09-08 DIAGNOSIS — I10 Essential (primary) hypertension: Secondary | ICD-10-CM | POA: Diagnosis not present

## 2018-09-08 DIAGNOSIS — R040 Epistaxis: Secondary | ICD-10-CM

## 2018-09-08 MED ORDER — HYDROCHLOROTHIAZIDE 12.5 MG PO CAPS: 25.0000 mg | ORAL_CAPSULE | Freq: Every day | ORAL | 3 refills | Status: DC

## 2018-09-08 NOTE — Progress Notes (Deleted)
This service is provided via telemedicine  No vital signs collected/recorded due to the encounter was a telemedicine visit.   Location of patient (ex: home, work):  Home   Patient consents to a telephone visit:  Yes   Location of the provider (ex: office, home):  Office   Name of any referring provider:  Sherrie Mustache, NP  Names of all persons participating in the telemedicine service and their role in the encounter:  Ruthell Rummage CMA, Dinah Ngetich NP, and Lujean Rave Mesquita   Time spent on call:  Ruthell Rummage CMA spent  15 Minutes with patient on phone.     [facility]  Provider: Marlowe Sax, NP  Code Status: FULL Goals of Care:  Advanced Directives 09/04/2018  Does Patient Have a Medical Advance Directive? No  Type of Advance Directive -  Does patient want to make changes to medical advance directive? -  Would patient like information on creating a medical advance directive? No - Patient declined  Pre-existing out of facility DNR order (yellow form or pink MOST form) -     Chief Complaint  Patient presents with  . Acute Visit    Patient c/o bp being high and would like to change bp medication. States seen by ENT for nosebleed    HPI: Patient is a 78 y.o. female seen today for an acute visit for  Past Medical History:  Diagnosis Date  . ALLERGIC RHINITIS   . Anxiety   . Asthma   . Blood type O+   . Cervical strain, acute   . Cirrhosis ([place])   . Colon polyp 2010   adenoma  . Diverticulosis of colon   . Dizziness   . Esophageal varices ([place])   . Fibromyalgia   . GERD (gastroesophageal reflux disease)   . History of nephrolithiasis   . Hypercholesterolemia    borderline  . Hypertension   . IBS (irritable bowel syndrome)   . Osteoarthritis   . Personal history of allergy to unspecified medicinal agent   . Postconcussion syndrome   . Vitamin D deficiency     Past Surgical History:  Procedure Laterality Date  . CHOLECYSTECTOMY, LAPAROSCOPIC  2004    for gallstone pancreatitis  . KNEE SURGERY Right   . KNEE SURGERY Left   . THYROID SURGERY      Allergies  Allergen Reactions  . Ace Inhibitors   . Amoxicillin Itching    REACTION: unknown? pain in right kidney  . Benicar Hct [Olmesartan Medoxomil-Hctz]     Extreme weakness  . Budesonide-Formoterol Fumarate     Causes tremors and numbness  . Chlorzoxazone [Chlorzoxazone]   . Citalopram Hydrobromide     REACTION: hives  . Citalopram Hydrobromide   . Clonidine Derivatives   . Droperidol     REACTION: hives  . Flexeril [Cyclobenzaprine Hcl]   . Fluconazole Nausea And Vomiting and Other (See Comments)    fatigue  . Ketorolac Tromethamine     REACTION: hives  . Ketorolac Tromethamine   . Metoclopramide Hcl   . Mometasone Furo-Formoterol Fum     Causes sore throat, blurred vision and unable to sleep--started on med 09-17-10  . Morphine     REACTION: hives and itching  . Olmesartan Medoxomil     REACTION: fatique  . Breo Ellipta [Fluticasone Furoate-Vilanterol] Itching    Pt reports itching with Memory Dance is related to being lactose intolerant.     Outpatient Encounter Medications as of 09/08/2018  Medication Sig  . ADVAIR  HFA 115-21 MCG/ACT inhaler TAKE 2 PUFFS BY MOUTH TWICE A DAY  . benzonatate (TESSALON PERLES) 100 MG capsule Take 1 capsule (100 mg total) by mouth 3 (three) times daily as needed for cough.  . Calcium Carbonate-Vitamin D (CALCIUM 600+D) 600-400 MG-UNIT tablet Take 1 tablet by mouth 2 (two) times daily.  . Cetirizine HCl (ZYRTEC ALLERGY) 10 MG CAPS Take 10 mg by mouth daily.   . Cholecalciferol (VITAMIN D3) 1000 UNITS CAPS Take 1,000 Units by mouth daily.   . clindamycin (CLEOCIN) 300 MG capsule Take 1 capsule (300 mg total) by mouth 4 (four) times daily. X 7 days  . esomeprazole (NEXIUM) 40 MG capsule Take 1 capsule (40 mg total) by mouth 2 (two) times daily before a meal.  . fluticasone (FLONASE) 50 MCG/ACT nasal spray Place 2 sprays into both nostrils daily  as needed for allergies or rhinitis.  . hydrochlorothiazide (MICROZIDE) 12.5 MG capsule TAKE 1 CAPSULE BY MOUTH EVERY DAY  . KLOR-CON M20 20 MEQ tablet TAKE 1 TABLET BY MOUTH EVERY DAY  . meclizine (ANTIVERT) 25 MG tablet TAKE 1 TABLET 3 TIMES A DAY AS NEEDED FOR DIZZINESS/NAUSEA  . Multiple Vitamins-Minerals (CENTRUM SILVER PO) Take 1 tablet by mouth daily.   . Omega-3 Fatty Acids (FISH OIL MAXIMUM STRENGTH) 1200 MG CAPS Take 1,200 mg by mouth 2 (two) times daily.   Marland Kitchen PROAIR HFA 108 (90 Base) MCG/ACT inhaler INHALE 2 PUFFS EVERY 4 HOURS AS NEEDED INTO THE LUNGS FOR WHEEZING OR SHORTNESS OF BREATH  . propranolol (INDERAL) 10 MG tablet Take 1 tablet (10 mg total) by mouth 2 (two) times daily.  Marland Kitchen Propylene Glycol (SYSTANE BALANCE) 0.6 % SOLN Place 1 drop into both ears 2 (two) times daily.  Marland Kitchen SALINE NASAL SPRAY NA Place 1 spray into both nostrils 2 (two) times daily.    No facility-administered encounter medications on file as of 09/08/2018.     Review of Systems:  Review of Systems  Health Maintenance  Topic Date Due  . INFLUENZA VACCINE  12/12/2018  . COLONOSCOPY  06/25/2020  . TETANUS/TDAP  12/07/2023  . DEXA SCAN  Completed  . PNA vac Low Risk Adult  Completed    Physical Exam: There were no vitals filed for this visit. There is no height or weight on file to calculate BMI. Physical Exam  Labs reviewed: Basic Metabolic Panel: Recent Labs    06/18/18 0947 06/25/18 0943 09/02/18 0218  NA 141 143 137  K 4.4 4.4 3.9  CL 103 103 101  CO2 30 32 23  GLUCOSE 107* 110* 143*  BUN 20 20 17   CREATININE 0.79 0.75 0.80  CALCIUM 9.9 9.9 9.6   Liver Function Tests: Recent Labs    12/18/17 1525  06/18/18 0947 06/25/18 0943 09/02/18 0218  AST 29   < > 76* 69* 56*  ALT 33   < > 96* 91* 66*  ALKPHOS 102  --   --  89 123  BILITOT 0.5   < > 0.6 0.8 0.8  PROT 6.7   < > 6.8 6.8 6.3*  ALBUMIN 4.0  --   --  4.4 3.6   < > = values in this interval not displayed.   No results for  input(s): LIPASE, AMYLASE in the last 8760 hours. No results for input(s): AMMONIA in the last 8760 hours. CBC: Recent Labs    03/30/18 1106 06/18/18 0947 09/02/18 0218  WBC 8.1 8.7 9.2  NEUTROABS 3,540 4,411 3.7  HGB 15.1 15.3  14.7  HCT 44.4 45.5* 44.1  MCV 83.8 86.5 87.5  PLT 293 311 268   Lipid Panel: Recent Labs    03/30/18 1106 06/18/18 0947  CHOL 193 201*  HDL 40* 46*  LDLCALC 117* 118*  TRIG 238* 261*  CHOLHDL 4.8 4.4   Lab Results  Component Value Date   HGBA1C 6.2 (H) 03/30/2018    Procedures since last visit: Ct Head Wo Contrast  Result Date: 09/02/2018 CLINICAL DATA:  Epistaxis EXAM: CT HEAD WITHOUT CONTRAST TECHNIQUE: Contiguous axial images were obtained from the base of the skull through the vertex without intravenous contrast. COMPARISON:  Earlier today FINDINGS: Brain: There is atrophy and chronic small vessel disease changes. No acute intracranial abnormality. Specifically, no hemorrhage, hydrocephalus, mass lesion, acute infarction, or significant intracranial injury. Vascular: No hyperdense vessel or unexpected calcification. Skull: No acute calvarial abnormality. Sinuses/Orbits: Air-fluid level in the left maxillary sinus. Mastoid air cells clear. Other: None IMPRESSION: Atrophy, chronic microvascular disease. No acute intracranial abnormality. Air-fluid level in the left maxillary sinus Electronically Signed   By: Rolm Baptise M.D.   On: 09/02/2018 23:40   Ct Head Wo Contrast  Result Date: 09/02/2018 CLINICAL DATA:  Headache EXAM: CT HEAD WITHOUT CONTRAST TECHNIQUE: Contiguous axial images were obtained from the base of the skull through the vertex without intravenous contrast. COMPARISON:  04/29/2007 FINDINGS: Brain: There is no mass, hemorrhage or extra-axial collection. The size and configuration of the ventricles and extra-axial CSF spaces are normal. There is hypoattenuation of the white matter, most commonly indicating chronic small vessel disease.  Vascular: No abnormal hyperdensity of the major intracranial arteries or dural venous sinuses. No intracranial atherosclerosis. Skull: The visualized skull base, calvarium and extracranial soft tissues are normal. Sinuses/Orbits: Partial opacification of the left maxillary sinus with fluid level. The orbits are normal. IMPRESSION: 1. Chronic ischemic microangiopathy without acute intracranial abnormality. 2. Fluid level in left maxillary sinus. Correlate for symptoms of acute sinusitis. Electronically Signed   By: Ulyses Jarred M.D.   On: 09/02/2018 03:07    Assessment/Plan There are no diagnoses linked to this encounter.   Labs/tests ordered:  @ORDERS @ Next appt:  10/15/2018

## 2018-09-08 NOTE — Progress Notes (Addendum)
This service is provided via telemedicine  No vital signs collected/recorded due to the encounter was a telemedicine visit.   Location of patient (ex: home, work):  Home   Patient consents to a telephone visit:  Yes   Location of the provider (ex: office, home):  Office   Name of any referring provider:  Sherrie Mustache, NP  Names of all persons participating in the telemedicine service and their role in the encounter:  Ruthell Rummage CMA, Kaytlen Lightsey NP, and Lujean Rave Henckel   Time spent on call:  Ruthell Rummage CMA spent  15 Minutes with patient on phone.     [facility]  Provider: Marlowe Sax, NP  Code Status: FULL Goals of Care:  Advanced Directives 09/08/2018  Does Patient Have a Medical Advance Directive? No  Type of Advance Directive -  Does patient want to make changes to medical advance directive? -  Would patient like information on creating a medical advance directive? No - Patient declined  Pre-existing out of facility DNR order (yellow form or pink MOST form) -     Chief Complaint  Patient presents with  . Acute Visit    Patient c/o bp being high and would like to change bp medication. States seen by ENT for nosebleed  . Medication Management    patient is concerned if she should continue taking zyrtec     HPI: Patient is a 78 y.o. female seen today for an acute visit for high blood pressure.she states has been to the ED several times x 5 since 09/02/2018 for nose bleed.she had her left nare cauterized yesterday.she states was advised to stop taking her Aspirin and using Flonase.Her nose bleed has improved since then.she had small amounts of brownish drainage this morning but none since then.she is would also like to know whether she can continue on saline spray.she denies any fever,chill,headache,faintess or changes in vision.she states she has been worried of the nose bleed and high blood pressure since she has family members/relatives  who died due to  stroke and aneurysm.   Past Medical History:  Diagnosis Date  . ALLERGIC RHINITIS   . Anxiety   . Asthma   . Blood type O+   . Cervical strain, acute   . Cirrhosis ([place])   . Colon polyp 2010   adenoma  . Diverticulosis of colon   . Dizziness   . Esophageal varices ([place])   . Fibromyalgia   . GERD (gastroesophageal reflux disease)   . History of nephrolithiasis   . Hypercholesterolemia    borderline  . Hypertension   . IBS (irritable bowel syndrome)   . Osteoarthritis   . Personal history of allergy to unspecified medicinal agent   . Postconcussion syndrome   . Vitamin D deficiency     Past Surgical History:  Procedure Laterality Date  . CHOLECYSTECTOMY, LAPAROSCOPIC  2004   for gallstone pancreatitis  . KNEE SURGERY Right   . KNEE SURGERY Left   . THYROID SURGERY      Allergies  Allergen Reactions  . Ace Inhibitors   . Amoxicillin Itching    REACTION: unknown? pain in right kidney  . Benicar Hct [Olmesartan Medoxomil-Hctz]     Extreme weakness  . Budesonide-Formoterol Fumarate     Causes tremors and numbness  . Chlorzoxazone [Chlorzoxazone]   . Citalopram Hydrobromide     REACTION: hives  . Citalopram Hydrobromide   . Clonidine Derivatives   . Droperidol     REACTION: hives  .  Flexeril [Cyclobenzaprine Hcl]   . Fluconazole Nausea And Vomiting and Other (See Comments)    fatigue  . Ketorolac Tromethamine     REACTION: hives  . Ketorolac Tromethamine   . Metoclopramide Hcl   . Mometasone Furo-Formoterol Fum     Causes sore throat, blurred vision and unable to sleep--started on med 09-17-10  . Morphine     REACTION: hives and itching  . Olmesartan Medoxomil     REACTION: fatique  . Breo Ellipta [Fluticasone Furoate-Vilanterol] Itching    Pt reports itching with Memory Dance is related to being lactose intolerant.     Outpatient Encounter Medications as of 09/08/2018  Medication Sig  . ADVAIR HFA 115-21 MCG/ACT inhaler TAKE 2 PUFFS BY MOUTH TWICE A DAY  .  benzonatate (TESSALON PERLES) 100 MG capsule Take 1 capsule (100 mg total) by mouth 3 (three) times daily as needed for cough.  . Calcium Carbonate-Vitamin D (CALCIUM 600+D) 600-400 MG-UNIT tablet Take 1 tablet by mouth 2 (two) times daily.  . Cetirizine HCl (ZYRTEC ALLERGY) 10 MG CAPS Take 10 mg by mouth daily.   . Cholecalciferol (VITAMIN D3) 1000 UNITS CAPS Take 1,000 Units by mouth daily.   . clindamycin (CLEOCIN) 300 MG capsule Take 1 capsule (300 mg total) by mouth 4 (four) times daily. X 7 days  . esomeprazole (NEXIUM) 40 MG capsule Take 1 capsule (40 mg total) by mouth 2 (two) times daily before a meal.  . hydrochlorothiazide (MICROZIDE) 12.5 MG capsule TAKE 1 CAPSULE BY MOUTH EVERY DAY  . KLOR-CON M20 20 MEQ tablet TAKE 1 TABLET BY MOUTH EVERY DAY  . meclizine (ANTIVERT) 25 MG tablet TAKE 1 TABLET 3 TIMES A DAY AS NEEDED FOR DIZZINESS/NAUSEA  . Multiple Vitamins-Minerals (CENTRUM SILVER PO) Take 1 tablet by mouth daily.   . Omega-3 Fatty Acids (FISH OIL MAXIMUM STRENGTH) 1200 MG CAPS Take 1,200 mg by mouth 2 (two) times daily.   Marland Kitchen PROAIR HFA 108 (90 Base) MCG/ACT inhaler INHALE 2 PUFFS EVERY 4 HOURS AS NEEDED INTO THE LUNGS FOR WHEEZING OR SHORTNESS OF BREATH  . propranolol (INDERAL) 10 MG tablet Take 1 tablet (10 mg total) by mouth 2 (two) times daily.  Marland Kitchen Propylene Glycol (SYSTANE BALANCE) 0.6 % SOLN Place 1 drop into both ears 2 (two) times daily.  Marland Kitchen SALINE NASAL SPRAY NA Place 1 spray into both nostrils 2 (two) times daily.   . [DISCONTINUED] fluticasone (FLONASE) 50 MCG/ACT nasal spray Place 2 sprays into both nostrils daily as needed for allergies or rhinitis.   No facility-administered encounter medications on file as of 09/08/2018.     Review of Systems:  Review of Systems Review of Systems  Constitutional: Negative for chills, diaphoresis, fever, malaise/fatigue and weight loss.  HENT: Negative for congestion, sinus pain and sore throat.        Nose bleed per HPI   Eyes:  Negative for blurred vision, discharge and redness.  Respiratory: Negative for shortness of breath and wheezing.        Chronic cough has not worsen   Cardiovascular: Negative for chest pain, palpitations and leg swelling.  Gastrointestinal: Negative for abdominal pain, constipation, diarrhea, melena, nausea and vomiting.  Genitourinary: Negative for dysuria, flank pain, frequency, hematuria and urgency.  Musculoskeletal: Negative for falls and myalgias.  Neurological: Negative for headaches.       Chronic dizziness takes Meclizine   Psychiatric/Behavioral: Negative for depression. The patient is not nervous/anxious and does not have insomnia.      Health  Maintenance  Topic Date Due  . INFLUENZA VACCINE  12/12/2018  . COLONOSCOPY  06/25/2020  . TETANUS/TDAP  12/07/2023  . DEXA SCAN  Completed  . PNA vac Low Risk Adult  Completed    Physical Exam: Vitals:   09/08/18 0930  BP: (!) 169/87   There is no height or weight on file to calculate BMI.  Physical Exam Unable to complete on telephone visit.  Labs reviewed: Basic Metabolic Panel: Recent Labs    06/18/18 0947 06/25/18 0943 09/02/18 0218  NA 141 143 137  K 4.4 4.4 3.9  CL 103 103 101  CO2 30 32 23  GLUCOSE 107* 110* 143*  BUN 20 20 17   CREATININE 0.79 0.75 0.80  CALCIUM 9.9 9.9 9.6   Liver Function Tests: Recent Labs    12/18/17 1525  06/18/18 0947 06/25/18 0943 09/02/18 0218  AST 29   < > 76* 69* 56*  ALT 33   < > 96* 91* 66*  ALKPHOS 102  --   --  89 123  BILITOT 0.5   < > 0.6 0.8 0.8  PROT 6.7   < > 6.8 6.8 6.3*  ALBUMIN 4.0  --   --  4.4 3.6   < > = values in this interval not displayed.   CBC: Recent Labs    03/30/18 1106 06/18/18 0947 09/02/18 0218  WBC 8.1 8.7 9.2  NEUTROABS 3,540 4,411 3.7  HGB 15.1 15.3 14.7  HCT 44.4 45.5* 44.1  MCV 83.8 86.5 87.5  PLT 293 311 268   Lipid Panel: Recent Labs    03/30/18 1106 06/18/18 0947  CHOL 193 201*  HDL 40* 46*  LDLCALC 117* 118*  TRIG  238* 261*  CHOLHDL 4.8 4.4   Lab Results  Component Value Date   HGBA1C 6.2 (H) 03/30/2018    Procedures since last visit: Ct Head Wo Contrast  Result Date: 09/02/2018 CLINICAL DATA:  Epistaxis EXAM: CT HEAD WITHOUT CONTRAST TECHNIQUE: Contiguous axial images were obtained from the base of the skull through the vertex without intravenous contrast. COMPARISON:  Earlier today FINDINGS: Brain: There is atrophy and chronic small vessel disease changes. No acute intracranial abnormality. Specifically, no hemorrhage, hydrocephalus, mass lesion, acute infarction, or significant intracranial injury. Vascular: No hyperdense vessel or unexpected calcification. Skull: No acute calvarial abnormality. Sinuses/Orbits: Air-fluid level in the left maxillary sinus. Mastoid air cells clear. Other: None IMPRESSION: Atrophy, chronic microvascular disease. No acute intracranial abnormality. Air-fluid level in the left maxillary sinus Electronically Signed   By: Rolm Baptise M.D.   On: 09/02/2018 23:40   Ct Head Wo Contrast  Result Date: 09/02/2018 CLINICAL DATA:  Headache EXAM: CT HEAD WITHOUT CONTRAST TECHNIQUE: Contiguous axial images were obtained from the base of the skull through the vertex without intravenous contrast. COMPARISON:  04/29/2007 FINDINGS: Brain: There is no mass, hemorrhage or extra-axial collection. The size and configuration of the ventricles and extra-axial CSF spaces are normal. There is hypoattenuation of the white matter, most commonly indicating chronic small vessel disease. Vascular: No abnormal hyperdensity of the major intracranial arteries or dural venous sinuses. No intracranial atherosclerosis. Skull: The visualized skull base, calvarium and extracranial soft tissues are normal. Sinuses/Orbits: Partial opacification of the left maxillary sinus with fluid level. The orbits are normal. IMPRESSION: 1. Chronic ischemic microangiopathy without acute intracranial abnormality. 2. Fluid level in  left maxillary sinus. Correlate for symptoms of acute sinusitis. Electronically Signed   By: Ulyses Jarred M.D.   On: 09/02/2018 03:07  Assessment/Plan 1. Essential hypertension B/p log reviewed readings 150's/80's- 170's/80's .denies any headache,chest pain ,nausea or vomiting.Continue on propranolol 10 mg tablet twice daily.increase Hydrochlorothiazide from 12.5 mg capsule to 25 mg capsule daily  - encourage to monitor blood pressure daily and notify provider's office if 3 consecutive readings are > 160/90    2. Epistaxis Has improved since cauterized 09/06/2018.continue to hold aspirin. May resume saline nasal spray to keep nares.  - Flonase d/ced in the ED. Notify provider if bleeding recurs.  Labs/tests ordered: None  Next appt:  10/15/2018

## 2018-09-14 ENCOUNTER — Ambulatory Visit (INDEPENDENT_AMBULATORY_CARE_PROVIDER_SITE_OTHER): Payer: PPO | Admitting: Nurse Practitioner

## 2018-09-14 ENCOUNTER — Encounter: Payer: Self-pay | Admitting: Nurse Practitioner

## 2018-09-14 ENCOUNTER — Other Ambulatory Visit: Payer: Self-pay

## 2018-09-14 ENCOUNTER — Telehealth: Payer: Self-pay | Admitting: *Deleted

## 2018-09-14 DIAGNOSIS — I10 Essential (primary) hypertension: Secondary | ICD-10-CM | POA: Diagnosis not present

## 2018-09-14 DIAGNOSIS — R040 Epistaxis: Secondary | ICD-10-CM

## 2018-09-14 MED ORDER — HYDROCHLOROTHIAZIDE 25 MG PO TABS: 25.0000 mg | ORAL_TABLET | Freq: Every day | ORAL | 3 refills | Status: DC

## 2018-09-14 MED ORDER — AMLODIPINE BESYLATE 2.5 MG PO TABS: 2.5000 mg | ORAL_TABLET | Freq: Every day | ORAL | 1 refills | Status: DC

## 2018-09-14 NOTE — Patient Outreach (Addendum)
[facility]) Care Management  09/14/2018  [name] 10-09-1940 [id number]  Nurse Call Line Referral Date: 09/14/18 Reason for Referral: Request information question regarding elevated blood pressure. Per call line nurse patient had no symptoms at time of call.  Nurse call line recommendation: Counseling  Attempt #1  Telephone call to patient regarding referral. Unable to reach patient. HIPAA compliant voice message left with call back phone number.   PLAN: RNCM will attempt 2nd telephone call to patient within 4 business days. RNCM will send outreach letter.   Quinn Plowman RN, BSN, [place] [facility]  [phone]

## 2018-09-14 NOTE — Patient Instructions (Addendum)
To ADD amlodipine 2.5 mg by mouth daily  To continue propranolol 10 mg by mouth twice daily and Hydrochlorothiazide 25 mg by mouth daily  To decrease sodium intake to help with blood pressure (process and can foods are generally higher in sodium)  SEE INFORMATION BELOW on diet    DASH Eating Plan DASH stands for "Dietary Approaches to Stop Hypertension." The DASH eating plan is a healthy eating plan that has been shown to reduce high blood pressure (hypertension). It may also reduce your risk for type 2 diabetes, heart disease, and stroke. The DASH eating plan may also help with weight loss. What are tips for following this plan?  General guidelines  Avoid eating more than 2,300 mg (milligrams) of salt (sodium) a day. If you have hypertension, you may need to reduce your sodium intake to 1,500 mg a day.  Limit alcohol intake to no more than 1 drink a day for nonpregnant women and 2 drinks a day for men. One drink equals 12 oz of beer, 5 oz of wine, or 1 oz of hard liquor.  Work with your health care provider to maintain a healthy body weight or to lose weight. Ask what an ideal weight is for you.  Get at least 30 minutes of exercise that causes your heart to beat faster (aerobic exercise) most days of the week. Activities may include walking, swimming, or biking.  Work with your health care provider or diet and nutrition specialist (dietitian) to adjust your eating plan to your individual calorie needs. Reading food labels   Check food labels for the amount of sodium per serving. Choose foods with less than 5 percent of the Daily Value of sodium. Generally, foods with less than 300 mg of sodium per serving fit into this eating plan.  To find whole grains, look for the word "whole" as the first word in the ingredient list. Shopping  Buy products labeled as "low-sodium" or "no salt added."  Buy fresh foods. Avoid canned foods and premade or frozen meals. Cooking  Avoid adding salt  when cooking. Use salt-free seasonings or herbs instead of table salt or sea salt. Check with your health care provider or pharmacist before using salt substitutes.  Do not fry foods. Cook foods using healthy methods such as baking, boiling, grilling, and broiling instead.  Cook with heart-healthy oils, such as olive, canola, soybean, or sunflower oil. Meal planning  Eat a balanced diet that includes: ? 5 or more servings of fruits and vegetables each day. At each meal, try to fill half of your plate with fruits and vegetables. ? Up to 6-8 servings of whole grains each day. ? Less than 6 oz of lean meat, poultry, or fish each day. A 3-oz serving of meat is about the same size as a deck of cards. One egg equals 1 oz. ? 2 servings of low-fat dairy each day. ? A serving of nuts, seeds, or beans 5 times each week. ? Heart-healthy fats. Healthy fats called Omega-3 fatty acids are found in foods such as flaxseeds and coldwater fish, like sardines, salmon, and mackerel.  Limit how much you eat of the following: ? Canned or prepackaged foods. ? Food that is high in trans fat, such as fried foods. ? Food that is high in saturated fat, such as fatty meat. ? Sweets, desserts, sugary drinks, and other foods with added sugar. ? Full-fat dairy products.  Do not salt foods before eating.  Try to eat at least 2 vegetarian  meals each week.  Eat more home-cooked food and less restaurant, buffet, and fast food.  When eating at a restaurant, ask that your food be prepared with less salt or no salt, if possible. What foods are recommended? The items listed may not be a complete list. Talk with your dietitian about what dietary choices are best for you. Grains Whole-grain or whole-wheat bread. Whole-grain or whole-wheat pasta. Brown rice. [place]ena Morrow. Bulgur. Whole-grain and low-sodium cereals. Pita bread. Low-fat, low-sodium crackers. Whole-wheat flour tortillas. Vegetables Fresh or frozen  vegetables (raw, steamed, roasted, or grilled). Low-sodium or reduced-sodium tomato and vegetable juice. Low-sodium or reduced-sodium tomato sauce and tomato paste. Low-sodium or reduced-sodium canned vegetables. Fruits All fresh, dried, or frozen fruit. Canned fruit in natural juice (without added sugar). Meat and other protein foods Skinless chicken or [place]. Ground chicken or [place]. Pork with fat trimmed off. Fish and seafood. Egg whites. Dried beans, peas, or lentils. Unsalted nuts, nut butters, and seeds. Unsalted canned beans. Lean cuts of beef with fat trimmed off. Low-sodium, lean deli meat. Dairy Low-fat (1%) or fat-free (skim) milk. Fat-free, low-fat, or reduced-fat cheeses. Nonfat, low-sodium ricotta or cottage cheese. Low-fat or nonfat yogurt. Low-fat, low-sodium cheese. Fats and oils Soft margarine without trans fats. Vegetable oil. Low-fat, reduced-fat, or light mayonnaise and salad dressings (reduced-sodium). Canola, safflower, olive, soybean, and sunflower oils. Avocado. Seasoning and other foods Herbs. Spices. Seasoning mixes without salt. Unsalted popcorn and pretzels. Fat-free sweets. What foods are not recommended? The items listed may not be a complete list. Talk with your dietitian about what dietary choices are best for you. Grains Baked goods made with fat, such as croissants, muffins, or some breads. Dry pasta or rice meal packs. Vegetables Creamed or fried vegetables. Vegetables in a cheese sauce. Regular canned vegetables (not low-sodium or reduced-sodium). Regular canned tomato sauce and paste (not low-sodium or reduced-sodium). Regular tomato and vegetable juice (not low-sodium or reduced-sodium). Angie Fava. Olives. Fruits Canned fruit in a light or heavy syrup. Fried fruit. Fruit in cream or butter sauce. Meat and other protein foods Fatty cuts of meat. Ribs. Fried meat. Berniece Salines. Sausage. Bologna and other processed lunch meats. Salami. Fatback. Hotdogs. Bratwurst.  Salted nuts and seeds. Canned beans with added salt. Canned or smoked fish. Whole eggs or egg yolks. Chicken or [place] with skin. Dairy Whole or 2% milk, cream, and half-and-half. Whole or full-fat cream cheese. Whole-fat or sweetened yogurt. Full-fat cheese. Nondairy creamers. Whipped toppings. Processed cheese and cheese spreads. Fats and oils Butter. Stick margarine. Lard. Shortening. Ghee. Bacon fat. Tropical oils, such as coconut, palm kernel, or palm oil. Seasoning and other foods Salted popcorn and pretzels. Onion salt, garlic salt, seasoned salt, table salt, and sea salt. Worcestershire sauce. Tartar sauce. Barbecue sauce. Teriyaki sauce. Soy sauce, including reduced-sodium. Steak sauce. Canned and packaged gravies. Fish sauce. Oyster sauce. Cocktail sauce. Horseradish that you find on the shelf. Ketchup. Mustard. Meat flavorings and tenderizers. Bouillon cubes. Hot sauce and Tabasco sauce. Premade or packaged marinades. Premade or packaged taco seasonings. Relishes. Regular salad dressings. Where to find more information:  National Heart, Lung, and [place]: https://wilson-eaton.com/  American Heart Association: www.heart.org Summary  The DASH eating plan is a healthy eating plan that has been shown to reduce high blood pressure (hypertension). It may also reduce your risk for type 2 diabetes, heart disease, and stroke.  With the DASH eating plan, you should limit salt (sodium) intake to 2,300 mg a day. If you have hypertension, you may need to  reduce your sodium intake to 1,500 mg a day.  When on the DASH eating plan, aim to eat more fresh fruits and vegetables, whole grains, lean proteins, low-fat dairy, and heart-healthy fats.  Work with your health care provider or diet and nutrition specialist (dietitian) to adjust your eating plan to your individual calorie needs. This information is not intended to replace advice given to you by your health care provider. Make sure you discuss any  questions you have with your health care provider. Document Released: 04/18/2011 Document Revised: 04/22/2016 Document Reviewed: 04/22/2016 Elsevier Interactive Patient Education  2019 [employer].

## 2018-09-14 NOTE — Telephone Encounter (Signed)
This weekend patient spoke with on-call provider about elevated B/P. Patient was told on call provider would consult with Janett Billow and have someone call her to schedule a Telephone Visit. Patient was calling to see if any conversation took place.  Patient's recent B/P readings:171/98, 174/82, 82 154/73, 161/84, 154/73    Patient denies any associated symptoms such as but not limited to blurred vision, chest pain, dizziness, faint feeling, headache, or increased fatigue.  I scheduled patient for a telephone visit today at 2:00 pm

## 2018-09-14 NOTE — Progress Notes (Signed)
This service is provided via telemedicine  No vital signs collected/recorded due to the encounter was a telemedicine visit.   Location of patient (ex: home, work):Home  Patient consents to a telephone visit: YES  Location of the provider (ex: office, home): [employer]   Name of any referring provider:Zack Crager NP   Names of all persons participating in the telemedicine service and their role in the encounter: Vonna Kotyk CMA, Patient Carla Burows NP  Time spent on call:  57mn  Patient has an appointment for elevated blood pressure which she feels has to do with her anxiety. Patient had a nose cauterzation 2 times at ED and 1 time at Dr office.  Virtual Visit via Telephone Note  I connected with Carla LIZANAon 09/14/18 at  2:00 PM EDT by telephone and verified that I am speaking with the correct person using two identifiers.  Location: Patient: home Provider: office   I discussed the limitations, risks, security and privacy concerns of performing an evaluation and management service by telephone and the availability of in person appointments. I also discussed with the patient that there may be a patient responsible charge related to this service. The patient expressed understanding and agreed to proceed.     Careteam: Patient Care Team: ELauree Chandler NP as PCP - General (Nurse Practitioner) GDannielle Karvonen RN as [place]oklyn HeightsManagement  Advanced Directive information Does Patient Have a Medical Advance Directive?: Yes, Type of Advance Directive: Out of facility DNR (pink MOST or yellow form), Pre-existing out of facility DNR order (yellow form or pink MOST form): Yellow form placed in chart (order not valid for inpatient use), Does patient want to make changes to medical advance directive?: No - Patient declined  Allergies  Allergen Reactions  . Ace Inhibitors   . Amoxicillin Itching    REACTION:  unknown? pain in right kidney  . Benicar Hct [Olmesartan Medoxomil-Hctz]     Extreme weakness  . Budesonide-Formoterol Fumarate     Causes tremors and numbness  . Chlorzoxazone [Chlorzoxazone]   . Citalopram Hydrobromide     REACTION: hives  . Citalopram Hydrobromide   . Clonidine Derivatives   . Droperidol     REACTION: hives  . Flexeril [Cyclobenzaprine Hcl]   . Fluconazole Nausea And Vomiting and Other (See Comments)    fatigue  . Ketorolac Tromethamine     REACTION: hives  . Ketorolac Tromethamine   . Metoclopramide Hcl   . Mometasone Furo-Formoterol Fum     Causes sore throat, blurred vision and unable to sleep--started on med 09-17-10  . Morphine     REACTION: hives and itching  . Olmesartan Medoxomil     REACTION: fatique  . Breo Ellipta [Fluticasone Furoate-Vilanterol] Itching    Pt reports itching with BMemory Danceis related to being lactose intolerant.     Chief Complaint  Patient presents with  . Acute Visit    elevated blood presure,Patient had a total of 3 nose cauterizations 2 at Ed and 1 at Dr office      HPI: Patient is a 78y.o. female for elevated blood pressure. Pt called 09/08/2018 and her blood pressure was elevated and HCTZ was increased to 25 mg was added. Blood pressure has remained elevated. Patient's recent B/P readings:171/98, 174/82, 154/73, 161/84, 154/73   She was previously having headaches but this has improved.  Patient denies any associated symptoms currently such as but not limited to blurred vision, chest pain,  dizziness, faint feeling, headache, or increased fatigue. She is currently taking propranolol 10 mg BID and HCTZ 25 daily Has had recurrent nose bleeds and following with ENT at this time.  No nose bleeds in 5 days.  Has eaten more processed meals and overall foods higher in sodium.   155/72 today Review of Systems:  Review of Systems  Constitutional: Negative for chills and fever.  HENT: Positive for nosebleeds (hx of recurrent nose  bleeds, following with ENT, none in 5 days.).   Respiratory: Negative for cough, sputum production and shortness of breath.   Cardiovascular: Negative for chest pain, palpitations and leg swelling.  Neurological: Negative for dizziness, sensory change, speech change, weakness and headaches.    Past Medical History:  Diagnosis Date  . ALLERGIC RHINITIS   . Anxiety   . Asthma   . Blood type O+   . Cervical strain, acute   . Cirrhosis ([place])   . Colon polyp 2010   adenoma  . Diverticulosis of colon   . Dizziness   . Esophageal varices ([place])   . Fibromyalgia   . GERD (gastroesophageal reflux disease)   . History of nephrolithiasis   . Hypercholesterolemia    borderline  . Hypertension   . IBS (irritable bowel syndrome)   . Osteoarthritis   . Personal history of allergy to unspecified medicinal agent   . Postconcussion syndrome   . Vitamin D deficiency    Past Surgical History:  Procedure Laterality Date  . CHOLECYSTECTOMY, LAPAROSCOPIC  2004   for gallstone pancreatitis  . KNEE SURGERY Right   . KNEE SURGERY Left   . THYROID SURGERY     Social History:   reports that she quit smoking about 56 years ago. Her smoking use included cigarettes. She has a 16.00 pack-year smoking history. She has never used smokeless tobacco. She reports that she does not drink alcohol or use drugs.  Family History  Problem Relation Age of Onset  . Breast cancer Mother   . Alzheimer's disease Mother   . Heart disease Mother   . COPD Brother   . Heart disease Sister   . Breast cancer Sister 53  . Alcohol abuse Sister   . Ovarian cancer Paternal Grandmother   . Arthritis Other        Cousins   . COPD Cousin        Maternal side   . Heart attack Maternal Uncle     Medications: Patient's Medications  New Prescriptions   No medications on file  Previous Medications   ADVAIR HFA 115-21 MCG/ACT INHALER    TAKE 2 PUFFS BY MOUTH TWICE A DAY   BENZONATATE (TESSALON PERLES) 100 MG CAPSULE     Take 1 capsule (100 mg total) by mouth 3 (three) times daily as needed for cough.   CALCIUM CARBONATE-VITAMIN D (CALCIUM 600+D) 600-400 MG-UNIT TABLET    Take 1 tablet by mouth 2 (two) times daily.   CETIRIZINE HCL (ZYRTEC ALLERGY) 10 MG CAPS    Take 10 mg by mouth daily.    CHOLECALCIFEROL (VITAMIN D3) 1000 UNITS CAPS    Take 1,000 Units by mouth daily.    ESOMEPRAZOLE (NEXIUM) 40 MG CAPSULE    Take 1 capsule (40 mg total) by mouth 2 (two) times daily before a meal.   HYDROCHLOROTHIAZIDE (MICROZIDE) 12.5 MG CAPSULE    Take 2 capsules (25 mg total) by mouth daily.   KLOR-CON M20 20 MEQ TABLET    TAKE 1 TABLET BY MOUTH  EVERY DAY   MECLIZINE (ANTIVERT) 25 MG TABLET    TAKE 1 TABLET 3 TIMES A DAY AS NEEDED FOR DIZZINESS/NAUSEA   MULTIPLE VITAMINS-MINERALS (CENTRUM SILVER PO)    Take 1 tablet by mouth daily.    OMEGA-3 FATTY ACIDS (FISH OIL MAXIMUM STRENGTH) 1200 MG CAPS    Take 1,200 mg by mouth 2 (two) times daily.    PROAIR HFA 108 (90 BASE) MCG/ACT INHALER    INHALE 2 PUFFS EVERY 4 HOURS AS NEEDED INTO THE LUNGS FOR WHEEZING OR SHORTNESS OF BREATH   PROPRANOLOL (INDERAL) 10 MG TABLET    Take 1 tablet (10 mg total) by mouth 2 (two) times daily.   PROPYLENE GLYCOL (SYSTANE BALANCE) 0.6 % SOLN    Place 1 drop into both ears 2 (two) times daily.   SALINE NASAL SPRAY NA    Place 1 spray into both nostrils 2 (two) times daily.   Modified Medications   No medications on file  Discontinued Medications   CLINDAMYCIN (CLEOCIN) 300 MG CAPSULE    Take 1 capsule (300 mg total) by mouth 4 (four) times daily. X 7 days     Physical Exam: unable due to televist    Labs reviewed: Basic Metabolic Panel: Recent Labs    06/18/18 0947 06/25/18 0943 09/02/18 0218  NA 141 143 137  K 4.4 4.4 3.9  CL 103 103 101  CO2 30 32 23  GLUCOSE 107* 110* 143*  BUN 20 20 17   CREATININE 0.79 0.75 0.80  CALCIUM 9.9 9.9 9.6   Liver Function Tests: Recent Labs    12/18/17 1525  06/18/18 0947 06/25/18 0943  09/02/18 0218  AST 29   < > 76* 69* 56*  ALT 33   < > 96* 91* 66*  ALKPHOS 102  --   --  89 123  BILITOT 0.5   < > 0.6 0.8 0.8  PROT 6.7   < > 6.8 6.8 6.3*  ALBUMIN 4.0  --   --  4.4 3.6   < > = values in this interval not displayed.   No results for input(s): LIPASE, AMYLASE in the last 8760 hours. No results for input(s): AMMONIA in the last 8760 hours. CBC: Recent Labs    03/30/18 1106 06/18/18 0947 09/02/18 0218  WBC 8.1 8.7 9.2  NEUTROABS 3,540 4,411 3.7  HGB 15.1 15.3 14.7  HCT 44.4 45.5* 44.1  MCV 83.8 86.5 87.5  PLT 293 311 268   Lipid Panel: Recent Labs    03/30/18 1106 06/18/18 0947  CHOL 193 201*  HDL 40* 46*  LDLCALC 117* 118*  TRIG 238* 261*  CHOLHDL 4.8 4.4   TSH: No results for input(s): TSH in the last 8760 hours. A1C: Lab Results  Component Value Date   HGBA1C 6.2 (H) 03/30/2018     Assessment/Plan 1. Essential hypertension Not controlled. Apparently diet has been higher in sodium recently, educated her that high sodium can increase bp and important to make changes with diet, no added salt and to limit processed and can foods that can be high in sodium. DASH diet given  -will ADD norvasc 2.5 mg by mouth daily at this time and continue pronpanolol and HCTZ - amLODipine (NORVASC) 2.5 MG tablet; Take 1 tablet (2.5 mg total) by mouth daily.  Dispense: 30 tablet; Refill: 1 - hydrochlorothiazide (HYDRODIURIL) 25 MG tablet; Take 1 tablet (25 mg total) by mouth daily.  Dispense: 90 tablet; Refill: 3  2. Epistaxis Multiple visit to ED due  to uncontrolled nose bleeds, following with ENT and since last cauterization has not had another bleed.   Next appt: 10/15/2018, sooner if needed  Carlos American. Harle Battiest  [facility] [phone]   Follow Up Instructions:    I discussed the assessment and treatment plan with the patient. The patient was provided an opportunity to ask questions and all were answered. The patient agreed  with the plan and demonstrated an understanding of the instructions.   The patient was advised to call back or seek an in-person evaluation if the symptoms worsen or if the condition fails to improve as anticipated.  I provided 16 minutes of non-face-to-face time during this encounter.   Lauree Chandler, NP avs printed and mailed.

## 2018-09-14 NOTE — Telephone Encounter (Signed)
Patient called and Left message on Clinical intake to return call.   Tried calling patient back and LMOM to return call.

## 2018-09-15 ENCOUNTER — Other Ambulatory Visit: Payer: Self-pay

## 2018-09-15 NOTE — Patient Outreach (Signed)
[place] [facility]) Care Management  09/15/2018  PAYSON CRUMBY 09-24-1940 [id number]  Nurse Call Line Referral Date: 09/14/18 Reason for Referral: Request information question regarding elevated blood pressure. Per call line nurse patient had no symptoms at time of call.  Nurse call line recommendation: Counseling  Attempt #2  Telephone call to patient regarding nurse call line referral. Unable to reach patient. HIPAA compliant voice message left with call back phone number.   PLAN: RNCM will attempt 3rd telephone call to patient within 4 business days.   Quinn Plowman RN,BSN,CCM [facility] Telephonic  [phone]

## 2018-09-17 ENCOUNTER — Other Ambulatory Visit: Payer: Self-pay

## 2018-09-17 NOTE — Patient Outreach (Signed)
[place] [facility]) Care Management  09/17/2018  [name] [birth year] [id number]  Nurse Call Line Referral Date:09/14/18 Reason for Referral: Request information question regarding elevated blood pressure. Per call line nurse patient had no symptoms at time of call. Nurse call line recommendation:Counseling  ATTEMPT #3   Received return voice mail from patient. RNCM attempted return call to patient. Unable to reach. HIPAA compliant voice message left with call back phone number.   PLAN; If no return call will proceed with closure.   Quinn Plowman RN,BSN,CCM [facility] Telephonic  [phone]

## 2018-09-17 NOTE — Patient Outreach (Addendum)
[place] [facility]) Care Management  09/17/2018  [name] 1941/04/07 [id number]  Nurse Call Line Referral Date:09/14/18 Reason for Referral: Request information question regarding elevated blood pressure. Per call line nurse patient had no symptoms at time of call. Nurse call line recommendation:Counseling  Received voice mail message from patient requesting no additional calls. Patient states on voice mail she is a [employer] team [employer] member and contact the nurse advise line when she needs to.    PLAN: RNCM will close patient due to refusal of services.  RNCM will send closure letter to patients primary MD.    Quinn Plowman RN,BSN,CCM [facility] Telephonic  [phone]

## 2018-09-18 ENCOUNTER — Other Ambulatory Visit: Payer: Self-pay | Admitting: Family

## 2018-09-24 ENCOUNTER — Ambulatory Visit (INDEPENDENT_AMBULATORY_CARE_PROVIDER_SITE_OTHER): Payer: PPO | Admitting: Nurse Practitioner

## 2018-09-24 ENCOUNTER — Other Ambulatory Visit: Payer: Self-pay

## 2018-09-24 ENCOUNTER — Encounter: Payer: Self-pay | Admitting: Nurse Practitioner

## 2018-09-24 DIAGNOSIS — Z0000 Encounter for general adult medical examination without abnormal findings: Secondary | ICD-10-CM | POA: Diagnosis not present

## 2018-09-24 MED ORDER — ZOSTER VAC RECOMB ADJUVANTED 50 MCG/0.5ML IM SUSR: 0.5000 mL | Freq: Once | INTRAMUSCULAR | 1 refills | Status: AC

## 2018-09-24 NOTE — Patient Instructions (Signed)
[name] , Thank you for taking time to come for your Medicare Wellness Visit. I appreciate your ongoing commitment to your health goals. Please review the following plan we discussed and let me know if I can assist you in the future.   Screening recommendations/referrals: Colonoscopy up to date Mammogram aged out Bone Density up to date Recommended yearly ophthalmology/optometry visit for glaucoma screening and checkup Recommended yearly dental visit for hygiene and checkup  Vaccinations: Influenza vaccine up to date, due 12/2018 Pneumococcal vaccine up to date Tdap vaccine up to date Shingles vaccine sent to pharmacy     Advanced directives: please review and complete and bring back to office  Conditions/risks identified: at risk for cardiovascular disease due to risk factors which include weight, sedentary lifestyle, etc  Next appointment: 1 year for AWV   Preventive Care 75 Years and Older, Female Preventive care refers to lifestyle choices and visits with your health care provider that can promote health and wellness. What does preventive care include?  A yearly physical exam. This is also called an annual well check.  Dental exams once or twice a year.  Routine eye exams. Ask your health care provider how often you should have your eyes checked.  Personal lifestyle choices, including:  Daily care of your teeth and gums.  Regular physical activity.  Eating a healthy diet.  Avoiding tobacco and drug use.  Limiting alcohol use.  Practicing safe sex.  Taking low-dose aspirin every day.  Taking vitamin and mineral supplements as recommended by your health care provider. What happens during an annual well check? The services and screenings done by your health care provider during your annual well check will depend on your age, overall health, lifestyle risk factors, and family history of disease. Counseling  Your health care provider may ask you questions about  your:  Alcohol use.  Tobacco use.  Drug use.  Emotional well-being.  Home and relationship well-being.  Sexual activity.  Eating habits.  History of falls.  Memory and ability to understand (cognition).  Work and work Statistician.  Reproductive health. Screening  You may have the following tests or measurements:  Height, weight, and BMI.  Blood pressure.  Lipid and cholesterol levels. These may be checked every 5 years, or more frequently if you are over 73 years old.  Skin check.  Lung cancer screening. You may have this screening every year starting at age 3 if you have a 30-pack-year history of smoking and currently smoke or have quit within the past 15 years.  Fecal occult blood test (FOBT) of the stool. You may have this test every year starting at age 45.  Flexible sigmoidoscopy or colonoscopy. You may have a sigmoidoscopy every 5 years or a colonoscopy every 10 years starting at age 34.  Hepatitis C blood test.  Hepatitis B blood test.  Sexually transmitted disease (STD) testing.  Diabetes screening. This is done by checking your blood sugar (glucose) after you have not eaten for a while (fasting). You may have this done every 1-3 years.  Bone density scan. This is done to screen for osteoporosis. You may have this done starting at age 75.  Mammogram. This may be done every 1-2 years. Talk to your health care provider about how often you should have regular mammograms. Talk with your health care provider about your test results, treatment options, and if necessary, the need for more tests. Vaccines  Your health care provider may recommend certain vaccines, such as:  Influenza  vaccine. This is recommended every year.  Tetanus, diphtheria, and acellular pertussis (Tdap, Td) vaccine. You may need a Td booster every 10 years.  Zoster vaccine. You may need this after age 32.  Pneumococcal 13-valent conjugate (PCV13) vaccine. One dose is recommended  after age 67.  Pneumococcal polysaccharide (PPSV23) vaccine. One dose is recommended after age 31. Talk to your health care provider about which screenings and vaccines you need and how often you need them. This information is not intended to replace advice given to you by your health care provider. Make sure you discuss any questions you have with your health care provider. Document Released: 05/26/2015 Document Revised: 01/17/2016 Document Reviewed: 02/28/2015 Elsevier Interactive Patient Education  2017 [place] Prevention in the Home Falls can cause injuries. They can happen to people of all ages. There are many things you can do to make your home safe and to help prevent falls. What can I do on the outside of my home?  Regularly fix the edges of walkways and driveways and fix any cracks.  Remove anything that might make you trip as you walk through a door, such as a raised step or threshold.  Trim any bushes or trees on the path to your home.  Use bright outdoor lighting.  Clear any walking paths of anything that might make someone trip, such as rocks or tools.  Regularly check to see if handrails are loose or broken. Make sure that both sides of any steps have handrails.  Any raised decks and porches should have guardrails on the edges.  Have any leaves, snow, or ice cleared regularly.  Use sand or salt on walking paths during winter.  Clean up any spills in your garage right away. This includes oil or grease spills. What can I do in the bathroom?  Use night lights.  Install grab bars by the toilet and in the tub and shower. Do not use towel bars as grab bars.  Use non-skid mats or decals in the tub or shower.  If you need to sit down in the shower, use a plastic, non-slip stool.  Keep the floor dry. Clean up any water that spills on the floor as soon as it happens.  Remove soap buildup in the tub or shower regularly.  Attach bath mats securely with  double-sided non-slip rug tape.  Do not have throw rugs and other things on the floor that can make you trip. What can I do in the bedroom?  Use night lights.  Make sure that you have a light by your bed that is easy to reach.  Do not use any sheets or blankets that are too big for your bed. They should not hang down onto the floor.  Have a firm chair that has side arms. You can use this for support while you get dressed.  Do not have throw rugs and other things on the floor that can make you trip. What can I do in the kitchen?  Clean up any spills right away.  Avoid walking on wet floors.  Keep items that you use a lot in easy-to-reach places.  If you need to reach something above you, use a strong step stool that has a grab bar.  Keep electrical cords out of the way.  Do not use floor polish or wax that makes floors slippery. If you must use wax, use non-skid floor wax.  Do not have throw rugs and other things on the floor that can  make you trip. What can I do with my stairs?  Do not leave any items on the stairs.  Make sure that there are handrails on both sides of the stairs and use them. Fix handrails that are broken or loose. Make sure that handrails are as long as the stairways.  Check any carpeting to make sure that it is firmly attached to the stairs. Fix any carpet that is loose or worn.  Avoid having throw rugs at the top or bottom of the stairs. If you do have throw rugs, attach them to the floor with carpet tape.  Make sure that you have a light switch at the top of the stairs and the bottom of the stairs. If you do not have them, ask someone to add them for you. What else can I do to help prevent falls?  Wear shoes that:  Do not have high heels.  Have rubber bottoms.  Are comfortable and fit you well.  Are closed at the toe. Do not wear sandals.  If you use a stepladder:  Make sure that it is fully opened. Do not climb a closed stepladder.  Make  sure that both sides of the stepladder are locked into place.  Ask someone to hold it for you, if possible.  Clearly mark and make sure that you can see:  Any grab bars or handrails.  First and last steps.  Where the edge of each step is.  Use tools that help you move around (mobility aids) if they are needed. These include:  Canes.  Walkers.  Scooters.  Crutches.  Turn on the lights when you go into a dark area. Replace any light bulbs as soon as they burn out.  Set up your furniture so you have a clear path. Avoid moving your furniture around.  If any of your floors are uneven, fix them.  If there are any pets around you, be aware of where they are.  Review your medicines with your doctor. Some medicines can make you feel dizzy. This can increase your chance of falling. Ask your doctor what other things that you can do to help prevent falls. This information is not intended to replace advice given to you by your health care provider. Make sure you discuss any questions you have with your health care provider. Document Released: 02/23/2009 Document Revised: 10/05/2015 Document Reviewed: 06/03/2014 Elsevier Interactive Patient Education  2017 [employer].

## 2018-09-24 NOTE — Progress Notes (Signed)
    This service is provided via telemedicine  No vital signs collected/recorded due to the encounter was a telemedicine visit.   Location of patient (ex: home, work): Home  Patient consents to a telephone visit:  Yes  Location of the provider (ex: office, home):  [facility], Office   Name of any referring provider:  N/A  Names of all persons participating in the telemedicine service and their role in the encounter:  S.Chrae B/CMA, Sherrie Mustache, NP, and Patient   Time spent on call: 15 min with medical assistant

## 2018-09-24 NOTE — Progress Notes (Signed)
Subjective:   [name] is a 78 y.o. female who presents for Medicare Annual (Subsequent) preventive examination.  Review of Systems:   Cardiac Risk Factors include: advanced age (>74mn, >>77women);obesity (BMI >30kg/m2);sedentary lifestyle;family history of premature cardiovascular disease;hypertension     Objective:     Vitals: There were no vitals taken for this visit.  There is no height or weight on file to calculate BMI.  Advanced Directives 09/24/2018 09/14/2018 09/08/2018 09/04/2018 09/02/2018 09/02/2018 03/30/2018  Does Patient Have a Medical Advance Directive? Yes Yes No No No No Yes  Type of Advance Directive Out of facility DNR (pink MOST or yellow form) Out of facility DNR (pink MOST or yellow form) - - - - Out of facility DNR (pink MOST or yellow form)  Does patient want to make changes to medical advance directive? No - Patient declined No - Patient declined - - - - -  Would patient like information on creating a medical advance directive? - - No - Patient declined No - Patient declined No - Patient declined No - Patient declined -  Pre-existing out of facility DNR order (yellow form or pink MOST form) - Yellow form placed in chart (order not valid for inpatient use) - - - - Yellow form placed in chart (order not valid for inpatient use)    Tobacco Social History   Tobacco Use  Smoking Status Former Smoker  . Packs/day: 2.00  . Years: 8.00  . Pack years: 16.00  . Types: Cigarettes  . Last attempt to quit: 05/13/1962  . Years since quitting: 537.4 Smokeless Tobacco Never Used     Counseling given: Not Answered   Clinical Intake:  Pre-visit preparation completed: Yes  Pain : No/denies pain     BMI - recorded: 36.09 Nutritional Status: BMI > 30  Obese Diabetes: No  How often do you need to have someone help you when you read instructions, pamphlets, or other written materials from your doctor or pharmacy?: 1 - Never What is the last grade level you  completed in school?: 12th grade  Interpreter Needed?: No     Past Medical History:  Diagnosis Date  . ALLERGIC RHINITIS   . Anxiety   . Asthma   . Blood type O+   . Cervical strain, acute   . [place]   . Colon polyp 2010   adenoma  . Diverticulosis of colon   . Dizziness   . [place]ophageal varices ([place]   . Fibromyalgia   . GERD (gastroesophageal reflux disease)   . History of nephrolithiasis   . Hypercholesterolemia    borderline  . Hypertension   . IBS (irritable bowel syndrome)   . Osteoarthritis   . Personal history of allergy to unspecified medicinal agent   . Postconcussion syndrome   . Vitamin D deficiency    Past Surgical History:  Procedure Laterality Date  . CHOLECYSTECTOMY, LAPAROSCOPIC  2004   for gallstone pancreatitis  . KNEE SURGERY Right   . KNEE SURGERY Left   . THYROID SURGERY     Family History  Problem Relation Age of Onset  . Breast cancer Mother   . Alzheimer's disease Mother   . Heart disease Mother   . COPD Brother   . Heart disease Sister   . Breast cancer Sister 551 . Alcohol abuse Sister   . Ovarian cancer Paternal Grandmother   . Arthritis Other        Cousins   . COPD Cousin  Maternal side   . Heart attack Maternal Uncle    Social History   Socioeconomic History  . Marital status: Divorced    Spouse name: Not on file  . Number of children: 1  . Years of education: Not on file  . Highest education level: Not on file  Occupational History  . Occupation: retired    Fish farm manager: [employer]    Comment: Presenter, broadcasting  Social Needs  . Financial resource strain: Not hard at all  . Food insecurity:    Worry: Never true    Inability: Never true  . Transportation needs:    Medical: No    Non-medical: No  Tobacco Use  . Smoking status: Former Smoker    Packs/day: 2.00    Years: 8.00    Pack years: 16.00    Types: Cigarettes    Last attempt to quit: 05/13/1962    Years since  quitting: 56.4  . Smokeless tobacco: Never Used  Substance and Sexual Activity  . Alcohol use: No  . Drug use: No  . Sexual activity: Not Currently    Birth control/protection: Post-menopausal  Lifestyle  . Physical activity:    Days per week: 0 days    Minutes per session: 0 min  . Stress: Rather much  Relationships  . Social connections:    Talks on phone: More than three times a week    Gets together: More than three times a week    Attends religious service: More than 4 times per year    Active member of club or organization: No    Attends meetings of clubs or organizations: Never    Relationship status: Divorced  Other Topics Concern  . Not on file  Social History Narrative   exercises 3x a week   drinks 1 cup caffeine every other day    Outpatient Encounter Medications as of 09/24/2018  Medication Sig  . acetaminophen (TYLENOL) 500 MG tablet Take 1,000 mg by mouth 2 (two) times a day.  Marland Kitchen ADVAIR HFA 115-21 MCG/ACT inhaler TAKE 2 PUFFS BY MOUTH TWICE A DAY  . amLODipine (NORVASC) 2.5 MG tablet Take 1 tablet (2.5 mg total) by mouth daily.  . benzonatate (TESSALON PERLES) 100 MG capsule Take 1 capsule (100 mg total) by mouth 3 (three) times daily as needed for cough.  . calcium carbonate (TUMS - DOSED IN MG ELEMENTAL CALCIUM) 500 MG chewable tablet Chew 1 tablet by mouth as needed for indigestion or heartburn.  . Calcium Carbonate-Vitamin D (CALCIUM 600+D) 600-400 MG-UNIT tablet Take 1 tablet by mouth 2 (two) times daily.  . Cetirizine HCl (ZYRTEC ALLERGY) 10 MG CAPS Take 10 mg by mouth daily.   . Cholecalciferol (VITAMIN D3) 1000 UNITS CAPS Take 1,000 Units by mouth daily.   Marland Kitchen esomeprazole (NEXIUM) 40 MG capsule Take 1 capsule (40 mg total) by mouth 2 (two) times daily before a meal.  . hydrochlorothiazide (HYDRODIURIL) 25 MG tablet Take 1 tablet (25 mg total) by mouth daily.  Marland Kitchen KLOR-CON M20 20 MEQ tablet TAKE 1 TABLET BY MOUTH EVERY DAY  . meclizine (ANTIVERT) 25 MG tablet  TAKE 1 TABLET 3 TIMES A DAY AS NEEDED FOR DIZZINESS/NAUSEA  . Multiple Vitamins-Minerals (CENTRUM SILVER PO) Take 1 tablet by mouth daily.   . Omega-3 Fatty Acids (FISH OIL MAXIMUM STRENGTH) 1200 MG CAPS Take 1,200 mg by mouth 2 (two) times daily.   Marland Kitchen PROAIR HFA 108 (90 Base) MCG/ACT inhaler INHALE 2 PUFFS EVERY 4 HOURS  AS NEEDED INTO THE LUNGS FOR WHEEZING OR SHORTNESS OF BREATH  . propranolol (INDERAL) 10 MG tablet Take 1 tablet (10 mg total) by mouth 2 (two) times daily.  Marland Kitchen Propylene Glycol (SYSTANE BALANCE) 0.6 % SOLN Place 1 drop into both ears 2 (two) times daily.  Marland Kitchen SALINE NASAL SPRAY NA Place 1 spray into both nostrils 2 (two) times daily.   Marland Kitchen Zoster Vaccine Adjuvanted [facility]) injection Inject 0.5 mLs into the muscle once for 1 dose.  . [DISCONTINUED] Zoster Vaccine Adjuvanted [facility]) injection Inject 0.5 mLs into the muscle once.  . [DISCONTINUED] hydrochlorothiazide (MICROZIDE) 12.5 MG capsule TAKE 2 CAPSULES (25 MG TOTAL) BY MOUTH DAILY.   No facility-administered encounter medications on file as of 09/24/2018.     Activities of Daily Living In your present state of health, do you have any difficulty performing the following activities: 09/24/2018  Hearing? Y  Comment mild  Vision? N  Difficulty concentrating or making decisions? N  Walking or climbing stairs? Y  Comment avoids stairs, hx of knee surgery  Dressing or bathing? N  Doing errands, shopping? N  Preparing Food and eating ? N  Using the Toilet? N  In the past six months, have you accidently leaked urine? Y  Comment uses pad  Do you have problems with loss of bowel control? N  Managing your Medications? N  Managing your Finances? N  Housekeeping or managing your Housekeeping? N  Some recent data might be hidden    Patient Care Team: Lauree Chandler, NP as PCP - General (Nurse Practitioner)    Assessment:   This is a routine wellness examination for Ryen.  Exercise Activities and Dietary  recommendations Current Exercise Habits: The patient does not participate in regular exercise at present, Exercise limited by: None identified  Goals    . LDL CALC < 130    . Weight (lb) < 140 lb (63.5 kg)     Starting 07/02/16, I will attempt to decrease my weight to reach my goal weight of 140 lbs.        Fall Risk Fall Risk  09/24/2018 09/14/2018 09/08/2018 08/13/2018 06/18/2018  Falls in the past year? 0 0 0 0 0  Number falls in past yr: 0 0 0 0 0  Injury with Fall? 0 0 0 0 0  Follow up - - - - -   Is the patient's home free of loose throw rugs in walkways, pet beds, electrical cords, etc?   yes      Grab bars in the bathroom? yes      Handrails on the stairs?  No stairs     Adequate lighting?   yes  Timed Get Up and Go performed: na  Depression Screen PHQ 2/9 Scores 09/24/2018 06/18/2018 11/27/2017 08/13/2017  PHQ - 2 Score 0 0 0 0     Cognitive Function MMSE - Mini Mental State Exam 07/16/2017 07/02/2016 04/04/2015 09/22/2013  Not completed: - - (No Data) -  Orientation to time 5 5 5 5   Orientation to Place 5 4 5 5   Registration 3 3 3 3   Attention/ Calculation 5 5 5 5   Recall 2 3 2 2   Language- name 2 objects 2 2 2 2   Language- repeat 1 1 1 1   Language- follow 3 step command 3 3 3 3   Language- read & follow direction 1 1 1 1   Write a sentence 1 1 1 1   Copy design 1 1 1 1   Total score 29  29 29 29      6CIT Screen 09/24/2018  What time? 0 points  Count back from 20 0 points  Months in reverse 2 points  Repeat phrase 0 points    Immunization History  Administered Date(s) Administered  . Hepatitis A, Adult 10/15/2017, 04/17/2018  . Influenza Split 02/11/2011, 02/25/2012  . Influenza Whole 02/10/2009, 02/26/2010  . Influenza, High Dose Seasonal PF 04/24/2017, 01/26/2018, 01/26/2018  . Influenza,inj,Quad PF,6+ Mos 02/09/2014, 03/20/2015, 02/27/2016  . Influenza-Unspecified 03/13/2013, 03/13/2017  . Pneumococcal Conjugate-13 02/27/2016  . Pneumococcal Polysaccharide-23  08/25/2014  . Tdap 12/06/2013    Qualifies for Shingles Vaccine? yes  Screening Tests Health Maintenance  Topic Date Due  . INFLUENZA VACCINE  12/12/2018  . COLONOSCOPY  06/25/2020  . TETANUS/TDAP  12/07/2023  . DEXA SCAN  Completed  . PNA vac Low Risk Adult  Completed    Cancer Screenings: Lung: Low Dose CT Chest recommended if Age 2-80 years, 30 pack-year currently smoking OR have quit w/in 15years. Patient does not qualify. Breast:  Up to date on Mammogram? Yes   Up to date of Bone Density/Dexa? Yes Colorectal: aged out.  Additional Screenings:  Hepatitis C Screening: done     Plan:      I have personally reviewed and noted the following in the patient's chart:   . Medical and social history . Use of alcohol, tobacco or illicit drugs  . Current medications and supplements . Functional ability and status . Nutritional status . Physical activity . Advanced directives . List of other physicians . Hospitalizations, surgeries, and ER visits in previous 12 months . Vitals . Screenings to include cognitive, depression, and falls . Referrals and appointments  In addition, I have reviewed and discussed with patient certain preventive protocols, quality metrics, and best practice recommendations. A written personalized care plan for preventive services as well as general preventive health recommendations were provided to patient.     Lauree Chandler, NP  09/24/2018

## 2018-10-07 ENCOUNTER — Other Ambulatory Visit: Payer: Self-pay | Admitting: Nurse Practitioner

## 2018-10-07 DIAGNOSIS — I10 Essential (primary) hypertension: Secondary | ICD-10-CM

## 2018-10-07 NOTE — Telephone Encounter (Signed)
This encounter was created in error - please disregard.

## 2018-10-15 ENCOUNTER — Encounter: Payer: Self-pay | Admitting: Nurse Practitioner

## 2018-10-15 ENCOUNTER — Ambulatory Visit (INDEPENDENT_AMBULATORY_CARE_PROVIDER_SITE_OTHER): Payer: PPO | Admitting: Nurse Practitioner

## 2018-10-15 ENCOUNTER — Other Ambulatory Visit: Payer: Self-pay

## 2018-10-15 DIAGNOSIS — M1990 Unspecified osteoarthritis, unspecified site: Secondary | ICD-10-CM | POA: Diagnosis not present

## 2018-10-15 DIAGNOSIS — I10 Essential (primary) hypertension: Secondary | ICD-10-CM

## 2018-10-15 DIAGNOSIS — K7460 Unspecified cirrhosis of liver: Secondary | ICD-10-CM

## 2018-10-15 DIAGNOSIS — K219 Gastro-esophageal reflux disease without esophagitis: Secondary | ICD-10-CM

## 2018-10-15 DIAGNOSIS — E785 Hyperlipidemia, unspecified: Secondary | ICD-10-CM | POA: Diagnosis not present

## 2018-10-15 DIAGNOSIS — J4520 Mild intermittent asthma, uncomplicated: Secondary | ICD-10-CM

## 2018-10-15 DIAGNOSIS — R739 Hyperglycemia, unspecified: Secondary | ICD-10-CM

## 2018-10-15 DIAGNOSIS — J309 Allergic rhinitis, unspecified: Secondary | ICD-10-CM | POA: Diagnosis not present

## 2018-10-15 DIAGNOSIS — E669 Obesity, unspecified: Secondary | ICD-10-CM | POA: Diagnosis not present

## 2018-10-15 DIAGNOSIS — R040 Epistaxis: Secondary | ICD-10-CM

## 2018-10-15 MED ORDER — AMLODIPINE BESYLATE 5 MG PO TABS: 5.0000 mg | ORAL_TABLET | Freq: Every day | ORAL | 2 refills | Status: DC

## 2018-10-15 NOTE — Progress Notes (Signed)
This service is provided via telemedicine  No vital signs collected/recorded due to the encounter was a telemedicine visit.   Location of patient (ex: home, work):  Home   Patient consents to a telephone visit:  Yes  Location of the provider (ex: office, home): [facility], Office   Name of any referring provider:  N/A  Names of all persons participating in the telemedicine service and their role in the encounter:  S.Chrae B/CMA, Sherrie Mustache, NP, and Patient   Time spent on call: 8 min with medical assistant     Careteam: Patient Care Team: Lauree Chandler, NP as PCP - General (Nurse Practitioner)  Advanced Directive information Does Patient Have a Medical Advance Directive?: Yes, Type of Advance Directive: Out of facility DNR (pink MOST or yellow form), Pre-existing out of facility DNR order (yellow form or pink MOST form): Yellow form placed in chart (order not valid for inpatient use), Does patient want to make changes to medical advance directive?: No - Patient declined  Allergies  Allergen Reactions  . Ace Inhibitors   . Amoxicillin Itching    REACTION: unknown? pain in right kidney  . Benicar Hct [Olmesartan Medoxomil-Hctz]     Extreme weakness  . Budesonide-Formoterol Fumarate     Causes tremors and numbness  . Chlorzoxazone [Chlorzoxazone]   . Citalopram Hydrobromide     REACTION: hives  . Citalopram Hydrobromide   . Clonidine Derivatives   . Droperidol     REACTION: hives  . Flexeril [Cyclobenzaprine Hcl]   . Fluconazole Nausea And Vomiting and Other (See Comments)    fatigue  . Ketorolac Tromethamine     REACTION: hives  . Ketorolac Tromethamine   . Metoclopramide Hcl   . Mometasone Furo-Formoterol Fum     Causes sore throat, blurred vision and unable to sleep--started on med 09-17-10  . Morphine     REACTION: hives and itching  . Olmesartan Medoxomil     REACTION: fatique  . Breo Ellipta [Fluticasone Furoate-Vilanterol] Itching   Pt reports itching with Memory [place] is related to being lactose intolerant.     Chief Complaint  Patient presents with  . Medical Management of Chronic Issues    4 month follow-up. Patient c/o nose bleeds related to dryness. Patient also with headaches off /on. Telephone visit   . Immunizations    Discuss need for shingrix   . Medication Management    Discuss usage of saline nasal spray      HPI: Patient is a 78 y.o. female for routine follow up.  No recurrent nose bleed since she has stopped Flonase.  Still using humidifier and saline nose spray.   Hypertension- now back down to normal currently taking norvasc 2.5 mg by mouth daily, propranolol twice daily  with hctz 25 mg daily  Blood pressure ranging 140-150/70s-80s  Asthma/allergy- occasional wheezing, continues on advair twice daily and zyrtec. Uses albuterol PRN, only needed once this week.   GERD- continues on nexium, continues to have reflux, will use tums PRN  Hyperlipidemia- working on diet to help get LDL at goal. Has chosen not to be on any medication for this.   Not doing any exercises- afraid of going out due to [place].    Review of Systems:  Review of Systems  Constitutional: Negative for chills, fever and malaise/fatigue.  HENT: Positive for hearing loss. Negative for congestion, ear pain and sore throat.        Nose bleeds have stopped   Respiratory:  Positive for cough (ongoing) and sputum production (clear). Negative for shortness of breath and wheezing.   Cardiovascular: Negative for chest pain, palpitations and leg swelling.  Gastrointestinal: Positive for heartburn (tums helps with nexium). Negative for abdominal pain, constipation, diarrhea, nausea and vomiting.  Genitourinary: Negative for dysuria, flank pain, frequency, hematuria and urgency.  Musculoskeletal: Negative for back pain, falls and joint pain.       Tylenol has helped joint pain  Skin: Negative.   Neurological: Negative for dizziness and  headaches.  Psychiatric/Behavioral: Negative for depression and memory loss. The patient is nervous/anxious. The patient does not have insomnia.        "Faith has kept me straight"    Past Medical History:  Diagnosis Date  . ALLERGIC RHINITIS   . Anxiety   . Asthma   . Blood type O+   . Cervical strain, acute   . Cirrhosis ([place])   . Colon polyp 2010   adenoma  . Diverticulosis of colon   . Dizziness   . Esophageal varices ([place])   . Fibromyalgia   . GERD (gastroesophageal reflux disease)   . History of nephrolithiasis   . Hypercholesterolemia    borderline  . Hypertension   . IBS (irritable bowel syndrome)   . Osteoarthritis   . Personal history of allergy to unspecified medicinal agent   . Postconcussion syndrome   . Vitamin D deficiency    Past Surgical History:  Procedure Laterality Date  . CHOLECYSTECTOMY, LAPAROSCOPIC  2004   for gallstone pancreatitis  . KNEE SURGERY Right   . KNEE SURGERY Left   . THYROID SURGERY     Social History:   reports that she quit smoking about 56 years ago. Her smoking use included cigarettes. She has a 16.00 pack-year smoking history. She has never used smokeless tobacco. She reports that she does not drink alcohol or use drugs.  Family History  Problem Relation Age of Onset  . Breast cancer Mother   . Alzheimer's disease Mother   . Heart disease Mother   . COPD Brother   . Heart disease Sister   . Breast cancer Sister 75  . Alcohol abuse Sister   . Ovarian cancer Paternal Grandmother   . Arthritis Other        Cousins   . COPD Cousin        Maternal side   . Heart attack Maternal Uncle     Medications: Patient's Medications  New Prescriptions   No medications on file  Previous Medications   ACETAMINOPHEN (TYLENOL) 500 MG TABLET    Take 1,000 mg by mouth 2 (two) times a day.   ADVAIR HFA 115-21 MCG/ACT INHALER    TAKE 2 PUFFS BY MOUTH TWICE A DAY   AMLODIPINE (NORVASC) 2.5 MG TABLET    TAKE 1 TABLET BY MOUTH EVERY DAY    BENZONATATE (TESSALON PERLES) 100 MG CAPSULE    Take 1 capsule (100 mg total) by mouth 3 (three) times daily as needed for cough.   CALCIUM CARBONATE (TUMS - DOSED IN MG ELEMENTAL CALCIUM) 500 MG CHEWABLE TABLET    Chew 1 tablet by mouth as needed for indigestion or heartburn.   CALCIUM CARBONATE-VITAMIN D (CALCIUM 600+D) 600-400 MG-UNIT TABLET    Take 1 tablet by mouth 2 (two) times daily.   CETIRIZINE HCL (ZYRTEC ALLERGY) 10 MG CAPS    Take 10 mg by mouth daily.    CHOLECALCIFEROL (VITAMIN D3) 1000 UNITS CAPS    Take 1,000 Units  by mouth daily.    ESOMEPRAZOLE (NEXIUM) 40 MG CAPSULE    Take 1 capsule (40 mg total) by mouth 2 (two) times daily before a meal.   HYDROCHLOROTHIAZIDE (HYDRODIURIL) 25 MG TABLET    Take 1 tablet (25 mg total) by mouth daily.   KLOR-CON M20 20 MEQ TABLET    TAKE 1 TABLET BY MOUTH EVERY DAY   MECLIZINE (ANTIVERT) 25 MG TABLET    TAKE 1 TABLET 3 TIMES A DAY AS NEEDED FOR DIZZINESS/NAUSEA   MULTIPLE VITAMINS-MINERALS (CENTRUM SILVER PO)    Take 1 tablet by mouth daily.    OMEGA-3 FATTY ACIDS (FISH OIL MAXIMUM STRENGTH) 1200 MG CAPS    Take 1,200 mg by mouth 2 (two) times daily.    PROAIR HFA 108 (90 BASE) MCG/ACT INHALER    INHALE 2 PUFFS EVERY 4 HOURS AS NEEDED INTO THE LUNGS FOR WHEEZING OR SHORTNESS OF BREATH   PROPRANOLOL (INDERAL) 10 MG TABLET    Take 1 tablet (10 mg total) by mouth 2 (two) times daily.   PROPYLENE GLYCOL (SYSTANE BALANCE) 0.6 % SOLN    Place 1 drop into both ears 2 (two) times daily.   SALINE NASAL SPRAY NA    Place 1 spray into both nostrils 2 (two) times daily.   Modified Medications   No medications on file  Discontinued Medications   No medications on file    Physical Exam:  There were no vitals filed for this visit. There is no height or weight on file to calculate BMI. Wt Readings from Last 3 Encounters:  09/04/18 191 lb (86.6 kg)  09/02/18 191 lb (86.6 kg)  06/18/18 196 lb (88.9 kg)    Labs reviewed: Basic Metabolic Panel:  Recent Labs    06/18/18 0947 06/25/18 0943 09/02/18 0218  NA 141 143 137  K 4.4 4.4 3.9  CL 103 103 101  CO2 30 32 23  GLUCOSE 107* 110* 143*  BUN 20 20 17   CREATININE 0.79 0.75 0.80  CALCIUM 9.9 9.9 9.6   Liver Function Tests: Recent Labs    12/18/17 1525  06/18/18 0947 06/25/18 0943 09/02/18 0218  AST 29   < > 76* 69* 56*  ALT 33   < > 96* 91* 66*  ALKPHOS 102  --   --  89 123  BILITOT 0.5   < > 0.6 0.8 0.8  PROT 6.7   < > 6.8 6.8 6.3*  ALBUMIN 4.0  --   --  4.4 3.6   < > = values in this interval not displayed.   No results for input(s): LIPASE, AMYLASE in the last 8760 hours. No results for input(s): AMMONIA in the last 8760 hours. CBC: Recent Labs    03/30/18 1106 06/18/18 0947 09/02/18 0218  WBC 8.1 8.7 9.2  NEUTROABS 3,540 4,411 3.7  HGB 15.1 15.3 14.7  HCT 44.4 45.5* 44.1  MCV 83.8 86.5 87.5  PLT 293 311 268   Lipid Panel: Recent Labs    03/30/18 1106 06/18/18 0947  CHOL 193 201*  HDL 40* 46*  LDLCALC 117* 118*  TRIG 238* 261*  CHOLHDL 4.8 4.4   TSH: No results for input(s): TSH in the last 8760 hours. A1C: Lab Results  Component Value Date   HGBA1C 6.2 (H) 03/30/2018     Assessment/Plan 1. Essential hypertension Continues to be uncontrolled. Discussed dietary modifications as well as increasing activity.  -to continue propranolol twice daily, hctz daily and will increase amlodipine to 5 mg daily -  amLODipine (NORVASC) 5 MG tablet; Take 1 tablet (5 mg total) by mouth daily.  Dispense: 30 tablet; Refill: 2 -goal bp<140/90, to notify if continues to be elevated  2. Asthma, allergic, mild intermittent, uncomplicated Stable, without recent flare, continues on advair twice daily, cetirizine and albuterol PRN  3. Obesity (BMI 30.0-34.9) Encouraged weight loss with diet and increase in activity.   4. Allergic rhinitis, unspecified seasonality, unspecified trigger Stable, without worsening of symptoms  5. Dyslipidemia LDL elevated on  last labs but pt does not wish to start medication. Has attempted dietary modification but has not made significant changes   6. Gastroesophageal reflux disease without esophagitis Ongoing, encouraged dietary modifications, continues on nexium with tums PRN  7. Epistaxis Continues with saline nose spray PRN and using humidifier, since heat is not on questions if this is okay to stop. She can use PRN but if nose bleeds reoccur to start back.   8. Osteoarthritis, unspecified osteoarthritis type, unspecified site Stable, has improved now that she is taking tylenol. Is limiting tylenol to less than 2000 mg daily  9. Hepatic cirrhosis, unspecified hepatic cirrhosis type, unspecified whether ascites present (HCC) Stable, ongoing follow up with GI. Continues on propanolol   10. Hyperglycemia Encouraged dietary changes. Will follow up A1c at next OV.   Next appt: 4 months  Tristen Luce K. Harle Battiest  [facility] [phone]    Virtual Visit via Telephone Note  I connected with pt on 10/15/18 at  2:15 PM EDT by telephone and verified that I am speaking with the correct person using two identifiers.  Location: Patient: home Provider: office   I discussed the limitations, risks, security and privacy concerns of performing an evaluation and management service by telephone and the availability of in person appointments. I also discussed with the patient that there may be a patient responsible charge related to this service. The patient expressed understanding and agreed to proceed.   I discussed the assessment and treatment plan with the patient. The patient was provided an opportunity to ask questions and all were answered. The patient agreed with the plan and demonstrated an understanding of the instructions.   The patient was advised to call back or seek an in-person evaluation if the symptoms worsen or if the condition fails to improve as anticipated.  I  provided 26 minutes of non-face-to-face time during this encounter.  Carlos American. Harle Battiest Avs printed and mailed

## 2018-10-15 NOTE — Patient Instructions (Addendum)
Increase norvasc to 5 mg by mouth daily to help control blood pressure GOAL BLOOD PRESSURE IS LESS THAN 140/90 Call us if it continues to be higher than that  Increase activity as tolerates Walking daily will help Make changes in diet.  DASH Eating Plan DASH stands for "Dietary Approaches to Stop Hypertension." The DASH eating plan is a healthy eating plan that has been shown to reduce high blood pressure (hypertension). It may also reduce your risk for type 2 diabetes, heart disease, and stroke. The DASH eating plan may also help with weight loss. What are tips for following this plan?  General guidelines  Avoid eating more than 2,300 mg (milligrams) of salt (sodium) a day. If you have hypertension, you may need to reduce your sodium intake to 1,500 mg a day.  Limit alcohol intake to no more than 1 drink a day for nonpregnant women and 2 drinks a day for men. One drink equals 12 oz of beer, 5 oz of wine, or 1 oz of hard liquor.  Work with your health care provider to maintain a healthy body weight or to lose weight. Ask what an ideal weight is for you.  Get at least 30 minutes of exercise that causes your heart to beat faster (aerobic exercise) most days of the week. Activities may include walking, swimming, or biking.  Work with your health care provider or diet and nutrition specialist (dietitian) to adjust your eating plan to your individual calorie needs. Reading food labels   Check food labels for the amount of sodium per serving. Choose foods with less than 5 percent of the Daily Value of sodium. Generally, foods with less than 300 mg of sodium per serving fit into this eating plan.  To find whole grains, look for the word "whole" as the first word in the ingredient list. Shopping  Buy products labeled as "low-sodium" or "no salt added."  Buy fresh foods. Avoid canned foods and premade or frozen meals. Cooking  Avoid adding salt when cooking. Use salt-free seasonings or  herbs instead of table salt or sea salt. Check with your health care provider or pharmacist before using salt substitutes.  Do not fry foods. Cook foods using healthy methods such as baking, boiling, grilling, and broiling instead.  Cook with heart-healthy oils, such as olive, canola, soybean, or sunflower oil. Meal planning  Eat a balanced diet that includes: ? 5 or more servings of fruits and vegetables each day. At each meal, try to fill half of your plate with fruits and vegetables. ? Up to 6-8 servings of whole grains each day. ? Less than 6 oz of lean meat, poultry, or fish each day. A 3-oz serving of meat is about the same size as a deck of cards. One egg equals 1 oz. ? 2 servings of low-fat dairy each day. ? A serving of nuts, seeds, or beans 5 times each week. ? Heart-healthy fats. Healthy fats called Omega-3 fatty acids are found in foods such as flaxseeds and coldwater fish, like sardines, salmon, and mackerel.  Limit how much you eat of the following: ? Canned or prepackaged foods. ? Food that is high in trans fat, such as fried foods. ? Food that is high in saturated fat, such as fatty meat. ? Sweets, desserts, sugary drinks, and other foods with added sugar. ? Full-fat dairy products.  Do not salt foods before eating.  Try to eat at least 2 vegetarian meals each week.  Eat more home-cooked food and  less restaurant, buffet, and fast food.  When eating at a restaurant, ask that your food be prepared with less salt or no salt, if possible. What foods are recommended? The items listed may not be a complete list. Talk with your dietitian about what dietary choices are best for you. Grains Whole-grain or whole-wheat bread. Whole-grain or whole-wheat pasta. Brown rice. [place]. Bulgur. Whole-grain and low-sodium cereals. Pita bread. Low-fat, low-sodium crackers. Whole-wheat flour tortillas. Vegetables Fresh or frozen vegetables (raw, steamed, roasted, or grilled).  Low-sodium or reduced-sodium tomato and vegetable juice. Low-sodium or reduced-sodium tomato sauce and tomato paste. Low-sodium or reduced-sodium canned vegetables. Fruits All fresh, dried, or frozen fruit. Canned fruit in natural juice (without added sugar). Meat and other protein foods Skinless chicken or [place]. Ground chicken or [place]. Pork with fat trimmed off. Fish and seafood. Egg whites. Dried beans, peas, or lentils. Unsalted nuts, nut butters, and seeds. Unsalted canned beans. Lean cuts of beef with fat trimmed off. Low-sodium, lean deli meat. Dairy Low-fat (1%) or fat-free (skim) milk. Fat-free, low-fat, or reduced-fat cheeses. Nonfat, low-sodium ricotta or cottage cheese. Low-fat or nonfat yogurt. Low-fat, low-sodium cheese. Fats and oils Soft margarine without trans fats. Vegetable oil. Low-fat, reduced-fat, or light mayonnaise and salad dressings (reduced-sodium). Canola, safflower, olive, soybean, and sunflower oils. Avocado. Seasoning and other foods Herbs. Spices. Seasoning mixes without salt. Unsalted popcorn and pretzels. Fat-free sweets. What foods are not recommended? The items listed may not be a complete list. Talk with your dietitian about what dietary choices are best for you. Grains Baked goods made with fat, such as croissants, muffins, or some breads. Dry pasta or rice meal packs. Vegetables Creamed or fried vegetables. Vegetables in a cheese sauce. Regular canned vegetables (not low-sodium or reduced-sodium). Regular canned tomato sauce and paste (not low-sodium or reduced-sodium). Regular tomato and vegetable juice (not low-sodium or reduced-sodium). Angie Fava. Olives. Fruits Canned fruit in a light or heavy syrup. Fried fruit. Fruit in cream or butter sauce. Meat and other protein foods Fatty cuts of meat. Ribs. Fried meat. Berniece Salines. Sausage. Bologna and other processed lunch meats. Salami. Fatback. Hotdogs. Bratwurst. Salted nuts and seeds. Canned beans with added  salt. Canned or smoked fish. Whole eggs or egg yolks. Chicken or [place] with skin. Dairy Whole or 2% milk, cream, and half-and-half. Whole or full-fat cream cheese. Whole-fat or sweetened yogurt. Full-fat cheese. Nondairy creamers. Whipped toppings. Processed cheese and cheese spreads. Fats and oils Butter. Stick margarine. Lard. Shortening. Ghee. Bacon fat. Tropical oils, such as coconut, palm kernel, or palm oil. Seasoning and other foods Salted popcorn and pretzels. Onion salt, garlic salt, seasoned salt, table salt, and sea salt. Worcestershire sauce. Tartar sauce. Barbecue sauce. Teriyaki sauce. Soy sauce, including reduced-sodium. Steak sauce. Canned and packaged gravies. Fish sauce. Oyster sauce. Cocktail sauce. Horseradish that you find on the shelf. Ketchup. Mustard. Meat flavorings and tenderizers. Bouillon cubes. Hot sauce and Tabasco sauce. Premade or packaged marinades. Premade or packaged taco seasonings. Relishes. Regular salad dressings. Where to find more information:  National Heart, Lung, and [place]: https://wilson-eaton.com/  American Heart Association: www.heart.org Summary  The DASH eating plan is a healthy eating plan that has been shown to reduce high blood pressure (hypertension). It may also reduce your risk for type 2 diabetes, heart disease, and stroke.  With the DASH eating plan, you should limit salt (sodium) intake to 2,300 mg a day. If you have hypertension, you may need to reduce your sodium intake to 1,500 mg a day.  When on the DASH eating plan, aim to eat more fresh fruits and vegetables, whole grains, lean proteins, low-fat dairy, and heart-healthy fats.  Work with your health care provider or diet and nutrition specialist (dietitian) to adjust your eating plan to your individual calorie needs. This information is not intended to replace advice given to you by your health care provider. Make sure you discuss any questions you have with your health care  provider. Document Released: 04/18/2011 Document Revised: 04/22/2016 Document Reviewed: 04/22/2016 Elsevier Interactive Patient Education  2019 [employer].

## 2018-10-30 ENCOUNTER — Other Ambulatory Visit: Payer: Self-pay | Admitting: Nurse Practitioner

## 2018-10-30 ENCOUNTER — Other Ambulatory Visit: Payer: Self-pay | Admitting: Adult Health

## 2018-10-30 DIAGNOSIS — J4520 Mild intermittent asthma, uncomplicated: Secondary | ICD-10-CM

## 2018-11-11 ENCOUNTER — Other Ambulatory Visit: Payer: Self-pay | Admitting: Nurse Practitioner

## 2018-11-15 ENCOUNTER — Other Ambulatory Visit: Payer: Self-pay | Admitting: Nurse Practitioner

## 2018-11-15 ENCOUNTER — Other Ambulatory Visit: Payer: Self-pay | Admitting: Gastroenterology

## 2018-11-16 NOTE — Telephone Encounter (Signed)
High risk warning populated when attempting to refill medication  Please review and approval if appropriate   Thanks,  S.Chrae B/CMA

## 2018-11-23 ENCOUNTER — Other Ambulatory Visit: Payer: Self-pay | Admitting: Nurse Practitioner

## 2018-11-23 DIAGNOSIS — I10 Essential (primary) hypertension: Secondary | ICD-10-CM

## 2018-12-10 ENCOUNTER — Other Ambulatory Visit: Payer: Self-pay | Admitting: Nurse Practitioner

## 2018-12-10 IMAGING — DX DG THORACIC SPINE 3V
3 series · 3 of 3 positions shown · non-contrast
Comparison: Chest x-ray dated 02/12/2010.

CLINICAL DATA: Pain in lower thoracic spine for 2-3 months.

EXAM:
THORACIC SPINE - 3 VIEWS

[dg thoracic spine w/swimmers (1 of 3)]
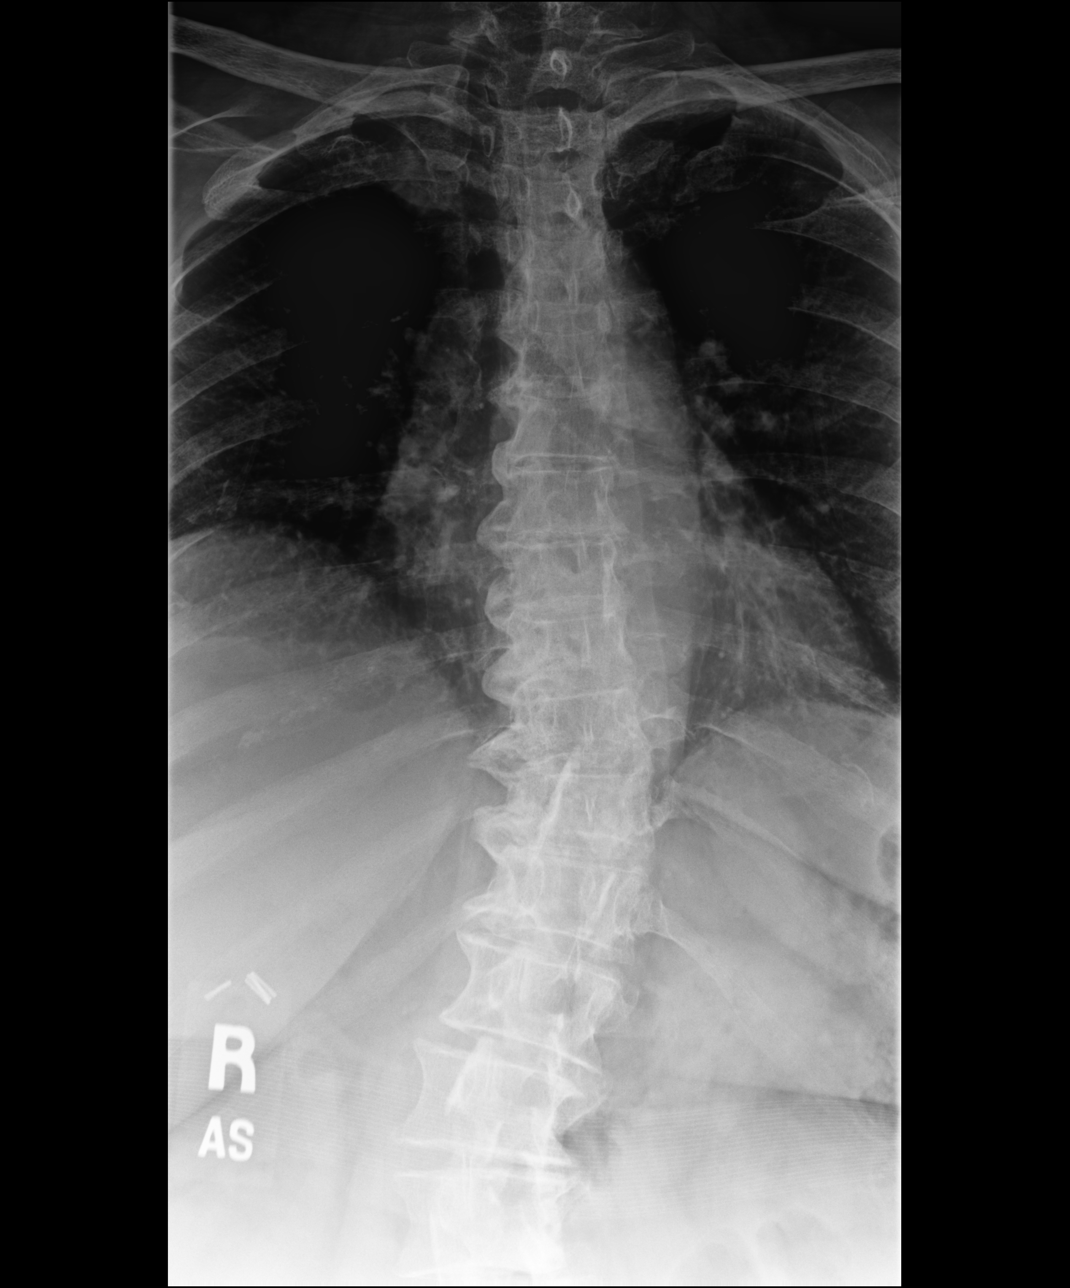

[dg thoracic spine w/swimmers (2 of 3)]
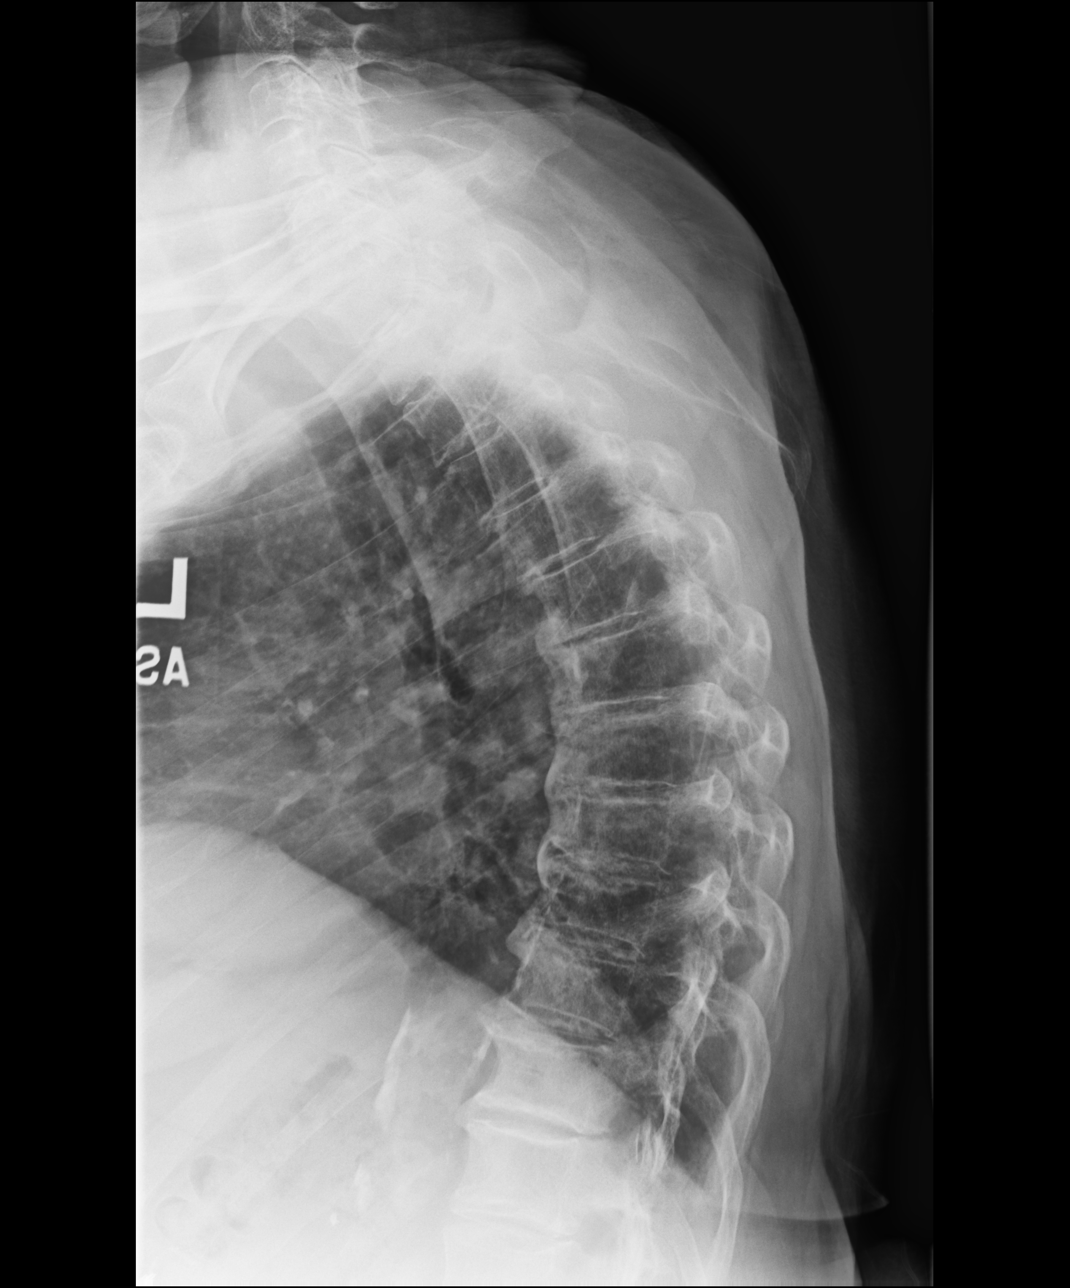

[dg thoracic spine w/swimmers (3 of 3)]
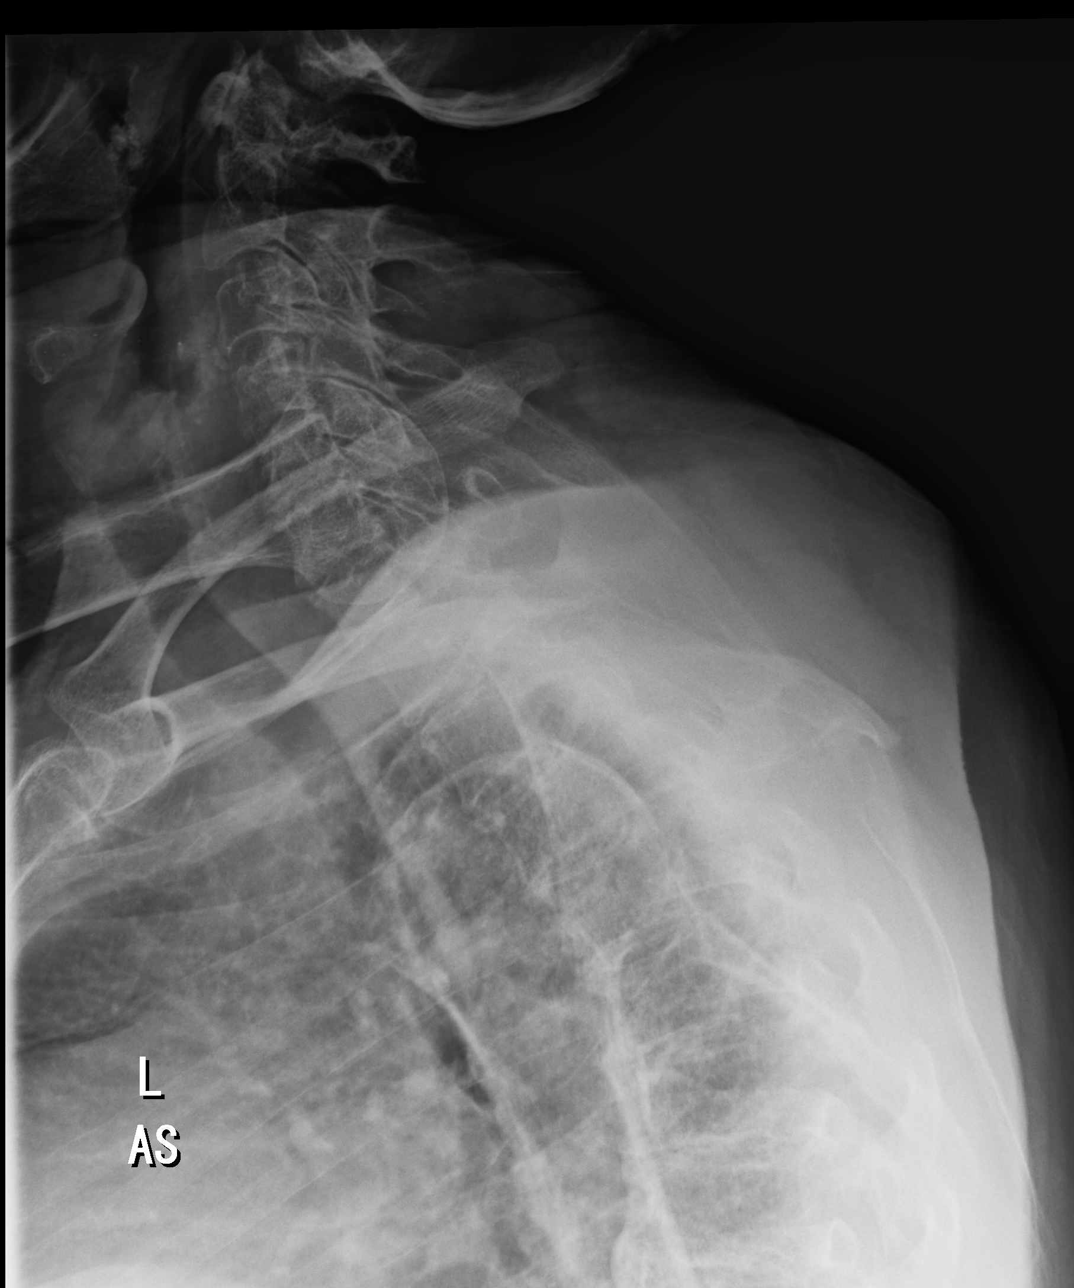

[3 of 3 positions shown; findings below may reference images not displayed]

FINDINGS: Alignment appears stable, with levoscoliosis. No acute or suspicious
osseous lesion. Chronic degenerative spondylosis again noted
throughout the kyphotic thoracic spine, mild to moderate in degree,
not significantly changed. Visualized paravertebral soft tissues are
unremarkable.
IMPRESSION: 1. No acute findings.
2. Stable scoliosis.
3. Chronic degenerative spondylosis of the thoracic spine, mild to
moderate in degree, not significantly changed compared to previous
chest x-ray of 02/12/2010.

## 2018-12-10 NOTE — Telephone Encounter (Signed)
RX request for Hydrochlorothiazide 12.5 mg received from pharmacy, patient is on 25 mg. RX sent for 25 mg in May 2020 for a year supply.

## 2018-12-11 IMAGING — CR DG CHEST 2V
2 series · 2 of 2 positions shown · non-contrast
Comparison: CT chest 07/11/2017

CLINICAL DATA: Increased shortness of breath

EXAM:
CHEST - 2 VIEW

[chest pa]
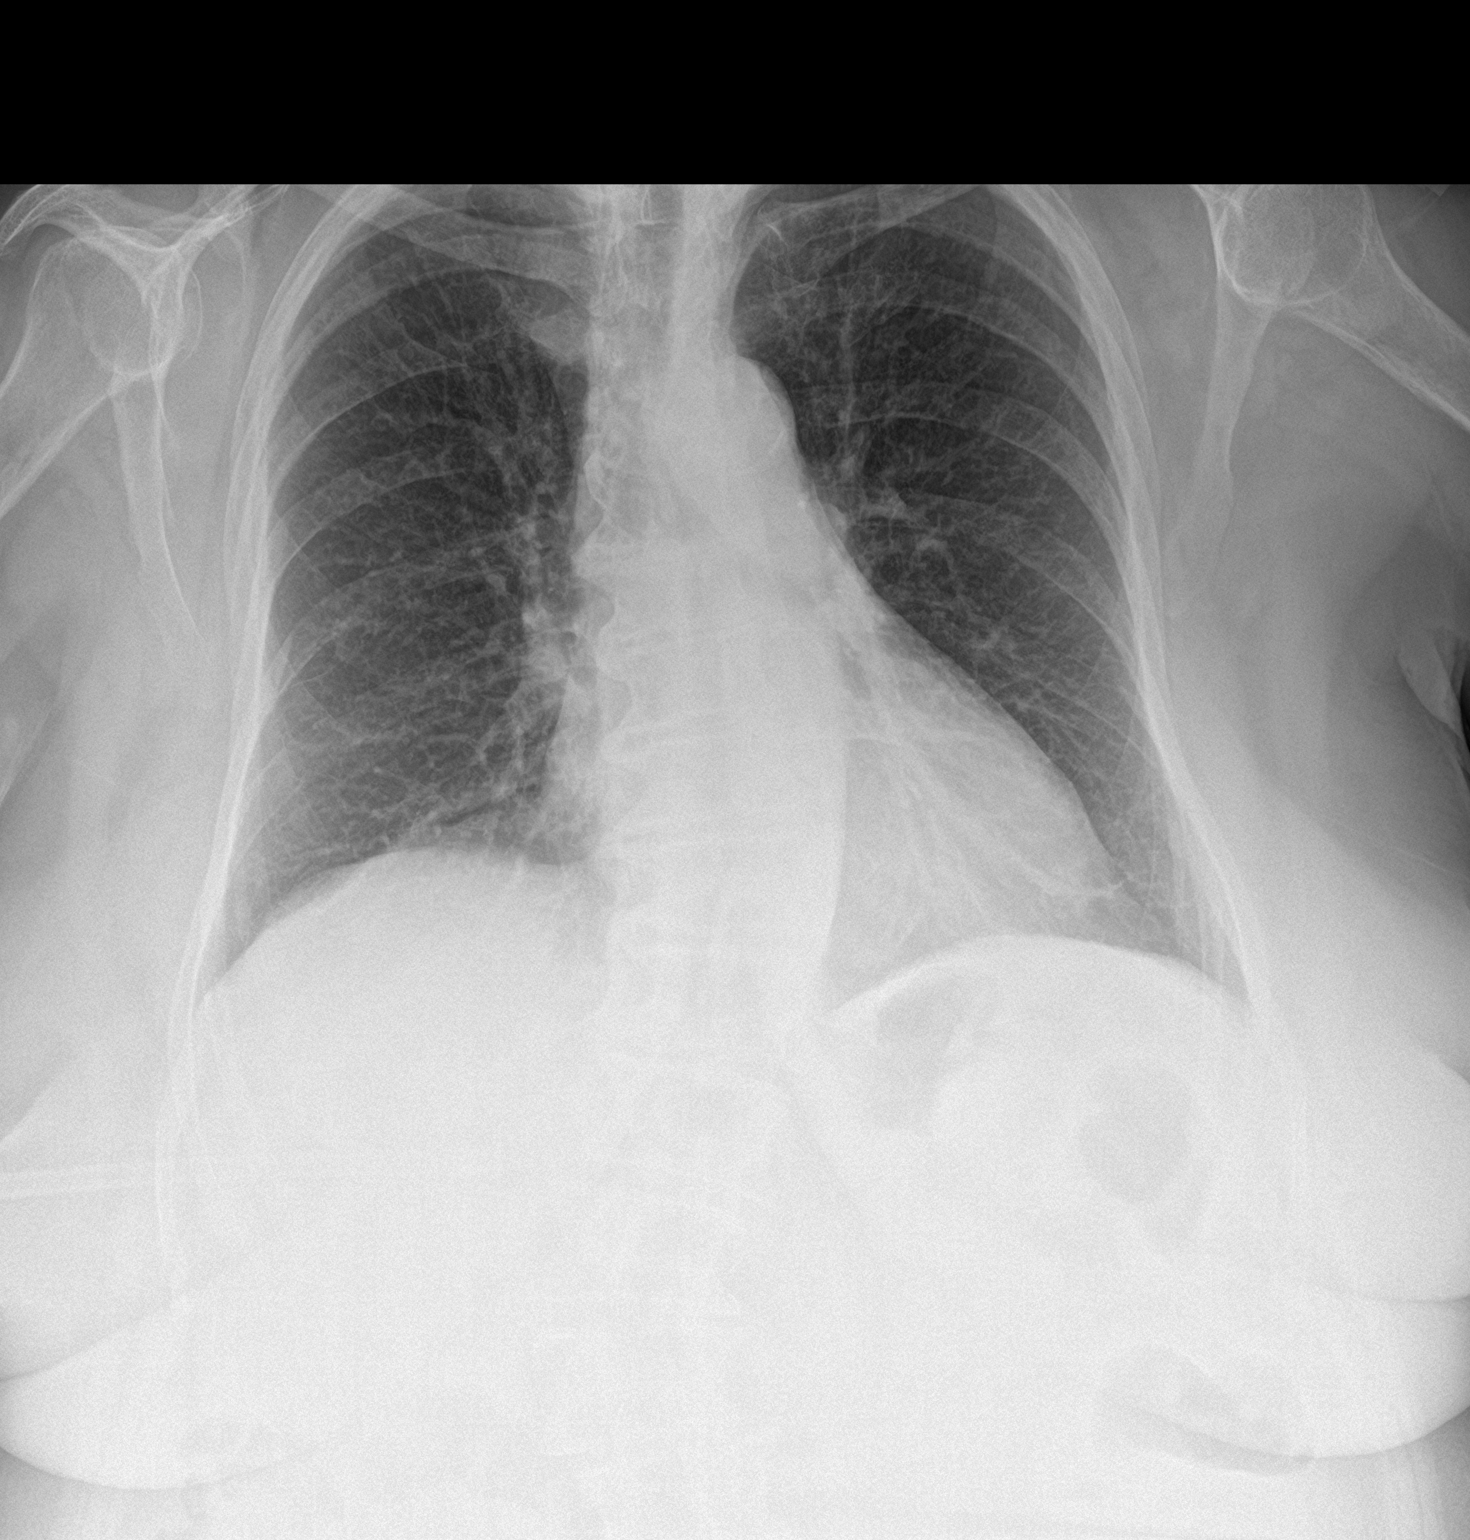

[chest lat]
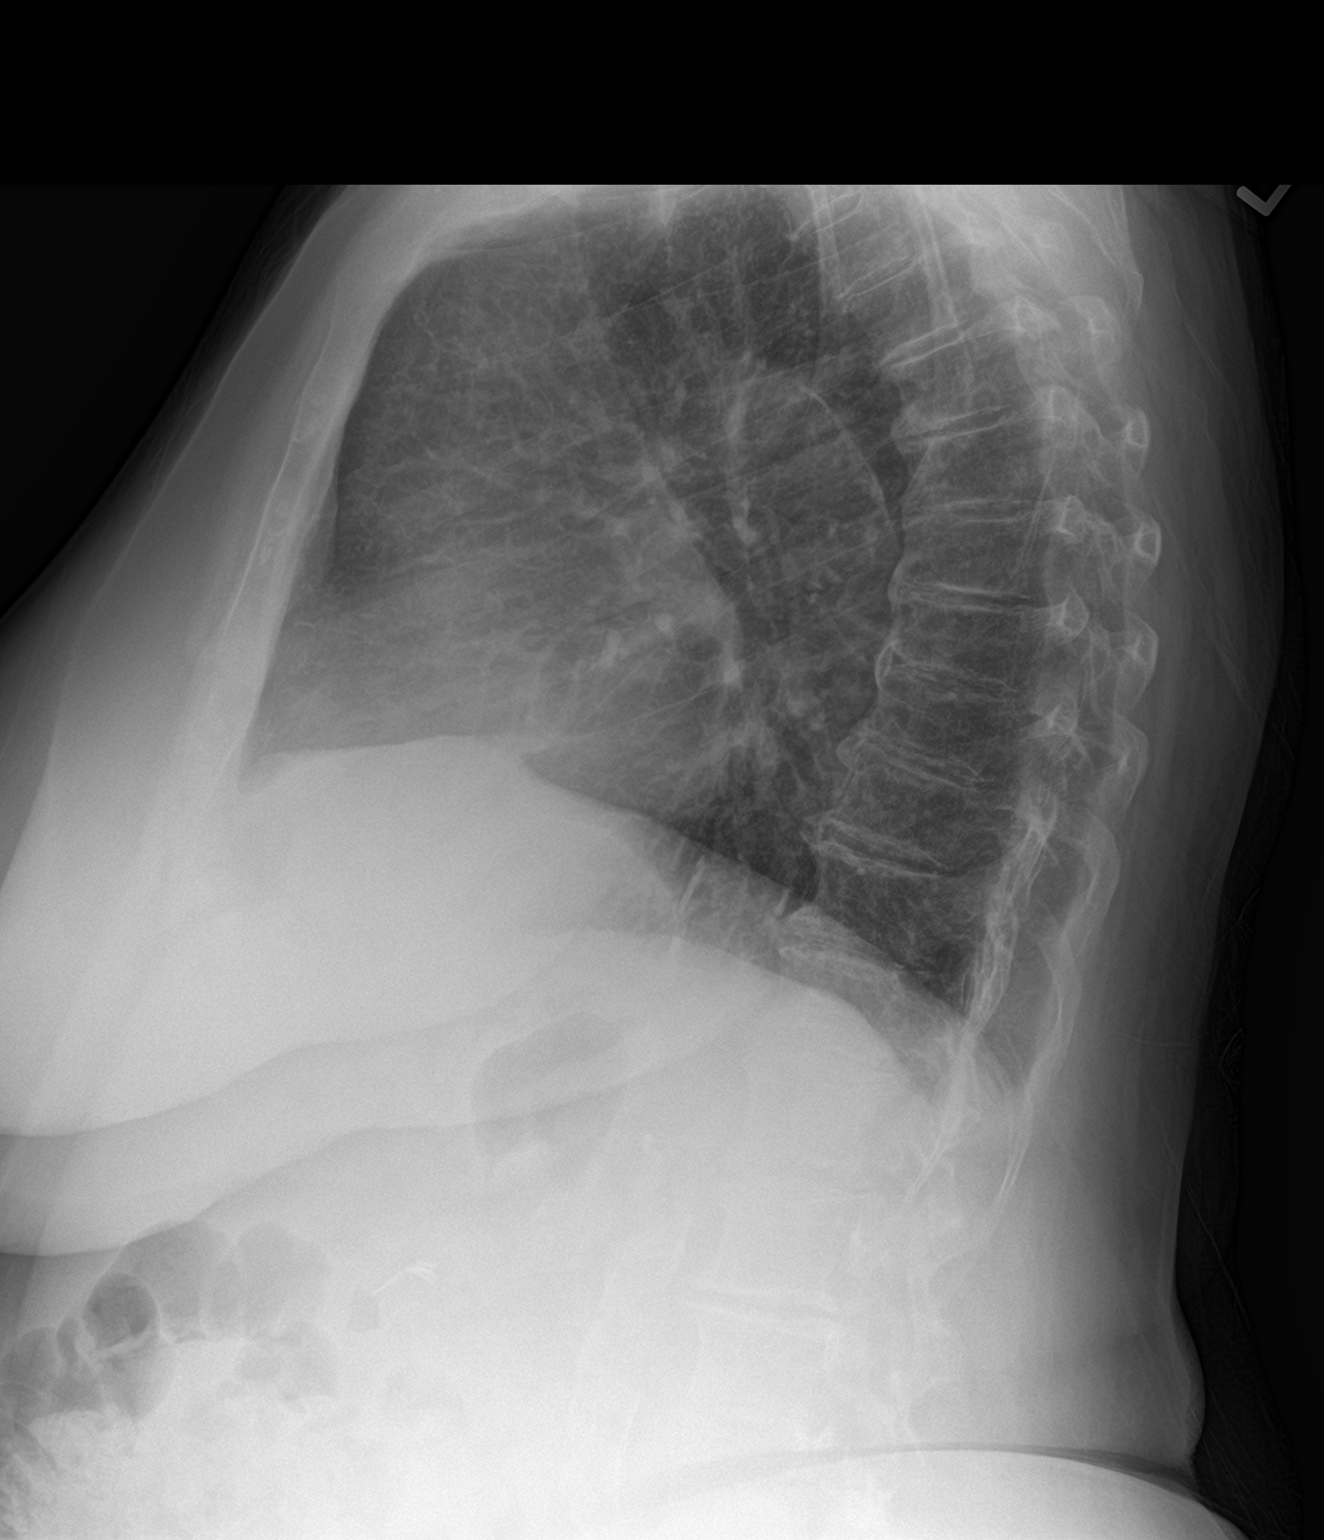

[2 of 2 positions shown; findings below may reference images not displayed]

FINDINGS: The heart size and mediastinal contours are within normal limits.
Both lungs are clear. Thoracic spine spondylosis. No acute osseous
abnormality.
IMPRESSION: No active cardiopulmonary disease.

## 2018-12-27 ENCOUNTER — Other Ambulatory Visit: Payer: Self-pay | Admitting: Nurse Practitioner

## 2019-01-04 ENCOUNTER — Other Ambulatory Visit: Payer: Self-pay | Admitting: Nurse Practitioner

## 2019-01-04 DIAGNOSIS — I10 Essential (primary) hypertension: Secondary | ICD-10-CM

## 2019-01-04 MED ORDER — HYDROCHLOROTHIAZIDE 25 MG PO TABS: 25.0000 mg | ORAL_TABLET | Freq: Every day | ORAL | 3 refills | Status: DC

## 2019-01-30 ENCOUNTER — Other Ambulatory Visit: Payer: Self-pay | Admitting: Nurse Practitioner

## 2019-02-02 ENCOUNTER — Other Ambulatory Visit: Payer: Self-pay

## 2019-02-02 ENCOUNTER — Encounter: Payer: Self-pay | Admitting: Family

## 2019-02-02 ENCOUNTER — Ambulatory Visit (INDEPENDENT_AMBULATORY_CARE_PROVIDER_SITE_OTHER): Payer: PPO | Admitting: Family

## 2019-02-02 VITALS — BP 138/78 | HR 77 | Temp 97.7°F | Ht 63.0 in | Wt 202.0 lb

## 2019-02-02 DIAGNOSIS — J45909 Unspecified asthma, uncomplicated: Secondary | ICD-10-CM | POA: Diagnosis not present

## 2019-02-02 DIAGNOSIS — R7303 Prediabetes: Secondary | ICD-10-CM | POA: Diagnosis not present

## 2019-02-02 DIAGNOSIS — E785 Hyperlipidemia, unspecified: Secondary | ICD-10-CM

## 2019-02-02 DIAGNOSIS — K219 Gastro-esophageal reflux disease without esophagitis: Secondary | ICD-10-CM | POA: Diagnosis not present

## 2019-02-02 DIAGNOSIS — G609 Hereditary and idiopathic neuropathy, unspecified: Secondary | ICD-10-CM | POA: Diagnosis not present

## 2019-02-02 DIAGNOSIS — R350 Frequency of micturition: Secondary | ICD-10-CM | POA: Diagnosis not present

## 2019-02-02 DIAGNOSIS — I10 Essential (primary) hypertension: Secondary | ICD-10-CM

## 2019-02-02 LAB — POCT URINALYSIS DIPSTICK
Blood, UA: NEGATIVE
Glucose, UA: NEGATIVE
Ketones, UA: NEGATIVE
Leukocytes, UA: NEGATIVE
Nitrite, UA: NEGATIVE
Protein, UA: POSITIVE — AB
Spec Grav, UA: 1.01 (ref 1.010–1.025)
Urobilinogen, UA: 1 E.U./dL
pH, UA: 5 (ref 5.0–8.0)

## 2019-02-02 NOTE — Progress Notes (Signed)
Provider: Dinah Ngetich FNP-C  Lauree Chandler, NP  Patient Care Team: Lauree Chandler, NP as PCP - General (Nurse Practitioner)  Extended Emergency Contact Information Primary Emergency Contact: Ronney Asters, [place] Phone: [phone] Relation: Relative Secondary Emergency Contact: Jerald Kief Address: [place] [address] Johnnette Litter of [place] Phone: [phone] Work Phone: [phone] Relation: Son  Code Status:  Goals of care: Advanced Directive information Advanced Directives 10/15/2018  Does Patient Have a Medical Advance Directive? Yes  Type of Advance Directive Out of facility DNR (pink MOST or yellow form)  Does patient want to make changes to medical advance directive? No - Patient declined  Would patient like information on creating a medical advance directive? -  Pre-existing out of facility DNR order (yellow form or pink MOST form) Yellow form placed in chart (order not valid for inpatient use)     Chief Complaint  Patient presents with  . Acute Visit    Patient c/o of lower left leg and foot swelling with redness and urinary frequency states she thinks she has uti, patient states she has a little bit wheezing but could be coming from gastric reflux     HPI:  Pt is a 78 y.o. female seen today for an acute visit for evaluation of left lower leg and foot swelling with redness.she states had swelling and redness yesterday but none today.she has had numbness and tingling on both feet.she denies any skin color changes.she enies any weakness on the legs.Has chronic lower back pain.she has a medical history of Hypertension,prediabetes,hx of smoking among other conditions.she has bilateral foot callus that she follows up with Podiatrist states will schedule a n appointment.   She also complains of increased urinary frequency.she gets up several times during the night.she  thinks she has UTI.No abdominal pain,urgency or dysuria,fever or chills reported.  She states  has a little bit wheezing but could be coming from gastric reflux.she has a history of asthma but has not been using her ProAir [place].she uses her Advair as directed. No exposure to person sick with COVID-19.she wears her facial mask and practices hand hygiene and social distancing.    Past Medical History:  Diagnosis Date  . ALLERGIC RHINITIS   . Anxiety   . Asthma   . Blood type O+   . Cervical strain, acute   . Cirrhosis ([place])   . Colon polyp 2010   adenoma  . Diverticulosis of colon   . Dizziness   . Esophageal varices ([place])   . Fibromyalgia   . GERD (gastroesophageal reflux disease)   . History of nephrolithiasis   . Hypercholesterolemia    borderline  . Hypertension   . IBS (irritable bowel syndrome)   . Osteoarthritis   . Personal history of allergy to unspecified medicinal agent   . Postconcussion syndrome   . Vitamin D deficiency    Past Surgical History:  Procedure Laterality Date  . CHOLECYSTECTOMY, LAPAROSCOPIC  2004   for gallstone pancreatitis  . KNEE SURGERY Right   . KNEE SURGERY Left   . THYROID SURGERY      Allergies  Allergen Reactions  . Ace Inhibitors   . Amoxicillin Itching    REACTION: unknown? pain in right kidney  . Benicar Hct [Olmesartan Medoxomil-Hctz]     Extreme weakness  . Budesonide-Formoterol Fumarate  Causes tremors and numbness  . Chlorzoxazone [Chlorzoxazone]   . Citalopram Hydrobromide     REACTION: hives  . Citalopram Hydrobromide   . Clonidine Derivatives   . Droperidol     REACTION: hives  . Flexeril [Cyclobenzaprine Hcl]   . Fluconazole Nausea And Vomiting and Other (See Comments)    fatigue  . Ketorolac Tromethamine     REACTION: hives  . Ketorolac Tromethamine   . Metoclopramide Hcl   . Mometasone Furo-Formoterol Fum     Causes sore throat, blurred vision and unable to sleep--started on med 09-17-10  . Morphine      REACTION: hives and itching  . Olmesartan Medoxomil     REACTION: fatique  . Breo Ellipta [Fluticasone Furoate-Vilanterol] Itching    Pt reports itching with [place] is related to being lactose intolerant.     Outpatient Encounter Medications as of 02/02/2019  Medication Sig  . acetaminophen (TYLENOL) 500 MG tablet Take 1,000 mg by mouth 2 (two) times a day.  Marland Kitchen ADVAIR HFA 115-21 MCG/ACT inhaler TAKE 2 PUFFS BY MOUTH TWICE A DAY  . amLODipine (NORVASC) 5 MG tablet Take 1 tablet (5 mg total) by mouth daily.  . calcium carbonate (TUMS - DOSED IN MG ELEMENTAL CALCIUM) 500 MG chewable tablet Chew 1 tablet by mouth as needed for indigestion or heartburn.  . Calcium Carbonate-Vitamin D (CALCIUM 600+D) 600-400 MG-UNIT tablet Take 1 tablet by mouth 2 (two) times daily.  . Cetirizine HCl (ZYRTEC ALLERGY) 10 MG CAPS Take 10 mg by mouth daily.   . Cholecalciferol (VITAMIN D3) 1000 UNITS CAPS Take 1,000 Units by mouth daily.   Marland Kitchen esomeprazole (NEXIUM) 40 MG capsule Take 1 capsule (40 mg total) by mouth 2 (two) times daily before a meal.  . hydrochlorothiazide (HYDRODIURIL) 25 MG tablet Take 1 tablet (25 mg total) by mouth daily.  Marland Kitchen KLOR-CON M20 20 MEQ tablet TAKE 1 TABLET BY MOUTH EVERY DAY  . meclizine (ANTIVERT) 25 MG tablet TAKE 1 TABLET 3 TIMES A DAY AS NEEDED FOR DIZZINESS/NAUSEA  . Multiple Vitamins-Minerals (CENTRUM SILVER PO) Take 1 tablet by mouth daily.   . Omega-3 Fatty Acids (FISH OIL MAXIMUM STRENGTH) 1200 MG CAPS Take 1,200 mg by mouth 2 (two) times daily.   Marland Kitchen PROAIR HFA 108 (90 Base) MCG/ACT inhaler INHALE 2 PUFFS EVERY 4 HOURS AS NEEDED INTO THE LUNGS FOR WHEEZING OR SHORTNESS OF BREATH  . propranolol (INDERAL) 10 MG tablet TAKE 1 TABLET BY MOUTH TWICE A DAY  . Propylene Glycol (SYSTANE BALANCE) 0.6 % SOLN Place 1 drop into both ears 2 (two) times daily.  Marland Kitchen SALINE NASAL SPRAY NA Place 1 spray into both nostrils 2 (two) times daily.   . [DISCONTINUED] benzonatate (TESSALON) 100 MG capsule  TAKE 1 CAPSULE BY MOUTH THREE TIMES A DAY AS NEEDED FOR COUGH   No facility-administered encounter medications on file as of 02/02/2019.     Review of Systems  Constitutional: Negative for appetite change, chills, fatigue and fever.  HENT: Negative for congestion, rhinorrhea, sinus pressure, sinus pain, sneezing and sore throat.   Eyes: Negative for discharge, redness, itching and visual disturbance.  Respiratory: Negative for cough, chest tightness and shortness of breath.        Wheezing occasional due chronic bronchitis   Cardiovascular: Negative for chest pain, palpitations and leg swelling.  Gastrointestinal: Negative for abdominal distention, abdominal pain, constipation, diarrhea, nausea and vomiting.       GERD   Endocrine: Negative for cold intolerance, heat intolerance,  polydipsia, polyphagia and polyuria.  Genitourinary: Positive for frequency. Negative for decreased urine volume, difficulty urinating, dysuria and urgency.       Gets up several times during the night  Musculoskeletal: Positive for back pain. Negative for gait problem.       Painful callus on both feet   Skin: Negative for color change, pallor, rash and wound.  Neurological: Positive for numbness. Negative for dizziness, weakness, light-headedness and headaches.       Worsening numbness and tingling on both feet.  Psychiatric/Behavioral: Negative for agitation and confusion. The patient is not nervous/anxious.        Sleeps late at night     Immunization History  Administered Date(s) Administered  . Hepatitis A, Adult 10/15/2017, 04/17/2018  . Influenza Split 02/11/2011, 02/25/2012  . Influenza Whole 02/10/2009, 02/26/2010  . Influenza, High Dose Seasonal PF 04/24/2017, 01/26/2018, 01/26/2018, 01/03/2019  . Influenza,inj,Quad PF,6+ Mos 02/09/2014, 03/20/2015, 02/27/2016  . Influenza-Unspecified 03/13/2013, 03/13/2017  . Pneumococcal Conjugate-13 02/27/2016  . Pneumococcal Polysaccharide-23 08/25/2014  .  Tdap 12/06/2013  . Zoster Recombinat (Shingrix) 01/13/2019   Pertinent  Health Maintenance Due  Topic Date Due  . INFLUENZA VACCINE  12/12/2018  . COLONOSCOPY  06/25/2020  . DEXA SCAN  Completed  . PNA vac Low Risk Adult  Completed   Fall Risk  10/15/2018 09/24/2018 09/14/2018 09/08/2018 08/13/2018  Falls in the past year? 0 0 0 0 0  Number falls in past yr: 0 0 0 0 0  Injury with Fall? 0 0 0 0 0  Follow up - - - - -    Vitals:   02/02/19 1013  BP: 138/78  Pulse: 77  Temp: 97.7 F (36.5 C)  TempSrc: Temporal  SpO2: 97%  Weight: 202 lb (91.6 kg)  Height: 5' 3" (1.6 m)   Body mass index is 35.78 kg/m. Physical Exam Constitutional:      General: She is not in acute distress.    Appearance: She is obese. She is not ill-appearing.  HENT:     Head: Normocephalic.     Nose: Nose normal. No congestion or rhinorrhea.     Mouth/Throat:     Mouth: Mucous membranes are moist.     Pharynx: Oropharynx is clear. No oropharyngeal exudate or posterior oropharyngeal erythema.  Eyes:     General: No scleral icterus.       Right eye: No discharge.        Left eye: No discharge.     Extraocular Movements: Extraocular movements intact.     Conjunctiva/sclera: Conjunctivae normal.     Pupils: Pupils are equal, round, and reactive to light.  Neck:     Musculoskeletal: Normal range of motion. No neck rigidity or muscular tenderness.  Cardiovascular:     Rate and Rhythm: Normal rate and regular rhythm.     Pulses: Normal pulses.     Heart sounds: Normal heart sounds. No murmur. No friction rub. No gallop.   Pulmonary:     Effort: Pulmonary effort is normal. No respiratory distress.     Breath sounds: Normal breath sounds. No wheezing, rhonchi or rales.  Chest:     Chest wall: No tenderness.  Abdominal:     General: Bowel sounds are normal. There is no distension.     Palpations: Abdomen is soft. There is no mass.     Tenderness: There is no abdominal tenderness. There is no right CVA  tenderness, left CVA tenderness, guarding or rebound.  Musculoskeletal:  General: No swelling or tenderness.     Right lower leg: No edema.     Left lower leg: No edema.     Comments: Gait steady with right cane   Lymphadenopathy:     Cervical: No cervical adenopathy.  Skin:    General: Skin is warm and dry.     Coloration: Skin is not pale.     Findings: No bruising, erythema or rash.  Neurological:     Mental Status: She is alert and oriented to person, place, and time.     Cranial Nerves: No cranial nerve deficit.     Motor: No weakness.     Coordination: Coordination normal.     Gait: Gait abnormal.     Comments: Pin prick sensation decrease on heel area.  Psychiatric:        Mood and Affect: Mood normal.        Behavior: Behavior normal.        Thought Content: Thought content normal.        Judgment: Judgment normal.    Labs reviewed: Recent Labs    06/18/18 0947 06/25/18 0943 09/02/18 0218  NA 141 143 137  K 4.4 4.4 3.9  CL 103 103 101  CO2 30 32 23  GLUCOSE 107* 110* 143*  BUN _0 CREATININE 0.79 0.75 0.80  CALCIUM 9.9 9.9 9.6   Recent Labs    06/18/18 0947 06/25/18 0943 09/02/18 0218  AST 76* 69* 56*  ALT 96* 91* 66*  ALKPHOS  --  89 123  BILITOT 0.6 0.8 0.8  PROT 6.8 6.8 6.3*  ALBUMIN  --  4.4 3.6   Recent Labs    03/30/18 1106 06/18/18 0947 09/02/18 0218  WBC 8.1 8.7 9.2  NEUTROABS 3,540 4,411 3.7  HGB 15.1 15.3 14.7  HCT 44.4 45.5* 44.1  MCV 83.8 86.5 87.5  PLT 293 311 268   Lab Results  Component Value Date   TSH 3.080 09/23/2014   Lab Results  Component Value Date   HGBA1C 6.2 (H) 03/30/2018   Lab Results  Component Value Date   CHOL 201 (H) 06/18/2018   HDL 46 (L) 06/18/2018   LDLCALC 118 (H) 06/18/2018   LDLDIRECT 102.5 07/02/2012   TRIG 261 (H) 06/18/2018   CHOLHDL 4.4 06/18/2018    Significant Diagnostic Results in last 30 days:  No results found.  Assessment/Plan 1. Urinary frequency  Afebrile.encouraged to increase fluid intact.will rule out UTI. - POC Urinalysis Dipstick negative for Nitritites and leukocytes but positive for protein.will send urine for Micro Albumin ?Type 2 DM  2. Essential hypertension B/p stable.continue on Inderal 10 mg tablet daily,HCZT 25 mg tablet daily and Amlodipine 5 mg tablet daily.Not on ASA or statin  - CBC with Differential/Platelet - CMP with eGFR([facility]) - TSH  3. Hyperlipidemia, unspecified hyperlipidemia type Continue on Omega -3 fatty acids 1200 mg capsule twice daily.continue with diet modification and exercise.  - Lipid Panel  4. Asthmatic bronchitis without complication, unspecified asthma severity, unspecified whether persistent Reports occasional wheezing.Lungs CTA.continue on ProAir as needed and Advair  115-48mg /ACT in[place]ler twice daily and Loratadine 10 mg tablet twice daily.    5. Gastroesophageal reflux disease without esophagitis Symptoms controlled.continue on Nexium 40 mg capsule twice daily.   6. Prediabetes Lab Results  Component Value Date   HGBA1C 6.2 (H) 03/30/2018  Not on medication.continue with diet modification and exercise. - Hemoglobin A1c - Microalbumin/Creatinine Ratio, Urine  7. Idiopathic peripheral neuropathy Continue on  to monitor.states symptoms not to bad to require medication.will initiate Gabapentin if needed.  Family/ staff Communication: Reviewed plan of care with patient   Labs/tests ordered:  - CBC with Differential/Platelet - CMP with eGFR([facility]) - TSH - Lipid Panel - Hemoglobin A1c Dinah C Ngetich, NP

## 2019-02-02 NOTE — Patient Instructions (Signed)
Follow up with Podiatrist for feet callus. Notify provider's office if Numbness and tingling worsen.

## 2019-02-03 LAB — COMPLETE METABOLIC PANEL WITH GFR
AG Ratio: 1.6 (calc) (ref 1.0–2.5)
ALT: 99 U/L — ABNORMAL HIGH (ref 6–29)
AST: 99 U/L — ABNORMAL HIGH (ref 10–35)
Albumin: 4.1 g/dL (ref 3.6–5.1)
Alkaline phosphatase (APISO): 94 U/L (ref 37–153)
BUN: 16 mg/dL (ref 7–25)
CO2: 31 mmol/L (ref 20–32)
Calcium: 10 mg/dL (ref 8.6–10.4)
Chloride: 99 mmol/L (ref 98–110)
Creat: 0.66 mg/dL (ref 0.60–0.88)
GFR, Est African American: 97 mL/min/{1.73_m2} (ref >=60)
GFR, Est Non African American: 83 mL/min/{1.73_m2} (ref >=60)
Globulin: 2.6 g/dL (calc) (ref 1.9–3.7)
Glucose, Bld: 114 mg/dL — ABNORMAL HIGH (ref 65–99)
Potassium: 4.1 mmol/L (ref 3.5–5.3)
Sodium: 138 mmol/L (ref 135–146)
Total Bilirubin: 0.8 mg/dL (ref 0.2–1.2)
Total Protein: 6.7 g/dL (ref 6.1–8.1)

## 2019-02-03 LAB — CBC WITH DIFFERENTIAL/PLATELET
Absolute Monocytes: 692 cells/uL (ref 200–950)
Basophils Absolute: 61 cells/uL (ref 0–200)
Basophils Relative: 0.8 %
Eosinophils Absolute: 106 cells/uL (ref 15–500)
Eosinophils Relative: 1.4 %
HCT: 42.2 % (ref 35.0–45.0)
Hemoglobin: 14.5 g/dL (ref 11.7–15.5)
Lymphs Abs: 3678 cells/uL (ref 850–3900)
MCH: 29.7 pg (ref 27.0–33.0)
MCHC: 34.4 g/dL (ref 32.0–36.0)
MCV: 86.5 fL (ref 80.0–100.0)
MPV: 10.6 fL (ref 7.5–12.5)
Monocytes Relative: 9.1 %
Neutro Abs: 3063 cells/uL (ref 1500–7800)
Neutrophils Relative %: 40.3 %
Platelets: 296 10*3/uL (ref 140–400)
RBC: 4.88 10*6/uL (ref 3.80–5.10)
RDW: 13.1 % (ref 11.0–15.0)
Total Lymphocyte: 48.4 %
WBC: 7.6 10*3/uL (ref 3.8–10.8)

## 2019-02-03 LAB — HEMOGLOBIN A1C
Hgb A1c MFr Bld: 6.2 % of total Hgb — ABNORMAL HIGH (ref <5.7)
Mean Plasma Glucose: 131 (calc)
eAG (mmol/L): 7.3 (calc)

## 2019-02-03 LAB — MICROALBUMIN / CREATININE URINE RATIO
Creatinine, Urine: 80 mg/dL (ref 20–275)
Microalb Creat Ratio: 96 mcg/mg creat — ABNORMAL HIGH (ref <30)
Microalb, Ur: 7.7 mg/dL

## 2019-02-03 LAB — LIPID PANEL
Cholesterol: 201 mg/dL — ABNORMAL HIGH (ref <200)
HDL: 46 mg/dL — ABNORMAL LOW (ref >=50)
LDL Cholesterol (Calc): 118 mg/dL (calc) — ABNORMAL HIGH
Non-HDL Cholesterol (Calc): 155 mg/dL (calc) — ABNORMAL HIGH (ref <130)
Total CHOL/HDL Ratio: 4.4 (calc) (ref <5.0)
Triglycerides: 259 mg/dL — ABNORMAL HIGH (ref <150)

## 2019-02-03 LAB — TSH: TSH: 1.69 mIU/L (ref 0.40–4.50)

## 2019-02-05 ENCOUNTER — Other Ambulatory Visit: Payer: Self-pay

## 2019-02-05 MED ORDER — LISINOPRIL 5 MG PO TABS: 5.0000 mg | ORAL_TABLET | Freq: Every day | ORAL | 0 refills | Status: DC

## 2019-02-16 ENCOUNTER — Ambulatory Visit: Payer: PPO | Admitting: Nurse Practitioner

## 2019-02-17 ENCOUNTER — Ambulatory Visit (INDEPENDENT_AMBULATORY_CARE_PROVIDER_SITE_OTHER): Payer: PPO | Admitting: Nurse Practitioner

## 2019-02-17 ENCOUNTER — Encounter: Payer: Self-pay | Admitting: Nurse Practitioner

## 2019-02-17 ENCOUNTER — Other Ambulatory Visit: Payer: Self-pay

## 2019-02-17 VITALS — BP 126/72 | HR 70 | Temp 98.0°F | Ht 63.0 in | Wt 199.0 lb

## 2019-02-17 DIAGNOSIS — E669 Obesity, unspecified: Secondary | ICD-10-CM

## 2019-02-17 DIAGNOSIS — K7460 Unspecified cirrhosis of liver: Secondary | ICD-10-CM | POA: Diagnosis not present

## 2019-02-17 DIAGNOSIS — I10 Essential (primary) hypertension: Secondary | ICD-10-CM

## 2019-02-17 DIAGNOSIS — R7303 Prediabetes: Secondary | ICD-10-CM | POA: Diagnosis not present

## 2019-02-17 DIAGNOSIS — Z66 Do not resuscitate: Secondary | ICD-10-CM | POA: Diagnosis not present

## 2019-02-17 DIAGNOSIS — K219 Gastro-esophageal reflux disease without esophagitis: Secondary | ICD-10-CM

## 2019-02-17 DIAGNOSIS — R809 Proteinuria, unspecified: Secondary | ICD-10-CM | POA: Diagnosis not present

## 2019-02-17 DIAGNOSIS — J4520 Mild intermittent asthma, uncomplicated: Secondary | ICD-10-CM

## 2019-02-17 DIAGNOSIS — E66811 Obesity, class 1: Secondary | ICD-10-CM

## 2019-02-17 MED ORDER — LOSARTAN POTASSIUM 25 MG PO TABS: 25.0000 mg | ORAL_TABLET | Freq: Every day | ORAL | 1 refills | Status: DC

## 2019-02-17 MED ORDER — LOSARTAN POTASSIUM 25 MG PO TABS: 50.0000 mg | ORAL_TABLET | Freq: Every day | ORAL | 1 refills | Status: DC

## 2019-02-17 NOTE — Progress Notes (Signed)
Careteam: Patient Care Team: Lauree Chandler, NP as PCP - General (Nurse Practitioner)  Advanced Directive information Does Patient Have a Medical Advance Directive?: Yes, Type of Advance Directive: Out of facility DNR (pink MOST or yellow form), Pre-existing out of facility DNR order (yellow form or pink MOST form): Yellow form placed in chart (order not valid for inpatient use), Does patient want to make changes to medical advance directive?: No - Patient declined  Allergies  Allergen Reactions  . Ace Inhibitors   . Amoxicillin Itching    REACTION: unknown? pain in right kidney  . Benicar Hct [Olmesartan Medoxomil-Hctz]     Extreme weakness  . Budesonide-Formoterol Fumarate     Causes tremors and numbness  . Chlorzoxazone [Chlorzoxazone]   . Citalopram Hydrobromide     REACTION: hives  . Citalopram Hydrobromide   . Clonidine Derivatives   . Droperidol     REACTION: hives  . Flexeril [Cyclobenzaprine Hcl]   . Fluconazole Nausea And Vomiting and Other (See Comments)    fatigue  . Ketorolac Tromethamine     REACTION: hives  . Ketorolac Tromethamine   . Metoclopramide Hcl   . Mometasone Furo-Formoterol Fum     Causes sore throat, blurred vision and unable to sleep--started on med 09-17-10  . Morphine     REACTION: hives and itching  . Olmesartan Medoxomil     REACTION: fatique  . Breo Ellipta [Fluticasone Furoate-Vilanterol] Itching    Pt reports itching with Memory Dance is related to being lactose intolerant.     Chief Complaint  Patient presents with  . Medical Management of Chronic Issues    4 month follow-up   . Advanced Directive    Reactivate DNR order      HPI: Patient is a 78 y.o. female seen in the office today for routine follow up.  She saw Dinah NP for acute visit last month and was started on lisinopril due to protein noted in urine.   htn-controlled on current regimen, tolerating lisinopril except for possible increase in cough and wheezing  GERD-  continues on nexium and tums.   Asthma/allergies- ongoing wheezing. Feels like it is related to GERD. Trying to modify diet.   Overweight- continues to eat sweet (candy and cakes) but trying to modify diet after last visit.   Cirrhosis of the liver- continues to eat fatty diet. Continues on propranolol   Review of Systems:  Review of Systems  Constitutional: Negative for chills, fever and malaise/fatigue.  HENT: Positive for hearing loss. Negative for congestion, ear pain and sore throat.        Nose bleeds have stopped  Respiratory: Positive for cough (ongoing) and sputum production (clear). Negative for shortness of breath and wheezing.   Cardiovascular: Negative for chest pain, palpitations and leg swelling.  Gastrointestinal: Positive for heartburn (tums helps with nexium). Negative for abdominal pain, constipation, diarrhea, nausea and vomiting.  Genitourinary: Negative for dysuria, flank pain, frequency, hematuria and urgency.  Musculoskeletal: Positive for back pain. Negative for falls and joint pain.  Skin: Negative.   Neurological: Positive for tingling. Negative for dizziness and headaches.  Psychiatric/Behavioral: Negative for depression and memory loss. The patient is nervous/anxious. The patient does not have insomnia.    Past Medical History:  Diagnosis Date  . ALLERGIC RHINITIS   . Anxiety   . Asthma   . Blood type O+   . Cervical strain, acute   . Cirrhosis ([place])   . Colon polyp 2010   adenoma  .  Diverticulosis of colon   . Dizziness   . Esophageal varices ([place])   . Fibromyalgia   . GERD (gastroesophageal reflux disease)   . History of nephrolithiasis   . Hypercholesterolemia    borderline  . Hypertension   . IBS (irritable bowel syndrome)   . Osteoarthritis   . Personal history of allergy to unspecified medicinal agent   . Postconcussion syndrome   . Vitamin D deficiency    Past Surgical History:  Procedure Laterality Date  . CHOLECYSTECTOMY,  LAPAROSCOPIC  2004   for gallstone pancreatitis  . KNEE SURGERY Right   . KNEE SURGERY Left   . THYROID SURGERY     Social History:   reports that she quit smoking about 56 years ago. Her smoking use included cigarettes. She has a 16.00 pack-year smoking history. She has never used smokeless tobacco. She reports that she does not drink alcohol or use drugs.  Family History  Problem Relation Age of Onset  . Breast cancer Mother   . Alzheimer's disease Mother   . Heart disease Mother   . COPD Brother   . Heart disease Sister   . Breast cancer Sister 4  . Alcohol abuse Sister   . Ovarian cancer Paternal Grandmother   . Arthritis Other        Cousins   . COPD Cousin        Maternal side   . Heart attack Maternal Uncle     Medications: Patient's Medications  New Prescriptions   No medications on file  Previous Medications   ACETAMINOPHEN (TYLENOL) 500 MG TABLET    Take 1,000 mg by mouth 2 (two) times a day.   ADVAIR HFA 115-21 MCG/ACT INHALER    TAKE 2 PUFFS BY MOUTH TWICE A DAY   AMLODIPINE (NORVASC) 5 MG TABLET    Take 1 tablet (5 mg total) by mouth daily.   CALCIUM CARBONATE (TUMS - DOSED IN MG ELEMENTAL CALCIUM) 500 MG CHEWABLE TABLET    Chew 1 tablet by mouth as needed for indigestion or heartburn.   CALCIUM CARBONATE-VITAMIN D (CALCIUM 600+D) 600-400 MG-UNIT TABLET    Take 1 tablet by mouth 2 (two) times daily.   CETIRIZINE HCL (ZYRTEC ALLERGY) 10 MG CAPS    Take 10 mg by mouth daily.    CHOLECALCIFEROL (VITAMIN D3) 1000 UNITS CAPS    Take 1,000 Units by mouth daily.    ESOMEPRAZOLE (NEXIUM) 40 MG CAPSULE    Take 1 capsule (40 mg total) by mouth 2 (two) times daily before a meal.   HYDROCHLOROTHIAZIDE (HYDRODIURIL) 25 MG TABLET    Take 1 tablet (25 mg total) by mouth daily.   KLOR-CON M20 20 MEQ TABLET    TAKE 1 TABLET BY MOUTH EVERY DAY   LISINOPRIL (ZESTRIL) 5 MG TABLET    Take 1 tablet (5 mg total) by mouth daily.   MECLIZINE (ANTIVERT) 25 MG TABLET    TAKE 1 TABLET 3  TIMES A DAY AS NEEDED FOR DIZZINESS/NAUSEA   MULTIPLE VITAMINS-MINERALS (CENTRUM SILVER PO)    Take 1 tablet by mouth daily.    OMEGA-3 FATTY ACIDS (FISH OIL MAXIMUM STRENGTH) 1200 MG CAPS    Take 1,200 mg by mouth 2 (two) times daily.    PROAIR HFA 108 (90 BASE) MCG/ACT INHALER    INHALE 2 PUFFS EVERY 4 HOURS AS NEEDED INTO THE LUNGS FOR WHEEZING OR SHORTNESS OF BREATH   PROPRANOLOL (INDERAL) 10 MG TABLET    TAKE 1 TABLET BY MOUTH  TWICE A DAY   PROPYLENE GLYCOL (SYSTANE BALANCE) 0.6 % SOLN    Place 1 drop into both ears 2 (two) times daily.   SALINE NASAL SPRAY NA    Place 1 spray into both nostrils 2 (two) times daily.   Modified Medications   No medications on file  Discontinued Medications   No medications on file    Physical Exam:  Vitals:   02/17/19 1101  BP: 126/72  Pulse: 70  Temp: 98 F (36.7 C)  TempSrc: Temporal  SpO2: 96%  Weight: 199 lb (90.3 kg)  Height: 5' 3"  (1.6 m)   Body mass index is 35.25 kg/m. Wt Readings from Last 3 Encounters:  02/17/19 199 lb (90.3 kg)  02/02/19 202 lb (91.6 kg)  09/04/18 191 lb (86.6 kg)    Physical Exam Constitutional:      General: She is not in acute distress.    Appearance: She is obese. She is not ill-appearing.  HENT:     Head: Normocephalic.     Nose: Nose normal. No congestion or rhinorrhea.     Mouth/Throat:     Mouth: Mucous membranes are moist.     Pharynx: Oropharynx is clear. No oropharyngeal exudate or posterior oropharyngeal erythema.  Eyes:     General: No scleral icterus.       Right eye: No discharge.        Left eye: No discharge.     Extraocular Movements: Extraocular movements intact.     Conjunctiva/sclera: Conjunctivae normal.     Pupils: Pupils are equal, round, and reactive to light.  Neck:     Musculoskeletal: Normal range of motion. No neck rigidity or muscular tenderness.  Cardiovascular:     Rate and Rhythm: Normal rate and regular rhythm.     Pulses: Normal pulses.     Heart sounds: Normal  heart sounds. No murmur. No friction rub. No gallop.   Pulmonary:     Effort: Pulmonary effort is normal. No respiratory distress.     Breath sounds: Normal breath sounds. No wheezing, rhonchi or rales.  Chest:     Chest wall: No tenderness.  Abdominal:     General: Bowel sounds are normal. There is no distension.     Palpations: Abdomen is soft. There is no mass.     Tenderness: There is no abdominal tenderness. There is no right CVA tenderness, left CVA tenderness, guarding or rebound.  Musculoskeletal:        General: No swelling or tenderness.     Right lower leg: No edema.     Left lower leg: No edema.     Comments: Gait steady with right cane   Lymphadenopathy:     Cervical: No cervical adenopathy.  Skin:    General: Skin is warm and dry.     Coloration: Skin is not pale.     Findings: No bruising, erythema or rash.  Neurological:     Mental Status: She is alert and oriented to person, place, and time.     Cranial Nerves: No cranial nerve deficit.     Motor: No weakness.     Coordination: Coordination normal.     Gait: Gait abnormal (uses cane).  Psychiatric:        Mood and Affect: Mood normal.        Behavior: Behavior normal.        Thought Content: Thought content normal.        Judgment: Judgment normal.    Labs  reviewed: Basic Metabolic Panel: Recent Labs    06/25/18 0943 09/02/18 0218 02/02/19 1122  NA 143 137 138  K 4.4 3.9 4.1  CL 103 101 99  CO2 32 23 31  GLUCOSE 110* 143* 114*  BUN 20 17 16   CREATININE 0.75 0.80 0.66  CALCIUM 9.9 9.6 10.0  TSH  --   --  1.69   Liver Function Tests: Recent Labs    06/25/18 0943 09/02/18 0218 02/02/19 1122  AST 69* 56* 99*  ALT 91* 66* 99*  ALKPHOS 89 123  --   BILITOT 0.8 0.8 0.8  PROT 6.8 6.3* 6.7  ALBUMIN 4.4 3.6  --    No results for input(s): LIPASE, AMYLASE in the last 8760 hours. No results for input(s): AMMONIA in the last 8760 hours. CBC: Recent Labs    06/18/18 0947 09/02/18 0218 02/02/19  1122  WBC 8.7 9.2 7.6  NEUTROABS 4,411 3.7 3,063  HGB 15.3 14.7 14.5  HCT 45.5* 44.1 42.2  MCV 86.5 87.5 86.5  PLT 311 268 296   Lipid Panel: Recent Labs    03/30/18 1106 06/18/18 0947 02/02/19 1122  CHOL 193 201* 201*  HDL 40* 46* 46*  LDLCALC 117* 118* 118*  TRIG 238* 261* 259*  CHOLHDL 4.8 4.4 4.4   TSH: Recent Labs    02/02/19 1122  TSH 1.69   A1C: Lab Results  Component Value Date   HGBA1C 6.2 (H) 02/02/2019     Assessment/Plan 1. DNR no code (do not resuscitate) -wanted to make sure her DNR in active in epic - DNR (Do Not Resuscitate)  2. Prediabetes -ongoing education provided on diet modifications. - losartan (COZAAR) 25 MG tablet; Take 1 tablets (25 mg total) by mouth daily.  Dispense: 30 tablet; Refill: 1  3. Essential hypertension -stable at this time. Will stop lisinipril due to hx of asthma with increase in cough and have her take losartan 25 mg daily, to notify if she does not tolerate due to side effects.  - losartan (COZAAR) 25 MG tablet; Take 1 tablets (25 mg total) by mouth daily.  Dispense: 30 tablet; Refill: 1  4. Proteinuria, unspecified type -dc lisinopril due to cough - losartan (COZAAR) 25 MG tablet; Take 1 tablets (25 mg total) by mouth daily.  Dispense: 30 tablet; Refill: 1  5. Asthma, allergic, mild intermittent, uncomplicated -stable, given handicap placard due to increase shortness of breath with mobility and needing to use cane.  -continue current regimen.  6. Obesity (BMI 30.0-34.9) -discussed weight loss and low fat diet. She has attempted to make a few dietary changes but still eating a lot of sweets  7. Gastroesophageal reflux disease without esophagitis Ongoing, discussed dietary and lifestyle modifications, avoiding trigger foods and weight loss. Continues on nexium and tums  8. Hepatic cirrhosis, unspecified hepatic cirrhosis type, unspecified whether ascites present ([place]) Liver functions worse. Continues on  propranolol. Does not have follow up with GI scheduled so encouraged to call and make appt as she has not been seen in over 6 months with worsening liver enzymes. Encouraged low fat diet.   Next appt: 3 months, sooner if needed Chanay Nugent K. [place], [facility] [phone]

## 2019-02-17 NOTE — Patient Instructions (Addendum)
STOP lisinopril  Start Losartan 25 mg by mouth daily  Continue to work on weight loss through diet and increasing physical activity as tolerates.   Follow up in 3 months.   To make follow up with Anoka Cellar, MD [facility] [address], [place], [place] [address] Phone: [phone]

## 2019-03-06 ENCOUNTER — Other Ambulatory Visit: Payer: Self-pay | Admitting: Nurse Practitioner

## 2019-03-08 ENCOUNTER — Telehealth: Payer: Self-pay | Admitting: Gastroenterology

## 2019-03-08 NOTE — Telephone Encounter (Signed)
Called patient back and someone had told her you shouldn't have cinnamon or tea if you have cirrhosis. I told her there is no problem having those in her diet.

## 2019-03-10 IMAGING — US US ABDOMEN LIMITED
1 series · 14 of 25 positions shown · non-contrast
Comparison: CT, 07/05/2017.  Abdomen ultrasound, 07/04/2017.

CLINICAL DATA: Follow-up for cirrhosis. Elevated liver function
test.

EXAM:
ULTRASOUND ABDOMEN LIMITED RIGHT UPPER QUADRANT

[Series 1: us abdomen limited · 14 of 40 slices shown]
[im 1/40]
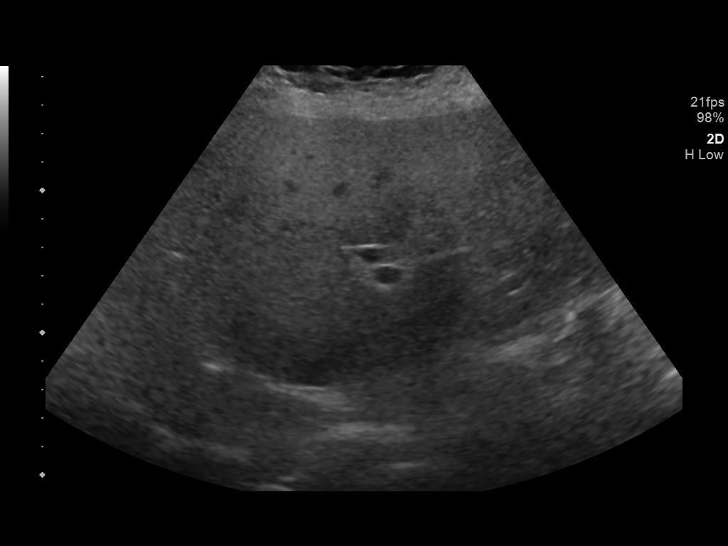
[im 4/40]
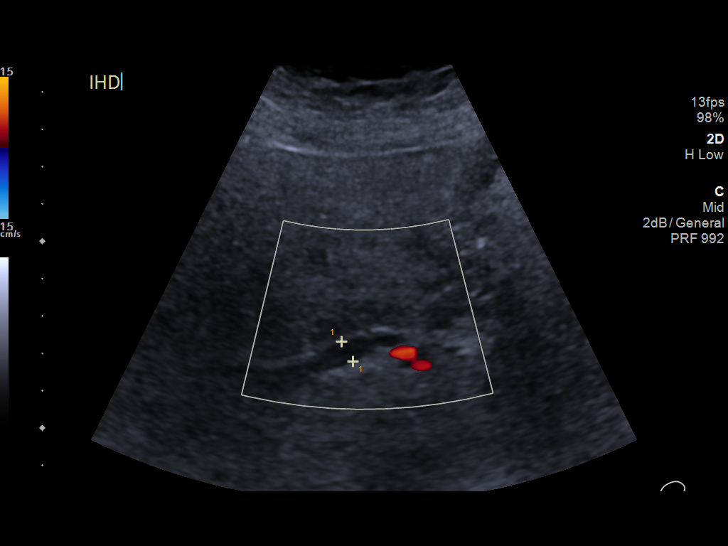
[im 7/40]
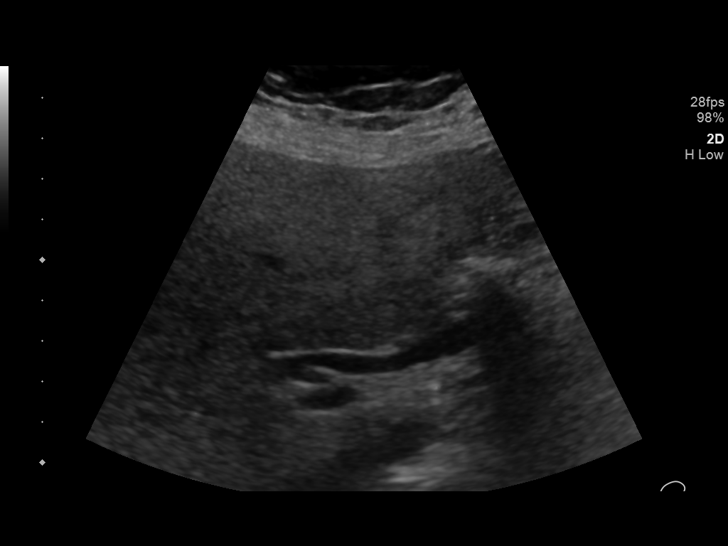
[im 10/40]
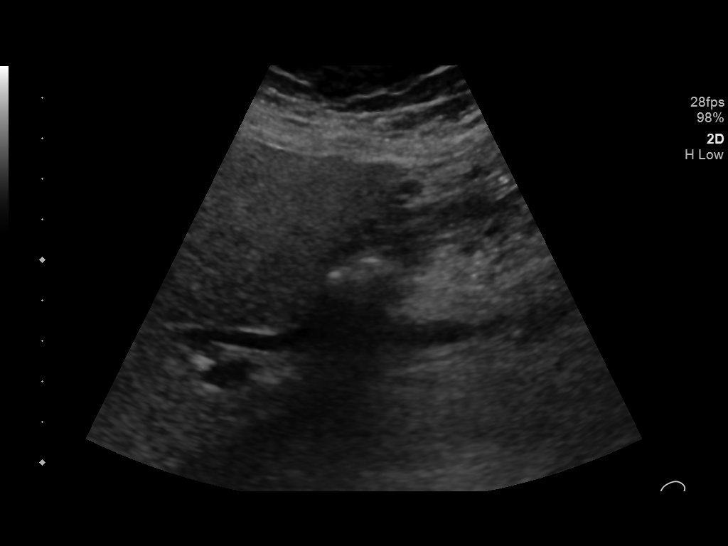
[im 14/40]
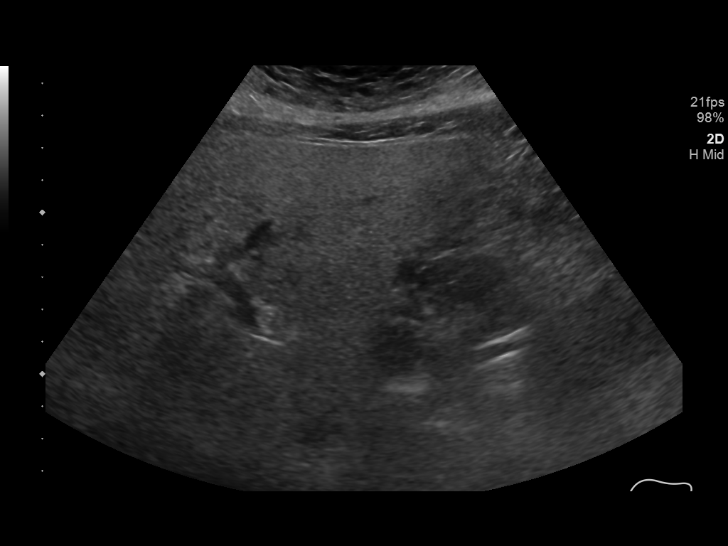
[im 15/40]
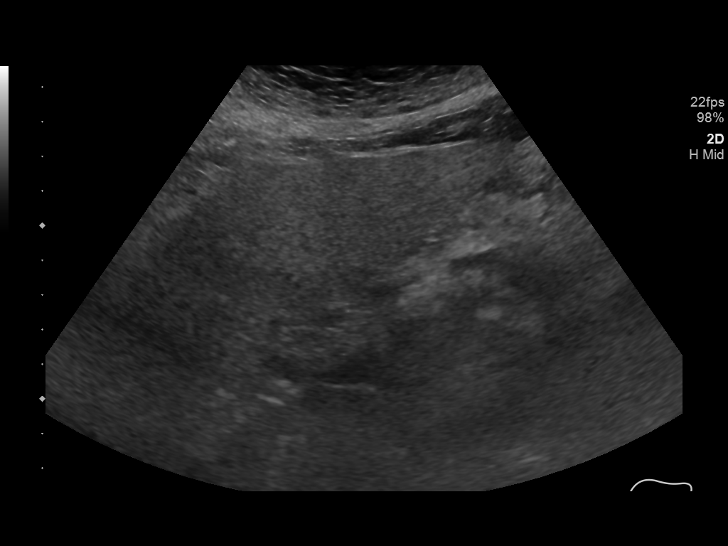
[im 18/40]
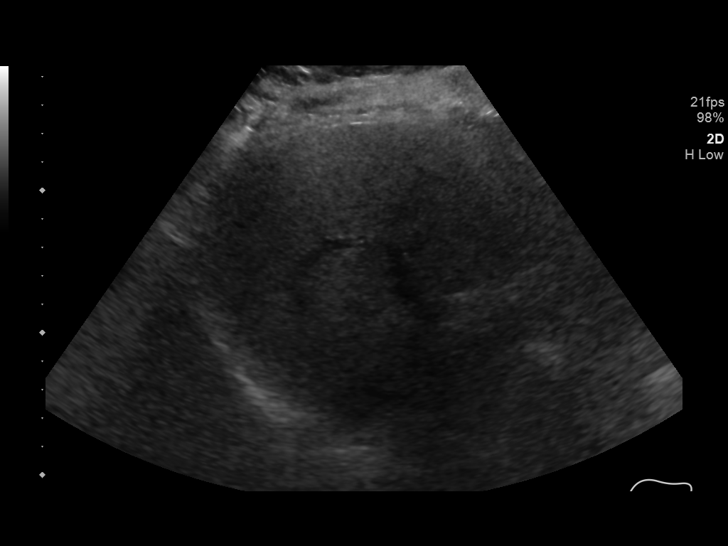
[im 22/40]
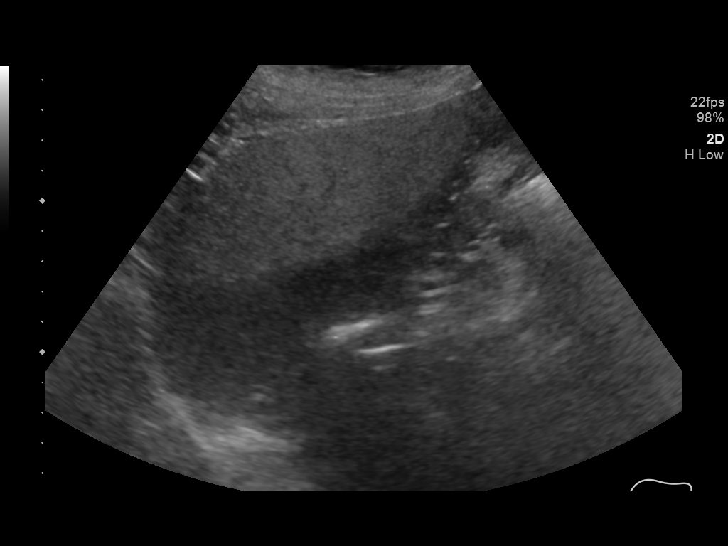
[im 25/40]
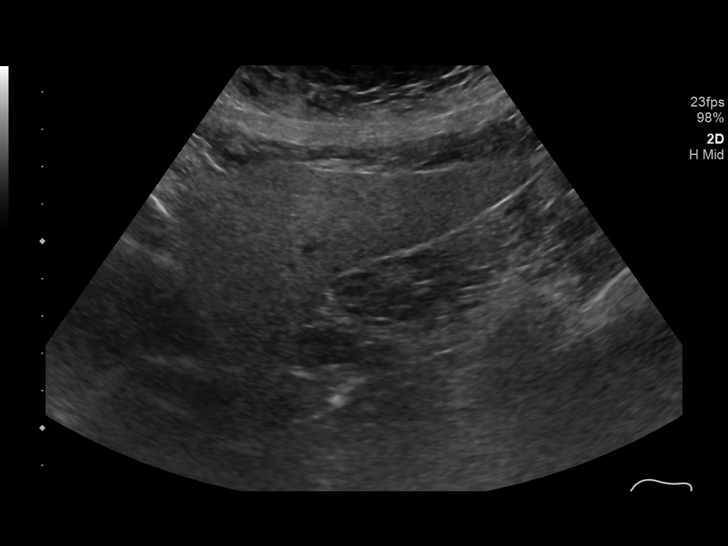
[im 27/40]
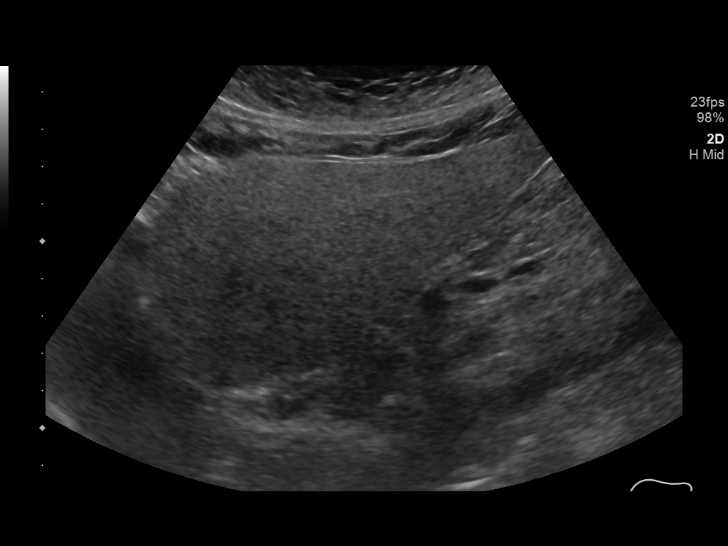
[im 30/40]
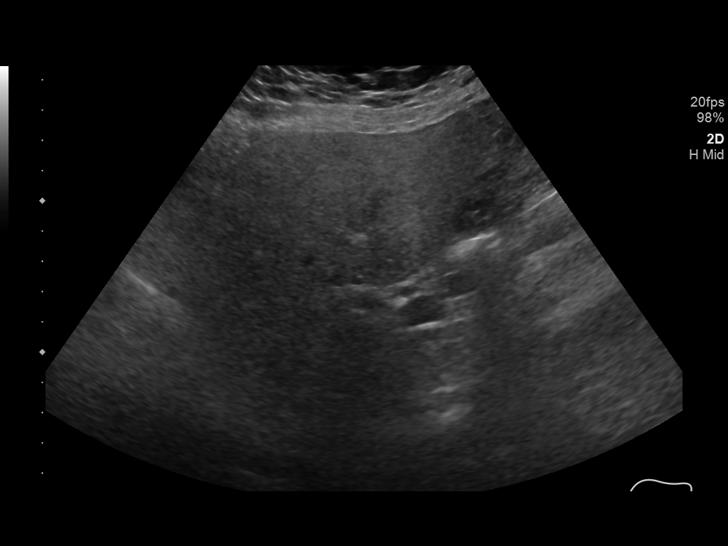
[im 33/40]
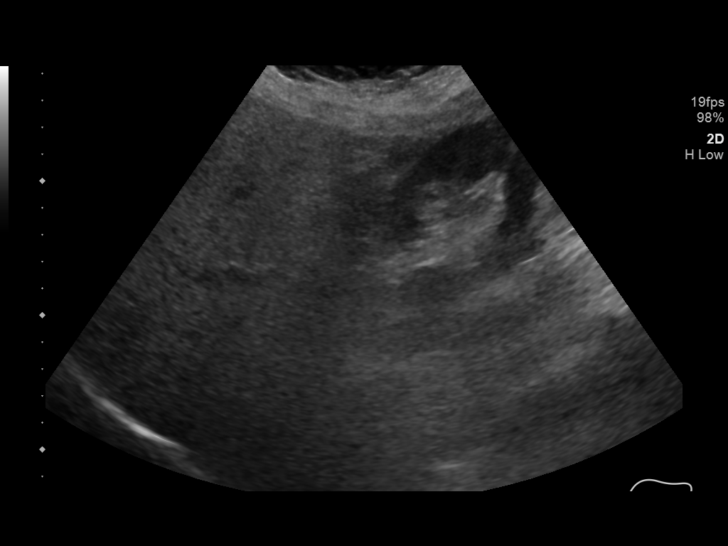
[im 36/40]
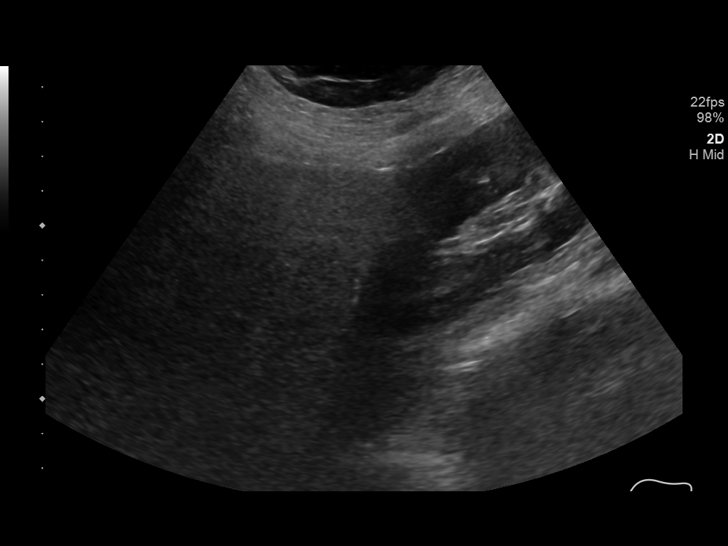
[im 40/40]
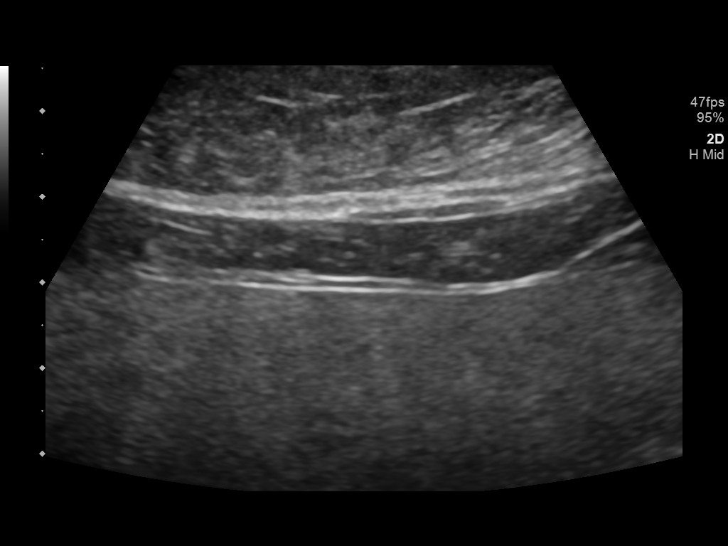

[14 of 25 positions shown; findings below may reference images not displayed]

FINDINGS: Gallbladder:

Surgically absent.

Common bile duct:

Diameter: 7 mm

Liver:

Coarsened echotexture. Diffusely increased liver parenchymal
echogenicity with decreased through transmission of the sound beam.
Subtle surface nodularity. Mild central intrahepatic bile duct
dilation. No mass or focal lesion. Portal vein is patent on color
Doppler imaging with normal direction of blood flow towards the
liver.
IMPRESSION: 1. No acute findings.
2. Liver appearance consistent with hepatic steatosis. Superimposed
cirrhosis is also consistent with the appearance. No liver mass or
focal lesion.
3. Status post cholecystectomy.

## 2019-03-11 ENCOUNTER — Other Ambulatory Visit: Payer: Self-pay | Admitting: Nurse Practitioner

## 2019-03-11 DIAGNOSIS — R809 Proteinuria, unspecified: Secondary | ICD-10-CM

## 2019-03-11 DIAGNOSIS — R7303 Prediabetes: Secondary | ICD-10-CM

## 2019-03-11 DIAGNOSIS — I10 Essential (primary) hypertension: Secondary | ICD-10-CM

## 2019-03-11 NOTE — Telephone Encounter (Signed)
High risk or very high risk warning populated when attempting to refill medication. RX request sent to PCP for review and approval if warranted.   

## 2019-03-30 ENCOUNTER — Ambulatory Visit: Payer: PPO | Admitting: Podiatry

## 2019-03-30 ENCOUNTER — Other Ambulatory Visit: Payer: Self-pay

## 2019-03-30 DIAGNOSIS — L84 Corns and callosities: Secondary | ICD-10-CM

## 2019-03-30 DIAGNOSIS — M792 Neuralgia and neuritis, unspecified: Secondary | ICD-10-CM

## 2019-03-30 NOTE — Patient Instructions (Addendum)
AVOID USING MEDICATED CORN/CALLUS REMOVERS. THEY HAVE ACID WHICH CAN CAUSE A WOUND ON YOUR FEET.   Corns and Calluses Corns are small areas of thickened skin that occur on the top, sides, or tip of a toe. They contain a cone-shaped core with a point that can press on a nerve below. This causes pain.  Calluses are areas of thickened skin that can occur anywhere on the body, including the hands, fingers, palms, soles of the feet, and heels. Calluses are usually larger than corns. What are the causes? Corns and calluses are caused by rubbing (friction) or pressure, such as from shoes that are too tight or do not fit properly. What increases the risk? Corns are more likely to develop in people who have misshapen toes (toe deformities), such as hammer toes. Calluses can occur with friction to any area of the skin. They are more likely to develop in people who:  Work with their hands.  Wear shoes that fit poorly, are too tight, or are high-heeled.  Have toe deformities. What are the signs or symptoms? Symptoms of a corn or callus include:  A hard growth on the skin.  Pain or tenderness under the skin.  Redness and swelling.  Increased discomfort while wearing tight-fitting shoes, if your feet are affected. If a corn or callus becomes infected, symptoms may include:  Redness and swelling that gets worse.  Pain.  Fluid, blood, or pus draining from the corn or callus. How is this diagnosed? Corns and calluses may be diagnosed based on your symptoms, your medical history, and a physical exam. How is this treated? Treatment for corns and calluses may include:  Removing the cause of the friction or pressure. This may involve: ? Changing your shoes. ? Wearing shoe inserts (orthotics) or other protective layers in your shoes, such as a corn pad. ? Wearing gloves.  Applying medicine to the skin (topical medicine) to help soften skin in the hardened, thickened areas.  Removing layers  of dead skin with a file to reduce the size of the corn or callus.  Removing the corn or callus with a scalpel or laser.  Taking antibiotic medicines, if your corn or callus is infected.  Having surgery, if a toe deformity is the cause. Follow these instructions at home:   Take over-the-counter and prescription medicines only as told by your health care provider.  If you were prescribed an antibiotic, take it as told by your health care provider. Do not stop taking it even if your condition starts to improve.  Wear shoes that fit well. Avoid wearing high-heeled shoes and shoes that are too tight or too loose.  Wear any padding, protective layers, gloves, or orthotics as told by your health care provider.  Soak your hands or feet and then use a file or pumice stone to soften your corn or callus. Do this as told by your health care provider.  Check your corn or callus every day for symptoms of infection. Contact a health care provider if you:  Notice that your symptoms do not improve with treatment.  Have redness or swelling that gets worse.  Notice that your corn or callus becomes painful.  Have fluid, blood, or pus coming from your corn or callus.  Have new symptoms. Summary  Corns are small areas of thickened skin that occur on the top, sides, or tip of a toe.  Calluses are areas of thickened skin that can occur anywhere on the body, including the hands, fingers, palms,  and soles of the feet. Calluses are usually larger than corns.  Corns and calluses are caused by rubbing (friction) or pressure, such as from shoes that are too tight or do not fit properly.  Treatment may include wearing any padding, protective layers, gloves, or orthotics as told by your health care provider. This information is not intended to replace advice given to you by your health care provider. Make sure you discuss any questions you have with your health care provider. Document Released: 02/03/2004  Document Revised: 08/19/2018 Document Reviewed: 03/12/2017 Elsevier Patient Education  2020 [employer].

## 2019-03-31 MED ORDER — NONFORMULARY OR COMPOUNDED ITEM: 3 refills | Status: DC

## 2019-04-06 ENCOUNTER — Encounter: Payer: Self-pay | Admitting: Family

## 2019-04-06 ENCOUNTER — Ambulatory Visit (INDEPENDENT_AMBULATORY_CARE_PROVIDER_SITE_OTHER): Payer: PPO | Admitting: Family

## 2019-04-06 ENCOUNTER — Other Ambulatory Visit: Payer: Self-pay

## 2019-04-06 DIAGNOSIS — J45909 Unspecified asthma, uncomplicated: Secondary | ICD-10-CM | POA: Diagnosis not present

## 2019-04-06 MED ORDER — BENZONATATE 100 MG PO CAPS: 100.0000 mg | ORAL_CAPSULE | Freq: Two times a day (BID) | ORAL | 0 refills | Status: DC | PRN

## 2019-04-06 NOTE — Progress Notes (Addendum)
This service is provided via telemedicine  No vital signs collected/recorded due to the encounter was a telemedicine visit.   Location of patient (ex: home, work):  Home   Patient consents to a telephone visit:  Yes  Location of the provider (ex: office, home): Office   Name of any referring provider: Sherrie Mustache, NP   Names of all persons participating in the telemedicine service and their role in the encounter: Marlowe Sax, NP, Ruthell Rummage CMA, and Lujean Rave   Time spent on call:  Ruthell Rummage CMA spent 18 minutes on phone with patient   Provider:   FNP-C  Lauree Chandler, NP  Patient Care Team: Lauree Chandler, NP as PCP - General (Nurse Practitioner)  Extended Emergency Contact Information Primary Emergency Contact: Ronney Asters, [place] Phone: [phone] Relation: Relative Secondary Emergency Contact: Jerald Kief Address: [place] [address] Johnnette Litter of [place] Phone: [phone] Work Phone: [phone] Relation: Son  Code Status:  DNR Goals of care: Advanced Directive information Advanced Directives 02/17/2019  Does Patient Have a Medical Advance Directive? Yes  Type of Advance Directive Out of facility DNR (pink MOST or yellow form)  Does patient want to make changes to medical advance directive? No - Patient declined  Would patient like information on creating a medical advance directive? -  Pre-existing out of facility DNR order (yellow form or pink MOST form) Yellow form placed in chart (order not valid for inpatient use)     Chief Complaint  Patient presents with  . Acute Visit    Patient c/o of cough that is keeping her up at night. Would like some tessalon perles. Patient states she has chronic bronchitis, allergies, and acid reflux. Patient states it started getting worse a month ago.   . Medication Management    Patient has  duplicate orders for amlodipine on medication list please verify correct dosage unable to find documentation of why dosage was changed     HPI:  Pt is a 78 y.o. female seen today for an acute visit for evaluation of cough that is keeping her up at night.she states has issues with chronic bronchitis,chronic nasal drainage acid reflux.Patient states it started getting worse a month ago. state has cut down on drinking coffee and soft drinks.Nexium twice daily has helped.   chronic Bronchitis - wheezes at times when lying down and worst when getting her groceries into her apartment from shopping.continues to use her Advair and Albuterol inhaler.she has required her albuterol once a day but states not daily. She request refill for tessalon perles since cough is keeping her up at night.she has used Tessalon pearl in the past which was effective.she denies any fever, chills edema on legs or shortness of breath.she has not been in contact with person sick with COVID-19 that she is aware of.   Past Medical History:  Diagnosis Date  . ALLERGIC RHINITIS   . Anxiety   . Asthma   . Blood type O+   . Cervical strain, acute   . Cirrhosis ([place])   . Colon polyp 2010   adenoma  . Diverticulosis of colon   . Dizziness   . Esophageal varices ([place])   . Fibromyalgia   . GERD (gastroesophageal reflux disease)   . History of nephrolithiasis   . Hypercholesterolemia    borderline  . Hypertension   .  IBS (irritable bowel syndrome)   . Osteoarthritis   . Personal history of allergy to unspecified medicinal agent   . Postconcussion syndrome   . Vitamin D deficiency    Past Surgical History:  Procedure Laterality Date  . CHOLECYSTECTOMY, LAPAROSCOPIC  2004   for gallstone pancreatitis  . KNEE SURGERY Right   . KNEE SURGERY Left   . THYROID SURGERY      Allergies  Allergen Reactions  . Ace Inhibitors     cough  . Amoxicillin Itching    REACTION: unknown? pain in right kidney  . Benicar Hct  [Olmesartan Medoxomil-Hctz]     Extreme weakness  . Budesonide-Formoterol Fumarate     Causes tremors and numbness  . Chlorzoxazone [Chlorzoxazone]   . Citalopram Hydrobromide     REACTION: hives  . Citalopram Hydrobromide   . Clonidine Derivatives   . Droperidol     REACTION: hives  . Flexeril [Cyclobenzaprine Hcl]   . Fluconazole Nausea And Vomiting and Other (See Comments)    fatigue  . Ketorolac Tromethamine     REACTION: hives  . Ketorolac Tromethamine   . Metoclopramide Hcl   . Mometasone Furo-Formoterol Fum     Causes sore throat, blurred vision and unable to sleep--started on med 09-17-10  . Morphine     REACTION: hives and itching  . Olmesartan Medoxomil     REACTION: fatique  . Breo Ellipta [Fluticasone Furoate-Vilanterol] Itching    Pt reports itching with Memory Dance is related to being lactose intolerant.     Outpatient Encounter Medications as of 04/06/2019  Medication Sig  . acetaminophen (TYLENOL) 500 MG tablet Take 1,000 mg by mouth 2 (two) times a day.  Marland Kitchen ADVAIR HFA 115-21 MCG/ACT inhaler TAKE 2 PUFFS BY MOUTH TWICE A DAY  . amLODipine (NORVASC) 2.5 MG tablet Take 2.5 mg by mouth daily.  . calcium carbonate (TUMS - DOSED IN MG ELEMENTAL CALCIUM) 500 MG chewable tablet Chew 1 tablet by mouth as needed for indigestion or heartburn.  . Calcium Carbonate-Vitamin D (CALCIUM 600+D) 600-400 MG-UNIT tablet Take 1 tablet by mouth 2 (two) times daily.  . Cetirizine HCl (ZYRTEC ALLERGY) 10 MG CAPS Take 10 mg by mouth daily.   . Cholecalciferol (VITAMIN D3) 1000 UNITS CAPS Take 1,000 Units by mouth daily.   Marland Kitchen esomeprazole (NEXIUM) 40 MG capsule Take 1 capsule (40 mg total) by mouth 2 (two) times daily before a meal.  . hydrochlorothiazide (HYDRODIURIL) 25 MG tablet Take 1 tablet (25 mg total) by mouth daily.  Marland Kitchen KLOR-CON M20 20 MEQ tablet TAKE 1 TABLET BY MOUTH EVERY DAY  . losartan (COZAAR) 25 MG tablet TAKE 1 TABLET BY MOUTH EVERY DAY  . meclizine (ANTIVERT) 25 MG tablet TAKE 1  TABLET 3 TIMES A DAY AS NEEDED FOR DIZZINESS/NAUSEA  . Multiple Vitamins-Minerals (CENTRUM SILVER PO) Take 1 tablet by mouth daily.   . NONFORMULARY OR COMPOUNDED ITEM Peripheral Neuropathy Cream: Bupivacaine 1%, Doxepin 3%, Gabapentin 6%, Pentoxifylline 3%, Topiramate 1% Order [facility] to [facility]  . Omega-3 Fatty Acids (FISH OIL MAXIMUM STRENGTH) 1200 MG CAPS Take 1,200 mg by mouth 2 (two) times daily.   Marland Kitchen PROAIR HFA 108 (90 Base) MCG/ACT inhaler INHALE 2 PUFFS EVERY 4 HOURS AS NEEDED INTO THE LUNGS FOR WHEEZING OR SHORTNESS OF BREATH  . propranolol (INDERAL) 10 MG tablet TAKE 1 TABLET BY MOUTH TWICE A DAY  . Propylene Glycol (SYSTANE BALANCE) 0.6 % SOLN Place 1 drop into both ears 2 (two) times daily.  Marland Kitchen  SALINE NASAL SPRAY NA Place 1 spray into both nostrils 2 (two) times daily.   Marland Kitchen amLODipine (NORVASC) 5 MG tablet Take 1 tablet (5 mg total) by mouth daily. (Patient not taking: Reported on 04/06/2019)   No facility-administered encounter medications on file as of 04/06/2019.     Review of Systems  Constitutional: Negative for appetite change, chills, fatigue and fever.  HENT: Positive for postnasal drip. Negative for congestion, sinus pressure, sinus pain, sneezing, sore throat and trouble swallowing.   Eyes: Negative for pain, discharge, redness and itching.  Respiratory: Positive for cough. Negative for chest tightness and shortness of breath.        Wheezing worst with lying down or when bring groceries in   Cardiovascular: Negative for chest pain, palpitations and leg swelling.  Gastrointestinal: Negative for abdominal distention, abdominal pain, constipation, diarrhea, nausea and vomiting.       Acid reflux   Skin: Negative for color change, pallor and rash.  Neurological: Negative for dizziness, weakness, light-headedness, numbness and headaches.  Hematological: Does not bruise/bleed easily.  Psychiatric/Behavioral: Negative for agitation. The patient is not  nervous/anxious.        Cough keeping her awake at night     Immunization History  Administered Date(s) Administered  . Hepatitis A, Adult 10/15/2017, 04/17/2018  . Influenza Split 02/11/2011, 02/25/2012  . Influenza Whole 02/10/2009, 02/26/2010  . Influenza, High Dose Seasonal PF 04/24/2017, 01/26/2018, 01/26/2018, 01/03/2019  . Influenza,inj,Quad PF,6+ Mos 02/09/2014, 03/20/2015, 02/27/2016  . Influenza-Unspecified 03/13/2013, 03/13/2017  . Pneumococcal Conjugate-13 02/27/2016  . Pneumococcal Polysaccharide-23 08/25/2014  . Tdap 12/06/2013  . Zoster Recombinat (Shingrix) 01/13/2019   Pertinent  Health Maintenance Due  Topic Date Due  . COLONOSCOPY  06/25/2020  . INFLUENZA VACCINE  Completed  . DEXA SCAN  Completed  . PNA vac Low Risk Adult  Completed   Fall Risk  04/06/2019 02/17/2019 10/15/2018 09/24/2018 09/14/2018  Falls in the past year? 0 0 0 0 0  Number falls in past yr: 0 0 0 0 0  Injury with Fall? 0 0 0 0 0  Follow up - - - - -   There were no vitals filed for this visit. There is no height or weight on file to calculate BMI. Physical Exam Unable to complete on Telephone visit.   Labs reviewed: Recent Labs    06/25/18 0943 09/02/18 0218 02/02/19 1122  NA 143 137 138  K 4.4 3.9 4.1  CL 103 101 99  CO2 32 23 31  GLUCOSE 110* 143* 114*  BUN 20 17 16   CREATININE 0.75 0.80 0.66  CALCIUM 9.9 9.6 10.0   Recent Labs    06/25/18 0943 09/02/18 0218 02/02/19 1122  AST 69* 56* 99*  ALT 91* 66* 99*  ALKPHOS 89 123  --   BILITOT 0.8 0.8 0.8  PROT 6.8 6.3* 6.7  ALBUMIN 4.4 3.6  --    Recent Labs    06/18/18 0947 09/02/18 0218 02/02/19 1122  WBC 8.7 9.2 7.6  NEUTROABS 4,411 3.7 3,063  HGB 15.3 14.7 14.5  HCT 45.5* 44.1 42.2  MCV 86.5 87.5 86.5  PLT 311 268 296   Lab Results  Component Value Date   TSH 1.69 02/02/2019   Lab Results  Component Value Date   HGBA1C 6.2 (H) 02/02/2019   Lab Results  Component Value Date   CHOL 201 (H) 02/02/2019    HDL 46 (L) 02/02/2019   LDLCALC 118 (H) 02/02/2019   LDLDIRECT 102.5 07/02/2012  TRIG 259 (H) 02/02/2019   CHOLHDL 4.4 02/02/2019    Significant Diagnostic Results in last 30 days:  No results found.  Assessment/Plan 1. Asthmatic bronchitis without complication, unspecified asthma severity, unspecified whether persistent Afebrile.cough keeping her awake at night.suspect her cough could be multifactorial from her asthma,allrgies and GERD.continue on Advair and Albuterol. - benzonatate (TESSALON) 100 MG capsule; Take 1 capsule (100 mg total) by mouth 2 (two) times daily as needed for cough.  Dispense: 20 capsule; Refill: 0 patient advised risk for dependent on tessalon capsule verbalized understanding. Instructed to notify provider if symptoms not relieved or resolved.   2. GERD Continue on current medication and dietary modification.   3. Allergies  Continue on Zyrtec 10 mg capsule daily.  Family/ staff Communication: Reviewed plan of care with patient.  Labs/tests ordered: None Spent 12 minutes of non-face to face with patient    Sandrea Hughs, NP

## 2019-04-06 NOTE — Telephone Encounter (Signed)
This encounter was created in error - please disregard.

## 2019-04-07 ENCOUNTER — Encounter: Payer: Self-pay | Admitting: Podiatry

## 2019-04-07 NOTE — Progress Notes (Signed)
Subjective: [name] presents today with history of diabetic neuropathy. Patient seen for follow up of chronic callosities b/l which interfere with daily activities and routine tasks.  Pain is aggravated when weightbearing with and without shoe gear. Pain is getting progressively worse and relieved with periodic professional debridement.   She would like to talk about treatment for her neuropathic pain.  Current Outpatient Medications on File Prior to Visit  Medication Sig Dispense Refill  . acetaminophen (TYLENOL) 500 MG tablet Take 1,000 mg by mouth 2 (two) times a day.    Marland Kitchen ADVAIR HFA 115-21 MCG/ACT inhaler TAKE 2 PUFFS BY MOUTH TWICE A DAY 12 g 5  . amLODipine (NORVASC) 2.5 MG tablet Take 2.5 mg by mouth daily.    Marland Kitchen amLODipine (NORVASC) 5 MG tablet Take 1 tablet (5 mg total) by mouth daily. 30 tablet 2  . calcium carbonate (TUMS - DOSED IN MG ELEMENTAL CALCIUM) 500 MG chewable tablet Chew 1 tablet by mouth as needed for indigestion or heartburn.    . Calcium Carbonate-Vitamin D (CALCIUM 600+D) 600-400 MG-UNIT tablet Take 1 tablet by mouth 2 (two) times daily.    . Cetirizine HCl (ZYRTEC ALLERGY) 10 MG CAPS Take 10 mg by mouth daily.     . Cholecalciferol (VITAMIN D3) 1000 UNITS CAPS Take 1,000 Units by mouth daily.     Marland Kitchen esomeprazole (NEXIUM) 40 MG capsule Take 1 capsule (40 mg total) by mouth 2 (two) times daily before a meal. 60 capsule 5  . hydrochlorothiazide (HYDRODIURIL) 25 MG tablet Take 1 tablet (25 mg total) by mouth daily. 90 tablet 3  . KLOR-CON M20 20 MEQ tablet TAKE 1 TABLET BY MOUTH EVERY DAY 90 tablet 1  . losartan (COZAAR) 25 MG tablet TAKE 1 TABLET BY MOUTH EVERY DAY 30 tablet 5  . meclizine (ANTIVERT) 25 MG tablet TAKE 1 TABLET 3 TIMES A DAY AS NEEDED FOR DIZZINESS/NAUSEA 90 tablet 0  . Multiple Vitamins-Minerals (CENTRUM SILVER PO) Take 1 tablet by mouth daily.     . Omega-3 Fatty Acids (FISH OIL MAXIMUM STRENGTH) 1200 MG CAPS Take 1,200 mg by mouth 2 (two) times  daily.     Marland Kitchen PROAIR HFA 108 (90 Base) MCG/ACT inhaler INHALE 2 PUFFS EVERY 4 HOURS AS NEEDED INTO THE LUNGS FOR WHEEZING OR SHORTNESS OF BREATH 25.5 Inhaler 1  . propranolol (INDERAL) 10 MG tablet TAKE 1 TABLET BY MOUTH TWICE A DAY 180 tablet 1  . Propylene Glycol (SYSTANE BALANCE) 0.6 % SOLN Place 1 drop into both ears 2 (two) times daily. 15 mL 0  . SALINE NASAL SPRAY NA Place 1 spray into both nostrils 2 (two) times daily.      No current facility-administered medications on file prior to visit.     Allergies  Allergen Reactions  . Ace Inhibitors     cough  . Amoxicillin Itching    REACTION: unknown? pain in right kidney  . Benicar Hct [Olmesartan Medoxomil-Hctz]     Extreme weakness  . Budesonide-Formoterol Fumarate     Causes tremors and numbness  . Chlorzoxazone [Chlorzoxazone]   . Citalopram Hydrobromide     REACTION: hives  . Citalopram Hydrobromide   . Clonidine Derivatives   . Droperidol     REACTION: hives  . Flexeril [Cyclobenzaprine Hcl]   . Fluconazole Nausea And Vomiting and Other (See Comments)    fatigue  . Ketorolac Tromethamine     REACTION: hives  . Ketorolac Tromethamine   . Metoclopramide Hcl   .  Mometasone Furo-Formoterol Fum     Causes sore throat, blurred vision and unable to sleep--started on med 09-17-10  . Morphine     REACTION: hives and itching  . Olmesartan Medoxomil     REACTION: fatique  . Breo Ellipta [Fluticasone Furoate-Vilanterol] Itching    Pt reports itching with Memory Dance is related to being lactose intolerant.     Objective: There were no vitals filed for this visit.  Vascular Examination: Capillary refill time <3 seconds x 10 digits.  Dorsalis pedis pulses present b/l.  Posterior tibial pulses present b/l.  Digital hair absent b/l.  Skin temperature WNL b/l.  Dermatological Examination: Skin with normal turgor, texture and tone b/l.  Hyperkeratotic lesions submet head 3 left and submet head 2 right foot. No erythema, no  edema, no drainage, no flocculence noted.   Musculoskeletal: Muscle strength 5/5 to all LE muscle groups.  Neurological: Sensation intact b/l with 10 gram monofilament.  Assessment: 1. Calluses submet head 3 left and submet head 2 right foot 2. Neuropathic pain  Plan: 1. Discussed neuropathic pain and treatment options available. A prescription was sent to [facility] for peripheral neuropathy cream which consists of: Bupivacaine 1%, doxepin 3%, gabapentin 6%, pentoxifylline 3%, and Topimarate 1%. Apply 1-2 grams to feet at 10 am, 4 pm and 10 pm daily.  2. Calluses pared submetatarsal head(s) utilizing sterile scalpel blade without incident. Corn(s) pared utilizing sterile scalpel blade without incident.  3. Patient to continue soft, supportive shoe gear daily. 4. Patient to report any pedal injuries to medical professional immediately. 5. Follow up 6 months per patient request. 6. Patient/POA to call should there be a concern in the interim.

## 2019-04-10 ENCOUNTER — Other Ambulatory Visit: Payer: Self-pay | Admitting: Nurse Practitioner

## 2019-04-22 ENCOUNTER — Telehealth: Payer: Self-pay | Admitting: *Deleted

## 2019-04-22 NOTE — Telephone Encounter (Signed)
Yes needs to make an appt to be evaluated

## 2019-04-22 NOTE — Telephone Encounter (Signed)
Pt is calling stating that she's having a lot of problems with neuropathy and asking for a prescription for gabapentin, I advised that she may need an appt for this but pt wanted me to send the message first.

## 2019-04-23 NOTE — Telephone Encounter (Signed)
Left message on VM with results, advised pt to call to schedule appt.

## 2019-04-29 ENCOUNTER — Ambulatory Visit (INDEPENDENT_AMBULATORY_CARE_PROVIDER_SITE_OTHER): Payer: PPO | Admitting: Adult Health

## 2019-04-29 ENCOUNTER — Encounter: Payer: Self-pay | Admitting: Adult Health

## 2019-04-29 ENCOUNTER — Other Ambulatory Visit: Payer: Self-pay

## 2019-04-29 DIAGNOSIS — M25552 Pain in left hip: Secondary | ICD-10-CM | POA: Diagnosis not present

## 2019-04-29 DIAGNOSIS — M25551 Pain in right hip: Secondary | ICD-10-CM

## 2019-04-29 NOTE — Patient Instructions (Signed)
  Continue Tylenol 500 mg 2 tabs = 1,000 mg twice a day as needed for pain  Hip Pain  The hip is the joint between the upper legs and the lower pelvis. The bones, cartilage, tendons, and muscles of your hip joint support your body and allow you to move around. Hip pain can range from a minor ache to severe pain in one or both of your hips. The pain may be felt on the inside of the hip joint near the groin, or the outside near the buttocks and upper thigh. You may also have swelling or stiffness. Follow these instructions at home: Managing pain, stiffness, and swelling  If directed, apply ice to the injured area. ? Put ice in a plastic bag. ? Place a towel between your skin and the bag. ? Leave the ice on for 20 minutes, 2-3 times a day  Sleep with a pillow between your legs on your most comfortable side.  Avoid any activities that cause pain. General instructions  Take over-the-counter and prescription medicines only as told by your health care provider.  Do any exercises as told by your health care provider.  Record the following: ? How often you have hip pain. ? The location of your pain. ? What the pain feels like. ? What makes the pain worse.  Keep all follow-up visits as told by your health care provider. This is important. Contact a health care provider if:  You cannot put weight on your leg.  Your pain or swelling continues or gets worse after one week.  It gets harder to walk.  You have a fever. Get help right away if:  You fall.  You have a sudden increase in pain and swelling in your hip.  Your hip is red or swollen or very tender to touch. Summary  Hip pain can range from a minor ache to severe pain in one or both of your hips.  The pain may be felt on the inside of the hip joint near the groin, or the outside near the buttocks and upper thigh.  Avoid any activities that cause pain.  Record how often you have hip pain, the location of the pain, what  makes it worse and what it feels like. This information is not intended to replace advice given to you by your health care provider. Make sure you discuss any questions you have with your health care provider. Document Released: 10/17/2009 Document Revised: 04/11/2017 Document Reviewed: 04/01/2016 Elsevier Patient Education  2020 [employer].

## 2019-04-29 NOTE — Progress Notes (Signed)
[facility]  Provider:  Durenda Age - DNP  Code Status: DNR  Goals of Care:  Advanced Directives 04/29/2019  Does Patient Have a Medical Advance Directive? Yes  Type of Advance Directive Out of facility DNR (pink MOST or yellow form)  Does patient want to make changes to medical advance directive? No - Patient declined  Would patient like information on creating a medical advance directive? -  Pre-existing out of facility DNR order (yellow form or pink MOST form) Yellow form placed in chart (order not valid for inpatient use)     Chief Complaint  Patient presents with  . Acute Visit    Severe Hip and Back Pain    HPI: Patient is a 78 y.o. female seen today for an acute visit for bilateral hip pain. She reported that her left hip started hurting last week, 9/10. She denies having a fall or twisted herself. She has used warm compress over her left hip and Tylenol as needed. Left hip pain has improved. She now complains of right hip pain, 9/10. She tried warm compress and Tylenol PRN but still has pain. She uses a walker and limps slightly. She reported having surgery on her left knee and two surgeries on her right knee. She has PMH of nonalcoholic liver cirrhosis and can only take Tylenol 2,000 mg/24 hour.    Past Medical History:  Diagnosis Date  . ALLERGIC RHINITIS   . Anxiety   . Asthma   . Blood type O+   . Cervical strain, acute   . Cirrhosis ([place])   . Colon polyp 2010   adenoma  . Diverticulosis of colon   . Dizziness   . Esophageal varices ([place])   . Fibromyalgia   . GERD (gastroesophageal reflux disease)   . History of nephrolithiasis   . Hypercholesterolemia    borderline  . Hypertension   . IBS (irritable bowel syndrome)   . Osteoarthritis   . Personal history of allergy to unspecified medicinal agent   . Postconcussion syndrome   . Vitamin D deficiency     Past Surgical History:  Procedure Laterality Date  . CHOLECYSTECTOMY, LAPAROSCOPIC  2004    for gallstone pancreatitis  . KNEE SURGERY Right   . KNEE SURGERY Left   . THYROID SURGERY      Allergies  Allergen Reactions  . Ace Inhibitors     cough  . Amoxicillin Itching    REACTION: unknown? pain in right kidney  . Benicar Hct [Olmesartan Medoxomil-Hctz]     Extreme weakness  . Budesonide-Formoterol Fumarate     Causes tremors and numbness  . Chlorzoxazone [Chlorzoxazone]   . Citalopram Hydrobromide     REACTION: hives  . Citalopram Hydrobromide   . Clonidine Derivatives   . Droperidol     REACTION: hives  . Flexeril [Cyclobenzaprine Hcl]   . Fluconazole Nausea And Vomiting and Other (See Comments)    fatigue  . Ketorolac Tromethamine     REACTION: hives  . Ketorolac Tromethamine   . Metoclopramide Hcl   . Mometasone Furo-Formoterol Fum     Causes sore throat, blurred vision and unable to sleep--started on med 09-17-10  . Morphine     REACTION: hives and itching  . Olmesartan Medoxomil     REACTION: fatique  . Breo Ellipta [Fluticasone Furoate-Vilanterol] Itching    Pt reports itching with Memory Dance is related to being lactose intolerant.     Outpatient Encounter Medications as of 04/29/2019  Medication Sig  .  acetaminophen (TYLENOL) 500 MG tablet Take 1,000 mg by mouth 2 (two) times a day.  Marland Kitchen ADVAIR HFA 115-21 MCG/ACT inhaler TAKE 2 PUFFS BY MOUTH TWICE A DAY  . amLODipine (NORVASC) 2.5 MG tablet Take 2.5 mg by mouth daily.  . benzonatate (TESSALON) 100 MG capsule Take 1 capsule (100 mg total) by mouth 2 (two) times daily as needed for cough.  . calcium carbonate (TUMS - DOSED IN MG ELEMENTAL CALCIUM) 500 MG chewable tablet Chew 1 tablet by mouth as needed for indigestion or heartburn.  . Calcium Carbonate-Vitamin D (CALCIUM 600+D) 600-400 MG-UNIT tablet Take 1 tablet by mouth 2 (two) times daily.  . Cetirizine HCl (ZYRTEC ALLERGY) 10 MG CAPS Take 10 mg by mouth daily.   . Cholecalciferol (VITAMIN D3) 1000 UNITS CAPS Take 1,000 Units by mouth daily.   Marland Kitchen  esomeprazole (NEXIUM) 40 MG capsule Take 1 capsule (40 mg total) by mouth 2 (two) times daily before a meal.  . hydrochlorothiazide (HYDRODIURIL) 25 MG tablet Take 1 tablet (25 mg total) by mouth daily.  Marland Kitchen KLOR-CON M20 20 MEQ tablet TAKE 1 TABLET BY MOUTH EVERY DAY  . losartan (COZAAR) 25 MG tablet TAKE 1 TABLET BY MOUTH EVERY DAY  . meclizine (ANTIVERT) 25 MG tablet TAKE 1 TABLET 3 TIMES A DAY AS NEEDED FOR DIZZINESS/NAUSEA  . Multiple Vitamins-Minerals (CENTRUM SILVER PO) Take 1 tablet by mouth daily.   . Omega-3 Fatty Acids (FISH OIL MAXIMUM STRENGTH) 1200 MG CAPS Take 1,200 mg by mouth 2 (two) times daily.   Marland Kitchen PROAIR HFA 108 (90 Base) MCG/ACT inhaler INHALE 2 PUFFS EVERY 4 HOURS AS NEEDED INTO THE LUNGS FOR WHEEZING OR SHORTNESS OF BREATH  . propranolol (INDERAL) 10 MG tablet TAKE 1 TABLET BY MOUTH TWICE A DAY  . Propylene Glycol (SYSTANE BALANCE) 0.6 % SOLN Place 1 drop into both ears 2 (two) times daily.  Marland Kitchen SALINE NASAL SPRAY NA Place 1 spray into both nostrils 2 (two) times daily.   Marland Kitchen amLODipine (NORVASC) 5 MG tablet Take 1 tablet (5 mg total) by mouth daily. (Patient not taking: Reported on 04/29/2019)  . NONFORMULARY OR COMPOUNDED ITEM Peripheral Neuropathy Cream: Bupivacaine 1%, Doxepin 3%, Gabapentin 6%, Pentoxifylline 3%, Topiramate 1% Order faxed to [place] (Patient not taking: Reported on 04/29/2019)   No facility-administered encounter medications on file as of 04/29/2019.    Review of Systems:  Review of Systems  Eyes: Negative.   Respiratory: Negative for cough and shortness of breath.   Cardiovascular: Negative for leg swelling.  Endocrine: Negative.   Genitourinary: Negative.   Musculoskeletal: Positive for arthralgias. Negative for joint swelling.  Skin: Negative for color change, rash and wound.  Allergic/Immunologic: Negative.   Neurological: Negative for dizziness, numbness and headaches.  Psychiatric/Behavioral: Negative.     Health Maintenance   Topic Date Due  . COLONOSCOPY  06/25/2020  . TETANUS/TDAP  12/07/2023  . INFLUENZA VACCINE  Completed  . DEXA SCAN  Completed  . PNA vac Low Risk Adult  Completed    Physical Exam: Vitals:   04/29/19 1423  BP: 130/80  Pulse: (!) 58  Resp: 20  Temp: (!) 97.1 F (36.2 C)  TempSrc: Oral  SpO2: 95%  Weight: 188 lb (85.3 kg)  Height: 5' 3"  (1.6 m)   Body mass index is 33.3 kg/m. Physical Exam Constitutional:      Appearance: She is obese.  HENT:     Head: Normocephalic.     Mouth/Throat:     Mouth: Mucous  membranes are moist.     Pharynx: Oropharynx is clear.  Cardiovascular:     Rate and Rhythm: Normal rate and regular rhythm.  Pulmonary:     Effort: Pulmonary effort is normal. No respiratory distress.     Breath sounds: Normal breath sounds. No wheezing.  Abdominal:     General: Bowel sounds are normal.     Palpations: Abdomen is soft.  Musculoskeletal:        General: Tenderness present. No swelling.     Cervical back: Normal range of motion.     Comments: Bilateral hip pain  Neurological:     General: No focal deficit present.     Mental Status: She is alert and oriented to person, place, and time.  Psychiatric:        Mood and Affect: Mood normal.        Behavior: Behavior normal.        Thought Content: Thought content normal.     Labs reviewed: Basic Metabolic Panel: Recent Labs    06/25/18 0943 09/02/18 0218 02/02/19 1122  NA 143 137 138  K 4.4 3.9 4.1  CL 103 101 99  CO2 32 23 31  GLUCOSE 110* 143* 114*  BUN 20 17 16   CREATININE 0.75 0.80 0.66  CALCIUM 9.9 9.6 10.0  TSH  --   --  1.69   Liver Function Tests: Recent Labs    06/25/18 0943 09/02/18 0218 02/02/19 1122  AST 69* 56* 99*  ALT 91* 66* 99*  ALKPHOS 89 123  --   BILITOT 0.8 0.8 0.8  PROT 6.8 6.3* 6.7  ALBUMIN 4.4 3.6  --    CBC: Recent Labs    06/18/18 0947 09/02/18 0218 02/02/19 1122  WBC 8.7 9.2 7.6  NEUTROABS 4,411 3.7 3,063  HGB 15.3 14.7 14.5  HCT 45.5* 44.1  42.2  MCV 86.5 87.5 86.5  PLT 311 268 296   Lipid Panel: Recent Labs    06/18/18 0947 02/02/19 1122  CHOL 201* 201*  HDL 46* 46*  LDLCALC 118* 118*  TRIG 261* 259*  CHOLHDL 4.4 4.4   Lab Results  Component Value Date   HGBA1C 6.2 (H) 02/02/2019     Assessment/Plan  1. Bilateral hip pain - continue Tylenol 500 mg 2 tabs = 1,000 mg twice a day as needed for pain - DG Hip Unilat W OR W/O Pelvis 2-3 Views Left; Future - instructed to move cautiously to prevent falls - plan is for possible referral to orthopedics and possibly get steroid injection if needed for pain management  Labs/tests ordered:  Bilateral hip x-ray   Next appt:  05/21/2019

## 2019-04-30 ENCOUNTER — Other Ambulatory Visit: Payer: Self-pay | Admitting: Adult Health

## 2019-04-30 ENCOUNTER — Ambulatory Visit
Admission: RE | Admit: 2019-04-30 | Discharge: 2019-04-30 | Disposition: A | Discharge status: Home / Self Care | Payer: PPO | Source: Ambulatory Visit | Attending: Adult Health | Admitting: Adult Health

## 2019-04-30 DIAGNOSIS — M25551 Pain in right hip: Secondary | ICD-10-CM | POA: Diagnosis not present

## 2019-04-30 DIAGNOSIS — M25552 Pain in left hip: Secondary | ICD-10-CM | POA: Diagnosis not present

## 2019-04-30 NOTE — Addendum Note (Signed)
Addended by: Bonney Leitz T on: 04/30/2019 11:13 AM   Modules accepted: Orders

## 2019-04-30 NOTE — Addendum Note (Signed)
Addended by: Bonney Leitz T on: 04/30/2019 11:11 AM   Modules accepted: Orders

## 2019-04-30 NOTE — Addendum Note (Signed)
Addended by: Durenda Age C on: 04/30/2019 09:40 AM   Modules accepted: Level of Service

## 2019-05-10 ENCOUNTER — Other Ambulatory Visit: Payer: Self-pay | Admitting: Gastroenterology

## 2019-05-20 ENCOUNTER — Other Ambulatory Visit: Payer: Self-pay | Admitting: Gastroenterology

## 2019-05-20 ENCOUNTER — Telehealth: Payer: Self-pay | Admitting: Gastroenterology

## 2019-05-20 DIAGNOSIS — K219 Gastro-esophageal reflux disease without esophagitis: Secondary | ICD-10-CM

## 2019-05-21 ENCOUNTER — Ambulatory Visit: Payer: PPO | Admitting: Nurse Practitioner

## 2019-05-21 ENCOUNTER — Other Ambulatory Visit: Payer: Self-pay | Admitting: Nurse Practitioner

## 2019-05-21 DIAGNOSIS — I10 Essential (primary) hypertension: Secondary | ICD-10-CM

## 2019-05-21 NOTE — Telephone Encounter (Signed)
Lm for pt that we sent Nexium  #60 with 1 refill yesterday to [facility] [address].  Also Asked her to call and schedule an appt with [facility] for a yearly OV.  Last seen 02-2018

## 2019-06-03 ENCOUNTER — Ambulatory Visit: Payer: PPO | Attending: Internal Medicine

## 2019-06-03 DIAGNOSIS — Z23 Encounter for immunization: Secondary | ICD-10-CM | POA: Insufficient documentation

## 2019-06-03 NOTE — Progress Notes (Signed)
   Covid-19 Vaccination Clinic  Name:  [name]    MRN: [id number] DOB: [birth year]  06/03/2019  [name] was observed post Covid-19 immunization for 15 minutes without incidence. She was provided with Vaccine Information Sheet and instruction to access the V-Safe system.   [name] was instructed to call 911 with any severe reactions post vaccine: Marland Kitchen Difficulty breathing  . Swelling of your face and throat  . A fast heartbeat  . A bad rash all over your body  . Dizziness and weakness    Immunizations Administered    Name Date Dose VIS Date Route   Pfizer COVID-19 Vaccine 06/03/2019  5:46 PM 0.3 mL 04/23/2019 Intramuscular   Manufacturer: [place]   Lot: [id number]   [place]: [id number]

## 2019-06-06 ENCOUNTER — Other Ambulatory Visit: Payer: Self-pay | Admitting: Gastroenterology

## 2019-06-15 ENCOUNTER — Other Ambulatory Visit: Payer: Self-pay | Admitting: Gastroenterology

## 2019-06-15 DIAGNOSIS — K219 Gastro-esophageal reflux disease without esophagitis: Secondary | ICD-10-CM

## 2019-06-22 ENCOUNTER — Ambulatory Visit: Payer: PPO | Attending: Internal Medicine

## 2019-06-22 DIAGNOSIS — Z23 Encounter for immunization: Secondary | ICD-10-CM

## 2019-06-22 NOTE — Progress Notes (Signed)
   Covid-19 Vaccination Clinic  Name:  [name]    MRN: [id number] DOB: 1941-02-06  06/22/2019  [name] was observed post Covid-19 immunization for 15 minutes without incidence. She was provided with Vaccine Information Sheet and instruction to access the V-Safe system.   [name] was instructed to call 911 with any severe reactions post vaccine: Marland Kitchen Difficulty breathing  . Swelling of your face and throat  . A fast heartbeat  . A bad rash all over your body  . Dizziness and weakness    Immunizations Administered    Name Date Dose VIS Date Route   Pfizer COVID-19 Vaccine 06/22/2019 11:10 AM 0.3 mL 04/23/2019 Intramuscular   Manufacturer: [employer]   Lot: [id number]   [place]: [id number]

## 2019-06-23 ENCOUNTER — Ambulatory Visit (INDEPENDENT_AMBULATORY_CARE_PROVIDER_SITE_OTHER): Payer: PPO | Admitting: Gastroenterology

## 2019-06-23 ENCOUNTER — Encounter: Payer: Self-pay | Admitting: Gastroenterology

## 2019-06-23 VITALS — Ht 61.0 in | Wt 171.0 lb

## 2019-06-23 DIAGNOSIS — K7460 Unspecified cirrhosis of liver: Secondary | ICD-10-CM | POA: Diagnosis not present

## 2019-06-23 DIAGNOSIS — K219 Gastro-esophageal reflux disease without esophagitis: Secondary | ICD-10-CM | POA: Diagnosis not present

## 2019-06-23 MED ORDER — ESOMEPRAZOLE MAGNESIUM 40 MG PO CPDR: DELAYED_RELEASE_CAPSULE | ORAL | 1 refills | Status: DC

## 2019-06-23 MED ORDER — PROPRANOLOL HCL 10 MG PO TABS: 10.0000 mg | ORAL_TABLET | Freq: Two times a day (BID) | ORAL | 2 refills | Status: DC

## 2019-06-23 NOTE — Patient Instructions (Addendum)
If you are age 81 or older, your body mass index should be between 23-30. Your Body mass index is 32.31 kg/m. If this is out of the aforementioned range listed, please consider follow up with your Primary Care Provider.  You have been scheduled for an abdominal ultrasound at [facility]iology (1st floor of hospital) on Tuesday, 06-29-19 at 9:30am. Please arrive 15 minutes prior to your appointment for registration. Make certain not to have anything to eat or drink 6 hours prior to your appointment. Should you need to reschedule your appointment, please contact radiology at [phone]. This test typically takes about 30 minutes to perform.  Please go to the lab in the basement of our building to have lab work done as you leave today. Hit "B" for basement when you get on the elevator.  When the doors open the lab is on your left.  We will call you with the results. Thank you.   We have sent the following medications to your pharmacy for you to pick up at your convenience: Nexium 61m: Take once OR twice daily as needed  Propranolol 124m Take twice a day  We would like to see you back in 6 months for a follow up visit.  Thank you for entrusting me with your care and [facility]   Due to recent changes in healthcare laws, you may see the results of your imaging and laboratory studies on MyChart before your provider has had a chance to review them.  We understand that in some cases there may be results that are confusing or concerning to you. Not all laboratory results come back in the same time frame and the provider may be waiting for multiple results in order to interpret others.  Please give us[place]orea8 hours in order for your provider to thoroughly review all the results before contacting the office for clarification of your results.

## 2019-06-23 NOTE — Progress Notes (Signed)
THIS ENCOUNTER IS A VIRTUAL VISIT DUE TO COVID-19 - PATIENT WAS NOT SEEN IN THE OFFICE. PATIENT HAS CONSENTED TO VIRTUAL VISIT / TELEMEDICINE VISIT. ONLY TELEPHONE WAS AVAILABLE TO COMMUNICATE WITH THIS PATIENT (PHONE COULD NOT SUPPORT VISUAL PLATFORM)   Location of patient: home Location of provider: office Persons participating: myself, patient Time spent on call: 15 minutes  HPI :  79 y/o female here for a follow up visit for the following:  Cirrhosis - compensated. Diagnosed in 06/2017 when she had incidental finding of esophageal varices on EGD done to evaluate history of possible Barrett's. Suspected to be due to NASH given negative workup otherwise.  She has been doing quite well since have last seen her.  She has not had any decompensations we are aware of.  She is tolerating propranolol at a low-dose, resting heart rate in the 50s to 60s.  No lower extremity edema or ascites she complains of.  She has been exercising routinely and intentionally has been working on weight loss.  She states over the past several months she has lost 36 pounds with increased exercise and watching her diet.  She is very happy about this.  She feels quite well without any complaints.  Her last ultrasound was done in February 2020. Immunized to hep A.  She just received her second dose of the Covid vaccine yesterday, she is feeling a little bit rundown today.  GERD / cough - she has a history of asthma and has had some persistent cough along with some wheezing the last time I saw her.  We gave her a trial of increasing Nexium to 40 mg twice a day since have last seen her, as she did have a lot of regurgitation and reflux symptoms as well.  She states this has worked extremely well for her and her reflux is very well controlled as well as her cough.  She also thinks that the reflux has been improved by the weight loss that she has had.  She asks about refills on long-term management of this.  Her endoscopy showed  no evidence of Barrett's esophagus on her last exam  Prior workup: [place] 06/25/18 - stable changes  EGD 06/25/2017 - No evidence of Barrett's, diminutive white plaques in the esophagus c/w candidiasis, suspected varices in mid esophagus, multiple gastric polyps, normal stomach / duodenum - fundic gland polyps  Colonoscopy 06/25/2017 - 9 small adenomas, afew small-mouthed diverticula were found in the sigmoid colon and ascending colon,Internal hemorrhoids were found during retroflexion. The hemorrhoids were moderate.  Colonoscopy 07/28/2008 - diverticulosis, one small adenoma, biopsies taken to rule out microscopic colitis and negative  EGD 9/209/2006 - 2cm segment of possible BE - bx show reflux changes only, no BE    Past Medical History:  Diagnosis Date  . ALLERGIC RHINITIS   . Anxiety   . Asthma   . Blood type O+   . Cervical strain, acute   . Cirrhosis ([place])   . Colon polyp 2010   adenoma  . Diverticulosis of colon   . Dizziness   . Esophageal varices ([place])   . Fibromyalgia   . GERD (gastroesophageal reflux disease)   . History of nephrolithiasis   . Hypercholesterolemia    borderline  . Hypertension   . IBS (irritable bowel syndrome)   . Osteoarthritis   . Personal history of allergy to unspecified medicinal agent   . Postconcussion syndrome   . Vitamin D deficiency      Past Surgical History:  Procedure Laterality Date  . CHOLECYSTECTOMY, LAPAROSCOPIC  2004   for gallstone pancreatitis  . KNEE SURGERY Right   . KNEE SURGERY Left   . THYROID SURGERY     Family History  Problem Relation Age of Onset  . Breast cancer Mother   . Alzheimer's disease Mother   . Heart disease Mother   . COPD Brother   . Heart disease Sister   . Breast cancer Sister 44  . Alcohol abuse Sister   . Ovarian cancer Paternal Grandmother   . Arthritis Other        Cousins   . COPD Cousin        Maternal side   . Heart attack Maternal Uncle    Social History   Tobacco Use    . Smoking status: Former Smoker    Packs/day: 2.00    Years: 8.00    Pack years: 16.00    Types: Cigarettes    Quit date: 05/13/1962    Years since quitting: 57.1  . Smokeless tobacco: Never Used  Substance Use Topics  . Alcohol use: No  . Drug use: No   Current Outpatient Medications  Medication Sig Dispense Refill  . acetaminophen (TYLENOL) 500 MG tablet Take 1,000 mg by mouth 2 (two) times a day.    Marland Kitchen ADVAIR HFA 115-21 MCG/ACT inhaler TAKE 2 PUFFS BY MOUTH TWICE A DAY 12 g 5  . amLODipine (NORVASC) 2.5 MG tablet TAKE 1 TABLET BY MOUTH EVERY DAY 90 tablet 1  . benzonatate (TESSALON) 100 MG capsule Take 1 capsule (100 mg total) by mouth 2 (two) times daily as needed for cough. 20 capsule 0  . calcium carbonate (TUMS - DOSED IN MG ELEMENTAL CALCIUM) 500 MG chewable tablet Chew 1 tablet by mouth as needed for indigestion or heartburn.    . Calcium Carbonate-Vitamin D (CALCIUM 600+D) 600-400 MG-UNIT tablet Take 1 tablet by mouth 2 (two) times daily.    . Cetirizine HCl (ZYRTEC ALLERGY) 10 MG CAPS Take 10 mg by mouth daily.     . Cholecalciferol (VITAMIN D3) 1000 UNITS CAPS Take 1,000 Units by mouth daily.     Marland Kitchen esomeprazole (NEXIUM) 40 MG capsule Take 1 capsule (40 mg total) by mouth 2 (two) times daily before a meal. Please schedule a yearly office visit for further refills: [phone] 60 capsule 1  . hydrochlorothiazide (HYDRODIURIL) 25 MG tablet Take 1 tablet (25 mg total) by mouth daily. 90 tablet 3  . KLOR-CON M20 20 MEQ tablet TAKE 1 TABLET BY MOUTH EVERY DAY 90 tablet 1  . losartan (COZAAR) 25 MG tablet TAKE 1 TABLET BY MOUTH EVERY DAY 30 tablet 5  . meclizine (ANTIVERT) 25 MG tablet TAKE 1 TABLET 3 TIMES A DAY AS NEEDED FOR DIZZINESS/NAUSEA 90 tablet 0  . Multiple Vitamins-Minerals (CENTRUM SILVER PO) Take 1 tablet by mouth daily.     . NONFORMULARY OR COMPOUNDED ITEM Peripheral Neuropathy Cream: Bupivacaine 1%, Doxepin 3%, Gabapentin 6%, Pentoxifylline 3%, Topiramate 1% Order  faxed to [place] 1 each 3  . Omega-3 Fatty Acids (FISH OIL MAXIMUM STRENGTH) 1200 MG CAPS Take 1,200 mg by mouth 2 (two) times daily.     Marland Kitchen PROAIR HFA 108 (90 Base) MCG/ACT inhaler INHALE 2 PUFFS EVERY 4 HOURS AS NEEDED INTO THE LUNGS FOR WHEEZING OR SHORTNESS OF BREATH 25.5 Inhaler 1  . propranolol (INDERAL) 10 MG tablet Take 1 tablet (10 mg total) by mouth 2 (two) times daily. Please schedule a Yearly Office Visit for  further refills. 60 tablet 1  . Propylene Glycol (SYSTANE BALANCE) 0.6 % SOLN Place 1 drop into both ears 2 (two) times daily. 15 mL 0  . SALINE NASAL SPRAY NA Place 1 spray into both nostrils 2 (two) times daily.      No current facility-administered medications for this visit.   Allergies  Allergen Reactions  . Ace Inhibitors     cough  . Amoxicillin Itching    REACTION: unknown? pain in right kidney  . Benicar Hct [Olmesartan Medoxomil-Hctz]     Extreme weakness  . Budesonide-Formoterol Fumarate     Causes tremors and numbness  . Chlorzoxazone [Chlorzoxazone]   . Citalopram Hydrobromide     REACTION: hives  . Citalopram Hydrobromide   . Clonidine Derivatives   . Droperidol     REACTION: hives  . Flexeril [Cyclobenzaprine Hcl]   . Fluconazole Nausea And Vomiting and Other (See Comments)    fatigue  . Ketorolac Tromethamine     REACTION: hives  . Ketorolac Tromethamine   . Metoclopramide Hcl   . Mometasone Furo-Formoterol Fum     Causes sore throat, blurred vision and unable to sleep--started on med 09-17-10  . Morphine     REACTION: hives and itching  . Olmesartan Medoxomil     REACTION: fatique  . Breo Ellipta [Fluticasone Furoate-Vilanterol] Itching    Pt reports itching with Memory Dance is related to being lactose intolerant.      Review of Systems: All systems reviewed and negative except where noted in HPI.   Lab Results  Component Value Date   WBC 7.6 02/02/2019   HGB 14.5 02/02/2019   HCT 42.2 02/02/2019   MCV 86.5 02/02/2019   PLT 296  02/02/2019    Lab Results  Component Value Date   CREATININE 0.66 02/02/2019   BUN 16 02/02/2019   NA 138 02/02/2019   K 4.1 02/02/2019   CL 99 02/02/2019   CO2 31 02/02/2019    Lab Results  Component Value Date   ALT 99 (H) 02/02/2019   AST 99 (H) 02/02/2019   ALKPHOS 123 09/02/2018   BILITOT 0.8 02/02/2019   Lab Results  Component Value Date   INR 0.9 09/02/2018   INR 0.9 12/18/2017   INR 1.0 07/11/2017     Physical Exam: Ht 5' 1"  (1.549 m) Comment: from a previous height  Wt 171 lb (77.6 kg) Comment: pt stated weight  BMI 32.31 kg/m  NA - phone visit   ASSESSMENT AND PLAN: 79 y/o female here for a follow up visit regarding:  Cirrhosis secondary to suspected NASH / esophageal varices -on propranolol and tolerating it well, she has had no decompensating events since her diagnosis.  We have discussed what cirrhosis is and long-term risks of decompensation and HCC, fortunately she has been doing quite well with this and not having any issues.  We will continue propranolol at her present dose given her low resting heart rate, no further surveillance EGD warranted in this light.  She is due for [facility] with another ultrasound and will coordinate that.  She is also due for some maintenance labs when she has a chance to get there.  She is avoiding NSAIDs and avoiding alcohol.  She has lost a significant amount of weight intentionally and hopefully that will help as well.  She is doing quite well currently and will continue to see me every 6 months for this issue.  She will contact me in the interim with any issues.  GERD -she has been doing much better on Nexium 40 mg twice a day since have last seen her, however she is also lost a significant amount of weight intentionally as above, this also has probably helped her reflux.  I discussed options with her long-term.  We discussed long-term risks of PPI use, recommend lowest dose needed to control her symptoms over time.   She was agreeable with this.  She will reduce to 40 mg once a day and see how she does over the next several weeks.  If she has worsening reflux on the lower dose she can increase to twice daily as needed.  Hopefully we can continue to wean the dose down over time.  She agreed  Total time spent on phone 15 minutes.  Menominee Cellar, MD [facility]

## 2019-06-29 ENCOUNTER — Other Ambulatory Visit: Payer: Self-pay

## 2019-06-29 ENCOUNTER — Ambulatory Visit (HOSPITAL_COMMUNITY)
Admission: RE | Admit: 2019-06-29 | Discharge: 2019-06-29 | Disposition: A | Discharge status: Home / Self Care | Payer: PPO | Source: Ambulatory Visit | Attending: Gastroenterology | Admitting: Gastroenterology

## 2019-06-29 DIAGNOSIS — K7460 Unspecified cirrhosis of liver: Secondary | ICD-10-CM

## 2019-06-29 DIAGNOSIS — K219 Gastro-esophageal reflux disease without esophagitis: Secondary | ICD-10-CM | POA: Diagnosis not present

## 2019-06-30 ENCOUNTER — Ambulatory Visit: Payer: PPO | Admitting: Nurse Practitioner

## 2019-07-07 ENCOUNTER — Ambulatory Visit (INDEPENDENT_AMBULATORY_CARE_PROVIDER_SITE_OTHER): Payer: PPO | Admitting: Nurse Practitioner

## 2019-07-07 ENCOUNTER — Encounter: Payer: Self-pay | Admitting: Nurse Practitioner

## 2019-07-07 ENCOUNTER — Other Ambulatory Visit: Payer: Self-pay

## 2019-07-07 VITALS — BP 132/74 | HR 78 | Temp 97.7°F | Ht 61.0 in | Wt 172.0 lb

## 2019-07-07 DIAGNOSIS — R7303 Prediabetes: Secondary | ICD-10-CM | POA: Diagnosis not present

## 2019-07-07 DIAGNOSIS — I10 Essential (primary) hypertension: Secondary | ICD-10-CM

## 2019-07-07 DIAGNOSIS — I8500 Esophageal varices without bleeding: Secondary | ICD-10-CM

## 2019-07-07 DIAGNOSIS — E785 Hyperlipidemia, unspecified: Secondary | ICD-10-CM

## 2019-07-07 DIAGNOSIS — J4520 Mild intermittent asthma, uncomplicated: Secondary | ICD-10-CM | POA: Diagnosis not present

## 2019-07-07 NOTE — Progress Notes (Deleted)
Careteam: Patient Care Team: Lauree Chandler, NP as PCP - General (Nurse Practitioner)  Advanced Directive information Does Patient Have a Medical Advance Directive?: Yes, Type of Advance Directive: Out of facility DNR (pink MOST or yellow form), Pre-existing out of facility DNR order (yellow form or pink MOST form): Yellow form placed in chart (order not valid for inpatient use), Does patient want to make changes to medical advance directive?: No - Patient declined  Allergies  Allergen Reactions  . Ace Inhibitors     cough  . Amoxicillin Itching    REACTION: unknown? pain in right kidney  . Benicar Hct [Olmesartan Medoxomil-Hctz]     Extreme weakness  . Budesonide-Formoterol Fumarate     Causes tremors and numbness  . Chlorzoxazone [Chlorzoxazone]   . Citalopram Hydrobromide     REACTION: hives  . Citalopram Hydrobromide   . Clonidine Derivatives   . Droperidol     REACTION: hives  . Flexeril [Cyclobenzaprine Hcl]   . Fluconazole Nausea And Vomiting and Other (See Comments)    fatigue  . Ketorolac Tromethamine     REACTION: hives  . Ketorolac Tromethamine   . Metoclopramide Hcl   . Mometasone Furo-Formoterol Fum     Causes sore throat, blurred vision and unable to sleep--started on med 09-17-10  . Morphine     REACTION: hives and itching  . Olmesartan Medoxomil     REACTION: fatique  . Breo Ellipta [Fluticasone Furoate-Vilanterol] Itching    Pt reports itching with Memory Dance is related to being lactose intolerant.     Chief Complaint  Patient presents with  . Medical Management of Chronic Issues    4 month follow-up   . Sleeping Problem    Patient c/o trouble falling asleep   . Medication Management    Clarify twice daily medication dosing, patient questions if it means every 12 hours      HPI: Patient is a 79 y.o. female ***  Review of Systems:  ROS***  Past Medical History:  Diagnosis Date  . ALLERGIC RHINITIS   . Anxiety   . Asthma   . Blood type O+    . Cervical strain, acute   . Cirrhosis ([place])   . Colon polyp 2010   adenoma  . Diverticulosis of colon   . Dizziness   . Esophageal varices ([place])   . Fibromyalgia   . GERD (gastroesophageal reflux disease)   . History of nephrolithiasis   . Hypercholesterolemia    borderline  . Hypertension   . IBS (irritable bowel syndrome)   . Osteoarthritis   . Personal history of allergy to unspecified medicinal agent   . Postconcussion syndrome   . Vitamin D deficiency    Past Surgical History:  Procedure Laterality Date  . CHOLECYSTECTOMY, LAPAROSCOPIC  2004   for gallstone pancreatitis  . KNEE SURGERY Right   . KNEE SURGERY Left   . THYROID SURGERY     Social History:   reports that she quit smoking about 57 years ago. Her smoking use included cigarettes. She has a 16.00 pack-year smoking history. She has never used smokeless tobacco. She reports that she does not drink alcohol or use drugs.  Family History  Problem Relation Age of Onset  . Breast cancer Mother   . Alzheimer's disease Mother   . Heart disease Mother   . COPD Brother   . Heart disease Sister   . Breast cancer Sister 39  . Alcohol abuse Sister   . Ovarian  cancer Paternal Grandmother   . Arthritis Other        Cousins   . COPD Cousin        Maternal side   . Heart attack Maternal Uncle     Medications: Patient's Medications  New Prescriptions   No medications on file  Previous Medications   ACETAMINOPHEN (TYLENOL) 500 MG TABLET    Take 1,000 mg by mouth 2 (two) times a day.   ADVAIR HFA 115-21 MCG/ACT INHALER    TAKE 2 PUFFS BY MOUTH TWICE A DAY   AMLODIPINE (NORVASC) 2.5 MG TABLET    TAKE 1 TABLET BY MOUTH EVERY DAY   BENZONATATE (TESSALON) 100 MG CAPSULE    Take 1 capsule (100 mg total) by mouth 2 (two) times daily as needed for cough.   CALCIUM CARBONATE (TUMS - DOSED IN MG ELEMENTAL CALCIUM) 500 MG CHEWABLE TABLET    Chew 1 tablet by mouth as needed for indigestion or heartburn.   CALCIUM  CARBONATE-VITAMIN D (CALCIUM 600+D) 600-400 MG-UNIT TABLET    Take 1 tablet by mouth 2 (two) times daily.   CETIRIZINE HCL (ZYRTEC ALLERGY) 10 MG CAPS    Take 10 mg by mouth daily.    CHOLECALCIFEROL (VITAMIN D3) 1000 UNITS CAPS    Take 1,000 Units by mouth daily.    ESOMEPRAZOLE (NEXIUM) 40 MG CAPSULE    Take 1 tablet by mouth once or twice daily as needed   HYDROCHLOROTHIAZIDE (HYDRODIURIL) 25 MG TABLET    Take 1 tablet (25 mg total) by mouth daily.   KLOR-CON M20 20 MEQ TABLET    TAKE 1 TABLET BY MOUTH EVERY DAY   LOSARTAN (COZAAR) 25 MG TABLET    TAKE 1 TABLET BY MOUTH EVERY DAY   MECLIZINE (ANTIVERT) 25 MG TABLET    TAKE 1 TABLET 3 TIMES A DAY AS NEEDED FOR DIZZINESS/NAUSEA   MULTIPLE VITAMINS-MINERALS (CENTRUM SILVER PO)    Take 1 tablet by mouth daily.    OMEGA-3 FATTY ACIDS (FISH OIL MAXIMUM STRENGTH) 1200 MG CAPS    Take 1,200 mg by mouth 2 (two) times daily.    PROAIR HFA 108 (90 BASE) MCG/ACT INHALER    INHALE 2 PUFFS EVERY 4 HOURS AS NEEDED INTO THE LUNGS FOR WHEEZING OR SHORTNESS OF BREATH   PROPRANOLOL (INDERAL) 10 MG TABLET    Take 1 tablet (10 mg total) by mouth 2 (two) times daily.   PROPYLENE GLYCOL (SYSTANE BALANCE) 0.6 % SOLN    Place 1 drop into both ears 2 (two) times daily.   SALINE NASAL SPRAY NA    Place 1 spray into both nostrils 2 (two) times daily.   Modified Medications   No medications on file  Discontinued Medications   NONFORMULARY OR COMPOUNDED ITEM    Peripheral Neuropathy Cream: Bupivacaine 1%, Doxepin 3%, Gabapentin 6%, Pentoxifylline 3%, Topiramate 1% Order faxed to [facility]    Physical Exam:  Vitals:   07/07/19 1026  BP: 132/74  Pulse: 78  Temp: 97.7 F (36.5 C)  TempSrc: Temporal  SpO2: 98%  Weight: 172 lb (78 kg)  Height: 5' 1"  (1.549 m)   Body mass index is 32.5 kg/m. Wt Readings from Last 3 Encounters:  07/07/19 172 lb (78 kg)  06/23/19 171 lb (77.6 kg)  04/29/19 188 lb (85.3 kg)    Physical Exam***  Labs reviewed:  Basic Metabolic Panel: Recent Labs    09/02/18 0218 02/02/19 1122  NA 137 138  K 3.9 4.1  CL 101  99  CO2 23 31  GLUCOSE 143* 114*  BUN 17 16  CREATININE 0.80 0.66  CALCIUM 9.6 10.0  TSH  --  1.69   Liver Function Tests: Recent Labs    09/02/18 0218 02/02/19 1122  AST 56* 99*  ALT 66* 99*  ALKPHOS 123  --   BILITOT 0.8 0.8  PROT 6.3* 6.7  ALBUMIN 3.6  --    No results for input(s): LIPASE, AMYLASE in the last 8760 hours. No results for input(s): AMMONIA in the last 8760 hours. CBC: Recent Labs    09/02/18 0218 02/02/19 1122  WBC 9.2 7.6  NEUTROABS 3.7 3,063  HGB 14.7 14.5  HCT 44.1 42.2  MCV 87.5 86.5  PLT 268 296   Lipid Panel: Recent Labs    02/02/19 1122  CHOL 201*  HDL 46*  LDLCALC 118*  TRIG 259*  CHOLHDL 4.4   TSH: Recent Labs    02/02/19 1122  TSH 1.69   A1C: Lab Results  Component Value Date   HGBA1C 6.2 (H) 02/02/2019     Assessment/Plan There are no diagnoses linked to this encounter.  Next appt: *** Josephene Marrone K. [place], [facility] [phone]

## 2019-07-07 NOTE — Progress Notes (Signed)
Careteam: Patient Care Team: Lauree Chandler, NP as PCP - General (Nurse Practitioner)  Advanced Directive information Does Patient Have a Medical Advance Directive?: Yes, Type of Advance Directive: Out of facility DNR (pink MOST or yellow form), Pre-existing out of facility DNR order (yellow form or pink MOST form): Yellow form placed in chart (order not valid for inpatient use), Does patient want to make changes to medical advance directive?: No - Patient declined  Allergies  Allergen Reactions  . Ace Inhibitors     cough  . Amoxicillin Itching    REACTION: unknown? pain in right kidney  . Benicar Hct [Olmesartan Medoxomil-Hctz]     Extreme weakness  . Budesonide-Formoterol Fumarate     Causes tremors and numbness  . Chlorzoxazone [Chlorzoxazone]   . Citalopram Hydrobromide     REACTION: hives  . Citalopram Hydrobromide   . Clonidine Derivatives   . Droperidol     REACTION: hives  . Flexeril [Cyclobenzaprine Hcl]   . Fluconazole Nausea And Vomiting and Other (See Comments)    fatigue  . Ketorolac Tromethamine     REACTION: hives  . Ketorolac Tromethamine   . Metoclopramide Hcl   . Mometasone Furo-Formoterol Fum     Causes sore throat, blurred vision and unable to sleep--started on med 09-17-10  . Morphine     REACTION: hives and itching  . Olmesartan Medoxomil     REACTION: fatique  . Breo Ellipta [Fluticasone Furoate-Vilanterol] Itching    Pt reports itching with Memory Dance is related to being lactose intolerant.     Chief Complaint  Patient presents with  . Medical Management of Chronic Issues    4 month follow-up   . Sleeping Problem    Patient c/o trouble falling asleep   . Medication Management    Clarify twice daily medication dosing, patient questions if it means every 12 hours     HPI: Patient is a 79 y.o. female is here for routine follow-up. Reports she is doing very well. She is feeling much better since she has lost weight.   Overweight - Patient  states that she has been eating better and exercising more. She states that she is feeling much better. Continues to lose weight.   HTN  - controlled on current medication.   GERD - continues on nexium. Takes tums when needed for indigestion.   Patient reports she has been having trouble sleeping. She has been staying up later to take her medications. Encouraged a regular sleep routine.   Asthma - wheezing has improved. Cough has also improved.   Cirrhosis of the liver- following with GI, continues on propranolol. Had follow up ultrasound which was stable earlier this month.   Review of Systems:  Review of Systems  Constitutional: Negative for chills, fever and malaise/fatigue.  HENT: Positive for hearing loss. Negative for congestion and nosebleeds.   Eyes: Negative.  Negative for blurred vision.  Respiratory: Negative for cough.   Cardiovascular: Negative for chest pain, palpitations and leg swelling.  Gastrointestinal: Positive for heartburn. Negative for abdominal pain, constipation and diarrhea.  Genitourinary: Negative.   Musculoskeletal: Negative for back pain, joint pain and myalgias.  Skin: Negative.   Neurological: Negative.  Negative for dizziness, tingling and headaches.  Psychiatric/Behavioral: Negative.     Past Medical History:  Diagnosis Date  . ALLERGIC RHINITIS   . Anxiety   . Asthma   . Blood type O+   . Cervical strain, acute   . Cirrhosis ([place])   .  Colon polyp 2010   adenoma  . Diverticulosis of colon   . Dizziness   . Esophageal varices ([place])   . Fibromyalgia   . GERD (gastroesophageal reflux disease)   . History of nephrolithiasis   . Hypercholesterolemia    borderline  . Hypertension   . IBS (irritable bowel syndrome)   . Osteoarthritis   . Personal history of allergy to unspecified medicinal agent   . Postconcussion syndrome   . Vitamin D deficiency    Past Surgical History:  Procedure Laterality Date  . CHOLECYSTECTOMY, LAPAROSCOPIC   2004   for gallstone pancreatitis  . KNEE SURGERY Right   . KNEE SURGERY Left   . THYROID SURGERY     Social History:   reports that she quit smoking about 57 years ago. Her smoking use included cigarettes. She has a 16.00 pack-year smoking history. She has never used smokeless tobacco. She reports that she does not drink alcohol or use drugs.  Family History  Problem Relation Age of Onset  . Breast cancer Mother   . Alzheimer's disease Mother   . Heart disease Mother   . COPD Brother   . Heart disease Sister   . Breast cancer Sister 75  . Alcohol abuse Sister   . Ovarian cancer Paternal Grandmother   . Arthritis Other        Cousins   . COPD Cousin        Maternal side   . Heart attack Maternal Uncle     Medications: Patient's Medications  New Prescriptions   No medications on file  Previous Medications   ACETAMINOPHEN (TYLENOL) 500 MG TABLET    Take 1,000 mg by mouth 2 (two) times a day.   ADVAIR HFA 115-21 MCG/ACT INHALER    TAKE 2 PUFFS BY MOUTH TWICE A DAY   AMLODIPINE (NORVASC) 2.5 MG TABLET    TAKE 1 TABLET BY MOUTH EVERY DAY   BENZONATATE (TESSALON) 100 MG CAPSULE    Take 1 capsule (100 mg total) by mouth 2 (two) times daily as needed for cough.   CALCIUM CARBONATE (TUMS - DOSED IN MG ELEMENTAL CALCIUM) 500 MG CHEWABLE TABLET    Chew 1 tablet by mouth as needed for indigestion or heartburn.   CALCIUM CARBONATE-VITAMIN D (CALCIUM 600+D) 600-400 MG-UNIT TABLET    Take 1 tablet by mouth 2 (two) times daily.   CETIRIZINE HCL (ZYRTEC ALLERGY) 10 MG CAPS    Take 10 mg by mouth daily.    CHOLECALCIFEROL (VITAMIN D3) 1000 UNITS CAPS    Take 1,000 Units by mouth daily.    ESOMEPRAZOLE (NEXIUM) 40 MG CAPSULE    Take 1 tablet by mouth once or twice daily as needed   HYDROCHLOROTHIAZIDE (HYDRODIURIL) 25 MG TABLET    Take 1 tablet (25 mg total) by mouth daily.   KLOR-CON M20 20 MEQ TABLET    TAKE 1 TABLET BY MOUTH EVERY DAY   LOSARTAN (COZAAR) 25 MG TABLET    TAKE 1 TABLET BY  MOUTH EVERY DAY   MECLIZINE (ANTIVERT) 25 MG TABLET    TAKE 1 TABLET 3 TIMES A DAY AS NEEDED FOR DIZZINESS/NAUSEA   MULTIPLE VITAMINS-MINERALS (CENTRUM SILVER PO)    Take 1 tablet by mouth daily.    OMEGA-3 FATTY ACIDS (FISH OIL MAXIMUM STRENGTH) 1200 MG CAPS    Take 1,200 mg by mouth 2 (two) times daily.    PROAIR HFA 108 (90 BASE) MCG/ACT INHALER    INHALE 2 PUFFS EVERY 4 HOURS  AS NEEDED INTO THE LUNGS FOR WHEEZING OR SHORTNESS OF BREATH   PROPRANOLOL (INDERAL) 10 MG TABLET    Take 1 tablet (10 mg total) by mouth 2 (two) times daily.   PROPYLENE GLYCOL (SYSTANE BALANCE) 0.6 % SOLN    Place 1 drop into both ears 2 (two) times daily.   SALINE NASAL SPRAY NA    Place 1 spray into both nostrils 2 (two) times daily.   Modified Medications   No medications on file  Discontinued Medications   NONFORMULARY OR COMPOUNDED ITEM    Peripheral Neuropathy Cream: Bupivacaine 1%, Doxepin 3%, Gabapentin 6%, Pentoxifylline 3%, Topiramate 1% Order faxed to [facility]    Physical Exam:  Vitals:   07/07/19 1026  BP: 132/74  Pulse: 78  Temp: 97.7 F (36.5 C)  TempSrc: Temporal  SpO2: 98%  Weight: 172 lb (78 kg)  Height: 5' 1"  (1.549 m)   Body mass index is 32.5 kg/m. Wt Readings from Last 3 Encounters:  07/07/19 172 lb (78 kg)  06/23/19 171 lb (77.6 kg)  04/29/19 188 lb (85.3 kg)    Physical Exam HENT:     Head: Normocephalic and atraumatic.  Eyes:     Conjunctiva/sclera: Conjunctivae normal.  Cardiovascular:     Rate and Rhythm: Normal rate and regular rhythm.     Pulses: Normal pulses.     Heart sounds: Normal heart sounds.  Pulmonary:     Effort: Pulmonary effort is normal.     Breath sounds: Normal breath sounds.  Abdominal:     General: Bowel sounds are normal.     Palpations: Abdomen is soft.  Musculoskeletal:        General: Normal range of motion.     Cervical back: Normal range of motion and neck supple.  Skin:    General: Skin is warm and dry.     Comments:  Gait abnormal - uses cane  Neurological:     Mental Status: She is alert and oriented to person, place, and time.  Psychiatric:        Mood and Affect: Mood normal.        Behavior: Behavior normal.     Labs reviewed: Basic Metabolic Panel: Recent Labs    09/02/18 0218 02/02/19 1122  NA 137 138  K 3.9 4.1  CL 101 99  CO2 23 31  GLUCOSE 143* 114*  BUN 17 16  CREATININE 0.80 0.66  CALCIUM 9.6 10.0  TSH  --  1.69   Liver Function Tests: Recent Labs    09/02/18 0218 02/02/19 1122  AST 56* 99*  ALT 66* 99*  ALKPHOS 123  --   BILITOT 0.8 0.8  PROT 6.3* 6.7  ALBUMIN 3.6  --    No results for input(s): LIPASE, AMYLASE in the last 8760 hours. No results for input(s): AMMONIA in the last 8760 hours. CBC: Recent Labs    09/02/18 0218 02/02/19 1122  WBC 9.2 7.6  NEUTROABS 3.7 3,063  HGB 14.7 14.5  HCT 44.1 42.2  MCV 87.5 86.5  PLT 268 296   Lipid Panel: Recent Labs    02/02/19 1122  CHOL 201*  HDL 46*  LDLCALC 118*  TRIG 259*  CHOLHDL 4.4   TSH: Recent Labs    02/02/19 1122  TSH 1.69   A1C: Lab Results  Component Value Date   HGBA1C 6.2 (H) 02/02/2019     Assessment/Plan 1. Esophageal varices without bleeding, unspecified esophageal varices type (HCC) Stable, without complications at this time.  Recent follow up with GI. Continues on propranolol, Continues on nexium and tums. Encouraged to continue diet modification and weight loss.  2. Essential hypertension Stable at this time, continue on losartan, hctz, and amlodipine.  - CBC with Differential/Platelet - COMPLETE METABOLIC PANEL WITH GFR  3. Asthma, allergic, mild intermittent, uncomplicated Stable, continue on current regimen.   4. Prediabetes Continue with diet modifications and increase in physical activity  - Hemoglobin A1c  5. Hyperlipidemia, unspecified hyperlipidemia type -hopefully will see improvement in cholesterol with lifestyle changes, not currently on medication. -  COMPLETE METABOLIC PANEL WITH GFR - Lipid Panel  6. Obesity Doing well with weight loss, congratulated pt on her hard work and encouraged to continue with healthy lifestyle.   Next appt: 4 month follow up [place]. Harle Battiest I personally was present during the history, physical exam and medical decision-making activities of this service and have verified that the service and findings are accurately documented in the student's note  [facility] [phone]

## 2019-07-07 NOTE — Patient Instructions (Signed)
We can do your AWV on the phone if you would like or you can come into office  Follow up in 4 months for routine follow up  [place]!!!

## 2019-07-08 LAB — CBC WITH DIFFERENTIAL/PLATELET
Absolute Monocytes: 653 cells/uL (ref 200–950)
Basophils Absolute: 53 cells/uL (ref 0–200)
Basophils Relative: 0.8 %
Eosinophils Absolute: 158 cells/uL (ref 15–500)
Eosinophils Relative: 2.4 %
HCT: 43.1 % (ref 35.0–45.0)
Hemoglobin: 14.8 g/dL (ref 11.7–15.5)
Lymphs Abs: 3927 cells/uL — ABNORMAL HIGH (ref 850–3900)
MCH: 30.3 pg (ref 27.0–33.0)
MCHC: 34.3 g/dL (ref 32.0–36.0)
MCV: 88.3 fL (ref 80.0–100.0)
MPV: 10.2 fL (ref 7.5–12.5)
Monocytes Relative: 9.9 %
Neutro Abs: 1808 cells/uL (ref 1500–7800)
Neutrophils Relative %: 27.4 %
Platelets: 319 10*3/uL (ref 140–400)
RBC: 4.88 10*6/uL (ref 3.80–5.10)
RDW: 12.9 % (ref 11.0–15.0)
Total Lymphocyte: 59.5 %
WBC: 6.6 10*3/uL (ref 3.8–10.8)

## 2019-07-08 LAB — LIPID PANEL
Cholesterol: 231 mg/dL — ABNORMAL HIGH (ref <200)
HDL: 50 mg/dL (ref >=50)
LDL Cholesterol (Calc): 151 mg/dL (calc) — ABNORMAL HIGH
Non-HDL Cholesterol (Calc): 181 mg/dL (calc) — ABNORMAL HIGH (ref <130)
Total CHOL/HDL Ratio: 4.6 (calc) (ref <5.0)
Triglycerides: 163 mg/dL — ABNORMAL HIGH (ref <150)

## 2019-07-08 LAB — COMPLETE METABOLIC PANEL WITH GFR
AG Ratio: 1.9 (calc) (ref 1.0–2.5)
ALT: 33 U/L — ABNORMAL HIGH (ref 6–29)
AST: 35 U/L (ref 10–35)
Albumin: 4.6 g/dL (ref 3.6–5.1)
Alkaline phosphatase (APISO): 85 U/L (ref 37–153)
BUN: 20 mg/dL (ref 7–25)
CO2: 30 mmol/L (ref 20–32)
Calcium: 10.1 mg/dL (ref 8.6–10.4)
Chloride: 101 mmol/L (ref 98–110)
Creat: 0.73 mg/dL (ref 0.60–0.88)
GFR, Est African American: 90 mL/min/{1.73_m2} (ref >=60)
GFR, Est Non African American: 78 mL/min/{1.73_m2} (ref >=60)
Globulin: 2.4 g/dL (calc) (ref 1.9–3.7)
Glucose, Bld: 107 mg/dL — ABNORMAL HIGH (ref 65–99)
Potassium: 4.3 mmol/L (ref 3.5–5.3)
Sodium: 138 mmol/L (ref 135–146)
Total Bilirubin: 1 mg/dL (ref 0.2–1.2)
Total Protein: 7 g/dL (ref 6.1–8.1)

## 2019-07-08 LAB — HEMOGLOBIN A1C
Hgb A1c MFr Bld: 5.7 % of total Hgb — ABNORMAL HIGH (ref <5.7)
Mean Plasma Glucose: 117 (calc)
eAG (mmol/L): 6.5 (calc)

## 2019-07-09 ENCOUNTER — Ambulatory Visit: Payer: PPO | Admitting: Nurse Practitioner

## 2019-07-13 ENCOUNTER — Other Ambulatory Visit (INDEPENDENT_AMBULATORY_CARE_PROVIDER_SITE_OTHER): Payer: PPO

## 2019-07-13 DIAGNOSIS — K7460 Unspecified cirrhosis of liver: Secondary | ICD-10-CM | POA: Diagnosis not present

## 2019-07-13 DIAGNOSIS — K219 Gastro-esophageal reflux disease without esophagitis: Secondary | ICD-10-CM | POA: Diagnosis not present

## 2019-07-13 LAB — COMPREHENSIVE METABOLIC PANEL
ALT: 41 U/L — ABNORMAL HIGH (ref 0–35)
AST: 43 U/L — ABNORMAL HIGH (ref 0–37)
Albumin: 4.5 g/dL (ref 3.5–5.2)
Alkaline Phosphatase: 78 U/L (ref 39–117)
BUN: 14 mg/dL (ref 6–23)
CO2: 30 mEq/L (ref 19–32)
Calcium: 10.2 mg/dL (ref 8.4–10.5)
Chloride: 97 mEq/L (ref 96–112)
Creatinine, Ser: 0.72 mg/dL (ref 0.40–1.20)
GFR: 77.78 mL/min (ref >60.00)
Glucose, Bld: 109 mg/dL — ABNORMAL HIGH (ref 70–99)
Potassium: 3.6 mEq/L (ref 3.5–5.1)
Sodium: 135 mEq/L (ref 135–145)
Total Bilirubin: 1.1 mg/dL (ref 0.2–1.2)
Total Protein: 7.5 g/dL (ref 6.0–8.3)

## 2019-07-13 LAB — CBC WITH DIFFERENTIAL/PLATELET
Basophils Absolute: 0.1 10*3/uL (ref 0.0–0.1)
Basophils Relative: 0.8 % (ref 0.0–3.0)
Eosinophils Absolute: 0.1 10*3/uL (ref 0.0–0.7)
Eosinophils Relative: 1.2 % (ref 0.0–5.0)
HCT: 42.2 % (ref 36.0–46.0)
Hemoglobin: 14.6 g/dL (ref 12.0–15.0)
Lymphocytes Relative: 57.5 % — ABNORMAL HIGH (ref 12.0–46.0)
Lymphs Abs: 4.2 10*3/uL — ABNORMAL HIGH (ref 0.7–4.0)
MCHC: 34.7 g/dL (ref 30.0–36.0)
MCV: 87.7 fl (ref 78.0–100.0)
Monocytes Absolute: 0.8 10*3/uL (ref 0.1–1.0)
Monocytes Relative: 11 % (ref 3.0–12.0)
Neutro Abs: 2.2 10*3/uL (ref 1.4–7.7)
Neutrophils Relative %: 29.5 % — ABNORMAL LOW (ref 43.0–77.0)
Platelets: 302 10*3/uL (ref 150.0–400.0)
RBC: 4.81 Mil/uL (ref 3.87–5.11)
RDW: 12.8 % (ref 11.5–15.5)
WBC: 7.4 10*3/uL (ref 4.0–10.5)

## 2019-07-13 LAB — PROTIME-INR
INR: 1 ratio (ref 0.8–1.0)
Prothrombin Time: 11 s (ref 9.6–13.1)

## 2019-07-14 ENCOUNTER — Other Ambulatory Visit: Payer: Self-pay | Admitting: Nurse Practitioner

## 2019-07-14 LAB — AFP TUMOR MARKER: AFP-Tumor Marker: 4.6 ng/mL

## 2019-07-14 NOTE — Telephone Encounter (Signed)
High risk or very high risk warning populated when attempting to refill medication. RX request sent to PCP for review and approval if warranted.   

## 2019-07-21 DIAGNOSIS — L570 Actinic keratosis: Secondary | ICD-10-CM | POA: Diagnosis not present

## 2019-07-21 DIAGNOSIS — L812 Freckles: Secondary | ICD-10-CM | POA: Diagnosis not present

## 2019-07-21 DIAGNOSIS — L821 Other seborrheic keratosis: Secondary | ICD-10-CM | POA: Diagnosis not present

## 2019-07-21 DIAGNOSIS — Z85828 Personal history of other malignant neoplasm of skin: Secondary | ICD-10-CM | POA: Diagnosis not present

## 2019-07-31 ENCOUNTER — Other Ambulatory Visit: Payer: Self-pay | Admitting: Nurse Practitioner

## 2019-09-02 ENCOUNTER — Other Ambulatory Visit: Payer: Self-pay | Admitting: Nurse Practitioner

## 2019-09-17 ENCOUNTER — Other Ambulatory Visit: Payer: Self-pay | Admitting: Nurse Practitioner

## 2019-09-17 DIAGNOSIS — I10 Essential (primary) hypertension: Secondary | ICD-10-CM

## 2019-09-17 DIAGNOSIS — R809 Proteinuria, unspecified: Secondary | ICD-10-CM

## 2019-09-17 DIAGNOSIS — R7303 Prediabetes: Secondary | ICD-10-CM

## 2019-09-17 NOTE — Telephone Encounter (Signed)
Patient has been taking Losartan, but there are alerts when trying to refill.

## 2019-09-27 ENCOUNTER — Encounter: Payer: Self-pay | Admitting: Podiatry

## 2019-09-27 ENCOUNTER — Ambulatory Visit: Payer: PPO | Admitting: Podiatry

## 2019-09-27 ENCOUNTER — Other Ambulatory Visit: Payer: Self-pay

## 2019-09-27 DIAGNOSIS — M79671 Pain in right foot: Secondary | ICD-10-CM

## 2019-09-27 DIAGNOSIS — M79674 Pain in right toe(s): Secondary | ICD-10-CM | POA: Diagnosis not present

## 2019-09-27 DIAGNOSIS — M792 Neuralgia and neuritis, unspecified: Secondary | ICD-10-CM | POA: Diagnosis not present

## 2019-09-27 DIAGNOSIS — M79672 Pain in left foot: Secondary | ICD-10-CM | POA: Diagnosis not present

## 2019-09-27 DIAGNOSIS — B351 Tinea unguium: Secondary | ICD-10-CM

## 2019-09-27 DIAGNOSIS — L84 Corns and callosities: Secondary | ICD-10-CM | POA: Diagnosis not present

## 2019-09-27 DIAGNOSIS — M79675 Pain in left toe(s): Secondary | ICD-10-CM

## 2019-09-27 NOTE — Patient Instructions (Signed)

## 2019-10-01 ENCOUNTER — Telehealth: Payer: Self-pay

## 2019-10-01 ENCOUNTER — Encounter: Payer: Self-pay | Admitting: Nurse Practitioner

## 2019-10-01 ENCOUNTER — Ambulatory Visit (INDEPENDENT_AMBULATORY_CARE_PROVIDER_SITE_OTHER): Payer: PPO | Admitting: Nurse Practitioner

## 2019-10-01 ENCOUNTER — Other Ambulatory Visit: Payer: Self-pay

## 2019-10-01 DIAGNOSIS — Z0000 Encounter for general adult medical examination without abnormal findings: Secondary | ICD-10-CM

## 2019-10-01 DIAGNOSIS — E2839 Other primary ovarian failure: Secondary | ICD-10-CM

## 2019-10-01 NOTE — Progress Notes (Signed)
   This service is provided via telemedicine  No vital signs collected/recorded due to the encounter was a telemedicine visit.   Location of patient (ex: home, work):  Home  Patient consents to a telephone visit: Yes, see telephone encounter dated with annual consent   Location of the provider (ex: office, home):  [facility], Office   Name of any referring provider:  N/A  Names of all persons participating in the telemedicine service and their role in the encounter:  S.Chrae B/CMA, Sherrie Mustache, NP, and Patient   Time spent on call:  9 min with medical assistant

## 2019-10-01 NOTE — Patient Instructions (Addendum)
[name] , Thank you for taking time to come for your Medicare Wellness Visit. I appreciate your ongoing commitment to your health goals. Please review the following plan we discussed and let me know if I can assist you in the future.   Screening recommendations/referrals: Colonoscopy due 2022 Mammogram to get at [facility] Bone Density DUE- to schedule at [facility] Recommended yearly ophthalmology/optometry visit for glaucoma screening and checkup Recommended yearly dental visit for hygiene and checkup  Vaccinations: Influenza vaccine up to date, due Sept 2021 Pneumococcal vaccine up to date Tdap vaccine up to date Shingles vaccine up to date    Advanced directives: make sure to bring any update forms to office  Conditions/risks identified: advance age, family hx of cardiovascular disease, hypertension   Next appointment: 1 year    Preventive Care 24 Years and Older, Female Preventive care refers to lifestyle choices and visits with your health care provider that can promote health and wellness. What does preventive care include?  A yearly physical exam. This is also called an annual well check.  Dental exams once or twice a year.  Routine eye exams. Ask your health care provider how often you should have your eyes checked.  Personal lifestyle choices, including:  Daily care of your teeth and gums.  Regular physical activity.  Eating a healthy diet.  Avoiding tobacco and drug use.  Limiting alcohol use.  Practicing safe sex.  Taking low-dose aspirin every day.  Taking vitamin and mineral supplements as recommended by your health care provider. What happens during an annual well check? The services and screenings done by your health care provider during your annual well check will depend on your age, overall health, lifestyle risk factors, and family history of disease. Counseling  Your health care provider may  ask you questions about your:  Alcohol use.  Tobacco use.  Drug use.  Emotional well-being.  Home and relationship well-being.  Sexual activity.  Eating habits.  History of falls.  Memory and ability to understand (cognition).  Work and work Statistician.  Reproductive health. Screening  You may have the following tests or measurements:  Height, weight, and BMI.  Blood pressure.  Lipid and cholesterol levels. These may be checked every 5 years, or more frequently if you are over 7 years old.  Skin check.  Lung cancer screening. You may have this screening every year starting at age 110 if you have a 30-pack-year history of smoking and currently smoke or have quit within the past 15 years.  Fecal occult blood test (FOBT) of the stool. You may have this test every year starting at age 74.  Flexible sigmoidoscopy or colonoscopy. You may have a sigmoidoscopy every 5 years or a colonoscopy every 10 years starting at age 39.  Hepatitis C blood test.  Hepatitis B blood test.  Sexually transmitted disease (STD) testing.  Diabetes screening. This is done by checking your blood sugar (glucose) after you have not eaten for a while (fasting). You may have this done every 1-3 years.  Bone density scan. This is done to screen for osteoporosis. You may have this done starting at age 26.  Mammogram. This may be done every 1-2 years. Talk to your health care provider about how often you should have regular mammograms. Talk with your health care provider about your test results, treatment options, and if necessary, the need for more tests. Vaccines  Your health care provider may recommend certain vaccines, such as:  Influenza vaccine. This is recommended every year.  Tetanus, diphtheria, and acellular pertussis (Tdap, Td) vaccine. You may need a Td booster every 10 years.  Zoster vaccine. You may need this after age 53.  Pneumococcal 13-valent conjugate (PCV13) vaccine. One  dose is recommended after age 15.  Pneumococcal polysaccharide (PPSV23) vaccine. One dose is recommended after age 21. Talk to your health care provider about which screenings and vaccines you need and how often you need them. This information is not intended to replace advice given to you by your health care provider. Make sure you discuss any questions you have with your health care provider. Document Released: 05/26/2015 Document Revised: 01/17/2016 Document Reviewed: 02/28/2015 Elsevier Interactive Patient Education  2017 [employer] Prevention in the Home Falls can cause injuries. They can happen to people of all ages. There are many things you can do to make your home safe and to help prevent falls. What can I do on the outside of my home?  Regularly fix the edges of walkways and driveways and fix any cracks.  Remove anything that might make you trip as you walk through a door, such as a raised step or threshold.  Trim any bushes or trees on the path to your home.  Use bright outdoor lighting.  Clear any walking paths of anything that might make someone trip, such as rocks or tools.  Regularly check to see if handrails are loose or broken. Make sure that both sides of any steps have handrails.  Any raised decks and porches should have guardrails on the edges.  Have any leaves, snow, or ice cleared regularly.  Use sand or salt on walking paths during winter.  Clean up any spills in your garage right away. This includes oil or grease spills. What can I do in the bathroom?  Use night lights.  Install grab bars by the toilet and in the tub and shower. Do not use towel bars as grab bars.  Use non-skid mats or decals in the tub or shower.  If you need to sit down in the shower, use a plastic, non-slip stool.  Keep the floor dry. Clean up any water that spills on the floor as soon as it happens.  Remove soap buildup in the tub or shower regularly.  Attach bath  mats securely with double-sided non-slip rug tape.  Do not have throw rugs and other things on the floor that can make you trip. What can I do in the bedroom?  Use night lights.  Make sure that you have a light by your bed that is easy to reach.  Do not use any sheets or blankets that are too big for your bed. They should not hang down onto the floor.  Have a firm chair that has side arms. You can use this for support while you get dressed.  Do not have throw rugs and other things on the floor that can make you trip. What can I do in the kitchen?  Clean up any spills right away.  Avoid walking on wet floors.  Keep items that you use a lot in easy-to-reach places.  If you need to reach something above you, use a strong step stool that has a grab bar.  Keep electrical cords out of the way.  Do not use floor polish or wax that makes floors slippery. If you must use wax, use non-skid floor wax.  Do not have throw rugs and other things on the floor that  can make you trip. What can I do with my stairs?  Do not leave any items on the stairs.  Make sure that there are handrails on both sides of the stairs and use them. Fix handrails that are broken or loose. Make sure that handrails are as long as the stairways.  Check any carpeting to make sure that it is firmly attached to the stairs. Fix any carpet that is loose or worn.  Avoid having throw rugs at the top or bottom of the stairs. If you do have throw rugs, attach them to the floor with carpet tape.  Make sure that you have a light switch at the top of the stairs and the bottom of the stairs. If you do not have them, ask someone to add them for you. What else can I do to help prevent falls?  Wear shoes that:  Do not have high heels.  Have rubber bottoms.  Are comfortable and fit you well.  Are closed at the toe. Do not wear sandals.  If you use a stepladder:  Make sure that it is fully opened. Do not climb a closed  stepladder.  Make sure that both sides of the stepladder are locked into place.  Ask someone to hold it for you, if possible.  Clearly mark and make sure that you can see:  Any grab bars or handrails.  First and last steps.  Where the edge of each step is.  Use tools that help you move around (mobility aids) if they are needed. These include:  Canes.  Walkers.  Scooters.  Crutches.  Turn on the lights when you go into a dark area. Replace any light bulbs as soon as they burn out.  Set up your furniture so you have a clear path. Avoid moving your furniture around.  If any of your floors are uneven, fix them.  If there are any pets around you, be aware of where they are.  Review your medicines with your doctor. Some medicines can make you feel dizzy. This can increase your chance of falling. Ask your doctor what other things that you can do to help prevent falls. This information is not intended to replace advice given to you by your health care provider. Make sure you discuss any questions you have with your health care provider. Document Released: 02/23/2009 Document Revised: 10/05/2015 Document Reviewed: 06/03/2014 Elsevier Interactive Patient Education  2017 [employer].

## 2019-10-01 NOTE — Progress Notes (Signed)
Subjective:   [name] is a 79 y.o. female who presents for Medicare Annual (Subsequent) preventive examination.  Review of Systems:   Cardiac Risk Factors include: advanced age (>79mn, >>100women);family history of premature cardiovascular disease;hypertension;dyslipidemia     Objective:     Vitals: There were no vitals taken for this visit.  There is no height or weight on file to calculate BMI.  Advanced Directives 10/01/2019 07/07/2019 04/29/2019 02/17/2019 10/15/2018 09/24/2018 09/14/2018  Does Patient Have a Medical Advance Directive? Yes Yes Yes Yes Yes Yes Yes  Type of Advance Directive Out of facility DNR (pink MOST or yellow form) Out of facility DNR (pink MOST or yellow form) Out of facility DNR (pink MOST or yellow form) Out of facility DNR (pink MOST or yellow form) Out of facility DNR (pink MOST or yellow form) Out of facility DNR (pink MOST or yellow form) Out of facility DNR (pink MOST or yellow form)  Does patient want to make changes to medical advance directive? No - Patient declined No - Patient declined No - Patient declined No - Patient declined No - Patient declined No - Patient declined No - Patient declined  Would patient like information on creating a medical advance directive? - - - - - - -  Pre-existing out of facility DNR order (yellow form or pink MOST form) - Yellow form placed in chart (order not valid for inpatient use) Yellow form placed in chart (order not valid for inpatient use) Yellow form placed in chart (order not valid for inpatient use) Yellow form placed in chart (order not valid for inpatient use) - Yellow form placed in chart (order not valid for inpatient use)    Tobacco Social History   Tobacco Use  Smoking Status Former Smoker  . Packs/day: 2.00  . Years: 8.00  . Pack years: 16.00  . Types: Cigarettes  . Quit date: 05/13/1962  . Years since quitting: 57.4  Smokeless Tobacco Never Used     Counseling given: Not Answered   Clinical  Intake:  Pre-visit preparation completed: Yes  Pain : 0-10 Pain Score: 8  Pain Type: Neuropathic pain, Chronic pain Pain Location: Foot Pain Orientation: Left, Right Pain Descriptors / Indicators: Tingling, Burning, Radiating, Shooting Pain Onset: More than a month ago Pain Frequency: Intermittent     BMI - recorded: 27 Nutritional Status: BMI 25 -29 Overweight Nutritional Risks: None Diabetes: No  How often do you need to have someone help you when you read instructions, pamphlets, or other written materials from your doctor or pharmacy?: 1 - Never  Interpreter Needed?: No     Past Medical History:  Diagnosis Date  . ALLERGIC RHINITIS   . Anxiety   . Asthma   . Blood type O+   . Cervical strain, acute   . Cir[place]osis ([facility]   . Colon polyp 2010   adenoma  . Diverticulosis of colon   . Dizziness   . Esophageal varices ([facility]   . Fibromyalgia   . GERD (gastroesophageal reflux disease)   . History of nephrolithiasis   . Hypercholesterolemia    borderline  . Hypertension   . IBS (irritable bowel syndrome)   . Osteoarthritis   . Personal history of allergy to unspecified medicinal agent   . Postconcussion syndrome   . Vitamin D deficiency    Past Surgical History:  Procedure Laterality Date  . CHOLECYSTECTOMY, LAPAROSCOPIC  2004   for gallstone pancreatitis  . KNEE SURGERY Right   . KNEE  SURGERY Left   . THYROID SURGERY     Family History  Problem Relation Age of Onset  . Breast cancer Mother   . Alzheimer's disease Mother   . Heart disease Mother   . COPD Brother   . Heart disease Sister   . Breast cancer Sister 58  . Alcohol abuse Sister   . Ovarian cancer Paternal Grandmother   . Arthritis Other        Cousins   . COPD Cousin        Maternal side   . Heart attack Maternal Uncle    Social History   Socioeconomic History  . Marital status: Divorced    Spouse name: Not on file  . Number of children: 1  . Years of education: Not on file  .  Highest education level: Not on file  Occupational History  . Occupation: retired    Fish farm manager: [employer]    Comment: Presenter, broadcasting  Tobacco Use  . Smoking status: Former Smoker    Packs/day: 2.00    Years: 8.00    Pack years: 16.00    Types: Cigarettes    Quit date: 05/13/1962    Years since quitting: 57.4  . Smokeless tobacco: Never Used  Substance and Sexual Activity  . Alcohol use: No  . Drug use: No  . Sexual activity: Not Currently    Birth control/protection: Post-menopausal  Other Topics Concern  . Not on file  Social History Narrative   exercises 3x a week   drinks 1 cup caffeine every other day   Social Determinants of Health   Financial Resource Strain:   . Difficulty of Paying Living Expenses:   Food Insecurity:   . Worried About Charity fundraiser in the Last Year:   . Arboriculturist in the Last Year:   Transportation Needs:   . Film/video editor (Medical):   Marland Kitchen Lack of Transportation (Non-Medical):   Physical Activity:   . Days of Exercise per Week:   . Minutes of Exercise per Session:   Stress:   . Feeling of Stress :   Social Connections:   . Frequency of Communication with Friends and Family:   . Frequency of Social Gatherings with Friends and Family:   . Attends Religious Services:   . Active Member of Clubs or Organizations:   . Attends Archivist Meetings:   Marland Kitchen Marital Status:     Outpatient Encounter Medications as of 10/01/2019  Medication Sig  . acetaminophen (TYLENOL) 500 MG tablet Take 1,000 mg by mouth 2 (two) times a day.  Marland Kitchen ADVAIR HFA 115-21 MCG/ACT inhaler TAKE 2 PUFFS BY MOUTH TWICE A DAY  . amLODipine (NORVASC) 2.5 MG tablet TAKE 1 TABLET BY MOUTH EVERY DAY  . benzonatate (TESSALON) 100 MG capsule Take 1 capsule (100 mg total) by mouth 2 (two) times daily as needed for cough.  . calcium carbonate (TUMS - DOSED IN MG ELEMENTAL CALCIUM) 500 MG chewable tablet Chew 1 tablet by mouth as  needed for indigestion or heartburn.  . Calcium Carbonate-Vitamin D (CALCIUM 600+D) 600-400 MG-UNIT tablet Take 1 tablet by mouth 2 (two) times daily.  . Cetirizine HCl (ZYRTEC ALLERGY) 10 MG CAPS Take 10 mg by mouth daily.   . Cholecalciferol (VITAMIN D3) 1000 UNITS CAPS Take 1,000 Units by mouth daily.   Marland Kitchen esomeprazole (NEXIUM) 40 MG capsule Take 1 tablet by mouth once or twice daily as needed  . hydrochlorothiazide (HYDRODIURIL)  25 MG tablet Take 1 tablet (25 mg total) by mouth daily.  Marland Kitchen KLOR-CON M20 20 MEQ tablet TAKE 1 TABLET BY MOUTH EVERY DAY  . losartan (COZAAR) 25 MG tablet TAKE 1 TABLET BY MOUTH EVERY DAY  . meclizine (ANTIVERT) 25 MG tablet TAKE 1 TABLET BY MOUTH 3 TIMES A DAY AS NEEDED FOR DIZZINESS OR NAUSEA  . Multiple Vitamins-Minerals (CENTRUM SILVER PO) Take 1 tablet by mouth daily.   . Omega-3 Fatty Acids (FISH OIL MAXIMUM STRENGTH) 1200 MG CAPS Take 1,200 mg by mouth 2 (two) times daily.   Marland Kitchen PROAIR HFA 108 (90 Base) MCG/ACT inhaler INHALE 2 PUFFS EVERY 4 HOURS AS NEEDED INTO THE LUNGS FOR WHEEZING OR SHORTNESS OF BREATH  . propranolol (INDERAL) 10 MG tablet Take 1 tablet (10 mg total) by mouth 2 (two) times daily.  Marland Kitchen Propylene Glycol (SYSTANE BALANCE) 0.6 % SOLN Place 1 drop into both ears 2 (two) times daily.  Marland Kitchen SALINE NASAL SPRAY NA Place 1 spray into both nostrils 2 (two) times daily.   . [DISCONTINUED] SHINGRIX injection    No facility-administered encounter medications on file as of 10/01/2019.    Activities of Daily Living In your present state of health, do you have any difficulty performing the following activities: 10/01/2019  Hearing? Y  Vision? Y  Difficulty concentrating or making decisions? N  Walking or climbing stairs? Y  Comment avoids stairs  Dressing or bathing? N  Doing errands, shopping? N  Preparing Food and eating ? N  Using the Toilet? N  Managing your Medications? N  Managing your Finances? N  Housekeeping or managing your Housekeeping? N    Some recent data might be hidden    Patient Care Team: Lauree Chandler, NP as PCP - General (Nurse Practitioner)    Assessment:   This is a routine wellness examination for [name].  Exercise Activities and Dietary recommendations Current Exercise Habits: Home exercise routine, Type of exercise: walking;calisthenics;strength training/weights, Time (Minutes): 45, Frequency (Times/Week): 4, Weekly Exercise (Minutes/Week): 180  Goals    . LDL CALC < 130    . Weight (lb) < 140 lb (63.5 kg)     Starting 07/02/16, I will attempt to decrease my weight to reach my goal weight of 140 lbs.        Fall Risk Fall Risk  10/01/2019 07/07/2019 04/06/2019 02/17/2019 10/15/2018  Falls in the past year? 0 0 0 0 0  Number falls in past yr: 0 0 0 0 0  Injury with Fall? 0 0 0 0 0  Follow up - - - - -   Is the patient's home free of loose throw rugs in walkways, pet beds, electrical cords, etc?   yes      Grab bars in the bathroom? yes      Handrails on the stairs?   yes      Adequate lighting?   yes  Timed Get Up and Go performed: na  Depression Screen PHQ 2/9 Scores 10/01/2019 02/17/2019 10/15/2018 09/24/2018  PHQ - 2 Score 0 0 0 0     Cognitive Function MMSE - Mini Mental State Exam 07/16/2017 07/02/2016 04/04/2015 09/22/2013  Not completed: - - (No Data) -  Orientation to time 5 5 5 5   Orientation to Place 5 4 5 5   Registration 3 3 3 3   Attention/ Calculation 5 5 5 5   Recall 2 3 2 2   Language- name 2 objects 2 2 2 2   Language- repeat 1 1 1  1  Language- follow 3 step command 3 3 3 3   Language- read & follow direction 1 1 1 1   Write a sentence 1 1 1 1   Copy design 1 1 1 1   Total score 29 29 29 29      6CIT Screen 10/01/2019 09/24/2018  What Year? 0 points -  What month? 0 points -  What time? 0 points 0 points  Count back from 20 0 points 0 points  Months in reverse 0 points 2 points  Repeat phrase 0 points 0 points  Total Score 0 -    Immunization History  Administered Date(s)  Administered  . Hepatitis A, Adult 10/15/2017, 04/17/2018  . Influenza Split 02/11/2011, 02/25/2012  . Influenza Whole 02/10/2009, 02/26/2010  . Influenza, High Dose Seasonal PF 04/24/2017, 01/26/2018, 01/26/2018, 01/03/2019  . Influenza,inj,Quad PF,6+ Mos 02/09/2014, 03/20/2015, 02/27/2016  . Influenza-Unspecified 03/13/2013, 03/13/2017  . PFIZER SARS-COV-2 Vaccination 06/03/2019, 06/22/2019  . Pneumococcal Conjugate-13 02/27/2016  . Pneumococcal Polysaccharide-23 08/25/2014  . Tdap 12/06/2013  . Zoster Recombinat (Shingrix) 01/13/2019, 08/05/2019    Qualifies for Shingles Vaccine?yes, completed  Screening Tests Health Maintenance  Topic Date Due  . INFLUENZA VACCINE  12/12/2019  . COLONOSCOPY  06/25/2020  . TETANUS/TDAP  12/07/2023  . DEXA SCAN  Completed  . COVID-19 Vaccine  Completed  . PNA vac Low Risk Adult  Completed    Cancer Screenings: Lung: Low Dose CT Chest recommended if Age 61-80 years, 30 pack-year currently smoking OR have quit w/in 15years. Patient does not qualify. Breast:  Up to date on Mammogram? No   Up to date of Bone Density/Dexa? No Colorectal: up to date  Additional Screenings: na Hepatitis C Screening:      Plan:      I have personally reviewed and noted the following in the patient's chart:   . Medical and social history . Use of alcohol, tobacco or illicit drugs  . Current medications and supplements . Functional ability and status . Nutritional status . Physical activity . Advanced directives . List of other physicians . Hospitalizations, surgeries, and ER visits in previous 12 months . Vitals . Screenings to include cognitive, depression, and falls . Referrals and appointments  In addition, I have reviewed and discussed with patient certain preventive protocols, quality metrics, and best practice recommendations. A written personalized care plan for preventive services as well as general preventive health recommendations were  provided to patient.     Lauree Chandler, NP  10/01/2019

## 2019-10-01 NOTE — Telephone Encounter (Signed)
Ms. danai, gotto are scheduled for a virtual visit with your provider today.    Just as we do with appointments in the office, we must obtain your consent to participate.  Your consent will be active for this visit and any virtual visit you may have with one of our providers in the next 365 days.    If you have a MyChart account, I can also send a copy of this consent to you electronically.  All virtual visits are billed to your insurance company just like a traditional visit in the office.  As this is a virtual visit, video technology does not allow for your provider to perform a traditional examination.  This may limit your provider's ability to fully assess your condition.  If your provider identifies any concerns that need to be evaluated in person or the need to arrange testing such as labs, EKG, etc, we will make arrangements to do so.    Although advances in technology are sophisticated, we cannot ensure that it will always work on either your end or our end.  If the connection with a video visit is poor, we may have to switch to a telephone visit.  With either a video or telephone visit, we are not always able to ensure that we have a secure connection.   I need to obtain your verbal consent now.   Are you willing to proceed with your visit today?   Shantel Helwig Deckard has provided verbal consent on 10/01/2019 for a virtual visit (video or telephone).   Leigh [place], [place] 10/01/2019  8:45 am

## 2019-10-03 NOTE — Progress Notes (Signed)
Subjective: [name] is a 79 y.o. female patient seen today callus(es) plantar aspect of both feet and painful mycotic toenails b/l that are difficult to trim. Pain interferes with ambulation. Aggravating factors include wearing enclosed shoe gear. Pain is relieved with periodic professional debridement.  She also has h/o neuropathic pain. Is using topical compounded neuropathy cream from [place]. It does help with her symptoms.   Patient Active Problem List   Diagnosis Date Noted  . Esophageal varices without bleeding ([place]) 07/07/2019  . Bilateral hip pain 04/29/2019  . Pancreatitis 07/05/2017  . Chronic pansinusitis 12/02/2016  . Asthma, allergic, mild intermittent, uncomplicated 50/53/9767  . Environmental and seasonal allergies 12/02/2016  . Obesity (BMI 30.0-34.9) 12/02/2016  . Allergic rhinitis 10/15/2016  . Presbyopia of both eyes 06/27/2016  . Pseudophakia 06/27/2016  . Urine frequency 06/26/2016  . Obese 06/26/2016  . Hyperglycemia 11/13/2015  . Numbness and tingling of both legs below knees 05/24/2014  . Dupuytren's contracture of both hands 01/18/2014  . Dyslipidemia 10/08/2011  . Asthmatic bronchitis 04/25/2011  . Acute sinusitis, unspecified 12/20/2010  . Back pain 07/12/2010  . NEPHROLITHIASIS 04/26/2009  . Vitamin D deficiency 10/30/2008  . COLONIC POLYPS 10/22/2007  . Diverticulosis of colon 10/22/2007  . Postconcussion syndrome 07/10/2007  . Seasonal and perennial allergic rhinitis 06/13/2007  . GERD 06/13/2007  . IBS 06/13/2007  . FIBROMYALGIA 06/13/2007  . PERSONAL HISTORY ALLERGY UNSPEC MEDICINAL AGENT 06/13/2007  . Anxiety state 05/04/2007  . Essential hypertension 05/04/2007  . Osteoarthritis 05/04/2007  . DIZZINESS 04/28/2007    Current Outpatient Medications on File Prior to Visit  Medication Sig Dispense Refill  . acetaminophen (TYLENOL) 500 MG tablet Take 1,000 mg by mouth 2 (two) times a day.    Marland Kitchen ADVAIR HFA 115-21 MCG/ACT  inhaler TAKE 2 PUFFS BY MOUTH TWICE A DAY 12 Inhaler 5  . amLODipine (NORVASC) 2.5 MG tablet TAKE 1 TABLET BY MOUTH EVERY DAY 90 tablet 1  . benzonatate (TESSALON) 100 MG capsule Take 1 capsule (100 mg total) by mouth 2 (two) times daily as needed for cough. 20 capsule 0  . calcium carbonate (TUMS - DOSED IN MG ELEMENTAL CALCIUM) 500 MG chewable tablet Chew 1 tablet by mouth as needed for indigestion or heartburn.    . Calcium Carbonate-Vitamin D (CALCIUM 600+D) 600-400 MG-UNIT tablet Take 1 tablet by mouth 2 (two) times daily.    . Cetirizine HCl (ZYRTEC ALLERGY) 10 MG CAPS Take 10 mg by mouth daily.     . Cholecalciferol (VITAMIN D3) 1000 UNITS CAPS Take 1,000 Units by mouth daily.     Marland Kitchen esomeprazole (NEXIUM) 40 MG capsule Take 1 tablet by mouth once or twice daily as needed 180 capsule 1  . hydrochlorothiazide (HYDRODIURIL) 25 MG tablet Take 1 tablet (25 mg total) by mouth daily. 90 tablet 3  . KLOR-CON M20 20 MEQ tablet TAKE 1 TABLET BY MOUTH EVERY DAY 90 tablet 1  . losartan (COZAAR) 25 MG tablet TAKE 1 TABLET BY MOUTH EVERY DAY 90 tablet 1  . meclizine (ANTIVERT) 25 MG tablet TAKE 1 TABLET BY MOUTH 3 TIMES A DAY AS NEEDED FOR DIZZINESS OR NAUSEA 90 tablet 0  . Multiple Vitamins-Minerals (CENTRUM SILVER PO) Take 1 tablet by mouth daily.     . Omega-3 Fatty Acids (FISH OIL MAXIMUM STRENGTH) 1200 MG CAPS Take 1,200 mg by mouth 2 (two) times daily.     Marland Kitchen PROAIR HFA 108 (90 Base) MCG/ACT inhaler INHALE 2 PUFFS EVERY 4 HOURS AS  NEEDED INTO THE LUNGS FOR WHEEZING OR SHORTNESS OF BREATH 25.5 Inhaler 1  . propranolol (INDERAL) 10 MG tablet Take 1 tablet (10 mg total) by mouth 2 (two) times daily. 60 tablet 2  . Propylene Glycol (SYSTANE BALANCE) 0.6 % SOLN Place 1 drop into both ears 2 (two) times daily. 15 mL 0  . SALINE NASAL SPRAY NA Place 1 spray into both nostrils 2 (two) times daily.      No current facility-administered medications on file prior to visit.    Allergies  Allergen Reactions   . Ace Inhibitors     cough  . Amoxicillin Itching    REACTION: unknown? pain in right kidney  . Benicar Hct [Olmesartan Medoxomil-Hctz]     Extreme weakness  . Budesonide-Formoterol Fumarate     Causes tremors and numbness  . Chlorzoxazone [Chlorzoxazone]   . Citalopram Hydrobromide     REACTION: hives  . Citalopram Hydrobromide   . Clonidine Derivatives   . Droperidol     REACTION: hives  . Flexeril [Cyclobenzaprine Hcl]   . Fluconazole Nausea And Vomiting and Other (See Comments)    fatigue  . Ketorolac Tromethamine     REACTION: hives  . Ketorolac Tromethamine   . Metoclopramide Hcl   . Mometasone Furo-Formoterol Fum     Causes sore throat, blurred vision and unable to sleep--started on med 09-17-10  . Morphine     REACTION: hives and itching  . Olmesartan Medoxomil     REACTION: fatique  . Breo Ellipta [Fluticasone Furoate-Vilanterol] Itching    Pt reports itching with Memory Dance is related to being lactose intolerant.     Objective: Physical Exam  General: [place] is a pleasant 79 y.o.  Caucasian female, in NAD. AAO x 3.   Vascular:  Capillary fill time to digits <3 seconds b/l. Palpable DP pulses b/l. Palpable PT pulses b/l. Pedal hair absent b/l Skin temperature gradient within normal limits b/l. No edema noted b/l.  Dermatological:  Pedal skin with normal turgor, texture and tone bilaterally. No open wounds bilaterally. No interdigital macerations bilaterally. Toenails 1-5 b/l elongated, dystrophic, thickened, crumbly with subungual debris and tenderness to dorsal palpation. Hyperkeratotic lesion(s) submet head 2 right foot and submet head 3 left foot.  No erythema, no edema, no drainage, no flocculence.  Musculoskeletal:  Normal muscle strength 5/5 to all lower extremity muscle groups bilaterally. No gross bony deformities bilaterally. No pain crepitus or joint limitation noted with ROM b/l.  Neurological:  Protective sensation intact 5/5 intact bilaterally  with 10g monofilament b/l. Vibratory sensation intact b/l. Proprioception intact bilaterally.  Assessment and Plan:  1. Callus   2. Pain in both feet   3. Neuropathic pain    -Examined patient. -No new findings. No new orders. -Toenails 1-5 b/l debrided in length and girth with sterile nail nipper and dremel without iatrogenic laceration. -Callus(es) submet head 2 right foot and submet head 3 left foot pared utilizing sterile scalpel blade without complication or incident. Total number debrided =2. -Continue compounded topical neuropathy cream from [place]. -Patient to continue soft, supportive shoe gear daily. -Patient to report any pedal injuries to medical professional immediately. -Patient/POA to call should there be question/concern in the interim.  Return in about 6 months (around 03/29/2020) for nail and callus trim.  Marzetta Board, DPM

## 2019-10-04 ENCOUNTER — Other Ambulatory Visit: Payer: Self-pay | Admitting: Nurse Practitioner

## 2019-10-04 ENCOUNTER — Other Ambulatory Visit: Payer: Self-pay

## 2019-10-04 ENCOUNTER — Ambulatory Visit (INDEPENDENT_AMBULATORY_CARE_PROVIDER_SITE_OTHER): Payer: PPO | Admitting: Nurse Practitioner

## 2019-10-04 ENCOUNTER — Encounter: Payer: Self-pay | Admitting: Nurse Practitioner

## 2019-10-04 VITALS — BP 126/72 | HR 70 | Temp 97.5°F | Ht 61.0 in | Wt 157.0 lb

## 2019-10-04 DIAGNOSIS — G629 Polyneuropathy, unspecified: Secondary | ICD-10-CM | POA: Diagnosis not present

## 2019-10-04 DIAGNOSIS — Z1231 Encounter for screening mammogram for malignant neoplasm of breast: Secondary | ICD-10-CM

## 2019-10-04 MED ORDER — GABAPENTIN 100 MG PO CAPS: 100.0000 mg | ORAL_CAPSULE | Freq: Every day | ORAL | 1 refills | Status: DC

## 2019-10-04 NOTE — Progress Notes (Signed)
Careteam: Patient Care Team: Carla Chandler, NP as PCP - General (Nurse Practitioner)  PLACE OF SERVICE:  [place]  Advanced Directive information    Allergies  Allergen Reactions  . Ace Inhibitors     cough  . Amoxicillin Itching    REACTION: unknown? pain in right kidney  . Benicar Hct [Olmesartan Medoxomil-Hctz]     Extreme weakness  . Budesonide-Formoterol Fumarate     Causes tremors and numbness  . Chlorzoxazone [Chlorzoxazone]   . Citalopram Hydrobromide     REACTION: hives  . Citalopram Hydrobromide   . Clonidine Derivatives   . Droperidol     REACTION: hives  . Flexeril [Cyclobenzaprine Hcl]   . Fluconazole Nausea And Vomiting and Other (See Comments)    fatigue  . Ketorolac Tromethamine     REACTION: hives  . Ketorolac Tromethamine   . Metoclopramide Hcl   . Mometasone Furo-Formoterol Fum     Causes sore throat, blurred vision and unable to sleep--started on med 09-17-10  . Morphine     REACTION: hives and itching  . Olmesartan Medoxomil     REACTION: fatique  . Breo Ellipta [Fluticasone Furoate-Vilanterol] Itching    Pt reports itching with Memory Dance is related to being lactose intolerant.     Chief Complaint  Patient presents with  . Foot Problem    Patient c/o foot pain, numbness, cramping, and tingling  . Hand Problem    Patient c/o bilateral hand numbness and pain     HPI: Patient is a 79 y.o. female here today due to numbness and tingling and pain in her legs and hands. Has been going on for over a year but worse over the last 3 months and several for her friends have recommended gabapentin.  The pain is severe and causing her not to be able to sleep. Several ppl in her family have neuropathy.  She does have arthritis in neck and lower back. DDD noted to lumbar spine in 2019. No loss of bowel or bladder function  Continues to walk daily to help strengthen legs and for weight loss She has been doing diet and exercise and lost from 172 lbs to  157 lbs. Continues to have to use cane.   Review of Systems:  Review of Systems  Constitutional: Negative for chills and fever.  Musculoskeletal: Positive for back pain, myalgias and neck pain. Negative for falls and joint pain.  Neurological: Positive for tingling. Negative for dizziness, weakness and headaches.   Past Medical History:  Diagnosis Date  . ALLERGIC RHINITIS   . Anxiety   . Asthma   . Blood type O+   . Cervical strain, acute   . Cirrhosis ([place])   . Colon polyp 2010   adenoma  . Diverticulosis of colon   . Dizziness   . Esophageal varices ([place])   . Fibromyalgia   . GERD (gastroesophageal reflux disease)   . History of nephrolithiasis   . Hypercholesterolemia    borderline  . Hypertension   . IBS (irritable bowel syndrome)   . Osteoarthritis   . Personal history of allergy to unspecified medicinal agent   . Postconcussion syndrome   . Vitamin D deficiency    Past Surgical History:  Procedure Laterality Date  . CHOLECYSTECTOMY, LAPAROSCOPIC  2004   for gallstone pancreatitis  . KNEE SURGERY Right   . KNEE SURGERY Left   . THYROID SURGERY     Social History:   reports that she quit smoking about  57 years ago. Her smoking use included cigarettes. She has a 16.00 pack-year smoking history. She has never used smokeless tobacco. She reports that she does not drink alcohol or use drugs.  Family History  Problem Relation Age of Onset  . Breast cancer Mother   . Alzheimer's disease Mother   . Heart disease Mother   . COPD Brother   . Heart disease Sister   . Breast cancer Sister 7  . Alcohol abuse Sister   . Ovarian cancer Paternal Grandmother   . Arthritis Other        Cousins   . COPD Cousin        Maternal side   . Heart attack Maternal Uncle     Medications: Patient's Medications  New Prescriptions   No medications on file  Previous Medications   ACETAMINOPHEN (TYLENOL) 500 MG TABLET    Take 1,000 mg by mouth 2 (two) times a day.   ADVAIR  HFA 115-21 MCG/ACT INHALER    TAKE 2 PUFFS BY MOUTH TWICE A DAY   AMLODIPINE (NORVASC) 2.5 MG TABLET    TAKE 1 TABLET BY MOUTH EVERY DAY   BENZONATATE (TESSALON) 100 MG CAPSULE    Take 1 capsule (100 mg total) by mouth 2 (two) times daily as needed for cough.   CALCIUM CARBONATE (TUMS - DOSED IN MG ELEMENTAL CALCIUM) 500 MG CHEWABLE TABLET    Chew 1 tablet by mouth as needed for indigestion or heartburn.   CALCIUM CARBONATE-VITAMIN D (CALCIUM 600+D) 600-400 MG-UNIT TABLET    Take 1 tablet by mouth 2 (two) times daily.   CETIRIZINE HCL (ZYRTEC ALLERGY) 10 MG CAPS    Take 10 mg by mouth daily.    CHOLECALCIFEROL (VITAMIN D3) 1000 UNITS CAPS    Take 1,000 Units by mouth daily.    ESOMEPRAZOLE (NEXIUM) 40 MG CAPSULE    Take 1 tablet by mouth once or twice daily as needed   HYDROCHLOROTHIAZIDE (HYDRODIURIL) 25 MG TABLET    Take 1 tablet (25 mg total) by mouth daily.   KLOR-CON M20 20 MEQ TABLET    TAKE 1 TABLET BY MOUTH EVERY DAY   LOSARTAN (COZAAR) 25 MG TABLET    TAKE 1 TABLET BY MOUTH EVERY DAY   MECLIZINE (ANTIVERT) 25 MG TABLET    TAKE 1 TABLET BY MOUTH 3 TIMES A DAY AS NEEDED FOR DIZZINESS OR NAUSEA   MULTIPLE VITAMINS-MINERALS (CENTRUM SILVER PO)    Take 1 tablet by mouth daily.    OMEGA-3 FATTY ACIDS (FISH OIL MAXIMUM STRENGTH) 1200 MG CAPS    Take 1,200 mg by mouth 2 (two) times daily.    PROAIR HFA 108 (90 BASE) MCG/ACT INHALER    INHALE 2 PUFFS EVERY 4 HOURS AS NEEDED INTO THE LUNGS FOR WHEEZING OR SHORTNESS OF BREATH   PROPRANOLOL (INDERAL) 10 MG TABLET    Take 1 tablet (10 mg total) by mouth 2 (two) times daily.   PROPYLENE GLYCOL (SYSTANE BALANCE) 0.6 % SOLN    Place 1 drop into both ears 2 (two) times daily.   SALINE NASAL SPRAY NA    Place 1 spray into both nostrils 2 (two) times daily.   Modified Medications   No medications on file  Discontinued Medications   No medications on file    Physical Exam:  Vitals:   10/04/19 1526  BP: 126/72  Pulse: 70  Temp: (!) 97.5 F (36.4  C)  TempSrc: Temporal  SpO2: 97%  Weight: 157 lb (71.2 kg)  Height: 5' 1"  (1.549 m)   Body mass index is 29.66 kg/m. Wt Readings from Last 3 Encounters:  10/04/19 157 lb (71.2 kg)  07/07/19 172 lb (78 kg)  06/23/19 171 lb (77.6 kg)    Physical Exam Constitutional:      Appearance: Normal appearance.  Cardiovascular:     Pulses: Normal pulses.  Musculoskeletal:        General: No swelling or tenderness.     Right lower leg: No edema.     Left lower leg: No edema.  Skin:    General: Skin is warm and dry.  Neurological:     Mental Status: She is alert and oriented to person, place, and time.     Sensory: No sensory deficit.     Motor: No weakness.     Coordination: Coordination normal.     Gait: Gait abnormal (slowed, uses cane).     Deep Tendon Reflexes: Reflexes normal.  Psychiatric:        Mood and Affect: Mood normal.        Behavior: Behavior normal.     Labs reviewed: Basic Metabolic Panel: Recent Labs    02/02/19 1122 07/07/19 1113 07/13/19 1217  NA 138 138 135  K 4.1 4.3 3.6  CL 99 101 97  CO2 31 30 30   GLUCOSE 114* 107* 109*  BUN 16 20 14   CREATININE 0.66 0.73 0.72  CALCIUM 10.0 10.1 10.2  TSH 1.69  --   --    Liver Function Tests: Recent Labs    02/02/19 1122 07/07/19 1113 07/13/19 1217  AST 99* 35 43*  ALT 99* 33* 41*  ALKPHOS  --   --  78  BILITOT 0.8 1.0 1.1  PROT 6.7 7.0 7.5  ALBUMIN  --   --  4.5   No results for input(s): LIPASE, AMYLASE in the last 8760 hours. No results for input(s): AMMONIA in the last 8760 hours. CBC: Recent Labs    02/02/19 1122 07/07/19 1113 07/13/19 1217  WBC 7.6 6.6 7.4  NEUTROABS 3,063 1,808 2.2  HGB 14.5 14.8 14.6  HCT 42.2 43.1 42.2  MCV 86.5 88.3 87.7  PLT 296 319 302.0   Lipid Panel: Recent Labs    02/02/19 1122 07/07/19 1113  CHOL 201* 231*  HDL 46* 50  LDLCALC 118* 151*  TRIG 259* 163*  CHOLHDL 4.4 4.6   TSH: Recent Labs    02/02/19 1122  TSH 1.69   A1C: Lab Results    Component Value Date   HGBA1C 5.7 (H) 07/07/2019     Assessment/Plan 1. Neuropathy -normal sensation with pinprick to hands and feet with good strength bilaterally to upper and lower extremities. Noted DDD on multiple levels to lumbar spine on xray from 2019. Will start gabapentin at this time to see if this helps improve symptoms. May need further imaging but she would like to hold off on this at this time. Discussed warning signs and when to seek immediate attention  - gabapentin (NEURONTIN) 100 MG capsule; Take 1 capsule (100 mg total) by mouth at bedtime.  Dispense: 30 capsule; Refill: 1  Next appt: 11/05/2019 as scheduled  Jessen Siegman K. [place], [facility] [phone]

## 2019-10-04 NOTE — Patient Instructions (Addendum)
Start gabapentin 100 mg by mouth at bedtime to help with neuropathy and pain  Notify if unable to tolerate or medication not benefiting    Keep follow up in 1 month

## 2019-10-07 ENCOUNTER — Ambulatory Visit
Admission: RE | Admit: 2019-10-07 | Discharge: 2019-10-07 | Disposition: A | Discharge status: Home / Self Care | Payer: PPO | Source: Ambulatory Visit | Attending: Nurse Practitioner | Admitting: Nurse Practitioner

## 2019-10-07 DIAGNOSIS — Z1231 Encounter for screening mammogram for malignant neoplasm of breast: Secondary | ICD-10-CM | POA: Diagnosis not present

## 2019-10-08 ENCOUNTER — Telehealth: Payer: Self-pay

## 2019-10-08 NOTE — Telephone Encounter (Signed)
Ms. Edrington stated that she took her Gabapentin 100 mg last night and it made her feel loopy and itchy, she did fall a sleep and slept a long time, however when she woke up she felt weak and extremely tired. So much so she could not carry on her daily task. She would like to know should she continue to take the medication or should she stop it. Please advise.

## 2019-10-08 NOTE — Telephone Encounter (Signed)
I recommend holding the gabapentin and follow up with Janett Billow

## 2019-10-10 ENCOUNTER — Other Ambulatory Visit: Payer: Self-pay | Admitting: Gastroenterology

## 2019-10-12 NOTE — Telephone Encounter (Signed)
Patient has stopped the medication and will follow up with Janett Billow

## 2019-10-14 ENCOUNTER — Telehealth: Payer: Self-pay

## 2019-10-14 NOTE — Telephone Encounter (Signed)
Called patient and left a VM to call the office to schedule an appointment.

## 2019-10-14 NOTE — Telephone Encounter (Signed)
Please schedule an appointment for evaluation of symptoms.

## 2019-10-14 NOTE — Telephone Encounter (Signed)
Thank you :)

## 2019-10-14 NOTE — Telephone Encounter (Signed)
Patient called and stated the gabapentin is not helping with her pain and she is not sleeping. She states a friend has the same issue and takes ropinirole.  She is requesting an Rx for ropinirole.

## 2019-10-18 ENCOUNTER — Telehealth: Payer: Self-pay | Admitting: *Deleted

## 2019-10-18 NOTE — Telephone Encounter (Signed)
Patient did not return call, but did call back and spoke with Rodena Piety. She was scheduled for a televisit per [place] on 10/19/19.

## 2019-10-18 NOTE — Telephone Encounter (Signed)
We will need to schedule an appt to discuss this before prescribing new medications (can be a virtual visit) Thank you

## 2019-10-18 NOTE — Telephone Encounter (Signed)
Appointment scheduled for tomorrow with Janett Billow for [employer]

## 2019-10-18 NOTE — Telephone Encounter (Signed)
Patient called and stated that the Gabapentin that was prescribed she has a reaction to. Stated that it made her itch all over. She stopped it. Stated that her friend is taking Ropinirole and stated that it works well. Patient is wanting to try it instead of the Gabapentin.  Please Advise.     OV Note Dated 10/04/2019: Assessment/Plan 1. Neuropathy -normal sensation with pinprick to hands and feet with good strength bilaterally to upper and lower extremities. Noted DDD on multiple levels to lumbar spine on xray from 2019. Will start gabapentin at this time to see if this helps improve symptoms. Nyjae Hodge need further imaging but she would like to hold off on this at this time. Discussed warning signs and when to seek immediate attention  - gabapentin (NEURONTIN) 100 MG capsule; Take 1 capsule (100 mg total) by mouth at bedtime.  Dispense: 30 capsule; Refill: 1

## 2019-10-19 ENCOUNTER — Telehealth: Payer: Self-pay

## 2019-10-19 ENCOUNTER — Telehealth (INDEPENDENT_AMBULATORY_CARE_PROVIDER_SITE_OTHER): Payer: PPO | Admitting: Nurse Practitioner

## 2019-10-19 ENCOUNTER — Other Ambulatory Visit: Payer: Self-pay

## 2019-10-19 DIAGNOSIS — G8929 Other chronic pain: Secondary | ICD-10-CM | POA: Diagnosis not present

## 2019-10-19 DIAGNOSIS — M545 Low back pain, unspecified: Secondary | ICD-10-CM

## 2019-10-19 DIAGNOSIS — G629 Polyneuropathy, unspecified: Secondary | ICD-10-CM

## 2019-10-19 MED ORDER — DULOXETINE HCL 20 MG PO CPEP: 20.0000 mg | ORAL_CAPSULE | Freq: Every day | ORAL | 1 refills | Status: DC

## 2019-10-19 NOTE — Progress Notes (Signed)
This service is provided via telemedicine  No vital signs collected/recorded due to the encounter was a telemedicine visit.   Location of patient (ex: home, work):  Home  Patient consents to a telephone visit:  Yes, see telephone encounter dated 10/19/2019  Location of the provider (ex: office, home):  [place]  Name of any referring provider:  N/A  Names of all persons participating in the telemedicine service and their role in the encounter:  Sherrie Mustache, Nurse Practitioner, [name], CMA, and patient.   Time spent on call:  7 minutes with medical assistant      Careteam: Patient Care Team: Lauree Chandler, NP as PCP - General (Nurse Practitioner)  PLACE OF SERVICE:  [place]  Advanced Directive information    Allergies  Allergen Reactions  . Ace Inhibitors     cough  . Amoxicillin Itching    REACTION: unknown? pain in right kidney  . Benicar Hct [Olmesartan Medoxomil-Hctz]     Extreme weakness  . Budesonide-Formoterol Fumarate     Causes tremors and numbness  . Chlorzoxazone [Chlorzoxazone]   . Citalopram Hydrobromide     REACTION: hives  . Citalopram Hydrobromide   . Clonidine Derivatives   . Droperidol     REACTION: hives  . Flexeril [Cyclobenzaprine Hcl]   . Fluconazole Nausea And Vomiting and Other (See Comments)    fatigue  . Gabapentin Itching  . Ketorolac Tromethamine     REACTION: hives  . Ketorolac Tromethamine   . Metoclopramide Hcl   . Mometasone Furo-Formoterol Fum     Causes sore throat, blurred vision and unable to sleep--started on med 09-17-10  . Morphine     REACTION: hives and itching  . Olmesartan Medoxomil     REACTION: fatique  . Breo Ellipta [Fluticasone Furoate-Vilanterol] Itching    Pt reports itching with Memory Dance is related to being lactose intolerant.     Chief Complaint  Patient presents with  . Acute Visit    Discuss medication for neuropathy Ropinrole. She had an allergic reaction to  gabapentin.     HPI: Patient is a 79 y.o. female to discuss neuropathy.  She was started on gabapentin and had bad itching then with weakness and fatigue.  Reports overall able to cope with neuropathy until after visit. Now reports this is worsen and having a hard time sleeping at night. Pain is bilateral from calf into toes. Constant ache pain.  Neuropathy going up the leg. Pain 7/10 now. Hard for her to get around with pain. Tylenol has not been effective with pain. She is not on the first floor and feels like a first floor apartment would be more beneficial for her so she did not have to climb the stairs as much.  No worsening back pain but does like to have a pillow behind her lumbar spine to sit comfortable and to sleep.   Has hx of hip pain and cream has been very beneficial.   Review of Systems:  Review of Systems  Constitutional: Negative for chills, fever and malaise/fatigue.  Respiratory: Negative for cough.   Cardiovascular: Negative for chest pain.  Musculoskeletal: Positive for back pain, joint pain, myalgias and neck pain.  Neurological: Positive for tingling. Negative for dizziness, sensory change and headaches.  Psychiatric/Behavioral: Negative for depression. The patient is nervous/anxious and has insomnia (due to pain in her feet).     Past Medical History:  Diagnosis Date  . ALLERGIC RHINITIS   . Anxiety   .  Asthma   . Blood type O+   . Cervical strain, acute   . Cirrhosis ([place])   . Colon polyp 2010   adenoma  . Diverticulosis of colon   . Dizziness   . Esophageal varices ([place])   . Fibromyalgia   . GERD (gastroesophageal reflux disease)   . History of nephrolithiasis   . Hypercholesterolemia    borderline  . Hypertension   . IBS (irritable bowel syndrome)   . Osteoarthritis   . Personal history of allergy to unspecified medicinal agent   . Postconcussion syndrome   . Vitamin D deficiency    Past Surgical History:  Procedure Laterality Date  .  CHOLECYSTECTOMY, LAPAROSCOPIC  2004   for gallstone pancreatitis  . KNEE SURGERY Right   . KNEE SURGERY Left   . THYROID SURGERY     Social History:   reports that she quit smoking about 57 years ago. Her smoking use included cigarettes. She has a 16.00 pack-year smoking history. She has never used smokeless tobacco. She reports that she does not drink alcohol or use drugs.  Family History  Problem Relation Age of Onset  . Breast cancer Mother   . Alzheimer's disease Mother   . Heart disease Mother   . COPD Brother   . Heart disease Sister   . Breast cancer Sister 71  . Alcohol abuse Sister   . Ovarian cancer Paternal Grandmother   . Arthritis Other        Cousins   . COPD Cousin        Maternal side   . Heart attack Maternal Uncle     Medications: Patient's Medications  New Prescriptions   No medications on file  Previous Medications   ACETAMINOPHEN (TYLENOL) 500 MG TABLET    Take 1,000 mg by mouth 2 (two) times a day.   ADVAIR HFA 115-21 MCG/ACT INHALER    TAKE 2 PUFFS BY MOUTH TWICE A DAY   AMLODIPINE (NORVASC) 2.5 MG TABLET    TAKE 1 TABLET BY MOUTH EVERY DAY   BENZONATATE (TESSALON) 100 MG CAPSULE    Take 1 capsule (100 mg total) by mouth 2 (two) times daily as needed for cough.   CALCIUM CARBONATE (TUMS - DOSED IN MG ELEMENTAL CALCIUM) 500 MG CHEWABLE TABLET    Chew 1 tablet by mouth as needed for indigestion or heartburn.   CALCIUM CARBONATE-VITAMIN D (CALCIUM 600+D) 600-400 MG-UNIT TABLET    Take 1 tablet by mouth 2 (two) times daily.   CETIRIZINE HCL (ZYRTEC ALLERGY) 10 MG CAPS    Take 10 mg by mouth daily.    CHOLECALCIFEROL (VITAMIN D3) 1000 UNITS CAPS    Take 1,000 Units by mouth daily.    ESOMEPRAZOLE (NEXIUM) 40 MG CAPSULE    Take 1 tablet by mouth once or twice daily as needed   HYDROCHLOROTHIAZIDE (HYDRODIURIL) 25 MG TABLET    Take 1 tablet (25 mg total) by mouth daily.   KLOR-CON M20 20 MEQ TABLET    TAKE 1 TABLET BY MOUTH EVERY DAY   LOSARTAN (COZAAR) 25 MG  TABLET    TAKE 1 TABLET BY MOUTH EVERY DAY   MECLIZINE (ANTIVERT) 25 MG TABLET    TAKE 1 TABLET BY MOUTH 3 TIMES A DAY AS NEEDED FOR DIZZINESS OR NAUSEA   MULTIPLE VITAMINS-MINERALS (CENTRUM SILVER PO)    Take 1 tablet by mouth daily.    OMEGA-3 FATTY ACIDS (FISH OIL MAXIMUM STRENGTH) 1200 MG CAPS    Take 1,200 mg  by mouth 2 (two) times daily.    PROAIR HFA 108 (90 BASE) MCG/ACT INHALER    INHALE 2 PUFFS EVERY 4 HOURS AS NEEDED INTO THE LUNGS FOR WHEEZING OR SHORTNESS OF BREATH   PROPRANOLOL (INDERAL) 10 MG TABLET    Take 1 tablet (10 mg total) by mouth 2 (two) times daily. You are due for an office visit with Dr. Havery Moros. Please call to schedule. Thank you   PROPYLENE GLYCOL (SYSTANE BALANCE) 0.6 % SOLN    Place 1 drop into both ears 2 (two) times daily.   SALINE NASAL SPRAY NA    Place 1 spray into both nostrils 2 (two) times daily.   Modified Medications   No medications on file  Discontinued Medications   GABAPENTIN (NEURONTIN) 100 MG CAPSULE    Take 1 capsule (100 mg total) by mouth at bedtime.    Physical Exam:  There were no vitals filed for this visit. There is no height or weight on file to calculate BMI. Wt Readings from Last 3 Encounters:  10/04/19 157 lb (71.2 kg)  07/07/19 172 lb (78 kg)  06/23/19 171 lb (77.6 kg)     Labs reviewed: Basic Metabolic Panel: Recent Labs    02/02/19 1122 07/07/19 1113 07/13/19 1217  NA 138 138 135  K 4.1 4.3 3.6  CL 99 101 97  CO2 31 30 30   GLUCOSE 114* 107* 109*  BUN 16 20 14   CREATININE 0.66 0.73 0.72  CALCIUM 10.0 10.1 10.2  TSH 1.69  --   --    Liver Function Tests: Recent Labs    02/02/19 1122 07/07/19 1113 07/13/19 1217  AST 99* 35 43*  ALT 99* 33* 41*  ALKPHOS  --   --  78  BILITOT 0.8 1.0 1.1  PROT 6.7 7.0 7.5  ALBUMIN  --   --  4.5   No results for input(s): LIPASE, AMYLASE in the last 8760 hours. No results for input(s): AMMONIA in the last 8760 hours. CBC: Recent Labs    02/02/19 1122 07/07/19 1113  07/13/19 1217  WBC 7.6 6.6 7.4  NEUTROABS 3,063 1,808 2.2  HGB 14.5 14.8 14.6  HCT 42.2 43.1 42.2  MCV 86.5 88.3 87.7  PLT 296 319 302.0   Lipid Panel: Recent Labs    02/02/19 1122 07/07/19 1113  CHOL 201* 231*  HDL 46* 50  LDLCALC 118* 151*  TRIG 259* 163*  CHOLHDL 4.4 4.6   TSH: Recent Labs    02/02/19 1122  TSH 1.69   A1C: Lab Results  Component Value Date   HGBA1C 5.7 (H) 07/07/2019     Assessment/Plan 1. Neuropathy Ongoing, has been getting worse recently causing her not to be able to sleep, neuropathy is bilateral.  - DG Lumbar Spine Complete; Future due to chronic back pain to rule out spinal cord involvement - DULoxetine (CYMBALTA) 20 MG capsule; Take 1 capsule (20 mg total) by mouth daily.  Dispense: 30 capsule; Refill: 1 to help with pain.  -pain makes it harder for her to navigate stairs. She is wanting a first floor apartment but needs a letter from PCP.   2. Chronic midline low back pain, unspecified whether sciatica present -ongoing, will follow up xray due to worsening neuropathy. - DG Lumbar Spine Complete; Future - DULoxetine (CYMBALTA) 20 MG capsule; Take 1 capsule (20 mg total) by mouth daily.  Dispense: 30 capsule; Refill: 1  Next appt: 11/05/2019, sooner if needed  Janett Billow K. Harle Battiest  [facility] [phone]    Virtual Visit via Telephone Note  I connected with pt on 10/19/19 at  2:45 PM EDT by telephone and verified that I am speaking with the correct person using two identifiers.  Location: Patient: home Provider: [facility]    I discussed the limitations, risks, security and privacy concerns of performing an evaluation and management service by telephone and the availability of in person appointments. I also discussed with the patient that there may be a patient responsible charge related to this service. The patient expressed understanding and agreed to proceed.   I discussed the assessment and  treatment plan with the patient. The patient was provided an opportunity to ask questions and all were answered. The patient agreed with the plan and demonstrated an understanding of the instructions.   The patient was advised to call back or seek an in-person evaluation if the symptoms worsen or if the condition fails to improve as anticipated.  I provided 17 minutes of non-face-to-face time during this encounter.  Carlos American. Harle Battiest Avs printed and mailed

## 2019-10-19 NOTE — Telephone Encounter (Signed)
Ms. [name], [name] are scheduled for a virtual visit with your provider today.    Just as we do with appointments in the office, we must obtain your consent to participate.  Your consent will be active for this visit and any virtual visit you may have with one of our providers in the next 365 days.    If you have a MyChart account, I can also send a copy of this consent to you electronically.  All virtual visits are billed to your insurance company just like a traditional visit in the office.  As this is a virtual visit, video technology does not allow for your provider to perform a traditional examination.  This may limit your provider's ability to fully assess your condition.  If your provider identifies any concerns that need to be evaluated in person or the need to arrange testing such as labs, EKG, etc, we will make arrangements to do so.    Although advances in technology are sophisticated, we cannot ensure that it will always work on either your end or our end.  If the connection with a video visit is poor, we may have to switch to a telephone visit.  With either a video or telephone visit, we are not always able to ensure that we have a secure connection.   I need to obtain your verbal consent now.   Are you willing to proceed with your visit today?   [name] has provided verbal consent on 10/19/2019 for a virtual visit (video or telephone).   [name], CMA 10/19/2019  1:23 PM

## 2019-10-22 ENCOUNTER — Other Ambulatory Visit: Payer: Self-pay

## 2019-10-22 ENCOUNTER — Ambulatory Visit
Admission: RE | Admit: 2019-10-22 | Discharge: 2019-10-22 | Disposition: A | Discharge status: Home / Self Care | Payer: PPO | Source: Ambulatory Visit | Attending: Nurse Practitioner | Admitting: Nurse Practitioner

## 2019-10-22 ENCOUNTER — Other Ambulatory Visit: Payer: Self-pay | Admitting: Nurse Practitioner

## 2019-10-22 ENCOUNTER — Other Ambulatory Visit: Payer: PPO

## 2019-10-22 DIAGNOSIS — M545 Low back pain, unspecified: Secondary | ICD-10-CM

## 2019-10-22 DIAGNOSIS — E785 Hyperlipidemia, unspecified: Secondary | ICD-10-CM

## 2019-10-22 DIAGNOSIS — G629 Polyneuropathy, unspecified: Secondary | ICD-10-CM

## 2019-10-22 DIAGNOSIS — G8929 Other chronic pain: Secondary | ICD-10-CM

## 2019-10-23 LAB — COMPLETE METABOLIC PANEL WITH GFR
AG Ratio: 2 (calc) (ref 1.0–2.5)
ALT: 18 U/L (ref 6–29)
AST: 27 U/L (ref 10–35)
Albumin: 4.3 g/dL (ref 3.6–5.1)
Alkaline phosphatase (APISO): 82 U/L (ref 37–153)
BUN: 16 mg/dL (ref 7–25)
CO2: 25 mmol/L (ref 20–32)
Calcium: 9.6 mg/dL (ref 8.6–10.4)
Chloride: 96 mmol/L — ABNORMAL LOW (ref 98–110)
Creat: 0.64 mg/dL (ref 0.60–0.88)
GFR, Est African American: 97 mL/min/{1.73_m2} (ref >=60)
GFR, Est Non African American: 84 mL/min/{1.73_m2} (ref >=60)
Globulin: 2.2 g/dL (calc) (ref 1.9–3.7)
Glucose, Bld: 103 mg/dL — ABNORMAL HIGH (ref 65–99)
Potassium: 3.8 mmol/L (ref 3.5–5.3)
Sodium: 132 mmol/L — ABNORMAL LOW (ref 135–146)
Total Bilirubin: 1.2 mg/dL (ref 0.2–1.2)
Total Protein: 6.5 g/dL (ref 6.1–8.1)

## 2019-10-23 LAB — LIPID PANEL
Cholesterol: 230 mg/dL — ABNORMAL HIGH (ref <200)
HDL: 49 mg/dL — ABNORMAL LOW (ref >=50)
LDL Cholesterol (Calc): 156 mg/dL (calc) — ABNORMAL HIGH
Non-HDL Cholesterol (Calc): 181 mg/dL (calc) — ABNORMAL HIGH (ref <130)
Total CHOL/HDL Ratio: 4.7 (calc) (ref <5.0)
Triglycerides: 130 mg/dL (ref <150)

## 2019-10-25 ENCOUNTER — Other Ambulatory Visit: Payer: Self-pay

## 2019-10-25 DIAGNOSIS — I10 Essential (primary) hypertension: Secondary | ICD-10-CM

## 2019-10-28 DIAGNOSIS — Z961 Presence of intraocular lens: Secondary | ICD-10-CM | POA: Diagnosis not present

## 2019-10-28 DIAGNOSIS — H524 Presbyopia: Secondary | ICD-10-CM | POA: Diagnosis not present

## 2019-10-28 DIAGNOSIS — H5203 Hypermetropia, bilateral: Secondary | ICD-10-CM | POA: Diagnosis not present

## 2019-11-04 ENCOUNTER — Encounter: Payer: Self-pay | Admitting: Nurse Practitioner

## 2019-11-04 ENCOUNTER — Ambulatory Visit (INDEPENDENT_AMBULATORY_CARE_PROVIDER_SITE_OTHER): Payer: PPO | Admitting: Nurse Practitioner

## 2019-11-04 ENCOUNTER — Other Ambulatory Visit: Payer: Self-pay

## 2019-11-04 VITALS — BP 130/90 | HR 65 | Temp 97.6°F | Ht 61.0 in | Wt 153.8 lb

## 2019-11-04 DIAGNOSIS — G8929 Other chronic pain: Secondary | ICD-10-CM

## 2019-11-04 DIAGNOSIS — I8500 Esophageal varices without bleeding: Secondary | ICD-10-CM | POA: Diagnosis not present

## 2019-11-04 DIAGNOSIS — I10 Essential (primary) hypertension: Secondary | ICD-10-CM | POA: Diagnosis not present

## 2019-11-04 DIAGNOSIS — J302 Other seasonal allergic rhinitis: Secondary | ICD-10-CM | POA: Diagnosis not present

## 2019-11-04 DIAGNOSIS — G629 Polyneuropathy, unspecified: Secondary | ICD-10-CM | POA: Diagnosis not present

## 2019-11-04 DIAGNOSIS — J3089 Other allergic rhinitis: Secondary | ICD-10-CM

## 2019-11-04 DIAGNOSIS — E871 Hypo-osmolality and hyponatremia: Secondary | ICD-10-CM | POA: Diagnosis not present

## 2019-11-04 DIAGNOSIS — K219 Gastro-esophageal reflux disease without esophagitis: Secondary | ICD-10-CM

## 2019-11-04 DIAGNOSIS — M545 Low back pain, unspecified: Secondary | ICD-10-CM

## 2019-11-04 DIAGNOSIS — E785 Hyperlipidemia, unspecified: Secondary | ICD-10-CM | POA: Diagnosis not present

## 2019-11-04 DIAGNOSIS — J4520 Mild intermittent asthma, uncomplicated: Secondary | ICD-10-CM | POA: Diagnosis not present

## 2019-11-04 NOTE — Progress Notes (Signed)
Careteam: Patient Care Team: Lauree Chandler, NP as PCP - General (Nurse Practitioner)  PLACE OF SERVICE:  [place] Directive information Does Patient Have a Medical Advance Directive?: Yes, Type of Advance Directive: Out of facility DNR (pink MOST or yellow form), Pre-existing out of facility DNR order (yellow form or pink MOST form): Yellow form placed in chart (order not valid for inpatient use), Does patient want to make changes to medical advance directive?: No - Patient declined  Allergies  Allergen Reactions  . Ace Inhibitors     cough  . Amoxicillin Itching    REACTION: unknown? pain in right kidney  . Benicar Hct [Olmesartan Medoxomil-Hctz]     Extreme weakness  . Budesonide-Formoterol Fumarate     Causes tremors and numbness  . Chlorzoxazone [Chlorzoxazone]   . Citalopram Hydrobromide     REACTION: hives  . Citalopram Hydrobromide   . Clonidine Derivatives   . Droperidol     REACTION: hives  . Flexeril [Cyclobenzaprine Hcl]   . Fluconazole Nausea And Vomiting and Other (See Comments)    fatigue  . Gabapentin Itching  . Ketorolac Tromethamine     REACTION: hives  . Ketorolac Tromethamine   . Metoclopramide Hcl   . Mometasone Furo-Formoterol Fum     Causes sore throat, blurred vision and unable to sleep--started on med 09-17-10  . Morphine     REACTION: hives and itching  . Olmesartan Medoxomil     REACTION: fatique  . Breo Ellipta [Fluticasone Furoate-Vilanterol] Itching    Pt reports itching with Memory Dance is related to being lactose intolerant.     Chief Complaint  Patient presents with  . Medical Management of Chronic Issues    4 Month follow up. Neuropathy concerns, Patient states the Cymbalta upset her stomach very bad.     HPI: Patient is a 79 y.o. female for routine follow up.  Pt continues with dietary modifications and increase in physical activity for weight reduction. Has increased her walking up to 35 mins a day. Continues to use  a cane.   Neuropathy-ongoing, could not tolerate gabapentin or cymbalta due to side effects   GERD- on nexium- not needing tums   Hypertension- controlled on propranolol, losartan 25 mg, amlodipine and hctz  Non-alcoholic cirrhosis of the liver- continues to follow up with GI, continues on propranolol-   Asthma/allergies- controlled at this time. Continues on advair, use albuterol twice monthly.   Osteopenia- scheduled for bone density in august- continue on cal and vit d  Had low sodium on last labs- reports she was having diarrhea from starting Cymbalta has resolved at this time.   Hyperlipidemia- LDL elevated, reports she has made dietary changes, does not wish to be on medication.   Review of Systems:  Review of Systems  Constitutional: Negative for chills, fever and weight loss.  HENT: Negative for hearing loss and tinnitus.   Respiratory: Negative for cough, sputum production and shortness of breath.   Cardiovascular: Negative for chest pain, palpitations and leg swelling.  Gastrointestinal: Negative for abdominal pain, constipation, diarrhea and heartburn.  Genitourinary: Negative for dysuria, frequency and urgency.  Musculoskeletal: Negative for back pain, falls, joint pain and myalgias.  Skin: Negative.   Neurological: Positive for tingling. Negative for dizziness and headaches.  Psychiatric/Behavioral: Negative for depression and memory loss. The patient does not have insomnia.     Past Medical History:  Diagnosis Date  . ALLERGIC RHINITIS   . Anxiety   . Asthma   .  Blood type O+   . Cervical strain, acute   . Cirrhosis ([place])   . Colon polyp 2010   adenoma  . Diverticulosis of colon   . Dizziness   . Esophageal varices ([place])   . Fibromyalgia   . GERD (gastroesophageal reflux disease)   . History of nephrolithiasis   . Hypercholesterolemia    borderline  . Hypertension   . IBS (irritable bowel syndrome)   . Osteoarthritis   . Personal history of allergy  to unspecified medicinal agent   . Postconcussion syndrome   . Vitamin D deficiency    Past Surgical History:  Procedure Laterality Date  . CHOLECYSTECTOMY, LAPAROSCOPIC  2004   for gallstone pancreatitis  . KNEE SURGERY Right   . KNEE SURGERY Left   . THYROID SURGERY     Social History:   reports that she quit smoking about 57 years ago. Her smoking use included cigarettes. She has a 16.00 pack-year smoking history. She has never used smokeless tobacco. She reports that she does not drink alcohol and does not use drugs.  Family History  Problem Relation Age of Onset  . Breast cancer Mother   . Alzheimer's disease Mother   . Heart disease Mother   . COPD Brother   . Heart disease Sister   . Breast cancer Sister 58  . Alcohol abuse Sister   . Ovarian cancer Paternal Grandmother   . Arthritis Other        Cousins   . COPD Cousin        Maternal side   . Heart attack Maternal Uncle     Medications: Patient's Medications  New Prescriptions   No medications on file  Previous Medications   ACETAMINOPHEN (TYLENOL) 500 MG TABLET    Take 1,000 mg by mouth 2 (two) times a day.   ADVAIR HFA 115-21 MCG/ACT INHALER    TAKE 2 PUFFS BY MOUTH TWICE A DAY   AMLODIPINE (NORVASC) 2.5 MG TABLET    TAKE 1 TABLET BY MOUTH EVERY DAY   BENZONATATE (TESSALON) 100 MG CAPSULE    Take 1 capsule (100 mg total) by mouth 2 (two) times daily as needed for cough.   CALCIUM CARBONATE (TUMS - DOSED IN MG ELEMENTAL CALCIUM) 500 MG CHEWABLE TABLET    Chew 1 tablet by mouth as needed for indigestion or heartburn.   CALCIUM CARBONATE-VITAMIN D (CALCIUM 600+D) 600-400 MG-UNIT TABLET    Take 1 tablet by mouth 2 (two) times daily.   CETIRIZINE HCL (ZYRTEC ALLERGY) 10 MG CAPS    Take 10 mg by mouth daily.    CHOLECALCIFEROL (VITAMIN D3) 1000 UNITS CAPS    Take 1,000 Units by mouth daily.    ESOMEPRAZOLE (NEXIUM) 40 MG CAPSULE    Take 1 tablet by mouth once or twice daily as needed   HYDROCHLOROTHIAZIDE  (HYDRODIURIL) 25 MG TABLET    Take 1 tablet (25 mg total) by mouth daily.   KLOR-CON M20 20 MEQ TABLET    TAKE 1 TABLET BY MOUTH EVERY DAY   LOSARTAN (COZAAR) 25 MG TABLET    TAKE 1 TABLET BY MOUTH EVERY DAY   MECLIZINE (ANTIVERT) 25 MG TABLET    TAKE 1 TABLET BY MOUTH 3 TIMES A DAY AS NEEDED FOR DIZZINESS OR NAUSEA   MULTIPLE VITAMINS-MINERALS (CENTRUM SILVER PO)    Take 1 tablet by mouth daily.    OMEGA-3 FATTY ACIDS (FISH OIL MAXIMUM STRENGTH) 1200 MG CAPS    Take 1,200 mg by mouth  2 (two) times daily.    PROAIR HFA 108 (90 BASE) MCG/ACT INHALER    INHALE 2 PUFFS EVERY 4 HOURS AS NEEDED INTO THE LUNGS FOR WHEEZING OR SHORTNESS OF BREATH   PROPRANOLOL (INDERAL) 10 MG TABLET    Take 1 tablet (10 mg total) by mouth 2 (two) times daily. You are due for an office visit with Dr. Havery Moros. Please call to schedule. Thank you   PROPYLENE GLYCOL (SYSTANE BALANCE) 0.6 % SOLN    Place 1 drop into both ears 2 (two) times daily.   SALINE NASAL SPRAY NA    Place 1 spray into both nostrils 2 (two) times daily.   Modified Medications   No medications on file  Discontinued Medications   DULOXETINE (CYMBALTA) 20 MG CAPSULE    Take 1 capsule (20 mg total) by mouth daily.    Physical Exam:  Vitals:   11/04/19 1524  BP: 130/90  Pulse: 65  Temp: 97.6 F (36.4 C)  TempSrc: Temporal  SpO2: 96%  Weight: 153 lb 12.8 oz (69.8 kg)  Height: 5' 1"  (1.549 m)   Body mass index is 29.06 kg/m. Wt Readings from Last 3 Encounters:  11/04/19 153 lb 12.8 oz (69.8 kg)  10/04/19 157 lb (71.2 kg)  07/07/19 172 lb (78 kg)    Physical Exam Constitutional:      General: She is not in acute distress.    Appearance: She is well-developed. She is not diaphoretic.  HENT:     Head: Normocephalic and atraumatic.     Mouth/Throat:     Pharynx: No oropharyngeal exudate.  Eyes:     Conjunctiva/sclera: Conjunctivae normal.     Pupils: Pupils are equal, round, and reactive to light.  Cardiovascular:     Rate and  Rhythm: Normal rate and regular rhythm.     Heart sounds: Normal heart sounds.  Pulmonary:     Effort: Pulmonary effort is normal.     Breath sounds: Normal breath sounds.  Abdominal:     General: Bowel sounds are normal.     Palpations: Abdomen is soft.  Musculoskeletal:        General: No tenderness.     Cervical back: Normal range of motion and neck supple.  Skin:    General: Skin is warm and dry.  Neurological:     Mental Status: She is alert and oriented to person, place, and time. Mental status is at baseline.     Comments: Walks with cane  Psychiatric:        Mood and Affect: Mood normal.        Behavior: Behavior normal.    Labs reviewed: Basic Metabolic Panel: Recent Labs    02/02/19 1122 02/02/19 1122 07/07/19 1113 07/13/19 1217 10/22/19 0904  NA 138   < > 138 135 132*  K 4.1   < > 4.3 3.6 3.8  CL 99   < > 101 97 96*  CO2 31   < > 30 30 25   GLUCOSE 114*   < > 107* 109* 103*  BUN 16   < > 20 14 16   CREATININE 0.66   < > 0.73 0.72 0.64  CALCIUM 10.0   < > 10.1 10.2 9.6  TSH 1.69  --   --   --   --    < > = values in this interval not displayed.   Liver Function Tests: Recent Labs    07/07/19 1113 07/13/19 1217 10/22/19 0904  AST 35 43* 27  ALT 33* 41* 18  ALKPHOS  --  78  --   BILITOT 1.0 1.1 1.2  PROT 7.0 7.5 6.5  ALBUMIN  --  4.5  --    No results for input(s): LIPASE, AMYLASE in the last 8760 hours. No results for input(s): AMMONIA in the last 8760 hours. CBC: Recent Labs    02/02/19 1122 07/07/19 1113 07/13/19 1217  WBC 7.6 6.6 7.4  NEUTROABS 3,063 1,808 2.2  HGB 14.5 14.8 14.6  HCT 42.2 43.1 42.2  MCV 86.5 88.3 87.7  PLT 296 319 302.0   Lipid Panel: Recent Labs    02/02/19 1122 07/07/19 1113 10/22/19 0904  CHOL 201* 231* 230*  HDL 46* 50 49*  LDLCALC 118* 151* 156*  TRIG 259* 163* 130  CHOLHDL 4.4 4.6 4.7   TSH: Recent Labs    02/02/19 1122  TSH 1.69   A1C: Lab Results  Component Value Date   HGBA1C 5.7 (H)  07/07/2019     Assessment/Plan 1. Low sodium levels Likely due to diarrhea, has resolved, will follow up - [place] GFR  2. Essential hypertension -stable on propranolol, hctz, losartan, and norvasc   3. Dyslipidemia LDL unchanged, she continues to work on diet and increase in physical activity. She does not wish to start medications.  4. Neuropathy Ongoing, does not tolerate medication, continues to walk which overall she feels like has been beneficial and now with decrease in symptoms  5. Chronic midline low back pain, unspecified whether sciatica present Stable, continues with weight loss and increase in physical activity which has helped the pain.  6. Esophageal varices without bleeding, unspecified esophageal varices type ([place]) Stable, followed by GI, needs to make follow up, no signs of bleeding. CBC stable in March  7. Seasonal and perennial allergic rhinitis Stable, without worsening of symptoms. Continues on cetirizine  8. Asthma, allergic, mild intermittent, uncomplicated Stable, no recent flair, has not needed albuterol recently. Continues on advair twice daily  9. Gastroesophageal reflux disease without esophagitis Improved symptoms with weight loss, continues on nexium. Has not needed tums.   Next appt: 6 months, sooner if needed Kingsley Herandez K. [place], [facility] [phone]

## 2019-11-05 ENCOUNTER — Ambulatory Visit: Payer: PPO | Admitting: Nurse Practitioner

## 2019-11-05 LAB — BASIC METABOLIC PANEL WITH GFR
BUN: 19 mg/dL (ref 7–25)
CO2: 28 mmol/L (ref 20–32)
Calcium: 9.7 mg/dL (ref 8.6–10.4)
Chloride: 99 mmol/L (ref 98–110)
Creat: 0.63 mg/dL (ref 0.60–0.88)
GFR, Est African American: 97 mL/min/{1.73_m2} (ref >=60)
GFR, Est Non African American: 84 mL/min/{1.73_m2} (ref >=60)
Glucose, Bld: 98 mg/dL (ref 65–99)
Potassium: 3.9 mmol/L (ref 3.5–5.3)
Sodium: 136 mmol/L (ref 135–146)

## 2019-11-12 ENCOUNTER — Other Ambulatory Visit: Payer: PPO

## 2019-11-16 ENCOUNTER — Other Ambulatory Visit: Payer: Self-pay | Admitting: Nurse Practitioner

## 2019-11-16 DIAGNOSIS — I10 Essential (primary) hypertension: Secondary | ICD-10-CM

## 2019-11-19 ENCOUNTER — Other Ambulatory Visit: Payer: Self-pay

## 2019-11-19 ENCOUNTER — Emergency Department (HOSPITAL_COMMUNITY): Payer: PPO

## 2019-11-19 ENCOUNTER — Encounter (HOSPITAL_COMMUNITY): Payer: Self-pay | Admitting: Emergency Medicine

## 2019-11-19 ENCOUNTER — Ambulatory Visit: Payer: PPO | Admitting: Family

## 2019-11-19 ENCOUNTER — Inpatient Hospital Stay (HOSPITAL_COMMUNITY)
Admission: EM | Admit: 2019-11-19 | Discharge: 2019-11-27 | DRG: 372 | Disposition: A | Discharge status: Home / Self Care | Payer: PPO | Attending: Internal Medicine | Admitting: Internal Medicine

## 2019-11-19 DIAGNOSIS — R509 Fever, unspecified: Secondary | ICD-10-CM

## 2019-11-19 DIAGNOSIS — I8500 Esophageal varices without bleeding: Secondary | ICD-10-CM | POA: Diagnosis present

## 2019-11-19 DIAGNOSIS — E861 Hypovolemia: Secondary | ICD-10-CM | POA: Diagnosis not present

## 2019-11-19 DIAGNOSIS — Z66 Do not resuscitate: Secondary | ICD-10-CM | POA: Diagnosis present

## 2019-11-19 DIAGNOSIS — Z885 Allergy status to narcotic agent status: Secondary | ICD-10-CM

## 2019-11-19 DIAGNOSIS — Z79899 Other long term (current) drug therapy: Secondary | ICD-10-CM

## 2019-11-19 DIAGNOSIS — N390 Urinary tract infection, site not specified: Secondary | ICD-10-CM | POA: Diagnosis present

## 2019-11-19 DIAGNOSIS — R188 Other ascites: Secondary | ICD-10-CM | POA: Diagnosis not present

## 2019-11-19 DIAGNOSIS — R197 Diarrhea, unspecified: Secondary | ICD-10-CM | POA: Diagnosis not present

## 2019-11-19 DIAGNOSIS — Z888 Allergy status to other drugs, medicaments and biological substances status: Secondary | ICD-10-CM

## 2019-11-19 DIAGNOSIS — K219 Gastro-esophageal reflux disease without esophagitis: Secondary | ICD-10-CM | POA: Diagnosis present

## 2019-11-19 DIAGNOSIS — E7800 Pure hypercholesterolemia, unspecified: Secondary | ICD-10-CM | POA: Diagnosis present

## 2019-11-19 DIAGNOSIS — Z20822 Contact with and (suspected) exposure to covid-19: Secondary | ICD-10-CM | POA: Diagnosis not present

## 2019-11-19 DIAGNOSIS — R110 Nausea: Secondary | ICD-10-CM

## 2019-11-19 DIAGNOSIS — R112 Nausea with vomiting, unspecified: Secondary | ICD-10-CM | POA: Diagnosis not present

## 2019-11-19 DIAGNOSIS — I361 Nonrheumatic tricuspid (valve) insufficiency: Secondary | ICD-10-CM | POA: Diagnosis not present

## 2019-11-19 DIAGNOSIS — K589 Irritable bowel syndrome without diarrhea: Secondary | ICD-10-CM | POA: Diagnosis not present

## 2019-11-19 DIAGNOSIS — I8510 Secondary esophageal varices without bleeding: Secondary | ICD-10-CM | POA: Diagnosis not present

## 2019-11-19 DIAGNOSIS — Z87891 Personal history of nicotine dependence: Secondary | ICD-10-CM

## 2019-11-19 DIAGNOSIS — M797 Fibromyalgia: Secondary | ICD-10-CM | POA: Diagnosis present

## 2019-11-19 DIAGNOSIS — Z881 Allergy status to other antibiotic agents status: Secondary | ICD-10-CM

## 2019-11-19 DIAGNOSIS — I4891 Unspecified atrial fibrillation: Secondary | ICD-10-CM | POA: Diagnosis not present

## 2019-11-19 DIAGNOSIS — Z9049 Acquired absence of other specified parts of digestive tract: Secondary | ICD-10-CM | POA: Diagnosis not present

## 2019-11-19 DIAGNOSIS — E876 Hypokalemia: Secondary | ICD-10-CM | POA: Diagnosis not present

## 2019-11-19 DIAGNOSIS — E785 Hyperlipidemia, unspecified: Secondary | ICD-10-CM | POA: Diagnosis not present

## 2019-11-19 DIAGNOSIS — D72829 Elevated white blood cell count, unspecified: Secondary | ICD-10-CM | POA: Diagnosis not present

## 2019-11-19 DIAGNOSIS — A045 Campylobacter enteritis: Secondary | ICD-10-CM | POA: Diagnosis not present

## 2019-11-19 DIAGNOSIS — A0472 Enterocolitis due to Clostridium difficile, not specified as recurrent: Secondary | ICD-10-CM | POA: Diagnosis not present

## 2019-11-19 DIAGNOSIS — A09 Infectious gastroenteritis and colitis, unspecified: Secondary | ICD-10-CM | POA: Diagnosis not present

## 2019-11-19 DIAGNOSIS — K7581 Nonalcoholic steatohepatitis (NASH): Secondary | ICD-10-CM | POA: Diagnosis present

## 2019-11-19 DIAGNOSIS — E860 Dehydration: Secondary | ICD-10-CM | POA: Diagnosis not present

## 2019-11-19 DIAGNOSIS — F419 Anxiety disorder, unspecified: Secondary | ICD-10-CM | POA: Diagnosis not present

## 2019-11-19 DIAGNOSIS — I10 Essential (primary) hypertension: Secondary | ICD-10-CM | POA: Diagnosis not present

## 2019-11-19 DIAGNOSIS — R531 Weakness: Secondary | ICD-10-CM | POA: Diagnosis not present

## 2019-11-19 DIAGNOSIS — E871 Hypo-osmolality and hyponatremia: Secondary | ICD-10-CM | POA: Diagnosis present

## 2019-11-19 DIAGNOSIS — Z86010 Personal history of colonic polyps: Secondary | ICD-10-CM | POA: Diagnosis not present

## 2019-11-19 DIAGNOSIS — E559 Vitamin D deficiency, unspecified: Secondary | ICD-10-CM | POA: Diagnosis present

## 2019-11-19 DIAGNOSIS — K7469 Other cirrhosis of liver: Secondary | ICD-10-CM | POA: Diagnosis not present

## 2019-11-19 DIAGNOSIS — Z87442 Personal history of urinary calculi: Secondary | ICD-10-CM

## 2019-11-19 DIAGNOSIS — J309 Allergic rhinitis, unspecified: Secondary | ICD-10-CM | POA: Diagnosis not present

## 2019-11-19 DIAGNOSIS — R109 Unspecified abdominal pain: Secondary | ICD-10-CM

## 2019-11-19 LAB — COMPREHENSIVE METABOLIC PANEL
ALT: 18 U/L (ref 0–44)
AST: 24 U/L (ref 15–41)
Albumin: 3.7 g/dL (ref 3.5–5.0)
Alkaline Phosphatase: 59 U/L (ref 38–126)
Anion gap: 10 (ref 5–15)
BUN: 11 mg/dL (ref 8–23)
CO2: 25 mmol/L (ref 22–32)
Calcium: 8.7 mg/dL — ABNORMAL LOW (ref 8.9–10.3)
Chloride: 94 mmol/L — ABNORMAL LOW (ref 98–111)
Creatinine, Ser: 0.79 mg/dL (ref 0.44–1.00)
GFR calc Af Amer: >60 mL/min (ref >60)
GFR calc non Af Amer: >60 mL/min (ref >60)
Glucose, Bld: 151 mg/dL — ABNORMAL HIGH (ref 70–99)
Potassium: 3.5 mmol/L (ref 3.5–5.1)
Sodium: 129 mmol/L — ABNORMAL LOW (ref 135–145)
Total Bilirubin: 1 mg/dL (ref 0.3–1.2)
Total Protein: 6.3 g/dL — ABNORMAL LOW (ref 6.5–8.1)

## 2019-11-19 LAB — URINALYSIS, ROUTINE W REFLEX MICROSCOPIC
Bilirubin Urine: NEGATIVE
Glucose, UA: NEGATIVE mg/dL
Hgb urine dipstick: NEGATIVE
Ketones, ur: 5 mg/dL — AB
Nitrite: NEGATIVE
Protein, ur: NEGATIVE mg/dL
Specific Gravity, Urine: 1.009 (ref 1.005–1.030)
pH: 5 (ref 5.0–8.0)

## 2019-11-19 LAB — CBC
HCT: 38.3 % (ref 36.0–46.0)
Hemoglobin: 13.2 g/dL (ref 12.0–15.0)
MCH: 30.1 pg (ref 26.0–34.0)
MCHC: 34.5 g/dL (ref 30.0–36.0)
MCV: 87.2 fL (ref 80.0–100.0)
Platelets: 244 10*3/uL (ref 150–400)
RBC: 4.39 MIL/uL (ref 3.87–5.11)
RDW: 12.3 % (ref 11.5–15.5)
WBC: 11.9 10*3/uL — ABNORMAL HIGH (ref 4.0–10.5)
nRBC: 0 % (ref 0.0–0.2)

## 2019-11-19 LAB — LACTIC ACID, PLASMA
Lactic Acid, Venous: 1.2 mmol/L (ref 0.5–1.9)
Lactic Acid, Venous: 1.1 mmol/L (ref 0.5–1.9)

## 2019-11-19 LAB — SARS CORONAVIRUS 2 BY RT PCR (HOSPITAL ORDER, PERFORMED IN ~~LOC~~ HOSPITAL LAB): SARS Coronavirus 2: NEGATIVE

## 2019-11-19 LAB — LIPASE, BLOOD: Lipase: 35 U/L (ref 11–51)

## 2019-11-19 MED ORDER — ONDANSETRON HCL 4 MG/2ML IJ SOLN
4.0000 mg | Freq: Once | INTRAMUSCULAR | Status: AC
  Administered 2019-11-19: 4 mg via INTRAVENOUS
  Filled 2019-11-19: qty 2

## 2019-11-19 MED ORDER — ONDANSETRON HCL 4 MG/2ML IJ SOLN
4.0000 mg | Freq: Four times a day (QID) | INTRAMUSCULAR | Status: DC | PRN
  Administered 2019-11-24: 4 mg via INTRAVENOUS
  Filled 2019-11-19: qty 2

## 2019-11-19 MED ORDER — LOSARTAN POTASSIUM 25 MG PO TABS
25.0000 mg | ORAL_TABLET | Freq: Every day | ORAL | Status: DC
  Administered 2019-11-20 – 2019-11-27 (×8): 25 mg via ORAL
  Filled 2019-11-19 (×8): qty 1

## 2019-11-19 MED ORDER — SODIUM CHLORIDE 0.9 % IV SOLN
1.0000 g | Freq: Once | INTRAVENOUS | Status: AC
  Administered 2019-11-19: 1 g via INTRAVENOUS
  Filled 2019-11-19: qty 10

## 2019-11-19 MED ORDER — AMLODIPINE BESYLATE 2.5 MG PO TABS
2.5000 mg | ORAL_TABLET | Freq: Every day | ORAL | Status: DC
  Administered 2019-11-20 – 2019-11-26 (×7): 2.5 mg via ORAL
  Filled 2019-11-19 (×7): qty 1

## 2019-11-19 MED ORDER — PROPRANOLOL HCL 10 MG PO TABS
10.0000 mg | ORAL_TABLET | Freq: Two times a day (BID) | ORAL | Status: DC
  Administered 2019-11-19 – 2019-11-27 (×16): 10 mg via ORAL
  Filled 2019-11-19 (×19): qty 1

## 2019-11-19 MED ORDER — PANTOPRAZOLE SODIUM 40 MG PO TBEC
40.0000 mg | DELAYED_RELEASE_TABLET | Freq: Every day | ORAL | Status: DC
  Administered 2019-11-19 – 2019-11-21 (×3): 40 mg via ORAL
  Filled 2019-11-19 (×3): qty 1

## 2019-11-19 MED ORDER — HYDRALAZINE HCL 20 MG/ML IJ SOLN: 10.0000 mg | INTRAMUSCULAR | Status: DC | PRN

## 2019-11-19 MED ORDER — ONDANSETRON HCL 4 MG PO TABS: 4.0000 mg | ORAL_TABLET | Freq: Four times a day (QID) | ORAL | Status: DC | PRN

## 2019-11-19 MED ORDER — OMEGA-3-ACID ETHYL ESTERS 1 G PO CAPS
2.0000 g | ORAL_CAPSULE | Freq: Two times a day (BID) | ORAL | Status: DC
  Administered 2019-11-19 – 2019-11-24 (×9): 2 g via ORAL
  Filled 2019-11-19 (×11): qty 2

## 2019-11-19 MED ORDER — VANCOMYCIN HCL 500 MG/100ML IV SOLN
500.0000 mg | Freq: Two times a day (BID) | INTRAVENOUS | Status: DC
  Administered 2019-11-20: 500 mg via INTRAVENOUS
  Filled 2019-11-19 (×2): qty 100

## 2019-11-19 MED ORDER — SODIUM CHLORIDE 0.9 % IV SOLN: INTRAVENOUS | Status: AC

## 2019-11-19 MED ORDER — SODIUM CHLORIDE 0.9 % IV BOLUS
1000.0000 mL | Freq: Once | INTRAVENOUS | Status: AC
  Administered 2019-11-19: 1000 mL via INTRAVENOUS

## 2019-11-19 MED ORDER — SODIUM CHLORIDE 0.9% FLUSH
3.0000 mL | Freq: Once | INTRAVENOUS | Status: AC
  Administered 2019-11-19: 3 mL via INTRAVENOUS

## 2019-11-19 MED ORDER — ONDANSETRON 4 MG PO TBDP
4.0000 mg | ORAL_TABLET | Freq: Three times a day (TID) | ORAL | 0 refills | Status: DC | PRN
Start: 2019-11-19 — End: 2019-12-31

## 2019-11-19 MED ORDER — ALBUTEROL SULFATE (2.5 MG/3ML) 0.083% IN NEBU: 3.0000 mL | INHALATION_SOLUTION | RESPIRATORY_TRACT | Status: DC | PRN

## 2019-11-19 MED ORDER — MOMETASONE FURO-FORMOTEROL FUM 200-5 MCG/ACT IN AERO
2.0000 | INHALATION_SPRAY | Freq: Two times a day (BID) | RESPIRATORY_TRACT | Status: DC
  Administered 2019-11-20 – 2019-11-27 (×13): 2 via RESPIRATORY_TRACT
  Filled 2019-11-19: qty 8.8

## 2019-11-19 MED ORDER — ACETAMINOPHEN 325 MG PO TABS
650.0000 mg | ORAL_TABLET | Freq: Once | ORAL | Status: AC
  Administered 2019-11-19: 650 mg via ORAL
  Filled 2019-11-19: qty 2

## 2019-11-19 MED ORDER — CEPHALEXIN 500 MG PO CAPS: 500.0000 mg | ORAL_CAPSULE | Freq: Two times a day (BID) | ORAL | 0 refills | Status: DC

## 2019-11-19 MED ORDER — VANCOMYCIN HCL 1250 MG/250ML IV SOLN
1250.0000 mg | Freq: Once | INTRAVENOUS | Status: AC
  Administered 2019-11-19: 1250 mg via INTRAVENOUS
  Filled 2019-11-19: qty 250

## 2019-11-19 MED ORDER — ENOXAPARIN SODIUM 40 MG/0.4ML ~~LOC~~ SOLN
40.0000 mg | Freq: Every day | SUBCUTANEOUS | Status: DC
  Administered 2019-11-19 – 2019-11-21 (×3): 40 mg via SUBCUTANEOUS
  Filled 2019-11-19 (×3): qty 0.4

## 2019-11-19 NOTE — Progress Notes (Signed)
Pharmacy Antibiotic Note  [name] is a 79 y.o. female admitted on 11/19/2019 with fever of unknown source.  Pharmacy has been consulted for vancomycin dosing. She has received a dose of vancomycin in the ED. WBC mildly elevated. SCr wnl.   Plan: -Vancomycin 1250 mg IV once then vancomycin 500 mg IV Q 12 hours -Monitor CBC, renal fx, cultures and clinical progress -VT as indicated   Height: 5' 3"  (160 cm) Weight: 68 kg (150 lb) IBW/kg (Calculated) : 52.4  Temp (24hrs), Avg:100.4 F (38 C), Min:98.7 F (37.1 C), Max:102.1 F (38.9 C)  Recent Labs  Lab 11/19/19 1121  WBC 11.9*  CREATININE 0.79  LATICACIDVEN 1.2    Estimated Creatinine Clearance: 51 mL/min (by C-G formula based on SCr of 0.79 mg/dL).    Allergies  Allergen Reactions  . Ace Inhibitors     cough  . Amoxicillin Itching    REACTION: unknown? pain in right kidney  . Benicar Hct [Olmesartan Medoxomil-Hctz]     Extreme weakness  . Budesonide-Formoterol Fumarate     Causes tremors and numbness  . Chlorzoxazone [Chlorzoxazone]   . Citalopram Hydrobromide     REACTION: hives  . Citalopram Hydrobromide   . Clonidine Derivatives   . Cymbalta [Duloxetine Hcl]   . Droperidol     REACTION: hives  . Flexeril [Cyclobenzaprine Hcl]   . Fluconazole Nausea And Vomiting and Other (See Comments)    fatigue  . Gabapentin Itching  . Ketorolac Tromethamine     REACTION: hives  . Ketorolac Tromethamine   . Metoclopramide Hcl   . Mometasone Furo-Formoterol Fum     Causes sore throat, blurred vision and unable to sleep--started on med 09-17-10  . Morphine     REACTION: hives and itching  . Olmesartan Medoxomil     REACTION: fatique  . Breo Ellipta [Fluticasone Furoate-Vilanterol] Itching    Pt reports itching with Memory [place] is related to being lactose intolerant.      Thank you for allowing pharmacy to be a part of this patient's care.  Albertina Parr, PharmD., BCPS, BCCCP Clinical Pharmacist Clinical phone  for 11/19/19 until 11:30pm: [phone] If after 11:30pm, please refer to [facility] for unit-specific pharmacist

## 2019-11-19 NOTE — ED Provider Notes (Addendum)
[facility] EMERGENCY DEPARTMENT Provider Note   CSN: [id number] Arrival date & time: 11/19/19  1059     History No chief complaint on file.   [name] is a 79 y.o. female.  HPI      [name] is a 79 y.o. female, with a history of asthma, anxiety, HTN, nonalcoholic cirrhosis, presenting to the ED generally feeling poorly for about the last 5 days.  Complains of generalized weakness, chills, headache, ear pain bilaterally, nausea, and diarrhea. She notes this morning she had a fever of 102 F. She states she has completed 2 injections of the Pfizer Covid vaccine. Denies cough, shortness of breath, chest pain, abdominal pain, syncope, dysuria, hematuria, hematochezia/melena, or any other complaints.   Past Medical History:  Diagnosis Date  . ALLERGIC RHINITIS   . Anxiety   . Asthma   . Blood type O+   . Cervical strain, acute   . Cirrhosis ([place])   . Colon polyp 2010   adenoma  . Diverticulosis of colon   . Dizziness   . Esophageal varices ([place])   . Fibromyalgia   . GERD (gastroesophageal reflux disease)   . History of nephrolithiasis   . Hypercholesterolemia    borderline  . Hypertension   . IBS (irritable bowel syndrome)   . Osteoarthritis   . Personal history of allergy to unspecified medicinal agent   . Postconcussion syndrome   . Vitamin D deficiency     Patient Active Problem List   Diagnosis Date Noted  . Fever 11/19/2019  . Acute lower UTI 11/19/2019  . Diarrhea 11/19/2019  . Generalized weakness   . Esophageal varices without bleeding ([place]) 07/07/2019  . Bilateral hip pain 04/29/2019  . Pancreatitis 07/05/2017  . Chronic pansinusitis 12/02/2016  . Asthma, allergic, mild intermittent, uncomplicated 35/59/7416  . Environmental and seasonal allergies 12/02/2016  . Obesity (BMI 30.0-34.9) 12/02/2016  . Allergic rhinitis 10/15/2016  . Presbyopia of both eyes 06/27/2016  . Pseudophakia 06/27/2016  . Urine frequency  06/26/2016  . Obese 06/26/2016  . Hyperglycemia 11/13/2015  . Numbness and tingling of both legs below knees 05/24/2014  . Dupuytren's contracture of both hands 01/18/2014  . Dyslipidemia 10/08/2011  . Asthmatic bronchitis 04/25/2011  . Acute sinusitis, unspecified 12/20/2010  . Back pain 07/12/2010  . NEPHROLITHIASIS 04/26/2009  . Vitamin D deficiency 10/30/2008  . COLONIC POLYPS 10/22/2007  . Diverticulosis of colon 10/22/2007  . Seasonal and perennial allergic rhinitis 06/13/2007  . GERD 06/13/2007  . IBS 06/13/2007  . FIBROMYALGIA 06/13/2007  . PERSONAL HISTORY ALLERGY UNSPEC MEDICINAL AGENT 06/13/2007  . Anxiety state 05/04/2007  . Essential hypertension 05/04/2007  . Osteoarthritis 05/04/2007  . DIZZINESS 04/28/2007    Past Surgical History:  Procedure Laterality Date  . CHOLECYSTECTOMY, LAPAROSCOPIC  2004   for gallstone pancreatitis  . KNEE SURGERY Right   . KNEE SURGERY Left   . THYROID SURGERY       OB History   No obstetric history on file.     Family History  Problem Relation Age of Onset  . Breast cancer Mother   . Alzheimer's disease Mother   . Heart disease Mother   . COPD Brother   . Heart disease Sister   . Breast cancer Sister 96  . Alcohol abuse Sister   . Ovarian cancer Paternal Grandmother   . Arthritis Other        Cousins   . COPD Cousin  Maternal side   . Heart attack Maternal Uncle     Social History   Tobacco Use  . Smoking status: Former Smoker    Packs/day: 2.00    Years: 8.00    Pack years: 16.00    Types: Cigarettes    Quit date: 05/13/1962    Years since quitting: 57.5  . Smokeless tobacco: Never Used  Vaping Use  . Vaping Use: Never used  Substance Use Topics  . Alcohol use: No  . Drug use: No    Home Medications Prior to Admission medications   Medication Sig Start Date End Date Taking? Authorizing Provider  acetaminophen (TYLENOL) 500 MG tablet Take 1,000 mg by mouth 2 (two) times a day.   Yes [provider]  ADVAIR HFA 115-21 MCG/ACT inhaler TAKE 2 PUFFS BY MOUTH TWICE A DAY Patient taking differently: Inhale 2 puffs into the lungs 2 (two) times daily.  07/14/19  Yes Lauree Chandler, NP  amLODipine (NORVASC) 2.5 MG tablet TAKE 1 TABLET BY MOUTH EVERY DAY Patient taking differently: Take 2.5 mg by mouth daily.  11/16/19  Yes Lauree Chandler, NP  benzonatate (TESSALON) 100 MG capsule Take 1 capsule (100 mg total) by mouth 2 (two) times daily as needed for cough. 04/06/19  Yes Ngetich, Dinah C, NP  calcium carbonate (TUMS - DOSED IN MG ELEMENTAL CALCIUM) 500 MG chewable tablet Chew 1 tablet by mouth as needed for indigestion or heartburn.   Yes [provider]  Calcium Carbonate-Vitamin D (CALCIUM 600+D) 600-400 MG-UNIT tablet Take 1 tablet by mouth 2 (two) times daily.   Yes [provider]  esomeprazole (NEXIUM) 40 MG capsule Take 1 tablet by mouth once or twice daily as needed Patient taking differently: Take 40 mg by mouth 2 (two) times daily as needed (acid reflux).  06/23/19  Yes Armbruster, Carlota Raspberry, MD  hydrochlorothiazide (HYDRODIURIL) 25 MG tablet Take 1 tablet (25 mg total) by mouth daily. 01/04/19  Yes Eubanks, Carlos American, NP  KLOR-CON M20 20 MEQ tablet TAKE 1 TABLET BY MOUTH EVERY DAY Patient taking differently: Take 20 mEq by mouth daily.  08/02/19  Yes Lauree Chandler, NP  losartan (COZAAR) 25 MG tablet TAKE 1 TABLET BY MOUTH EVERY DAY Patient taking differently: Take 25 mg by mouth daily.  09/17/19  Yes Lauree Chandler, NP  meclizine (ANTIVERT) 25 MG tablet TAKE 1 TABLET BY MOUTH 3 TIMES A DAY AS NEEDED FOR DIZZINESS OR NAUSEA Patient taking differently: Take 25 mg by mouth 3 (three) times daily as needed for dizziness or nausea.  09/03/19  Yes Lauree Chandler, NP  Multiple Vitamins-Minerals (CENTRUM SILVER PO) Take 1 tablet by mouth daily.    Yes [provider]  Omega-3 Fatty Acids (FISH OIL MAXIMUM STRENGTH) 1200 MG CAPS Take 1,200 mg by  mouth 2 (two) times daily.    Yes [provider]  PROAIR HFA 108 (90 Base) MCG/ACT inhaler INHALE 2 PUFFS EVERY 4 HOURS AS NEEDED INTO THE LUNGS FOR WHEEZING OR SHORTNESS OF BREATH Patient taking differently: Inhale 2 puffs into the lungs every 4 (four) hours as needed for wheezing or shortness of breath.  07/31/18  Yes Lauree Chandler, NP  propranolol (INDERAL) 10 MG tablet Take 1 tablet (10 mg total) by mouth 2 (two) times daily. You are due for an office visit with Dr. Havery Moros. Please call to schedule. Thank you 10/11/19  Yes Armbruster, Carlota Raspberry, MD  cephALEXin (KEFLEX) 500 MG capsule Take 1  capsule (500 mg total) by mouth 2 (two) times daily for 5 days. 11/19/19 11/24/19  Schuyler Behan C, PA-C  ondansetron (ZOFRAN ODT) 4 MG disintegrating tablet Take 1 tablet (4 mg total) by mouth every 8 (eight) hours as needed for nausea or vomiting. 11/19/19   Zakeria Kulzer C, PA-C  Propylene Glycol (SYSTANE BALANCE) 0.6 % SOLN Place 1 drop into both ears 2 (two) times daily. Patient not taking: Reported on 11/19/2019 11/27/17   Hollace Kinnier L, DO    Allergies    Ace inhibitors, Amoxicillin, Benicar hct [olmesartan medoxomil-hctz], Budesonide-formoterol fumarate, Chlorzoxazone [chlorzoxazone], Citalopram hydrobromide, Citalopram hydrobromide, Clonidine derivatives, Cymbalta [duloxetine hcl], Droperidol, Flexeril [cyclobenzaprine hcl], Fluconazole, Gabapentin, Ketorolac tromethamine, Ketorolac tromethamine, Metoclopramide hcl, Mometasone furo-formoterol fum, Morphine, Olmesartan medoxomil, and Breo ellipta [fluticasone furoate-vilanterol]  Review of Systems   Review of Systems  Constitutional: Positive for chills, fatigue and fever.  HENT: Positive for ear pain. Negative for congestion, sore throat and trouble swallowing.   Respiratory: Negative for cough and shortness of breath.   Cardiovascular: Negative for chest pain and leg swelling.  Gastrointestinal: Positive for diarrhea and nausea. Negative for  abdominal pain, blood in stool and vomiting.  Genitourinary: Negative for dysuria, flank pain and frequency.  Neurological: Positive for weakness and headaches. Negative for dizziness and syncope.  All other systems reviewed and are negative.   Physical Exam Updated Vital Signs BP 126/64 (BP Location: Left Arm)   Pulse 83   Temp 98.7 F (37.1 C) (Oral)   Resp 19   Ht 5' 3"  (1.6 m)   Wt 68 kg   SpO2 97%   BMI 26.57 kg/m   Physical Exam Vitals and nursing note reviewed.  Constitutional:      General: She is not in acute distress.    Appearance: She is well-developed. She is not diaphoretic.  HENT:     Head: Normocephalic and atraumatic.     Right Ear: Tympanic membrane, ear canal and external ear normal.     Left Ear: Tympanic membrane, ear canal and external ear normal.     Nose: Nose normal.     Mouth/Throat:     Mouth: Mucous membranes are moist.     Pharynx: Oropharynx is clear. Uvula midline.  Eyes:     Conjunctiva/sclera: Conjunctivae normal.  Cardiovascular:     Rate and Rhythm: Normal rate and regular rhythm.     Pulses: Normal pulses.          Radial pulses are 2+ on the right side and 2+ on the left side.       Posterior tibial pulses are 2+ on the right side and 2+ on the left side.     Heart sounds: Murmur heard.      Comments: Tactile temperature in the extremities appropriate and equal bilaterally. Pulmonary:     Effort: Pulmonary effort is normal. No respiratory distress.     Breath sounds: Normal breath sounds.  Abdominal:     Palpations: Abdomen is soft.     Tenderness: There is no abdominal tenderness. There is no guarding.  Musculoskeletal:     Cervical back: Neck supple.     Right lower leg: No edema.     Left lower leg: No edema.  Lymphadenopathy:     Cervical: No cervical adenopathy.  Skin:    General: Skin is warm and dry.  Neurological:     Mental Status: She is alert.  Psychiatric:        Mood and Affect: Mood  and affect normal.         Speech: Speech normal.        Behavior: Behavior normal.     ED Results / Procedures / Treatments   Labs (all labs ordered are listed, but only abnormal results are displayed) Labs Reviewed  COMPREHENSIVE METABOLIC PANEL - Abnormal; Notable for the following components:      Result Value   Sodium 129 (*)    Chloride 94 (*)    Glucose, Bld 151 (*)    Calcium 8.7 (*)    Total Protein 6.3 (*)    All other components within normal limits  CBC - Abnormal; Notable for the following components:   WBC 11.9 (*)    All other components within normal limits  URINALYSIS, ROUTINE W REFLEX MICROSCOPIC - Abnormal; Notable for the following components:   Ketones, ur 5 (*)    Leukocytes,Ua TRACE (*)    Bacteria, UA RARE (*)    All other components within normal limits  SARS CORONAVIRUS 2 BY RT PCR (HOSPITAL ORDER, [facility])  URINE CULTURE  CULTURE, BLOOD (ROUTINE X 2)  CULTURE, BLOOD (ROUTINE X 2)  GASTROINTESTINAL PANEL BY PCR, STOOL (REPLACES STOOL CULTURE)  LIPASE, BLOOD  LACTIC ACID, PLASMA  LACTIC ACID, PLASMA  BASIC METABOLIC PANEL  CBC  CBC  CREATININE, SERUM    EKG EKG Interpretation  Date/Time:  Friday November 19 2019 11:22:45 EDT Ventricular Rate:  72 PR Interval:  168 QRS Duration: 84 QT Interval:  344 QTC Calculation: 376 R Axis:   50 Text Interpretation: Normal sinus rhythm Nonspecific ST abnormality Abnormal ECG Confirmed by Milton Ferguson [id number]) on 11/19/2019 2:54:00 PM   Radiology DG Chest 2 View  Result Date: 11/19/2019 CLINICAL DATA:  Weakness. EXAM: CHEST - 2 VIEW COMPARISON:  March 28, 2018. FINDINGS: The heart size and mediastinal contours are within normal limits. Both lungs are clear. No pneumothorax pleural effusion is noted. Atherosclerosis of thoracic aorta is noted. The visualized skeletal structures are unremarkable. IMPRESSION: No active cardiopulmonary disease. Aortic Atherosclerosis (ICD10-I70.0). Electronically Signed    By: Marijo Conception M.D.   On: 11/19/2019 12:04    Procedures Procedures (including critical care time)  Medications Ordered in ED Medications  vancomycin (VANCOREADY) IVPB 500 mg/100 mL (has no administration in time range)  amLODipine (NORVASC) tablet 2.5 mg (0 mg Oral Hold 11/19/19 2321)  losartan (COZAAR) tablet 25 mg (has no administration in time range)  propranolol (INDERAL) tablet 10 mg (10 mg Oral Given 11/19/19 2318)  pantoprazole (PROTONIX) EC tablet 40 mg (40 mg Oral Given 11/19/19 2318)  omega-3 acid ethyl esters (LOVAZA) capsule 2 g (2 g Oral Given 11/19/19 2318)  mometasone-formoterol (DULERA) 200-5 MCG/ACT inhaler 2 puff (has no administration in time range)  albuterol (PROVENTIL) (2.5 MG/3ML) 0.083% nebulizer solution 3 mL (has no administration in time range)  ondansetron (ZOFRAN) tablet 4 mg (has no administration in time range)    Or  ondansetron (ZOFRAN) injection 4 mg (has no administration in time range)  enoxaparin (LOVENOX) injection 40 mg (40 mg Subcutaneous Given 11/19/19 2318)  0.9 %  sodium chloride infusion (has no administration in time range)  hydrALAZINE (APRESOLINE) injection 10 mg (has no administration in time range)  sodium chloride flush (NS) 0.9 % injection 3 mL (3 mLs Intravenous Given 11/19/19 1150)  ondansetron (ZOFRAN) injection 4 mg (4 mg Intravenous Given 11/19/19 1235)  sodium chloride 0.9 % bolus 1,000 mL (0 mLs Intravenous Stopped 11/19/19  2131)  cefTRIAXone (ROCEPHIN) 1 g in sodium chloride 0.9 % 100 mL IVPB (0 g Intravenous Stopped 11/19/19 1956)  acetaminophen (TYLENOL) tablet 650 mg (650 mg Oral Given 11/19/19 1910)  vancomycin (VANCOREADY) IVPB 1250 mg/250 mL (0 mg Intravenous Stopped 11/19/19 2310)    ED Course  I have reviewed the triage vital signs and the nursing notes.  Pertinent labs & imaging results that were available during my care of the patient were reviewed by me and considered in my medical decision making (see chart for  details).  Clinical Course as of Nov 18 2320  Fri Nov 19, 2019  2016 Spoke with Suezanne Jacquet, pharmacist, for advice on broadening the patient's antibiotics. Given the patient's age, risk factors, and the information gathered so far through labs and imaging studies, he would recommend adding vancomycin to the patient's regimen.   [SJ]  2018 Spoke with Dr. Hal Hope, hospitalist.  Agrees to admit the patient.   [SJ]    Clinical Course User Index [SJ] Leondra Cullin C, PA-C   MDM Rules/Calculators/A&P                          Patient presents feeling overall unwell with nausea and generalized weakness. She also endorses fever and chills. Patient is nontoxic appearing, afebrile upon arrival in the ED, not tachycardic, not tachypneic, not hypotensive, maintains excellent SPO2 on room air, and is in no apparent distress.   I have reviewed the patient's chart to obtain more information.   I reviewed and interpreted the patient's labs and radiological studies. Some decrease in patient's sodium and chloride as well as decrease in her total protein, consistent with her recent history of poor oral intake and some dehydration.   She states she feels improved with IV fluids. 11-20 WBCs and some bacteria noted on UA.  When we were preparing patient for discharge, she was noted to have a fever of 102 F.  With her feeling weak, living alone, and now febrile without a definite source, patient would benefit from admission for continued management.   Findings and plan of care discussed with Milton Ferguson, MD and then with Dr. Jeanell Sparrow after EDP shift change.   Vitals:   11/19/19 1257 11/19/19 1905 11/19/19 2034 11/19/19 2318  BP: 130/87  (!) 147/56 (!) 137/50  Pulse: 74  80 69  Resp: 16  12   Temp:  (!) 102.1 F (38.9 C) 100.3 F (37.9 C)   TempSrc:  Oral Oral   SpO2: 97%  96%   Weight:      Height:        Final Clinical Impression(s) / ED Diagnoses Final diagnoses:  Nausea  Generalized weakness   Fever, unspecified fever cause    Rx / DC Orders ED Discharge Orders         Ordered    ondansetron (ZOFRAN ODT) 4 MG disintegrating tablet  Every 8 hours PRN     Discontinue  Reprint     11/19/19 1754    cephALEXin (KEFLEX) 500 MG capsule  2 times daily     Discontinue  Reprint     11/19/19 Reynolds, Sydney Azure C, PA-C 11/19/19 1842    Lorayne Bender, PA-C 11/19/19 2322    Milton Ferguson, MD 11/22/19 1005

## 2019-11-19 NOTE — H&P (Signed)
History and Physical    [name] EXB:[id number] DOB: 1941/03/02 DOA: 11/19/2019  PCP: Lauree Chandler, NP  Patient coming from: Home.  Chief Complaint: Weakness.  HPI: [name] is a 79 y.o. female with history of cirrhosis of the liver secondary to [facility], hypertension, asthma presents to the ER because of ongoing weakness for the last 1 week where patient was feeling intensely fatigued.  Patient over the last 1 week he has been having increased frequency of urination and diarrhea.  Diarrhea is watery.  Denies any chest pain or shortness of breath.  Denies any loss of consciousness.  Given the worsening symptoms patient presents to the ER.  ED Course: In the ER patient is found to be febrile with a temperature 102 F and lab work show sodium 129 WBC 11.9 lactic acid was normal EKG shows normal sinus rhythm.  Chest x-ray was unremarkable UA is going which is concerning for UTI.  Given the symptoms patient had blood cultures drawn started on fluids empiric antibiotics admitted for further management.  Review of Systems: As per HPI, rest all negative.   Past Medical History:  Diagnosis Date  . ALLERGIC RHINITIS   . Anxiety   . Asthma   . Blood type O+   . Cervical strain, acute   . Cirrhosis ([place])   . Colon polyp 2010   adenoma  . Diverticulosis of colon   . Dizziness   . Esophageal varices ([place])   . Fibromyalgia   . GERD (gastroesophageal reflux disease)   . History of nephrolithiasis   . Hypercholesterolemia    borderline  . Hypertension   . IBS (irritable bowel syndrome)   . Osteoarthritis   . Personal history of allergy to unspecified medicinal agent   . Postconcussion syndrome   . Vitamin D deficiency     Past Surgical History:  Procedure Laterality Date  . CHOLECYSTECTOMY, LAPAROSCOPIC  2004   for gallstone pancreatitis  . KNEE SURGERY Right   . KNEE SURGERY Left   . THYROID SURGERY       reports that she quit smoking about 57 years ago. Her  smoking use included cigarettes. She has a 16.00 pack-year smoking history. She has never used smokeless tobacco. She reports that she does not drink alcohol and does not use drugs.  Allergies  Allergen Reactions  . Ace Inhibitors     cough  . Amoxicillin Itching    REACTION: unknown? pain in right kidney  . Benicar Hct [Olmesartan Medoxomil-Hctz]     Extreme weakness  . Budesonide-Formoterol Fumarate     Causes tremors and numbness  . Chlorzoxazone [Chlorzoxazone]   . Citalopram Hydrobromide     REACTION: hives  . Citalopram Hydrobromide   . Clonidine Derivatives   . Cymbalta [Duloxetine Hcl]   . Droperidol     REACTION: hives  . Flexeril [Cyclobenzaprine Hcl]   . Fluconazole Nausea And Vomiting and Other (See Comments)    fatigue  . Gabapentin Itching  . Ketorolac Tromethamine     REACTION: hives  . Ketorolac Tromethamine   . Metoclopramide Hcl   . Mometasone Furo-Formoterol Fum     Causes sore throat, blurred vision and unable to sleep--started on med 09-17-10  . Morphine     REACTION: hives and itching  . Olmesartan Medoxomil     REACTION: fatique  . Breo Ellipta [Fluticasone Furoate-Vilanterol] Itching    Pt reports itching with Memory [facility] is related to being lactose intolerant.  Family History  Problem Relation Age of Onset  . Breast cancer Mother   . Alzheimer's disease Mother   . Heart disease Mother   . COPD Brother   . Heart disease Sister   . Breast cancer Sister 35  . Alcohol abuse Sister   . Ovarian cancer Paternal Grandmother   . Arthritis Other        Cousins   . COPD Cousin        Maternal side   . Heart attack Maternal Uncle     Prior to Admission medications   Medication Sig Start Date End Date Taking? Authorizing Provider  acetaminophen (TYLENOL) 500 MG tablet Take 1,000 mg by mouth 2 (two) times a day.   Yes [provider]  ADVAIR HFA 115-21 MCG/ACT inhaler TAKE 2 PUFFS BY MOUTH TWICE A DAY Patient taking differently: Inhale 2 puffs  into the lungs 2 (two) times daily.  07/14/19  Yes Lauree Chandler, NP  amLODipine (NORVASC) 2.5 MG tablet TAKE 1 TABLET BY MOUTH EVERY DAY Patient taking differently: Take 2.5 mg by mouth daily.  11/16/19  Yes Lauree Chandler, NP  benzonatate (TESSALON) 100 MG capsule Take 1 capsule (100 mg total) by mouth 2 (two) times daily as needed for cough. 04/06/19  Yes Ngetich, Dinah C, NP  calcium carbonate (TUMS - DOSED IN MG ELEMENTAL CALCIUM) 500 MG chewable tablet Chew 1 tablet by mouth as needed for indigestion or heartburn.   Yes [provider]  Calcium Carbonate-Vitamin D (CALCIUM 600+D) 600-400 MG-UNIT tablet Take 1 tablet by mouth 2 (two) times daily.   Yes [provider]  esomeprazole (NEXIUM) 40 MG capsule Take 1 tablet by mouth once or twice daily as needed Patient taking differently: Take 40 mg by mouth 2 (two) times daily as needed (acid reflux).  06/23/19  Yes Armbruster, Carlota Raspberry, MD  hydrochlorothiazide (HYDRODIURIL) 25 MG tablet Take 1 tablet (25 mg total) by mouth daily. 01/04/19  Yes Eubanks, Carlos American, NP  KLOR-CON M20 20 MEQ tablet TAKE 1 TABLET BY MOUTH EVERY DAY Patient taking differently: Take 20 mEq by mouth daily.  08/02/19  Yes Lauree Chandler, NP  losartan (COZAAR) 25 MG tablet TAKE 1 TABLET BY MOUTH EVERY DAY Patient taking differently: Take 25 mg by mouth daily.  09/17/19  Yes Lauree Chandler, NP  meclizine (ANTIVERT) 25 MG tablet TAKE 1 TABLET BY MOUTH 3 TIMES A DAY AS NEEDED FOR DIZZINESS OR NAUSEA Patient taking differently: Take 25 mg by mouth 3 (three) times daily as needed for dizziness or nausea.  09/03/19  Yes Lauree Chandler, NP  Multiple Vitamins-Minerals (CENTRUM SILVER PO) Take 1 tablet by mouth daily.    Yes [provider]  Omega-3 Fatty Acids (FISH OIL MAXIMUM STRENGTH) 1200 MG CAPS Take 1,200 mg by mouth 2 (two) times daily.    Yes [provider]  PROAIR HFA 108 (90 Base) MCG/ACT inhaler INHALE 2 PUFFS EVERY 4 HOURS  AS NEEDED INTO THE LUNGS FOR WHEEZING OR SHORTNESS OF BREATH Patient taking differently: Inhale 2 puffs into the lungs every 4 (four) hours as needed for wheezing or shortness of breath.  07/31/18  Yes Lauree Chandler, NP  propranolol (INDERAL) 10 MG tablet Take 1 tablet (10 mg total) by mouth 2 (two) times daily. You are due for an office visit with Dr. Havery Moros. Please call to schedule. Thank you 10/11/19  Yes Armbruster, Carlota Raspberry, MD  cephALEXin (KEFLEX) 500 MG capsule Take 1  capsule (500 mg total) by mouth 2 (two) times daily for 5 days. 11/19/19 11/24/19  Joy, Shawn C, PA-C  ondansetron (ZOFRAN ODT) 4 MG disintegrating tablet Take 1 tablet (4 mg total) by mouth every 8 (eight) hours as needed for nausea or vomiting. 11/19/19   Joy, Shawn C, PA-C  Propylene Glycol (SYSTANE BALANCE) 0.6 % SOLN Place 1 drop into both ears 2 (two) times daily. Patient not taking: Reported on 11/19/2019 11/27/17   Gayland Curry, DO    Physical Exam: Constitutional: Moderately built and nourished. Vitals:   11/19/19 1106 11/19/19 1257 11/19/19 1905 11/19/19 2034  BP: 126/64 130/87  (!) 147/56  Pulse: 83 74  80  Resp: 19 16  12   Temp: 98.7 F (37.1 C)  (!) 102.1 F (38.9 C) 100.3 F (37.9 C)  TempSrc: Oral  Oral Oral  SpO2: 97% 97%  96%  Weight: 68 kg     Height: 5' 3"  (1.6 m)      Eyes: Anicteric no pallor. ENMT: No discharge from the ears eyes nose or mouth. Neck: No mass felt.  No neck rigidity. Respiratory: No rhonchi or crepitations. Cardiovascular: S1-S2 heard. Abdomen: Soft nontender bowel sounds present. Musculoskeletal: No edema. Skin: No rash. Neurologic: Alert awake oriented to time place and person.  Moves all extremities. Psychiatric: Appears normal per normal affect.   Labs on Admission: I have personally reviewed following labs and imaging studies  CBC: Recent Labs  Lab 11/19/19 1121  WBC 11.9*  HGB 13.2  HCT 38.3  MCV 87.2  PLT 762   Basic Metabolic Panel: Recent Labs   Lab 11/19/19 1121  NA 129*  K 3.5  CL 94*  CO2 25  GLUCOSE 151*  BUN 11  CREATININE 0.79  CALCIUM 8.7*   GFR: Estimated Creatinine Clearance: 51 mL/min (by C-G formula based on SCr of 0.79 mg/dL). Liver Function Tests: Recent Labs  Lab 11/19/19 1121  AST 24  ALT 18  ALKPHOS 59  BILITOT 1.0  PROT 6.3*  ALBUMIN 3.7   Recent Labs  Lab 11/19/19 1121  LIPASE 35   No results for input(s): AMMONIA in the last 168 hours. Coagulation Profile: No results for input(s): INR, PROTIME in the last 168 hours. Cardiac Enzymes: No results for input(s): CKTOTAL, CKMB, CKMBINDEX, TROPONINI in the last 168 hours. BNP (last 3 results) No results for input(s): PROBNP in the last 8760 hours. HbA1C: No results for input(s): HGBA1C in the last 72 hours. CBG: No results for input(s): GLUCAP in the last 168 hours. Lipid Profile: No results for input(s): CHOL, HDL, LDLCALC, TRIG, CHOLHDL, LDLDIRECT in the last 72 hours. Thyroid Function Tests: No results for input(s): TSH, T4TOTAL, FREET4, T3FREE, THYROIDAB in the last 72 hours. Anemia Panel: No results for input(s): VITAMINB12, FOLATE, FERRITIN, TIBC, IRON, RETICCTPCT in the last 72 hours. Urine analysis:    Component Value Date/Time   COLORURINE YELLOW 11/19/2019 1408   APPEARANCEUR CLEAR 11/19/2019 1408   LABSPEC 1.009 11/19/2019 1408   PHURINE 5.0 11/19/2019 1408   GLUCOSEU NEGATIVE 11/19/2019 1408   GLUCOSEU NEGATIVE 12/31/2011 1512   HGBUR NEGATIVE 11/19/2019 1408   BILIRUBINUR NEGATIVE 11/19/2019 1408   BILIRUBINUR small 02/02/2019 1036   KETONESUR 5 (A) 11/19/2019 1408   PROTEINUR NEGATIVE 11/19/2019 1408   UROBILINOGEN 1.0 02/02/2019 1036   UROBILINOGEN 0.2 12/31/2011 1512   NITRITE NEGATIVE 11/19/2019 1408   LEUKOCYTESUR TRACE (A) 11/19/2019 1408   Sepsis Labs: @LABRCNTIP (procalcitonin:4,lacticidven:4) ) Recent Results (from the past 240 hour(s))  SARS Coronavirus 2 by RT PCR (hospital order, performed in [facility] hospital lab) Nasopharyngeal Nasopharyngeal Swab     Status: None   Collection Time: 11/19/19 12:20 PM   Specimen: Nasopharyngeal Swab  Result Value Ref Range Status   SARS Coronavirus 2 NEGATIVE NEGATIVE Final    Comment: (NOTE) SARS-CoV-2 target nucleic acids are NOT DETECTED.  The SARS-CoV-2 RNA is generally detectable in upper and lower respiratory specimens during the acute phase of infection. The lowest concentration of SARS-CoV-2 viral copies this assay can detect is 250 copies / mL. A negative result does not preclude SARS-CoV-2 infection and should not be used as the sole basis for treatment or other patient management decisions.  A negative result may occur with improper specimen collection / handling, submission of specimen other than nasopharyngeal swab, presence of viral mutation(s) within the areas targeted by this assay, and inadequate number of viral copies (<250 copies / mL). A negative result must be combined with clinical observations, patient history, and epidemiological information.  Fact Sheet for Patients:   StrictlyIdeas.no  Fact Sheet for Healthcare Providers: BankingDealers.co.za  This test is not yet approved or  cleared by the [place] FDA and has been authorized for detection and/or diagnosis of SARS-CoV-2 by FDA under an Emergency Use Authorization (EUA).  This EUA will remain in effect (meaning this test can be used) for the duration of the COVID-19 declaration under Section 564(b)(1) of the Act, 21 U.S.C. section 360bbb-3(b)(1), unless the authorization is terminated or revoked sooner.  Performed at [facility], [employer] [address], [place], [place] [address]      Radiological Exams on Admission: DG Chest 2 View  Result Date: 11/19/2019 CLINICAL DATA:  Weakness. EXAM: CHEST - 2 VIEW COMPARISON:  March 28, 2018. FINDINGS: The heart size and mediastinal contours are within normal  limits. Both lungs are clear. No pneumothorax pleural effusion is noted. Atherosclerosis of thoracic aorta is noted. The visualized skeletal structures are unremarkable. IMPRESSION: No active cardiopulmonary disease. Aortic Atherosclerosis (ICD10-I70.0). Electronically Signed   By: Marijo Conception M.D.   On: 11/19/2019 12:04    EKG: Independently reviewed.  Normal sinus rhythm.  Assessment/Plan Active Problems:   Essential hypertension   Fever   Acute lower UTI   Diarrhea    1. Fever with generalized fatigue and weakness could be from UTI patient also has diarrhea which could be contributing.  Will check GI pathogen panel follow cultures continue with empiric antibiotics for now and gently hydrate.  Get physical therapy consult. 2. Hyponatremia could be from dehydration.  Will hold off hydrochlorothiazide gently hydrate.  Follow metabolic panel. 3. History of cirrhosis of liver with varices on propranolol.  Abdomen doesn't appear distended no any signs of ascites.  Will check sonogram to see if we need to have any paracentesis given the fever. 4. Hypertension on amlodipine and ARB.  Holding of hydrochlorothiazide due to hyponatremia.  As needed IV hydralazine. 5. Asthma presently not wheezing.   DVT prophylaxis: Lovenox. Code Status: Full code. Family Communication: Discussed with patient. Disposition Plan: Home. Consults called: None. Admission status: Observation.   Rise Patience MD [facility] Pager [phone].  If 7PM-7AM, please contact night-coverage [email or web address] Password [facility]  11/19/2019, 9:54 PM

## 2019-11-19 NOTE — Discharge Instructions (Addendum)
Nausea, Vomiting, and Diarrhea   Hand washing: Wash your hands throughout the day, but especially before and after touching the face, using the restroom, sneezing, coughing, or touching surfaces that have been coughed or sneezed upon.  Hydration: Symptoms will be intensified and complicated by dehydration. Dehydration can also extend the duration of symptoms. Drink plenty of fluids and get plenty of rest. You should be drinking at least half a liter of water an hour to stay hydrated. Electrolyte drinks (ex. Gatorade, Powerade, Pedialyte) are also encouraged. You should be drinking enough fluids to make your urine light yellow, almost clear. If this is not the case, you are not drinking enough water.  Please note that some of the treatments indicated below will not be effective if you are not adequately hydrated.  Diet: Please concentrate on hydration, however, you may introduce food slowly.  Start with a clear liquid diet, progressed to a full liquid diet, and then bland solids as you are able.  Pain or fever: Tylenol for pain or fever.   Nausea/vomiting: Use the ondansetron (generic for Zofran) for nausea or vomiting.   This medication may not prevent all vomiting or nausea, but can help facilitate better hydration.  Things that can help with nausea/vomiting also include peppermint/menthol candies, vitamin B12, and ginger.  Diarrhea: May use medications such as loperamide (Imodium) or Bismuth subsalicylate (Pepto-Bismol).  Follow-up: Follow-up with a primary care provider on this matter.  Return: Return should you develop a fever, bloody diarrhea, abdominal pain, uncontrolled vomiting, or any other major concerns.  For prescription assistance, may try using prescription discount sites or apps, such as goodrx.com  Information on my medicine - ELIQUIS (apixaban)  Why was Eliquis prescribed for you? Eliquis was prescribed for you to reduce the risk of a blood clot forming that can  cause a stroke if you have a medical condition called atrial fibrillation (a type of irregular heartbeat).  What do You need to know about Eliquis ? Take your Eliquis TWICE DAILY - one tablet in the morning and one tablet in the evening with or without food. If you have difficulty swallowing the tablet whole please discuss with your pharmacist how to take the medication safely.  Take Eliquis exactly as prescribed by your doctor and DO NOT stop taking Eliquis without talking to the doctor who prescribed the medication.  Stopping may increase your risk of developing a stroke.  Refill your prescription before you run out.  After discharge, you should have regular check-up appointments with your healthcare provider that is prescribing your Eliquis.  In the future your dose may need to be changed if your kidney function or weight changes by a significant amount or as you get older.  What do you do if you miss a dose? If you miss a dose, take it as soon as you remember on the same day and resume taking twice daily.  Do not take more than one dose of ELIQUIS at the same time to make up a missed dose.  Important Safety Information A possible side effect of Eliquis is bleeding. You should call your healthcare provider right away if you experience any of the following: ? Bleeding from an injury or your nose that does not stop. ? Unusual colored urine (red or dark brown) or unusual colored stools (red or black). ? Unusual bruising for unknown reasons. ? A serious fall or if you hit your head (even if there is no bleeding).  Some medicines may interact with Eliquis  and might increase your risk of bleeding or clotting while on Eliquis. To help avoid this, consult your healthcare provider or pharmacist prior to using any new prescription or non-prescription medications, including herbals, vitamins, non-steroidal anti-inflammatory drugs (NSAIDs) and supplements.  This website has more information on  Eliquis (apixaban): http://www.eliquis.com/eliquis/home

## 2019-11-19 NOTE — ED Notes (Signed)
Pt aware of need for urine sample, call light at bedside

## 2019-11-19 NOTE — ED Triage Notes (Signed)
Pt here with c/o weakness and n/v fever and some urinary frequency over the last week, pt does have non alcoholic cirrhosis

## 2019-11-19 NOTE — ED Notes (Addendum)
Pt bowel incontinent with diarrhea. Pt and bed have been cleaned and changed. Pt stated that the diarrhea came suddenly, before she had time to call for assistance to bathroom or BSC.

## 2019-11-20 ENCOUNTER — Observation Stay (HOSPITAL_COMMUNITY): Payer: PPO

## 2019-11-20 DIAGNOSIS — Z66 Do not resuscitate: Secondary | ICD-10-CM | POA: Diagnosis present

## 2019-11-20 DIAGNOSIS — E7800 Pure hypercholesterolemia, unspecified: Secondary | ICD-10-CM | POA: Diagnosis present

## 2019-11-20 DIAGNOSIS — N390 Urinary tract infection, site not specified: Secondary | ICD-10-CM | POA: Diagnosis present

## 2019-11-20 DIAGNOSIS — I8500 Esophageal varices without bleeding: Secondary | ICD-10-CM | POA: Diagnosis not present

## 2019-11-20 DIAGNOSIS — D72829 Elevated white blood cell count, unspecified: Secondary | ICD-10-CM | POA: Diagnosis not present

## 2019-11-20 DIAGNOSIS — A09 Infectious gastroenteritis and colitis, unspecified: Secondary | ICD-10-CM | POA: Diagnosis not present

## 2019-11-20 DIAGNOSIS — E860 Dehydration: Secondary | ICD-10-CM | POA: Diagnosis present

## 2019-11-20 DIAGNOSIS — E876 Hypokalemia: Secondary | ICD-10-CM | POA: Diagnosis not present

## 2019-11-20 DIAGNOSIS — Z86010 Personal history of colonic polyps: Secondary | ICD-10-CM | POA: Diagnosis not present

## 2019-11-20 DIAGNOSIS — Z20822 Contact with and (suspected) exposure to covid-19: Secondary | ICD-10-CM | POA: Diagnosis present

## 2019-11-20 DIAGNOSIS — R188 Other ascites: Secondary | ICD-10-CM | POA: Diagnosis not present

## 2019-11-20 DIAGNOSIS — Z9049 Acquired absence of other specified parts of digestive tract: Secondary | ICD-10-CM | POA: Diagnosis not present

## 2019-11-20 DIAGNOSIS — E861 Hypovolemia: Secondary | ICD-10-CM | POA: Diagnosis present

## 2019-11-20 DIAGNOSIS — J309 Allergic rhinitis, unspecified: Secondary | ICD-10-CM | POA: Diagnosis present

## 2019-11-20 DIAGNOSIS — K7469 Other cirrhosis of liver: Secondary | ICD-10-CM

## 2019-11-20 DIAGNOSIS — I8510 Secondary esophageal varices without bleeding: Secondary | ICD-10-CM | POA: Diagnosis present

## 2019-11-20 DIAGNOSIS — F419 Anxiety disorder, unspecified: Secondary | ICD-10-CM | POA: Diagnosis present

## 2019-11-20 DIAGNOSIS — E871 Hypo-osmolality and hyponatremia: Secondary | ICD-10-CM | POA: Diagnosis present

## 2019-11-20 DIAGNOSIS — I361 Nonrheumatic tricuspid (valve) insufficiency: Secondary | ICD-10-CM | POA: Diagnosis not present

## 2019-11-20 DIAGNOSIS — K7581 Nonalcoholic steatohepatitis (NASH): Secondary | ICD-10-CM | POA: Diagnosis present

## 2019-11-20 DIAGNOSIS — K589 Irritable bowel syndrome without diarrhea: Secondary | ICD-10-CM | POA: Diagnosis present

## 2019-11-20 DIAGNOSIS — A0472 Enterocolitis due to Clostridium difficile, not specified as recurrent: Secondary | ICD-10-CM | POA: Diagnosis present

## 2019-11-20 DIAGNOSIS — E785 Hyperlipidemia, unspecified: Secondary | ICD-10-CM | POA: Diagnosis present

## 2019-11-20 DIAGNOSIS — K219 Gastro-esophageal reflux disease without esophagitis: Secondary | ICD-10-CM | POA: Diagnosis present

## 2019-11-20 DIAGNOSIS — R110 Nausea: Secondary | ICD-10-CM | POA: Diagnosis present

## 2019-11-20 DIAGNOSIS — A045 Campylobacter enteritis: Secondary | ICD-10-CM | POA: Diagnosis not present

## 2019-11-20 DIAGNOSIS — I10 Essential (primary) hypertension: Secondary | ICD-10-CM | POA: Diagnosis present

## 2019-11-20 DIAGNOSIS — E559 Vitamin D deficiency, unspecified: Secondary | ICD-10-CM | POA: Diagnosis present

## 2019-11-20 DIAGNOSIS — M797 Fibromyalgia: Secondary | ICD-10-CM | POA: Diagnosis present

## 2019-11-20 DIAGNOSIS — I4891 Unspecified atrial fibrillation: Secondary | ICD-10-CM | POA: Diagnosis not present

## 2019-11-20 DIAGNOSIS — Z87442 Personal history of urinary calculi: Secondary | ICD-10-CM | POA: Diagnosis not present

## 2019-11-20 LAB — GASTROINTESTINAL PANEL BY PCR, STOOL (REPLACES STOOL CULTURE)

## 2019-11-20 LAB — BASIC METABOLIC PANEL
Anion gap: 9 (ref 5–15)
BUN: 11 mg/dL (ref 8–23)
CO2: 24 mmol/L (ref 22–32)
Calcium: 8.4 mg/dL — ABNORMAL LOW (ref 8.9–10.3)
Chloride: 100 mmol/L (ref 98–111)
Creatinine, Ser: 0.64 mg/dL (ref 0.44–1.00)
GFR calc Af Amer: >60 mL/min (ref >60)
GFR calc non Af Amer: >60 mL/min (ref >60)
Glucose, Bld: 107 mg/dL — ABNORMAL HIGH (ref 70–99)
Potassium: 3.6 mmol/L (ref 3.5–5.1)
Sodium: 133 mmol/L — ABNORMAL LOW (ref 135–145)

## 2019-11-20 LAB — CBC
HCT: 33.6 % — ABNORMAL LOW (ref 36.0–46.0)
HCT: 38.5 % (ref 36.0–46.0)
Hemoglobin: 11.4 g/dL — ABNORMAL LOW (ref 12.0–15.0)
Hemoglobin: 13 g/dL (ref 12.0–15.0)
MCH: 29.9 pg (ref 26.0–34.0)
MCH: 30.1 pg (ref 26.0–34.0)
MCHC: 33.8 g/dL (ref 30.0–36.0)
MCHC: 33.9 g/dL (ref 30.0–36.0)
MCV: 88.5 fL (ref 80.0–100.0)
MCV: 88.7 fL (ref 80.0–100.0)
Platelets: 211 10*3/uL (ref 150–400)
Platelets: 212 10*3/uL (ref 150–400)
RBC: 3.79 MIL/uL — ABNORMAL LOW (ref 3.87–5.11)
RBC: 4.35 MIL/uL (ref 3.87–5.11)
RDW: 12.4 % (ref 11.5–15.5)
RDW: 12.6 % (ref 11.5–15.5)
WBC: 11.1 10*3/uL — ABNORMAL HIGH (ref 4.0–10.5)
WBC: 9.3 10*3/uL (ref 4.0–10.5)
nRBC: 0 % (ref 0.0–0.2)
nRBC: 0 % (ref 0.0–0.2)

## 2019-11-20 LAB — CK: Total CK: 57 U/L (ref 38–234)

## 2019-11-20 LAB — URINE CULTURE: Culture: NO GROWTH

## 2019-11-20 LAB — CREATININE, SERUM
Creatinine, Ser: 0.69 mg/dL (ref 0.44–1.00)
GFR calc Af Amer: >60 mL/min (ref >60)
GFR calc non Af Amer: >60 mL/min (ref >60)

## 2019-11-20 LAB — C DIFFICILE QUICK SCREEN W PCR REFLEX
C Diff antigen: POSITIVE — AB
C Diff interpretation: DETECTED
C Diff toxin: POSITIVE — AB

## 2019-11-20 LAB — TROPONIN I (HIGH SENSITIVITY): Troponin I (High Sensitivity): 17 ng/L (ref <18)

## 2019-11-20 MED ORDER — VANCOMYCIN 50 MG/ML ORAL SOLUTION: 125.0000 mg | Freq: Every day | ORAL | Status: DC

## 2019-11-20 MED ORDER — VANCOMYCIN 50 MG/ML ORAL SOLUTION: 125.0000 mg | Freq: Two times a day (BID) | ORAL | Status: DC

## 2019-11-20 MED ORDER — VANCOMYCIN 50 MG/ML ORAL SOLUTION: 125.0000 mg | ORAL | Status: DC

## 2019-11-20 MED ORDER — VANCOMYCIN 50 MG/ML ORAL SOLUTION
125.0000 mg | Freq: Four times a day (QID) | ORAL | Status: DC
  Administered 2019-11-20 – 2019-11-25 (×18): 125 mg via ORAL
  Filled 2019-11-20 (×24): qty 2.5

## 2019-11-20 NOTE — Progress Notes (Signed)
PROGRESS NOTE    [name]  JOA:[id number] DOB: 1941-03-27 DOA: 11/19/2019 PCP: Lauree Chandler, NP     Brief Narrative:  79 y.o. WF PMHx Fibromyalgia, cirrhosis of the liver secondary to [facility], esophageal varices, HTN, HLD,, asthma, irritable bowel syndrome, presents to the ER because of ongoing weakness for the last 1 week where patient was feeling intensely fatigued.  Patient over the last 1 week he has been having increased frequency of urination and diarrhea.  Diarrhea is watery.  Denies any chest pain or shortness of breath.  Denies any loss of consciousness.  Given the worsening symptoms patient presents to the ER.  ED Course: In the ER patient is found to be febrile with a temperature 102 F and lab work show sodium 129 WBC 11.9 lactic acid was normal EKG shows normal sinus rhythm.  Chest x-ray was unremarkable UA is going which is concerning for UTI.  Given the symptoms patient had blood cultures drawn started on fluids empiric antibiotics admitted for further management.  Subjective: T-max overnight 38.9 C, A/O x4, negative nausea, negative vomiting.  States diarrhea began Monday.  Patient lives in an SNF so unsure if any other residents are sick.   Assessment & Plan: Covid vaccination; positive vaccination   Active Problems:   Essential hypertension   Esophageal varices without bleeding (HCC)   Fever   Acute lower UTI   Diarrhea   C. difficile colitis   Other cirrhosis of liver (HCC)  C. difficile colitis  -Stool test consistent with C. difficile colitis -We will begin vancomycin taper per protocol  Hyponatremia -Most likely secondary to dehydration from diarrhea. -Continue normal saline 31m/hr Recent Labs  Lab 11/19/19 1121 11/20/19 0555  NA 129* 133*   Ascites? -UKoreaabdomen negative for ascites see results below  Liver cirrhosis -Amlodipine 2.5 mg daily -Hydralazine PRN -Propranolol 10 mg BID  Essential HTN -See liver  cirrhosis  Asthma -Currently not an issue    DVT prophylaxis: Lovenox Code Status: DNR Family Communication:  Status is: Inpatient    Dispo: The patient is from: SNF              Anticipated d/c is to: SNF              Anticipated d/c date is: 7/17              Patient currently unstable      Consultants:    Procedures/Significant Events:  7/9 CXR; negative cardiopulmonary disease 7/10 UKoreaascites (abdomen); negative for ascites   I have personally reviewed and interpreted all radiology studies and my findings are as above.  VENTILATOR SETTINGS:    Cultures 7/10 positive C. difficile antigen, positive C. difficile toxin   Antimicrobials: Anti-infectives (From admission, onward)   Start     Ordered Stop   12/27/19 1000  vancomycin (VANCOCIN) 50 mg/mL oral solution 125 mg     Discontinue    "Followed by" Linked Group Details   11/20/19 1805 01/05/20 0959   12/19/19 1000  vancomycin (VANCOCIN) 50 mg/mL oral solution 125 mg     Discontinue    "Followed by" Linked Group Details   11/20/19 1805 12/27/19 0959   12/12/19 1000  vancomycin (VANCOCIN) 50 mg/mL oral solution 125 mg     Discontinue    "Followed by" Linked Group Details   11/20/19 1805 12/19/19 0959   12/04/19 2200  vancomycin (VANCOCIN) 50 mg/mL oral solution 125 mg     Discontinue    "  Followed by" Linked Group Details   11/20/19 1805 12/11/19 2159   11/20/19 2000  vancomycin (VANCOCIN) 50 mg/mL oral solution 125 mg     Discontinue    "Followed by" Linked Group Details   11/20/19 1805 12/04/19 2159   11/20/19 1000  vancomycin (VANCOREADY) IVPB 500 mg/100 mL  Status:  Discontinued        11/19/19 2029 11/20/19 1805   11/19/19 2030  vancomycin (VANCOREADY) IVPB 1250 mg/250 mL        11/19/19 2028 11/19/19 2310   11/19/19 1545  cefTRIAXone (ROCEPHIN) 1 g in sodium chloride 0.9 % 100 mL IVPB        11/19/19 1543 11/19/19 1956   11/19/19 0000  cephALEXin (KEFLEX) 500 MG capsule     Discontinue      11/19/19 1754 11/24/19 2359      Devices   LINES / TUBES:      Continuous Infusions:  sodium chloride 75 mL/hr at 11/20/19 0003     Objective: Vitals:   11/20/19 0009 11/20/19 0658 11/20/19 1238 11/20/19 1857  BP: (!) 148/97 (!) 121/52 (!) 107/44 (!) 118/49  Pulse: 69 73 69 67  Resp: 17 17 16 16   Temp: 99.2 F (37.3 C) 98.4 F (36.9 C) 97.7 F (36.5 C) (!) 97.4 F (36.3 C)  TempSrc: Oral Oral Oral Oral  SpO2: 97% 96% 99% 98%  Weight:      Height:        Intake/Output Summary (Last 24 hours) at 11/20/2019 2017 Last data filed at 11/20/2019 1500 Gross per 24 hour  Intake 1278.22 ml  Output --  Net 1278.22 ml   Filed Weights   11/19/19 1106  Weight: 68 kg    Examination:  General: A/O x4 no acute respiratory distress Eyes: negative scleral hemorrhage, negative anisocoria, negative icterus ENT: Negative Runny nose, negative gingival bleeding, Neck:  Negative scars, masses, torticollis, lymphadenopathy, JVD Lungs: Clear to auscultation bilaterally without wheezes or crackles Cardiovascular: Regular rate and rhythm without murmur gallop or rub normal S1 and S2 Abdomen: negative abdominal pain, nondistended, positive soft, bowel sounds, no rebound, no ascites, no appreciable mass Extremities: No significant cyanosis, clubbing, or edema bilateral lower extremities Skin: Negative rashes, lesions, ulcers Psychiatric:  Negative depression, negative anxiety, negative fatigue, negative mania  Central nervous system:  Cranial nerves II through XII intact, tongue/uvula midline, all extremities muscle strength 5/5, sensation intact throughout,  negative dysarthria, negative expressive aphasia, negative receptive aphasia.  .     Data Reviewed: Care during the described time interval was provided by me .  I have reviewed this patient's available data, including medical history, events of note, physical examination, and all test results as part of my  evaluation.  CBC: Recent Labs  Lab 11/19/19 1121 11/19/19 2342 11/20/19 0555  WBC 11.9* 11.1* 9.3  HGB 13.2 13.0 11.4*  HCT 38.3 38.5 33.6*  MCV 87.2 88.5 88.7  PLT 244 212 824   Basic Metabolic Panel: Recent Labs  Lab 11/19/19 1121 11/19/19 2342 11/20/19 0555  NA 129*  --  133*  K 3.5  --  3.6  CL 94*  --  100  CO2 25  --  24  GLUCOSE 151*  --  107*  BUN 11  --  11  CREATININE 0.79 0.69 0.64  CALCIUM 8.7*  --  8.4*   GFR: Estimated Creatinine Clearance: 51 mL/min (by C-G formula based on SCr of 0.64 mg/dL). Liver Function Tests: Recent Labs  Lab 11/19/19 1121  AST 24  ALT 18  ALKPHOS 59  BILITOT 1.0  PROT 6.3*  ALBUMIN 3.7   Recent Labs  Lab 11/19/19 1121  LIPASE 35   No results for input(s): AMMONIA in the last 168 hours. Coagulation Profile: No results for input(s): INR, PROTIME in the last 168 hours. Cardiac Enzymes: Recent Labs  Lab 11/20/19 0555  CKTOTAL 57   BNP (last 3 results) No results for input(s): PROBNP in the last 8760 hours. HbA1C: No results for input(s): HGBA1C in the last 72 hours. CBG: No results for input(s): GLUCAP in the last 168 hours. Lipid Profile: No results for input(s): CHOL, HDL, LDLCALC, TRIG, CHOLHDL, LDLDIRECT in the last 72 hours. Thyroid Function Tests: No results for input(s): TSH, T4TOTAL, FREET4, T3FREE, THYROIDAB in the last 72 hours. Anemia Panel: No results for input(s): VITAMINB12, FOLATE, FERRITIN, TIBC, IRON, RETICCTPCT in the last 72 hours. Sepsis Labs: Recent Labs  Lab 11/19/19 1121 11/19/19 2005  LATICACIDVEN 1.2 1.1    Recent Results (from the past 240 hour(s))  Urine culture     Status: None   Collection Time: 11/19/19 12:20 PM   Specimen: Urine, Random  Result Value Ref Range Status   Specimen Description URINE, RANDOM  Final   Special Requests NONE  Final   Culture   Final    NO GROWTH Performed at [facility], [address], [place], [place] [address]    Report Status  11/20/2019 FINAL  Final  SARS Coronavirus 2 by RT PCR (hospital order, performed in [facility] hospital lab) Nasopharyngeal Nasopharyngeal Swab     Status: None   Collection Time: 11/19/19 12:20 PM   Specimen: Nasopharyngeal Swab  Result Value Ref Range Status   SARS Coronavirus 2 NEGATIVE NEGATIVE Final    Comment: (NOTE) SARS-CoV-2 target nucleic acids are NOT DETECTED.  The SARS-CoV-2 RNA is generally detectable in upper and lower respiratory specimens during the acute phase of infection. The lowest concentration of SARS-CoV-2 viral copies this assay can detect is 250 copies / mL. A negative result does not preclude SARS-CoV-2 infection and should not be used as the sole basis for treatment or other patient management decisions.  A negative result may occur with improper specimen collection / handling, submission of specimen other than nasopharyngeal swab, presence of viral mutation(s) within the areas targeted by this assay, and inadequate number of viral copies (<250 copies / mL). A negative result must be combined with clinical observations, patient history, and epidemiological information.  Fact Sheet for Patients:   StrictlyIdeas.no  Fact Sheet for Healthcare Providers: BankingDealers.co.za  This test is not yet approved or  cleared by the [place] FDA and has been authorized for detection and/or diagnosis of SARS-CoV-2 by FDA under an Emergency Use Authorization (EUA).  This EUA will remain in effect (meaning this test can be used) for the duration of the COVID-19 declaration under Section 564(b)(1) of the Act, 21 U.S.C. section 360bbb-3(b)(1), unless the authorization is terminated or revoked sooner.  Performed at [facility], [place] [address], [place], [place] [address]   Culture, blood (routine x 2)     Status: None (Preliminary result)   Collection Time: 11/19/19  8:00 PM   Specimen: BLOOD LEFT HAND  Result  Value Ref Range Status   Specimen Description BLOOD LEFT HAND  Final   Special Requests   Final    BOTTLES DRAWN AEROBIC AND ANAEROBIC Blood Culture adequate volume   Culture   Final  NO GROWTH < 24 HOURS Performed at [facility] [address] [place], [place] [address]    Report Status PENDING  Incomplete  Culture, blood (routine x 2)     Status: None (Preliminary result)   Collection Time: 11/19/19  9:00 PM   Specimen: BLOOD RIGHT HAND  Result Value Ref Range Status   Specimen Description BLOOD RIGHT HAND  Final   Special Requests   Final    BOTTLES DRAWN AEROBIC AND ANAEROBIC Blood Culture adequate volume   Culture   Final    NO GROWTH < 24 HOURS Performed at [facility], [place] [address], [place], [place] [address]    Report Status PENDING  Incomplete  Gastrointestinal Panel by PCR , Stool     Status: Abnormal   Collection Time: 11/20/19  3:56 PM   Specimen: STOOL  Result Value Ref Range Status   Campylobacter species DETECTED (A) NOT DETECTED Final    Comment: RESULT CALLED TO, READ BACK BY AND VERIFIED WITH: MELON BONADO @2011  11/20/19 MJU    Plesimonas shigelloides NOT DETECTED NOT DETECTED Final   Salmonella species NOT DETECTED NOT DETECTED Final   Yersinia enterocolitica NOT DETECTED NOT DETECTED Final   Vibrio species NOT DETECTED NOT DETECTED Final   Vibrio cholerae NOT DETECTED NOT DETECTED Final   Enteroaggregative E coli (EAEC) NOT DETECTED NOT DETECTED Final   Enteropathogenic E coli (EPEC) NOT DETECTED NOT DETECTED Final   Enterotoxigenic E coli (ETEC) NOT DETECTED NOT DETECTED Final   Shiga like toxin producing E coli (STEC) NOT DETECTED NOT DETECTED Final   Shigella/Enteroinvasive E coli (EIEC) NOT DETECTED NOT DETECTED Final   Cryptosporidium NOT DETECTED NOT DETECTED Final   Cyclospora cayetanensis NOT DETECTED NOT DETECTED Final   Entamoeba histolytica NOT DETECTED NOT DETECTED Final   Giardia lamblia NOT DETECTED NOT DETECTED Final    Adenovirus F40/41 NOT DETECTED NOT DETECTED Final   Astrovirus NOT DETECTED NOT DETECTED Final   Norovirus GI/GII NOT DETECTED NOT DETECTED Final   Rotavirus A NOT DETECTED NOT DETECTED Final   Sapovirus (I, II, IV, and V) NOT DETECTED NOT DETECTED Final    Comment: Performed at [facility], [address] [place], [place] [address]  C Difficile Quick Screen w PCR reflex     Status: Abnormal   Collection Time: 11/20/19  3:56 PM   Specimen: STOOL  Result Value Ref Range Status   C Diff antigen POSITIVE (A) NEGATIVE Final   C Diff toxin POSITIVE (A) NEGATIVE Final   C Diff interpretation Toxin producing C. difficile detected.  Final    Comment: CRITICAL RESULT CALLED TO, READ BACK BY AND VERIFIED WITH: RN ALA ABU AT 1724 11/20/19 BY MM Performed at [facility], [address] [place], [place] [address]          Radiology Studies: DG Chest 2 View  Result Date: 11/19/2019 CLINICAL DATA:  Weakness. EXAM: CHEST - 2 VIEW COMPARISON:  March 28, 2018. FINDINGS: The heart size and mediastinal contours are within normal limits. Both lungs are clear. No pneumothorax pleural effusion is noted. Atherosclerosis of thoracic aorta is noted. The visualized skeletal structures are unremarkable. IMPRESSION: No active cardiopulmonary disease. Aortic Atherosclerosis (ICD10-I70.0). Electronically Signed   By: Marijo Conception M.D.   On: 11/19/2019 12:04   [place] ASCITES (ABDOMEN LIMITED)  Result Date: 11/20/2019 CLINICAL DATA:  Abdominal pain EXAM: LIMITED ABDOMEN ULTRASOUND FOR ASCITES TECHNIQUE: Limited ultrasound survey for ascites was performed  in all four abdominal quadrants. COMPARISON:  None. FINDINGS: No ascites visualized by the sonographer. IMPRESSION: No ascites visualized. Electronically Signed   By: Ulyses Jarred M.D.   On: 11/20/2019 06:48        Scheduled Meds:  amLODipine  2.5 mg Oral Daily   enoxaparin (LOVENOX) injection  40 mg Subcutaneous QHS   losartan  25 mg  Oral Daily   mometasone-formoterol  2 puff Inhalation BID   omega-3 acid ethyl esters  2 g Oral BID   pantoprazole  40 mg Oral Daily   propranolol  10 mg Oral BID   vancomycin  125 mg Oral QID   Followed by   Derrill Memo ON 12/04/2019] vancomycin  125 mg Oral BID   Followed by   Derrill Memo ON 12/12/2019] vancomycin  125 mg Oral Daily   Followed by   Derrill Memo ON 12/19/2019] vancomycin  125 mg Oral QODAY   Followed by   Derrill Memo ON 12/27/2019] vancomycin  125 mg Oral Q3 days   Continuous Infusions:  sodium chloride 75 mL/hr at 11/20/19 0003     LOS: 0 days    Time spent:40 min    Abcde Oneil, Geraldo Docker, MD [facility] Pager [phone]  If 7PM-7AM, please contact night-coverage [email or web address] Password [email or web address] 11/20/2019, 8:17 PM

## 2019-11-21 LAB — COMPREHENSIVE METABOLIC PANEL
ALT: 14 U/L (ref 0–44)
AST: 23 U/L (ref 15–41)
Albumin: 2.7 g/dL — ABNORMAL LOW (ref 3.5–5.0)
Alkaline Phosphatase: 44 U/L (ref 38–126)
Anion gap: 8 (ref 5–15)
BUN: 7 mg/dL — ABNORMAL LOW (ref 8–23)
CO2: 21 mmol/L — ABNORMAL LOW (ref 22–32)
Calcium: 8.4 mg/dL — ABNORMAL LOW (ref 8.9–10.3)
Chloride: 102 mmol/L (ref 98–111)
Creatinine, Ser: 0.54 mg/dL (ref 0.44–1.00)
GFR calc Af Amer: >60 mL/min (ref >60)
GFR calc non Af Amer: >60 mL/min (ref >60)
Glucose, Bld: 108 mg/dL — ABNORMAL HIGH (ref 70–99)
Potassium: 2.7 mmol/L — CL (ref 3.5–5.1)
Sodium: 131 mmol/L — ABNORMAL LOW (ref 135–145)
Total Bilirubin: 0.5 mg/dL (ref 0.3–1.2)
Total Protein: 5 g/dL — ABNORMAL LOW (ref 6.5–8.1)

## 2019-11-21 LAB — CBC WITH DIFFERENTIAL/PLATELET
Abs Immature Granulocytes: 0.02 10*3/uL (ref 0.00–0.07)
Basophils Absolute: 0 10*3/uL (ref 0.0–0.1)
Basophils Relative: 0 %
Eosinophils Absolute: 0.1 10*3/uL (ref 0.0–0.5)
Eosinophils Relative: 1 %
HCT: 30.9 % — ABNORMAL LOW (ref 36.0–46.0)
Hemoglobin: 10.8 g/dL — ABNORMAL LOW (ref 12.0–15.0)
Immature Granulocytes: 0 %
Lymphocytes Relative: 33 %
Lymphs Abs: 3 10*3/uL (ref 0.7–4.0)
MCH: 30.7 pg (ref 26.0–34.0)
MCHC: 35 g/dL (ref 30.0–36.0)
MCV: 87.8 fL (ref 80.0–100.0)
Monocytes Absolute: 1.5 10*3/uL — ABNORMAL HIGH (ref 0.1–1.0)
Monocytes Relative: 16 %
Neutro Abs: 4.5 10*3/uL (ref 1.7–7.7)
Neutrophils Relative %: 50 %
Platelets: 210 10*3/uL (ref 150–400)
RBC: 3.52 MIL/uL — ABNORMAL LOW (ref 3.87–5.11)
RDW: 12.6 % (ref 11.5–15.5)
WBC: 9 10*3/uL (ref 4.0–10.5)
nRBC: 0 % (ref 0.0–0.2)

## 2019-11-21 LAB — BASIC METABOLIC PANEL
Anion gap: 8 (ref 5–15)
BUN: 6 mg/dL — ABNORMAL LOW (ref 8–23)
CO2: 22 mmol/L (ref 22–32)
Calcium: 8.7 mg/dL — ABNORMAL LOW (ref 8.9–10.3)
Chloride: 105 mmol/L (ref 98–111)
Creatinine, Ser: 0.68 mg/dL (ref 0.44–1.00)
GFR calc Af Amer: >60 mL/min (ref >60)
GFR calc non Af Amer: >60 mL/min (ref >60)
Glucose, Bld: 158 mg/dL — ABNORMAL HIGH (ref 70–99)
Potassium: 3.9 mmol/L (ref 3.5–5.1)
Sodium: 135 mmol/L (ref 135–145)

## 2019-11-21 LAB — MAGNESIUM
Magnesium: 1.7 mg/dL (ref 1.7–2.4)
Magnesium: 1.7 mg/dL (ref 1.7–2.4)

## 2019-11-21 LAB — PHOSPHORUS: Phosphorus: 2.4 mg/dL — ABNORMAL LOW (ref 2.5–4.6)

## 2019-11-21 MED ORDER — POTASSIUM CHLORIDE 10 MEQ/100ML IV SOLN
10.0000 meq | INTRAVENOUS | Status: AC
  Administered 2019-11-21 (×2): 10 meq via INTRAVENOUS
  Filled 2019-11-21 (×2): qty 100

## 2019-11-21 MED ORDER — POTASSIUM CHLORIDE 10 MEQ/100ML IV SOLN: 10.0000 meq | INTRAVENOUS | Status: DC

## 2019-11-21 MED ORDER — MAGNESIUM SULFATE IN D5W 1-5 GM/100ML-% IV SOLN
1.0000 g | Freq: Once | INTRAVENOUS | Status: AC
  Administered 2019-11-21: 1 g via INTRAVENOUS
  Filled 2019-11-21: qty 100

## 2019-11-21 MED ORDER — POTASSIUM CHLORIDE CRYS ER 20 MEQ PO TBCR
40.0000 meq | EXTENDED_RELEASE_TABLET | Freq: Once | ORAL | Status: AC
  Administered 2019-11-21: 40 meq via ORAL
  Filled 2019-11-21: qty 2

## 2019-11-21 NOTE — Progress Notes (Signed)
CRITICAL VALUE ALERT  Critical Value:  Potassium 2.7   Date & Time Notied:  11/21/19  0413  Provider Notified: Dr Myna Hidalgo  Orders Received/Actions taken: paged

## 2019-11-21 NOTE — Progress Notes (Signed)
PROGRESS NOTE    [name]  IOX:[id number] DOB: 1940-07-14 DOA: 11/19/2019 PCP: Lauree Chandler, NP     Brief Narrative:  79 y.o. WF lives in [facility] PMHx Fibromyalgia, cirrhosis of the liver secondary to [place], esophageal varices, HTN, HLD,, asthma, irritable bowel syndrome, presents to the ER because of ongoing weakness for the last 1 week where patient was feeling intensely fatigued.  Patient over the last 1 week he has been having increased frequency of urination and diarrhea.  Diarrhea is watery.  Denies any chest pain or shortness of breath.  Denies any loss of consciousness.  Given the worsening symptoms patient presents to the ER.  ED Course: In the ER patient is found to be febrile with a temperature 102 F and lab work show sodium 129 WBC 11.9 lactic acid was normal EKG shows normal sinus rhythm.  Chest x-ray was unremarkable UA is going which is concerning for UTI.  Given the symptoms patient had blood cultures drawn started on fluids empiric antibiotics admitted for further management.  Subjective: 7/11 afebrile overnight A/O x4, negative nausea, negative vomiting.  Positive continued diarrhea. lives in [facility] where she has significant support to include food deliveries etc.   Assessment & Plan: Covid vaccination; positive vaccination   Active Problems:   Essential hypertension   Esophageal varices without bleeding (HCC)   Fever   Acute lower UTI   Diarrhea   C. difficile colitis   Other cirrhosis of liver (HCC)  C. difficile colitis  -Stool test consistent with C. difficile colitis -We will begin vancomycin taper per protocol  Hyponatremia -Most likely secondary to dehydration from diarrhea. -Continue normal saline 43m/hr Recent Labs  Lab 11/19/19 1121 11/20/19 0555 11/21/19 0237  NA 129* 133* 131*   Hypokalemia -Potassium IV 40 mEq   -K-Dur 40 mEq  Hypomagnesmia -Magnesium IV 2 g  Ascites? -UKoreaabdomen negative for ascites see results  below  Liver cirrhosis -Amlodipine 2.5 mg daily -Hydralazine PRN -Propranolol 10 mg BID  Essential HTN -See liver cirrhosis  Asthma -Currently not an issue    DVT prophylaxis: Lovenox Code Status: DNR Family Communication:  Status is: Inpatient    Dispo: The patient is from: SNF              Anticipated d/c is to: SNF              Anticipated d/c date is: 7/17              Patient currently unstable      Consultants:    Procedures/Significant Events:  7/9 CXR; negative cardiopulmonary disease 7/10 UKoreaascites (abdomen); negative for ascites   I have personally reviewed and interpreted all radiology studies and my findings are as above.  VENTILATOR SETTINGS:    Cultures 7/10 positive C. difficile antigen, positive C. difficile toxin   Antimicrobials: Anti-infectives (From admission, onward)   Start     Ordered Stop   12/27/19 1000  vancomycin (VANCOCIN) 50 mg/mL oral solution 125 mg     Discontinue    "Followed by" Linked Group Details   11/20/19 1805 01/05/20 0959   12/19/19 1000  vancomycin (VANCOCIN) 50 mg/mL oral solution 125 mg     Discontinue    "Followed by" Linked Group Details   11/20/19 1805 12/27/19 0959   12/12/19 1000  vancomycin (VANCOCIN) 50 mg/mL oral solution 125 mg     Discontinue    "Followed by" Linked Group Details   11/20/19 1805  12/19/19 0959   12/04/19 2200  vancomycin (VANCOCIN) 50 mg/mL oral solution 125 mg     Discontinue    "Followed by" Linked Group Details   11/20/19 1805 12/11/19 2159   11/20/19 2000  vancomycin (VANCOCIN) 50 mg/mL oral solution 125 mg     Discontinue    "Followed by" Linked Group Details   11/20/19 1805 12/04/19 2159   11/20/19 1000  vancomycin (VANCOREADY) IVPB 500 mg/100 mL  Status:  Discontinued        11/19/19 2029 11/20/19 1805   11/19/19 2030  vancomycin (VANCOREADY) IVPB 1250 mg/250 mL        11/19/19 2028 11/19/19 2310   11/19/19 1545  cefTRIAXone (ROCEPHIN) 1 g in sodium chloride 0.9 % 100  mL IVPB        11/19/19 1543 11/19/19 1956   11/19/19 0000  cephALEXin (KEFLEX) 500 MG capsule     Discontinue     11/19/19 1754 11/24/19 2359      Devices   LINES / TUBES:      Continuous Infusions: . potassium chloride       Objective: Vitals:   11/20/19 1857 11/20/19 2213 11/21/19 0453 11/21/19 1053  BP: (!) 118/49 (!) 131/56 (!) 137/55 (!) 107/54  Pulse: 67 62 60 (!) 57  Resp: 16 18 18 18   Temp: (!) 97.4 F (36.3 C) 98.3 F (36.8 C) 98.1 F (36.7 C) 98.5 F (36.9 C)  TempSrc: Oral Oral Oral Oral  SpO2: 98% 98% 96% 98%  Weight:      Height:        Intake/Output Summary (Last 24 hours) at 11/21/2019 1822 Last data filed at 11/21/2019 1700 Gross per 24 hour  Intake 1080 ml  Output --  Net 1080 ml   Filed Weights   11/19/19 1106  Weight: 68 kg   Physical Exam:  General: A/O x4, No acute respiratory distress Eyes: negative scleral hemorrhage, negative anisocoria, negative icterus ENT: Negative Runny nose, negative gingival bleeding, Neck:  Negative scars, masses, torticollis, lymphadenopathy, JVD Lungs: Clear to auscultation bilaterally without wheezes or crackles Cardiovascular: Regular rate and rhythm without murmur gallop or rub normal S1 and S2 Abdomen: negative abdominal pain, nondistended, positive soft, bowel sounds, no rebound, no ascites, no appreciable mass, positive continued diarrhea Extremities: No significant cyanosis, clubbing, or edema bilateral lower extremities Skin: Negative rashes, lesions, ulcers Psychiatric:  Negative depression, negative anxiety, negative fatigue, negative mania  Central nervous system:  Cranial nerves II through XII intact, tongue/uvula midline, all extremities muscle strength 5/5, sensation intact throughout, negative dysarthria, negative expressive aphasia, negative receptive aphasia.  .     Data Reviewed: Care during the described time interval was provided by me .  I have reviewed this patient's available  data, including medical history, events of note, physical examination, and all test results as part of my evaluation.  CBC: Recent Labs  Lab 11/19/19 1121 11/19/19 2342 11/20/19 0555 11/21/19 0237  WBC 11.9* 11.1* 9.3 9.0  NEUTROABS  --   --   --  4.5  HGB 13.2 13.0 11.4* 10.8*  HCT 38.3 38.5 33.6* 30.9*  MCV 87.2 88.5 88.7 87.8  PLT 244 212 211 035   Basic Metabolic Panel: Recent Labs  Lab 11/19/19 1121 11/19/19 2342 11/20/19 0555 11/21/19 0237  NA 129*  --  133* 131*  K 3.5  --  3.6 2.7*  CL 94*  --  100 102  CO2 25  --  24 21*  GLUCOSE 151*  --  107* 108*  BUN 11  --  11 7*  CREATININE 0.79 0.69 0.64 0.54  CALCIUM 8.7*  --  8.4* 8.4*  MG  --   --   --  1.7  PHOS  --   --   --  2.4*   GFR: Estimated Creatinine Clearance: 51 mL/min (by C-G formula based on SCr of 0.54 mg/dL). Liver Function Tests: Recent Labs  Lab 11/19/19 1121 11/21/19 0237  AST 24 23  ALT 18 14  ALKPHOS 59 44  BILITOT 1.0 0.5  PROT 6.3* 5.0*  ALBUMIN 3.7 2.7*   Recent Labs  Lab 11/19/19 1121  LIPASE 35   No results for input(s): AMMONIA in the last 168 hours. Coagulation Profile: No results for input(s): INR, PROTIME in the last 168 hours. Cardiac Enzymes: Recent Labs  Lab 11/20/19 0555  CKTOTAL 57   BNP (last 3 results) No results for input(s): PROBNP in the last 8760 hours. HbA1C: No results for input(s): HGBA1C in the last 72 hours. CBG: No results for input(s): GLUCAP in the last 168 hours. Lipid Profile: No results for input(s): CHOL, HDL, LDLCALC, TRIG, CHOLHDL, LDLDIRECT in the last 72 hours. Thyroid Function Tests: No results for input(s): TSH, T4TOTAL, FREET4, T3FREE, THYROIDAB in the last 72 hours. Anemia Panel: No results for input(s): VITAMINB12, FOLATE, FERRITIN, TIBC, IRON, RETICCTPCT in the last 72 hours. Sepsis Labs: Recent Labs  Lab 11/19/19 1121 11/19/19 2005  LATICACIDVEN 1.2 1.1    Recent Results (from the past 240 hour(s))  Urine culture      Status: None   Collection Time: 11/19/19 12:20 PM   Specimen: Urine, Random  Result Value Ref Range Status   Specimen Description URINE, RANDOM  Final   Special Requests NONE  Final   Culture   Final    NO GROWTH Performed at [facility], [address] [place], [place] [address]    Report Status 11/20/2019 FINAL  Final  SARS Coronavirus 2 by RT PCR (hospital order, performed in [facility] hospital lab) Nasopharyngeal Nasopharyngeal Swab     Status: None   Collection Time: 11/19/19 12:20 PM   Specimen: Nasopharyngeal Swab  Result Value Ref Range Status   SARS Coronavirus 2 NEGATIVE NEGATIVE Final    Comment: (NOTE) SARS-CoV-2 target nucleic acids are NOT DETECTED.  The SARS-CoV-2 RNA is generally detectable in upper and lower respiratory specimens during the acute phase of infection. The lowest concentration of SARS-CoV-2 viral copies this assay can detect is 250 copies / mL. A negative result does not preclude SARS-CoV-2 infection and should not be used as the sole basis for treatment or other patient management decisions.  A negative result may occur with improper specimen collection / handling, submission of specimen other than nasopharyngeal swab, presence of viral mutation(s) within the areas targeted by this assay, and inadequate number of viral copies (<250 copies / mL). A negative result must be combined with clinical observations, patient history, and epidemiological information.  Fact Sheet for Patients:   StrictlyIdeas.no  Fact Sheet for Healthcare Providers: BankingDealers.co.za  This test is not yet approved or  cleared by the [place] FDA and has been authorized for detection and/or diagnosis of SARS-CoV-2 by FDA under an Emergency Use Authorization (EUA).  This EUA will remain in effect (meaning this test can be used) for the duration of the COVID-19 declaration under Section 564(b)(1) of the Act, 21  U.S.C. section 360bbb-3(b)(1), unless the authorization is terminated or revoked sooner.  Performed  at [facility], [place] [address] [place], [place] [address]   Culture, blood (routine x 2)     Status: None (Preliminary result)   Collection Time: 11/19/19  8:00 PM   Specimen: BLOOD LEFT HAND  Result Value Ref Range Status   Specimen Description BLOOD LEFT HAND  Final   Special Requests   Final    BOTTLES DRAWN AEROBIC AND ANAEROBIC Blood Culture adequate volume   Culture   Final    NO GROWTH 2 DAYS Performed at [facility], [place] [address], [place], [place] [address]    Report Status PENDING  Incomplete  Culture, blood (routine x 2)     Status: None (Preliminary result)   Collection Time: 11/19/19  9:00 PM   Specimen: BLOOD RIGHT HAND  Result Value Ref Range Status   Specimen Description BLOOD RIGHT HAND  Final   Special Requests   Final    BOTTLES DRAWN AEROBIC AND ANAEROBIC Blood Culture adequate volume   Culture   Final    NO GROWTH 2 DAYS Performed at [facility], [place] [address], [place], [place] [address]    Report Status PENDING  Incomplete  Gastrointestinal Panel by PCR , Stool     Status: Abnormal   Collection Time: 11/20/19  3:56 PM   Specimen: STOOL  Result Value Ref Range Status   Campylobacter species DETECTED (A) NOT DETECTED Final    Comment: RESULT CALLED TO, READ BACK BY AND VERIFIED WITH: MELON BONADO @2011  11/20/19 MJU    Plesimonas shigelloides NOT DETECTED NOT DETECTED Final   Salmonella species NOT DETECTED NOT DETECTED Final   Yersinia enterocolitica NOT DETECTED NOT DETECTED Final   Vibrio species NOT DETECTED NOT DETECTED Final   Vibrio cholerae NOT DETECTED NOT DETECTED Final   Enteroaggregative E coli (EAEC) NOT DETECTED NOT DETECTED Final   Enteropathogenic E coli (EPEC) NOT DETECTED NOT DETECTED Final   Enterotoxigenic E coli (ETEC) NOT DETECTED NOT DETECTED Final   Shiga like toxin producing E coli (STEC) NOT DETECTED NOT  DETECTED Final   Shigella/Enteroinvasive E coli (EIEC) NOT DETECTED NOT DETECTED Final   Cryptosporidium NOT DETECTED NOT DETECTED Final   Cyclospora cayetanensis NOT DETECTED NOT DETECTED Final   Entamoeba histolytica NOT DETECTED NOT DETECTED Final   Giardia lamblia NOT DETECTED NOT DETECTED Final   Adenovirus F40/41 NOT DETECTED NOT DETECTED Final   Astrovirus NOT DETECTED NOT DETECTED Final   Norovirus GI/GII NOT DETECTED NOT DETECTED Final   Rotavirus A NOT DETECTED NOT DETECTED Final   Sapovirus (I, II, IV, and V) NOT DETECTED NOT DETECTED Final    Comment: Performed at [facility], [address] [place], [place] [address]  C Difficile Quick Screen w PCR reflex     Status: Abnormal   Collection Time: 11/20/19  3:56 PM   Specimen: STOOL  Result Value Ref Range Status   C Diff antigen POSITIVE (A) NEGATIVE Final   C Diff toxin POSITIVE (A) NEGATIVE Final   C Diff interpretation Toxin producing C. difficile detected.  Final    Comment: CRITICAL RESULT CALLED TO, READ BACK BY AND VERIFIED WITH: RN ALA ABU AT 1724 11/20/19 BY MM Performed at [facility], [address] [place],  [address]          Radiology Studies: [place] ASCITES (ABDOMEN LIMITED)  Result Date: 11/20/2019 CLINICAL DATA:  Abdominal pain EXAM: LIMITED ABDOMEN ULTRASOUND FOR ASCITES TECHNIQUE: Limited ultrasound survey for ascites  was performed in all four abdominal quadrants. COMPARISON:  None. FINDINGS: No ascites visualized by the sonographer. IMPRESSION: No ascites visualized. Electronically Signed   By: Ulyses Jarred M.D.   On: 11/20/2019 06:48        Scheduled Meds: . amLODipine  2.5 mg Oral Daily  . enoxaparin (LOVENOX) injection  40 mg Subcutaneous QHS  . losartan  25 mg Oral Daily  . mometasone-formoterol  2 puff Inhalation BID  . omega-3 acid ethyl esters  2 g Oral BID  . propranolol  10 mg Oral BID  . vancomycin  125 mg Oral QID   Followed by  . [START ON 12/04/2019]  vancomycin  125 mg Oral BID   Followed by  . [START ON 12/12/2019] vancomycin  125 mg Oral Daily   Followed by  . [START ON 12/19/2019] vancomycin  125 mg Oral QODAY   Followed by  . [START ON 12/27/2019] vancomycin  125 mg Oral Q3 days   Continuous Infusions: . potassium chloride       LOS: 1 day    Time spent:40 min    Louie Flenner, Geraldo Docker, MD [facility] Pager [phone]  If 7PM-7AM, please contact night-coverage [email or web address] Password [facility] 11/21/2019, 6:22 PM

## 2019-11-22 ENCOUNTER — Inpatient Hospital Stay (HOSPITAL_COMMUNITY): Payer: PPO

## 2019-11-22 DIAGNOSIS — I4891 Unspecified atrial fibrillation: Secondary | ICD-10-CM | POA: Diagnosis present

## 2019-11-22 LAB — HEPARIN LEVEL (UNFRACTIONATED): Heparin Unfractionated: 0.12 IU/mL — ABNORMAL LOW (ref 0.30–0.70)

## 2019-11-22 LAB — D-DIMER, QUANTITATIVE: D-Dimer, Quant: 1.45 ug/mL-FEU — ABNORMAL HIGH (ref 0.00–0.50)

## 2019-11-22 LAB — CBC WITH DIFFERENTIAL/PLATELET
Abs Immature Granulocytes: 0.03 10*3/uL (ref 0.00–0.07)
Basophils Absolute: 0 10*3/uL (ref 0.0–0.1)
Basophils Relative: 0 %
Eosinophils Absolute: 0.1 10*3/uL (ref 0.0–0.5)
Eosinophils Relative: 2 %
HCT: 31.7 % — ABNORMAL LOW (ref 36.0–46.0)
Hemoglobin: 10.7 g/dL — ABNORMAL LOW (ref 12.0–15.0)
Immature Granulocytes: 0 %
Lymphocytes Relative: 46 %
Lymphs Abs: 3.7 10*3/uL (ref 0.7–4.0)
MCH: 29.7 pg (ref 26.0–34.0)
MCHC: 33.8 g/dL (ref 30.0–36.0)
MCV: 88.1 fL (ref 80.0–100.0)
Monocytes Absolute: 1.2 10*3/uL — ABNORMAL HIGH (ref 0.1–1.0)
Monocytes Relative: 15 %
Neutro Abs: 2.9 10*3/uL (ref 1.7–7.7)
Neutrophils Relative %: 37 %
Platelets: 222 10*3/uL (ref 150–400)
RBC: 3.6 MIL/uL — ABNORMAL LOW (ref 3.87–5.11)
RDW: 12.8 % (ref 11.5–15.5)
WBC: 8 10*3/uL (ref 4.0–10.5)
nRBC: 0 % (ref 0.0–0.2)

## 2019-11-22 LAB — COMPREHENSIVE METABOLIC PANEL
ALT: 16 U/L (ref 0–44)
AST: 23 U/L (ref 15–41)
Albumin: 2.9 g/dL — ABNORMAL LOW (ref 3.5–5.0)
Alkaline Phosphatase: 62 U/L (ref 38–126)
Anion gap: 8 (ref 5–15)
BUN: <5 mg/dL — ABNORMAL LOW (ref 8–23)
CO2: 24 mmol/L (ref 22–32)
Calcium: 8.6 mg/dL — ABNORMAL LOW (ref 8.9–10.3)
Chloride: 106 mmol/L (ref 98–111)
Creatinine, Ser: 0.72 mg/dL (ref 0.44–1.00)
GFR calc Af Amer: >60 mL/min (ref >60)
GFR calc non Af Amer: >60 mL/min (ref >60)
Glucose, Bld: 102 mg/dL — ABNORMAL HIGH (ref 70–99)
Potassium: 3.9 mmol/L (ref 3.5–5.1)
Sodium: 138 mmol/L (ref 135–145)
Total Bilirubin: 0.6 mg/dL (ref 0.3–1.2)
Total Protein: 5.4 g/dL — ABNORMAL LOW (ref 6.5–8.1)

## 2019-11-22 LAB — TSH: TSH: 1.666 u[IU]/mL (ref 0.350–4.500)

## 2019-11-22 LAB — MAGNESIUM: Magnesium: 1.7 mg/dL (ref 1.7–2.4)

## 2019-11-22 LAB — PHOSPHORUS: Phosphorus: 2.8 mg/dL (ref 2.5–4.6)

## 2019-11-22 LAB — TROPONIN I (HIGH SENSITIVITY)
Troponin I (High Sensitivity): 8 ng/L (ref <18)
Troponin I (High Sensitivity): 7 ng/L (ref <18)

## 2019-11-22 MED ORDER — POTASSIUM CHLORIDE CRYS ER 20 MEQ PO TBCR
40.0000 meq | EXTENDED_RELEASE_TABLET | Freq: Once | ORAL | Status: AC
  Administered 2019-11-22: 40 meq via ORAL
  Filled 2019-11-22: qty 2

## 2019-11-22 MED ORDER — HEPARIN (PORCINE) 25000 UT/250ML-% IV SOLN
1150.0000 [IU]/h | INTRAVENOUS | Status: AC
  Administered 2019-11-22: 900 [IU]/h via INTRAVENOUS
  Administered 2019-11-24 – 2019-11-26 (×3): 1150 [IU]/h via INTRAVENOUS
  Filled 2019-11-22 (×5): qty 250

## 2019-11-22 MED ORDER — MAGNESIUM OXIDE 400 (241.3 MG) MG PO TABS
400.0000 mg | ORAL_TABLET | Freq: Every day | ORAL | Status: DC
  Administered 2019-11-22 – 2019-11-23 (×2): 400 mg via ORAL
  Filled 2019-11-22 (×2): qty 1

## 2019-11-22 MED ORDER — HEPARIN BOLUS VIA INFUSION
1000.0000 [IU] | Freq: Once | INTRAVENOUS | Status: AC
  Administered 2019-11-23: 1000 [IU] via INTRAVENOUS
  Filled 2019-11-22: qty 1000

## 2019-11-22 MED ORDER — SODIUM CHLORIDE 0.9 % IV SOLN: INTRAVENOUS | Status: DC

## 2019-11-22 NOTE — Progress Notes (Signed)
Pt had a 2.48 second pause MD paged.

## 2019-11-22 NOTE — Progress Notes (Signed)
PROGRESS NOTE    [name]  MEQ:[id number] DOB: 17-Jul-1940 DOA: 11/19/2019 PCP: Lauree Chandler, NP     Brief Narrative:  79 y.o. WF lives in [facility] PMHx Fibromyalgia, cirrhosis of the liver secondary to [place], esophageal varices, HTN, HLD,, asthma, irritable bowel syndrome, presents to the ER because of ongoing weakness for the last 1 week where patient was feeling intensely fatigued.  Patient over the last 1 week he has been having increased frequency of urination and diarrhea.  Diarrhea is watery.  Denies any chest pain or shortness of breath.  Denies any loss of consciousness.  Given the worsening symptoms patient presents to the ER.  ED Course: In the ER patient is found to be febrile with a temperature 102 F and lab work show sodium 129 WBC 11.9 lactic acid was normal EKG shows normal sinus rhythm.  Chest x-ray was unremarkable UA is going which is concerning for UTI.  Given the symptoms patient had blood cultures drawn started on fluids empiric antibiotics admitted for further management.  Subjective: 7/12 afebrile overnight A/O x4, negative nausea, negative vomiting, negative chest pain.  Negative S OB.  States could not tell that she was in A. fib earlier today.  To her knowledge has never been in A. fib before.  States positive continued diarrhea consistency of gravy   Assessment & Plan: Covid vaccination; positive vaccination   Active Problems:   Essential hypertension   Esophageal varices without bleeding (HCC)   Fever   Acute lower UTI   Diarrhea   C. difficile colitis   Other cirrhosis of liver ([place])   New onset atrial fibrillation (HCC)  C. difficile colitis  -Stool test consistent with C. difficile colitis -We will begin vancomycin taper per protocol  Hyponatremia -Most likely secondary to dehydration from diarrhea. -Continue normal saline 55m/hr Recent Labs  Lab 11/19/19 1121 11/20/19 0555 11/21/19 0237 11/21/19 1755 11/22/19 0334  NA 129*  133* 131* 135 138   Hypokalemia -Potassium> 4 -K-Dur 40 mEq  Hypomagnesmia -Magnesium> 2 -Magnesium oxide 400 mg  Ascites? -UKoreaabdomen negative for ascites see results below  Liver cirrhosis -Amlodipine 2.5 mg daily -Hydralazine PRN -Propranolol 10 mg BID -7/12 nutrition consult; patient with liver cirrhosis, essential HTN, requests a nutrition consult to help her with appropriate meal planning  Essential HTN  -See liver cirrhosis  New onset atrial fibrillation -Page that there had been a couple episodes of 2-second pauses, asymptomatic -Currently rate controlled -7/12 EKG; NSR with nonspecific ST-T wave changes -Heparin drip per pharmacy -Echocardiogram pending -7/12 TSH normal  Asthma -Currently not an issue    DVT prophylaxis: Heparin drip  Code Status: DNR Family Communication:  Status is: Inpatient    Dispo: The patient is from: SNF              Anticipated d/c is to: SNF              Anticipated d/c date is: 7/17              Patient currently unstable      Consultants:    Procedures/Significant Events:  7/9 CXR; negative cardiopulmonary disease 7/10 UKoreaascites (abdomen); negative for ascites   I have personally reviewed and interpreted all radiology studies and my findings are as above.  VENTILATOR SETTINGS:    Cultures 7/10 positive C. difficile antigen, positive C. difficile toxin   Antimicrobials: Anti-infectives (From admission, onward)   Start     Ordered Stop  12/27/19 1000  vancomycin (VANCOCIN) 50 mg/mL oral solution 125 mg     Discontinue    "Followed by" Linked Group Details   11/20/19 1805 01/05/20 0959   12/19/19 1000  vancomycin (VANCOCIN) 50 mg/mL oral solution 125 mg     Discontinue    "Followed by" Linked Group Details   11/20/19 1805 12/27/19 0959   12/12/19 1000  vancomycin (VANCOCIN) 50 mg/mL oral solution 125 mg     Discontinue    "Followed by" Linked Group Details   11/20/19 1805 12/19/19 0959   12/04/19  2200  vancomycin (VANCOCIN) 50 mg/mL oral solution 125 mg     Discontinue    "Followed by" Linked Group Details   11/20/19 1805 12/11/19 2159   11/20/19 2000  vancomycin (VANCOCIN) 50 mg/mL oral solution 125 mg     Discontinue    "Followed by" Linked Group Details   11/20/19 1805 12/04/19 2159   11/20/19 1000  vancomycin (VANCOREADY) IVPB 500 mg/100 mL  Status:  Discontinued        11/19/19 2029 11/20/19 1805   11/19/19 2030  vancomycin (VANCOREADY) IVPB 1250 mg/250 mL        11/19/19 2028 11/19/19 2310   11/19/19 1545  cefTRIAXone (ROCEPHIN) 1 g in sodium chloride 0.9 % 100 mL IVPB        11/19/19 1543 11/19/19 1956   11/19/19 0000  cephALEXin (KEFLEX) 500 MG capsule     Discontinue     11/19/19 1754 11/24/19 2359      Devices   LINES / TUBES:      Continuous Infusions: . sodium chloride    . heparin 900 Units/hr (11/22/19 1500)     Objective: Vitals:   11/22/19 0045 11/22/19 0825 11/22/19 0905 11/22/19 1321  BP: (!) 136/53  118/74 (!) 117/58  Pulse: 62 91 73 64  Resp: 16 16  16   Temp: 98.1 F (36.7 C)   98.3 F (36.8 C)  TempSrc: Oral   Oral  SpO2:  95%  95%  Weight:      Height:        Intake/Output Summary (Last 24 hours) at 11/22/2019 1636 Last data filed at 11/22/2019 1500 Gross per 24 hour  Intake 364.44 ml  Output --  Net 364.44 ml   Filed Weights   11/19/19 1106  Weight: 68 kg   Physical Exam:  General: A/O x4, no acute respiratory distress Eyes: negative scleral hemorrhage, negative anisocoria, negative icterus ENT: Negative Runny nose, negative gingival bleeding, Neck:  Negative scars, masses, torticollis, lymphadenopathy, JVD Lungs: Clear to auscultation bilaterally without wheezes or crackles Cardiovascular: Regular rate and rhythm without murmur gallop or rub normal S1 and S2 Abdomen: negative abdominal pain, nondistended, positive soft, bowel sounds, no rebound, no ascites, no appreciable mass Extremities: No significant cyanosis,  clubbing, or edema bilateral lower extremities Skin: Negative rashes, lesions, ulcers Psychiatric:  Negative depression, negative anxiety, negative fatigue, negative mania  Central nervous system:  Cranial nerves II through XII intact, tongue/uvula midline, all extremities muscle strength 5/5, sensation intact throughout, negative dysarthria, negative expressive aphasia, negative receptive aphasia.  .     Data Reviewed: Care during the described time interval was provided by me .  I have reviewed this patient's available data, including medical history, events of note, physical examination, and all test results as part of my evaluation.  CBC: Recent Labs  Lab 11/19/19 1121 11/19/19 2342 11/20/19 0555 11/21/19 0237 11/22/19 0334  WBC 11.9* 11.1* 9.3 9.0 8.0  NEUTROABS  --   --   --  4.5 2.9  HGB 13.2 13.0 11.4* 10.8* 10.7*  HCT 38.3 38.5 33.6* 30.9* 31.7*  MCV 87.2 88.5 88.7 87.8 88.1  PLT 244 212 211 210 222   Basic Metabolic Panel: Recent Labs  Lab 11/19/19 1121 11/19/19 1121 11/19/19 2342 11/20/19 0555 11/21/19 0237 11/21/19 1755 11/22/19 0334  NA 129*  --   --  133* 131* 135 138  K 3.5  --   --  3.6 2.7* 3.9 3.9  CL 94*  --   --  100 102 105 106  CO2 25  --   --  24 21* 22 24  GLUCOSE 151*  --   --  107* 108* 158* 102*  BUN 11  --   --  11 7* 6* <5*  CREATININE 0.79   < > 0.69 0.64 0.54 0.68 0.72  CALCIUM 8.7*  --   --  8.4* 8.4* 8.7* 8.6*  MG  --   --   --   --  1.7 1.7 1.7  PHOS  --   --   --   --  2.4*  --  2.8   < > = values in this interval not displayed.   GFR: Estimated Creatinine Clearance: 51 mL/min (by C-G formula based on SCr of 0.72 mg/dL). Liver Function Tests: Recent Labs  Lab 11/19/19 1121 11/21/19 0237 11/22/19 0334  AST 24 23 23   ALT 18 14 16   ALKPHOS 59 44 62  BILITOT 1.0 0.5 0.6  PROT 6.3* 5.0* 5.4*  ALBUMIN 3.7 2.7* 2.9*   Recent Labs  Lab 11/19/19 1121  LIPASE 35   No results for input(s): AMMONIA in the last 168  hours. Coagulation Profile: No results for input(s): INR, PROTIME in the last 168 hours. Cardiac Enzymes: Recent Labs  Lab 11/20/19 0555  CKTOTAL 57   BNP (last 3 results) No results for input(s): PROBNP in the last 8760 hours. HbA1C: No results for input(s): HGBA1C in the last 72 hours. CBG: No results for input(s): GLUCAP in the last 168 hours. Lipid Profile: No results for input(s): CHOL, HDL, LDLCALC, TRIG, CHOLHDL, LDLDIRECT in the last 72 hours. Thyroid Function Tests: Recent Labs    11/22/19 1433  TSH 1.666   Anemia Panel: No results for input(s): VITAMINB12, FOLATE, FERRITIN, TIBC, IRON, RETICCTPCT in the last 72 hours. Sepsis Labs: Recent Labs  Lab 11/19/19 1121 11/19/19 2005  LATICACIDVEN 1.2 1.1    Recent Results (from the past 240 hour(s))  Urine culture     Status: None   Collection Time: 11/19/19 12:20 PM   Specimen: Urine, Random  Result Value Ref Range Status   Specimen Description URINE, RANDOM  Final   Special Requests NONE  Final   Culture   Final    NO GROWTH Performed at [facility], [address], [place], [place] [address]    Report Status 11/20/2019 FINAL  Final  SARS Coronavirus 2 by RT PCR (hospital order, performed in [facility] hospital lab) Nasopharyngeal Nasopharyngeal Swab     Status: None   Collection Time: 11/19/19 12:20 PM   Specimen: Nasopharyngeal Swab  Result Value Ref Range Status   SARS Coronavirus 2 NEGATIVE NEGATIVE Final    Comment: (NOTE) SARS-CoV-2 target nucleic acids are NOT DETECTED.  The SARS-CoV-2 RNA is generally detectable in upper and lower respiratory specimens during the acute phase of infection. The lowest concentration of SARS-CoV-2 viral copies this assay can detect is  250 copies / mL. A negative result does not preclude SARS-CoV-2 infection and should not be used as the sole basis for treatment or other patient management decisions.  A negative result may occur with improper specimen  collection / handling, submission of specimen other than nasopharyngeal swab, presence of viral mutation(s) within the areas targeted by this assay, and inadequate number of viral copies (<250 copies / mL). A negative result must be combined with clinical observations, patient history, and epidemiological information.  Fact Sheet for Patients:   StrictlyIdeas.no  Fact Sheet for Healthcare Providers: BankingDealers.co.za  This test is not yet approved or  cleared by the [place] FDA and has been authorized for detection and/or diagnosis of SARS-CoV-2 by FDA under an Emergency Use Authorization (EUA).  This EUA will remain in effect (meaning this test can be used) for the duration of the COVID-19 declaration under Section 564(b)(1) of the Act, 21 U.S.C. section 360bbb-3(b)(1), unless the authorization is terminated or revoked sooner.  Performed at [facility], [place] [address] [place], [place] [address]   Culture, blood (routine x 2)     Status: None (Preliminary result)   Collection Time: 11/19/19  8:00 PM   Specimen: BLOOD LEFT HAND  Result Value Ref Range Status   Specimen Description BLOOD LEFT HAND  Final   Special Requests   Final    BOTTLES DRAWN AEROBIC AND ANAEROBIC Blood Culture adequate volume   Culture   Final    NO GROWTH 3 DAYS Performed at [facility], [place] [address], [place], [place] [address]    Report Status PENDING  Incomplete  Culture, blood (routine x 2)     Status: None (Preliminary result)   Collection Time: 11/19/19  9:00 PM   Specimen: BLOOD RIGHT HAND  Result Value Ref Range Status   Specimen Description BLOOD RIGHT HAND  Final   Special Requests   Final    BOTTLES DRAWN AEROBIC AND ANAEROBIC Blood Culture adequate volume   Culture   Final    NO GROWTH 3 DAYS Performed at [facility], [place] [address], [place], [place] [address]    Report Status PENDING  Incomplete   Gastrointestinal Panel by PCR , Stool     Status: Abnormal   Collection Time: 11/20/19  3:56 PM   Specimen: STOOL  Result Value Ref Range Status   Campylobacter species DETECTED (A) NOT DETECTED Final    Comment: RESULT CALLED TO, READ BACK BY AND VERIFIED WITH: MELON BONADO @2011  11/20/19 MJU    Plesimonas shigelloides NOT DETECTED NOT DETECTED Final   Salmonella species NOT DETECTED NOT DETECTED Final   Yersinia enterocolitica NOT DETECTED NOT DETECTED Final   Vibrio species NOT DETECTED NOT DETECTED Final   Vibrio cholerae NOT DETECTED NOT DETECTED Final   Enteroaggregative E coli (EAEC) NOT DETECTED NOT DETECTED Final   Enteropathogenic E coli (EPEC) NOT DETECTED NOT DETECTED Final   Enterotoxigenic E coli (ETEC) NOT DETECTED NOT DETECTED Final   Shiga like toxin producing E coli (STEC) NOT DETECTED NOT DETECTED Final   Shigella/Enteroinvasive E coli (EIEC) NOT DETECTED NOT DETECTED Final   Cryptosporidium NOT DETECTED NOT DETECTED Final   Cyclospora cayetanensis NOT DETECTED NOT DETECTED Final   Entamoeba histolytica NOT DETECTED NOT DETECTED Final   Giardia lamblia NOT DETECTED NOT DETECTED Final   Adenovirus F40/41 NOT DETECTED NOT DETECTED Final   Astrovirus NOT DETECTED NOT DETECTED Final   Norovirus GI/GII NOT DETECTED NOT DETECTED Final  Rotavirus A NOT DETECTED NOT DETECTED Final   Sapovirus (I, II, IV, and V) NOT DETECTED NOT DETECTED Final    Comment: Performed at [facility], [place], [place] [address]  C Difficile Quick Screen w PCR reflex     Status: Abnormal   Collection Time: 11/20/19  3:56 PM   Specimen: STOOL  Result Value Ref Range Status   C Diff antigen POSITIVE (A) NEGATIVE Final   C Diff toxin POSITIVE (A) NEGATIVE Final   C Diff interpretation Toxin producing C. difficile detected.  Final    Comment: CRITICAL RESULT CALLED TO, READ BACK BY AND VERIFIED WITH: RN ALA ABU AT 1724 11/20/19 BY MM Performed at [facility], [address], [place], [place] [address]          Radiology Studies: No results found.      Scheduled Meds: . amLODipine  2.5 mg Oral Daily  . losartan  25 mg Oral Daily  . magnesium oxide  400 mg Oral Daily  . mometasone-formoterol  2 puff Inhalation BID  . omega-3 acid ethyl esters  2 g Oral BID  . potassium chloride  40 mEq Oral Once  . propranolol  10 mg Oral BID  . vancomycin  125 mg Oral QID   Followed by  . [START ON 12/04/2019] vancomycin  125 mg Oral BID   Followed by  . [START ON 12/12/2019] vancomycin  125 mg Oral Daily   Followed by  . [START ON 12/19/2019] vancomycin  125 mg Oral QODAY   Followed by  . [START ON 12/27/2019] vancomycin  125 mg Oral Q3 days   Continuous Infusions: . sodium chloride    . heparin 900 Units/hr (11/22/19 1500)     LOS: 2 days    Time spent:40 min    Kalii Chesmore, Geraldo Docker, MD [facility] Pager [phone]  If 7PM-7AM, please contact night-coverage [email or web address] Password [facility] 11/22/2019, 4:36 PM

## 2019-11-22 NOTE — Progress Notes (Signed)
°  Echocardiogram 2D Echocardiogram has been attempted.Patient receiving personal care. Will reattempt at later date.  [name] 11/22/2019, 3:40 PM

## 2019-11-22 NOTE — Progress Notes (Signed)
Pt is in Afib and had a 2 second pause not symptomatic HR ranging from 70-78. MD notified

## 2019-11-22 NOTE — Progress Notes (Signed)
ANTICOAGULATION CONSULT NOTE - Initial Consult  Pharmacy Consult for Heparin Indication: atrial fibrillation  Allergies  Allergen Reactions  . Ace Inhibitors     cough  . Amoxicillin Itching    REACTION: unknown? pain in right kidney  . Benicar Hct [Olmesartan Medoxomil-Hctz]     Extreme weakness  . Budesonide-Formoterol Fumarate     Causes tremors and numbness  . Chlorzoxazone [Chlorzoxazone]   . Citalopram Hydrobromide     REACTION: hives  . Citalopram Hydrobromide   . Clonidine Derivatives   . Cymbalta [Duloxetine Hcl]   . Droperidol     REACTION: hives  . Flexeril [Cyclobenzaprine Hcl]   . Fluconazole Nausea And Vomiting and Other (See Comments)    fatigue  . Gabapentin Itching  . Ketorolac Tromethamine     REACTION: hives  . Ketorolac Tromethamine   . Metoclopramide Hcl   . Mometasone Furo-Formoterol Fum     Causes sore throat, blurred vision and unable to sleep--started on med 09-17-10  . Morphine     REACTION: hives and itching  . Olmesartan Medoxomil     REACTION: fatique  . Breo Ellipta [Fluticasone Furoate-Vilanterol] Itching    Pt reports itching with [place] is related to being lactose intolerant.     Patient Measurements: Height: 5' 3"  (160 cm) Weight: 68 kg (150 lb) IBW/kg (Calculated) : 52.4 HEPARIN DW (KG): 66.3  Vital Signs: Temp: 98.3 Reid (36.8 C) (07/12 1321) Temp Source: Oral (07/12 1321) BP: 117/Carla (07/12 1321) Pulse Rate: 64 (07/12 1321)  Labs: Recent Labs    11/20/19 0555 11/20/19 0555 11/21/19 0237 11/21/19 1755 11/22/19 0334  HGB 11.4*   < > 10.8*  --  10.7*  HCT 33.6*  --  30.9*  --  31.7*  PLT 211  --  210  --  222  CREATININE 0.64   < > 0.54 0.68 0.72  CKTOTAL 57  --   --   --   --   TROPONINIHS 17  --   --   --   --    < > = values in this interval not displayed.    Estimated Creatinine Clearance: 51 mL/min (by C-G formula based on SCr of 0.72 mg/dL).   Medical History: Past Medical History:  Diagnosis Date  .  ALLERGIC RHINITIS   . Anxiety   . Asthma   . Blood type O+   . Cervical strain, acute   . Cirrhosis ([place])   . Colon polyp 2010   adenoma  . Diverticulosis of colon   . Dizziness   . Esophageal varices ([place])   . Fibromyalgia   . GERD (gastroesophageal reflux disease)   . History of nephrolithiasis   . Hypercholesterolemia    borderline  . Hypertension   . IBS (irritable bowel syndrome)   . Osteoarthritis   . Personal history of allergy to unspecified medicinal agent   . Postconcussion syndrome   . Vitamin D deficiency     Medications:  Scheduled:  . amLODipine  2.5 mg Oral Daily  . losartan  25 mg Oral Daily  . mometasone-formoterol  2 puff Inhalation BID  . omega-3 acid ethyl esters  2 g Oral BID  . propranolol  10 mg Oral BID  . vancomycin  125 mg Oral QID   Followed by  . [START ON 12/04/2019] vancomycin  125 mg Oral BID   Followed by  . [START ON 12/12/2019] vancomycin  125 mg Oral Daily   Followed by  . [START ON  12/19/2019] vancomycin  125 mg Oral QODAY   Followed by  . [START ON 12/27/2019] vancomycin  125 mg Oral Q3 days    Assessment: 79 yo Reid admitted with C. diff colitis who developed afib in the setting of acute illness.  PMH significant for NASH liver cirrhosis, PLTC >200, LFTs wnl.  Pt was on Lovenox for VTE ppx, last dose given 7/11 pm.  Goal of Therapy:  Heparin level 0.3-0.7 units/ml Monitor platelets by anticoagulation protocol: Yes   Plan:  Start heparin infusion at 900 units/hr Check anti-Xa level in 8 hours and daily while on heparin Continue to monitor H&H and platelets  [employer], Pharm.D., BCPS Clinical Pharmacist Clinical phone for 11/22/2019 from 8:30-4:00 is [phone].  **Pharmacist phone directory can be found on [email or web address] listed under [place].  11/22/2019 2:10 PM

## 2019-11-22 NOTE — Progress Notes (Signed)
ANTICOAGULATION CONSULT NOTE - Follow Up Consult  Pharmacy Consult for heparin Indication: atrial fibrillation  Labs: Recent Labs    11/20/19 0555 11/20/19 0555 11/21/19 0237 11/21/19 1755 11/22/19 0334 11/22/19 1433 11/22/19 1624 11/22/19 2227  HGB 11.4*   < > 10.8*  --  10.7*  --   --   --   HCT 33.6*  --  30.9*  --  31.7*  --   --   --   PLT 211  --  210  --  222  --   --   --   HEPARINUNFRC  --   --   --   --   --   --   --  0.12*  CREATININE 0.64   < > 0.54 0.68 0.72  --   --   --   CKTOTAL 57  --   --   --   --   --   --   --   TROPONINIHS 17  --   --   --   --  8 7  --    < > = values in this interval not displayed.    Assessment: 79yo female subtherapeutic on heparin with initial dosing for new Afib.  Goal of Therapy:  Heparin level 0.3-0.7 units/ml   Plan:  Will give small heparin bolus of 1000 units and increase heparin gtt by 3 units/kg/hr to 1100 units/hr and check level in 8 hours.    Wynona Neat, PharmD, BCPS  11/22/2019,11:43 PM

## 2019-11-23 ENCOUNTER — Other Ambulatory Visit: Payer: Self-pay

## 2019-11-23 ENCOUNTER — Inpatient Hospital Stay (HOSPITAL_COMMUNITY): Payer: PPO

## 2019-11-23 DIAGNOSIS — I361 Nonrheumatic tricuspid (valve) insufficiency: Secondary | ICD-10-CM

## 2019-11-23 LAB — CBC WITH DIFFERENTIAL/PLATELET
Abs Immature Granulocytes: 0.05 10*3/uL (ref 0.00–0.07)
Basophils Absolute: 0.1 10*3/uL (ref 0.0–0.1)
Basophils Relative: 1 %
Eosinophils Absolute: 0.2 10*3/uL (ref 0.0–0.5)
Eosinophils Relative: 3 %
HCT: 36 % (ref 36.0–46.0)
Hemoglobin: 12 g/dL (ref 12.0–15.0)
Immature Granulocytes: 1 %
Lymphocytes Relative: 41 %
Lymphs Abs: 3.3 10*3/uL (ref 0.7–4.0)
MCH: 29.8 pg (ref 26.0–34.0)
MCHC: 33.3 g/dL (ref 30.0–36.0)
MCV: 89.3 fL (ref 80.0–100.0)
Monocytes Absolute: 0.9 10*3/uL (ref 0.1–1.0)
Monocytes Relative: 11 %
Neutro Abs: 3.5 10*3/uL (ref 1.7–7.7)
Neutrophils Relative %: 43 %
Platelets: 315 10*3/uL (ref 150–400)
RBC: 4.03 MIL/uL (ref 3.87–5.11)
RDW: 13.1 % (ref 11.5–15.5)
WBC: 8 10*3/uL (ref 4.0–10.5)
nRBC: 0 % (ref 0.0–0.2)

## 2019-11-23 LAB — COMPREHENSIVE METABOLIC PANEL
ALT: 18 U/L (ref 0–44)
AST: 26 U/L (ref 15–41)
Albumin: 3.3 g/dL — ABNORMAL LOW (ref 3.5–5.0)
Alkaline Phosphatase: 56 U/L (ref 38–126)
Anion gap: 11 (ref 5–15)
BUN: <5 mg/dL — ABNORMAL LOW (ref 8–23)
CO2: 23 mmol/L (ref 22–32)
Calcium: 9.3 mg/dL (ref 8.9–10.3)
Chloride: 104 mmol/L (ref 98–111)
Creatinine, Ser: 0.61 mg/dL (ref 0.44–1.00)
GFR calc Af Amer: >60 mL/min (ref >60)
GFR calc non Af Amer: >60 mL/min (ref >60)
Glucose, Bld: 112 mg/dL — ABNORMAL HIGH (ref 70–99)
Potassium: 3.6 mmol/L (ref 3.5–5.1)
Sodium: 138 mmol/L (ref 135–145)
Total Bilirubin: 0.9 mg/dL (ref 0.3–1.2)
Total Protein: 6.2 g/dL — ABNORMAL LOW (ref 6.5–8.1)

## 2019-11-23 LAB — ECHOCARDIOGRAM COMPLETE
Height: 63 in
Weight: 2400 oz

## 2019-11-23 LAB — PHOSPHORUS: Phosphorus: 3.3 mg/dL (ref 2.5–4.6)

## 2019-11-23 LAB — HEPARIN LEVEL (UNFRACTIONATED)
Heparin Unfractionated: 0.33 IU/mL (ref 0.30–0.70)
Heparin Unfractionated: 0.33 IU/mL (ref 0.30–0.70)

## 2019-11-23 LAB — MAGNESIUM: Magnesium: 1.6 mg/dL — ABNORMAL LOW (ref 1.7–2.4)

## 2019-11-23 MED ORDER — MAGNESIUM SULFATE 50 % IJ SOLN
3.0000 g | Freq: Once | INTRAVENOUS | Status: AC
  Administered 2019-11-23: 3 g via INTRAVENOUS
  Filled 2019-11-23: qty 6

## 2019-11-23 MED ORDER — SACCHAROMYCES BOULARDII 250 MG PO CAPS
250.0000 mg | ORAL_CAPSULE | Freq: Two times a day (BID) | ORAL | Status: DC
  Administered 2019-11-23 – 2019-11-27 (×9): 250 mg via ORAL
  Filled 2019-11-23 (×9): qty 1

## 2019-11-23 MED ORDER — POTASSIUM CHLORIDE CRYS ER 20 MEQ PO TBCR
60.0000 meq | EXTENDED_RELEASE_TABLET | Freq: Once | ORAL | Status: AC
  Administered 2019-11-23: 60 meq via ORAL
  Filled 2019-11-23: qty 3

## 2019-11-23 NOTE — Progress Notes (Signed)
PROGRESS NOTE    [name]  HBZ:[id number] DOB: Apr 16, 1941 DOA: 11/19/2019 PCP: Lauree Chandler, NP     Brief Narrative:  79 y.o. WF lives in [facility] PMHx Fibromyalgia, cirrhosis of the liver secondary to [place], esophageal varices, HTN, HLD,, asthma, irritable bowel syndrome, presents to the ER because of ongoing weakness for the last 1 week where patient was feeling intensely fatigued.  Patient over the last 1 week he has been having increased frequency of urination and diarrhea.  Diarrhea is watery.  Denies any chest pain or shortness of breath.  Denies any loss of consciousness.  Given the worsening symptoms patient presents to the ER.  ED Course: In the ER patient is found to be febrile with a temperature 102 F and lab work show sodium 129 WBC 11.9 lactic acid was normal EKG shows normal sinus rhythm.  Chest x-ray was unremarkable UA is going which is concerning for UTI.  Given the symptoms patient had blood cultures drawn started on fluids empiric antibiotics admitted for further management.  Subjective: 7/13 afebrile overnight, A/O x4, negative nausea.  Negative vomiting.  Negative chest pain.  Negative S OB.  Continues to have diarrhea with the consistency of gravy.  Diarrhea has not improved at all.  This is day 3 of antibiotics.    Assessment & Plan: Covid vaccination; positive vaccination   Active Problems:   Essential hypertension   Esophageal varices without bleeding (HCC)   Fever   Acute lower UTI   Diarrhea   C. difficile colitis   Other cirrhosis of liver ([place])   New onset atrial fibrillation (HCC)  C. difficile colitis  -Stool test consistent with C. difficile colitis -We will begin vancomycin taper per protocol -7/13 counseled patient that if diarrhea does not begin to improve in the next couple days will have to consider adding additional agent.  Will consult GI at that time prior to adding agent. -7/13 probiotic BID (patient request) will not cause  any harm  Hyponatremia -Most likely secondary to dehydration from diarrhea. -Continue normal saline 1m/hr Recent Labs  Lab 11/20/19 0555 11/21/19 0237 11/21/19 1755 11/22/19 0334 11/23/19 0649  NA 133* 131* 135 138 138   Hypokalemia -Potassium> 4 -K-Dur 60 mEq  Hypomagnesmia -Magnesium> 2 -Magnesium IV 3 g -NOTE; DO NOT give this patient magnesium oxide as it can cause/exacerbate diarrhea  Ascites? -[place]dom[place] negative for ascites see results below  Liver cirrhosis -Amlodipine 2.5 mg daily -Hydralazine PRN -Propranolol 10 mg BID -7/12 nutrition consult; patient with liver cirrhosis, essential HTN, requests a nutrition consult to help her with appropriate meal planning  Essential HTN  -See liver cirrhosis  New onset atrial fibrillation -Page that there had been a couple episodes of 2-second pauses, asymptomatic -Currently rate controlled -7/12 EKG; NSR with nonspecific ST-T wave changes -Heparin drip per pharmacy -7/13 echocardiogram completed; reading pending -7/12 TSH normal  Asthma -Currently not an issue    DVT prophylaxis: Heparin drip  Code Status: DNR Family Communication: 7/13 cousin at bedside for discussion of plan of care Status is: Inpatient    Dispo: The patient is from: SNF              Anticipated d/c is to: SNF              Anticipated d/c date is: 7/17              Patient currently unstable      Consultants:    Procedures/Significant Events:  7/9 CXR; negative cardiopulmonary disease 7/10 [place] ascites (abdomen); negative for ascites   I have personally reviewed and interpreted all radiology studies and my findings are as above.  VENTILATOR SETTINGS:    Cultures 7/10 positive C. difficile antigen, positive C. difficile toxin   Antimicrobials: Anti-infectives (From admission, onward)   Start     Ordered Stop   12/27/19 1000  vancomycin (VANCOCIN) 50 mg/mL oral solution 125 mg     Discontinue    "Followed by" Linked  Group Details   11/20/19 1805 01/05/20 0959   12/19/19 1000  vancomycin (VANCOCIN) 50 mg/mL oral solution 125 mg     Discontinue    "Followed by" Linked Group Details   11/20/19 1805 12/27/19 0959   12/12/19 1000  vancomycin (VANCOCIN) 50 mg/mL oral solution 125 mg     Discontinue    "Followed by" Linked Group Details   11/20/19 1805 12/19/19 0959   12/04/19 2200  vancomycin (VANCOCIN) 50 mg/mL oral solution 125 mg     Discontinue    "Followed by" Linked Group Details   11/20/19 1805 12/11/19 2159   11/20/19 2000  vancomycin (VANCOCIN) 50 mg/mL oral solution 125 mg     Discontinue    "Followed by" Linked Group Details   11/20/19 1805 12/04/19 2159   11/20/19 1000  vancomycin (VANCOREADY) IVPB 500 mg/100 mL  Status:  Discontinued        11/19/19 2029 11/20/19 1805   11/19/19 2030  vancomycin (VANCOREADY) IVPB 1250 mg/250 mL        11/19/19 2028 11/19/19 2310   11/19/19 1545  cefTRIAXone (ROCEPHIN) 1 g in sodium chloride 0.9 % 100 mL IVPB        11/19/19 1543 11/19/19 1956   11/19/19 0000  cephALEXin (KEFLEX) 500 MG capsule     Discontinue     11/19/19 1754 11/24/19 2359      Devices   LINES / TUBES:      Continuous Infusions: . sodium chloride 50 mL/hr at 11/22/19 1806  . heparin 1,150 Units/hr (11/23/19 0921)     Objective: Vitals:   11/23/19 0058 11/23/19 0558 11/23/19 0823 11/23/19 1203  BP: (!) 128/51 (!) 126/56  (!) 121/59  Pulse: 61 72 71 65  Resp: 16 18 16 18   Temp: 98.4 F (36.9 C) 98.7 F (37.1 C)  98 F (36.7 C)  TempSrc: Oral Oral  Oral  SpO2: 99% 100% 96% 97%  Weight:      Height:        Intake/Output Summary (Last 24 hours) at 11/23/2019 1349 Last data filed at 11/23/2019 1000 Gross per 24 hour  Intake 907.41 ml  Output --  Net 907.41 ml   Filed Weights   11/19/19 1106  Weight: 68 kg   Physical Exam:  General: A/O x4 No acute respiratory distress Eyes: negative scleral hemorrhage, negative anisocoria, negative icterus ENT: Negative Runny  nose, negative gingival bleeding, Neck:  Negative scars, masses, torticollis, lymphadenopathy, JVD Lungs: Clear to auscultation bilaterally without wheezes or crackles Cardiovascular: Regular rate and rhythm without murmur gallop or rub normal S1 and S2 Abdomen: negative abdominal pain, nondistended, positive soft, bowel sounds, no rebound, no ascites, no appreciable mass Extremities: No significant cyanosis, clubbing, or edema bilateral lower extremities Skin: Negative rashes, lesions, ulcers Psychiatric:  Negative depression, negative anxiety, negative fatigue, negative mania  Central nervous system:  Cranial nerves II through XII intact, tongue/uvula midline, all extremities muscle strength 5/5, sensation intact throughout, negative dysarthria, negative expressive aphasia,  negative receptive aphasia. .     Data Reviewed: Care during the described time interval was provided by me .  I have reviewed this patient's available data, including medical history, events of note, physical examination, and all test results as part of my evaluation.  CBC: Recent Labs  Lab 11/19/19 2342 11/20/19 0555 11/21/19 0237 11/22/19 0334 11/23/19 0649  WBC 11.1* 9.3 9.0 8.0 8.0  NEUTROABS  --   --  4.5 2.9 3.5  HGB 13.0 11.4* 10.8* 10.7* 12.0  HCT 38.5 33.6* 30.9* 31.7* 36.0  MCV 88.5 88.7 87.8 88.1 89.3  PLT 212 211 210 222 416   Basic Metabolic Panel: Recent Labs  Lab 11/20/19 0555 11/21/19 0237 11/21/19 1755 11/22/19 0334 11/23/19 0649  NA 133* 131* 135 138 138  K 3.6 2.7* 3.9 3.9 3.6  CL 100 102 105 106 104  CO2 24 21* 22 24 23   GLUCOSE 107* 108* 158* 102* 112*  BUN 11 7* 6* <5* <5*  CREATININE 0.64 0.54 0.68 0.72 0.61  CALCIUM 8.4* 8.4* 8.7* 8.6* 9.3  MG  --  1.7 1.7 1.7 1.6*  PHOS  --  2.4*  --  2.8 3.3   GFR: Estimated Creatinine Clearance: 51 mL/min (by C-G formula based on SCr of 0.61 mg/dL). Liver Function Tests: Recent Labs  Lab 11/19/19 1121 11/21/19 0237 11/22/19 0334  11/23/19 0649  AST 24 23 23 26   ALT 18 14 16 18   ALKPHOS 59 44 62 56  BILITOT 1.0 0.5 0.6 0.9  PROT 6.3* 5.0* 5.4* 6.2*  ALBUMIN 3.7 2.7* 2.9* 3.3*   Recent Labs  Lab 11/19/19 1121  LIPASE 35   No results for input(s): AMMONIA in the last 168 hours. Coagulation Profile: No results for input(s): INR, PROTIME in the last 168 hours. Cardiac Enzymes: Recent Labs  Lab 11/20/19 0555  CKTOTAL 57   BNP (last 3 results) No results for input(s): PROBNP in the last 8760 hours. HbA1C: No results for input(s): HGBA1C in the last 72 hours. CBG: No results for input(s): GLUCAP in the last 168 hours. Lipid Profile: No results for input(s): CHOL, HDL, LDLCALC, TRIG, CHOLHDL, LDLDIRECT in the last 72 hours. Thyroid Function Tests: Recent Labs    11/22/19 1433  TSH 1.666   Anemia Panel: No results for input(s): VITAMINB12, FOLATE, FERRITIN, TIBC, IRON, RETICCTPCT in the last 72 hours. Sepsis Labs: Recent Labs  Lab 11/19/19 1121 11/19/19 2005  LATICACIDVEN 1.2 1.1    Recent Results (from the past 240 hour(s))  Urine culture     Status: None   Collection Time: 11/19/19 12:20 PM   Specimen: Urine, Random  Result Value Ref Range Status   Specimen Description URINE, RANDOM  Final   Special Requests NONE  Final   Culture   Final    NO GROWTH Performed at [facility], [address], [place], [place] [address]    Report Status 11/20/2019 FINAL  Final  SARS Coronavirus 2 by RT PCR (hospital order, performed in [facility] hospital lab) Nasopharyngeal Nasopharyngeal Swab     Status: None   Collection Time: 11/19/19 12:20 PM   Specimen: Nasopharyngeal Swab  Result Value Ref Range Status   SARS Coronavirus 2 NEGATIVE NEGATIVE Final    Comment: (NOTE) SARS-CoV-2 target nucleic acids are NOT DETECTED.  The SARS-CoV-2 RNA is generally detectable in upper and lower respiratory specimens during the acute phase of infection. The lowest concentration of SARS-CoV-2 viral copies  this assay can detect is 250  copies / mL. A negative result does not preclude SARS-CoV-2 infection and should not be used as the sole basis for treatment or other patient management decisions.  A negative result may occur with improper specimen collection / handling, submission of specimen other than nasopharyngeal swab, presence of viral mutation(s) within the areas targeted by this assay, and inadequate number of viral copies (<250 copies / mL). A negative result must be combined with clinical observations, patient history, and epidemiological information.  Fact Sheet for Patients:   StrictlyIdeas.no  Fact Sheet for Healthcare Providers: BankingDealers.co.za  This test is not yet approved or  cleared by the [place] FDA and has been authorized for detection and/or diagnosis of SARS-CoV-2 by FDA under an Emergency Use Authorization (EUA).  This EUA will remain in effect (meaning this test can be used) for the duration of the COVID-19 declaration under Section 564(b)(1) of the Act, 21 U.S.C. section 360bbb-3(b)(1), unless the authorization is terminated or revoked sooner.  Performed at [facility], [place] [address] [place], [place] [address]   Culture, blood (routine x 2)     Status: None (Preliminary result)   Collection Time: 11/19/19  8:00 PM   Specimen: BLOOD LEFT HAND  Result Value Ref Range Status   Specimen Description BLOOD LEFT HAND  Final   Special Requests   Final    BOTTLES DRAWN AEROBIC AND ANAEROBIC Blood Culture adequate volume   Culture   Final    NO GROWTH 4 DAYS Performed at [facility], [place] [address], [place], [place] [address]    Report Status PENDING  Incomplete  Culture, blood (routine x 2)     Status: None (Preliminary result)   Collection Time: 11/19/19  9:00 PM   Specimen: BLOOD RIGHT HAND  Result Value Ref Range Status   Specimen Description BLOOD RIGHT HAND  Final   Special Requests    Final    BOTTLES DRAWN AEROBIC AND ANAEROBIC Blood Culture adequate volume   Culture   Final    NO GROWTH 4 DAYS Performed at [facility], [place] [address] [place], [place] [address]    Report Status PENDING  Incomplete  Gastrointestinal Panel by PCR , Stool     Status: Abnormal   Collection Time: 11/20/19  3:56 PM   Specimen: STOOL  Result Value Ref Range Status   Campylobacter species DETECTED (A) NOT DETECTED Final    Comment: RESULT CALLED TO, READ BACK BY AND VERIFIED WITH: MELON BONADO @2011  11/20/19 MJU    Plesimonas shigelloides NOT DETECTED NOT DETECTED Final   Salmonella species NOT DETECTED NOT DETECTED Final   Yersinia enterocolitica NOT DETECTED NOT DETECTED Final   Vibrio species NOT DETECTED NOT DETECTED Final   Vibrio cholerae NOT DETECTED NOT DETECTED Final   Enteroaggregative E coli (EAEC) NOT DETECTED NOT DETECTED Final   Enteropathogenic E coli (EPEC) NOT DETECTED NOT DETECTED Final   Enterotoxigenic E coli (ETEC) NOT DETECTED NOT DETECTED Final   Shiga like toxin producing E coli (STEC) NOT DETECTED NOT DETECTED Final   Shigella/Enteroinvasive E coli (EIEC) NOT DETECTED NOT DETECTED Final   Cryptosporidium NOT DETECTED NOT DETECTED Final   Cyclospora cayetanensis NOT DETECTED NOT DETECTED Final   Entamoeba histolytica NOT DETECTED NOT DETECTED Final   Giardia lamblia NOT DETECTED NOT DETECTED Final   Adenovirus F40/41 NOT DETECTED NOT DETECTED Final   Astrovirus NOT DETECTED NOT DETECTED Final   Norovirus GI/GII NOT DETECTED NOT DETECTED Final  Rotavirus A NOT DETECTED NOT DETECTED Final   Sapovirus (I, II, IV, and V) NOT DETECTED NOT DETECTED Final    Comment: Performed at [facility], [place], [place] [address]  C Difficile Quick Screen w PCR reflex     Status: Abnormal   Collection Time: 11/20/19  3:56 PM   Specimen: STOOL  Result Value Ref Range Status   C Diff antigen POSITIVE (A) NEGATIVE Final   C Diff toxin  POSITIVE (A) NEGATIVE Final   C Diff interpretation Toxin producing C. difficile detected.  Final    Comment: CRITICAL RESULT CALLED TO, READ BACK BY AND VERIFIED WITH: RN ALA ABU AT 1724 11/20/19 BY MM Performed at [facility], [address], [place], [place] [address]          Radiology Studies: No results found.      Scheduled Meds: . amLODipine  2.5 mg Oral Daily  . losartan  25 mg Oral Daily  . magnesium oxide  400 mg Oral Daily  . mometasone-formoterol  2 puff Inhalation BID  . omega-3 acid ethyl esters  2 g Oral BID  . propranolol  10 mg Oral BID  . vancomycin  125 mg Oral QID   Followed by  . [START ON 12/04/2019] vancomycin  125 mg Oral BID   Followed by  . [START ON 12/12/2019] vancomycin  125 mg Oral Daily   Followed by  . [START ON 12/19/2019] vancomycin  125 mg Oral QODAY   Followed by  . [START ON 12/27/2019] vancomycin  125 mg Oral Q3 days   Continuous Infusions: . sodium chloride 50 mL/hr at 11/22/19 1806  . heparin 1,150 Units/hr (11/23/19 0921)     LOS: 3 days    Time spent:40 min    Deakon Frix, Geraldo Docker, MD [facility] Pager [phone]  If 7PM-7AM, please contact night-coverage [email or web address] Password [facility] 11/23/2019, 1:49 PM

## 2019-11-23 NOTE — Progress Notes (Signed)
  Echocardiogram 2D Echocardiogram has been performed.  [name] 11/23/2019, 1:57 PM

## 2019-11-23 NOTE — Plan of Care (Signed)
Nutrition Education Note  RD consulted for nutrition education.  Pt with liver cirrhosis and HTN. RD provided "Low Sodium Nutrition Therapy" handout from the Academy of Nutrition and Dietetics. Reviewed patient's dietary recall. Provided examples on ways to decrease sodium intake in diet. Discouraged intake of processed foods and use of salt shaker. Encouraged fresh fruits and vegetables as well as whole grain sources of carbohydrates to maximize fiber intake. Discussed increased need for lean protein within the diet. Teach back method used.  Expect good compliance.  Current diet order is heart diet, patient is consuming approximately 100% of meals at this time. Labs and medications reviewed. No further nutrition interventions warranted at this time. RD contact information provided. If additional nutrition issues arise, please re-consult RD.   Corrin Parker, MS, RD, LDN RD pager number/after hours weekend pager number on Amion.

## 2019-11-23 NOTE — Progress Notes (Addendum)
[name] for Heparin Indication: atrial fibrillation  Allergies  Allergen Reactions  . Ace Inhibitors     cough  . Amoxicillin Itching    REACTION: unknown? pain in right kidney  . Benicar Hct [Olmesartan Medoxomil-Hctz]     Extreme weakness  . Budesonide-Formoterol Fumarate     Causes tremors and numbness  . Chlorzoxazone [Chlorzoxazone]   . Citalopram Hydrobromide     REACTION: hives  . Citalopram Hydrobromide   . Clonidine Derivatives   . Cymbalta [Duloxetine Hcl]   . Droperidol     REACTION: hives  . Flexeril [Cyclobenzaprine Hcl]   . Fluconazole Nausea And Vomiting and Other (See Comments)    fatigue  . Gabapentin Itching  . Ketorolac Tromethamine     REACTION: hives  . Ketorolac Tromethamine   . Metoclopramide Hcl   . Mometasone Furo-Formoterol Fum     Causes sore throat, blurred vision and unable to sleep--started on med 09-17-10  . Morphine     REACTION: hives and itching  . Olmesartan Medoxomil     REACTION: fatique  . Breo Ellipta [Fluticasone Furoate-Vilanterol] Itching    Pt reports itching with Memory Dance is related to being lactose intolerant.     Patient Measurements: Height: 5' 3"  (160 cm) Weight: 68 kg (150 lb) IBW/kg (Calculated) : 52.4 HEPARIN DW (KG): 66.3  Vital Signs: Temp: 98 F (36.7 C) (07/13 1203) Temp Source: Oral (07/13 1203) BP: 121/59 (07/13 1203) Pulse Rate: 65 (07/13 1203)  Labs: Recent Labs    11/21/19 0237 11/21/19 0237 11/21/19 1755 11/22/19 0334 11/22/19 1433 11/22/19 1624 11/22/19 2227 11/23/19 0649 11/23/19 1622  HGB 10.8*   < >  --  10.7*  --   --   --  12.0  --   HCT 30.9*  --   --  31.7*  --   --   --  36.0  --   PLT 210  --   --  222  --   --   --  315  --   HEPARINUNFRC  --   --   --   --   --   --  0.12* 0.33 0.33  CREATININE 0.54   < > 0.68 0.72  --   --   --  0.61  --   TROPONINIHS  --   --   --   --  8 7  --   --   --    < > = values in this interval not displayed.     Estimated Creatinine Clearance: 51 mL/min (by C-G formula based on SCr of 0.61 mg/dL).   Medical History: Past Medical History:  Diagnosis Date  . ALLERGIC RHINITIS   . Anxiety   . Asthma   . Blood type O+   . Cervical strain, acute   . Cirrhosis ([place])   . Colon polyp 2010   adenoma  . Diverticulosis of colon   . Dizziness   . Esophageal varices ([place])   . Fibromyalgia   . GERD (gastroesophageal reflux disease)   . History of nephrolithiasis   . Hypercholesterolemia    borderline  . Hypertension   . IBS (irritable bowel syndrome)   . Osteoarthritis   . Personal history of allergy to unspecified medicinal agent   . Postconcussion syndrome   . Vitamin D deficiency     Assessment: 79 yr old female admitted with C. diff colitis developed afib in the setting of acute illness.  PMH is  significant for NASH liver cirrhosis and esophageal varices, platelets 315, LFTs WNL.  Pt was on Lovenox for VTE prophylaxis (last dose given 7/11 PM). Pharmacy was consulted to dose heparin for afib.   Heparin level ~7 hrs after heparin infusion was increased to 1150 units/hr was 0.33 units/ml, which is within the goal range for this pt. H/H, platelets WNL. Per RN, no issues with IV or bleeding observed.   Goal of Therapy:  Heparin level 0.3-0.7 units/ml (would target low end of range, due to esophageal varices) Monitor platelets by anticoagulation protocol: Yes   Plan:  Continue heparin infusion at 1150 units/hr Check 8-hr confirmatory heparin level Monitor daily heparin level, CBC Monitor for signs/symptoms of bleeding  Gillermina Hu, PharmD, BCPS, [facility] Clinical Pharmacist 11/23/2019 5:16 PM

## 2019-11-23 NOTE — Progress Notes (Signed)
[place] for Heparin Indication: atrial fibrillation  Allergies  Allergen Reactions  . Ace Inhibitors     cough  . Amoxicillin Itching    REACTION: unknown? pain in right kidney  . Benicar Hct [Olmesartan Medoxomil-Hctz]     Extreme weakness  . Budesonide-Formoterol Fumarate     Causes tremors and numbness  . Chlorzoxazone [Chlorzoxazone]   . Citalopram Hydrobromide     REACTION: hives  . Citalopram Hydrobromide   . Clonidine Derivatives   . Cymbalta [Duloxetine Hcl]   . Droperidol     REACTION: hives  . Flexeril [Cyclobenzaprine Hcl]   . Fluconazole Nausea And Vomiting and Other (See Comments)    fatigue  . Gabapentin Itching  . Ketorolac Tromethamine     REACTION: hives  . Ketorolac Tromethamine   . Metoclopramide Hcl   . Mometasone Furo-Formoterol Fum     Causes sore throat, blurred vision and unable to sleep--started on med 09-17-10  . Morphine     REACTION: hives and itching  . Olmesartan Medoxomil     REACTION: fatique  . Breo Ellipta [Fluticasone Furoate-Vilanterol] Itching    Pt reports itching with Memory Dance is related to being lactose intolerant.     Patient Measurements: Height: 5' 3"  (160 cm) Weight: 68 kg (150 lb) IBW/kg (Calculated) : 52.4 HEPARIN DW (KG): 66.3  Vital Signs: Temp: 98.7 F (37.1 C) (07/13 0558) Temp Source: Oral (07/13 0558) BP: 126/56 (07/13 0558) Pulse Rate: 71 (07/13 0823)  Labs: Recent Labs    11/21/19 0237 11/21/19 0237 11/21/19 1755 11/22/19 0334 11/22/19 1433 11/22/19 1624 11/22/19 2227 11/23/19 0649  HGB 10.8*   < >  --  10.7*  --   --   --  12.0  HCT 30.9*  --   --  31.7*  --   --   --  36.0  PLT 210  --   --  222  --   --   --  315  HEPARINUNFRC  --   --   --   --   --   --  0.12* 0.33  CREATININE 0.54   < > 0.68 0.72  --   --   --  0.61  TROPONINIHS  --   --   --   --  8 7  --   --    < > = values in this interval not displayed.    Estimated Creatinine Clearance: 51 mL/min  (by C-G formula based on SCr of 0.61 mg/dL).   Medical History: Past Medical History:  Diagnosis Date  . ALLERGIC RHINITIS   . Anxiety   . Asthma   . Blood type O+   . Cervical strain, acute   . Cirrhosis ([place])   . Colon polyp 2010   adenoma  . Diverticulosis of colon   . Dizziness   . Esophageal varices ([place])   . Fibromyalgia   . GERD (gastroesophageal reflux disease)   . History of nephrolithiasis   . Hypercholesterolemia    borderline  . Hypertension   . IBS (irritable bowel syndrome)   . Osteoarthritis   . Personal history of allergy to unspecified medicinal agent   . Postconcussion syndrome   . Vitamin D deficiency     Medications:  Scheduled:  . amLODipine  2.5 mg Oral Daily  . losartan  25 mg Oral Daily  . magnesium oxide  400 mg Oral Daily  . mometasone-formoterol  2 puff Inhalation BID  . omega-3  acid ethyl esters  2 g Oral BID  . propranolol  10 mg Oral BID  . vancomycin  125 mg Oral QID   Followed by  . [START ON 12/04/2019] vancomycin  125 mg Oral BID   Followed by  . [START ON 12/12/2019] vancomycin  125 mg Oral Daily   Followed by  . [START ON 12/19/2019] vancomycin  125 mg Oral QODAY   Followed by  . [START ON 12/27/2019] vancomycin  125 mg Oral Q3 days    Assessment: 79 yo F admitted with C. diff colitis who developed afib in the setting of acute illness.  PMH significant for NASH liver cirrhosis, PLTC >200, LFTs wnl.  Pt was on Lovenox for VTE ppx, last dose given 7/11 pm. Started heparin infusion at 900 units/hr, 8-hour heparin level resulted at 0.12. 1000 unit bolus was given and heparin drip was increased by 3 units/kg/hr to 1100 units/hr. 8-hour heparin level was 0.33 but was drawn an hour early.   Goal of Therapy:  Heparin level 0.3-0.7 units/ml Monitor platelets by anticoagulation protocol: Yes   Plan:  Increase heparin drip to 1150 units/hr Obtain an 8-hour heparin level at 1700 Monitor H&H and platelets  Shauna Hugh, PharmD, [place]   PGY-1 Pharmacy Resident Office: [phone] 11/23/2019 8:40 AM  Please check [email or web address] for unit-specific pharmacy phone numbers.

## 2019-11-24 LAB — CBC WITH DIFFERENTIAL/PLATELET
Abs Immature Granulocytes: 0.07 10*3/uL (ref 0.00–0.07)
Basophils Absolute: 0.1 10*3/uL (ref 0.0–0.1)
Basophils Relative: 1 %
Eosinophils Absolute: 0.2 10*3/uL (ref 0.0–0.5)
Eosinophils Relative: 2 %
HCT: 33.7 % — ABNORMAL LOW (ref 36.0–46.0)
Hemoglobin: 11.3 g/dL — ABNORMAL LOW (ref 12.0–15.0)
Immature Granulocytes: 1 %
Lymphocytes Relative: 34 %
Lymphs Abs: 3.3 10*3/uL (ref 0.7–4.0)
MCH: 30.4 pg (ref 26.0–34.0)
MCHC: 33.5 g/dL (ref 30.0–36.0)
MCV: 90.6 fL (ref 80.0–100.0)
Monocytes Absolute: 1.3 10*3/uL — ABNORMAL HIGH (ref 0.1–1.0)
Monocytes Relative: 13 %
Neutro Abs: 4.9 10*3/uL (ref 1.7–7.7)
Neutrophils Relative %: 49 %
Platelets: 294 10*3/uL (ref 150–400)
RBC: 3.72 MIL/uL — ABNORMAL LOW (ref 3.87–5.11)
RDW: 13.2 % (ref 11.5–15.5)
WBC: 9.8 10*3/uL (ref 4.0–10.5)
nRBC: 0 % (ref 0.0–0.2)

## 2019-11-24 LAB — COMPREHENSIVE METABOLIC PANEL
ALT: 18 U/L (ref 0–44)
AST: 23 U/L (ref 15–41)
Albumin: 3.1 g/dL — ABNORMAL LOW (ref 3.5–5.0)
Alkaline Phosphatase: 58 U/L (ref 38–126)
Anion gap: 10 (ref 5–15)
BUN: 8 mg/dL (ref 8–23)
CO2: 23 mmol/L (ref 22–32)
Calcium: 9.2 mg/dL (ref 8.9–10.3)
Chloride: 105 mmol/L (ref 98–111)
Creatinine, Ser: 0.63 mg/dL (ref 0.44–1.00)
GFR calc Af Amer: >60 mL/min (ref >60)
GFR calc non Af Amer: >60 mL/min (ref >60)
Glucose, Bld: 106 mg/dL — ABNORMAL HIGH (ref 70–99)
Potassium: 4.9 mmol/L (ref 3.5–5.1)
Sodium: 138 mmol/L (ref 135–145)
Total Bilirubin: 0.4 mg/dL (ref 0.3–1.2)
Total Protein: 6.1 g/dL — ABNORMAL LOW (ref 6.5–8.1)

## 2019-11-24 LAB — PHOSPHORUS: Phosphorus: 3 mg/dL (ref 2.5–4.6)

## 2019-11-24 LAB — CULTURE, BLOOD (ROUTINE X 2)
Culture: NO GROWTH
Culture: NO GROWTH
Special Requests: ADEQUATE
Special Requests: ADEQUATE

## 2019-11-24 LAB — MAGNESIUM: Magnesium: 2 mg/dL (ref 1.7–2.4)

## 2019-11-24 LAB — HEPARIN LEVEL (UNFRACTIONATED): Heparin Unfractionated: 0.31 IU/mL (ref 0.30–0.70)

## 2019-11-24 MED ORDER — AZITHROMYCIN 250 MG PO TABS
500.0000 mg | ORAL_TABLET | Freq: Every day | ORAL | Status: AC
  Administered 2019-11-24 – 2019-11-26 (×3): 500 mg via ORAL
  Filled 2019-11-24 (×3): qty 2

## 2019-11-24 NOTE — Progress Notes (Signed)
PROGRESS NOTE    [name]  DGL:[id number] DOB: 28-Apr-1941 DOA: 11/19/2019 PCP: Lauree Chandler, NP     Brief Narrative:  79 y.o. WF lives in [facility] PMHx Fibromyalgia, cirrhosis of the liver secondary to [place], esophageal varices, HTN, HLD,, asthma, irritable bowel syndrome, presents to the ER because of ongoing weakness for the last 1 week where patient was feeling intensely fatigued.  Patient over the last 1 week he has been having increased frequency of urination and diarrhea.  Diarrhea is watery.  Denies any chest pain or shortness of breath.  Denies any loss of consciousness.  Given the worsening symptoms patient presents to the ER.  ED Course: In the ER patient is found to be febrile with a temperature 102 F and lab work show sodium 129 WBC 11.9 lactic acid was normal EKG shows normal sinus rhythm.  Chest x-ray was unremarkable UA is going which is concerning for UTI.  Given the symptoms patient had blood cultures drawn started on fluids empiric antibiotics admitted for further management.  Subjective: Patient mentions that she had about 7-8 bowel movements during the course of the night.  Still pretty loose.  Denies any blood in the stool.  Denies any significant abdominal pain.  No nausea or vomiting.    Assessment & Plan:  C. difficile colitis  C. difficile test was positive.  Patient was started on vancomycin.  Has not shown much improvement in the last several days.  GI pathogen panel also positive for Campylobacter.  We will start the patient on azithromycin as well since she has not shown much improvement.  Her abdomen remains benign.  Discussed in detail with patient.  Hopefully she will start showing some improvement soon.  She is afebrile.  WBC is normal.  Renal function is normal.   New onset atrial fibrillation Patient noted to have atrial fibrillation while she was here in the hospital.  Apparently no similar history in the past.  TSH was normal.   Echocardiogram has been done as well.  Shows normal systolic function.  Some left atrial dilatation is noted.  No significant valvular disease.  Heart rate seems to be stable.  Patient on propranolol which will be continued for now.  Patient started on heparin infusion for stroke prevention.  Will need to be switched to oral agent when she is improving.    Hyponatremia Likely due to hypovolemia.  Now resolved.    Hypokalemia Likely due to GI loss.  Potassium is repleted.  4.9 today.    Hypomagnesmia Magnesium is 2.0 today.    Ascites? -US abdomen negative for ascites.  History of liver cirrhosis secondary to NASH Seems to be stable.  Continue to monitor.    Essential HTN  Monitor blood pressures closely.  Stable currently.  Asthma -Currently not an issue  DVT prophylaxis: Heparin drip  Code Status: DNR Family Communication: Discussed with the patient Status is: Inpatient  Remains inpatient appropriate because:IV treatments appropriate due to intensity of illness or inability to take PO   Dispo: The patient is from: Home              Anticipated d/c is to: Home              Anticipated d/c date is: 3 days              Patient currently is not medically stable to d/c.     Consultants: None currently   Procedures/Significant Events:  7/9 CXR; negative  cardiopulmonary disease 7/10 [place] ascites (abdomen); negative for ascites   Cultures 7/10 positive C. difficile antigen, positive C. difficile toxin   Antimicrobials: Anti-infectives (From admission, onward)   Start     Ordered Stop   12/27/19 1000  vancomycin (VANCOCIN) 50 mg/mL oral solution 125 mg     Discontinue    "Followed by" Linked Group Details   11/20/19 1805 01/05/20 0959   12/19/19 1000  vancomycin (VANCOCIN) 50 mg/mL oral solution 125 mg     Discontinue    "Followed by" Linked Group Details   11/20/19 1805 12/27/19 0959   12/12/19 1000  vancomycin (VANCOCIN) 50 mg/mL oral solution 125 mg      Discontinue    "Followed by" Linked Group Details   11/20/19 1805 12/19/19 0959   12/04/19 2200  vancomycin (VANCOCIN) 50 mg/mL oral solution 125 mg     Discontinue    "Followed by" Linked Group Details   11/20/19 1805 12/11/19 2159   11/20/19 2000  vancomycin (VANCOCIN) 50 mg/mL oral solution 125 mg     Discontinue    "Followed by" Linked Group Details   11/20/19 1805 12/04/19 2159   11/20/19 1000  vancomycin (VANCOREADY) IVPB 500 mg/100 mL  Status:  Discontinued        11/19/19 2029 11/20/19 1805   11/19/19 2030  vancomycin (VANCOREADY) IVPB 1250 mg/250 mL        11/19/19 2028 11/19/19 2310   11/19/19 1545  cefTRIAXone (ROCEPHIN) 1 g in sodium chloride 0.9 % 100 mL IVPB        11/19/19 1543 11/19/19 1956   11/19/19 0000  cephALEXin (KEFLEX) 500 MG capsule     Discontinue     11/19/19 1754 11/24/19 2359      Continuous Infusions: . sodium chloride 50 mL/hr at 11/22/19 1806  . heparin 1,150 Units/hr (11/24/19 0940)     Objective: Vitals:   11/23/19 2125 11/24/19 0048 11/24/19 0645 11/24/19 0825  BP:  (!) 130/57 (!) 135/58 110/67  Pulse:  64 (!) 56 68  Resp:  16 18   Temp:  98.2 F (36.8 C) 98.1 F (36.7 C)   TempSrc:  Oral Oral   SpO2: 98% 93% 97%   Weight:      Height:        Intake/Output Summary (Last 24 hours) at 11/24/2019 1208 Last data filed at 11/24/2019 0400 Gross per 24 hour  Intake 810.85 ml  Output --  Net 810.85 ml   Filed Weights   11/19/19 1106  Weight: 68 kg    Physical Exam:  General appearance: Awake alert.  In no distress Resp: Clear to auscultation bilaterally.  Normal effort Cardio: S1-S2 is normal regular.  No S3-S4.  No rubs murmurs or bruit GI: Abdomen is soft.  Nondistended.  Mildly tender in the right lower area without any rebound rigidity or guarding.  No masses organomegaly.  Bowel sounds present.   Extremities: No edema.  Full range of motion of lower extremities. Neurologic: Alert and oriented x3.  No focal neurological  deficits.      Lab Data Reviewed:   CBC: Recent Labs  Lab 11/20/19 0555 11/21/19 0237 11/22/19 0334 11/23/19 0649 11/24/19 0454  WBC 9.3 9.0 8.0 8.0 9.8  NEUTROABS  --  4.5 2.9 3.5 4.9  HGB 11.4* 10.8* 10.7* 12.0 11.3*  HCT 33.6* 30.9* 31.7* 36.0 33.7*  MCV 88.7 87.8 88.1 89.3 90.6  PLT 211 210 222 315 160   Basic Metabolic Panel: Recent Labs  Lab 11/21/19 0237 11/21/19 1755 11/22/19 0334 11/23/19 0649 11/24/19 0454  NA 131* 135 138 138 138  K 2.7* 3.9 3.9 3.6 4.9  CL 102 105 106 104 105  CO2 21* 22 24 23 23   GLUCOSE 108* 158* 102* 112* 106*  BUN 7* 6* <5* <5* 8  CREATININE 0.54 0.68 0.72 0.61 0.63  CALCIUM 8.4* 8.7* 8.6* 9.3 9.2  MG 1.7 1.7 1.7 1.6* 2.0  PHOS 2.4*  --  2.8 3.3 3.0   GFR: Estimated Creatinine Clearance: 51 mL/min (by C-G formula based on SCr of 0.63 mg/dL). Liver Function Tests: Recent Labs  Lab 11/19/19 1121 11/21/19 0237 11/22/19 0334 11/23/19 0649 11/24/19 0454  AST 24 23 23 26 23   ALT 18 14 16 18 18   ALKPHOS 59 44 62 56 58  BILITOT 1.0 0.5 0.6 0.9 0.4  PROT 6.3* 5.0* 5.4* 6.2* 6.1*  ALBUMIN 3.7 2.7* 2.9* 3.3* 3.1*   Recent Labs  Lab 11/19/19 1121  LIPASE 35   Cardiac Enzymes: Recent Labs  Lab 11/20/19 0555  CKTOTAL 57   Thyroid Function Tests: Recent Labs    11/22/19 1433  TSH 1.666   Sepsis Labs: Recent Labs  Lab 11/19/19 1121 11/19/19 2005  LATICACIDVEN 1.2 1.1    Recent Results (from the past 240 hour(s))  Urine culture     Status: None   Collection Time: 11/19/19 12:20 PM   Specimen: Urine, Random  Result Value Ref Range Status   Specimen Description URINE, RANDOM  Final   Special Requests NONE  Final   Culture   Final    NO GROWTH Performed at [facility], [place] [address], [place], [place] [address]    Report Status 11/20/2019 FINAL  Final  SARS Coronavirus 2 by RT PCR (hospital order, performed in [facility] hospital lab) Nasopharyngeal Nasopharyngeal Swab     Status: None   Collection  Time: 11/19/19 12:20 PM   Specimen: Nasopharyngeal Swab  Result Value Ref Range Status   SARS Coronavirus 2 NEGATIVE NEGATIVE Final    Comment: (NOTE) SARS-CoV-2 target nucleic acids are NOT DETECTED.  The SARS-CoV-2 RNA is generally detectable in upper and lower respiratory specimens during the acute phase of infection. The lowest concentration of SARS-CoV-2 viral copies this assay can detect is 250 copies / mL. A negative result does not preclude SARS-CoV-2 infection and should not be used as the sole basis for treatment or other patient management decisions.  A negative result may occur with improper specimen collection / handling, submission of specimen other than nasopharyngeal swab, presence of viral mutation(s) within the areas targeted by this assay, and inadequate number of viral copies (<250 copies / mL). A negative result must be combined with clinical observations, patient history, and epidemiological information.  Fact Sheet for Patients:   StrictlyIdeas.no  Fact Sheet for Healthcare Providers: BankingDealers.co.za  This test is not yet approved or  cleared by the [place] FDA and has been authorized for detection and/or diagnosis of SARS-CoV-2 by FDA under an Emergency Use Authorization (EUA).  This EUA will remain in effect (meaning this test can be used) for the duration of the COVID-19 declaration under Section 564(b)(1) of the Act, 21 U.S.C. section 360bbb-3(b)(1), unless the authorization is terminated or revoked sooner.  Performed at [facility], [place] [address] [place], [place] [address]   Culture, blood (routine x 2)     Status: None   Collection Time: 11/19/19  8:00 PM   Specimen: BLOOD  LEFT HAND  Result Value Ref Range Status   Specimen Description BLOOD LEFT HAND  Final   Special Requests   Final    BOTTLES DRAWN AEROBIC AND ANAEROBIC Blood Culture adequate volume   Culture   Final    NO  GROWTH 5 DAYS Performed at [facility], [address], [place], [place] [address]    Report Status 11/24/2019 FINAL  Final  Culture, blood (routine x 2)     Status: None   Collection Time: 11/19/19  9:00 PM   Specimen: BLOOD RIGHT HAND  Result Value Ref Range Status   Specimen Description BLOOD RIGHT HAND  Final   Special Requests   Final    BOTTLES DRAWN AEROBIC AND ANAEROBIC Blood Culture adequate volume   Culture   Final    NO GROWTH 5 DAYS Performed at [facility], [place] [address] [place], [place] [address]    Report Status 11/24/2019 FINAL  Final  Gastrointestinal Panel by PCR , Stool     Status: Abnormal   Collection Time: 11/20/19  3:56 PM   Specimen: STOOL  Result Value Ref Range Status   Campylobacter species DETECTED (A) NOT DETECTED Final    Comment: RESULT CALLED TO, READ BACK BY AND VERIFIED WITH: MELON BONADO @2011  11/20/19 MJU    Plesimonas shigelloides NOT DETECTED NOT DETECTED Final   Salmonella species NOT DETECTED NOT DETECTED Final   Yersinia enterocolitica NOT DETECTED NOT DETECTED Final   Vibrio species NOT DETECTED NOT DETECTED Final   Vibrio cholerae NOT DETECTED NOT DETECTED Final   Enteroaggregative E coli (EAEC) NOT DETECTED NOT DETECTED Final   Enteropathogenic E coli (EPEC) NOT DETECTED NOT DETECTED Final   Enterotoxigenic E coli (ETEC) NOT DETECTED NOT DETECTED Final   Shiga like toxin producing E coli (STEC) NOT DETECTED NOT DETECTED Final   Shigella/Enteroinvasive E coli (EIEC) NOT DETECTED NOT DETECTED Final   Cryptosporidium NOT DETECTED NOT DETECTED Final   Cyclospora cayetanensis NOT DETECTED NOT DETECTED Final   Entamoeba histolytica NOT DETECTED NOT DETECTED Final   Giardia lamblia NOT DETECTED NOT DETECTED Final   Adenovirus F40/41 NOT DETECTED NOT DETECTED Final   Astrovirus NOT DETECTED NOT DETECTED Final   Norovirus GI/GII NOT DETECTED NOT DETECTED Final   Rotavirus A NOT DETECTED NOT DETECTED Final   Sapovirus (I, II,  IV, and V) NOT DETECTED NOT DETECTED Final    Comment: Performed at [facility], ., [place], [place] [address]  C Difficile Quick Screen w PCR reflex     Status: Abnormal   Collection Time: 11/20/19  3:56 PM   Specimen: STOOL  Result Value Ref Range Status   C Diff antigen POSITIVE (A) NEGATIVE Final   C Diff toxin POSITIVE (A) NEGATIVE Final   C Diff interpretation Toxin producing C. difficile detected.  Final    Comment: CRITICAL RESULT CALLED TO, READ BACK BY AND VERIFIED WITH: RN ALA ABU AT 1724 11/20/19 BY MM Performed at [facility], [address] [place], [place] [address]          Radiology Studies: ECHOCARDIOGRAM COMPLETE  Result Date: 11/23/2019    ECHOCARDIOGRAM REPORT   Patient Name:   [name] [facility] Date of Exam: 11/23/2019 Medical Rec #:  [id number]        Height:       63.0 in Accession #:    [id number]       Weight:  150.0 lb Date of Birth:  Aug 24, 1940         BSA:          20.711 m Patient Age:    35 years         BP:           126/56 mmHg Patient Gender: F                HR:           65 bpm. Exam Location:  Inpatient Procedure: 2D Echo Indications:    Atrial fibrillation 427.31  History:        Patient has no prior history of Echocardiogram examinations.                 Cirrhosis. C.diff, Arrythmias:Atrial Fibrillation,                 Signs/Symptoms:Fever; Risk Factors:Hypertension and                 Dyslipidemia.  Sonographer:    Johny Chess Referring Phys: [id number] [place]  1. Left ventricular ejection fraction, by estimation, is 60 to 65%. The left ventricle has normal function. The left ventricle has no regional wall motion abnormalities. Left ventricular diastolic parameters are indeterminate.  2. Right ventricular systolic function is normal. The right ventricular size is normal. There is mildly elevated pulmonary artery systolic pressure.  3. Left atrial size was mild to moderately dilated.  4. The mitral  valve is normal in structure. Trivial mitral valve regurgitation. No evidence of mitral stenosis.  5. The aortic valve is tricuspid. Aortic valve regurgitation is not visualized. No aortic stenosis is present.  6. The inferior vena cava is normal in size with <50% respiratory variability, suggesting right atrial pressure of 8 mmHg. Comparison(s): No prior Echocardiogram. Conclusion(s)/Recommendation(s): Normal biventricular function without evidence of hemodynamically significant valvular heart disease. FINDINGS  Left Ventricle: Left ventricular ejection fraction, by estimation, is 60 to 65%. The left ventricle has normal function. The left ventricle has no regional wall motion abnormalities. The left ventricular internal cavity size was normal in size. There is  no left ventricular hypertrophy. Left ventricular diastolic parameters are indeterminate. Right Ventricle: The right ventricular size is normal. No increase in right ventricular wall thickness. Right ventricular systolic function is normal. There is mildly elevated pulmonary artery systolic pressure. The tricuspid regurgitant velocity is 2.89  m/s, and with an assumed right atrial pressure of 8 mmHg, the estimated right ventricular systolic pressure is 85.8 mmHg. Left Atrium: Left atrial size was mild to moderately dilated. Right Atrium: Right atrial size was normal in size. Pericardium: There is no evidence of pericardial effusion. Mitral Valve: The mitral valve is normal in structure. Mild mitral annular calcification. Trivial mitral valve regurgitation. No evidence of mitral valve stenosis. Tricuspid Valve: The tricuspid valve is normal in structure. Tricuspid valve regurgitation is mild. Aortic Valve: The aortic valve is tricuspid. Aortic valve regurgitation is not visualized. No aortic stenosis is present. Pulmonic Valve: The pulmonic valve was grossly normal. Pulmonic valve regurgitation is trivial. No evidence of pulmonic stenosis. Aorta: The aortic  root, ascending aorta and aortic arch are all structurally normal, with no evidence of dilitation or obstruction. Venous: The inferior vena cava is normal in size with less than 50% respiratory variability, suggesting right atrial pressure of 8 mmHg. IAS/Shunts: The atrial septum is grossly normal.  LEFT VENTRICLE PLAX 2D LVIDd:         4.60  cm  Diastology LVIDs:         2.60 cm  LV e' lateral:   9.57 cm/s LV PW:         0.90 cm  LV E/e' lateral: 10.9 LV IVS:        0.80 cm  LV e' medial:    8.49 cm/s LVOT diam:     1.90 cm  LV E/e' medial:  12.2 LV SV:         65 LV SV Index:   38 LVOT Area:     2.84 cm  RIGHT VENTRICLE             IVC RV S prime:     11.30 cm/s  IVC diam: 2.00 cm TAPSE (M-mode): 2.4 cm LEFT ATRIUM             Index       RIGHT ATRIUM           Index LA diam:        3.90 cm 2.28 cm/m  RA Area:     14.20 cm LA Vol (A2C):   63.0 ml 36.82 ml/m RA Volume:   35.00 ml  20.45 ml/m LA Vol (A4C):   63.8 ml 37.29 ml/m LA Biplane Vol: 65.5 ml 38.28 ml/m  AORTIC VALVE LVOT Vmax:   105.00 cm/s LVOT Vmean:  66.900 cm/s LVOT VTI:    0.230 m  AORTA Ao Root diam: 3.20 cm Ao Asc diam:  3.30 cm MITRAL VALVE                TRICUSPID VALVE MV Area (PHT): 3.08 cm     TV Peak grad:   33.9 mmHg MV Decel Time: 246 msec     TV Vmax:        2.91 m/s MV E velocity: 104.00 cm/s  TR Peak grad:   33.4 mmHg MV A velocity: 50.10 cm/s   TR Vmax:        289.00 cm/s MV E/A ratio:  2.08                             SHUNTS                             Systemic VTI:  0.23 m                             Systemic Diam: 1.90 cm Buford Dresser MD Electronically signed by Buford Dresser MD Signature Date/Time: 11/23/2019/5:49:20 PM    Final         Scheduled Meds: . amLODipine  2.5 mg Oral Daily  . azithromycin  500 mg Oral Daily  . losartan  25 mg Oral Daily  . mometasone-formoterol  2 puff Inhalation BID  . omega-3 acid ethyl esters  2 g Oral BID  . propranolol  10 mg Oral BID  . saccharomyces boulardii  250  mg Oral BID  . vancomycin  125 mg Oral QID   Followed by  . [START ON 12/04/2019] vancomycin  125 mg Oral BID   Followed by  . [START ON 12/12/2019] vancomycin  125 mg Oral Daily   Followed by  . [START ON 12/19/2019] vancomycin  125 mg Oral QODAY   Followed by  . [START ON 12/27/2019] vancomycin  125 mg Oral Q3 days   Continuous  Infusions: . sodium chloride 50 mL/hr at 11/22/19 1806  . heparin 1,150 Units/hr (11/24/19 0940)     LOS: 4 days    Bonnielee Haff, MD [facility]   If 7PM-7AM, please contact night-coverage [email or web address] Password [facility] 11/24/2019, 12:08 PM

## 2019-11-24 NOTE — Progress Notes (Signed)
[place] for Heparin Indication: atrial fibrillation  Allergies  Allergen Reactions  . Ace Inhibitors     cough  . Amoxicillin Itching    REACTION: unknown? pain in right kidney  . Benicar Hct [Olmesartan Medoxomil-Hctz]     Extreme weakness  . Budesonide-Formoterol Fumarate     Causes tremors and numbness  . Chlorzoxazone [Chlorzoxazone]   . Citalopram Hydrobromide     REACTION: hives  . Citalopram Hydrobromide   . Clonidine Derivatives   . Cymbalta [Duloxetine Hcl]   . Droperidol     REACTION: hives  . Flexeril [Cyclobenzaprine Hcl]   . Fluconazole Nausea And Vomiting and Other (See Comments)    fatigue  . Gabapentin Itching  . Ketorolac Tromethamine     REACTION: hives  . Ketorolac Tromethamine   . Metoclopramide Hcl   . Mometasone Furo-Formoterol Fum     Causes sore throat, blurred vision and unable to sleep--started on med 09-17-10  . Morphine     REACTION: hives and itching  . Olmesartan Medoxomil     REACTION: fatique  . Breo Ellipta [Fluticasone Furoate-Vilanterol] Itching    Pt reports itching with [place] is related to being lactose intolerant.     Patient Measurements: Height: 5' 3"  (160 cm) Weight: 68 kg (150 lb) IBW/kg (Calculated) : 52.4 HEPARIN DW (KG): 66.3  Vital Signs: Temp: 98.1 F (36.7 C) (07/14 0645) Temp Source: Oral (07/14 0645) BP: 135/58 (07/14 0645) Pulse Rate: 56 (07/14 0645)  Labs: Recent Labs     0000 11/22/19 0334 11/22/19 1433 11/22/19 1624 11/22/19 2227 11/23/19 0649 11/23/19 1622 11/24/19 0454  HGB   < > 10.7*  --   --   --  12.0  --  11.3*  HCT  --  31.7*  --   --   --  36.0  --  33.7*  PLT  --  222  --   --   --  315  --  294  HEPARINUNFRC  --   --   --   --    < > 0.33 0.33 0.31  CREATININE  --  0.72  --   --   --  0.61  --  0.63  TROPONINIHS  --   --  8 7  --   --   --   --    < > = values in this interval not displayed.    Estimated Creatinine Clearance: 51 mL/min (by C-G  formula based on SCr of 0.63 mg/dL).   Medical History: Past Medical History:  Diagnosis Date  . ALLERGIC RHINITIS   . Anxiety   . Asthma   . Blood type O+   . Cervical strain, acute   . Cirrhosis ([place])   . Colon polyp 2010   adenoma  . Diverticulosis of colon   . Dizziness   . Esophageal varices ([place])   . Fibromyalgia   . GERD (gastroesophageal reflux disease)   . History of nephrolithiasis   . Hypercholesterolemia    borderline  . Hypertension   . IBS (irritable bowel syndrome)   . Osteoarthritis   . Personal history of allergy to unspecified medicinal agent   . Postconcussion syndrome   . Vitamin D deficiency     Medications:  Scheduled:  . amLODipine  2.5 mg Oral Daily  . losartan  25 mg Oral Daily  . mometasone-formoterol  2 puff Inhalation BID  . omega-3 acid ethyl esters  2 g Oral BID  .  propranolol  10 mg Oral BID  . saccharomyces boulardii  250 mg Oral BID  . vancomycin  125 mg Oral QID   Followed by  . [START ON 12/04/2019] vancomycin  125 mg Oral BID   Followed by  . [START ON 12/12/2019] vancomycin  125 mg Oral Daily   Followed by  . [START ON 12/19/2019] vancomycin  125 mg Oral QODAY   Followed by  . [START ON 12/27/2019] vancomycin  125 mg Oral Q3 days    Assessment: 79 yo F admitted with C. diff colitis who developed afib in the setting of acute illness. PMH significant for NASH liver cirrhosis, PLTC >200, LFTs wnl.  Pt was on Lovenox for VTE ppx, last dose given 7/11 pm. Started heparin infusion at 900 units/hr, 8-hour heparin level resulted at 0.12. 1000 unit bolus was given and heparin drip was increased by 3 units/kg/hr to 1100 units/hr. 8-hour heparin level was 0.33 but was drawn an hour early. Heparin was increased to 1150 units/hr and the 8-hour level and level with AM labs 7/14 was therapeutic at 0.33 and 0.31, respectively. Will target lower level of rnage due to esophageal varices.  Goal of Therapy:  Heparin level 0.3-0.7 units/ml Monitor  platelets by anticoagulation protocol: Yes   Plan:  Continue heparin drip at 1150 units/hr Obtain a heparin level with AM labs Monitor H&H and platelets  Shauna Hugh, PharmD, [place]  PGY-1 Pharmacy Resident Office: [phone] 11/24/2019 8:10 AM  Please check [email or web address] for unit-specific pharmacy phone numbers.

## 2019-11-25 DIAGNOSIS — D72829 Elevated white blood cell count, unspecified: Secondary | ICD-10-CM

## 2019-11-25 DIAGNOSIS — I10 Essential (primary) hypertension: Secondary | ICD-10-CM

## 2019-11-25 DIAGNOSIS — A045 Campylobacter enteritis: Secondary | ICD-10-CM

## 2019-11-25 DIAGNOSIS — A0472 Enterocolitis due to Clostridium difficile, not specified as recurrent: Principal | ICD-10-CM

## 2019-11-25 LAB — COMPREHENSIVE METABOLIC PANEL
ALT: 16 U/L (ref 0–44)
AST: 23 U/L (ref 15–41)
Albumin: 2.9 g/dL — ABNORMAL LOW (ref 3.5–5.0)
Alkaline Phosphatase: 52 U/L (ref 38–126)
Anion gap: 10 (ref 5–15)
BUN: 9 mg/dL (ref 8–23)
CO2: 20 mmol/L — ABNORMAL LOW (ref 22–32)
Calcium: 9 mg/dL (ref 8.9–10.3)
Chloride: 107 mmol/L (ref 98–111)
Creatinine, Ser: 0.62 mg/dL (ref 0.44–1.00)
GFR calc Af Amer: >60 mL/min (ref >60)
GFR calc non Af Amer: >60 mL/min (ref >60)
Glucose, Bld: 92 mg/dL (ref 70–99)
Potassium: 3.3 mmol/L — ABNORMAL LOW (ref 3.5–5.1)
Sodium: 137 mmol/L (ref 135–145)
Total Bilirubin: 0.8 mg/dL (ref 0.3–1.2)
Total Protein: 5.5 g/dL — ABNORMAL LOW (ref 6.5–8.1)

## 2019-11-25 LAB — MAGNESIUM: Magnesium: 1.7 mg/dL (ref 1.7–2.4)

## 2019-11-25 LAB — CBC WITH DIFFERENTIAL/PLATELET
Abs Immature Granulocytes: 0.18 10*3/uL — ABNORMAL HIGH (ref 0.00–0.07)
Basophils Absolute: 0.1 10*3/uL (ref 0.0–0.1)
Basophils Relative: 1 %
Eosinophils Absolute: 0.3 10*3/uL (ref 0.0–0.5)
Eosinophils Relative: 2 %
HCT: 32.7 % — ABNORMAL LOW (ref 36.0–46.0)
Hemoglobin: 10.7 g/dL — ABNORMAL LOW (ref 12.0–15.0)
Immature Granulocytes: 2 %
Lymphocytes Relative: 38 %
Lymphs Abs: 4.2 10*3/uL — ABNORMAL HIGH (ref 0.7–4.0)
MCH: 29.8 pg (ref 26.0–34.0)
MCHC: 32.7 g/dL (ref 30.0–36.0)
MCV: 91.1 fL (ref 80.0–100.0)
Monocytes Absolute: 1.3 10*3/uL — ABNORMAL HIGH (ref 0.1–1.0)
Monocytes Relative: 12 %
Neutro Abs: 5 10*3/uL (ref 1.7–7.7)
Neutrophils Relative %: 45 %
Platelets: 302 10*3/uL (ref 150–400)
RBC: 3.59 MIL/uL — ABNORMAL LOW (ref 3.87–5.11)
RDW: 13.3 % (ref 11.5–15.5)
WBC: 11.1 10*3/uL — ABNORMAL HIGH (ref 4.0–10.5)
nRBC: 0 % (ref 0.0–0.2)

## 2019-11-25 LAB — HEPARIN LEVEL (UNFRACTIONATED): Heparin Unfractionated: 0.4 IU/mL (ref 0.30–0.70)

## 2019-11-25 LAB — PHOSPHORUS: Phosphorus: 4.4 mg/dL (ref 2.5–4.6)

## 2019-11-25 MED ORDER — FIDAXOMICIN 200 MG PO TABS
200.0000 mg | ORAL_TABLET | Freq: Two times a day (BID) | ORAL | Status: DC
  Administered 2019-11-25 – 2019-11-27 (×5): 200 mg via ORAL
  Filled 2019-11-25 (×6): qty 1

## 2019-11-25 MED ORDER — DIFICID 200 MG PO TABS: 200.0000 mg | ORAL_TABLET | Freq: Two times a day (BID) | ORAL | 0 refills | Status: DC

## 2019-11-25 MED ORDER — MAGNESIUM SULFATE 2 GM/50ML IV SOLN
2.0000 g | Freq: Once | INTRAVENOUS | Status: AC
  Administered 2019-11-25: 2 g via INTRAVENOUS
  Filled 2019-11-25: qty 50

## 2019-11-25 MED ORDER — CALCIUM CARBONATE ANTACID 500 MG PO CHEW
1.0000 | CHEWABLE_TABLET | Freq: Once | ORAL | Status: AC
  Administered 2019-11-25: 200 mg via ORAL
  Filled 2019-11-25: qty 1

## 2019-11-25 MED ORDER — POTASSIUM CHLORIDE CRYS ER 20 MEQ PO TBCR
40.0000 meq | EXTENDED_RELEASE_TABLET | Freq: Two times a day (BID) | ORAL | Status: AC
  Administered 2019-11-25 (×2): 40 meq via ORAL
  Filled 2019-11-25 (×2): qty 2

## 2019-11-25 NOTE — Progress Notes (Signed)
[place] for Heparin Indication: atrial fibrillation  Allergies  Allergen Reactions  . Ace Inhibitors     cough  . Amoxicillin Itching    REACTION: unknown? pain in right kidney  . Benicar Hct [Olmesartan Medoxomil-Hctz]     Extreme weakness  . Budesonide-Formoterol Fumarate     Causes tremors and numbness  . Chlorzoxazone [Chlorzoxazone]   . Citalopram Hydrobromide     REACTION: hives  . Citalopram Hydrobromide   . Clonidine Derivatives   . Cymbalta [Duloxetine Hcl]   . Droperidol     REACTION: hives  . Flexeril [Cyclobenzaprine Hcl]   . Fluconazole Nausea And Vomiting and Other (See Comments)    fatigue  . Gabapentin Itching  . Ketorolac Tromethamine     REACTION: hives  . Ketorolac Tromethamine   . Metoclopramide Hcl   . Mometasone Furo-Formoterol Fum     Causes sore throat, blurred vision and unable to sleep--started on med 09-17-10  . Morphine     REACTION: hives and itching  . Olmesartan Medoxomil     REACTION: fatique  . Breo Ellipta [Fluticasone Furoate-Vilanterol] Itching    Pt reports itching with Memory Dance is related to being lactose intolerant.     Patient Measurements: Height: 5' 3"  (160 cm) Weight: 68 kg (150 lb) IBW/kg (Calculated) : 52.4 HEPARIN DW (KG): 66.3  Vital Signs: Temp: 98.2 F (36.8 C) (07/15 0526) Temp Source: Oral (07/15 0526) BP: 116/57 (07/15 0825) Pulse Rate: 66 (07/15 0825)  Labs: Recent Labs    11/22/19 1433 11/22/19 1624 11/22/19 2227 11/23/19 0649 11/23/19 0649 11/23/19 1622 11/24/19 0454 11/25/19 0502  HGB  --   --   --  12.0   < >  --  11.3* 10.7*  HCT  --   --   --  36.0  --   --  33.7* 32.7*  PLT  --   --   --  315  --   --  294 302  HEPARINUNFRC  --   --    < > 0.33   < > 0.33 0.31 0.40  CREATININE  --   --   --  0.61  --   --  0.63 0.62  TROPONINIHS 8 7  --   --   --   --   --   --    < > = values in this interval not displayed.    Estimated Creatinine Clearance: 51 mL/min  (by C-G formula based on SCr of 0.62 mg/dL).   Medical History: Past Medical History:  Diagnosis Date  . ALLERGIC RHINITIS   . Anxiety   . Asthma   . Blood type O+   . Cervical strain, acute   . Cirrhosis ([place])   . Colon polyp 2010   adenoma  . Diverticulosis of colon   . Dizziness   . Esophageal varices ([place])   . Fibromyalgia   . GERD (gastroesophageal reflux disease)   . History of nephrolithiasis   . Hypercholesterolemia    borderline  . Hypertension   . IBS (irritable bowel syndrome)   . Osteoarthritis   . Personal history of allergy to unspecified medicinal agent   . Postconcussion syndrome   . Vitamin D deficiency     Medications:  Scheduled:  . amLODipine  2.5 mg Oral Daily  . azithromycin  500 mg Oral Daily  . losartan  25 mg Oral Daily  . mometasone-formoterol  2 puff Inhalation BID  . potassium chloride  40 mEq Oral BID  . propranolol  10 mg Oral BID  . saccharomyces boulardii  250 mg Oral BID  . vancomycin  125 mg Oral QID   Followed by  . [START ON 12/04/2019] vancomycin  125 mg Oral BID   Followed by  . [START ON 12/12/2019] vancomycin  125 mg Oral Daily   Followed by  . [START ON 12/19/2019] vancomycin  125 mg Oral QODAY   Followed by  . [START ON 12/27/2019] vancomycin  125 mg Oral Q3 days    Assessment: 79 yo F admitted with C. diff colitis who developed afib in the setting of acute illness. PMH significant for NASH liver cirrhosis, PLTC >200, LFTs wnl.  Pt was on Lovenox for VTE ppx, last dose given 7/11 pm. Started heparin infusion at 900 units/hr, 8-hour heparin level resulted at 0.12. 1000 unit bolus was given and heparin drip was increased by 3 units/kg/hr to 1100 units/hr. 8-hour heparin level was 0.33 but was drawn an hour early. Heparin was increased to 1150 units/hr and the 8-hour level and level with AM labs 7/14 was therapeutic at 0.33 and 0.31, respectively. Heparin level with AM labs on 7/15 was therapeutic at 0.40  Will target lower level  of rnage due to esophageal varices. Hgb downtrending but stable at this time.   Goal of Therapy:  Heparin level 0.3-0.7 units/ml Monitor platelets by anticoagulation protocol: Yes   Plan:  Continue heparin drip at 1150 units/hr Obtain a heparin level with AM labs Monitor H&H and platelets  Cephus Slater, PharmD, [facility] Pharmacy Resident [phone] 11/25/2019 9:18 AM  Please check [email or web address] for unit-specific pharmacy phone numbers.

## 2019-11-25 NOTE — Consult Note (Signed)
[name] for Infectious Disease       Reason for Consult: C diff colitis, non-resolving  Referring Physician: Dr. [place] Pink  Active Problems:   Essential hypertension   Esophageal varices without bleeding ([place])   Fever   Acute lower UTI   Diarrhea   C. difficile colitis   Other cirrhosis of liver ([place])   New onset atrial fibrillation ([place])   . amLODipine  2.5 mg Oral Daily  . azithromycin  500 mg Oral Daily  . fidaxomicin  200 mg Oral BID  . losartan  25 mg Oral Daily  . mometasone-formoterol  2 puff Inhalation BID  . potassium chloride  40 mEq Oral BID  . propranolol  10 mg Oral BID  . saccharomyces boulardii  250 mg Oral BID    Recommendations: Will change to dificid Azithromycin for 3 days total   Assessment: She has diarrhea with fever and continues to have loose stools though she is not sure exactly how many but has had 5 episodes this am.  I think the C diff is the main driver of her symptoms.  Risk factors are Nexium.  Also her diarrhea can be exacerbated by the PO potassium.  Magnesium pills have been stopped.  No currently taking calcium or Tums.    Antibiotics: Oral vancomycin azithromycin  HPI: [name] is a 79 y.o. female with cirrhosis from fatty liver, HTN who had ongoing diarrhea associated with fever and weakness for about 1 week.  Testing noted C diff Ag and toxin positive.  Also with Campylobacter. She had one fever on admission but has otherwise remained afebrile.  Mild leukocytosis today.  No abdominal bloating.  Diarrhea has been about the same consistency and frequency since admission.  No recent antibiotics prior to admission.    Review of Systems:  Constitutional: negative for fevers and chills Gastrointestinal: positive for diarrhea, negative for nausea Genitourinary: negative for frequency and dysuria All other systems reviewed and are negative    Past Medical History:  Diagnosis Date  . ALLERGIC RHINITIS   . Anxiety     . Asthma   . Blood type O+   . Cervical strain, acute   . Cirrhosis ([place])   . Colon polyp 2010   adenoma  . Diverticulosis of colon   . Dizziness   . Esophageal varices ([place])   . Fibromyalgia   . GERD (gastroesophageal reflux disease)   . History of nephrolithiasis   . Hypercholesterolemia    borderline  . Hypertension   . IBS (irritable bowel syndrome)   . Osteoarthritis   . Personal history of allergy to unspecified medicinal agent   . Postconcussion syndrome   . Vitamin D deficiency     Social History   Tobacco Use  . Smoking status: Former Smoker    Packs/day: 2.00    Years: 8.00    Pack years: 16.00    Types: Cigarettes    Quit date: 05/13/1962    Years since quitting: 57.5  . Smokeless tobacco: Never Used  Vaping Use  . Vaping Use: Never used  Substance Use Topics  . Alcohol use: No  . Drug use: No    Family History  Problem Relation Age of Onset  . Breast cancer Mother   . Alzheimer's disease Mother   . Heart disease Mother   . COPD Brother   . Heart disease Sister   . Breast cancer Sister 40  . Alcohol abuse Sister   . Ovarian cancer  Paternal Grandmother   . Arthritis Other        Cousins   . COPD Cousin        Maternal side   . Heart attack Maternal Uncle     Allergies  Allergen Reactions  . Ace Inhibitors     cough  . Amoxicillin Itching    REACTION: unknown? pain in right kidney  . Benicar Hct [Olmesartan Medoxomil-Hctz]     Extreme weakness  . Budesonide-Formoterol Fumarate     Causes tremors and numbness  . Chlorzoxazone [Chlorzoxazone]   . Citalopram Hydrobromide     REACTION: hives  . Citalopram Hydrobromide   . Clonidine Derivatives   . Cymbalta [Duloxetine Hcl]   . Droperidol     REACTION: hives  . Flexeril [Cyclobenzaprine Hcl]   . Fluconazole Nausea And Vomiting and Other (See Comments)    fatigue  . Gabapentin Itching  . Ketorolac Tromethamine     REACTION: hives  . Ketorolac Tromethamine   . Metoclopramide Hcl   .  Mometasone Furo-Formoterol Fum     Causes sore throat, blurred vision and unable to sleep--started on med 09-17-10  . Morphine     REACTION: hives and itching  . Olmesartan Medoxomil     REACTION: fatique  . Breo Ellipta [Fluticasone Furoate-Vilanterol] Itching    Pt reports itching with Memory Dance is related to being lactose intolerant.     Physical Exam: Constitutional: in no apparent distress  Vitals:   11/25/19 0526 11/25/19 0825  BP: (!) 133/55 (!) 116/57  Pulse: 64 66  Resp:    Temp: 98.2 F (36.8 C)   SpO2: 96%    EYES: anicteric Cardiovascular: Cor RRR Respiratory: clear; GI: soft, nt Skin: negatives: no rash  Lab Results  Component Value Date   WBC 11.1 (H) 11/25/2019   HGB 10.7 (L) 11/25/2019   HCT 32.7 (L) 11/25/2019   MCV 91.1 11/25/2019   PLT 302 11/25/2019    Lab Results  Component Value Date   CREATININE 0.62 11/25/2019   BUN 9 11/25/2019   NA 137 11/25/2019   K 3.3 (L) 11/25/2019   CL 107 11/25/2019   CO2 20 (L) 11/25/2019    Lab Results  Component Value Date   ALT 16 11/25/2019   AST 23 11/25/2019   ALKPHOS 52 11/25/2019     Microbiology: Recent Results (from the past 240 hour(s))  Urine culture     Status: None   Collection Time: 11/19/19 12:20 PM   Specimen: Urine, Random  Result Value Ref Range Status   Specimen Description URINE, RANDOM  Final   Special Requests NONE  Final   Culture   Final    NO GROWTH Performed at [facility], [address], [place], [place] [address]    Report Status 11/20/2019 FINAL  Final  SARS Coronavirus 2 by RT PCR (hospital order, performed in [facility] hospital lab) Nasopharyngeal Nasopharyngeal Swab     Status: None   Collection Time: 11/19/19 12:20 PM   Specimen: Nasopharyngeal Swab  Result Value Ref Range Status   SARS Coronavirus 2 NEGATIVE NEGATIVE Final    Comment: (NOTE) SARS-CoV-2 target nucleic acids are NOT DETECTED.  The SARS-CoV-2 RNA is generally detectable in upper and  lower respiratory specimens during the acute phase of infection. The lowest concentration of SARS-CoV-2 viral copies this assay can detect is 250 copies / mL. A negative result does not preclude SARS-CoV-2 infection and should not be used as the sole basis  for treatment or other patient management decisions.  A negative result may occur with improper specimen collection / handling, submission of specimen other than nasopharyngeal swab, presence of viral mutation(s) within the areas targeted by this assay, and inadequate number of viral copies (<250 copies / mL). A negative result must be combined with clinical observations, patient history, and epidemiological information.  Fact Sheet for Patients:   StrictlyIdeas.no  Fact Sheet for Healthcare Providers: BankingDealers.co.za  This test is not yet approved or  cleared by the [place] FDA and has been authorized for detection and/or diagnosis of SARS-CoV-2 by FDA under an Emergency Use Authorization (EUA).  This EUA will remain in effect (meaning this test can be used) for the duration of the COVID-19 declaration under Section 564(b)(1) of the Act, 21 U.S.C. section 360bbb-3(b)(1), unless the authorization is terminated or revoked sooner.  Performed at [facility], [place] [address], [place], [place] [address]   Culture, blood (routine x 2)     Status: None   Collection Time: 11/19/19  8:00 PM   Specimen: BLOOD LEFT HAND  Result Value Ref Range Status   Specimen Description BLOOD LEFT HAND  Final   Special Requests   Final    BOTTLES DRAWN AEROBIC AND ANAEROBIC Blood Culture adequate volume   Culture   Final    NO GROWTH 5 DAYS Performed at [facility], [place] [address], [place], [place] [address]    Report Status 11/24/2019 FINAL  Final  Culture, blood (routine x 2)     Status: None   Collection Time: 11/19/19  9:00 PM   Specimen: BLOOD RIGHT HAND  Result Value Ref  Range Status   Specimen Description BLOOD RIGHT HAND  Final   Special Requests   Final    BOTTLES DRAWN AEROBIC AND ANAEROBIC Blood Culture adequate volume   Culture   Final    NO GROWTH 5 DAYS Performed at [facility], [place] [address] [place], [place] [address]    Report Status 11/24/2019 FINAL  Final  Gastrointestinal Panel by PCR , Stool     Status: Abnormal   Collection Time: 11/20/19  3:56 PM   Specimen: STOOL  Result Value Ref Range Status   Campylobacter species DETECTED (A) NOT DETECTED Final    Comment: RESULT CALLED TO, READ BACK BY AND VERIFIED WITH: MELON BONADO @2011  11/20/19 MJU    Plesimonas shigelloides NOT DETECTED NOT DETECTED Final   Salmonella species NOT DETECTED NOT DETECTED Final   Yersinia enterocolitica NOT DETECTED NOT DETECTED Final   Vibrio species NOT DETECTED NOT DETECTED Final   Vibrio cholerae NOT DETECTED NOT DETECTED Final   Enteroaggregative E coli (EAEC) NOT DETECTED NOT DETECTED Final   Enteropathogenic E coli (EPEC) NOT DETECTED NOT DETECTED Final   Enterotoxigenic E coli (ETEC) NOT DETECTED NOT DETECTED Final   Shiga like toxin producing E coli (STEC) NOT DETECTED NOT DETECTED Final   Shigella/Enteroinvasive E coli (EIEC) NOT DETECTED NOT DETECTED Final   Cryptosporidium NOT DETECTED NOT DETECTED Final   Cyclospora cayetanensis NOT DETECTED NOT DETECTED Final   Entamoeba histolytica NOT DETECTED NOT DETECTED Final   Giardia lamblia NOT DETECTED NOT DETECTED Final   Adenovirus F40/41 NOT DETECTED NOT DETECTED Final   Astrovirus NOT DETECTED NOT DETECTED Final   Norovirus GI/GII NOT DETECTED NOT DETECTED Final   Rotavirus A NOT DETECTED NOT DETECTED Final   Sapovirus (I, II, IV, and V) NOT DETECTED NOT DETECTED Final  Comment: Performed at [facility], [place], [place] [address]  C Difficile Quick Screen w PCR reflex     Status: Abnormal   Collection Time: 11/20/19  3:56 PM   Specimen: STOOL  Result Value  Ref Range Status   C Diff antigen POSITIVE (A) NEGATIVE Final   C Diff toxin POSITIVE (A) NEGATIVE Final   C Diff interpretation Toxin producing C. difficile detected.  Final    Comment: CRITICAL RESULT CALLED TO, READ BACK BY AND VERIFIED WITH: RN ALA ABU AT 1724 11/20/19 BY MM Performed at [facility], [address], [place], [place] [address]     Vicky Schleich W Savaya Hakes, [place] for Infectious Disease [facility] [email or web address] 11/25/2019, 10:23 AM

## 2019-11-25 NOTE — Progress Notes (Addendum)
PROGRESS NOTE    [name]  WGY:[id number] DOB: 27-Oct-1940 DOA: 11/19/2019 PCP: Lauree Chandler, NP     Brief Narrative:  79 y.o. WF lives in [facility] PMHx Fibromyalgia, cirrhosis of the liver secondary to [place], esophageal varices, HTN, HLD,, asthma, irritable bowel syndrome, presents to the ER because of ongoing weakness for the last 1 week where patient was feeling intensely fatigued.  Patient over the last 1 week he has been having increased frequency of urination and diarrhea.  Diarrhea is watery.  Denies any chest pain or shortness of breath.  Denies any loss of consciousness.  Given the worsening symptoms patient presents to the ER.  ED Course: In the ER patient is found to be febrile with a temperature 102 F and lab work show sodium 129 WBC 11.9 lactic acid was normal EKG shows normal sinus rhythm.  Chest x-ray was unremarkable UA is going which is concerning for UTI.  Given the symptoms patient had blood cultures drawn started on fluids empiric antibiotics admitted for further management.  Subjective: Patient states that her nausea is better but her diarrhea persists.  She had more than 8 bowel movements over the last 24 hours.  Denies any abdominal discomfort.  No chest pain or shortness of breath.      Assessment & Plan:  C. difficile colitis  C. difficile test was positive.  Patient was started on oral vancomycin on July 10.  GI pathogen panel was positive for Campylobacter.  Since there was no improvement in her diarrhea and she was started on azithromycin yesterday.  She has not shown any improvement in her diarrhea.  She apparently had some improvement after the first 24 hours of treatment but then she got worse again.  Her abdomen remains benign.  No indication for imaging studies currently but we will request ID to reevaluate the patient.  WBC noted to be slightly higher today at 11.1.  Renal function is normal.    ADDENDUM: Seen by Dr. Linus Salmons.  Vancomycin changed  over to fidaxomicin.  New onset atrial fibrillation Patient noted to have atrial fibrillation while she was here in the hospital.  Apparently no similar history in the past.  TSH was normal.  Echocardiogram has been done as well.  Shows normal systolic function.  Some left atrial dilatation is noted.  No significant valvular disease.  Heart rate seems to be stable.  Patient on propranolol which will be continued for now.  Patient started on heparin infusion for stroke prevention.  Will need to be switched to oral agent when she is improving.  Chads 2 vascular score is 4.  Hyponatremia Likely due to hypovolemia.  Now resolved.    Hypokalemia/hypomagnesemia Likely due to GI loss.  Potassium noted to be 3.3 today.  Will be repleted.  Magnesium 1.7.  Will be given magnesium sulfate as well.  Questionable ascites, ruled out -US abdomen negative for ascites.  History of liver cirrhosis secondary to NASH Seems to be stable.  Continue to monitor.    Essential HTN  Blood pressure is reasonably well controlled.  She is on amlodipine losartan and propranolol.  Asthma -Currently not an issue  DVT prophylaxis: Heparin drip  Code Status: DNR Family Communication: Discussed with the patient Status is: Inpatient  Remains inpatient appropriate because:Persistent severe electrolyte disturbances and IV treatments appropriate due to intensity of illness or inability to take PO   Dispo:  Patient From: Home  Planned Disposition: Home  Expected discharge date: 11/27/19  Medically stable for discharge: No       Consultants: Infectious disease   Procedures/Significant Events:  7/9 CXR; negative cardiopulmonary disease 7/10 [place] ascites (abdomen); negative for ascites   Cultures 7/10 positive C. difficile antigen, positive C. difficile toxin   Antimicrobials: Anti-infectives (From admission, onward)   Start     Ordered Stop   12/27/19 1000  vancomycin (VANCOCIN) 50 mg/mL oral solution  125 mg     Discontinue    "Followed by" Linked Group Details   11/20/19 1805 01/05/20 0959   12/19/19 1000  vancomycin (VANCOCIN) 50 mg/mL oral solution 125 mg     Discontinue    "Followed by" Linked Group Details   11/20/19 1805 12/27/19 0959   12/12/19 1000  vancomycin (VANCOCIN) 50 mg/mL oral solution 125 mg     Discontinue    "Followed by" Linked Group Details   11/20/19 1805 12/19/19 0959   12/04/19 2200  vancomycin (VANCOCIN) 50 mg/mL oral solution 125 mg     Discontinue    "Followed by" Linked Group Details   11/20/19 1805 12/11/19 2159   11/20/19 2000  vancomycin (VANCOCIN) 50 mg/mL oral solution 125 mg     Discontinue    "Followed by" Linked Group Details   11/20/19 1805 12/04/19 2159   11/20/19 1000  vancomycin (VANCOREADY) IVPB 500 mg/100 mL  Status:  Discontinued        11/19/19 2029 11/20/19 1805   11/19/19 2030  vancomycin (VANCOREADY) IVPB 1250 mg/250 mL        11/19/19 2028 11/19/19 2310   11/19/19 1545  cefTRIAXone (ROCEPHIN) 1 g in sodium chloride 0.9 % 100 mL IVPB        11/19/19 1543 11/19/19 1956   11/19/19 0000  cephALEXin (KEFLEX) 500 MG capsule     Discontinue     11/19/19 1754 11/24/19 2359      Continuous Infusions: . sodium chloride 50 mL/hr at 11/25/19 0715  . heparin 1,150 Units/hr (11/25/19 0705)     Objective: Vitals:   11/24/19 2141 11/25/19 0019 11/25/19 0526 11/25/19 0825  BP: (!) 117/56 (!) 116/58 (!) 133/55 (!) 116/57  Pulse: 65 64 64 66  Resp:  18    Temp: 98.2 F (36.8 C) 98 F (36.7 C) 98.2 F (36.8 C)   TempSrc: Oral Oral Oral   SpO2: 99% 98% 96%   Weight:      Height:        Intake/Output Summary (Last 24 hours) at 11/25/2019 1034 Last data filed at 11/25/2019 0524 Gross per 24 hour  Intake 125.31 ml  Output 1 ml  Net 124.31 ml   Filed Weights   11/19/19 1106  Weight: 68 kg    Physical Exam:  General appearance: Awake alert.  In no distress Resp: Clear to auscultation bilaterally.  Normal effort Cardio: S1-S2 is  normal regular.  No S3-S4.  No rubs murmurs or bruit GI: Abdomen is soft.  Mildly tender in the lower right abdomen without any rebound rigidity or guarding.  No masses organomegaly.   Extremities: No edema.  Full range of motion of lower extremities. Neurologic: Alert and oriented x3.  No focal neurological deficits.      Lab Data Reviewed:   CBC: Recent Labs  Lab 11/21/19 0237 11/22/19 0334 11/23/19 0649 11/24/19 0454 11/25/19 0502  WBC 9.0 8.0 8.0 9.8 11.1*  NEUTROABS 4.5 2.9 3.5 4.9 5.0  HGB 10.8* 10.7* 12.0 11.3* 10.7*  HCT 30.9* 31.7* 36.0 33.7* 32.7*  MCV  87.8 88.1 89.3 90.6 91.1  PLT 210 222 315 294 175   Basic Metabolic Panel: Recent Labs  Lab 11/21/19 0237 11/21/19 0237 11/21/19 1755 11/22/19 0334 11/23/19 0649 11/24/19 0454 11/25/19 0502  NA 131*   < > 135 138 138 138 137  K 2.7*   < > 3.9 3.9 3.6 4.9 3.3*  CL 102   < > 105 106 104 105 107  CO2 21*   < > 22 24 23 23  20*  GLUCOSE 108*   < > 158* 102* 112* 106* 92  BUN 7*   < > 6* <5* <5* 8 9  CREATININE 0.54   < > 0.68 0.72 0.61 0.63 0.62  CALCIUM 8.4*   < > 8.7* 8.6* 9.3 9.2 9.0  MG 1.7   < > 1.7 1.7 1.6* 2.0 1.7  PHOS 2.4*  --   --  2.8 3.3 3.0 4.4   < > = values in this interval not displayed.   GFR: Estimated Creatinine Clearance: 51 mL/min (by C-G formula based on SCr of 0.62 mg/dL). Liver Function Tests: Recent Labs  Lab 11/21/19 0237 11/22/19 0334 11/23/19 0649 11/24/19 0454 11/25/19 0502  AST 23 23 26 23 23   ALT 14 16 18 18 16   ALKPHOS 44 62 56 58 52  BILITOT 0.5 0.6 0.9 0.4 0.8  PROT 5.0* 5.4* 6.2* 6.1* 5.5*  ALBUMIN 2.7* 2.9* 3.3* 3.1* 2.9*   Recent Labs  Lab 11/19/19 1121  LIPASE 35   Cardiac Enzymes: Recent Labs  Lab 11/20/19 0555  CKTOTAL 57   Thyroid Function Tests: Recent Labs    11/22/19 1433  TSH 1.666   Sepsis Labs: Recent Labs  Lab 11/19/19 1121 11/19/19 2005  LATICACIDVEN 1.2 1.1    Recent Results (from the past 240 hour(s))  Urine culture      Status: None   Collection Time: 11/19/19 12:20 PM   Specimen: Urine, Random  Result Value Ref Range Status   Specimen Description URINE, RANDOM  Final   Special Requests NONE  Final   Culture   Final    NO GROWTH Performed at [facility], [place] [address] [place], [place] [address]    Report Status 11/20/2019 FINAL  Final  SARS Coronavirus 2 by RT PCR (hospital order, performed in [facility] hospital lab) Nasopharyngeal Nasopharyngeal Swab     Status: None   Collection Time: 11/19/19 12:20 PM   Specimen: Nasopharyngeal Swab  Result Value Ref Range Status   SARS Coronavirus 2 NEGATIVE NEGATIVE Final    Comment: (NOTE) SARS-CoV-2 target nucleic acids are NOT DETECTED.  The SARS-CoV-2 RNA is generally detectable in upper and lower respiratory specimens during the acute phase of infection. The lowest concentration of SARS-CoV-2 viral copies this assay can detect is 250 copies / mL. A negative result does not preclude SARS-CoV-2 infection and should not be used as the sole basis for treatment or other patient management decisions.  A negative result may occur with improper specimen collection / handling, submission of specimen other than nasopharyngeal swab, presence of viral mutation(s) within the areas targeted by this assay, and inadequate number of viral copies (<250 copies / mL). A negative result must be combined with clinical observations, patient history, and epidemiological information.  Fact Sheet for Patients:   StrictlyIdeas.no  Fact Sheet for Healthcare Providers: BankingDealers.co.za  This test is not yet approved or  cleared by the [place] FDA and has been authorized for detection and/or diagnosis of SARS-CoV-2 by  FDA under an Emergency Use Authorization (EUA).  This EUA will remain in effect (meaning this test can be used) for the duration of the COVID-19 declaration under Section 564(b)(1) of the Act, 21  U.S.C. section 360bbb-3(b)(1), unless the authorization is terminated or revoked sooner.  Performed at [facility], [place] [address] [place], [place] [address]   Culture, blood (routine x 2)     Status: None   Collection Time: 11/19/19  8:00 PM   Specimen: BLOOD LEFT HAND  Result Value Ref Range Status   Specimen Description BLOOD LEFT HAND  Final   Special Requests   Final    BOTTLES DRAWN AEROBIC AND ANAEROBIC Blood Culture adequate volume   Culture   Final    NO GROWTH 5 DAYS Performed at [facility], [place] [address] [place], [place] [address]    Report Status 11/24/2019 FINAL  Final  Culture, blood (routine x 2)     Status: None   Collection Time: 11/19/19  9:00 PM   Specimen: BLOOD RIGHT HAND  Result Value Ref Range Status   Specimen Description BLOOD RIGHT HAND  Final   Special Requests   Final    BOTTLES DRAWN AEROBIC AND ANAEROBIC Blood Culture adequate volume   Culture   Final    NO GROWTH 5 DAYS Performed at [facility], [place] [address] [place], [place] [address]    Report Status 11/24/2019 FINAL  Final  Gastrointestinal Panel by PCR , Stool     Status: Abnormal   Collection Time: 11/20/19  3:56 PM   Specimen: STOOL  Result Value Ref Range Status   Campylobacter species DETECTED (A) NOT DETECTED Final    Comment: RESULT CALLED TO, READ BACK BY AND VERIFIED WITH: MELON BONADO @2011  11/20/19 MJU    Plesimonas shigelloides NOT DETECTED NOT DETECTED Final   Salmonella species NOT DETECTED NOT DETECTED Final   Yersinia enterocolitica NOT DETECTED NOT DETECTED Final   Vibrio species NOT DETECTED NOT DETECTED Final   Vibrio cholerae NOT DETECTED NOT DETECTED Final   Enteroaggregative E coli (EAEC) NOT DETECTED NOT DETECTED Final   Enteropathogenic E coli (EPEC) NOT DETECTED NOT DETECTED Final   Enterotoxigenic E coli (ETEC) NOT DETECTED NOT DETECTED Final   Shiga like toxin producing E coli (STEC) NOT DETECTED NOT DETECTED Final    Shigella/Enteroinvasive E coli (EIEC) NOT DETECTED NOT DETECTED Final   Cryptosporidium NOT DETECTED NOT DETECTED Final   Cyclospora cayetanensis NOT DETECTED NOT DETECTED Final   Entamoeba histolytica NOT DETECTED NOT DETECTED Final   Giardia lamblia NOT DETECTED NOT DETECTED Final   Adenovirus F40/41 NOT DETECTED NOT DETECTED Final   Astrovirus NOT DETECTED NOT DETECTED Final   Norovirus GI/GII NOT DETECTED NOT DETECTED Final   Rotavirus A NOT DETECTED NOT DETECTED Final   Sapovirus (I, II, IV, and V) NOT DETECTED NOT DETECTED Final    Comment: Performed at [facility], [place] [place], [place] [address]  C Difficile Quick Screen w PCR reflex     Status: Abnormal   Collection Time: 11/20/19  3:56 PM   Specimen: STOOL  Result Value Ref Range Status   C Diff antigen POSITIVE (A) NEGATIVE Final   C Diff toxin POSITIVE (A) NEGATIVE Final   C Diff interpretation Toxin producing C. difficile detected.  Final    Comment: CRITICAL RESULT CALLED TO, READ BACK BY AND VERIFIED WITH: RN ALA ABU AT 1724 11/20/19 BY MM Performed at [facility], [place] [address], [place], [place] [address]          Radiology Studies: ECHOCARDIOGRAM COMPLETE  Result Date: 11/23/2019    ECHOCARDIOGRAM REPORT   Patient Name:   [name] [facility] Date of Exam: 11/23/2019 Medical Rec #:  [id number]        Height:       63.0 in Accession #:    [id number]       Weight:       150.0 lb Date of Birth:  Oct 06, 1940         BSA:          77.711 m Patient Age:    59 years         BP:           126/56 mmHg Patient Gender: F                HR:           65 bpm. Exam Location:  Inpatient Procedure: 2D Echo Indications:    Atrial fibrillation 427.31  History:        Patient has no prior history of Echocardiogram examinations.                 Cirrhosis. C.diff, Arrythmias:Atrial Fibrillation,                 Signs/Symptoms:Fever; Risk Factors:Hypertension and                 Dyslipidemia.  Sonographer:    Johny Chess Referring Phys: [id number] [place]  1. Left ventricular ejection fraction, by estimation, is 60 to 65%. The left ventricle has normal function. The left ventricle has no regional wall motion abnormalities. Left ventricular diastolic parameters are indeterminate.  2. Right ventricular systolic function is normal. The right ventricular size is normal. There is mildly elevated pulmonary artery systolic pressure.  3. Left atrial size was mild to moderately dilated.  4. The mitral valve is normal in structure. Trivial mitral valve regurgitation. No evidence of mitral stenosis.  5. The aortic valve is tricuspid. Aortic valve regurgitation is not visualized. No aortic stenosis is present.  6. The inferior vena cava is normal in size with <50% respiratory variability, suggesting right atrial pressure of 8 mmHg. Comparison(s): No prior Echocardiogram. Conclusion(s)/Recommendation(s): Normal biventricular function without evidence of hemodynamically significant valvular heart disease. FINDINGS  Left Ventricle: Left ventricular ejection fraction, by estimation, is 60 to 65%. The left ventricle has normal function. The left ventricle has no regional wall motion abnormalities. The left ventricular internal cavity size was normal in size. There is  no left ventricular hypertrophy. Left ventricular diastolic parameters are indeterminate. Right Ventricle: The right ventricular size is normal. No increase in right ventricular wall thickness. Right ventricular systolic function is normal. There is mildly elevated pulmonary artery systolic pressure. The tricuspid regurgitant velocity is 2.89  m/s, and with an assumed right atrial pressure of 8 mmHg, the estimated right ventricular systolic pressure is 75.1 mmHg. Left Atrium: Left atrial size was mild to moderately dilated. Right Atrium: Right atrial size was normal in size. Pericardium: There is no evidence of pericardial effusion. Mitral Valve: The mitral  valve is normal in structure. Mild mitral annular calcification. Trivial mitral valve regurgitation. No evidence of mitral valve stenosis. Tricuspid Valve: The tricuspid valve is normal in structure. Tricuspid valve regurgitation is mild. Aortic Valve: The aortic valve is tricuspid. Aortic valve regurgitation is not visualized. No aortic  stenosis is present. Pulmonic Valve: The pulmonic valve was grossly normal. Pulmonic valve regurgitation is trivial. No evidence of pulmonic stenosis. Aorta: The aortic root, ascending aorta and aortic arch are all structurally normal, with no evidence of dilitation or obstruction. Venous: The inferior vena cava is normal in size with less than 50% respiratory variability, suggesting right atrial pressure of 8 mmHg. IAS/Shunts: The atrial septum is grossly normal.  LEFT VENTRICLE PLAX 2D LVIDd:         4.60 cm  Diastology LVIDs:         2.60 cm  LV e' lateral:   9.57 cm/s LV PW:         0.90 cm  LV E/e' lateral: 10.9 LV IVS:        0.80 cm  LV e' medial:    8.49 cm/s LVOT diam:     1.90 cm  LV E/e' medial:  12.2 LV SV:         65 LV SV Index:   38 LVOT Area:     2.84 cm  RIGHT VENTRICLE             IVC RV S prime:     11.30 cm/s  IVC diam: 2.00 cm TAPSE (M-mode): 2.4 cm LEFT ATRIUM             Index       RIGHT ATRIUM           Index LA diam:        3.90 cm 2.28 cm/m  RA Area:     14.20 cm LA Vol (A2C):   63.0 ml 36.82 ml/m RA Volume:   35.00 ml  20.45 ml/m LA Vol (A4C):   63.8 ml 37.29 ml/m LA Biplane Vol: 65.5 ml 38.28 ml/m  AORTIC VALVE LVOT Vmax:   105.00 cm/s LVOT Vmean:  66.900 cm/s LVOT VTI:    0.230 m  AORTA Ao Root diam: 3.20 cm Ao Asc diam:  3.30 cm MITRAL VALVE                TRICUSPID VALVE MV Area (PHT): 3.08 cm     TV Peak grad:   33.9 mmHg MV Decel Time: 246 msec     TV Vmax:        2.91 m/s MV E velocity: 104.00 cm/s  TR Peak grad:   33.4 mmHg MV A velocity: 50.10 cm/s   TR Vmax:        289.00 cm/s MV E/A ratio:  2.08                             SHUNTS                              Systemic VTI:  0.23 m                             Systemic Diam: 1.90 cm Buford Dresser MD Electronically signed by Buford Dresser MD Signature Date/Time: 11/23/2019/5:49:20 PM    Final         Scheduled Meds: . amLODipine  2.5 mg Oral Daily  . azithromycin  500 mg Oral Daily  . fidaxomicin  200 mg Oral BID  . losartan  25 mg Oral Daily  . mometasone-formoterol  2 puff Inhalation BID  . potassium chloride  40 mEq  Oral BID  . propranolol  10 mg Oral BID  . saccharomyces boulardii  250 mg Oral BID   Continuous Infusions: . sodium chloride 50 mL/hr at 11/25/19 0715  . heparin 1,150 Units/hr (11/25/19 0705)     LOS: 5 days    Bonnielee Haff, MD [facility]   If 7PM-7AM, please contact night-coverage [email or web address] Password [facility] 11/25/2019, 10:34 AM

## 2019-11-26 LAB — PHOSPHORUS: Phosphorus: 4.1 mg/dL (ref 2.5–4.6)

## 2019-11-26 LAB — CBC WITH DIFFERENTIAL/PLATELET
Abs Immature Granulocytes: 0.28 10*3/uL — ABNORMAL HIGH (ref 0.00–0.07)
Basophils Absolute: 0.1 10*3/uL (ref 0.0–0.1)
Basophils Relative: 1 %
Eosinophils Absolute: 0.2 10*3/uL (ref 0.0–0.5)
Eosinophils Relative: 2 %
HCT: 34 % — ABNORMAL LOW (ref 36.0–46.0)
Hemoglobin: 11.3 g/dL — ABNORMAL LOW (ref 12.0–15.0)
Immature Granulocytes: 2 %
Lymphocytes Relative: 37 %
Lymphs Abs: 4.4 10*3/uL — ABNORMAL HIGH (ref 0.7–4.0)
MCH: 30.2 pg (ref 26.0–34.0)
MCHC: 33.2 g/dL (ref 30.0–36.0)
MCV: 90.9 fL (ref 80.0–100.0)
Monocytes Absolute: 1.4 10*3/uL — ABNORMAL HIGH (ref 0.1–1.0)
Monocytes Relative: 11 %
Neutro Abs: 5.7 10*3/uL (ref 1.7–7.7)
Neutrophils Relative %: 47 %
Platelets: 365 10*3/uL (ref 150–400)
RBC: 3.74 MIL/uL — ABNORMAL LOW (ref 3.87–5.11)
RDW: 13.5 % (ref 11.5–15.5)
WBC: 12 10*3/uL — ABNORMAL HIGH (ref 4.0–10.5)
nRBC: 0 % (ref 0.0–0.2)

## 2019-11-26 LAB — BASIC METABOLIC PANEL
Anion gap: 9 (ref 5–15)
BUN: 8 mg/dL (ref 8–23)
CO2: 22 mmol/L (ref 22–32)
Calcium: 9.4 mg/dL (ref 8.9–10.3)
Chloride: 110 mmol/L (ref 98–111)
Creatinine, Ser: 0.62 mg/dL (ref 0.44–1.00)
GFR calc Af Amer: >60 mL/min (ref >60)
GFR calc non Af Amer: >60 mL/min (ref >60)
Glucose, Bld: 99 mg/dL (ref 70–99)
Potassium: 5.1 mmol/L (ref 3.5–5.1)
Sodium: 141 mmol/L (ref 135–145)

## 2019-11-26 LAB — MAGNESIUM: Magnesium: 1.9 mg/dL (ref 1.7–2.4)

## 2019-11-26 LAB — HEPARIN LEVEL (UNFRACTIONATED): Heparin Unfractionated: 0.36 IU/mL (ref 0.30–0.70)

## 2019-11-26 MED ORDER — APIXABAN 5 MG PO TABS
5.0000 mg | ORAL_TABLET | Freq: Two times a day (BID) | ORAL | Status: DC
  Administered 2019-11-26 – 2019-11-27 (×3): 5 mg via ORAL
  Filled 2019-11-26 (×3): qty 1

## 2019-11-26 NOTE — Progress Notes (Addendum)
[facility] for Heparin Indication: atrial fibrillation  Allergies  Allergen Reactions  . Ace Inhibitors     cough  . Amoxicillin Itching    REACTION: unknown? pain in right kidney  . Benicar Hct [Olmesartan Medoxomil-Hctz]     Extreme weakness  . Budesonide-Formoterol Fumarate     Causes tremors and numbness  . Chlorzoxazone [Chlorzoxazone]   . Citalopram Hydrobromide     REACTION: hives  . Citalopram Hydrobromide   . Clonidine Derivatives   . Cymbalta [Duloxetine Hcl]   . Droperidol     REACTION: hives  . Flexeril [Cyclobenzaprine Hcl]   . Fluconazole Nausea And Vomiting and Other (See Comments)    fatigue  . Gabapentin Itching  . Ketorolac Tromethamine     REACTION: hives  . Ketorolac Tromethamine   . Metoclopramide Hcl   . Mometasone Furo-Formoterol Fum     Causes sore throat, blurred vision and unable to sleep--started on med 09-17-10  . Morphine     REACTION: hives and itching  . Olmesartan Medoxomil     REACTION: fatique  . Breo Ellipta [Fluticasone Furoate-Vilanterol] Itching    Pt reports itching with [place] is related to being lactose intolerant.     Patient Measurements: Height: 5' 3"  (160 cm) Weight: 68 kg (150 lb) IBW/kg (Calculated) : 52.4 HEPARIN DW (KG): 66.3  Vital Signs: Temp: 98.5 F (36.9 C) (07/16 0617) Temp Source: Oral (07/16 0617) BP: 133/51 (07/16 0617) Pulse Rate: 65 (07/16 0617)  Labs: Recent Labs    11/24/19 0454 11/24/19 0454 11/25/19 0502 11/26/19 0433  HGB 11.3*   < > 10.7* 11.3*  HCT 33.7*  --  32.7* 34.0*  PLT 294  --  302 365  HEPARINUNFRC 0.31  --  0.40 0.36  CREATININE 0.63  --  0.62 0.62   < > = values in this interval not displayed.    Estimated Creatinine Clearance: 51 mL/min (by C-G formula based on SCr of 0.62 mg/dL).   Medical History: Past Medical History:  Diagnosis Date  . ALLERGIC RHINITIS   . Anxiety   . Asthma   . Blood type O+   . Cervical strain, acute   .  Cirrhosis ([place])   . Colon polyp 2010   adenoma  . Diverticulosis of colon   . Dizziness   . Esophageal varices ([place])   . Fibromyalgia   . GERD (gastroesophageal reflux disease)   . History of nephrolithiasis   . Hypercholesterolemia    borderline  . Hypertension   . IBS (irritable bowel syndrome)   . Osteoarthritis   . Personal history of allergy to unspecified medicinal agent   . Postconcussion syndrome   . Vitamin D deficiency     Medications:  Scheduled:  . amLODipine  2.5 mg Oral Daily  . azithromycin  500 mg Oral Daily  . fidaxomicin  200 mg Oral BID  . losartan  25 mg Oral Daily  . mometasone-formoterol  2 puff Inhalation BID  . propranolol  10 mg Oral BID  . saccharomyces boulardii  250 mg Oral BID    Assessment: 79 yo F admitted with C. diff colitis who developed afib in the setting of acute illness. PMH significant for NASH liver cirrhosis, PLTC >200, LFTs wnl. Pharmacy consulted to dose heparin for Afib.   Heparin level 0.36 on heparin 1150 units/hr is therapeutic. CBC stable. No reported bleeding.   Goal of Therapy:  Heparin level 0.3 - 0.5 units/mL due to esophageal varices  Monitor platelets by anticoagulation protocol: Yes   Plan:  Continue heparin 1150 units/hr  Monitor heparin level, CBC, and S/S of bleeding daily  Follow up transition to oral anticoagulant  Cristela Felt, PharmD Clinical Pharmacist  11/26/2019 8:14 AM  Please check [email or web address] for unit-specific pharmacy phone numbers.   Addendum: Spoke with Dr. Kurtis Bushman and will transition patient from heparin to apixaban. Patient is > 2 years of age, Scr < 1.5, and weight > 60 kg.   Plan: - Stop heparin and administer first dose of apixaban 62m  (RN aware)  - Start apixaban 584mtwice daily - Consulted case management to assess cost of apixaban  - Pharmacy will provide patient education  - Pharmacy will sign off apixaban consult at this time   GrCristela FeltPharmD Clinical Pharmacist

## 2019-11-26 NOTE — Progress Notes (Signed)
PROGRESS NOTE    [name]  MOL:[id number] DOB: 02-20-1941 DOA: 11/19/2019 PCP: Lauree Chandler, NP    Brief Narrative:  79 y.o.WF lives in [facility] PMHx Fibromyalgia, cirrhosis of the liver secondary to [facility], esophageal varices, HTN, HLD,, asthma, irritable bowel syndrome, presents to the ER because of ongoing weakness for the last 1 week where patient was feeling intensely fatigued. Patient over the last 1 week he has been having increased frequency of urination and diarrhea. Diarrhea is watery. Denies any chest pain or shortness of breath. Denies any loss of consciousness. Given the worsening symptoms patient presents to the ER.  ED Course:In the ER patient is found to be febrile with a temperature 102 F and lab work show sodium 129 WBC 11.9 lactic acid was normal EKG shows normal sinus rhythm. Chest x-ray was unremarkable UA is going which is concerning for UTI. Given the symptoms patient had blood cultures drawn started on fluids empiric antibiotics admitted for further management.    Consultants:   Infectious disease  Procedures:   Antimicrobials:  Vancomycin then changed to Fidaxomicin   Subjective: Had 3 BM diarrhea so far this am. 15 episodes yesterday as she is counting them. No abdominal pain/nv  Objective: Vitals:   11/25/19 2029 11/25/19 2119 11/26/19 0025 11/26/19 0617  BP: (!) 121/50  (!) 128/53 (!) 133/51  Pulse: 60  61 65  Resp:   18 18  Temp: 98.2 F (36.8 C)  97.8 F (36.6 C) 98.5 F (36.9 C)  TempSrc: Oral  Oral Oral  SpO2: 95% 96% 95% 97%  Weight:      Height:        Intake/Output Summary (Last 24 hours) at 11/26/2019 0845 Last data filed at 11/25/2019 2200 Gross per 24 hour  Intake 959.68 ml  Output --  Net 959.68 ml   Filed Weights   11/19/19 1106  Weight: 68 kg    Examination:  General exam: Appears calm and comfortable  Respiratory system: Clear to auscultation. Respiratory effort normal. Cardiovascular system: S1  & S2 heard, RRR. No JVD, murmurs, rubs, gallops or clicks.  Gastrointestinal system: Abdomen is nondistended, soft and nontender. . Normal bowel sounds heard. Central nervous system: Alert and oriented.  Grossly intact Extremities: No edema Skin: Warm dry Psychiatry: Judgement and insight appear normal. Mood & affect appropriate.     Data Reviewed: I have personally reviewed following labs and imaging studies  CBC: Recent Labs  Lab 11/22/19 0334 11/23/19 0649 11/24/19 0454 11/25/19 0502 11/26/19 0433  WBC 8.0 8.0 9.8 11.1* 12.0*  NEUTROABS 2.9 3.5 4.9 5.0 5.7  HGB 10.7* 12.0 11.3* 10.7* 11.3*  HCT 31.7* 36.0 33.7* 32.7* 34.0*  MCV 88.1 89.3 90.6 91.1 90.9  PLT 222 315 294 302 201   Basic Metabolic Panel: Recent Labs  Lab 11/22/19 0334 11/23/19 0649 11/24/19 0454 11/25/19 0502 11/26/19 0433  NA 138 138 138 137 141  K 3.9 3.6 4.9 3.3* 5.1  CL 106 104 105 107 110  CO2 24 23 23  20* 22  GLUCOSE 102* 112* 106* 92 99  BUN <5* <5* 8 9 8   CREATININE 0.72 0.61 0.63 0.62 0.62  CALCIUM 8.6* 9.3 9.2 9.0 9.4  MG 1.7 1.6* 2.0 1.7 1.9  PHOS 2.8 3.3 3.0 4.4 4.1   GFR: Estimated Creatinine Clearance: 51 mL/min (by C-G formula based on SCr of 0.62 mg/dL). Liver Function Tests: Recent Labs  Lab 11/21/19 0237 11/22/19 0334 11/23/19 0649 11/24/19 0454 11/25/19 0502  AST  23 23 26 23 23   ALT 14 16 18 18 16   ALKPHOS 44 62 56 58 52  BILITOT 0.5 0.6 0.9 0.4 0.8  PROT 5.0* 5.4* 6.2* 6.1* 5.5*  ALBUMIN 2.7* 2.9* 3.3* 3.1* 2.9*   Recent Labs  Lab 11/19/19 1121  LIPASE 35   No results for input(s): AMMONIA in the last 168 hours. Coagulation Profile: No results for input(s): INR, PROTIME in the last 168 hours. Cardiac Enzymes: Recent Labs  Lab 11/20/19 0555  CKTOTAL 57   BNP (last 3 results) No results for input(s): PROBNP in the last 8760 hours. HbA1C: No results for input(s): HGBA1C in the last 72 hours. CBG: No results for input(s): GLUCAP in the last 168  hours. Lipid Profile: No results for input(s): CHOL, HDL, LDLCALC, TRIG, CHOLHDL, LDLDIRECT in the last 72 hours. Thyroid Function Tests: No results for input(s): TSH, T4TOTAL, FREET4, T3FREE, THYROIDAB in the last 72 hours. Anemia Panel: No results for input(s): VITAMINB12, FOLATE, FERRITIN, TIBC, IRON, RETICCTPCT in the last 72 hours. Sepsis Labs: Recent Labs  Lab 11/19/19 1121 11/19/19 2005  LATICACIDVEN 1.2 1.1    Recent Results (from the past 240 hour(s))  Urine culture     Status: None   Collection Time: 11/19/19 12:20 PM   Specimen: Urine, Random  Result Value Ref Range Status   Specimen Description URINE, RANDOM  Final   Special Requests NONE  Final   Culture   Final    NO GROWTH Performed at [facility], [address], [place], [place] [address]    Report Status 11/20/2019 FINAL  Final  SARS Coronavirus 2 by RT PCR (hospital order, performed in [facility] hospital lab) Nasopharyngeal Nasopharyngeal Swab     Status: None   Collection Time: 11/19/19 12:20 PM   Specimen: Nasopharyngeal Swab  Result Value Ref Range Status   SARS Coronavirus 2 NEGATIVE NEGATIVE Final    Comment: (NOTE) SARS-CoV-2 target nucleic acids are NOT DETECTED.  The SARS-CoV-2 RNA is generally detectable in upper and lower respiratory specimens during the acute phase of infection. The lowest concentration of SARS-CoV-2 viral copies this assay can detect is 250 copies / mL. A negative result does not preclude SARS-CoV-2 infection and should not be used as the sole basis for treatment or other patient management decisions.  A negative result may occur with improper specimen collection / handling, submission of specimen other than nasopharyngeal swab, presence of viral mutation(s) within the areas targeted by this assay, and inadequate number of viral copies (<250 copies / mL). A negative result must be combined with clinical observations, patient history, and epidemiological  information.  Fact Sheet for Patients:   StrictlyIdeas.no  Fact Sheet for Healthcare Providers: BankingDealers.co.za  This test is not yet approved or  cleared by the [place] FDA and has been authorized for detection and/or diagnosis of SARS-CoV-2 by FDA under an Emergency Use Authorization (EUA).  This EUA will remain in effect (meaning this test can be used) for the duration of the COVID-19 declaration under Section 564(b)(1) of the Act, 21 U.S.C. section 360bbb-3(b)(1), unless the authorization is terminated or revoked sooner.  Performed at [facility], [place] [address], [place], [place] [address]   Culture, blood (routine x 2)     Status: None   Collection Time: 11/19/19  8:00 PM   Specimen: BLOOD LEFT HAND  Result Value Ref Range Status   Specimen Description BLOOD LEFT HAND  Final   Special Requests  Final    BOTTLES DRAWN AEROBIC AND ANAEROBIC Blood Culture adequate volume   Culture   Final    NO GROWTH 5 DAYS Performed at [facility], [place] [address], [place], [place] [address]    Report Status 11/24/2019 FINAL  Final  Culture, blood (routine x 2)     Status: None   Collection Time: 11/19/19  9:00 PM   Specimen: BLOOD RIGHT HAND  Result Value Ref Range Status   Specimen Description BLOOD RIGHT HAND  Final   Special Requests   Final    BOTTLES DRAWN AEROBIC AND ANAEROBIC Blood Culture adequate volume   Culture   Final    NO GROWTH 5 DAYS Performed at [facility], [place] [address] [place], [place] [address]    Report Status 11/24/2019 FINAL  Final  Gastrointestinal Panel by PCR , Stool     Status: Abnormal   Collection Time: 11/20/19  3:56 PM   Specimen: STOOL  Result Value Ref Range Status   Campylobacter species DETECTED (A) NOT DETECTED Final    Comment: RESULT CALLED TO, READ BACK BY AND VERIFIED WITH: MELON BONADO @2011  11/20/19 MJU    Plesimonas shigelloides NOT DETECTED NOT DETECTED  Final   Salmonella species NOT DETECTED NOT DETECTED Final   Yersinia enterocolitica NOT DETECTED NOT DETECTED Final   Vibrio species NOT DETECTED NOT DETECTED Final   Vibrio cholerae NOT DETECTED NOT DETECTED Final   Enteroaggregative E coli (EAEC) NOT DETECTED NOT DETECTED Final   Enteropathogenic E coli (EPEC) NOT DETECTED NOT DETECTED Final   Enterotoxigenic E coli (ETEC) NOT DETECTED NOT DETECTED Final   Shiga like toxin producing E coli (STEC) NOT DETECTED NOT DETECTED Final   Shigella/Enteroinvasive E coli (EIEC) NOT DETECTED NOT DETECTED Final   Cryptosporidium NOT DETECTED NOT DETECTED Final   Cyclospora cayetanensis NOT DETECTED NOT DETECTED Final   Entamoeba histolytica NOT DETECTED NOT DETECTED Final   Giardia lamblia NOT DETECTED NOT DETECTED Final   Adenovirus F40/41 NOT DETECTED NOT DETECTED Final   Astrovirus NOT DETECTED NOT DETECTED Final   Norovirus GI/GII NOT DETECTED NOT DETECTED Final   Rotavirus A NOT DETECTED NOT DETECTED Final   Sapovirus (I, II, IV, and V) NOT DETECTED NOT DETECTED Final    Comment: Performed at [facility], [address] [place], [place] [address]  C Difficile Quick Screen w PCR reflex     Status: Abnormal   Collection Time: 11/20/19  3:56 PM   Specimen: STOOL  Result Value Ref Range Status   C Diff antigen POSITIVE (A) NEGATIVE Final   C Diff toxin POSITIVE (A) NEGATIVE Final   C Diff interpretation Toxin producing C. difficile detected.  Final    Comment: CRITICAL RESULT CALLED TO, READ BACK BY AND VERIFIED WITH: RN ALA ABU AT 1724 11/20/19 BY MM Performed at [facility], [address] [place], [place] [address]          Radiology Studies: No results found.      Scheduled Meds: . amLODipine  2.5 mg Oral Daily  . azithromycin  500 mg Oral Daily  . fidaxomicin  200 mg Oral BID  . losartan  25 mg Oral Daily  . mometasone-formoterol  2 puff Inhalation BID  . propranolol  10 mg Oral BID  . saccharomyces  boulardii  250 mg Oral BID   Continuous Infusions: . sodium chloride 50 mL/hr at 11/26/19 0309  . heparin 1,150 Units/hr (11/26/19 0714)  Assessment & Plan:   Active Problems:   Essential hypertension   Esophageal varices without bleeding (HCC)   Fever   Acute lower UTI   Diarrhea   C. difficile colitis   Other cirrhosis of liver ([place])   New onset atrial fibrillation (HCC)   C. difficile colitis  C. difficile test was positive.  Patient was started on oral vancomycin on July 10.  GI pathogen panel was positive for Campylobacter.  Since there was no improvement in her diarrhea and she was started on azithromycin compeleted 3 day dose.   She was started on Dificid.  Per ID, continue this for 10 days. Will ask pharmacy to help with high cost. Still with lots of diarrhea, 15 yesterday this am 3 already. Will continue with gentle hydration, monitor diarrhea, if slowing can d/c then     New onset atrial fibrillation Patient noted to have atrial fibrillation while she was here in the hospital.  Apparently no similar history in the past.  TSH was normal.  Echocardiogram has been done as well.  Shows normal systolic function.  Some left atrial dilatation is noted.  No significant valvular disease.  Heart rate seems to be stable.  Patient on propranolol which will be continued for now.  Patient started on heparin infusion for stroke prevention.  Will need to be switched to oral agent when she is improving.  Chads 2 vascular score is 4. Plan: Will switch heparin gtt to Eliquis   Hyponatremia Likely due to hypovolemia.  Now resolved.  Sodium 141 today, will continue to monitor   Hypokalemia/hypomagnesemia Likely due to GI loss. was repleted. K 5.1 today Continue to monitor.   Questionable ascites, ruled out -US abdomen negative for ascites.  History of liver cirrhosis secondary to NASH Seems to be stable.   Continue to monitor   Essential HTN  Blood pressure is  controlled.  Diastolic is low.  With her diarrhea I will discontinue her low-dose amlodipine We will continue losartan and propanolol    Asthma -Currently not an issue  DVT prophylaxis: Heparin drip  Code Status: DNR Family Communication: Discussed with the patient Status is: Inpatient  Remains inpatient appropriate because:IV treatments appropriate due to intensity of illness or inability to take PO   Dispo:  Patient From: Home  Planned Disposition: Home  Expected discharge date: 11/27/19  Medically stable for discharge: No             LOS: 6 days   Time spent: 35 min with >50% on coc    Nolberto Hanlon, MD [facility] Pager [phone]  If 7PM-7AM, please contact night-coverage [email or web address] Password [facility] 11/26/2019, 8:45 AM

## 2019-11-26 NOTE — Progress Notes (Signed)
[place] for Infectious Disease   Reason for visit: Follow up on C diff infection  Interval History: she feels she is much improved with more formed stools, less volume and less frequency.  No fever.  No associated rash.    Physical Exam: Constitutional:  Vitals:   11/26/19 0617 11/26/19 0848  BP: (!) 133/51 113/87  Pulse: 65   Resp: 18   Temp: 98.5 F (36.9 C)   SpO2: 97%    patient appears in NAD Respiratory: Normal respiratory effort; CTA B Cardiovascular: RRR GI: soft, nt, nd  Review of Systems: Constitutional: negative for fevers and chills Integument/breast: negative for rash  Lab Results  Component Value Date   WBC 12.0 (H) 11/26/2019   HGB 11.3 (L) 11/26/2019   HCT 34.0 (L) 11/26/2019   MCV 90.9 11/26/2019   PLT 365 11/26/2019    Lab Results  Component Value Date   CREATININE 0.62 11/26/2019   BUN 8 11/26/2019   NA 141 11/26/2019   K 5.1 11/26/2019   CL 110 11/26/2019   CO2 22 11/26/2019    Lab Results  Component Value Date   ALT 16 11/25/2019   AST 23 11/25/2019   ALKPHOS 52 11/25/2019     Microbiology: Recent Results (from the past 240 hour(s))  Urine culture     Status: None   Collection Time: 11/19/19 12:20 PM   Specimen: Urine, Random  Result Value Ref Range Status   Specimen Description URINE, RANDOM  Final   Special Requests NONE  Final   Culture   Final    NO GROWTH Performed at [facility], [address], [place], [place] [address]    Report Status 11/20/2019 FINAL  Final  SARS Coronavirus 2 by RT PCR (hospital order, performed in [facility] hospital lab) Nasopharyngeal Nasopharyngeal Swab     Status: None   Collection Time: 11/19/19 12:20 PM   Specimen: Nasopharyngeal Swab  Result Value Ref Range Status   SARS Coronavirus 2 NEGATIVE NEGATIVE Final    Comment: (NOTE) SARS-CoV-2 target nucleic acids are NOT DETECTED.  The SARS-CoV-2 RNA is generally detectable in upper and lower respiratory specimens during  the acute phase of infection. The lowest concentration of SARS-CoV-2 viral copies this assay can detect is 250 copies / mL. A negative result does not preclude SARS-CoV-2 infection and should not be used as the sole basis for treatment or other patient management decisions.  A negative result may occur with improper specimen collection / handling, submission of specimen other than nasopharyngeal swab, presence of viral mutation(s) within the areas targeted by this assay, and inadequate number of viral copies (<250 copies / mL). A negative result must be combined with clinical observations, patient history, and epidemiological information.  Fact Sheet for Patients:   StrictlyIdeas.no  Fact Sheet for Healthcare Providers: BankingDealers.co.za  This test is not yet approved or  cleared by the [place] FDA and has been authorized for detection and/or diagnosis of SARS-CoV-2 by FDA under an Emergency Use Authorization (EUA).  This EUA will remain in effect (meaning this test can be used) for the duration of the COVID-19 declaration under Section 564(b)(1) of the Act, 21 U.S.C. section 360bbb-3(b)(1), unless the authorization is terminated or revoked sooner.  Performed at [facility], [place] [address], [place], [place] [address]   Culture, blood (routine x 2)     Status: None   Collection Time: 11/19/19  8:00 PM   Specimen: BLOOD LEFT  HAND  Result Value Ref Range Status   Specimen Description BLOOD LEFT HAND  Final   Special Requests   Final    BOTTLES DRAWN AEROBIC AND ANAEROBIC Blood Culture adequate volume   Culture   Final    NO GROWTH 5 DAYS Performed at [facility], [address] [place], [place] [address]    Report Status 11/24/2019 FINAL  Final  Culture, blood (routine x 2)     Status: None   Collection Time: 11/19/19  9:00 PM   Specimen: BLOOD RIGHT HAND  Result Value Ref Range Status   Specimen  Description BLOOD RIGHT HAND  Final   Special Requests   Final    BOTTLES DRAWN AEROBIC AND ANAEROBIC Blood Culture adequate volume   Culture   Final    NO GROWTH 5 DAYS Performed at [facility], [place] [address], [place], [place] [address]    Report Status 11/24/2019 FINAL  Final  Gastrointestinal Panel by PCR , Stool     Status: Abnormal   Collection Time: 11/20/19  3:56 PM   Specimen: STOOL  Result Value Ref Range Status   Campylobacter species DETECTED (A) NOT DETECTED Final    Comment: RESULT CALLED TO, READ BACK BY AND VERIFIED WITH: MELON BONADO @2011  11/20/19 MJU    Plesimonas shigelloides NOT DETECTED NOT DETECTED Final   Salmonella species NOT DETECTED NOT DETECTED Final   Yersinia enterocolitica NOT DETECTED NOT DETECTED Final   Vibrio species NOT DETECTED NOT DETECTED Final   Vibrio cholerae NOT DETECTED NOT DETECTED Final   Enteroaggregative E coli (EAEC) NOT DETECTED NOT DETECTED Final   Enteropathogenic E coli (EPEC) NOT DETECTED NOT DETECTED Final   Enterotoxigenic E coli (ETEC) NOT DETECTED NOT DETECTED Final   Shiga like toxin producing E coli (STEC) NOT DETECTED NOT DETECTED Final   Shigella/Enteroinvasive E coli (EIEC) NOT DETECTED NOT DETECTED Final   Cryptosporidium NOT DETECTED NOT DETECTED Final   Cyclospora cayetanensis NOT DETECTED NOT DETECTED Final   Entamoeba histolytica NOT DETECTED NOT DETECTED Final   Giardia lamblia NOT DETECTED NOT DETECTED Final   Adenovirus F40/41 NOT DETECTED NOT DETECTED Final   Astrovirus NOT DETECTED NOT DETECTED Final   Norovirus GI/GII NOT DETECTED NOT DETECTED Final   Rotavirus A NOT DETECTED NOT DETECTED Final   Sapovirus (I, II, IV, and V) NOT DETECTED NOT DETECTED Final    Comment: Performed at [facility], [place] [place], [place] [address]  C Difficile Quick Screen w PCR reflex     Status: Abnormal   Collection Time: 11/20/19  3:56 PM   Specimen: STOOL  Result Value Ref Range Status   C  Diff antigen POSITIVE (A) NEGATIVE Final   C Diff toxin POSITIVE (A) NEGATIVE Final   C Diff interpretation Toxin producing C. difficile detected.  Final    Comment: CRITICAL RESULT CALLED TO, READ BACK BY AND VERIFIED WITH: RN ALA ABU AT 1724 11/20/19 BY MM Performed at [facility], [address] [place], [place] [address]     Impression/Plan:  1. C diff infection - improved now with change to Dificid.  Will continue with this for 10 days.  Copay assistance per our pharmacy team due to the high cost.    2.  Campylobacter - unclear if this was causing any of her symptoms but she did complete 3 days of appropriate therapy with azithromycin.  No further treatment indicated.    I will sign  off, I am available for follow up if needed.  thanks

## 2019-11-26 NOTE — Care Management (Signed)
Entered a benefit check forEliquis. Tubed bedside nurse a 30 day free card to give to patient.   Magdalen Spatz RN

## 2019-11-26 NOTE — Progress Notes (Addendum)
Infectious Diseases Pharmacy Progress Note   Ms. Gibler has been approved for Dificid Patient Assistance. A supply of Dificid will be mailed to her address either tomorrow 7/17 if [employer] can deliver or Monday 7/19 at the latest.    She is approved for refills through the program through May 12, 2020.   Addendum 16:32- Confirmed with [employer] that [place] will be delivered to Ms Ocampo's home tomorrow 7/17.   [name], PharmD, BCPS, BCIDP Infectious Diseases Clinical Pharmacist Phone: [phone] 11/26/2019 2:35 PM

## 2019-11-26 NOTE — Care Management (Signed)
Entered benefit check for dificid 263m twice daily x 10 days.   HMagdalen SpatzRN

## 2019-11-26 NOTE — TOC Benefit Eligibility Note (Signed)
Transition of Care [facility]) Benefit Eligibility Note    Patient Details  Name: [name] MRN: [id number] Date of Birth: [birth year]   Medication/Dose: dificid 252m twice daily for 10 days  Covered?: Yes  Tier: Other (tier 5)  Prescription Coverage Preferred Pharmacy: most major retail pharmacies  Spoke with Person/Company/Phone Number:: [employer] [phone] Co-Pay: co-pay for 10 day supply $1045.27  Prior Approval: No     Additional Notes: Patient is in coverage gap. Co-pay for all medications is 25% of total cost    MMellon FinancialPhone Number: 11/26/2019, 10:12 AM

## 2019-11-27 LAB — BASIC METABOLIC PANEL
Anion gap: 10 (ref 5–15)
BUN: 7 mg/dL — ABNORMAL LOW (ref 8–23)
CO2: 22 mmol/L (ref 22–32)
Calcium: 9.1 mg/dL (ref 8.9–10.3)
Chloride: 103 mmol/L (ref 98–111)
Creatinine, Ser: 0.51 mg/dL (ref 0.44–1.00)
GFR calc Af Amer: >60 mL/min (ref >60)
GFR calc non Af Amer: >60 mL/min (ref >60)
Glucose, Bld: 92 mg/dL (ref 70–99)
Potassium: 3.5 mmol/L (ref 3.5–5.1)
Sodium: 135 mmol/L (ref 135–145)

## 2019-11-27 LAB — MAGNESIUM: Magnesium: 1.7 mg/dL (ref 1.7–2.4)

## 2019-11-27 LAB — CBC WITH DIFFERENTIAL/PLATELET
Abs Immature Granulocytes: 0.19 10*3/uL — ABNORMAL HIGH (ref 0.00–0.07)
Basophils Absolute: 0.1 10*3/uL (ref 0.0–0.1)
Basophils Relative: 1 %
Eosinophils Absolute: 0.2 10*3/uL (ref 0.0–0.5)
Eosinophils Relative: 1 %
HCT: 36.5 % (ref 36.0–46.0)
Hemoglobin: 12.2 g/dL (ref 12.0–15.0)
Immature Granulocytes: 2 %
Lymphocytes Relative: 32 %
Lymphs Abs: 3.3 10*3/uL (ref 0.7–4.0)
MCH: 30.5 pg (ref 26.0–34.0)
MCHC: 33.4 g/dL (ref 30.0–36.0)
MCV: 91.3 fL (ref 80.0–100.0)
Monocytes Absolute: 1 10*3/uL (ref 0.1–1.0)
Monocytes Relative: 10 %
Neutro Abs: 5.7 10*3/uL (ref 1.7–7.7)
Neutrophils Relative %: 54 %
Platelets: 371 10*3/uL (ref 150–400)
RBC: 4 MIL/uL (ref 3.87–5.11)
RDW: 13.5 % (ref 11.5–15.5)
WBC: 10.4 10*3/uL (ref 4.0–10.5)
nRBC: 0 % (ref 0.0–0.2)

## 2019-11-27 LAB — PHOSPHORUS: Phosphorus: 3.5 mg/dL (ref 2.5–4.6)

## 2019-11-27 MED ORDER — AMLODIPINE BESYLATE 5 MG PO TABS: 5.0000 mg | ORAL_TABLET | Freq: Every day | ORAL | 0 refills | Status: DC

## 2019-11-27 MED ORDER — SACCHAROMYCES BOULARDII 250 MG PO CAPS: 250.0000 mg | ORAL_CAPSULE | Freq: Two times a day (BID) | ORAL | 0 refills | Status: DC

## 2019-11-27 MED ORDER — FIDAXOMICIN 200 MG PO TABS: 200.0000 mg | ORAL_TABLET | Freq: Two times a day (BID) | ORAL | Status: DC

## 2019-11-27 MED ORDER — APIXABAN 5 MG PO TABS: 5.0000 mg | ORAL_TABLET | Freq: Two times a day (BID) | ORAL | 0 refills | Status: DC

## 2019-11-27 NOTE — Progress Notes (Signed)
Pt given discharge summary and discharged via family as transportation.

## 2019-11-27 NOTE — TOC Transition Note (Signed)
Transition of Care [facility]) - CM/SW Discharge Note   Patient Details  Name: [name] MRN: [id number] Date of Birth: 12/11/1940  [facility] CM/SW Contact:  Bartholomew Crews, RN Phone Number: [phone] 11/27/2019, 4:11 PM   Clinical Narrative:     Spoke with patient at the bedside. Dificid has been delivered to her home. Patient had several questions about her medications and self care d/t c. diff. Reviewed with patient along with bedside nurse. No further TOC needs identified.   Final next level of care: Home/Self Care Barriers to Discharge: No Barriers Identified   Patient Goals and CMS Choice Patient states their goals for this hospitalization and ongoing recovery are:: return home CMS Medicare.gov Compare Post Acute Care list provided to:: Patient Choice offered to / list presented to : NA  Discharge Placement                       Discharge Plan and Services                DME Arranged: N/A DME Agency: NA       HH Arranged: NA HH Agency: NA        Social Determinants of Health (SDOH) Interventions     Readmission Risk Interventions No flowsheet data found.

## 2019-11-27 NOTE — Discharge Summary (Addendum)
[name] YKD:[id number] DOB: 07-May-1941 DOA: 11/19/2019  PCP: Lauree Chandler, NP  Admit date: 11/19/2019 Discharge date: 11/27/2019  Admitted From: home  Disposition:  home  Recommendations for Outpatient Follow-up:  1. Follow up with PCP in 1 week 2. Please obtain BMP/CBC in one week     Discharge Condition:Stable CODE STATUS: DNR Diet recommendation: Heart Healthy  Brief/Interim Summary: [name] is a 79 y.o. female with history of cirrhosis of the liver secondary to [facility], hypertension, asthma presents to the ER because of ongoing weakness for the last 1 week where patient was feeling intensely fatigued.  Patient over the last 1 week he has been having increased frequency of urination and diarrhea.  Diarrhea is watery.    Given the worsening symptoms patient presented to the ER.In the ER patient is found to be febrile with a temperature 102 F and lab work show sodium 129 WBC 11.9 lactic acid was normal EKG shows normal sinus rhythm.  Chest x-ray was unremarkable UA is going which is concerning for UTI.  Given the symptoms patient had blood cultures drawn started on fluids empiric antibiotics admitted for further management.  Patient had complained of diarrhea and was febrile.  She was found with positive C. difficile stool test.  C. difficile colitis  Patient was started on oral vancomycin on July 10. GI pathogen panel was positive for Campylobacter. Since there was no improvement in her diarrhea and she was started on azithromycin compeleted 3 day dose She was loaded on Dificid ID was consulted and per ID she needs to continue taking Dificid for 10 days.  Pharmacy has sent the medication to her house and verified by her neighbor that she has received it (due to the high cost of the medication).  Her diarrhea finally has resolved   New onset atrial fibrillation Patient noted to have atrial fibrillation while she was here in the hospital.  Apparently no similar history  in the past.  TSH was normal.  Echocardiogram has been done as well.  Shows normal systolic function.  Some left atrial dilatation is noted.   Patient on propranolol which was continued. She is now on a/c with Eliquis. She was told to f/u with pcp for further mx, may need outpt cards referal if indicated by her pcp   Hyponatremia Likely due to hypovolemia.   Resolved with ivf.   Hypokalemia Likely due to GI loss.  Potassium is repleted.     Hypomagnesmia Magnesium stable.  Ascites? -US abdomen negative for ascites.  History of liver cirrhosis secondary to NASH Seems to be stable.  Continue to monitor.    Essential HTN  Overall stable.  Asthma No exacerbation.     Discharge Diagnoses:  Active Problems:   Essential hypertension   Esophageal varices without bleeding (HCC)   Fever   Acute lower UTI   Diarrhea   C. difficile colitis   Other cirrhosis of liver ([place])   New onset atrial fibrillation [facility])    Discharge Instructions  Discharge Instructions    Call MD for:  temperature >100.4   Complete by: As directed    Diet - low sodium heart healthy   Complete by: As directed    Discharge instructions   Complete by: As directed    Follow-up with PCP next week will need labs   Increase activity slowly   Complete by: As directed      Allergies as of 11/27/2019      Reactions  Ace Inhibitors    cough   Amoxicillin Itching   REACTION: unknown? pain in right kidney   Benicar Hct [olmesartan Medoxomil-hctz]    Extreme weakness   Budesonide-formoterol Fumarate    Causes tremors and numbness   Chlorzoxazone [chlorzoxazone]    Citalopram Hydrobromide    REACTION: hives   Citalopram Hydrobromide    Clonidine Derivatives    Cymbalta [duloxetine Hcl]    Droperidol    REACTION: hives   Flexeril [cyclobenzaprine Hcl]    Fluconazole Nausea And Vomiting, Other (See Comments)   fatigue   Gabapentin Itching   Ketorolac Tromethamine    REACTION: hives    Ketorolac Tromethamine    Metoclopramide Hcl    Mometasone Furo-formoterol Fum    Causes sore throat, blurred vision and unable to sleep--started on med 09-17-10   Morphine    REACTION: hives and itching   Olmesartan Medoxomil    REACTION: fatique   Breo Ellipta [fluticasone Furoate-vilanterol] Itching   Pt reports itching with Memory Dance is related to being lactose intolerant.       Medication List    STOP taking these medications   benzonatate 100 MG capsule Commonly known as: TESSALON   hydrochlorothiazide 25 MG tablet Commonly known as: HYDRODIURIL   Klor-Con M20 20 MEQ tablet Generic drug: potassium chloride SA     TAKE these medications   acetaminophen 500 MG tablet Commonly known as: TYLENOL Take 1,000 mg by mouth 2 (two) times a day. Notes to patient: Continue home schedule   Advair HFA 115-21 MCG/ACT inhaler Generic drug: fluticasone-salmeterol TAKE 2 PUFFS BY MOUTH TWICE A DAY What changed: See the new instructions. Notes to patient: Continue home schedule   amLODipine 5 MG tablet Commonly known as: NORVASC Take 1 tablet (5 mg total) by mouth daily. What changed:   medication strength  how much to take Notes to patient: 11/27/19   apixaban 5 MG Tabs tablet Commonly known as: ELIQUIS Take 1 tablet (5 mg total) by mouth 2 (two) times daily. Notes to patient: 11/27/19   Calcium 600+D 600-400 MG-UNIT tablet Generic drug: Calcium Carbonate-Vitamin D Take 1 tablet by mouth 2 (two) times daily. Notes to patient: Continue home schedule   calcium carbonate 500 MG chewable tablet Commonly known as: TUMS - dosed in mg elemental calcium Chew 1 tablet by mouth as needed for indigestion or heartburn.   CENTRUM SILVER PO Take 1 tablet by mouth daily. Notes to patient: Continue home schedule   Dificid 200 MG Tabs tablet Generic drug: fidaxomicin Take 1 tablet (200 mg total) by mouth 2 (two) times daily. Notes to patient: 11/27/19   fidaxomicin 200 MG Tabs  tablet Commonly known as: DIFICID Take 1 tablet (200 mg total) by mouth 2 (two) times daily. Notes to patient: 11/27/19   esomeprazole 40 MG capsule Commonly known as: NEXIUM Take 1 tablet by mouth once or twice daily as needed What changed:   how much to take  how to take this  when to take this  reasons to take this  additional instructions   Fish Oil Maximum Strength 1200 MG Caps Take 1,200 mg by mouth 2 (two) times daily.   losartan 25 MG tablet Commonly known as: COZAAR TAKE 1 TABLET BY MOUTH EVERY DAY   meclizine 25 MG tablet Commonly known as: ANTIVERT TAKE 1 TABLET BY MOUTH 3 TIMES A DAY AS NEEDED FOR DIZZINESS OR NAUSEA What changed:   how much to take  how to take this  when to  take this  reasons to take this  additional instructions   ondansetron 4 MG disintegrating tablet Commonly known as: Zofran ODT Take 1 tablet (4 mg total) by mouth every 8 (eight) hours as needed for nausea or vomiting.   ProAir HFA 108 (90 Base) MCG/ACT inhaler Generic drug: albuterol INHALE 2 PUFFS EVERY 4 HOURS AS NEEDED INTO THE LUNGS FOR WHEEZING OR SHORTNESS OF BREATH What changed: See the new instructions.   propranolol 10 MG tablet Commonly known as: INDERAL Take 1 tablet (10 mg total) by mouth 2 (two) times daily. You are due for an office visit with Dr. Havery Moros. Please call to schedule. Thank you   Propylene Glycol 0.6 % Soln Commonly known as: Systane Balance Place 1 drop into both ears 2 (two) times daily.   saccharomyces boulardii 250 MG capsule Commonly known as: FLORASTOR Take 1 capsule (250 mg total) by mouth 2 (two) times daily.       Follow-up Information    Lauree Chandler, NP.   Specialty: Geriatric Medicine Why: As soon as possible for follow up, For any further management of this issue Contact information: [address]. [place] [address] [phone]        [place].   Specialty:  Emergency Medicine Why: As needed Contact information: [address] [id number] [place] [address] [phone]             Allergies  Allergen Reactions  . Ace Inhibitors     cough  . Amoxicillin Itching    REACTION: unknown? pain in right kidney  . Benicar Hct [Olmesartan Medoxomil-Hctz]     Extreme weakness  . Budesonide-Formoterol Fumarate     Causes tremors and numbness  . Chlorzoxazone [Chlorzoxazone]   . Citalopram Hydrobromide     REACTION: hives  . Citalopram Hydrobromide   . Clonidine Derivatives   . Cymbalta [Duloxetine Hcl]   . Droperidol     REACTION: hives  . Flexeril [Cyclobenzaprine Hcl]   . Fluconazole Nausea And Vomiting and Other (See Comments)    fatigue  . Gabapentin Itching  . Ketorolac Tromethamine     REACTION: hives  . Ketorolac Tromethamine   . Metoclopramide Hcl   . Mometasone Furo-Formoterol Fum     Causes sore throat, blurred vision and unable to sleep--started on med 09-17-10  . Morphine     REACTION: hives and itching  . Olmesartan Medoxomil     REACTION: fatique  . Breo Ellipta [Fluticasone Furoate-Vilanterol] Itching    Pt reports itching with Memory Dance is related to being lactose intolerant.     Consultations:  ID   Procedures/Studies: DG Chest 2 View  Result Date: 11/19/2019 CLINICAL DATA:  Weakness. EXAM: CHEST - 2 VIEW COMPARISON:  March 28, 2018. FINDINGS: The heart size and mediastinal contours are within normal limits. Both lungs are clear. No pneumothorax pleural effusion is noted. Atherosclerosis of thoracic aorta is noted. The visualized skeletal structures are unremarkable. IMPRESSION: No active cardiopulmonary disease. Aortic Atherosclerosis (ICD10-I70.0). Electronically Signed   By: Marijo Conception M.D.   On: 11/19/2019 12:04   ECHOCARDIOGRAM COMPLETE  Result Date: 11/23/2019    ECHOCARDIOGRAM REPORT   Patient Name:   ANGELEE BAHR [facility] Date of Exam: 11/23/2019 Medical Rec #:  [id number]         Height:       63.0 in Accession #:    [id number]       Weight:  150.0 lb Date of Birth:  September 09, 1940         BSA:          72.711 m Patient Age:    9 years         BP:           126/56 mmHg Patient Gender: F                HR:           65 bpm. Exam Location:  Inpatient Procedure: 2D Echo Indications:    Atrial fibrillation 427.31  History:        Patient has no prior history of Echocardiogram examinations.                 Cirrhosis. C.diff, Arrythmias:Atrial Fibrillation,                 Signs/Symptoms:Fever; Risk Factors:Hypertension and                 Dyslipidemia.  Sonographer:    Johny Chess Referring Phys: [id number] C[place]  1. Left ventricular ejection fraction, by estimation, is 60 to 65%. The left ventricle has normal function. The left ventricle has no regional wall motion abnormalities. Left ventricular diastolic parameters are indeterminate.  2. Right ventricular systolic function is normal. The right ventricular size is normal. There is mildly elevated pulmonary artery systolic pressure.  3. Left atrial size was mild to moderately dilated.  4. The mitral valve is normal in structure. Trivial mitral valve regurgitation. No evidence of mitral stenosis.  5. The aortic valve is tricuspid. Aortic valve regurgitation is not visualized. No aortic stenosis is present.  6. The inferior vena cava is normal in size with <50% respiratory variability, suggesting right atrial pressure of 8 mmHg. Comparison(s): No prior Echocardiogram. Conclusion(s)/Recommendation(s): Normal biventricular function without evidence of hemodynamically significant valvular heart disease. FINDINGS  Left Ventricle: Left ventricular ejection fraction, by estimation, is 60 to 65%. The left ventricle has normal function. The left ventricle has no regional wall motion abnormalities. The left ventricular internal cavity size was normal in size. There is  no left ventricular hypertrophy. Left ventricular diastolic  parameters are indeterminate. Right Ventricle: The right ventricular size is normal. No increase in right ventricular wall thickness. Right ventricular systolic function is normal. There is mildly elevated pulmonary artery systolic pressure. The tricuspid regurgitant velocity is 2.89  m/s, and with an assumed right atrial pressure of 8 mmHg, the estimated right ventricular systolic pressure is 16.0 mmHg. Left Atrium: Left atrial size was mild to moderately dilated. Right Atrium: Right atrial size was normal in size. Pericardium: There is no evidence of pericardial effusion. Mitral Valve: The mitral valve is normal in structure. Mild mitral annular calcification. Trivial mitral valve regurgitation. No evidence of mitral valve stenosis. Tricuspid Valve: The tricuspid valve is normal in structure. Tricuspid valve regurgitation is mild. Aortic Valve: The aortic valve is tricuspid. Aortic valve regurgitation is not visualized. No aortic stenosis is present. Pulmonic Valve: The pulmonic valve was grossly normal. Pulmonic valve regurgitation is trivial. No evidence of pulmonic stenosis. Aorta: The aortic root, ascending aorta and aortic arch are all structurally normal, with no evidence of dilitation or obstruction. Venous: The inferior vena cava is normal in size with less than 50% respiratory variability, suggesting right atrial pressure of 8 mmHg. IAS/Shunts: The atrial septum is grossly normal.  LEFT VENTRICLE PLAX 2D LVIDd:         4.60 cm  Diastology LVIDs:         2.60 cm  LV e' lateral:   9.57 cm/s LV PW:         0.90 cm  LV E/e' lateral: 10.9 LV IVS:        0.80 cm  LV e' medial:    8.49 cm/s LVOT diam:     1.90 cm  LV E/e' medial:  12.2 LV SV:         65 LV SV Index:   38 LVOT Area:     2.84 cm  RIGHT VENTRICLE             IVC RV S prime:     11.30 cm/s  IVC diam: 2.00 cm TAPSE (M-mode): 2.4 cm LEFT ATRIUM             Index       RIGHT ATRIUM           Index LA diam:        3.90 cm 2.28 cm/m  RA Area:     14.20  cm LA Vol (A2C):   63.0 ml 36.82 ml/m RA Volume:   35.00 ml  20.45 ml/m LA Vol (A4C):   63.8 ml 37.29 ml/m LA Biplane Vol: 65.5 ml 38.28 ml/m  AORTIC VALVE LVOT Vmax:   105.00 cm/s LVOT Vmean:  66.900 cm/s LVOT VTI:    0.230 m  AORTA Ao Root diam: 3.20 cm Ao Asc diam:  3.30 cm MITRAL VALVE                TRICUSPID VALVE MV Area (PHT): 3.08 cm     TV Peak grad:   33.9 mmHg MV Decel Time: 246 msec     TV Vmax:        2.91 m/s MV E velocity: 104.00 cm/s  TR Peak grad:   33.4 mmHg MV A velocity: 50.10 cm/s   TR Vmax:        289.00 cm/s MV E/A ratio:  2.08                             SHUNTS                             Systemic VTI:  0.23 m                             Systemic Diam: 1.90 cm Buford Dresser MD Electronically signed by Buford Dresser MD Signature Date/Time: 11/23/2019/5:49:20 PM    Final    [place] ASCITES (ABDOMEN LIMITED)  Result Date: 11/20/2019 CLINICAL DATA:  Abdominal pain EXAM: LIMITED ABDOMEN ULTRASOUND FOR ASCITES TECHNIQUE: Limited ultrasound survey for ascites was performed in all four abdominal quadrants. COMPARISON:  None. FINDINGS: No ascites visualized by the sonographer. IMPRESSION: No ascites visualized. Electronically Signed   By: Ulyses Jarred M.D.   On: 11/20/2019 06:48      Subjective: No diarrhea today feels better.  Discharge Exam: Vitals:   11/27/19 0549 11/27/19 1227  BP: (!) 142/62 (!) 159/60  Pulse: (!) 54 (!) 59  Resp: 18 16  Temp: 98 F (36.7 C) 97.8 F (36.6 C)  SpO2: 96% 99%   Vitals:   11/26/19 2117 11/26/19 2359 11/27/19 0549 11/27/19 1227  BP: (!) 148/55 (!) 153/60 (!) 142/62 (!) 159/60  Pulse: 61 63 (!) 54 (!)  59  Resp:  18 18 16   Temp:  98.7 F (37.1 C) 98 F (36.7 C) 97.8 F (36.6 C)  TempSrc:  Oral Oral Oral  SpO2:  97% 96% 99%  Weight:      Height:        General: Pt is alert, awake, not in acute distress Cardiovascular: RRR, S1/S2 +, no rubs, no gallops Respiratory: CTA bilaterally, no wheezing, no  rhonchi Abdominal: Soft, NT, ND, bowel sounds + Extremities: no edema, no cyanosis    The results of significant diagnostics from this hospitalization (including imaging, microbiology, ancillary and laboratory) are listed below for reference.     Microbiology: Recent Results (from the past 240 hour(s))  Urine culture     Status: None   Collection Time: 11/19/19 12:20 PM   Specimen: Urine, Random  Result Value Ref Range Status   Specimen Description URINE, RANDOM  Final   Special Requests NONE  Final   Culture   Final    NO GROWTH Performed at [facility], [address] [place], [place] [address]    Report Status 11/20/2019 FINAL  Final  SARS Coronavirus 2 by RT PCR (hospital order, performed in [facility] hospital lab) Nasopharyngeal Nasopharyngeal Swab     Status: None   Collection Time: 11/19/19 12:20 PM   Specimen: Nasopharyngeal Swab  Result Value Ref Range Status   SARS Coronavirus 2 NEGATIVE NEGATIVE Final    Comment: (NOTE) SARS-CoV-2 target nucleic acids are NOT DETECTED.  The SARS-CoV-2 RNA is generally detectable in upper and lower respiratory specimens during the acute phase of infection. The lowest concentration of SARS-CoV-2 viral copies this assay can detect is 250 copies / mL. A negative result does not preclude SARS-CoV-2 infection and should not be used as the sole basis for treatment or other patient management decisions.  A negative result may occur with improper specimen collection / handling, submission of specimen other than nasopharyngeal swab, presence of viral mutation(s) within the areas targeted by this assay, and inadequate number of viral copies (<250 copies / mL). A negative result must be combined with clinical observations, patient history, and epidemiological information.  Fact Sheet for Patients:   StrictlyIdeas.no  Fact Sheet for Healthcare Providers: BankingDealers.co.za  This  test is not yet approved or  cleared by the [place] FDA and has been authorized for detection and/or diagnosis of SARS-CoV-2 by FDA under an Emergency Use Authorization (EUA).  This EUA will remain in effect (meaning this test can be used) for the duration of the COVID-19 declaration under Section 564(b)(1) of the Act, 21 U.S.C. section 360bbb-3(b)(1), unless the authorization is terminated or revoked sooner.  Performed at [facility], [place] [address], [place], [place] [address]   Culture, blood (routine x 2)     Status: None   Collection Time: 11/19/19  8:00 PM   Specimen: BLOOD LEFT HAND  Result Value Ref Range Status   Specimen Description BLOOD LEFT HAND  Final   Special Requests   Final    BOTTLES DRAWN AEROBIC AND ANAEROBIC Blood Culture adequate volume   Culture   Final    NO GROWTH 5 DAYS Performed at [facility], [place] [address], [place], [place] [address]    Report Status 11/24/2019 FINAL  Final  Culture, blood (routine x 2)     Status: None   Collection Time: 11/19/19  9:00 PM   Specimen: BLOOD RIGHT HAND  Result Value Ref Range Status  Specimen Description BLOOD RIGHT HAND  Final   Special Requests   Final    BOTTLES DRAWN AEROBIC AND ANAEROBIC Blood Culture adequate volume   Culture   Final    NO GROWTH 5 DAYS Performed at [facility], [address] [place], [place] [address]    Report Status 11/24/2019 FINAL  Final  Gastrointestinal Panel by PCR , Stool     Status: Abnormal   Collection Time: 11/20/19  3:56 PM   Specimen: STOOL  Result Value Ref Range Status   Campylobacter species DETECTED (A) NOT DETECTED Final    Comment: RESULT CALLED TO, READ BACK BY AND VERIFIED WITH: MELON BONADO @2011  11/20/19 MJU    Plesimonas shigelloides NOT DETECTED NOT DETECTED Final   Salmonella species NOT DETECTED NOT DETECTED Final   Yersinia enterocolitica NOT DETECTED NOT DETECTED Final   Vibrio species NOT DETECTED NOT DETECTED Final   Vibrio  cholerae NOT DETECTED NOT DETECTED Final   Enteroaggregative E coli (EAEC) NOT DETECTED NOT DETECTED Final   Enteropathogenic E coli (EPEC) NOT DETECTED NOT DETECTED Final   Enterotoxigenic E coli (ETEC) NOT DETECTED NOT DETECTED Final   Shiga like toxin producing E coli (STEC) NOT DETECTED NOT DETECTED Final   Shigella/Enteroinvasive E coli (EIEC) NOT DETECTED NOT DETECTED Final   Cryptosporidium NOT DETECTED NOT DETECTED Final   Cyclospora cayetanensis NOT DETECTED NOT DETECTED Final   Entamoeba histolytica NOT DETECTED NOT DETECTED Final   Giardia lamblia NOT DETECTED NOT DETECTED Final   Adenovirus F40/41 NOT DETECTED NOT DETECTED Final   Astrovirus NOT DETECTED NOT DETECTED Final   Norovirus GI/GII NOT DETECTED NOT DETECTED Final   Rotavirus A NOT DETECTED NOT DETECTED Final   Sapovirus (I, II, IV, and V) NOT DETECTED NOT DETECTED Final    Comment: Performed at [facility], [address] [place], [place] [address]  C Difficile Quick Screen w PCR reflex     Status: Abnormal   Collection Time: 11/20/19  3:56 PM   Specimen: STOOL  Result Value Ref Range Status   C Diff antigen POSITIVE (A) NEGATIVE Final   C Diff toxin POSITIVE (A) NEGATIVE Final   C Diff interpretation Toxin producing C. difficile detected.  Final    Comment: CRITICAL RESULT CALLED TO, READ BACK BY AND VERIFIED WITH: RN ALA ABU AT 1724 11/20/19 BY MM Performed at [facility], [address], [place], [place] [address]      Labs: BNP (last 3 results) No results for input(s): BNP in the last 8760 hours. Basic Metabolic Panel: Recent Labs  Lab 11/23/19 0649 11/24/19 0454 11/25/19 0502 11/26/19 0433 11/27/19 1133  NA 138 138 137 141 135  K 3.6 4.9 3.3* 5.1 3.5  CL 104 105 107 110 103  CO2 23 23 20* 22 22  GLUCOSE 112* 106* 92 99 92  BUN <5* 8 9 8  7*  CREATININE 0.61 0.63 0.62 0.62 0.51  CALCIUM 9.3 9.2 9.0 9.4 9.1  MG 1.6* 2.0 1.7 1.9 1.7  PHOS 3.3 3.0 4.4 4.1 3.5   Liver Function  Tests: Recent Labs  Lab 11/21/19 0237 11/22/19 0334 11/23/19 0649 11/24/19 0454 11/25/19 0502  AST 23 23 26 23 23   ALT 14 16 18 18 16   ALKPHOS 44 62 56 58 52  BILITOT 0.5 0.6 0.9 0.4 0.8  PROT 5.0* 5.4* 6.2* 6.1* 5.5*  ALBUMIN 2.7* 2.9* 3.3* 3.1* 2.9*   No results for input(s): LIPASE, AMYLASE in the last  168 hours. No results for input(s): AMMONIA in the last 168 hours. CBC: Recent Labs  Lab 11/23/19 0649 11/24/19 0454 11/25/19 0502 11/26/19 0433 11/27/19 1133  WBC 8.0 9.8 11.1* 12.0* 10.4  NEUTROABS 3.5 4.9 5.0 5.7 5.7  HGB 12.0 11.3* 10.7* 11.3* 12.2  HCT 36.0 33.7* 32.7* 34.0* 36.5  MCV 89.3 90.6 91.1 90.9 91.3  PLT 315 294 302 365 371   Cardiac Enzymes: No results for input(s): CKTOTAL, CKMB, CKMBINDEX, TROPONINI in the last 168 hours. BNP: Invalid input(s): POCBNP CBG: No results for input(s): GLUCAP in the last 168 hours. D-Dimer No results for input(s): DDIMER in the last 72 hours. Hgb A1c No results for input(s): HGBA1C in the last 72 hours. Lipid Profile No results for input(s): CHOL, HDL, LDLCALC, TRIG, CHOLHDL, LDLDIRECT in the last 72 hours. Thyroid function studies No results for input(s): TSH, T4TOTAL, T3FREE, THYROIDAB in the last 72 hours.  Invalid input(s): FREET3 Anemia work up No results for input(s): VITAMINB12, FOLATE, FERRITIN, TIBC, IRON, RETICCTPCT in the last 72 hours. Urinalysis    Component Value Date/Time   COLORURINE YELLOW 11/19/2019 1408   APPEARANCEUR CLEAR 11/19/2019 1408   LABSPEC 1.009 11/19/2019 1408   PHURINE 5.0 11/19/2019 1408   GLUCOSEU NEGATIVE 11/19/2019 1408   GLUCOSEU NEGATIVE 12/31/2011 1512   HGBUR NEGATIVE 11/19/2019 1408   BILIRUBINUR NEGATIVE 11/19/2019 1408   BILIRUBINUR small 02/02/2019 1036   KETONESUR 5 (A) 11/19/2019 1408   PROTEINUR NEGATIVE 11/19/2019 1408   UROBILINOGEN 1.0 02/02/2019 1036   UROBILINOGEN 0.2 12/31/2011 1512   NITRITE NEGATIVE 11/19/2019 1408   LEUKOCYTESUR TRACE (A) 11/19/2019  1408   Sepsis Labs Invalid input(s): PROCALCITONIN,  WBC,  LACTICIDVEN Microbiology Recent Results (from the past 240 hour(s))  Urine culture     Status: None   Collection Time: 11/19/19 12:20 PM   Specimen: Urine, Random  Result Value Ref Range Status   Specimen Description URINE, RANDOM  Final   Special Requests NONE  Final   Culture   Final    NO GROWTH Performed at [facility], [place] [address] [place], [place] [address]    Report Status 11/20/2019 FINAL  Final  SARS Coronavirus 2 by RT PCR (hospital order, performed in [facility] hospital lab) Nasopharyngeal Nasopharyngeal Swab     Status: None   Collection Time: 11/19/19 12:20 PM   Specimen: Nasopharyngeal Swab  Result Value Ref Range Status   SARS Coronavirus 2 NEGATIVE NEGATIVE Final    Comment: (NOTE) SARS-CoV-2 target nucleic acids are NOT DETECTED.  The SARS-CoV-2 RNA is generally detectable in upper and lower respiratory specimens during the acute phase of infection. The lowest concentration of SARS-CoV-2 viral copies this assay can detect is 250 copies / mL. A negative result does not preclude SARS-CoV-2 infection and should not be used as the sole basis for treatment or other patient management decisions.  A negative result may occur with improper specimen collection / handling, submission of specimen other than nasopharyngeal swab, presence of viral mutation(s) within the areas targeted by this assay, and inadequate number of viral copies (<250 copies / mL). A negative result must be combined with clinical observations, patient history, and epidemiological information.  Fact Sheet for Patients:   StrictlyIdeas.no  Fact Sheet for Healthcare Providers: BankingDealers.co.za  This test is not yet approved or  cleared by the [place] FDA and has been authorized for detection and/or diagnosis of SARS-CoV-2 by FDA under an Emergency Use Authorization (EUA).   This EUA  will remain in effect (meaning this test can be used) for the duration of the COVID-19 declaration under Section 564(b)(1) of the Act, 21 U.S.C. section 360bbb-3(b)(1), unless the authorization is terminated or revoked sooner.  Performed at [facility], [place] [address] [place], [place] [address]   Culture, blood (routine x 2)     Status: None   Collection Time: 11/19/19  8:00 PM   Specimen: BLOOD LEFT HAND  Result Value Ref Range Status   Specimen Description BLOOD LEFT HAND  Final   Special Requests   Final    BOTTLES DRAWN AEROBIC AND ANAEROBIC Blood Culture adequate volume   Culture   Final    NO GROWTH 5 DAYS Performed at [facility], [place] [address], [place], [place] [address]    Report Status 11/24/2019 FINAL  Final  Culture, blood (routine x 2)     Status: None   Collection Time: 11/19/19  9:00 PM   Specimen: BLOOD RIGHT HAND  Result Value Ref Range Status   Specimen Description BLOOD RIGHT HAND  Final   Special Requests   Final    BOTTLES DRAWN AEROBIC AND ANAEROBIC Blood Culture adequate volume   Culture   Final    NO GROWTH 5 DAYS Performed at [facility], [place] [address], [place], [place] [address]    Report Status 11/24/2019 FINAL  Final  Gastrointestinal Panel by PCR , Stool     Status: Abnormal   Collection Time: 11/20/19  3:56 PM   Specimen: STOOL  Result Value Ref Range Status   Campylobacter species DETECTED (A) NOT DETECTED Final    Comment: RESULT CALLED TO, READ BACK BY AND VERIFIED WITH: MELON BONADO @2011  11/20/19 MJU    Plesimonas shigelloides NOT DETECTED NOT DETECTED Final   Salmonella species NOT DETECTED NOT DETECTED Final   Yersinia enterocolitica NOT DETECTED NOT DETECTED Final   Vibrio species NOT DETECTED NOT DETECTED Final   Vibrio cholerae NOT DETECTED NOT DETECTED Final   Enteroaggregative E coli (EAEC) NOT DETECTED NOT DETECTED Final   Enteropathogenic E coli (EPEC) NOT DETECTED NOT DETECTED Final    Enterotoxigenic E coli (ETEC) NOT DETECTED NOT DETECTED Final   Shiga like toxin producing E coli (STEC) NOT DETECTED NOT DETECTED Final   Shigella/Enteroinvasive E coli (EIEC) NOT DETECTED NOT DETECTED Final   Cryptosporidium NOT DETECTED NOT DETECTED Final   Cyclospora cayetanensis NOT DETECTED NOT DETECTED Final   Entamoeba histolytica NOT DETECTED NOT DETECTED Final   Giardia lamblia NOT DETECTED NOT DETECTED Final   Adenovirus F40/41 NOT DETECTED NOT DETECTED Final   Astrovirus NOT DETECTED NOT DETECTED Final   Norovirus GI/GII NOT DETECTED NOT DETECTED Final   Rotavirus A NOT DETECTED NOT DETECTED Final   Sapovirus (I, II, IV, and V) NOT DETECTED NOT DETECTED Final    Comment: Performed at [facility], [place] [place], [place] [address]  C Difficile Quick Screen w PCR reflex     Status: Abnormal   Collection Time: 11/20/19  3:56 PM   Specimen: STOOL  Result Value Ref Range Status   C Diff antigen POSITIVE (A) NEGATIVE Final   C Diff toxin POSITIVE (A) NEGATIVE Final   C Diff interpretation Toxin producing C. difficile detected.  Final    Comment: CRITICAL RESULT CALLED TO, READ BACK BY AND VERIFIED WITH: RN ALA ABU AT 1724 11/20/19 BY MM Performed at [facility], [address], [place], [place] [address]  Time coordinating discharge: Over 30 minutes  SIGNED:   Nolberto Hanlon, MD  [facility] 11/27/2019, 4:22 PM Pager   If 7PM-7AM, please contact night-coverage [email or web address] Password [email or web address]

## 2019-11-29 ENCOUNTER — Telehealth: Payer: Self-pay | Admitting: Pharmacy Technician

## 2019-11-29 ENCOUNTER — Telehealth: Payer: Self-pay | Admitting: *Deleted

## 2019-11-29 NOTE — Telephone Encounter (Signed)
[facility] Patient Advocate Encounter  Completed and sent [employer] application for Dificid for this patient who cannot afford copay.    Faxed to [name] to get patient income information and signatures.  She is faxing to [employer].  [name]. [name] [place] Patient [facility] Phone: [phone] Fax:  [phone]

## 2019-11-29 NOTE — Telephone Encounter (Signed)
Transition Care Management Follow-Up Telephone Call   Date discharged and where: 11/27/2019 [facility]  How have you been since you were released from the hospital? Weak but getting better  Any patient concerns? No  Items Reviewed:   Meds: Yes  Allergies: Yes no new allergy  Dietary Changes Reviewed: Yes  Functional Questionnaire:  Independent-I Dependent-D  ADLs: I   Dressing- I    Eating-I   Maintaining continence-I   Transferring-I   Transportation-I   Meal Prep-I   Managing Meds- I  Confirmed importance and Date/Time of follow-up visits scheduled: 12/08/2019 with Janett Billow   Confirmed with patient if condition worsens to call PCP or go to the Emergency Dept. Patient was given office number and encouraged to call back with questions or concerns:  Yes

## 2019-12-05 ENCOUNTER — Telehealth: Payer: Self-pay | Admitting: Adult Health

## 2019-12-05 NOTE — Telephone Encounter (Signed)
I received a phone call from [name]. She has been in the hospital with cdiff and placed on Dificid. She has taken 8/10 days of therapy and the diarrhea has resolved. Unfortunately, she is itching all over and has a hx of numerous allergies. She called the pharmacy and they suggested benadryl and was calling to verify that she should try it. I recommend stopping the Dificid due to possible reaction and agreed that she could try a dose of Benadryl 25 mg for the itching but I did not recommend using long term due to s/e.  She also was started on Eliquis for CVA risk reduction associated with new onset afib. I recommended continuing this medication at this time.  If the itching continues she should call our office back for additional recommendations. If the diarrhea resumes she should also call our office back for additional recommendations. She verbalized understanding of the above and has an apt next week for hospital follow up. She is concerned about taking any more Dificid as the copay will be $1500 per her account.

## 2019-12-06 MED ORDER — PREDNISONE 20 MG PO TABS
20.0000 mg | ORAL_TABLET | Freq: Two times a day (BID) | ORAL | 0 refills | Status: DC
Start: 2019-12-06 — End: 2019-12-31

## 2019-12-06 NOTE — Telephone Encounter (Signed)
Low dose prednisone called into the pharmacy for her to take twice daily for 3 days to see if this helps improve symptoms of itching

## 2019-12-06 NOTE — Telephone Encounter (Signed)
Patient called and stated that she has an appointment on Wednesday with [name].   Stated that she is still Itching and has stopped the Dificid yesterday. Taking Benedryl every 4 hours with no relief.   Patient's diarrhea is much better, not runny anymore.   Patient is wondering if she needs to stop any of the other medications started.  Please Advise.

## 2019-12-06 NOTE — Telephone Encounter (Signed)
Patient notified and agreed.  

## 2019-12-06 NOTE — Telephone Encounter (Signed)
LMOM to return call.

## 2019-12-06 NOTE — Telephone Encounter (Signed)
Has the itching improved/worsened? Is there a rash or any other symptoms with the itching?  It appears eliquis is the only other new medication that was started correct?

## 2019-12-06 NOTE — Addendum Note (Signed)
Addended by: Lauree Chandler on: 12/06/2019 02:31 PM   Modules accepted: Orders

## 2019-12-06 NOTE — Telephone Encounter (Addendum)
Patient stated that the Itching is about the same and there is no other symptoms except for the itching all over and Benedryl is not working.  Stated that she is taking the Benedryl every 4 hours with no relief.   Has appointment on Wednesday.

## 2019-12-08 ENCOUNTER — Other Ambulatory Visit: Payer: Self-pay

## 2019-12-08 ENCOUNTER — Ambulatory Visit (INDEPENDENT_AMBULATORY_CARE_PROVIDER_SITE_OTHER): Payer: PPO | Admitting: Nurse Practitioner

## 2019-12-08 ENCOUNTER — Encounter: Payer: Self-pay | Admitting: Nurse Practitioner

## 2019-12-08 VITALS — BP 118/70 | HR 69 | Temp 97.5°F | Ht 61.0 in | Wt 146.0 lb

## 2019-12-08 DIAGNOSIS — M8580 Other specified disorders of bone density and structure, unspecified site: Secondary | ICD-10-CM | POA: Diagnosis not present

## 2019-12-08 DIAGNOSIS — A0472 Enterocolitis due to Clostridium difficile, not specified as recurrent: Secondary | ICD-10-CM | POA: Diagnosis not present

## 2019-12-08 DIAGNOSIS — I4891 Unspecified atrial fibrillation: Secondary | ICD-10-CM

## 2019-12-08 DIAGNOSIS — L299 Pruritus, unspecified: Secondary | ICD-10-CM | POA: Diagnosis not present

## 2019-12-08 DIAGNOSIS — I10 Essential (primary) hypertension: Secondary | ICD-10-CM | POA: Diagnosis not present

## 2019-12-08 DIAGNOSIS — K219 Gastro-esophageal reflux disease without esophagitis: Secondary | ICD-10-CM | POA: Diagnosis not present

## 2019-12-08 LAB — COMPLETE METABOLIC PANEL WITH GFR
AG Ratio: 2 (calc) (ref 1.0–2.5)
ALT: 17 U/L (ref 6–29)
AST: 23 U/L (ref 10–35)
Albumin: 4.5 g/dL (ref 3.6–5.1)
Alkaline phosphatase (APISO): 65 U/L (ref 37–153)
BUN/Creatinine Ratio: 14 (calc) (ref 6–22)
BUN: 13 mg/dL (ref 7–25)
CO2: 29 mmol/L (ref 20–32)
Calcium: 10.5 mg/dL — ABNORMAL HIGH (ref 8.6–10.4)
Chloride: 103 mmol/L (ref 98–110)
Creat: 0.94 mg/dL — ABNORMAL HIGH (ref 0.60–0.88)
GFR, Est African American: 66 mL/min/{1.73_m2} (ref >=60)
GFR, Est Non African American: 57 mL/min/{1.73_m2} — ABNORMAL LOW (ref >=60)
Globulin: 2.3 g/dL (calc) (ref 1.9–3.7)
Glucose, Bld: 98 mg/dL (ref 65–99)
Potassium: 4.6 mmol/L (ref 3.5–5.3)
Sodium: 139 mmol/L (ref 135–146)
Total Bilirubin: 0.8 mg/dL (ref 0.2–1.2)
Total Protein: 6.8 g/dL (ref 6.1–8.1)

## 2019-12-08 LAB — CBC WITH DIFFERENTIAL/PLATELET
Absolute Monocytes: 1025 cells/uL — ABNORMAL HIGH (ref 200–950)
Basophils Absolute: 55 cells/uL (ref 0–200)
Basophils Relative: 0.5 %
Eosinophils Absolute: 22 cells/uL (ref 15–500)
Eosinophils Relative: 0.2 %
HCT: 38.8 % (ref 35.0–45.0)
Hemoglobin: 13 g/dL (ref 11.7–15.5)
Lymphs Abs: 4088 cells/uL — ABNORMAL HIGH (ref 850–3900)
MCH: 30.2 pg (ref 27.0–33.0)
MCHC: 33.5 g/dL (ref 32.0–36.0)
MCV: 90.2 fL (ref 80.0–100.0)
MPV: 10.6 fL (ref 7.5–12.5)
Monocytes Relative: 9.4 %
Neutro Abs: 5712 cells/uL (ref 1500–7800)
Neutrophils Relative %: 52.4 %
Platelets: 432 10*3/uL — ABNORMAL HIGH (ref 140–400)
RBC: 4.3 10*6/uL (ref 3.80–5.10)
RDW: 13.5 % (ref 11.0–15.0)
Total Lymphocyte: 37.5 %
WBC: 10.9 10*3/uL — ABNORMAL HIGH (ref 3.8–10.8)

## 2019-12-08 NOTE — Patient Instructions (Signed)
Decrease nexium to once daily

## 2019-12-08 NOTE — Progress Notes (Signed)
Careteam: Patient Care Team: Carla Chandler, NP as PCP - General (Nurse Practitioner)  PLACE OF SERVICE:  [facility]  Advanced Directive information    Allergies  Allergen Reactions  . Ace Inhibitors     cough  . Amoxicillin Itching    REACTION: unknown? pain in right kidney  . Benicar Hct [Olmesartan Medoxomil-Hctz]     Extreme weakness  . Budesonide-Formoterol Fumarate     Causes tremors and numbness  . Chlorzoxazone [Chlorzoxazone]   . Citalopram Hydrobromide     REACTION: hives  . Citalopram Hydrobromide   . Clonidine Derivatives   . Cymbalta [Duloxetine Hcl]   . Droperidol     REACTION: hives  . Flexeril [Cyclobenzaprine Hcl]   . Fluconazole Nausea And Vomiting and Other (See Comments)    fatigue  . Gabapentin Itching  . Ketorolac Tromethamine     REACTION: hives  . Ketorolac Tromethamine   . Metoclopramide Hcl   . Mometasone Furo-Formoterol Fum     Causes sore throat, blurred vision and unable to sleep--started on med 09-17-10  . Morphine     REACTION: hives and itching  . Olmesartan Medoxomil     REACTION: fatique  . Breo Ellipta [Fluticasone Furoate-Vilanterol] Itching    Pt reports itching with [facility] [place] is related to being lactose intolerant.     Chief Complaint  Patient presents with  . Transitions Of Care    Hospitalization follow-up 7/9-7/17/2021  . Medication Management    Discuss blood thinner, patient questions why she is still on this medication   . Medication Management    Discuss medication for bones      HPI: Patient is a 79 y.o. female for hospitalize follow up. Pt with hxhistory of cirrhosis of the liver secondary to [facility], hypertension, asthma presented to the ER because of ongoing weakness for the last 1 week where patient was feeling intensely fatigued. Patient over the last 1 week he has been having increased frequency of urination and diarrhea. Diarrhea was watery.  Given the worsening symptoms patient presented to the ER.In  the ER patient is found to be febrile with a temperature 102 F and lab work show sodium 129 WBC 11.9 lactic acid was normal EKG shows normal sinus rhythm. Chest x-ray was unremarkable UA is going which is concerning for UTI. Given the symptoms patient had blood cultures drawn started on fluids empiric antibiotics admitted for further management.  Patient had complained of diarrhea and was febrile.  She was found with positive C. difficile stool test. Patient was started on oral vancomycin on July 10. GI pathogen panel was positive for Campylobacter. Since there was no improvement in her diarrhea and she was started on azithromycincompeleted 3 day dose She was loaded on Dificid and discharged on medication, once she got home she started having severe itching and stopped dificid. Her stools are now normal. No diarrhea noted.   Patient noted to have atrial fibrillation while she was here in the hospital. Apparently no similar history in the past. TSH was normal. Echocardiogram has been done as well. Shows normal systolic function. Some left atrial dilatation is noted.  Patient on propranolol which was continued. She is now on a/c with Eliquis and taking twice daily without complications. However very hesitant to continue due to history of nose bleeds. She denies palpitations, chest pains. No LE edema.   Was having increase in itching to neck, underarms, and truck. No rash, dificid was stopped but itching continued, she was placed  on prednisone 20 mg BID for 3 days. itching has now resolved.   GERD- improved, taking nexium twice daily. Reports since she has lost weight acid reflux is much better.   Review of Systems:  Review of Systems  Constitutional: Negative for chills, fever and weight loss.  HENT: Negative for tinnitus.   Respiratory: Negative for cough, sputum production and shortness of breath.   Cardiovascular: Negative for chest pain, palpitations and leg swelling.    Gastrointestinal: Negative for abdominal pain, constipation, diarrhea and heartburn.  Genitourinary: Negative for dysuria, frequency and urgency.  Musculoskeletal: Negative for back pain, falls, joint pain and myalgias.  Skin: Negative.   Neurological: Negative for dizziness and headaches.  Psychiatric/Behavioral: Negative for depression and memory loss. The patient does not have insomnia.     Past Medical History:  Diagnosis Date  . ALLERGIC RHINITIS   . Anxiety   . Asthma   . Blood type O+   . Cervical strain, acute   . Cirrhosis ([place])   . Colon polyp 2010   adenoma  . Diverticulosis of colon   . Dizziness   . Esophageal varices ([place])   . Fibromyalgia   . GERD (gastroesophageal reflux disease)   . History of nephrolithiasis   . Hypercholesterolemia    borderline  . Hypertension   . IBS (irritable bowel syndrome)   . Osteoarthritis   . Personal history of allergy to unspecified medicinal agent   . Postconcussion syndrome   . Vitamin D deficiency    Past Surgical History:  Procedure Laterality Date  . CHOLECYSTECTOMY, LAPAROSCOPIC  2004   for gallstone pancreatitis  . KNEE SURGERY Right   . KNEE SURGERY Left   . THYROID SURGERY     Social History:   reports that she quit smoking about 57 years ago. Her smoking use included cigarettes. She has a 16.00 pack-year smoking history. She has never used smokeless tobacco. She reports that she does not drink alcohol and does not use drugs.  Family History  Problem Relation Age of Onset  . Breast cancer Mother   . Alzheimer's disease Mother   . Heart disease Mother   . COPD Brother   . Heart disease Sister   . Breast cancer Sister 28  . Alcohol abuse Sister   . Ovarian cancer Paternal Grandmother   . Arthritis Other        Cousins   . COPD Cousin        Maternal side   . Heart attack Maternal Uncle     Medications: Patient's Medications  New Prescriptions   No medications on file  Previous Medications    ACETAMINOPHEN (TYLENOL) 500 MG TABLET    Take 1,000 mg by mouth 2 (two) times a day.   ADVAIR HFA 115-21 MCG/ACT INHALER    TAKE 2 PUFFS BY MOUTH TWICE A DAY   AMLODIPINE (NORVASC) 5 MG TABLET    Take 1 tablet (5 mg total) by mouth daily.   APIXABAN (ELIQUIS) 5 MG TABS TABLET    Take 1 tablet (5 mg total) by mouth 2 (two) times daily.   CALCIUM CARBONATE (TUMS - DOSED IN MG ELEMENTAL CALCIUM) 500 MG CHEWABLE TABLET    Chew 1 tablet by mouth as needed for indigestion or heartburn.   CALCIUM CARBONATE-VITAMIN D (CALCIUM 600+D) 600-400 MG-UNIT TABLET    Take 1 tablet by mouth 2 (two) times daily.   ESOMEPRAZOLE (NEXIUM) 40 MG CAPSULE    Take 1 tablet by mouth once or  twice daily as needed   LOSARTAN (COZAAR) 25 MG TABLET    TAKE 1 TABLET BY MOUTH EVERY DAY   MECLIZINE (ANTIVERT) 25 MG TABLET    TAKE 1 TABLET BY MOUTH 3 TIMES A DAY AS NEEDED FOR DIZZINESS OR NAUSEA   MULTIPLE VITAMINS-MINERALS (CENTRUM SILVER PO)    Take 1 tablet by mouth daily.    OMEGA-3 FATTY ACIDS (FISH OIL MAXIMUM STRENGTH) 1200 MG CAPS    Take 1,200 mg by mouth 2 (two) times daily.    ONDANSETRON (ZOFRAN ODT) 4 MG DISINTEGRATING TABLET    Take 1 tablet (4 mg total) by mouth every 8 (eight) hours as needed for nausea or vomiting.   PREDNISONE (DELTASONE) 20 MG TABLET    Take 1 tablet (20 mg total) by mouth 2 (two) times daily with a meal.   PROAIR HFA 108 (90 BASE) MCG/ACT INHALER    INHALE 2 PUFFS EVERY 4 HOURS AS NEEDED INTO THE LUNGS FOR WHEEZING OR SHORTNESS OF BREATH   PROPRANOLOL (INDERAL) 10 MG TABLET    Take 1 tablet (10 mg total) by mouth 2 (two) times daily. You are due for an office visit with Dr. Havery Moros. Please call to schedule. Thank you   PROPYLENE GLYCOL (SYSTANE BALANCE) 0.6 % SOLN    Place 1 drop into both ears 2 (two) times daily.  Modified Medications   No medications on file  Discontinued Medications   SACCHAROMYCES BOULARDII (FLORASTOR) 250 MG CAPSULE    Take 1 capsule (250 mg total) by mouth 2 (two)  times daily.    Physical Exam:  Vitals:   12/08/19 1126  BP: 118/70  Pulse: 69  Temp: (!) 97.5 F (36.4 C)  TempSrc: Temporal  SpO2: 96%  Weight: 146 lb (66.2 kg)  Height: 5' 1"  (1.549 m)   Body mass index is 27.59 kg/m. Wt Readings from Last 3 Encounters:  12/08/19 146 lb (66.2 kg)  11/26/19 148 lb 14.4 oz (67.5 kg)  11/04/19 153 lb 12.8 oz (69.8 kg)    Physical Exam Constitutional:      General: She is not in acute distress.    Appearance: She is well-developed. She is not diaphoretic.  HENT:     Head: Normocephalic and atraumatic.     Mouth/Throat:     Pharynx: No oropharyngeal exudate.  Eyes:     Conjunctiva/sclera: Conjunctivae normal.     Pupils: Pupils are equal, round, and reactive to light.  Cardiovascular:     Rate and Rhythm: Normal rate and regular rhythm.     Heart sounds: Normal heart sounds.  Pulmonary:     Effort: Pulmonary effort is normal.     Breath sounds: Normal breath sounds.  Abdominal:     General: Bowel sounds are normal.     Palpations: Abdomen is soft.  Musculoskeletal:        General: No tenderness.     Cervical back: Normal range of motion and neck supple.  Skin:    General: Skin is warm and dry.  Neurological:     Mental Status: She is alert and oriented to person, place, and time.     Gait: Gait normal.  Psychiatric:        Mood and Affect: Mood normal.        Behavior: Behavior normal.     Labs reviewed: Basic Metabolic Panel: Recent Labs    02/02/19 1122 07/07/19 1113 11/22/19 1433 11/23/19 0649 11/25/19 0502 11/26/19 0433 11/27/19 1133  NA 138   < >  --    < >  137 141 135  K 4.1   < >  --    < > 3.3* 5.1 3.5  CL 99   < >  --    < > 107 110 103  CO2 31   < >  --    < > 20* 22 22  GLUCOSE 114*   < >  --    < > 92 99 92  BUN 16   < >  --    < > 9 8 7*  CREATININE 0.66   < >  --    < > 0.62 0.62 0.51  CALCIUM 10.0   < >  --    < > 9.0 9.4 9.1  MG  --    < >  --    < > 1.7 1.9 1.7  PHOS  --    < >  --    < >  4.4 4.1 3.5  TSH 1.69  --  1.666  --   --   --   --    < > = values in this interval not displayed.   Liver Function Tests: Recent Labs    11/23/19 0649 11/24/19 0454 11/25/19 0502  AST 26 23 23   ALT 18 18 16   ALKPHOS 56 58 52  BILITOT 0.9 0.4 0.8  PROT 6.2* 6.1* 5.5*  ALBUMIN 3.3* 3.1* 2.9*   Recent Labs    11/19/19 1121  LIPASE 35   No results for input(s): AMMONIA in the last 8760 hours. CBC: Recent Labs    11/25/19 0502 11/26/19 0433 11/27/19 1133  WBC 11.1* 12.0* 10.4  NEUTROABS 5.0 5.7 5.7  HGB 10.7* 11.3* 12.2  HCT 32.7* 34.0* 36.5  MCV 91.1 90.9 91.3  PLT 302 365 371   Lipid Panel: Recent Labs    02/02/19 1122 07/07/19 1113 10/22/19 0904  CHOL 201* 231* 230*  HDL 46* 50 49*  LDLCALC 118* 151* 156*  TRIG 259* 163* 130  CHOLHDL 4.4 4.6 4.7   TSH: Recent Labs    02/02/19 1122 11/22/19 1433  TSH 1.69 1.666   A1C: Lab Results  Component Value Date   HGBA1C 5.7 (H) 07/07/2019     Assessment/Plan 1. New onset atrial fibrillation (HCC) -regular at this time. Rate controlled and continues on eliquis for anticoagulation.  - CBC with Differential/Platelet - Ambulatory referral to Cardiology for further evaluation. Question for long term eliquis as she is now back in SR.  2. C. difficile colitis Unable to complete 10 day course of dificid but completed 7 days. No longer having diarrhea. Stools normal. Strength and appetite have increased.  - CBC with Differential/Platelet - COMPLETE METABOLIC PANEL WITH GFR  3. Itching -resolved on prednisone.   4. Essential hypertension Stable. Well controlled on   5. Gastroesophageal reflux disease without esophagitis -improved, will have her decrease nexium daily to see if this still controls symptoms.  6. Osteopenia, unspecified location -continue on cal and vit d with weight bearing activity, has follow up bone density scheduled.   Next appt: 05/08/2020 as scheduled., sooner if needed Carlos American.  [place], [facility] [phone]

## 2019-12-09 ENCOUNTER — Other Ambulatory Visit: Payer: Self-pay

## 2019-12-09 DIAGNOSIS — D691 Qualitative platelet defects: Secondary | ICD-10-CM

## 2019-12-09 NOTE — Progress Notes (Signed)
This encounter was created in error - please disregard.

## 2019-12-10 ENCOUNTER — Other Ambulatory Visit: Payer: Self-pay

## 2019-12-16 ENCOUNTER — Other Ambulatory Visit: Payer: PPO

## 2019-12-17 ENCOUNTER — Other Ambulatory Visit: Payer: Self-pay

## 2019-12-17 DIAGNOSIS — D691 Qualitative platelet defects: Secondary | ICD-10-CM

## 2019-12-19 ENCOUNTER — Other Ambulatory Visit: Payer: Self-pay | Admitting: Nurse Practitioner

## 2019-12-19 DIAGNOSIS — I10 Essential (primary) hypertension: Secondary | ICD-10-CM

## 2019-12-21 ENCOUNTER — Other Ambulatory Visit: Payer: Self-pay

## 2019-12-21 ENCOUNTER — Ambulatory Visit
Admission: RE | Admit: 2019-12-21 | Discharge: 2019-12-21 | Disposition: A | Discharge status: Home / Self Care | Payer: PPO | Source: Ambulatory Visit | Attending: Nurse Practitioner | Admitting: Nurse Practitioner

## 2019-12-21 DIAGNOSIS — M8589 Other specified disorders of bone density and structure, multiple sites: Secondary | ICD-10-CM | POA: Diagnosis not present

## 2019-12-21 DIAGNOSIS — Z780 Asymptomatic menopausal state: Secondary | ICD-10-CM | POA: Diagnosis not present

## 2019-12-21 DIAGNOSIS — E2839 Other primary ovarian failure: Secondary | ICD-10-CM

## 2019-12-21 DIAGNOSIS — D691 Qualitative platelet defects: Secondary | ICD-10-CM | POA: Diagnosis not present

## 2019-12-21 LAB — CBC WITH DIFFERENTIAL/PLATELET
Absolute Monocytes: 713 cells/uL (ref 200–950)
Basophils Absolute: 49 cells/uL (ref 0–200)
Basophils Relative: 0.6 %
Eosinophils Absolute: 73 cells/uL (ref 15–500)
Eosinophils Relative: 0.9 %
HCT: 39.5 % (ref 35.0–45.0)
Hemoglobin: 13.2 g/dL (ref 11.7–15.5)
Lymphs Abs: 3208 cells/uL (ref 850–3900)
MCH: 30.4 pg (ref 27.0–33.0)
MCHC: 33.4 g/dL (ref 32.0–36.0)
MCV: 91 fL (ref 80.0–100.0)
MPV: 10.4 fL (ref 7.5–12.5)
Monocytes Relative: 8.8 %
Neutro Abs: 4058 cells/uL (ref 1500–7800)
Neutrophils Relative %: 50.1 %
Platelets: 324 10*3/uL (ref 140–400)
RBC: 4.34 10*6/uL (ref 3.80–5.10)
RDW: 13.3 % (ref 11.0–15.0)
Total Lymphocyte: 39.6 %
WBC: 8.1 10*3/uL (ref 3.8–10.8)

## 2019-12-22 ENCOUNTER — Telehealth: Payer: Self-pay | Admitting: *Deleted

## 2019-12-22 NOTE — Telephone Encounter (Signed)
Patient notified and agreed.  

## 2019-12-22 NOTE — Telephone Encounter (Signed)
We did not recheck calcium, I will recheck that with next follow up.

## 2019-12-22 NOTE — Telephone Encounter (Signed)
Patient called and wanted to know since her bloodwork came back normal yesterday  if it was ok to continue the Calcium the way she has been doing it for years.   Please Advise.    12/08/2019 Labwork: (per Janett Billow) The platelets have to do with clotting, unsure why they are elevated, we can follow up CBC in 1 week to make sure they normalize.  To stop MVI for now if she is getting nutrients through proper diet this is okay. Would stop calcium until directed otherwise If she is not having diarrhea she is not contagious

## 2019-12-23 ENCOUNTER — Other Ambulatory Visit: Payer: PPO

## 2019-12-30 ENCOUNTER — Other Ambulatory Visit: Payer: Self-pay | Admitting: Nurse Practitioner

## 2019-12-30 DIAGNOSIS — I10 Essential (primary) hypertension: Secondary | ICD-10-CM

## 2019-12-31 ENCOUNTER — Encounter: Payer: Self-pay | Admitting: Gastroenterology

## 2019-12-31 ENCOUNTER — Ambulatory Visit: Payer: PPO | Admitting: Gastroenterology

## 2019-12-31 VITALS — BP 122/60 | HR 84 | Ht 61.0 in | Wt 139.8 lb

## 2019-12-31 DIAGNOSIS — K219 Gastro-esophageal reflux disease without esophagitis: Secondary | ICD-10-CM | POA: Diagnosis not present

## 2019-12-31 DIAGNOSIS — I8500 Esophageal varices without bleeding: Secondary | ICD-10-CM

## 2019-12-31 DIAGNOSIS — Z8619 Personal history of other infectious and parasitic diseases: Secondary | ICD-10-CM | POA: Diagnosis not present

## 2019-12-31 DIAGNOSIS — K7460 Unspecified cirrhosis of liver: Secondary | ICD-10-CM | POA: Diagnosis not present

## 2019-12-31 MED ORDER — ESOMEPRAZOLE MAGNESIUM 20 MG PO CPDR: 20.0000 mg | DELAYED_RELEASE_CAPSULE | Freq: Every day | ORAL | 3 refills | Status: DC

## 2019-12-31 NOTE — Progress Notes (Signed)
HPI :  79 y/o female here for a follow up visit for the following:  Cirrhosis - compensated. Diagnosed in 06/2017 when she had incidental finding of esophageal varices on EGD done to evaluate history of possible Barrett's. Suspected to be due to NASH given negative workup otherwise.  No decompensations over time.  She has been on low-dose propranolol due to a low resting heart rate in the 50s to 60s.  She has not had any problems with ascites, no evidence of hepatic encephalopathy or jaundice.  She is worked really hard at weight loss with diet, she started over 200 pounds and is now down to 139.  She states this is intentional weight loss.  She has been immunized hepatitis A.  Her last [place] screening was performed in February 2021 with stable changes of her liver.  C diff / GERD -unfortunately she was hospitalized last month for diarrhea and tested positive for both C. difficile and Campylobacter for which she was treated.  Initially given oral vancomycin, symptoms persisted and was treated for Campylobacter with azithromycin.  ID was consulted and started Dificid and told her to take that for 10 days. Course complicated by new onset A fibb and started on anticoagulation, she is currently taking Eliquis.  She is tolerating this well without any blood in her stools.  No abdominal pains.  She does have occasional abdominal bloating but not routine.  She has been taking Nexium 40 mg once daily for reflux and states this is been doing very well for her.  Previously she was on higher dose of twice a day for refractory symptoms in the past, however was able to dose reduce to 40 mg several months ago.  She continues to do well in this regard.  We discussed long-term management of her PPI in light of her recent C. difficile.   Prior workup:  [place] 11/20/19 - no ascites  [place] 06/29/19 - stable changes  [place] 06/25/18 - stable changes  EGD 06/25/2017 - No evidence of Barrett's, diminutive white plaques in the  esophagus c/w candidiasis, suspected varices in mid esophagus, multiple gastric polyps, normal stomach / duodenum - fundic gland polyps  Colonoscopy 06/25/2017 - 9 small adenomas, afew small-mouthed diverticula were found in the sigmoid colon and ascending colon,Internal hemorrhoids were found during retroflexion. The hemorrhoids were moderate.  Colonoscopy 07/28/2008 - diverticulosis, one small adenoma, biopsies taken to rule out microscopic colitis and negative  EGD 9/209/2006 - 2cm segment of possible BE - bx show reflux changes only, no BE  CBC on 8/10 looked normal, LFTS normal INR 1.0, AFP 4.6 on 07/13/19  Echo 11/23/19 - EF 60-65%     Past Medical History:  Diagnosis Date  . ALLERGIC RHINITIS   . Anxiety   . Asthma   . Blood type O+   . Cervical strain, acute   . Cirrhosis ([place])   . Colon polyp 2010   adenoma  . Diverticulosis of colon   . Dizziness   . Esophageal varices ([place])   . Fibromyalgia   . GERD (gastroesophageal reflux disease)   . History of nephrolithiasis   . Hypercholesterolemia    borderline  . Hypertension   . IBS (irritable bowel syndrome)   . Osteoarthritis   . Personal history of allergy to unspecified medicinal agent   . Postconcussion syndrome   . Vitamin D deficiency      Past Surgical History:  Procedure Laterality Date  . CHOLECYSTECTOMY, LAPAROSCOPIC  2004   for gallstone  pancreatitis  . KNEE SURGERY Right   . KNEE SURGERY Left   . THYROID SURGERY     cyst removed   Family History  Problem Relation Age of Onset  . Breast cancer Mother   . Alzheimer's disease Mother   . COPD Brother   . Alcohol abuse Brother   . Heart disease Sister   . Breast cancer Sister 27  . Alcohol abuse Sister   . Ovarian cancer Paternal Grandmother   . Arthritis Other        Cousins   . COPD Cousin        Maternal side   . Heart attack Maternal Uncle   . Colon cancer Neg Hx   . Esophageal cancer Neg Hx    Social History   Tobacco Use  .  Smoking status: Former Smoker    Packs/day: 2.00    Years: 8.00    Pack years: 16.00    Types: Cigarettes    Quit date: 05/13/1962    Years since quitting: 57.6  . Smokeless tobacco: Never Used  Vaping Use  . Vaping Use: Never used  Substance Use Topics  . Alcohol use: No  . Drug use: No   Current Outpatient Medications  Medication Sig Dispense Refill  . acetaminophen (TYLENOL) 500 MG tablet Take 1,000 mg by mouth 2 (two) times a day.    Marland Kitchen ADVAIR HFA 115-21 MCG/ACT inhaler TAKE 2 PUFFS BY MOUTH TWICE A DAY 12 Inhaler 5  . amLODipine (NORVASC) 5 MG tablet TAKE 1 TABLET BY MOUTH EVERY DAY 30 tablet 0  . ELIQUIS 5 MG TABS tablet TAKE 1 TABLET BY MOUTH TWICE A DAY 60 tablet 0  . esomeprazole (NEXIUM) 40 MG capsule Take 1 tablet by mouth once or twice daily as needed (Patient taking differently: Take 1 tablet by mouth daily) 180 capsule 1  . losartan (COZAAR) 25 MG tablet TAKE 1 TABLET BY MOUTH EVERY DAY 90 tablet 1  . meclizine (ANTIVERT) 25 MG tablet TAKE 1 TABLET BY MOUTH 3 TIMES A DAY AS NEEDED FOR DIZZINESS OR NAUSEA 90 tablet 0  . Omega-3 Fatty Acids (FISH OIL MAXIMUM STRENGTH) 1200 MG CAPS Take 1,200 mg by mouth 2 (two) times daily.     Marland Kitchen PROAIR HFA 108 (90 Base) MCG/ACT inhaler INHALE 2 PUFFS EVERY 4 HOURS AS NEEDED INTO THE LUNGS FOR WHEEZING OR SHORTNESS OF BREATH 25.5 Inhaler 1  . propranolol (INDERAL) 10 MG tablet Take 1 tablet (10 mg total) by mouth 2 (two) times daily. You are due for an office visit with Dr. Havery Moros. Please call to schedule. Thank you 180 tablet 0  . Propylene Glycol (SYSTANE BALANCE) 0.6 % SOLN Place 1 drop into both ears 2 (two) times daily. 15 mL 0   No current facility-administered medications for this visit.   Allergies  Allergen Reactions  . Ace Inhibitors     cough  . Amoxicillin Itching    REACTION: unknown? pain in right kidney  . Benicar Hct [Olmesartan Medoxomil-Hctz]     Extreme weakness  . Budesonide-Formoterol Fumarate     Causes  tremors and numbness  . Chlorzoxazone [Chlorzoxazone]   . Citalopram Hydrobromide     REACTION: hives  . Citalopram Hydrobromide   . Clonidine Derivatives   . Cymbalta [Duloxetine Hcl]   . Droperidol     REACTION: hives  . Flexeril [Cyclobenzaprine Hcl]   . Fluconazole Nausea And Vomiting and Other (See Comments)    fatigue  . Gabapentin Itching  .  Ketorolac Tromethamine     REACTION: hives  . Ketorolac Tromethamine   . Metoclopramide Hcl   . Mometasone Furo-Formoterol Fum     Causes sore throat, blurred vision and unable to sleep--started on med 09-17-10  . Morphine     REACTION: hives and itching  . Olmesartan Medoxomil     REACTION: fatique  . Breo Ellipta [Fluticasone Furoate-Vilanterol] Itching    Pt reports itching with Memory Dance is related to being lactose intolerant.      Review of Systems: All systems reviewed and negative except where noted in HPI.      Physical Exam: BP 122/60   Pulse 84   Ht 5' 1"  (1.549 m)   Wt 139 lb 12.8 oz (63.4 kg)   BMI 26.41 kg/m  Constitutional: Pleasant,well-developed, female in no acute distress. Neurological: Alert and oriented to person place and time. Skin: Skin is warm and dry. No rashes noted. Psychiatric: Normal mood and affect. Behavior is normal.   ASSESSMENT AND PLAN: 79 year old female here for reassessment of the following:  Cirrhosis / esophageal varices - as above suspect cirrhosis secondary to [place].  With diet and exercise she has done a really good job losing weight and normalizing her body mass index.  Her labs look good.  She is on propranolol at a low dose and tolerating it well.  We have not escalated to higher dosing due to her resting low heart rate.  She has had no decompensating events since her diagnosis.  She is due for [facility] screening at this time with another ultrasound, however due to her insurance and cost of the exam she is hoping to delay this until January, I think that is safe to do that.  Her labs are  otherwise up-to-date.  She is avoiding alcohol.  We discussed that she is now on anticoagulation that may increase her risk for rebleeding from the varices, she will need to be mindful of any bleeding symptoms that bother her.  Otherwise continue to see me every 6 months, stable at this time.  GERD / history of C. Difficile - previously had required high-dose PPI to control symptoms of reflux.  She has since been able to decrease to 40 mg of Nexium once daily and continues to do really well.  She previously had cough associated with this and no longer bothering her as well.  I suspect her continued weight loss has helped to reduce her reflux symptoms.  Given her recent history of C. difficile, we want to minimize PPI use if possible.  I will transition her to 20 mg a day of Nexium for now and see how she does with this.  If she feels that her reflux is really bothering her in the situation she will let me know.  Otherwise if she can wean down and hopefully use this as needed moving forward that would be best.  She understands she is at risk for recurrent C. difficile and if she has any recurrent diarrhea she should contact me.  Otherwise in light of her osteopenia also a good idea to try to get her off PPI if possible.  Crisman Cellar, MD [facility]

## 2019-12-31 NOTE — Patient Instructions (Addendum)
If you are age 81 or older, your body mass index should be between 23-30. Your Body mass index is 26.41 kg/m. If this is out of the aforementioned range listed, please consider follow up with your Primary Care Provider.  If you are age 1 or younger, your body mass index should be between 19-25. Your Body mass index is 26.41 kg/m. If this is out of the aformentioned range listed, please consider follow up with your Primary Care Provider.   We have sent the following medications to your pharmacy for you to pick up at your convenience: Nexium 20 mg: Once daily  (You can also purchase this over the counter if it is less expensive).   You will be due for an Ultrasound of your liver in January of 2022. We will call you when it is time to schedule.   You will be due for an office visit in 6 months (February of 2022).  We will reminder you when it is time to schedule your appointment.    Thank you for entrusting me with your care and for choosing [facility], Dr. Rich Cellar

## 2020-01-10 ENCOUNTER — Telehealth: Payer: Self-pay | Admitting: *Deleted

## 2020-01-10 MED ORDER — APIXABAN 5 MG PO TABS: 5.0000 mg | ORAL_TABLET | Freq: Two times a day (BID) | ORAL | 0 refills | Status: DC

## 2020-01-10 NOTE — Telephone Encounter (Signed)
Patient requested samples of Eliquis 68m. Stated that they are expensive.  Samples given. 4 packs.

## 2020-01-11 ENCOUNTER — Other Ambulatory Visit: Payer: Self-pay | Admitting: Nurse Practitioner

## 2020-01-11 ENCOUNTER — Other Ambulatory Visit: Payer: PPO

## 2020-01-11 ENCOUNTER — Other Ambulatory Visit: Payer: Self-pay

## 2020-01-11 ENCOUNTER — Telehealth (INDEPENDENT_AMBULATORY_CARE_PROVIDER_SITE_OTHER): Payer: PPO | Admitting: Nurse Practitioner

## 2020-01-11 ENCOUNTER — Other Ambulatory Visit: Payer: Self-pay | Admitting: Gastroenterology

## 2020-01-11 DIAGNOSIS — K5904 Chronic idiopathic constipation: Secondary | ICD-10-CM

## 2020-01-11 DIAGNOSIS — I4891 Unspecified atrial fibrillation: Secondary | ICD-10-CM

## 2020-01-11 DIAGNOSIS — R195 Other fecal abnormalities: Secondary | ICD-10-CM | POA: Diagnosis not present

## 2020-01-11 LAB — CBC WITH DIFFERENTIAL/PLATELET
Absolute Monocytes: 583 cells/uL (ref 200–950)
Basophils Absolute: 41 cells/uL (ref 0–200)
Basophils Relative: 0.5 %
Eosinophils Absolute: 81 cells/uL (ref 15–500)
Eosinophils Relative: 1 %
HCT: 40.7 % (ref 35.0–45.0)
Hemoglobin: 13.6 g/dL (ref 11.7–15.5)
Lymphs Abs: 3799 cells/uL (ref 850–3900)
MCH: 30.3 pg (ref 27.0–33.0)
MCHC: 33.4 g/dL (ref 32.0–36.0)
MCV: 90.6 fL (ref 80.0–100.0)
MPV: 10.3 fL (ref 7.5–12.5)
Monocytes Relative: 7.2 %
Neutro Abs: 3596 cells/uL (ref 1500–7800)
Neutrophils Relative %: 44.4 %
Platelets: 385 10*3/uL (ref 140–400)
RBC: 4.49 10*6/uL (ref 3.80–5.10)
RDW: 13.3 % (ref 11.0–15.0)
Total Lymphocyte: 46.9 %
WBC: 8.1 10*3/uL (ref 3.8–10.8)

## 2020-01-11 NOTE — Addendum Note (Signed)
Addended by: Logan Bores on: 01/11/2020 10:25 AM   Modules accepted: Orders

## 2020-01-11 NOTE — Progress Notes (Signed)
This service is provided via telemedicine  No vital signs collected/recorded due to the encounter was a telemedicine visit.   Location of patient (ex: home, work):  Home  Patient consents to a telephone visit:  Yes, see encounter dated 10/19/2019  Location of the provider (ex: office, home):  [place]  Name of any referring provider:  N/A  Names of all persons participating in the telemedicine service and their role in the encounter:  Sherrie Mustache, Nurse Practitioner, [name], CMA, and patient.   Time spent on call:  10 minutes with medical assistant      Careteam: Patient Care Team: Lauree Chandler, NP as PCP - General (Nurse Practitioner)    Advanced Directive information    Allergies  Allergen Reactions  . Ace Inhibitors     cough  . Amoxicillin Itching    REACTION: unknown? pain in right kidney  . Benicar Hct [Olmesartan Medoxomil-Hctz]     Extreme weakness  . Budesonide-Formoterol Fumarate     Causes tremors and numbness  . Chlorzoxazone [Chlorzoxazone]   . Citalopram Hydrobromide     REACTION: hives  . Citalopram Hydrobromide   . Clonidine Derivatives   . Cymbalta [Duloxetine Hcl]   . Droperidol     REACTION: hives  . Flexeril [Cyclobenzaprine Hcl]   . Fluconazole Nausea And Vomiting and Other (See Comments)    fatigue  . Gabapentin Itching  . Ketorolac Tromethamine     REACTION: hives  . Ketorolac Tromethamine   . Metoclopramide Hcl   . Mometasone Furo-Formoterol Fum     Causes sore throat, blurred vision and unable to sleep--started on med 09-17-10  . Morphine     REACTION: hives and itching  . Olmesartan Medoxomil     REACTION: fatique  . Breo Ellipta [Fluticasone Furoate-Vilanterol] Itching    Pt reports itching with Memory Dance is related to being lactose intolerant.     Chief Complaint  Patient presents with  . Acute Visit    Video visit to discuss dark green(blackish) bowel movements with diarrhea and concerns  about C diff. Patent states that it seems to have gotten darker over the past month, since starting medication for C diff.     HPI: Patient is a 79 y.o. female due to dark stool. Reports over the weekend had dark green stools, but the last 2 BMs were brown.   Reports yesterday she ate a lot and now feels constipation. And worried about recurrent C. Diff.   Reports she is concerned due to taking the eliqus because of the changes in her stool color.   No fever or chills. Feels well.   Review of Systems:  Review of Systems  Constitutional: Negative for chills, fever and weight loss.  Respiratory: Negative for cough, sputum production and shortness of breath.   Cardiovascular: Negative for chest pain, palpitations and leg swelling.  Gastrointestinal: Positive for constipation (today). Negative for abdominal pain, diarrhea and heartburn.  Genitourinary: Negative for dysuria, frequency and urgency.  Musculoskeletal: Negative for back pain, joint pain and myalgias.  Skin: Negative.   Neurological: Negative for dizziness, weakness and headaches.    Past Medical History:  Diagnosis Date  . ALLERGIC RHINITIS   . Anxiety   . Asthma   . Blood type O+   . Cervical strain, acute   . Cirrhosis ([place])   . Colon polyp 2010   adenoma  . Diverticulosis of colon   . Dizziness   . Esophageal varices ([place])   .  Fibromyalgia   . GERD (gastroesophageal reflux disease)   . History of nephrolithiasis   . Hypercholesterolemia    borderline  . Hypertension   . IBS (irritable bowel syndrome)   . Osteoarthritis   . Personal history of allergy to unspecified medicinal agent   . Postconcussion syndrome   . Vitamin D deficiency    Past Surgical History:  Procedure Laterality Date  . CHOLECYSTECTOMY, LAPAROSCOPIC  2004   for gallstone pancreatitis  . KNEE SURGERY Right   . KNEE SURGERY Left   . THYROID SURGERY     cyst removed   Social History:   reports that she quit smoking about 57 years  ago. Her smoking use included cigarettes. She has a 16.00 pack-year smoking history. She has never used smokeless tobacco. She reports that she does not drink alcohol and does not use drugs.  Family History  Problem Relation Age of Onset  . Breast cancer Mother   . Alzheimer's disease Mother   . COPD Brother   . Alcohol abuse Brother   . Heart disease Sister   . Breast cancer Sister 75  . Alcohol abuse Sister   . Ovarian cancer Paternal Grandmother   . Arthritis Other        Cousins   . COPD Cousin        Maternal side   . Heart attack Maternal Uncle   . Colon cancer Neg Hx   . Esophageal cancer Neg Hx     Medications: Patient's Medications  New Prescriptions   No medications on file  Previous Medications   ACETAMINOPHEN (TYLENOL) 500 MG TABLET    Take 1,000 mg by mouth 2 (two) times a day.   ADVAIR HFA 115-21 MCG/ACT INHALER    TAKE 2 PUFFS BY MOUTH TWICE A DAY   AMLODIPINE (NORVASC) 5 MG TABLET    TAKE 1 TABLET BY MOUTH EVERY DAY   APIXABAN (ELIQUIS) 5 MG TABS TABLET    Take 1 tablet (5 mg total) by mouth 2 (two) times daily.   ESOMEPRAZOLE (NEXIUM) 20 MG CAPSULE    Take 1 capsule (20 mg total) by mouth daily.   LOSARTAN (COZAAR) 25 MG TABLET    TAKE 1 TABLET BY MOUTH EVERY DAY   MECLIZINE (ANTIVERT) 25 MG TABLET    TAKE 1 TABLET BY MOUTH 3 TIMES A DAY AS NEEDED FOR DIZZINESS OR NAUSEA   OMEGA-3 FATTY ACIDS (FISH OIL MAXIMUM STRENGTH) 1200 MG CAPS    Take 1,200 mg by mouth 2 (two) times daily.    PROAIR HFA 108 (90 BASE) MCG/ACT INHALER    INHALE 2 PUFFS EVERY 4 HOURS AS NEEDED INTO THE LUNGS FOR WHEEZING OR SHORTNESS OF BREATH   PROPRANOLOL (INDERAL) 10 MG TABLET    Take 1 tablet (10 mg total) by mouth 2 (two) times daily. You are due for an office visit with Dr. Havery Moros. Please call to schedule. Thank you   PROPYLENE GLYCOL (SYSTANE BALANCE) 0.6 % SOLN    Place 1 drop into both ears 2 (two) times daily.  Modified Medications   No medications on file  Discontinued  Medications   No medications on file    Physical Exam:  There were no vitals filed for this visit. There is no height or weight on file to calculate BMI. Wt Readings from Last 3 Encounters:  12/31/19 139 lb 12.8 oz (63.4 kg)  12/08/19 146 lb (66.2 kg)  11/26/19 148 lb 14.4 oz (67.5 kg)  Labs reviewed: Basic Metabolic Panel: Recent Labs    02/02/19 1122 07/07/19 1113 11/22/19 1433 11/23/19 0649 11/25/19 0502 11/25/19 0502 11/26/19 0433 11/27/19 1133 12/08/19 1154  NA 138   < >  --    < > 137   < > 141 135 139  K 4.1   < >  --    < > 3.3*   < > 5.1 3.5 4.6  CL 99   < >  --    < > 107   < > 110 103 103  CO2 31   < >  --    < > 20*   < > 22 22 29   GLUCOSE 114*   < >  --    < > 92   < > 99 92 98  BUN 16   < >  --    < > 9   < > 8 7* 13  CREATININE 0.66   < >  --    < > 0.62   < > 0.62 0.51 0.94*  CALCIUM 10.0   < >  --    < > 9.0   < > 9.4 9.1 10.5*  MG  --    < >  --    < > 1.7  --  1.9 1.7  --   PHOS  --    < >  --    < > 4.4  --  4.1 3.5  --   TSH 1.69  --  1.666  --   --   --   --   --   --    < > = values in this interval not displayed.   Liver Function Tests: Recent Labs    11/23/19 0649 11/23/19 0649 11/24/19 0454 11/25/19 0502 12/08/19 1154  AST 26   < > 23 23 23   ALT 18   < > 18 16 17   ALKPHOS 56  --  58 52  --   BILITOT 0.9   < > 0.4 0.8 0.8  PROT 6.2*   < > 6.1* 5.5* 6.8  ALBUMIN 3.3*  --  3.1* 2.9*  --    < > = values in this interval not displayed.   Recent Labs    11/19/19 1121  LIPASE 35   No results for input(s): AMMONIA in the last 8760 hours. CBC: Recent Labs    11/27/19 1133 12/08/19 1154 12/21/19 1052  WBC 10.4 10.9* 8.1  NEUTROABS 5.7 5,712 4,058  HGB 12.2 13.0 13.2  HCT 36.5 38.8 39.5  MCV 91.3 90.2 91.0  PLT 371 432* 324   Lipid Panel: Recent Labs    02/02/19 1122 07/07/19 1113 10/22/19 0904  CHOL 201* 231* 230*  HDL 46* 50 49*  LDLCALC 118* 151* 156*  TRIG 259* 163* 130  CHOLHDL 4.4 4.6 4.7   TSH: Recent  Labs    02/02/19 1122 11/22/19 1433  TSH 1.69 1.666   A1C: Lab Results  Component Value Date   HGBA1C 5.7 (H) 07/07/2019     Assessment/Plan 1. Dark stools -pt with dark stools recently, they have improved in color however she is on eliquis. Will follow up CBC to make sure hgb is stable and get iFOB - CBC with Differential/Platelet - Fecal occult blood, imunochemical([facility]); Future  2. Chronic idiopathic constipation -due to recent dietary choices, increase hydration and encouraged prune juice which has been successful in the past.   3. New onset atrial fibrillation ([place]) Continues on  eliquis 5 mg BID, will dark stools will follow up hgb and ifob. Does appear to have any other symptoms of bleeding with palpitations, dizziness, lightheadedness, chest pains or shortness of breath.  Next appt: 05/08/2020 Carlos American. Harle Battiest  [facility] [phone]     Virtual Visit via Telephone Note  I connected with@ on 01/11/20 at  9:45 AM EDT by telephone and verified that I am speaking with the correct person using two identifiers.  Location: Patient: home Provider: [facility]   I discussed the limitations, risks, security and privacy concerns of performing an evaluation and management service by telephone and the availability of in person appointments. I also discussed with the patient that there may be a patient responsible charge related to this service. The patient expressed understanding and agreed to proceed.   I discussed the assessment and treatment plan with the patient. The patient was provided an opportunity to ask questions and all were answered. The patient agreed with the plan and demonstrated an understanding of the instructions.   The patient was advised to call back or seek an in-person evaluation if the symptoms worsen or if the condition fails to improve as anticipated.  I provided 12 minutes of non-face-to-face  time during this encounter.  Carlos American. Harle Battiest Avs printed and mailed

## 2020-01-14 ENCOUNTER — Other Ambulatory Visit: Payer: Self-pay | Admitting: Nurse Practitioner

## 2020-01-14 DIAGNOSIS — I10 Essential (primary) hypertension: Secondary | ICD-10-CM

## 2020-01-18 ENCOUNTER — Other Ambulatory Visit: Payer: Self-pay | Admitting: Nurse Practitioner

## 2020-01-18 DIAGNOSIS — I10 Essential (primary) hypertension: Secondary | ICD-10-CM

## 2020-01-21 LAB — FECAL GLOBIN BY IMMUNOCHEMISTRY
FECAL GLOBIN RESULT:: NOT DETECTED
MICRO NUMBER:: 10929352
SPECIMEN QUALITY:: ADEQUATE

## 2020-01-26 ENCOUNTER — Other Ambulatory Visit: Payer: Self-pay

## 2020-01-26 ENCOUNTER — Ambulatory Visit (HOSPITAL_COMMUNITY)
Admission: RE | Admit: 2020-01-26 | Discharge: 2020-01-26 | Disposition: A | Discharge status: Home / Self Care | Payer: PPO | Source: Ambulatory Visit | Attending: Gastroenterology | Admitting: Gastroenterology

## 2020-01-26 DIAGNOSIS — K7460 Unspecified cirrhosis of liver: Secondary | ICD-10-CM | POA: Diagnosis not present

## 2020-01-26 DIAGNOSIS — R188 Other ascites: Secondary | ICD-10-CM | POA: Diagnosis not present

## 2020-01-28 ENCOUNTER — Ambulatory Visit: Payer: PPO | Admitting: Cardiovascular Disease

## 2020-01-28 ENCOUNTER — Other Ambulatory Visit: Payer: Self-pay

## 2020-01-28 ENCOUNTER — Telehealth: Payer: Self-pay | Admitting: Gastroenterology

## 2020-01-28 ENCOUNTER — Encounter: Payer: Self-pay | Admitting: Cardiovascular Disease

## 2020-01-28 VITALS — BP 118/60 | HR 67 | Ht 61.0 in | Wt 141.4 lb

## 2020-01-28 DIAGNOSIS — I480 Paroxysmal atrial fibrillation: Secondary | ICD-10-CM

## 2020-01-28 NOTE — Telephone Encounter (Signed)
Lm on vm for patient to return call 

## 2020-01-28 NOTE — Telephone Encounter (Signed)
Spoke with patient regarding [place] results, see result note for more information.

## 2020-01-28 NOTE — Patient Instructions (Signed)
Medication Instructions:  No changes *If you need a refill on your cardiac medications before your next appointment, please call your pharmacy*   Lab Work: none If you have labs (blood work) drawn today and your tests are completely normal, you will receive your results only by: Marland Kitchen MyChart Message (if you have MyChart) OR . A paper copy in the mail If you have any lab test that is abnormal or we need to change your treatment, we will call you to review the results.   Testing/Procedures: none   Follow-Up: At [facility], you and your health needs are our priority.  As part of our continuing mission to provide you with exceptional heart care, we have created designated Provider Care Teams.  These Care Teams include your primary Cardiologist (physician) and Advanced Practice Providers (APPs -  Physician Assistants and Nurse Practitioners) who all work together to provide you with the care you need, when you need it.  We recommend signing up for the patient portal called "MyChart".  Sign up information is provided on this After Visit Summary.  MyChart is used to connect with patients for Virtual Visits (Telemedicine).  Patients are able to view lab/test results, encounter notes, upcoming appointments, etc.  Non-urgent messages can be sent to your provider as well.   To learn more about what you can do with MyChart, go to [email or web address].    Your next appointment:   6 month(s)  The format for your next appointment:   In Person  Provider:   You may see  or one of the following Advanced Practice Providers on your designated Care Team:    Melina Copa, PA-C  Ermalinda Barrios, PA-C    Other Instructions

## 2020-01-28 NOTE — Progress Notes (Signed)
Chief Complaint  Patient presents with   New Patient (Initial Visit)    atrial fibrillation   History of Present Illness: 79 yo female with history of atrial fibrillation, anxiety, asthma, fibromyalgia, GERD, HTN, HLD, IBS and cirrhosis secondary to NASH with esophageal varices here today as a new patient, referred by Sherrie Mustache NP, for further management of atrial fibrillation. She was admitted to [facility] in July 2021 with fever/UTI and found to have atrial fibrillation. She was also treated for C diff. She was started on Eliquis and her beta blocker was continued. Echo 11/23/19 with LVEF=60-65%. No significant valve disease.   She tells me today that she has been feeling well. The patient denies any chest pain, dyspnea, palpitations, lower extremity edema, orthopnea, PND, dizziness, near syncope or syncope.   Primary Care Physician: Lauree Chandler, NP   Past Medical History:  Diagnosis Date   ALLERGIC RHINITIS    Anxiety    Asthma    Blood type O+    Cervical strain, acute    Cirrhosis ([place])    Colon polyp 2010   adenoma   Diverticulosis of colon    Dizziness    Esophageal varices (H[facility])    Fibromyalgia    GERD (gastroesophageal reflux disease)    History of nephrolithiasis    Hypercholesterolemia    borderline   Hypertension    IBS (irritable bowel syndrome)    Osteoarthritis    Personal history of allergy to unspecified medicinal agent    Postconcussion syndrome    Vitamin D deficiency     Past Surgical History:  Procedure Laterality Date   CHOLECYSTECTOMY, LAPAROSCOPIC  2004   for gallstone pancreatitis   KNEE SURGERY Right    KNEE SURGERY Left    THYROID SURGERY     cyst removed    Current Outpatient Medications  Medication Sig Dispense Refill   acetaminophen (TYLENOL) 500 MG tablet Take 1,000 mg by mouth 2 (two) times a day.     ADVAIR HFA 115-21 MCG/ACT inhaler TAKE 2 PUFFS BY MOUTH TWICE A DAY 12 Inhaler 5   amLODipine  (NORVASC) 5 MG tablet TAKE 1 TABLET BY MOUTH EVERY DAY 30 tablet 0   ELIQUIS 5 MG TABS tablet TAKE 1 TABLET BY MOUTH TWICE A DAY 60 tablet 0   esomeprazole (NEXIUM) 20 MG capsule Take 1 capsule (20 mg total) by mouth daily. 90 capsule 3   losartan (COZAAR) 25 MG tablet TAKE 1 TABLET BY MOUTH EVERY DAY 90 tablet 1   meclizine (ANTIVERT) 25 MG tablet TAKE 1 TABLET BY MOUTH 3 TIMES A DAY AS NEEDED FOR DIZZINESS OR NAUSEA 90 tablet 0   Omega-3 Fatty Acids (FISH OIL MAXIMUM STRENGTH) 1200 MG CAPS Take 1,200 mg by mouth 2 (two) times daily.      PROAIR HFA 108 (90 Base) MCG/ACT inhaler INHALE 2 PUFFS EVERY 4 HOURS AS NEEDED INTO THE LUNGS FOR WHEEZING OR SHORTNESS OF BREATH 25.5 Inhaler 1   propranolol (INDERAL) 10 MG tablet Take 1 tablet (10 mg total) by mouth 2 (two) times daily. 180 tablet 1   Propylene Glycol (SYSTANE BALANCE) 0.6 % SOLN Place 1 drop into both ears 2 (two) times daily. 15 mL 0   No current facility-administered medications for this visit.    Allergies  Allergen Reactions   Ace Inhibitors     cough   Amoxicillin Itching    REACTION: unknown? pain in right kidney   Benicar Hct [Olmesartan Medoxomil-Hctz]  Extreme weakness   Budesonide-Formoterol Fumarate     Causes tremors and numbness   Chlorzoxazone [Chlorzoxazone]    Citalopram Hydrobromide     REACTION: hives   Citalopram Hydrobromide    Clonidine Derivatives    Cymbalta [Duloxetine Hcl]    Droperidol     REACTION: hives   Flexeril [Cyclobenzaprine Hcl]    Fluconazole Nausea And Vomiting and Other (See Comments)    fatigue   Gabapentin Itching   Ketorolac Tromethamine     REACTION: hives   Ketorolac Tromethamine    Metoclopramide Hcl    Mometasone Furo-Formoterol Fum     Causes sore throat, blurred vision and unable to sleep--started on med 09-17-10   Morphine     REACTION: hives and itching   Olmesartan Medoxomil     REACTION: fatique   Breo Ellipta [Fluticasone  Furoate-Vilanterol] Itching    Pt reports itching with Memory Dance is related to being lactose intolerant.     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[employer], [employer]  Tobacco Use   Smoking status: Former Smoker    Packs/day: 2.00    Years: 8.00    Pack years: 16.00    Types: Cigarettes    Quit date: 05/13/1962    Years since quitting: 57.7   Smokeless tobacco: Never Used  Vaping Use   Vaping Use: Never used  Substance and Sexual Activity   Alcohol use: No   Drug use: No   Sexual activity: Not Currently    Birth control/protection: Post-menopausal  Other Topics Concern   Not on file  Social History Narrative   exercises 3x a week   drinks 1 cup caffeine every other day   Social Determinants of Health   Financial Resource Strain:    Difficulty of Paying Living Expenses: Not on file  Food Insecurity:    Worried About Charity fundraiser in the Last Year: Not on file   [employer] of Food in the Last Year: Not on file  Transportation Needs:    Lack of Transportation (Medical): Not on file   Lack of Transportation (Non-Medical): Not on file  Physical Activity:    Days of Exercise per Week: Not on file   Minutes of Exercise per Session: Not on file  Stress:    Feeling of Stress : Not on file  Social Connections:    Frequency of Communication with Friends and Family: Not on file   Frequency of Social Gatherings with Friends and Family: Not on file   Attends Religious Services: Not on file   Active Member of Clubs or Organizations: Not on file   Attends Archivist Meetings: Not on file   Marital Status: Not on file  Intimate Partner Violence:    Fear of Current or Ex-Partner: Not on  file   Emotionally Abused: Not on file   Physically Abused: Not on file   Sexually Abused: Not on file    Family History  Problem Relation Age of Onset   Breast cancer Mother    Alzheimer's disease Mother    COPD Brother    Alcohol abuse Brother    Heart disease Sister    Breast cancer  Sister 64   Alcohol abuse Sister    Ovarian cancer Paternal Grandmother    Arthritis Other        Cousins    COPD Cousin        Maternal side    Heart attack Maternal Uncle    Colon cancer Neg Hx    Esophageal cancer Neg Hx     Review of Systems:  As stated in the HPI and otherwise negative.   BP 118/60    Pulse 67    Ht 5' 1"  (1.549 m)    Wt 141 lb 6.6 oz (64.1 kg)    SpO2 97%    BMI 26.72 kg/m   Physical Examination: General: Well developed, well nourished, NAD  HEENT: OP clear, mucus membranes moist  SKIN: warm, dry. No rashes. Neuro: No focal deficits  Musculoskeletal: Muscle strength 5/5 all ext  Psychiatric: Mood and affect normal  Neck: No JVD, no carotid bruits, no thyromegaly, no lymphadenopathy.  Lungs:Clear bilaterally, no wheezes, rhonci, crackles Cardiovascular: Regular rate and rhythm. No murmurs, gallops or rubs. Abdomen:Soft. Bowel sounds present. Non-tender.  Extremities: No lower extremity edema. Pulses are 2 + in the bilateral DP/PT.  EKG:  EKG is ordered today. The ekg ordered today demonstrates Sinus  Echo 11/23/19: 1. Left ventricular ejection fraction, by estimation, is 60 to 65%. The  left ventricle has normal function. The left ventricle has no regional  wall motion abnormalities. Left ventricular diastolic parameters are  indeterminate.  2. Right ventricular systolic function is normal. The right ventricular  size is normal. There is mildly elevated pulmonary artery systolic  pressure.  3. Left atrial size was mild to moderately dilated.  4. The mitral valve is normal in structure. Trivial mitral valve  regurgitation. No evidence of  mitral stenosis.  5. The aortic valve is tricuspid. Aortic valve regurgitation is not  visualized. No aortic stenosis is present.  6. The inferior vena cava is normal in size with <50% respiratory  variability, suggesting right atrial pressure of 8 mmHg.   Recent Labs: 11/22/2019: TSH 1.666 11/27/2019: Magnesium 1.7 12/08/2019: ALT 17; BUN 13; Creat 0.94; Potassium 4.6; Sodium 139 01/11/2020: Hemoglobin 13.6; Platelets 385   Lipid Panel    Component Value Date/Time   CHOL 230 (H) 10/22/2019 0904   CHOL 217 (H) 11/10/2015 0903   TRIG 130 10/22/2019 0904   HDL 49 (L) 10/22/2019 0904   HDL 44 11/10/2015 0903   CHOLHDL 4.7 10/22/2019 0904   VLDL 27 12/31/2016 0851   LDLCALC 156 (H) 10/22/2019 0904   LDLDIRECT 102.5 07/02/2012 1100     Wt Readings from Last 3 Encounters:  01/28/20 141 lb 6.6 oz (64.1 kg)  12/31/19 139 lb 12.8 oz (63.4 kg)  12/08/19 146 lb (66.2 kg)      Assessment and Plan:   1. Atrial fibrillation, paroxysmal: Sinus today. CHADS VASC score of 4. Continue beta blocker and Eliquis.   Current medicines are reviewed at length with the patient today.  The patient does not have concerns regarding medicines.  The following changes have been made:  no change  Labs/ tests ordered today include:   Orders Placed This Encounter  Procedures   EKG 12-Lead   Disposition:   FU with me in 6 months.   Signed, Lauree Chandler, MD 01/28/2020 9:44 AM    [facility] [place], [place], [place]  [address] Phone: [phone]; Fax: [phone]

## 2020-02-11 ENCOUNTER — Other Ambulatory Visit: Payer: Self-pay | Admitting: Nurse Practitioner

## 2020-02-11 DIAGNOSIS — I10 Essential (primary) hypertension: Secondary | ICD-10-CM

## 2020-02-23 ENCOUNTER — Other Ambulatory Visit: Payer: Self-pay | Admitting: Nurse Practitioner

## 2020-02-24 NOTE — Telephone Encounter (Signed)
High risk or very high risk warning populated when attempting to refill medication. RX request sent to PCP for review and approval if warranted.   

## 2020-02-25 ENCOUNTER — Telehealth: Payer: Self-pay

## 2020-02-25 NOTE — Telephone Encounter (Signed)
Called patient with recommendation.

## 2020-02-25 NOTE — Telephone Encounter (Signed)
Yes this is okay,  would recommend starting melatonin 1 mg by mouth at bedtime.

## 2020-02-25 NOTE — Telephone Encounter (Signed)
Patient states she is having difficulty sleeping. She wants to know if it is ok to try melatonin.

## 2020-03-18 ENCOUNTER — Other Ambulatory Visit: Payer: Self-pay | Admitting: Nurse Practitioner

## 2020-03-18 DIAGNOSIS — I10 Essential (primary) hypertension: Secondary | ICD-10-CM

## 2020-03-25 ENCOUNTER — Other Ambulatory Visit: Payer: Self-pay | Admitting: Nurse Practitioner

## 2020-03-25 DIAGNOSIS — I10 Essential (primary) hypertension: Secondary | ICD-10-CM

## 2020-03-25 DIAGNOSIS — R809 Proteinuria, unspecified: Secondary | ICD-10-CM

## 2020-03-25 DIAGNOSIS — R7303 Prediabetes: Secondary | ICD-10-CM

## 2020-03-27 NOTE — Telephone Encounter (Signed)
Patient is requesting refill on medication "Eliquis 22m" and "Losartan 262m". Last refill for Eliquis was 01/19/2020 and "Losartan 2559mwas 09/17/2019. Patient is due for refill on both medications. Patient has allergy warnings on medication. Medications pend and sent to [facility] ChandlerP . Please Advise.

## 2020-03-31 ENCOUNTER — Ambulatory Visit: Payer: PPO | Admitting: Podiatry

## 2020-03-31 ENCOUNTER — Encounter: Payer: Self-pay | Admitting: Podiatry

## 2020-03-31 ENCOUNTER — Other Ambulatory Visit: Payer: Self-pay

## 2020-03-31 DIAGNOSIS — M79675 Pain in left toe(s): Secondary | ICD-10-CM

## 2020-03-31 DIAGNOSIS — L84 Corns and callosities: Secondary | ICD-10-CM | POA: Diagnosis not present

## 2020-03-31 DIAGNOSIS — B070 Plantar wart: Secondary | ICD-10-CM

## 2020-03-31 DIAGNOSIS — B351 Tinea unguium: Secondary | ICD-10-CM

## 2020-03-31 DIAGNOSIS — M792 Neuralgia and neuritis, unspecified: Secondary | ICD-10-CM

## 2020-03-31 NOTE — Patient Instructions (Signed)
Apply Compound W to left foot wart once daily on Monday, Wednesday and Friday.  WARTS (Verrucae)  Warts are caused by a virus that has invaded the skin.  They are more common in young adults and children and a small percentage will resolve on their own.  There are many types of warts including mosaic warts (large flat), vulgaris (domed warts-have pearl like appearance), and plantar warts (flat or cauliflower like appearance).  Warts are highly contagious and may be picked up from any surface.  Warts thrive in a warm moist environment and are common near pools, showers, and locker room floors.  Any microscopic cut in the skin is where the virus enters and becomes a wart.  Warts are very difficult to treat and get rid of.  Patience is necessary in the treatment of this virus.  It may take months to cure and different methods may have to be used to get rid of your wart.  Standard Initial Treatment is: 1. Periodic debridement of the wart and application of Canthacur to each lesion (a blistering agent that will slough off the warty skin) 2. Dispensing of topical treatments/prescriptions to apply to the wart at home  Other options include: 1. Excision of the lesion-numbing the skin around the wart and cutting it out-requires daily soaks post-operatively and takes about 2-3 weeks to fully heal 2. Excision with CO2 Laser-Performed at the surgical center your foot is numbed up and the lesions are all cut out and then lasered with a high power laser.  Very good for multiple warts that are resistant. 3. Cimetidine (Tagamet)-Oral agent used in high does--has shown better results in children  How do I apply the standard topical treatments?  1. Salicylic Acid (Compound W wart remover liquid or gel-available at drug or grocery stores)-Apply a dime size thickness over the wart and cover with duct tape-apply at night so the medication does not spread out to the good skin.  The skin will turn white and slowly  blister off.  Use a pumice stone daily to remove the white skin as best you can.  If the skin gets too raw and painful, discontinue for a few days then resume. 2. Aldara (Imiquimod)-this is an immune response modifier.  They come in little packets so try to get at least 2 days out of each packet if you can.  Apply a small amount to the lesion and cover with duct tape.  Do not rub it in-let it absorb on its own.  Good to apply each morning.  Other Helpful Hints:  Wash shoes that can be washed in the washing machine 2-3 x per month with some bleach  Use Lysol in shoes that cannot be washed and wipe out with a cloth 1 x per week-allow to dry for 8 hours before wearing again  Use a bleach solution (1 part bleach to 3 parts water) in your tub or shower to reduce the spread of the virus to yourself and others  Use aqua socks or clean sandals when at the pool or locker room to reduce the chance of picking up the virus or spreading it to others

## 2020-04-02 NOTE — Progress Notes (Signed)
Subjective: SKILER TYE is a 79 y.o. female patient seen today callus(es) plantar aspect of both feet and painful mycotic toenails b/l that are difficult to trim. Pain interferes with ambulation. Aggravating factors include wearing enclosed shoe gear. Pain is relieved with periodic professional debridement.  She also has h/o neuropathic pain. Is using topical compounded neuropathy cream from [place]. It does help with her symptoms.   Patient Active Problem List   Diagnosis Date Noted  . New onset atrial fibrillation ([place]) 11/22/2019  . C. difficile colitis 11/20/2019  . Other cirrhosis of liver ([place]) 11/20/2019  . Fever 11/19/2019  . Acute lower UTI 11/19/2019  . Diarrhea 11/19/2019  . Generalized weakness   . Esophageal varices without bleeding ([place]) 07/07/2019  . Bilateral hip pain 04/29/2019  . Pancreatitis 07/05/2017  . Chronic pansinusitis 12/02/2016  . Asthma, allergic, mild intermittent, uncomplicated 37/29/0211  . Environmental and seasonal allergies 12/02/2016  . Obesity (BMI 30.0-34.9) 12/02/2016  . Allergic rhinitis 10/15/2016  . Presbyopia of both eyes 06/27/2016  . Pseudophakia 06/27/2016  . Urine frequency 06/26/2016  . Obese 06/26/2016  . Hyperglycemia 11/13/2015  . Numbness and tingling of both legs below knees 05/24/2014  . Dupuytren's contracture of both hands 01/18/2014  . Dyslipidemia 10/08/2011  . Asthmatic bronchitis 04/25/2011  . Acute sinusitis, unspecified 12/20/2010  . Back pain 07/12/2010  . NEPHROLITHIASIS 04/26/2009  . Vitamin D deficiency 10/30/2008  . COLONIC POLYPS 10/22/2007  . Diverticulosis of colon 10/22/2007  . Seasonal and perennial allergic rhinitis 06/13/2007  . GERD 06/13/2007  . IBS 06/13/2007  . FIBROMYALGIA 06/13/2007  . PERSONAL HISTORY ALLERGY UNSPEC MEDICINAL AGENT 06/13/2007  . Anxiety state 05/04/2007  . Essential hypertension 05/04/2007  . Osteoarthritis 05/04/2007  . DIZZINESS 04/28/2007    Current  Outpatient Medications on File Prior to Visit  Medication Sig Dispense Refill  . ELIQUIS 5 MG TABS tablet TAKE 1 TABLET BY MOUTH TWICE A DAY 60 tablet 0  . losartan (COZAAR) 25 MG tablet TAKE 1 TABLET BY MOUTH EVERY DAY 90 tablet 1  . acetaminophen (TYLENOL) 500 MG tablet Take 1,000 mg by mouth 2 (two) times a day.    Marland Kitchen ADVAIR HFA 115-21 MCG/ACT inhaler TAKE 2 PUFFS BY MOUTH TWICE A DAY 12 each 5  . amLODipine (NORVASC) 5 MG tablet TAKE 1 TABLET BY MOUTH EVERY DAY 90 tablet 1  . esomeprazole (NEXIUM) 20 MG capsule Take 1 capsule (20 mg total) by mouth daily. 90 capsule 3  . meclizine (ANTIVERT) 25 MG tablet TAKE 1 TABLET BY MOUTH 3 TIMES A DAY AS NEEDED FOR DIZZINESS OR NAUSEA 90 tablet 0  . Omega-3 Fatty Acids (FISH OIL MAXIMUM STRENGTH) 1200 MG CAPS Take 1,200 mg by mouth 2 (two) times daily.     Marland Kitchen PROAIR HFA 108 (90 Base) MCG/ACT inhaler INHALE 2 PUFFS EVERY 4 HOURS AS NEEDED INTO THE LUNGS FOR WHEEZING OR SHORTNESS OF BREATH 25.5 Inhaler 1  . propranolol (INDERAL) 10 MG tablet Take 1 tablet (10 mg total) by mouth 2 (two) times daily. 180 tablet 1  . Propylene Glycol (SYSTANE BALANCE) 0.6 % SOLN Place 1 drop into both ears 2 (two) times daily. 15 mL 0   No current facility-administered medications on file prior to visit.    Allergies  Allergen Reactions  . Ace Inhibitors     cough  . Amoxicillin Itching    REACTION: unknown? pain in right kidney  . Benicar Hct [Olmesartan Medoxomil-Hctz]     Extreme weakness  .  Budesonide-Formoterol Fumarate     Causes tremors and numbness  . Chlorzoxazone [Chlorzoxazone]   . Citalopram Hydrobromide     REACTION: hives  . Citalopram Hydrobromide   . Clonidine Derivatives   . Cymbalta [Duloxetine Hcl]   . Droperidol     REACTION: hives  . Flexeril [Cyclobenzaprine Hcl]   . Fluconazole Nausea And Vomiting and Other (See Comments)    fatigue  . Gabapentin Itching  . Ketorolac Tromethamine     REACTION: hives  . Ketorolac Tromethamine   .  Metoclopramide Hcl   . Mometasone Furo-Formoterol Fum     Causes sore throat, blurred vision and unable to sleep--started on med 09-17-10  . Morphine     REACTION: hives and itching  . Olmesartan Medoxomil     REACTION: fatique  . Breo Ellipta [Fluticasone Furoate-Vilanterol] Itching    Pt reports itching with Memory Dance is related to being lactose intolerant.     Objective: Physical Exam  General: DAYNE DEKAY is a pleasant 79 y.o.  Caucasian female, in NAD. AAO x 3.   Vascular:  Capillary fill time to digits <3 seconds b/l. Palpable DP pulses b/l. Palpable PT pulses b/l. Pedal hair absent b/l Skin temperature gradient within normal limits b/l. No edema noted b/l.  Dermatological:  Pedal skin with normal turgor, texture and tone bilaterally. No open wounds bilaterally. No interdigital macerations bilaterally. Toenails 1-5 b/l elongated, discolored, dystrophic, thickened, crumbly with subungual debris and tenderness to dorsal palpation. Hyperkeratotic lesion(s) submet head 2 right foot.  No erythema, no edema, no drainage, no fluctuance. Annular hyperkeratotic lesion noted with visible subcutaneous pinpoint capillaries noted submet head 3 left foot.   Musculoskeletal:  Normal muscle strength 5/5 to all lower extremity muscle groups bilaterally. No gross bony deformities bilaterally. No pain crepitus or joint limitation noted with ROM b/l.  Neurological:  Pt has subjective symptoms of neuropathy. Protective sensation intact 5/5 intact bilaterally with 10g monofilament b/l. Vibratory sensation intact b/l. Proprioception intact bilaterally.  Assessment and Plan:  1. Pain due to onychomycosis of toenails of both feet   2. Callus   3. Verruca plantaris   4. Neuropathic pain    -Examined patient. -No new findings. No new orders. -Toenails 1-5 b/l debrided in length and girth with sterile nail nipper and dremel without iatrogenic laceration. -Callus(es) submet head 2 right foot pared  utilizing sterile scalpel blade without complication or incident. Total number debrided =1. -Discussed plantar wart and need for treatment. Lesion pared. Light bleeding of capillaries addressed with Lumicain Hemostatic Solution. She is to apply Compound W to lesion once daily on MWF. -Continue compounded topical neuropathy cream from [place]. -Patient to continue soft, supportive shoe gear daily. -Patient to report any pedal injuries to medical professional immediately. -Patient/POA to call should there be question/concern in the interim.  Return in about 1 month (around 04/30/2020).  Marzetta Board, DPM

## 2020-04-11 ENCOUNTER — Other Ambulatory Visit: Payer: Self-pay | Admitting: Nurse Practitioner

## 2020-04-11 DIAGNOSIS — R059 Cough, unspecified: Secondary | ICD-10-CM

## 2020-04-11 DIAGNOSIS — R5383 Other fatigue: Secondary | ICD-10-CM | POA: Diagnosis not present

## 2020-04-11 DIAGNOSIS — R519 Headache, unspecified: Secondary | ICD-10-CM | POA: Diagnosis not present

## 2020-04-11 DIAGNOSIS — Z20822 Contact with and (suspected) exposure to covid-19: Secondary | ICD-10-CM | POA: Diagnosis not present

## 2020-04-11 DIAGNOSIS — R0981 Nasal congestion: Secondary | ICD-10-CM | POA: Diagnosis not present

## 2020-04-11 DIAGNOSIS — J4520 Mild intermittent asthma, uncomplicated: Secondary | ICD-10-CM

## 2020-04-11 MED ORDER — ALBUTEROL SULFATE HFA 108 (90 BASE) MCG/ACT IN AERS: INHALATION_SPRAY | RESPIRATORY_TRACT | 5 refills | Status: DC

## 2020-04-11 NOTE — Telephone Encounter (Signed)
High risk or very high risk warning populated when attempting to refill medication. RX request sent to PCP for review and approval if warranted.    Patient had also mentioned that she has a headache, extreme fatigue, and overall not feeling well since gathering with her family for Thanksgiving (many unvaccinated). Patient questioned when should she get tested for covid. I had a side conversation with Carla Chandler, NP and she recommended patient get tested now. Patient plans to get tested at [facility] today for it is next door to her residence.

## 2020-04-14 ENCOUNTER — Telehealth: Payer: Self-pay | Admitting: *Deleted

## 2020-04-14 NOTE — Telephone Encounter (Signed)
Patient dropped off [employer] Patient Assistance Form to be filled out for Advair and Eliquis. [employer] Patient Assistance Program [phone] Fax: [place] [phone]. [email or web address]  Filled out and placed in [place] to review and sign.   To call patient once ready for pick up.

## 2020-04-17 NOTE — Telephone Encounter (Signed)
Patient notified and agreed and stated that she would like to pick up and mail.  Requested the forms to given to Renaye Rakers, Friend.  Copy sent for scanning.

## 2020-04-19 ENCOUNTER — Other Ambulatory Visit: Payer: Self-pay | Admitting: Nurse Practitioner

## 2020-05-08 ENCOUNTER — Ambulatory Visit (INDEPENDENT_AMBULATORY_CARE_PROVIDER_SITE_OTHER): Payer: PPO | Admitting: Nurse Practitioner

## 2020-05-08 ENCOUNTER — Other Ambulatory Visit: Payer: Self-pay

## 2020-05-08 ENCOUNTER — Encounter: Payer: Self-pay | Admitting: Nurse Practitioner

## 2020-05-08 VITALS — BP 136/78 | HR 71 | Temp 97.1°F | Ht 61.0 in | Wt 148.0 lb

## 2020-05-08 DIAGNOSIS — J4520 Mild intermittent asthma, uncomplicated: Secondary | ICD-10-CM | POA: Diagnosis not present

## 2020-05-08 DIAGNOSIS — M8580 Other specified disorders of bone density and structure, unspecified site: Secondary | ICD-10-CM | POA: Diagnosis not present

## 2020-05-08 DIAGNOSIS — K7460 Unspecified cirrhosis of liver: Secondary | ICD-10-CM | POA: Diagnosis not present

## 2020-05-08 DIAGNOSIS — R739 Hyperglycemia, unspecified: Secondary | ICD-10-CM

## 2020-05-08 DIAGNOSIS — K219 Gastro-esophageal reflux disease without esophagitis: Secondary | ICD-10-CM

## 2020-05-08 DIAGNOSIS — E782 Mixed hyperlipidemia: Secondary | ICD-10-CM

## 2020-05-08 DIAGNOSIS — K5904 Chronic idiopathic constipation: Secondary | ICD-10-CM

## 2020-05-08 DIAGNOSIS — I480 Paroxysmal atrial fibrillation: Secondary | ICD-10-CM | POA: Diagnosis not present

## 2020-05-08 DIAGNOSIS — I10 Essential (primary) hypertension: Secondary | ICD-10-CM | POA: Diagnosis not present

## 2020-05-08 DIAGNOSIS — I8500 Esophageal varices without bleeding: Secondary | ICD-10-CM | POA: Diagnosis not present

## 2020-05-08 NOTE — Progress Notes (Signed)
Careteam: Patient Care Team: Lauree Chandler, NP as PCP - General (Nurse Practitioner)  PLACE OF SERVICE:  [facility] Directive information Does Patient Have a Medical Advance Directive?: Yes, Type of Advance Directive: Out of facility DNR (pink MOST or yellow form), Pre-existing out of facility DNR order (yellow form or pink MOST form): Yellow form placed in chart (order not valid for inpatient use);Pink MOST form placed in chart (order not valid for inpatient use), Does patient want to make changes to medical advance directive?: No - Patient declined  Allergies  Allergen Reactions  . Ace Inhibitors     cough  . Amoxicillin Itching    REACTION: unknown? pain in right kidney  . Benicar Hct [Olmesartan Medoxomil-Hctz]     Extreme weakness  . Budesonide-Formoterol Fumarate     Causes tremors and numbness  . Chlorzoxazone [Chlorzoxazone]   . Citalopram Hydrobromide     REACTION: hives  . Citalopram Hydrobromide   . Clonidine Derivatives   . Cymbalta [Duloxetine Hcl]   . Droperidol     REACTION: hives  . Flexeril [Cyclobenzaprine Hcl]   . Fluconazole Nausea And Vomiting and Other (See Comments)    fatigue  . Gabapentin Itching  . Ketorolac Tromethamine     REACTION: hives  . Ketorolac Tromethamine   . Metoclopramide Hcl   . Mometasone Furo-Formoterol Fum     Causes sore throat, blurred vision and unable to sleep--started on med 09-17-10  . Morphine     REACTION: hives and itching  . Olmesartan Medoxomil     REACTION: fatique  . Breo Ellipta [Fluticasone Furoate-Vilanterol] Itching    Pt reports itching with Memory Dance is related to being lactose intolerant.     Chief Complaint  Patient presents with  . Medical Management of Chronic Issues    4 month follow-up. Examine area on back- mole.      HPI: Patient is a 79 y.o. female for routine follow up.   Reports she was exposed to [place] and then woke up sick 5 days later. Went and got tested for FLU and COVID  and it was negative  She reports she has had Covid booster, flu recently.   Denies shortness of breath.   Reports she is exercise and stretch.  Feeling well.   Paroxysmal a fib- continues on eliquis- felt like a fib was due to to stress of c diff. Continues on beta blocker.   Has gained weight due to dinners and snacks over the holidays. Plans on getting back on it.   GERD- controlled on nexium  Asthma/allergies- stable on advair     Review of Systems:  Review of Systems  Constitutional: Negative for chills, fever and weight loss.  HENT: Negative for tinnitus.   Respiratory: Negative for cough, sputum production and shortness of breath.   Cardiovascular: Negative for chest pain, palpitations and leg swelling.  Gastrointestinal: Negative for abdominal pain, constipation, diarrhea and heartburn.  Genitourinary: Negative for dysuria, frequency and urgency.  Musculoskeletal: Positive for myalgias ("stiffness"). Negative for back pain, falls and joint pain.  Skin: Negative.   Neurological: Negative for dizziness and headaches.  Psychiatric/Behavioral: Negative for depression and memory loss. The patient does not have insomnia.     Past Medical History:  Diagnosis Date  . ALLERGIC RHINITIS   . Anxiety   . Asthma   . Blood type O+   . Cervical strain, acute   . Cirrhosis ([place])   . Colon polyp 2010  adenoma  . Diverticulosis of colon   . Dizziness   . Esophageal varices ([place])   . Fibromyalgia   . GERD (gastroesophageal reflux disease)   . History of nephrolithiasis   . Hypercholesterolemia    borderline  . Hypertension   . IBS (irritable bowel syndrome)   . Osteoarthritis   . Personal history of allergy to unspecified medicinal agent   . Postconcussion syndrome   . Vitamin D deficiency    Past Surgical History:  Procedure Laterality Date  . CHOLECYSTECTOMY, LAPAROSCOPIC  2004   for gallstone pancreatitis  . KNEE SURGERY Right   . KNEE SURGERY Left   . THYROID  SURGERY     cyst removed   Social History:   reports that she quit smoking about 58 years ago. Her smoking use included cigarettes. She has a 16.00 pack-year smoking history. She has never used smokeless tobacco. She reports that she does not drink alcohol and does not use drugs.  Family History  Problem Relation Age of Onset  . Breast cancer Mother   . Alzheimer's disease Mother   . COPD Brother   . Alcohol abuse Brother   . Heart disease Sister   . Breast cancer Sister 73  . Alcohol abuse Sister   . Ovarian cancer Paternal Grandmother   . Arthritis Other        Cousins   . COPD Cousin        Maternal side   . Heart attack Maternal Uncle   . Colon cancer Neg Hx   . Esophageal cancer Neg Hx     Medications: Patient's Medications  New Prescriptions   No medications on file  Previous Medications   ACETAMINOPHEN (TYLENOL) 500 MG TABLET    Take 1,000 mg by mouth 2 (two) times a day.   ADVAIR HFA 115-21 MCG/ACT INHALER    TAKE 2 PUFFS BY MOUTH TWICE A DAY   ALBUTEROL (PROAIR HFA) 108 (90 BASE) MCG/ACT INHALER    INHALE 2 PUFFS EVERY 4 HOURS AS NEEDED INTO THE LUNGS FOR WHEEZING OR SHORTNESS OF BREATH J45.20   AMLODIPINE (NORVASC) 5 MG TABLET    TAKE 1 TABLET BY MOUTH EVERY DAY   ELIQUIS 5 MG TABS TABLET    TAKE 1 TABLET BY MOUTH TWICE A DAY   ESOMEPRAZOLE (NEXIUM) 20 MG CAPSULE    Take 1 capsule (20 mg total) by mouth daily.   LOSARTAN (COZAAR) 25 MG TABLET    TAKE 1 TABLET BY MOUTH EVERY DAY   MECLIZINE (ANTIVERT) 25 MG TABLET    TAKE 1 TABLET BY MOUTH 3 TIMES A DAY AS NEEDED FOR DIZZINESS OR NAUSEA   OMEGA-3 FATTY ACIDS (FISH OIL MAXIMUM STRENGTH) 1200 MG CAPS    Take 1,200 mg by mouth 2 (two) times daily.    PROPRANOLOL (INDERAL) 10 MG TABLET    Take 1 tablet (10 mg total) by mouth 2 (two) times daily.   PROPYLENE GLYCOL (SYSTANE BALANCE) 0.6 % SOLN    Place 1 drop into both ears 2 (two) times daily.  Modified Medications   No medications on file  Discontinued Medications    No medications on file    Physical Exam:  Vitals:   05/08/20 0952  BP: 136/78  Pulse: 71  Temp: (!) 97.1 F (36.2 C)  TempSrc: Temporal  SpO2: 98%  Weight: 148 lb (67.1 kg)  Height: 5' 1"  (1.549 m)   Body mass index is 27.96 kg/m. Wt Readings from Last 3 Encounters:  05/08/20 148 lb (67.1 kg)  01/28/20 141 lb 6.6 oz (64.1 kg)  12/31/19 139 lb 12.8 oz (63.4 kg)    Physical Exam Constitutional:      General: She is not in acute distress.    Appearance: She is well-developed and well-nourished. She is not diaphoretic.  HENT:     Head: Normocephalic and atraumatic.     Mouth/Throat:     Mouth: Oropharynx is clear and moist.     Pharynx: No oropharyngeal exudate.  Eyes:     Conjunctiva/sclera: Conjunctivae normal.     Pupils: Pupils are equal, round, and reactive to light.  Cardiovascular:     Rate and Rhythm: Normal rate and regular rhythm.     Heart sounds: Normal heart sounds.  Pulmonary:     Effort: Pulmonary effort is normal.     Breath sounds: Normal breath sounds.  Abdominal:     General: Bowel sounds are normal.     Palpations: Abdomen is soft.  Musculoskeletal:        General: No tenderness or edema.     Cervical back: Normal range of motion and neck supple.  Skin:    General: Skin is warm and dry.  Neurological:     Mental Status: She is alert and oriented to person, place, and time.  Psychiatric:        Mood and Affect: Mood and affect normal.     Labs reviewed: Basic Metabolic Panel: Recent Labs    11/22/19 1433 11/23/19 0649 11/25/19 0502 11/26/19 0433 11/27/19 1133 12/08/19 1154  NA  --    < > 137 141 135 139  K  --    < > 3.3* 5.1 3.5 4.6  CL  --    < > 107 110 103 103  CO2  --    < > 20* 22 22 29   GLUCOSE  --    < > 92 99 92 98  BUN  --    < > 9 8 7* 13  CREATININE  --    < > 0.62 0.62 0.51 0.94*  CALCIUM  --    < > 9.0 9.4 9.1 10.5*  MG  --    < > 1.7 1.9 1.7  --   PHOS  --    < > 4.4 4.1 3.5  --   TSH 1.666  --   --   --   --    --    < > = values in this interval not displayed.   Liver Function Tests: Recent Labs    11/23/19 0649 11/24/19 0454 11/25/19 0502 12/08/19 1154  AST 26 23 23 23   ALT 18 18 16 17   ALKPHOS 56 58 52  --   BILITOT 0.9 0.4 0.8 0.8  PROT 6.2* 6.1* 5.5* 6.8  ALBUMIN 3.3* 3.1* 2.9*  --    Recent Labs    11/19/19 1121  LIPASE 35   No results for input(s): AMMONIA in the last 8760 hours. CBC: Recent Labs    12/08/19 1154 12/21/19 1052 01/11/20 1124  WBC 10.9* 8.1 8.1  NEUTROABS 5,712 4,058 3,596  HGB 13.0 13.2 13.6  HCT 38.8 39.5 40.7  MCV 90.2 91.0 90.6  PLT 432* 324 385   Lipid Panel: Recent Labs    07/07/19 1113 10/22/19 0904  CHOL 231* 230*  HDL 50 49*  LDLCALC 151* 156*  TRIG 163* 130  CHOLHDL 4.6 4.7   TSH: Recent Labs    11/22/19 1433  TSH  1.666   A1C: Lab Results  Component Value Date   HGBA1C 5.7 (H) 07/07/2019     Assessment/Plan 1. Paroxysmal atrial fibrillation (HCC) Continues in [place] at this time. Continues on proprolol for rate control and elqiuis for anticoagution. - CBC with Differential/Platelet - COMPLETE METABOLIC PANEL WITH GFR  2. Essential hypertension Well controlled on current regimen.  3. Gastroesophageal reflux disease without esophagitis -stable on nexium  4. Osteopenia, unspecified location -recommend Vit D 2000 units daily,elevated cal so all supplements on hold.   5. Hyperglycemia -to continue diet modifications with increase in physical activity - Hemoglobin A1c  6. Mixed hyperlipidemia Continues with dietary modifications. - COMPLETE METABOLIC PANEL WITH GFR - Lipid Panel  7. Hypercalcemia -has stopped on calcium supplements at this time. - COMPLETE METABOLIC PANEL WITH GFR  8. Asthma, allergic, mild intermittent, uncomplicated Well controlled on advair.   9. Chronic idiopathic constipation Improved with dietary modifications.  10. Hepatic cirrhosis, unspecified hepatic cirrhosis type, unspecified  whether ascites present (HCC) Stable, continues on propranolol 10 mg BID.     Next appt: 3 months for weight and routine follow up. Carlos American. [place] [phone]

## 2020-05-08 NOTE — Patient Instructions (Signed)
Get plain vit D at the pharmacy Vit D 2000 units daily

## 2020-05-09 LAB — COMPLETE METABOLIC PANEL WITH GFR
AG Ratio: 2 (calc) (ref 1.0–2.5)
ALT: 12 U/L (ref 6–29)
AST: 20 U/L (ref 10–35)
Albumin: 4.3 g/dL (ref 3.6–5.1)
Alkaline phosphatase (APISO): 124 U/L (ref 37–153)
BUN: 20 mg/dL (ref 7–25)
CO2: 28 mmol/L (ref 20–32)
Calcium: 9.4 mg/dL (ref 8.6–10.4)
Chloride: 106 mmol/L (ref 98–110)
Creat: 0.64 mg/dL (ref 0.60–0.88)
GFR, Est African American: 97 mL/min/{1.73_m2} (ref >=60)
GFR, Est Non African American: 84 mL/min/{1.73_m2} (ref >=60)
Globulin: 2.2 g/dL (calc) (ref 1.9–3.7)
Glucose, Bld: 93 mg/dL (ref 65–99)
Potassium: 4.5 mmol/L (ref 3.5–5.3)
Sodium: 141 mmol/L (ref 135–146)
Total Bilirubin: 0.4 mg/dL (ref 0.2–1.2)
Total Protein: 6.5 g/dL (ref 6.1–8.1)

## 2020-05-09 LAB — LIPID PANEL
Cholesterol: 198 mg/dL (ref <200)
HDL: 59 mg/dL (ref >=50)
LDL Cholesterol (Calc): 118 mg/dL (calc) — ABNORMAL HIGH
Non-HDL Cholesterol (Calc): 139 mg/dL (calc) — ABNORMAL HIGH (ref <130)
Total CHOL/HDL Ratio: 3.4 (calc) (ref <5.0)
Triglycerides: 99 mg/dL (ref <150)

## 2020-05-09 LAB — CBC WITH DIFFERENTIAL/PLATELET
Absolute Monocytes: 561 cells/uL (ref 200–950)
Basophils Absolute: 49 cells/uL (ref 0–200)
Basophils Relative: 0.8 %
Eosinophils Absolute: 122 cells/uL (ref 15–500)
Eosinophils Relative: 2 %
HCT: 39 % (ref 35.0–45.0)
Hemoglobin: 13 g/dL (ref 11.7–15.5)
Lymphs Abs: 3489 cells/uL (ref 850–3900)
MCH: 29.1 pg (ref 27.0–33.0)
MCHC: 33.3 g/dL (ref 32.0–36.0)
MCV: 87.4 fL (ref 80.0–100.0)
MPV: 10.2 fL (ref 7.5–12.5)
Monocytes Relative: 9.2 %
Neutro Abs: 1879 cells/uL (ref 1500–7800)
Neutrophils Relative %: 30.8 %
Platelets: 273 10*3/uL (ref 140–400)
RBC: 4.46 10*6/uL (ref 3.80–5.10)
RDW: 12.5 % (ref 11.0–15.0)
Total Lymphocyte: 57.2 %
WBC: 6.1 10*3/uL (ref 3.8–10.8)

## 2020-05-09 LAB — HEMOGLOBIN A1C
Hgb A1c MFr Bld: 5.7 % of total Hgb — ABNORMAL HIGH (ref <5.7)
Mean Plasma Glucose: 117 mg/dL
eAG (mmol/L): 6.5 mmol/L

## 2020-05-16 ENCOUNTER — Other Ambulatory Visit: Payer: Self-pay | Admitting: Gastroenterology

## 2020-05-16 ENCOUNTER — Other Ambulatory Visit: Payer: Self-pay | Admitting: Nurse Practitioner

## 2020-05-16 DIAGNOSIS — K219 Gastro-esophageal reflux disease without esophagitis: Secondary | ICD-10-CM

## 2020-05-26 ENCOUNTER — Telehealth: Payer: Self-pay

## 2020-05-26 ENCOUNTER — Other Ambulatory Visit: Payer: Self-pay

## 2020-05-26 MED ORDER — APIXABAN 5 MG PO TABS: 5.0000 mg | ORAL_TABLET | Freq: Two times a day (BID) | ORAL | 3 refills | Status: DC

## 2020-05-26 NOTE — Telephone Encounter (Signed)
Patient would like you permission to et the 4th covid booster.   She will call [facility] to find out exactly she has to turn in permission and call back.

## 2020-05-26 NOTE — Telephone Encounter (Signed)
Patient requested refill on Eliquis

## 2020-06-02 ENCOUNTER — Ambulatory Visit: Payer: PPO | Admitting: Podiatry

## 2020-07-03 ENCOUNTER — Telehealth: Payer: Self-pay | Admitting: Nurse Practitioner

## 2020-07-03 NOTE — Telephone Encounter (Signed)
Patient notified and agreed.  

## 2020-07-03 NOTE — Telephone Encounter (Signed)
Would not recommend medication at this time but to increase water intake and to eat more protein and vegetables to help feel full.

## 2020-07-03 NOTE — Telephone Encounter (Signed)
Patient wants someone to call her back, in the past month in a half she's been eating a lot and gained 13lbs and wants to know if there is something to do to curve her being hungry. She wants to know if there is a way to help her with not eating so much if someone can call her back today that would be great it is no emergency she said.  CALL BACK  [phone]

## 2020-07-07 ENCOUNTER — Telehealth: Payer: Self-pay

## 2020-07-07 ENCOUNTER — Encounter: Payer: Self-pay | Admitting: Orthopedic Surgery

## 2020-07-07 ENCOUNTER — Other Ambulatory Visit: Payer: Self-pay

## 2020-07-07 ENCOUNTER — Ambulatory Visit (INDEPENDENT_AMBULATORY_CARE_PROVIDER_SITE_OTHER): Payer: PPO | Admitting: Orthopedic Surgery

## 2020-07-07 VITALS — BP 139/67 | HR 65 | Temp 97.5°F | Resp 20 | Ht 61.0 in | Wt 152.6 lb

## 2020-07-07 DIAGNOSIS — I10 Essential (primary) hypertension: Secondary | ICD-10-CM

## 2020-07-07 NOTE — Progress Notes (Signed)
Location:   [place]:   [place] Provider:  Windell Moulding, AGNP-C  [place], Carlos American, NP  Patient Care Team: Lauree Chandler, NP as PCP - General (Nurse Practitioner)  Extended Emergency Contact Information Primary Emergency Contact: Ronney Asters, [place] Phone: [phone] Relation: Relative Secondary Emergency Contact: Jerald Kief Address: [place] [address] Johnnette Litter of [place] Phone: [phone] Work Phone: [phone] Relation: Son  Goals of care: Advanced Directive information Advanced Directives 07/07/2020  Does Patient Have a Medical Advance Directive? Yes  Type of Advance Directive Out of facility DNR (pink MOST or yellow form)  Does patient want to make changes to medical advance directive? No - Patient declined  Would patient like information on creating a medical advance directive? -  Pre-existing out of facility DNR order (yellow form or pink MOST form) Yellow form placed in chart (order not valid for inpatient use)     Chief Complaint  Patient presents with  . Acute Visit    Want to have Blood Pressure checked says it has been running high.    HPI:  Pt is a 80 y.o. female seen today for an acute visit for high blood pressure.   A few days ago, she had her blood pressure taken by a [employer] representative. Reports SBP > 170. Prior to having her blood pressure taken, she had received upsetting news. She has a blood pressure machine at home, unsure if she was using correctly. She brought machine with her today to check. Continues to take amlodipine, propanolol and losartan daily. Admits to eating salty foods. Denies chest pain, shortness of breath, headaches or blurred vision.   Discussed following low sodium diet. Discussed what foods to avoid and how to read food labels.     Past Medical History:  Diagnosis Date   . ALLERGIC RHINITIS   . Anxiety   . Asthma   . Blood type O+   . Cervical strain, acute   . Cirrhosis ([place])   . Colon polyp 2010   adenoma  . Diverticulosis of colon   . Dizziness   . Esophageal varices ([place])   . Fibromyalgia   . GERD (gastroesophageal reflux disease)   . History of nephrolithiasis   . Hypercholesterolemia    borderline  . Hypertension   . IBS (irritable bowel syndrome)   . Osteoarthritis   . Personal history of allergy to unspecified medicinal agent   . Postconcussion syndrome   . Vitamin D deficiency    Past Surgical History:  Procedure Laterality Date  . CHOLECYSTECTOMY, LAPAROSCOPIC  2004   for gallstone pancreatitis  . KNEE SURGERY Right   . KNEE SURGERY Left   . THYROID SURGERY     cyst removed    Allergies  Allergen Reactions  . Ace Inhibitors     cough  . Amoxicillin Itching    REACTION: unknown? pain in right kidney  . Benicar Hct [Olmesartan Medoxomil-Hctz]     Extreme weakness  . Budesonide-Formoterol Fumarate     Causes tremors and numbness  . Chlorzoxazone [Chlorzoxazone]   . Citalopram Hydrobromide     REACTION: hives  . Citalopram Hydrobromide   . Clonidine Derivatives   . Cymbalta [Duloxetine Hcl]   . Droperidol     REACTION: hives  . Flexeril [Cyclobenzaprine Hcl]   .  Fluconazole Nausea And Vomiting and Other (See Comments)    fatigue  . Gabapentin Itching  . Ketorolac Tromethamine     REACTION: hives  . Ketorolac Tromethamine   . Metoclopramide Hcl   . Mometasone Furo-Formoterol Fum     Causes sore throat, blurred vision and unable to sleep--started on med 09-17-10  . Morphine     REACTION: hives and itching  . Olmesartan Medoxomil     REACTION: fatique  . Breo Ellipta [Fluticasone Furoate-Vilanterol] Itching    Pt reports itching with Memory Dance is related to being lactose intolerant.     Outpatient Encounter Medications as of 07/07/2020  Medication Sig  . acetaminophen (TYLENOL) 500 MG tablet Take 1,000 mg by mouth 2  (two) times a day.  Marland Kitchen ADVAIR HFA 115-21 MCG/ACT inhaler TAKE 2 PUFFS BY MOUTH TWICE A DAY  . albuterol (PROAIR HFA) 108 (90 Base) MCG/ACT inhaler INHALE 2 PUFFS EVERY 4 HOURS AS NEEDED INTO THE LUNGS FOR WHEEZING OR SHORTNESS OF BREATH J45.20  . amLODipine (NORVASC) 5 MG tablet TAKE 1 TABLET BY MOUTH EVERY DAY  . apixaban (ELIQUIS) 5 MG TABS tablet Take 1 tablet (5 mg total) by mouth 2 (two) times daily.  Marland Kitchen esomeprazole (NEXIUM) 20 MG capsule Take 1 capsule (20 mg total) by mouth daily.  Marland Kitchen losartan (COZAAR) 25 MG tablet TAKE 1 TABLET BY MOUTH EVERY DAY  . meclizine (ANTIVERT) 25 MG tablet TAKE 1 TABLET BY MOUTH 3 TIMES A DAY AS NEEDED FOR DIZZINESS OR NAUSEA  . Omega-3 Fatty Acids (FISH OIL MAXIMUM STRENGTH) 1200 MG CAPS Take 1,200 mg by mouth 2 (two) times daily.   . propranolol (INDERAL) 10 MG tablet TAKE 1 TABLET BY MOUTH TWICE A DAY  . Propylene Glycol (SYSTANE BALANCE) 0.6 % SOLN Place 1 drop into both ears 2 (two) times daily.  . [DISCONTINUED] esomeprazole (NEXIUM) 40 MG capsule TAKE 1 TABLET BY MOUTH ONCE OR TWICE DAILY AS NEEDED   No facility-administered encounter medications on file as of 07/07/2020.    Review of Systems  Constitutional: Negative for activity change, appetite change and fever.  Respiratory: Negative for cough, shortness of breath and wheezing.   Cardiovascular: Negative for chest pain and leg swelling.  Neurological: Negative for dizziness, light-headedness and headaches.  Psychiatric/Behavioral: Negative for dysphoric mood. The patient is not nervous/anxious.     Immunization History  Administered Date(s) Administered  . Hepatitis A, Adult 10/15/2017, 04/17/2018  . Influenza Split 02/11/2011, 02/25/2012  . Influenza Whole 02/10/2009, 02/26/2010  . Influenza, High Dose Seasonal PF 04/24/2017, 01/26/2018, 01/26/2018, 01/03/2019  . Influenza,inj,Quad PF,6+ Mos 02/09/2014, 03/20/2015, 02/27/2016  . Influenza-Unspecified 03/13/2013, 03/13/2017  . PFIZER(Purple  Top)SARS-COV-2 Vaccination 06/03/2019, 06/22/2019  . Pneumococcal Conjugate-13 02/27/2016  . Pneumococcal Polysaccharide-23 08/25/2014  . Tdap 12/06/2013  . Zoster Recombinat (Shingrix) 01/13/2019, 08/05/2019   Pertinent  Health Maintenance Due  Topic Date Due  . INFLUENZA VACCINE  12/12/2019  . COLONOSCOPY (Pts 45-60yr Insurance coverage will need to be confirmed)  06/25/2020  . DEXA SCAN  Completed  . PNA vac Low Risk Adult  Completed   Fall Risk  07/07/2020 05/08/2020 11/04/2019 10/01/2019 07/07/2019  Falls in the past year? 0 0 0 0 0  Number falls in past yr: 0 0 0 0 0  Injury with Fall? 0 0 0 0 0  Follow up - - - - -   Functional Status Survey:    Vitals:   07/07/20 0902 07/07/20 0921  BP: 130/60 139/67  Pulse: 65  Resp: 20   Temp: (!) 97.5 F (36.4 C)   TempSrc: Temporal   SpO2: 97%   Weight: 152 lb 9.6 oz (69.2 kg)   Height: 5' 1"  (1.549 m)    Body mass index is 28.83 kg/m. Physical Exam Vitals reviewed.  Constitutional:      General: She is not in acute distress. Cardiovascular:     Rate and Rhythm: Normal rate and regular rhythm.     Pulses: Normal pulses.     Heart sounds: Normal heart sounds.  Pulmonary:     Effort: Pulmonary effort is normal. No respiratory distress.     Breath sounds: Normal breath sounds. No wheezing.  Musculoskeletal:     Right lower leg: No edema.     Left lower leg: No edema.  Neurological:     General: No focal deficit present.     Mental Status: She is alert and oriented to person, place, and time.  Psychiatric:        Mood and Affect: Mood normal.        Behavior: Behavior normal.     Labs reviewed: Recent Labs    11/25/19 0502 11/26/19 0433 11/27/19 1133 12/08/19 1154 05/08/20 1011  NA 137 141 135 139 141  K 3.3* 5.1 3.5 4.6 4.5  CL 107 110 103 103 106  CO2 20* 22 22 29 28   GLUCOSE 92 99 92 98 93  BUN 9 8 7* 13 20  CREATININE 0.62 0.62 0.51 0.94* 0.64  CALCIUM 9.0 9.4 9.1 10.5* 9.4  MG 1.7 1.9 1.7  --   --    PHOS 4.4 4.1 3.5  --   --    Recent Labs    11/23/19 0649 11/24/19 0454 11/25/19 0502 12/08/19 1154 05/08/20 1011  AST 26 23 23 23 20   ALT 18 18 16 17 12   ALKPHOS 56 58 52  --   --   BILITOT 0.9 0.4 0.8 0.8 0.4  PROT 6.2* 6.1* 5.5* 6.8 6.5  ALBUMIN 3.3* 3.1* 2.9*  --   --    Recent Labs    12/21/19 1052 01/11/20 1124 05/08/20 1011  WBC 8.1 8.1 6.1  NEUTROABS 4,058 3,596 1,879  HGB 13.2 13.6 13.0  HCT 39.5 40.7 39.0  MCV 91.0 90.6 87.4  PLT 324 385 273   Lab Results  Component Value Date   TSH 1.666 11/22/2019   Lab Results  Component Value Date   HGBA1C 5.7 (H) 05/08/2020   Lab Results  Component Value Date   CHOL 198 05/08/2020   HDL 59 05/08/2020   LDLCALC 118 (H) 05/08/2020   LDLDIRECT 102.5 07/02/2012   TRIG 99 05/08/2020   CHOLHDL 3.4 05/08/2020    Significant Diagnostic Results in last 30 days:  No results found.  Assessment/Plan - bp at goal today< 150/90, remains asymptomatic - suspect upsetting news and rushing to appointment reason for elevation - her blood pressure machine is calibrated  - recommend recording blood pressure 2 hours after taking AM meds and at night for 1 week - advised to contact PCP if average pressures > 150/90 - cont low sodium diet < 2000 mg /day  I provided 17 minutes of face-to-face time during this encounter.     Family/ staff Communication: plan discussed with patient  Labs/tests ordered:  none

## 2020-07-07 NOTE — Patient Instructions (Addendum)
Take blood pressure 2 hours AFTER taking blood pressure medication and once at night.   Record blood pressures for next week. If blood pressures average > 150/90 please schedule appointment with office.   Continue to follow low sodium diet   Hypertension, Adult Hypertension is another name for high blood pressure. High blood pressure forces your heart to work harder to pump blood. This can cause problems over time. There are two numbers in a blood pressure reading. There is a top number (systolic) over a bottom number (diastolic). It is best to have a blood pressure that is below 120/80. Healthy choices can help lower your blood pressure, or you may need medicine to help lower it. What are the causes? The cause of this condition is not known. Some conditions may be related to high blood pressure. What increases the risk?  Smoking.  Having type 2 diabetes mellitus, high cholesterol, or both.  Not getting enough exercise or physical activity.  Being overweight.  Having too much fat, sugar, calories, or salt (sodium) in your diet.  Drinking too much alcohol.  Having long-term (chronic) kidney disease.  Having a family history of high blood pressure.  Age. Risk increases with age.  Race. You may be at higher risk if you are African American.  Gender. Men are at higher risk than women before age 62. After age 43, women are at higher risk than men.  Having obstructive sleep apnea.  Stress. What are the signs or symptoms?  High blood pressure may not cause symptoms. Very high blood pressure (hypertensive crisis) may cause: ? Headache. ? Feelings of worry or nervousness (anxiety). ? Shortness of breath. ? Nosebleed. ? A feeling of being sick to your stomach (nausea). ? Throwing up (vomiting). ? Changes in how you see. ? Very bad chest pain. ? Seizures. How is this treated?  This condition is treated by making healthy lifestyle changes, such as: ? Eating healthy  foods. ? Exercising more. ? Drinking less alcohol.  Your health care provider may prescribe medicine if lifestyle changes are not enough to get your blood pressure under control, and if: ? Your top number is above 130. ? Your bottom number is above 80.  Your personal target blood pressure may vary. Follow these instructions at home: Eating and drinking  If told, follow the DASH eating plan. To follow this plan: ? Fill one half of your plate at each meal with fruits and vegetables. ? Fill one fourth of your plate at each meal with whole grains. Whole grains include whole-wheat pasta, brown rice, and whole-grain bread. ? Eat or drink low-fat dairy products, such as skim milk or low-fat yogurt. ? Fill one fourth of your plate at each meal with low-fat (lean) proteins. Low-fat proteins include fish, chicken without skin, eggs, beans, and tofu. ? Avoid fatty meat, cured and processed meat, or chicken with skin. ? Avoid pre-made or processed food.  Eat less than 1,500 mg of salt each day.  Do not drink alcohol if: ? Your doctor tells you not to drink. ? You are pregnant, may be pregnant, or are planning to become pregnant.  If you drink alcohol: ? Limit how much you use to:  0-1 drink a day for women.  0-2 drinks a day for men. ? Be aware of how much alcohol is in your drink. In the [place] one drink equals one 12 oz bottle of beer (355 mL), one 5 oz glass of wine (148 mL), or one 1 oz  glass of hard liquor (44 mL).   Lifestyle  Work with your doctor to stay at a healthy weight or to lose weight. Ask your doctor what the best weight is for you.  Get at least 30 minutes of exercise most days of the week. This may include walking, swimming, or biking.  Get at least 30 minutes of exercise that strengthens your muscles (resistance exercise) at least 3 days a week. This may include lifting weights or doing Pilates.  Do not use any products that contain nicotine or tobacco, such as  cigarettes, e-cigarettes, and chewing tobacco. If you need help quitting, ask your doctor.  Check your blood pressure at home as told by your doctor.  Keep all follow-up visits as told by your doctor. This is important.   Medicines  Take over-the-counter and prescription medicines only as told by your doctor. Follow directions carefully.  Do not skip doses of blood pressure medicine. The medicine does not work as well if you skip doses. Skipping doses also puts you at risk for problems.  Ask your doctor about side effects or reactions to medicines that you should watch for. Contact a doctor if you:  Think you are having a reaction to the medicine you are taking.  Have headaches that keep coming back (recurring).  Feel dizzy.  Have swelling in your ankles.  Have trouble with your vision. Get help right away if you:  Get a very bad headache.  Start to feel mixed up (confused).  Feel weak or numb.  Feel faint.  Have very bad pain in your: ? Chest. ? Belly (abdomen).  Throw up more than once.  Have trouble breathing. Summary  Hypertension is another name for high blood pressure.  High blood pressure forces your heart to work harder to pump blood.  For most people, a normal blood pressure is less than 120/80.  Making healthy choices can help lower blood pressure. If your blood pressure does not get lower with healthy choices, you may need to take medicine. This information is not intended to replace advice given to you by your health care provider. Make sure you discuss any questions you have with your health care provider. Document Revised: 01/07/2018 Document Reviewed: 01/07/2018 Elsevier Patient Education  2021 [employer].

## 2020-07-07 NOTE — Telephone Encounter (Signed)
I called patient to let her know the form she left to be filled out by Sherrie Mustache NP today was filled out and ready for pick up she said she would come by on Monday

## 2020-07-10 ENCOUNTER — Telehealth: Payer: Self-pay | Admitting: *Deleted

## 2020-07-10 DIAGNOSIS — H9203 Otalgia, bilateral: Secondary | ICD-10-CM

## 2020-07-10 MED ORDER — SYSTANE BALANCE 0.6 % OP SOLN: 2.0000 [drp] | Freq: Two times a day (BID) | OPHTHALMIC | 0 refills | Status: AC

## 2020-07-10 NOTE — Telephone Encounter (Signed)
Patient stopped by office and stated that she needed Systane Balance Eye Drops 2 drops am and 2 drips pm and Ear Wax Removal as needed added to her medication list.  Added. Patient also stated that she lost her Covid Immunization card from pharmacy. Stated that she is going to go to the pharmacy to get another one.

## 2020-07-19 ENCOUNTER — Other Ambulatory Visit: Payer: Self-pay

## 2020-07-19 ENCOUNTER — Ambulatory Visit: Payer: PPO | Admitting: Podiatry

## 2020-07-19 ENCOUNTER — Encounter: Payer: Self-pay | Admitting: Podiatry

## 2020-07-19 DIAGNOSIS — L84 Corns and callosities: Secondary | ICD-10-CM

## 2020-07-19 DIAGNOSIS — B351 Tinea unguium: Secondary | ICD-10-CM | POA: Diagnosis not present

## 2020-07-19 DIAGNOSIS — M79674 Pain in right toe(s): Secondary | ICD-10-CM

## 2020-07-19 DIAGNOSIS — M792 Neuralgia and neuritis, unspecified: Secondary | ICD-10-CM | POA: Diagnosis not present

## 2020-07-19 DIAGNOSIS — B070 Plantar wart: Secondary | ICD-10-CM | POA: Diagnosis not present

## 2020-07-19 DIAGNOSIS — M79675 Pain in left toe(s): Secondary | ICD-10-CM

## 2020-07-21 ENCOUNTER — Encounter: Payer: Self-pay | Admitting: Gastroenterology

## 2020-07-23 NOTE — Progress Notes (Signed)
Subjective: [name] is a 80 y.o. female patient seen today callus(es) plantar aspect of both feet and painful mycotic toenails b/l that are difficult to trim. Pain interferes with ambulation. Aggravating factors include wearing enclosed shoe gear. Pain is relieved with periodic professional debridement.  She also has h/o neuropathic pain and uses topical compounded neuropathy cream from [place].   Allergies  Allergen Reactions  . Ace Inhibitors     cough  . Amoxicillin Itching    REACTION: unknown? pain in right kidney  . Benicar Hct [Olmesartan Medoxomil-Hctz]     Extreme weakness  . Budesonide-Formoterol Fumarate     Causes tremors and numbness  . Chlorzoxazone Hives  . Chlorzoxazone [Chlorzoxazone]   . Citalopram Hydrobromide     REACTION: hives  . Citalopram Hydrobromide   . Clonidine Derivatives   . Cymbalta [Duloxetine Hcl]   . Droperidol     REACTION: hives  . Flexeril [Cyclobenzaprine Hcl]   . Fluconazole Nausea And Vomiting and Other (See Comments)    fatigue  . Formoterol Itching  . Gabapentin Itching  . Ketorolac Tromethamine     REACTION: hives  . Ketorolac Tromethamine   . Metoclopramide Hcl   . Mometasone Furo-Formoterol Fum     Causes sore throat, blurred vision and unable to sleep--started on med 09-17-10  . Mometasone Furoate Itching  . Morphine     REACTION: hives and itching  . Olmesartan Medoxomil     REACTION: fatique  . Breo Ellipta [Fluticasone Furoate-Vilanterol] Itching    Pt reports itching with Memory Dance is related to being lactose intolerant.     Objective: Physical Exam  General: [name] is a pleasant 80 y.o.  Caucasian female, in NAD. AAO x 3.   Vascular:  Capillary fill time to digits <3 seconds b/l. Palpable DP pulses b/l. Palpable PT pulses b/l. Pedal hair absent b/l Skin temperature gradient within normal limits b/l. No edema noted b/l.  Dermatological:  Pedal skin with normal turgor, texture and tone  bilaterally. No open wounds bilaterally. No interdigital macerations bilaterally. Toenails 1-5 b/l elongated, discolored, dystrophic, thickened, crumbly with subungual debris and tenderness to dorsal palpation. Hyperkeratotic lesion(s) submet head 2 right foot.  No erythema, no edema, no drainage, no fluctuance. Annular hyperkeratotic lesion noted with visible subcutaneous pinpoint capillaries noted submet head 3 left foot.   Musculoskeletal:  Normal muscle strength 5/5 to all lower extremity muscle groups bilaterally. No gross bony deformities bilaterally. No pain crepitus or joint limitation noted with ROM b/l.  Neurological:  Pt has subjective symptoms of neuropathy. Protective sensation intact 5/5 intact bilaterally with 10g monofilament b/l. Vibratory sensation intact b/l. Proprioception intact bilaterally.  Assessment and Plan:  1. Pain due to onychomycosis of toenails of both feet   2. Callus   3. Verruca plantaris   4. Neuropathic pain    -Examined patient. -No new findings. No new orders. -Toenails 1-5 b/l debrided in length and girth with sterile nail nipper and dremel without iatrogenic laceration. -Callus(es) submet head 2 right foot pared utilizing sterile scalpel blade without complication or incident. Total number debrided =1. -Discussed plantar wart and need for treatment. Lesion pared. Light bleeding of capillaries addressed with Lumicain Hemostatic Solution. She is to apply duct tape to lesion every other day. -Continue compounded topical neuropathy cream from [place]. -Patient to continue soft, supportive shoe gear daily. -Patient to report any pedal injuries to medical professional immediately. -Patient/POA to call should there be question/concern in the interim.  Return in about 3 months (around 10/19/2020) for nail trim.  Marzetta Board, DPM

## 2020-07-31 ENCOUNTER — Encounter: Payer: Self-pay | Admitting: Cardiovascular Disease

## 2020-07-31 ENCOUNTER — Ambulatory Visit: Payer: PPO | Admitting: Cardiovascular Disease

## 2020-07-31 ENCOUNTER — Other Ambulatory Visit: Payer: Self-pay

## 2020-07-31 VITALS — BP 132/58 | HR 68 | Ht 61.0 in | Wt 154.0 lb

## 2020-07-31 DIAGNOSIS — I480 Paroxysmal atrial fibrillation: Secondary | ICD-10-CM | POA: Diagnosis not present

## 2020-07-31 NOTE — Patient Instructions (Signed)
Medication Instructions:  Your physician recommends that you continue on your current medications as directed. Please refer to the Current Medication list given to you today.  *If you need a refill on your cardiac medications before your next appointment, please call your pharmacy*   Lab Work: none If you have labs (blood work) drawn today and your tests are completely normal, you will receive your results only by: Marland Kitchen MyChart Message (if you have MyChart) OR . A paper copy in the mail If you have any lab test that is abnormal or we need to change your treatment, we will call you to review the results.   Testing/Procedures: none   Follow-Up: At [facility], you and your health needs are our priority.  As part of our continuing mission to provide you with exceptional heart care, we have created designated Provider Care Teams.  These Care Teams include your primary Cardiologist (physician) and Advanced Practice Providers (APPs -  Physician Assistants and Nurse Practitioners) who all work together to provide you with the care you need, when you need it.  We recommend signing up for the patient portal called "MyChart".  Sign up information is provided on this After Visit Summary.  MyChart is used to connect with patients for Virtual Visits (Telemedicine).  Patients are able to view lab/test results, encounter notes, upcoming appointments, etc.  Non-urgent messages can be sent to your provider as well.   To learn more about what you can do with MyChart, go to [email or web address].    Your next appointment:   1 year(s)  The format for your next appointment:   In Person  Provider:   Lauree Chandler, MD   Other Instructions

## 2020-07-31 NOTE — Progress Notes (Signed)
Chief Complaint  Patient presents with  . Follow-up    Atrial fibrillation    History of Present Illness: 80 yo female with history of atrial fibrillation, anxiety, asthma, fibromyalgia, GERD, HTN, HLD, IBS and cirrhosis secondary to NASH with esophageal varices here today for cardiac follow up. I saw her as a new patient in September 2021 for management of atrial fibrillation. She was admitted to [facility] in July 2021 with fever/UTI and found to have atrial fibrillation. She was also treated for C diff. She was started on Eliquis and her beta blocker was continued. Echo 11/23/19 with LVEF=60-65%. No significant valve disease.She was in sinus at her office visit here in September 2021.   She is here today for follow up. The patient denies any chest pain, dyspnea, palpitations, lower extremity edema, orthopnea, PND, dizziness, near syncope or syncope.   Primary Care Physician: Lauree Chandler, NP   Past Medical History:  Diagnosis Date  . ALLERGIC RHINITIS   . Anxiety   . Asthma   . Blood type O+   . Cervical strain, acute   . Cirrhosis ([place])   . Colon polyp 2010   adenoma  . Diverticulosis of colon   . Dizziness   . Esophageal varices ([place])   . Fibromyalgia   . GERD (gastroesophageal reflux disease)   . History of nephrolithiasis   . Hypercholesterolemia    borderline  . Hypertension   . IBS (irritable bowel syndrome)   . Osteoarthritis   . Personal history of allergy to unspecified medicinal agent   . Postconcussion syndrome   . Vitamin D deficiency     Past Surgical History:  Procedure Laterality Date  . CHOLECYSTECTOMY, LAPAROSCOPIC  2004   for gallstone pancreatitis  . KNEE SURGERY Right   . KNEE SURGERY Left   . THYROID SURGERY     cyst removed    Current Outpatient Medications  Medication Sig Dispense Refill  . acetaminophen (TYLENOL) 500 MG tablet Take 1,000 mg by mouth 2 (two) times a day.    Marland Kitchen ADVAIR HFA 115-21 MCG/ACT inhaler TAKE 2 PUFFS BY MOUTH TWICE  A DAY 12 each 5  . albuterol (PROAIR HFA) 108 (90 Base) MCG/ACT inhaler INHALE 2 PUFFS EVERY 4 HOURS AS NEEDED INTO THE LUNGS FOR WHEEZING OR SHORTNESS OF BREATH J45.20 25.5 g 5  . amLODipine (NORVASC) 5 MG tablet TAKE 1 TABLET BY MOUTH EVERY DAY 90 tablet 1  . apixaban (ELIQUIS) 5 MG TABS tablet Take 1 tablet (5 mg total) by mouth 2 (two) times daily. 60 tablet 3  . carbamide peroxide (EAR WAX REMOVAL DROPS) 6.5 % OTIC solution 5 drops 2 (two) times daily as needed.    Marland Kitchen esomeprazole (NEXIUM) 20 MG capsule Take 1 capsule (20 mg total) by mouth daily. 90 capsule 3  . losartan (COZAAR) 25 MG tablet TAKE 1 TABLET BY MOUTH EVERY DAY 90 tablet 1  . meclizine (ANTIVERT) 25 MG tablet TAKE 1 TABLET BY MOUTH 3 TIMES A DAY AS NEEDED FOR DIZZINESS OR NAUSEA 90 tablet 1  . Omega-3 Fatty Acids (FISH OIL MAXIMUM STRENGTH) 1200 MG CAPS Take 1,200 mg by mouth 2 (two) times daily.     . propranolol (INDERAL) 10 MG tablet TAKE 1 TABLET BY MOUTH TWICE A DAY 180 tablet 1  . Propylene Glycol (SYSTANE BALANCE) 0.6 % SOLN Place 2 drops into both eyes 2 (two) times daily. 15 mL 0   No current facility-administered medications for this visit.  Allergies  Allergen Reactions  . Ace Inhibitors     cough  . Amoxicillin Itching    REACTION: unknown? pain in right kidney  . Benicar Hct [Olmesartan Medoxomil-Hctz]     Extreme weakness  . Budesonide-Formoterol Fumarate     Causes tremors and numbness  . Chlorzoxazone Hives  . Chlorzoxazone [Chlorzoxazone]   . Citalopram Hydrobromide     REACTION: hives  . Citalopram Hydrobromide   . Clonidine Derivatives   . Cymbalta [Duloxetine Hcl]   . Droperidol     REACTION: hives  . Flexeril [Cyclobenzaprine Hcl]   . Fluconazole Nausea And Vomiting and Other (See Comments)    fatigue  . Formoterol Itching  . Gabapentin Itching  . Ketorolac Tromethamine     REACTION: hives  . Ketorolac Tromethamine   . Metoclopramide Hcl   . Mometasone Furo-Formoterol Fum      Causes sore throat, blurred vision and unable to sleep--started on med 09-17-10  . Mometasone Furoate Itching  . Morphine     REACTION: hives and itching  . Olmesartan Medoxomil     REACTION: fatique  . Breo Ellipta [Fluticasone Furoate-Vilanterol] Itching    Pt reports itching with Memory Dance is related to being lactose intolerant.     Social History   Socioeconomic History  . Marital status: Divorced    Spouse name: Not on file  . Number of children: 1  . Years of education: Not on file  . Highest education level: Not on file  Occupational History  . Occupation: retired    Fish farm manager: [employer]    Comment: [employer], [employer]  Tobacco Use  . Smoking status: Former Smoker    Packs/day: 2.00    Years: 8.00    Pack years: 16.00    Types: Cigarettes    Quit date: 05/13/1962    Years since quitting: 58.2  . Smokeless tobacco: Never Used  Vaping Use  . Vaping Use: Never used  Substance and Sexual Activity  . Alcohol use: No  . Drug use: No  . Sexual activity: Not Currently    Birth control/protection: Post-menopausal  Other Topics Concern  . Not on file  Social History Narrative   exercises 3x a week   drinks 1 cup caffeine every other day   Social Determinants of Health   Financial Resource Strain: Not on file  Food Insecurity: Not on file  Transportation Needs: Not on file  Physical Activity: Not on file  Stress: Not on file  Social Connections: Not on file  Intimate Partner Violence: Not on file    Family History  Problem Relation Age of Onset  . Breast cancer Mother   . Alzheimer's disease Mother   . COPD Brother   . Alcohol abuse Brother   . Heart disease Sister   . Breast cancer Sister 58  . Alcohol abuse Sister   . Ovarian cancer Paternal Grandmother   . Arthritis Other        Cousins   . COPD Cousin        Maternal side   . Heart attack Maternal Uncle   . Colon cancer Neg Hx   . Esophageal cancer Neg Hx     Review of  Systems:  As stated in the HPI and otherwise negative.   BP (!) 132/58   Pulse 68   Ht 5' 1"  (1.549 m)   Wt 154 lb (69.9 kg)   SpO2 96%   BMI 29.10 kg/m   Physical Examination:  General: Well developed, well nourished, NAD  HEENT: OP clear, mucus membranes moist  SKIN: warm, dry. No rashes. Neuro: No focal deficits  Musculoskeletal: Muscle strength 5/5 all ext  Psychiatric: Mood and affect normal  Neck: No JVD, no carotid bruits, no thyromegaly, no lymphadenopathy.  Lungs:Clear bilaterally, no wheezes, rhonci, crackles Cardiovascular: Regular rate and rhythm. No murmurs, gallops or rubs. Abdomen:Soft. Bowel sounds present. Non-tender.  Extremities: No lower extremity edema. Pulses are 2 + in the bilateral DP/PT.  EKG:  EKG is not ordered today. The ekg ordered today demonstrates   Echo 11/23/19: 1. Left ventricular ejection fraction, by estimation, is 60 to 65%. The  left ventricle has normal function. The left ventricle has no regional  wall motion abnormalities. Left ventricular diastolic parameters are  indeterminate.  2. Right ventricular systolic function is normal. The right ventricular  size is normal. There is mildly elevated pulmonary artery systolic  pressure.  3. Left atrial size was mild to moderately dilated.  4. The mitral valve is normal in structure. Trivial mitral valve  regurgitation. No evidence of mitral stenosis.  5. The aortic valve is tricuspid. Aortic valve regurgitation is not  visualized. No aortic stenosis is present.  6. The inferior vena cava is normal in size with <50% respiratory  variability, suggesting right atrial pressure of 8 mmHg.   Recent Labs: 11/22/2019: TSH 1.666 11/27/2019: Magnesium 1.7 05/08/2020: ALT 12; BUN 20; Creat 0.64; Hemoglobin 13.0; Platelets 273; Potassium 4.5; Sodium 141   Lipid Panel    Component Value Date/Time   CHOL 198 05/08/2020 1011   CHOL 217 (H) 11/10/2015 0903   TRIG 99 05/08/2020 1011   HDL 59  05/08/2020 1011   HDL 44 11/10/2015 0903   CHOLHDL 3.4 05/08/2020 1011   VLDL 27 12/31/2016 0851   LDLCALC 118 (H) 05/08/2020 1011   LDLDIRECT 102.5 07/02/2012 1100     Wt Readings from Last 3 Encounters:  07/31/20 154 lb (69.9 kg)  07/07/20 152 lb 9.6 oz (69.2 kg)  05/08/20 148 lb (67.1 kg)      Assessment and Plan:   1. Atrial fibrillation, paroxysmal: Sinus today on exam. CHADS VASC score of 4. Will continue Eliquis and the beta blocker.   Current medicines are reviewed at length with the patient today.  The patient does not have concerns regarding medicines.  The following changes have been made:  no change  Labs/ tests ordered today include:   No orders of the defined types were placed in this encounter.  Disposition:   F/U with me in 12 months.   Signed, Lauree Chandler, MD 07/31/2020 10:33 AM    [facility] [address], [place], [place]  [address] Phone: [phone]; Fax: [phone]

## 2020-08-07 ENCOUNTER — Ambulatory Visit (INDEPENDENT_AMBULATORY_CARE_PROVIDER_SITE_OTHER): Payer: PPO | Admitting: Nurse Practitioner

## 2020-08-07 ENCOUNTER — Other Ambulatory Visit: Payer: Self-pay

## 2020-08-07 ENCOUNTER — Encounter: Payer: Self-pay | Admitting: Nurse Practitioner

## 2020-08-07 VITALS — BP 136/80 | HR 67 | Temp 96.9°F | Ht 61.0 in | Wt 150.0 lb

## 2020-08-07 DIAGNOSIS — I8500 Esophageal varices without bleeding: Secondary | ICD-10-CM | POA: Diagnosis not present

## 2020-08-07 DIAGNOSIS — I10 Essential (primary) hypertension: Secondary | ICD-10-CM

## 2020-08-07 DIAGNOSIS — J4520 Mild intermittent asthma, uncomplicated: Secondary | ICD-10-CM | POA: Diagnosis not present

## 2020-08-07 DIAGNOSIS — K7460 Unspecified cirrhosis of liver: Secondary | ICD-10-CM

## 2020-08-07 DIAGNOSIS — E782 Mixed hyperlipidemia: Secondary | ICD-10-CM

## 2020-08-07 DIAGNOSIS — K219 Gastro-esophageal reflux disease without esophagitis: Secondary | ICD-10-CM

## 2020-08-07 DIAGNOSIS — K5904 Chronic idiopathic constipation: Secondary | ICD-10-CM

## 2020-08-07 DIAGNOSIS — M545 Low back pain, unspecified: Secondary | ICD-10-CM

## 2020-08-07 DIAGNOSIS — M1711 Unilateral primary osteoarthritis, right knee: Secondary | ICD-10-CM

## 2020-08-07 DIAGNOSIS — I480 Paroxysmal atrial fibrillation: Secondary | ICD-10-CM

## 2020-08-07 MED ORDER — PREDNISONE 20 MG PO TABS: 20.0000 mg | ORAL_TABLET | Freq: Two times a day (BID) | ORAL | 0 refills | Status: DC

## 2020-08-07 NOTE — Patient Instructions (Signed)
Start prednisone today- twice daily with food  Ice knee  Continues to use heat to the back  Continue with tylenol as needed for back and knee  To get xray of knee, if pain persist after prednisone, ice, compression- you can get in with Dr Sabra Heck in office for knee possible injection

## 2020-08-07 NOTE — Progress Notes (Unsigned)
Careteam: Patient Care Team: Lauree Chandler, NP as PCP - General (Nurse Practitioner)  PLACE OF SERVICE:  [place] Directive information Does Patient Have a Medical Advance Directive?: Yes, Type of Advance Directive: Out of facility DNR (pink MOST or yellow form), Pre-existing out of facility DNR order (yellow form or pink MOST form): Yellow form placed in chart (order not valid for inpatient use);Pink MOST form placed in chart (order not valid for inpatient use), Does patient want to make changes to medical advance directive?: No - Patient declined  Allergies  Allergen Reactions  . Ace Inhibitors     cough  . Amoxicillin Itching    REACTION: unknown? pain in right kidney  . Benicar Hct [Olmesartan Medoxomil-Hctz]     Extreme weakness  . Budesonide-Formoterol Fumarate     Causes tremors and numbness  . Chlorzoxazone Hives  . Chlorzoxazone [Chlorzoxazone]   . Citalopram Hydrobromide     REACTION: hives  . Citalopram Hydrobromide   . Clonidine Derivatives   . Cymbalta [Duloxetine Hcl]   . Droperidol     REACTION: hives  . Flexeril [Cyclobenzaprine Hcl]   . Fluconazole Nausea And Vomiting and Other (See Comments)    fatigue  . Formoterol Itching  . Gabapentin Itching  . Ketorolac Tromethamine     REACTION: hives  . Ketorolac Tromethamine   . Metoclopramide Hcl   . Mometasone Furo-Formoterol Fum     Causes sore throat, blurred vision and unable to sleep--started on med 09-17-10  . Mometasone Furoate Itching  . Morphine     REACTION: hives and itching  . Olmesartan Medoxomil     REACTION: fatique  . Breo Ellipta [Fluticasone Furoate-Vilanterol] Itching    Pt reports itching with Memory Dance is related to being lactose intolerant.     Chief Complaint  Patient presents with  . Medical Management of Chronic Issues    3 month follow-up, left lower back pain- questions if related to carrying heavy groceries. Right knee swelling and pain. Discuss need for  colonoscopy.      HPI: Patient is a 80 y.o. female for routine follow up.   She knows she is due for colonoscopy but does not wish to have this repeated. Aware of need and risk.   She quit driving, lives across the stress from store. Had pretty heavy groceries and felt like that was a mistake.  Thought it was getting better yesterday but last night got worse. Can not get comfortable at night.  Ran out of tylenol.  Pain is 8 of 10. At night 10 of 10.  Using heating pad Does not tolerate NSAIDs due to stomach  htn- blood pressure at home ranging from 125-141/55-68, SBP <135 most of the time.   GERD- rarely with breakthrough symptoms.  COPD- breathing controlled on advair  Seasonal allergy- had bad attack with the first signs of spring.  Taking zyrtec   Continues to struggle with maintaining weight.   Review of Systems:  Review of Systems  Constitutional: Negative for chills, fever and weight loss.  HENT: Negative for tinnitus.   Respiratory: Negative for cough, sputum production and shortness of breath.   Cardiovascular: Negative for chest pain, palpitations and leg swelling.  Gastrointestinal: Negative for abdominal pain, constipation, diarrhea and heartburn.  Genitourinary: Negative for dysuria, frequency and urgency.  Musculoskeletal: Positive for joint pain and myalgias. Negative for back pain and falls.  Skin: Negative.   Neurological: Negative for dizziness and headaches.  Psychiatric/Behavioral: Negative for  depression and memory loss. The patient does not have insomnia.     Past Medical History:  Diagnosis Date  . ALLERGIC RHINITIS   . Anxiety   . Asthma   . Blood type O+   . Cervical strain, acute   . Cirrhosis ([place])   . Colon polyp 2010   adenoma  . Diverticulosis of colon   . Dizziness   . Esophageal varices ([place])   . Fibromyalgia   . GERD (gastroesophageal reflux disease)   . History of nephrolithiasis   . Hypercholesterolemia    borderline  .  Hypertension   . IBS (irritable bowel syndrome)   . Osteoarthritis   . Personal history of allergy to unspecified medicinal agent   . Postconcussion syndrome   . Vitamin D deficiency    Past Surgical History:  Procedure Laterality Date  . CHOLECYSTECTOMY, LAPAROSCOPIC  2004   for gallstone pancreatitis  . KNEE SURGERY Right   . KNEE SURGERY Left   . THYROID SURGERY     cyst removed   Social History:   reports that she quit smoking about 58 years ago. Her smoking use included cigarettes. She has a 16.00 pack-year smoking history. She has never used smokeless tobacco. She reports that she does not drink alcohol and does not use drugs.  Family History  Problem Relation Age of Onset  . Breast cancer Mother   . Alzheimer's disease Mother   . COPD Brother   . Alcohol abuse Brother   . Heart disease Sister   . Breast cancer Sister 83  . Alcohol abuse Sister   . Ovarian cancer Paternal Grandmother   . Arthritis Other        Cousins   . COPD Cousin        Maternal side   . Heart attack Maternal Uncle   . Colon cancer Neg Hx   . Esophageal cancer Neg Hx     Medications: Patient's Medications  New Prescriptions   No medications on file  Previous Medications   ACETAMINOPHEN (TYLENOL) 500 MG TABLET    Take 1,000 mg by mouth 2 (two) times a day.   ADVAIR HFA 115-21 MCG/ACT INHALER    TAKE 2 PUFFS BY MOUTH TWICE A DAY   ALBUTEROL (PROAIR HFA) 108 (90 BASE) MCG/ACT INHALER    INHALE 2 PUFFS EVERY 4 HOURS AS NEEDED INTO THE LUNGS FOR WHEEZING OR SHORTNESS OF BREATH J45.20   AMLODIPINE (NORVASC) 5 MG TABLET    TAKE 1 TABLET BY MOUTH EVERY DAY   APIXABAN (ELIQUIS) 5 MG TABS TABLET    Take 1 tablet (5 mg total) by mouth 2 (two) times daily.   CARBAMIDE PEROXIDE (EAR WAX REMOVAL DROPS) 6.5 % OTIC SOLUTION    5 drops 2 (two) times daily as needed.   ESOMEPRAZOLE (NEXIUM) 20 MG CAPSULE    Take 1 capsule (20 mg total) by mouth daily.   LOSARTAN (COZAAR) 25 MG TABLET    TAKE 1 TABLET BY  MOUTH EVERY DAY   MECLIZINE (ANTIVERT) 25 MG TABLET    TAKE 1 TABLET BY MOUTH 3 TIMES A DAY AS NEEDED FOR DIZZINESS OR NAUSEA   OMEGA-3 FATTY ACIDS (FISH OIL MAXIMUM STRENGTH) 1200 MG CAPS    Take 1,200 mg by mouth 2 (two) times daily.    PROPRANOLOL (INDERAL) 10 MG TABLET    TAKE 1 TABLET BY MOUTH TWICE A DAY   PROPYLENE GLYCOL (SYSTANE BALANCE) 0.6 % SOLN    Place 2 drops into  both eyes 2 (two) times daily.  Modified Medications   No medications on file  Discontinued Medications   No medications on file    Physical Exam:  Vitals:   08/07/20 0849  BP: 136/80  Pulse: 67  Temp: (!) 96.9 F (36.1 C)  TempSrc: Temporal  SpO2: 96%  Weight: 150 lb (68 kg)  Height: 5' 1"  (1.549 m)   Body mass index is 28.34 kg/m. Wt Readings from Last 3 Encounters:  08/07/20 150 lb (68 kg)  07/31/20 154 lb (69.9 kg)  07/07/20 152 lb 9.6 oz (69.2 kg)    Physical Exam Constitutional:      General: She is not in acute distress.    Appearance: She is well-developed. She is not diaphoretic.  HENT:     Head: Normocephalic and atraumatic.     Mouth/Throat:     Pharynx: No oropharyngeal exudate.  Eyes:     Conjunctiva/sclera: Conjunctivae normal.     Pupils: Pupils are equal, round, and reactive to light.  Cardiovascular:     Rate and Rhythm: Normal rate and regular rhythm.     Heart sounds: Normal heart sounds.  Pulmonary:     Effort: Pulmonary effort is normal.     Breath sounds: Normal breath sounds.  Abdominal:     General: Bowel sounds are normal.     Palpations: Abdomen is soft.  Musculoskeletal:        General: No tenderness.     Cervical back: Normal range of motion and neck supple.  Skin:    General: Skin is warm and dry.  Neurological:     Mental Status: She is alert and oriented to person, place, and time.     Labs reviewed: Basic Metabolic Panel: Recent Labs    11/22/19 1433 11/23/19 0649 11/25/19 0502 11/26/19 0433 11/27/19 1133 12/08/19 1154 05/08/20 1011  NA   --    < > 137 141 135 139 141  K  --    < > 3.3* 5.1 3.5 4.6 4.5  CL  --    < > 107 110 103 103 106  CO2  --    < > 20* 22 22 29 28   GLUCOSE  --    < > 92 99 92 98 93  BUN  --    < > 9 8 7* 13 20  CREATININE  --    < > 0.62 0.62 0.51 0.94* 0.64  CALCIUM  --    < > 9.0 9.4 9.1 10.5* 9.4  MG  --    < > 1.7 1.9 1.7  --   --   PHOS  --    < > 4.4 4.1 3.5  --   --   TSH 1.666  --   --   --   --   --   --    < > = values in this interval not displayed.   Liver Function Tests: Recent Labs    11/23/19 0649 11/24/19 0454 11/25/19 0502 12/08/19 1154 05/08/20 1011  AST 26 23 23 23 20   ALT 18 18 16 17 12   ALKPHOS 56 58 52  --   --   BILITOT 0.9 0.4 0.8 0.8 0.4  PROT 6.2* 6.1* 5.5* 6.8 6.5  ALBUMIN 3.3* 3.1* 2.9*  --   --    Recent Labs    11/19/19 1121  LIPASE 35   No results for input(s): AMMONIA in the last 8760 hours. CBC: Recent Labs    12/21/19  1052 01/11/20 1124 05/08/20 1011  WBC 8.1 8.1 6.1  NEUTROABS 4,058 3,596 1,879  HGB 13.2 13.6 13.0  HCT 39.5 40.7 39.0  MCV 91.0 90.6 87.4  PLT 324 385 273   Lipid Panel: Recent Labs    10/22/19 0904 05/08/20 1011  CHOL 230* 198  HDL 49* 59  LDLCALC 156* 118*  TRIG 130 99  CHOLHDL 4.7 3.4   TSH: Recent Labs    11/22/19 1433  TSH 1.666   A1C: Lab Results  Component Value Date   HGBA1C 5.7 (H) 05/08/2020     Assessment/Plan 1. Acute right-sided low back pain without sciatica -significant flare of low back pain, pt with hx of NSAID intolerance.  - predniSONE (DELTASONE) 20 MG tablet; Take 1 tablet (20 mg total) by mouth 2 (two) times daily with a meal.  Dispense: 6 tablet; Refill: 0 -to use heating pad TID ~20 mins -consider PT if no improvement with current regimen.   2. Essential hypertension -controlled on current regimen. Continue dietary modifications with propranolol, losartan and norvasc  3. Paroxysmal atrial fibrillation (HCC) -rate controlled on propranolol. No signs of bleeding on eliquis.    4. Gastroesophageal reflux disease without esophagitis -stable at this time. Continues on nexium daily   5. Mixed hyperlipidemia LDL improved to 118 on recent labs, not on statin due to liver disease and pt preference.   6. Hepatic cirrhosis, unspecified hepatic cirrhosis type, unspecified whether ascites present ([place]) -stable at this time.   7. Chronic idiopathic constipation Controlled on current regimen.  8. Asthma, allergic, mild intermittent, uncomplicated Doing well at this time. Allergy season has started so recommend daily nasal rinse.   9. Primary osteoarthritis of right knee -low dose prednisone should help knee as well. Consider injection in future if needed -tylenol PRN -ice PRN - DG Knee Complete 4 Views Right; Future  10. Esophageal varices without bleeding, unspecified esophageal varices type (HCC) Stable, follwed by GI, continues on atenolol.   Next appt: 3 months. Carlos American. [place], [facility] [phone]

## 2020-08-11 ENCOUNTER — Telehealth: Payer: Self-pay

## 2020-08-11 DIAGNOSIS — G8929 Other chronic pain: Secondary | ICD-10-CM

## 2020-08-11 NOTE — Telephone Encounter (Signed)
Left message on voicemail for patient to return call when available   

## 2020-08-11 NOTE — Telephone Encounter (Signed)
Incoming call received from patient stating she would like 1 more round of prednisone for the pain in her back. Patient rates pain 8/10, and says pain is the same as when last seen on 08/07/2020.  Please advise

## 2020-08-11 NOTE — Telephone Encounter (Signed)
She has significant disc disease that was noted on imaging back in 2021, if the prednisone did not help would recommend evaluation by the back specialist at this time, will place referral. Also would recommend PT- can order home health since she does not drive.  Continue tylenol PRN

## 2020-08-11 NOTE — Telephone Encounter (Signed)
Spoke with patient, patient states she will hold off on referral to specialist at this time. Patient is in agreement with home health physical therapy.

## 2020-08-28 DIAGNOSIS — H9313 Tinnitus, bilateral: Secondary | ICD-10-CM | POA: Insufficient documentation

## 2020-08-28 DIAGNOSIS — H9113 Presbycusis, bilateral: Secondary | ICD-10-CM | POA: Diagnosis not present

## 2020-08-28 DIAGNOSIS — H903 Sensorineural hearing loss, bilateral: Secondary | ICD-10-CM | POA: Diagnosis not present

## 2020-09-19 ENCOUNTER — Other Ambulatory Visit: Payer: Self-pay | Admitting: Nurse Practitioner

## 2020-09-19 DIAGNOSIS — I10 Essential (primary) hypertension: Secondary | ICD-10-CM

## 2020-09-19 NOTE — Telephone Encounter (Signed)
Pharmacy requested rx's Pended and sent to [facility] for approval due to [place].

## 2020-09-21 DIAGNOSIS — M419 Scoliosis, unspecified: Secondary | ICD-10-CM | POA: Diagnosis not present

## 2020-09-21 DIAGNOSIS — G629 Polyneuropathy, unspecified: Secondary | ICD-10-CM | POA: Insufficient documentation

## 2020-09-21 DIAGNOSIS — M461 Sacroiliitis, not elsewhere classified: Secondary | ICD-10-CM | POA: Diagnosis not present

## 2020-09-24 ENCOUNTER — Other Ambulatory Visit: Payer: Self-pay | Admitting: Nurse Practitioner

## 2020-09-24 DIAGNOSIS — I10 Essential (primary) hypertension: Secondary | ICD-10-CM

## 2020-09-24 DIAGNOSIS — R809 Proteinuria, unspecified: Secondary | ICD-10-CM

## 2020-09-24 DIAGNOSIS — R7303 Prediabetes: Secondary | ICD-10-CM

## 2020-09-25 NOTE — Telephone Encounter (Signed)
Pharmacy requested refill. Pended Rx and sent to [name] for approval due to HIGH ALERT Warning.  

## 2020-09-26 ENCOUNTER — Other Ambulatory Visit: Payer: Self-pay | Admitting: Nurse Practitioner

## 2020-09-29 ENCOUNTER — Other Ambulatory Visit: Payer: Self-pay | Admitting: Nurse Practitioner

## 2020-09-29 DIAGNOSIS — Z1231 Encounter for screening mammogram for malignant neoplasm of breast: Secondary | ICD-10-CM

## 2020-10-02 ENCOUNTER — Other Ambulatory Visit: Payer: Self-pay

## 2020-10-02 ENCOUNTER — Encounter: Payer: PPO | Admitting: Nurse Practitioner

## 2020-10-31 ENCOUNTER — Ambulatory Visit: Payer: PPO | Admitting: Podiatry

## 2020-10-31 ENCOUNTER — Other Ambulatory Visit: Payer: Self-pay

## 2020-10-31 ENCOUNTER — Encounter: Payer: Self-pay | Admitting: Podiatry

## 2020-10-31 DIAGNOSIS — B070 Plantar wart: Secondary | ICD-10-CM

## 2020-10-31 DIAGNOSIS — L84 Corns and callosities: Secondary | ICD-10-CM

## 2020-10-31 DIAGNOSIS — M79675 Pain in left toe(s): Secondary | ICD-10-CM | POA: Diagnosis not present

## 2020-10-31 DIAGNOSIS — M79674 Pain in right toe(s): Secondary | ICD-10-CM

## 2020-10-31 DIAGNOSIS — B351 Tinea unguium: Secondary | ICD-10-CM | POA: Diagnosis not present

## 2020-10-31 NOTE — Patient Instructions (Signed)
Apply a small piece of duct tape to plantar wart on bottom of left foot once daily after bath or shower.

## 2020-10-31 NOTE — Progress Notes (Signed)
Subjective: [name] is a 80 y.o. female patient seen today for plantars wart left foot, callus(es) plantar aspect of left feet and painful mycotic toenails b/l that are difficult to trim. Pain interferes with ambulation. Aggravating factors include wearing enclosed shoe gear. Pain is relieved with periodic professional debridement.  She also has h/o neuropathic pain and uses topical compounded neuropathy cream from [place].   She states she hasn't been consistent applying the duct tape to her left foot as instructed.  PCP is Sherrie Mustache, NP, and last visit was 05/08/2020.  Allergies  Allergen Reactions   Ace Inhibitors     cough   Amoxicillin Itching    REACTION: unknown? pain in right kidney   Benicar Hct [Olmesartan Medoxomil-Hctz]     Extreme weakness   Budesonide-Formoterol Fumarate     Causes tremors and numbness   Chlorzoxazone Hives   Chlorzoxazone [Chlorzoxazone]    Citalopram Hydrobromide     REACTION: hives   Citalopram Hydrobromide    Clonidine Derivatives    Cymbalta [Duloxetine Hcl]    Droperidol     REACTION: hives   Flexeril [Cyclobenzaprine Hcl]    Fluconazole Nausea And Vomiting and Other (See Comments)    fatigue   Formoterol Itching   Gabapentin Itching   Ketorolac Tromethamine     REACTION: hives   Ketorolac Tromethamine    Metoclopramide Hcl    Mometasone Furo-Formoterol Fum     Causes sore throat, blurred vision and unable to sleep--started on med 09-17-10   Mometasone Furoate Itching   Morphine     REACTION: hives and itching   Olmesartan Medoxomil     REACTION: fatique   Breo Ellipta [Fluticasone Furoate-Vilanterol] Itching    Pt reports itching with Memory Dance is related to being lactose intolerant.     Objective: Physical Exam  General: [name] is a pleasant 80 y.o.  Caucasian female, in NAD. AAO x 3.   Vascular:  Capillary fill time to digits <3 seconds b/l. Palpable DP pulses b/l. Palpable PT pulses b/l. Pedal  hair absent b/l Skin temperature gradient within normal limits b/l. No edema noted b/l.  Dermatological:  Pedal skin with normal turgor, texture and tone bilaterally. No open wounds bilaterally. No interdigital macerations bilaterally. Toenails 1-5 b/l elongated, discolored, dystrophic, thickened, crumbly with subungual debris and tenderness to dorsal palpation. Hyperkeratotic lesion(s) submet head 2 right foot.  No erythema, no edema, no drainage, no fluctuance. Annular hyperkeratotic lesion noted with visible subcutaneous pinpoint capillaries noted submet head 3 left foot.   Musculoskeletal:  Normal muscle strength 5/5 to all lower extremity muscle groups bilaterally. No gross bony deformities bilaterally. No pain crepitus or joint limitation noted with ROM b/l.  Neurological:  Pt has subjective symptoms of neuropathy. Protective sensation intact 5/5 intact bilaterally with 10g monofilament b/l. Vibratory sensation intact b/l. Proprioception intact bilaterally.  Assessment and Plan:  1. Pain due to onychomycosis of toenails of both feet   2. Callus   3. Verruca plantaris   -Examined patient. -Toenails 1-5 b/l debrided in length and girth with sterile nail nipper and dremel without iatrogenic laceration. -Callus(es) submet head 2 right foot pared utilizing sterile scalpel blade without complication or incident. Total number debrided =1. For plantar's wart, it is about the same as last visit as she has not been applying duct tape to the lesion.  Lesion pared to pinpoint She is to continue to apply duct tape to lesion on left foot once daily after bath or  shower. She related understanding of instructions. -Patient/POA to call should there be question/concern in the interim.  Return in about 3 months (around 01/31/2021).  Marzetta Board, DPM

## 2020-11-06 ENCOUNTER — Other Ambulatory Visit: Payer: Self-pay

## 2020-11-06 ENCOUNTER — Encounter: Payer: Self-pay | Admitting: Nurse Practitioner

## 2020-11-06 ENCOUNTER — Ambulatory Visit (INDEPENDENT_AMBULATORY_CARE_PROVIDER_SITE_OTHER): Payer: PPO | Admitting: Nurse Practitioner

## 2020-11-06 VITALS — BP 140/71 | HR 66 | Temp 97.8°F | Ht 61.0 in | Wt 161.8 lb

## 2020-11-06 DIAGNOSIS — I480 Paroxysmal atrial fibrillation: Secondary | ICD-10-CM

## 2020-11-06 DIAGNOSIS — M545 Low back pain, unspecified: Secondary | ICD-10-CM | POA: Diagnosis not present

## 2020-11-06 DIAGNOSIS — I10 Essential (primary) hypertension: Secondary | ICD-10-CM

## 2020-11-06 DIAGNOSIS — G8929 Other chronic pain: Secondary | ICD-10-CM | POA: Diagnosis not present

## 2020-11-06 DIAGNOSIS — E782 Mixed hyperlipidemia: Secondary | ICD-10-CM | POA: Diagnosis not present

## 2020-11-06 DIAGNOSIS — J309 Allergic rhinitis, unspecified: Secondary | ICD-10-CM | POA: Diagnosis not present

## 2020-11-06 DIAGNOSIS — R739 Hyperglycemia, unspecified: Secondary | ICD-10-CM

## 2020-11-06 DIAGNOSIS — E559 Vitamin D deficiency, unspecified: Secondary | ICD-10-CM | POA: Diagnosis not present

## 2020-11-06 DIAGNOSIS — K7460 Unspecified cirrhosis of liver: Secondary | ICD-10-CM | POA: Diagnosis not present

## 2020-11-06 DIAGNOSIS — J4520 Mild intermittent asthma, uncomplicated: Secondary | ICD-10-CM

## 2020-11-06 DIAGNOSIS — K219 Gastro-esophageal reflux disease without esophagitis: Secondary | ICD-10-CM | POA: Diagnosis not present

## 2020-11-06 NOTE — Progress Notes (Signed)
Careteam: Patient Care Team: Lauree Chandler, NP as PCP - General (Nurse Practitioner)  PLACE OF SERVICE:  [place]  Advanced Directive information Does patient want to make changes to medical advance directive?: No - Patient declined  Allergies  Allergen Reactions   Ace Inhibitors     cough   Amoxicillin Itching    REACTION: unknown? pain in right kidney   Benicar Hct [Olmesartan Medoxomil-Hctz]     Extreme weakness   Budesonide-Formoterol Fumarate     Causes tremors and numbness   Chlorzoxazone Hives   Chlorzoxazone [Chlorzoxazone]    Citalopram Hydrobromide     REACTION: hives   Citalopram Hydrobromide    Clonidine Derivatives    Cymbalta [Duloxetine Hcl]    Droperidol     REACTION: hives   Flexeril [Cyclobenzaprine Hcl]    Fluconazole Nausea And Vomiting and Other (See Comments)    fatigue   Formoterol Itching   Gabapentin Itching   Ketorolac Tromethamine     REACTION: hives   Ketorolac Tromethamine    Metoclopramide Hcl    Mometasone Furo-Formoterol Fum     Causes sore throat, blurred vision and unable to sleep--started on med 09-17-10   Mometasone Furoate Itching   Morphine     REACTION: hives and itching   Olmesartan Medoxomil     REACTION: fatique   Breo Ellipta [Fluticasone Furoate-Vilanterol] Itching    Pt reports itching with Memory Dance is related to being lactose intolerant.     Chief Complaint  Patient presents with   Medical Management of Chronic Issues    3 month follow up. Patient would like to discuss weight gain.Discuss Advanced directives.     HPI: Patient is a 80 y.o. female for routine follow up.  Has gained weight- reports she has been under a lot of stressed.  She went off her diet and went back to the wrong.  Pains that she did not have are coming back. Coughing is worse.  Craving sweets.   Son is now living here. He is not able to help her financially any more but thankful he is here.   Follows with neurosurgery due to  chronic back pain.   Review of Systems:  Review of Systems  Constitutional:  Negative for chills, fever and weight loss.  HENT:  Negative for tinnitus.   Respiratory:  Negative for cough, sputum production and shortness of breath.   Cardiovascular:  Negative for chest pain, palpitations and leg swelling.  Gastrointestinal:  Negative for abdominal pain, constipation, diarrhea and heartburn.  Genitourinary:  Negative for dysuria, frequency and urgency.  Musculoskeletal:  Positive for back pain. Negative for falls, joint pain and myalgias.  Skin: Negative.   Neurological:  Negative for dizziness and headaches.  Psychiatric/Behavioral:  Negative for depression and memory loss. The patient does not have insomnia.    Past Medical History:  Diagnosis Date   ALLERGIC RHINITIS    Anxiety    Asthma    Blood type O+    Cervical strain, acute    Cirrhosis ([place])    Colon polyp 2010   adenoma   Diverticulosis of colon    Dizziness    Esophageal varices (HCC)    Fibromyalgia    GERD (gastroesophageal reflux disease)    History of nephrolithiasis    Hypercholesterolemia    borderline   Hypertension    IBS (irritable bowel syndrome)    Osteoarthritis    Personal history of allergy to unspecified medicinal agent  Postconcussion syndrome    Vitamin D deficiency    Past Surgical History:  Procedure Laterality Date   CHOLECYSTECTOMY, LAPAROSCOPIC  2004   for gallstone pancreatitis   KNEE SURGERY Right    KNEE SURGERY Left    THYROID SURGERY     cyst removed   Social History:   reports that she quit smoking about 58 years ago. Her smoking use included cigarettes. She has a 16.00 pack-year smoking history. She has never used smokeless tobacco. She reports that she does not drink alcohol and does not use drugs.  Family History  Problem Relation Age of Onset   Breast cancer Mother    Alzheimer's disease Mother    COPD Brother    Alcohol abuse Brother    Heart disease Sister     Breast cancer Sister 43   Alcohol abuse Sister    Ovarian cancer Paternal Grandmother    Arthritis Other        Cousins    COPD Cousin        Maternal side    Heart attack Maternal Uncle    Colon cancer Neg Hx    Esophageal cancer Neg Hx     Medications: Patient's Medications  New Prescriptions   No medications on file  Previous Medications   ACETAMINOPHEN (TYLENOL) 500 MG TABLET    Take 1,000 mg by mouth 2 (two) times a day.   ADVAIR HFA 115-21 MCG/ACT INHALER    TAKE 2 PUFFS BY MOUTH TWICE A DAY   ALBUTEROL (PROAIR HFA) 108 (90 BASE) MCG/ACT INHALER    INHALE 2 PUFFS EVERY 4 HOURS AS NEEDED INTO THE LUNGS FOR WHEEZING OR SHORTNESS OF BREATH J45.20   AMLODIPINE (NORVASC) 5 MG TABLET    TAKE 1 TABLET BY MOUTH EVERY DAY   APIXABAN (ELIQUIS) 2.5 MG TABS TABLET    Take by mouth.   CARBAMIDE PEROXIDE (EAR WAX REMOVAL DROPS) 6.5 % OTIC SOLUTION    5 drops 2 (two) times daily as needed.   ESOMEPRAZOLE (NEXIUM) 20 MG CAPSULE    Take 1 capsule (20 mg total) by mouth daily.   LOSARTAN (COZAAR) 25 MG TABLET    TAKE 1 TABLET BY MOUTH EVERY DAY   MECLIZINE (ANTIVERT) 25 MG TABLET    TAKE 1 TABLET BY MOUTH 3 TIMES A DAY AS NEEDED FOR DIZZINESS OR NAUSEA   OMEGA-3 FATTY ACIDS (FISH OIL MAXIMUM STRENGTH) 1200 MG CAPS    Take 1,200 mg by mouth 2 (two) times daily.    PROPRANOLOL (INDERAL) 10 MG TABLET    TAKE 1 TABLET BY MOUTH TWICE A DAY   PROPYLENE GLYCOL (SYSTANE BALANCE) 0.6 % SOLN    Place 2 drops into both eyes 2 (two) times daily.  Modified Medications   No medications on file  Discontinued Medications   LOSARTAN (COZAAR) 25 MG TABLET    Take by mouth.   PREDNISONE (DELTASONE) 20 MG TABLET    Take 1 tablet (20 mg total) by mouth 2 (two) times daily with a meal.    Physical Exam:  Vitals:   11/06/20 0914  BP: 140/71  Pulse: 66  Temp: 97.8 F (36.6 C)  TempSrc: Temporal  SpO2: 97%  Weight: 161 lb 12.8 oz (73.4 kg)  Height: 5' 1"  (1.549 m)   Body mass index is 30.57 kg/m. Wt  Readings from Last 3 Encounters:  11/06/20 161 lb 12.8 oz (73.4 kg)  08/07/20 150 lb (68 kg)  07/31/20 154 lb (69.9 kg)  Physical Exam Constitutional:      General: She is not in acute distress.    Appearance: She is well-developed. She is not diaphoretic.  HENT:     Head: Normocephalic and atraumatic.     Mouth/Throat:     Pharynx: No oropharyngeal exudate.  Eyes:     Conjunctiva/sclera: Conjunctivae normal.     Pupils: Pupils are equal, round, and reactive to light.  Cardiovascular:     Rate and Rhythm: Normal rate and regular rhythm.     Heart sounds: Normal heart sounds.  Pulmonary:     Effort: Pulmonary effort is normal.     Breath sounds: Normal breath sounds.  Abdominal:     General: Bowel sounds are normal.     Palpations: Abdomen is soft.  Musculoskeletal:     Cervical back: Normal range of motion and neck supple.     Right lower leg: No edema.     Left lower leg: No edema.  Skin:    General: Skin is warm and dry.  Neurological:     Mental Status: She is alert.  Psychiatric:        Mood and Affect: Mood normal.    Labs reviewed: Basic Metabolic Panel: Recent Labs    11/22/19 1433 11/23/19 0649 11/25/19 0502 11/26/19 0433 11/27/19 1133 12/08/19 1154 05/08/20 1011  NA  --    < > 137 141 135 139 141  K  --    < > 3.3* 5.1 3.5 4.6 4.5  CL  --    < > 107 110 103 103 106  CO2  --    < > 20* 22 22 29 28   GLUCOSE  --    < > 92 99 92 98 93  BUN  --    < > 9 8 7* 13 20  CREATININE  --    < > 0.62 0.62 0.51 0.94* 0.64  CALCIUM  --    < > 9.0 9.4 9.1 10.5* 9.4  MG  --    < > 1.7 1.9 1.7  --   --   PHOS  --    < > 4.4 4.1 3.5  --   --   TSH 1.666  --   --   --   --   --   --    < > = values in this interval not displayed.   Liver Function Tests: Recent Labs    11/23/19 0649 11/24/19 0454 11/25/19 0502 12/08/19 1154 05/08/20 1011  AST 26 23 23 23 20   ALT 18 18 16 17 12   ALKPHOS 56 58 52  --   --   BILITOT 0.9 0.4 0.8 0.8 0.4  PROT 6.2* 6.1* 5.5*  6.8 6.5  ALBUMIN 3.3* 3.1* 2.9*  --   --    Recent Labs    11/19/19 1121  LIPASE 35   No results for input(s): AMMONIA in the last 8760 hours. CBC: Recent Labs    12/21/19 1052 01/11/20 1124 05/08/20 1011  WBC 8.1 8.1 6.1  NEUTROABS 4,058 3,596 1,879  HGB 13.2 13.6 13.0  HCT 39.5 40.7 39.0  MCV 91.0 90.6 87.4  PLT 324 385 273   Lipid Panel: Recent Labs    05/08/20 1011  CHOL 198  HDL 59  LDLCALC 118*  TRIG 99  CHOLHDL 3.4   TSH: Recent Labs    11/22/19 1433  TSH 1.666   A1C: Lab Results  Component Value Date   HGBA1C 5.7 (H) 05/08/2020  Assessment/Plan 1. Chronic right-sided low back pain without sciatica -continues to follow up with neurosurgery. Tylenol as been effective.   2. Essential hypertension -controlled on current regimen.  - COMPLETE METABOLIC PANEL WITH GFR  3. Paroxysmal atrial fibrillation (HCC) -rate controlled, continue on eliquis for anticoagulation.  - CBC with Differential/Platelet  4. Gastroesophageal reflux disease without esophagitis Stable on nexium   5. Mixed hyperlipidemia -continue with lifestyle modification.  - Lipid Panel  6. Hepatic cirrhosis, unspecified hepatic cirrhosis type, unspecified whether ascites present ([place]) -continues on propranolol, encouraged dietary modification, low fat diet.  - COMPLETE METABOLIC PANEL WITH GFR  7. Hyperglycemia -dietary modifications encouraged - Hemoglobin A1c  8. Asthma, allergic, mild intermittent, uncomplicated -stable, no recent flares.  9. Allergic rhinitis, unspecified seasonality, unspecified trigger Controlled on zyrtec   10. Vitamin D deficiency Continues on vit D supplement.    Next appt: 3 months.  Carlos American. [place], [facility] [phone]

## 2020-11-07 ENCOUNTER — Other Ambulatory Visit: Payer: Self-pay

## 2020-11-07 DIAGNOSIS — E782 Mixed hyperlipidemia: Secondary | ICD-10-CM

## 2020-11-07 LAB — COMPLETE METABOLIC PANEL WITH GFR
AG Ratio: 1.7 (calc) (ref 1.0–2.5)
ALT: 15 U/L (ref 6–29)
AST: 25 U/L (ref 10–35)
Albumin: 4.5 g/dL (ref 3.6–5.1)
Alkaline phosphatase (APISO): 104 U/L (ref 37–153)
BUN: 25 mg/dL (ref 7–25)
CO2: 26 mmol/L (ref 20–32)
Calcium: 9.9 mg/dL (ref 8.6–10.4)
Chloride: 103 mmol/L (ref 98–110)
Creat: 0.68 mg/dL (ref 0.60–0.88)
GFR, Est African American: 94 mL/min/{1.73_m2} (ref >=60)
GFR, Est Non African American: 81 mL/min/{1.73_m2} (ref >=60)
Globulin: 2.6 g/dL (calc) (ref 1.9–3.7)
Glucose, Bld: 101 mg/dL — ABNORMAL HIGH (ref 65–99)
Potassium: 4.5 mmol/L (ref 3.5–5.3)
Sodium: 139 mmol/L (ref 135–146)
Total Bilirubin: 0.8 mg/dL (ref 0.2–1.2)
Total Protein: 7.1 g/dL (ref 6.1–8.1)

## 2020-11-07 LAB — LIPID PANEL
Cholesterol: 233 mg/dL — ABNORMAL HIGH (ref <200)
HDL: 65 mg/dL (ref >=50)
LDL Cholesterol (Calc): 140 mg/dL (calc) — ABNORMAL HIGH
Non-HDL Cholesterol (Calc): 168 mg/dL (calc) — ABNORMAL HIGH (ref <130)
Total CHOL/HDL Ratio: 3.6 (calc) (ref <5.0)
Triglycerides: 149 mg/dL (ref <150)

## 2020-11-07 LAB — CBC WITH DIFFERENTIAL/PLATELET
Absolute Monocytes: 734 cells/uL (ref 200–950)
Basophils Absolute: 48 cells/uL (ref 0–200)
Basophils Relative: 0.7 %
Eosinophils Absolute: 136 cells/uL (ref 15–500)
Eosinophils Relative: 2 %
HCT: 42.7 % (ref 35.0–45.0)
Hemoglobin: 14.1 g/dL (ref 11.7–15.5)
Lymphs Abs: 3726 cells/uL (ref 850–3900)
MCH: 29.3 pg (ref 27.0–33.0)
MCHC: 33 g/dL (ref 32.0–36.0)
MCV: 88.8 fL (ref 80.0–100.0)
MPV: 9.9 fL (ref 7.5–12.5)
Monocytes Relative: 10.8 %
Neutro Abs: 2156 cells/uL (ref 1500–7800)
Neutrophils Relative %: 31.7 %
Platelets: 334 10*3/uL (ref 140–400)
RBC: 4.81 10*6/uL (ref 3.80–5.10)
RDW: 13.3 % (ref 11.0–15.0)
Total Lymphocyte: 54.8 %
WBC: 6.8 10*3/uL (ref 3.8–10.8)

## 2020-11-07 LAB — HEMOGLOBIN A1C
Hgb A1c MFr Bld: 5.5 % of total Hgb (ref <5.7)
Mean Plasma Glucose: 111 mg/dL
eAG (mmol/L): 6.2 mmol/L

## 2020-11-22 DIAGNOSIS — Z961 Presence of intraocular lens: Secondary | ICD-10-CM | POA: Diagnosis not present

## 2020-11-22 DIAGNOSIS — H5203 Hypermetropia, bilateral: Secondary | ICD-10-CM | POA: Diagnosis not present

## 2020-11-22 DIAGNOSIS — H524 Presbyopia: Secondary | ICD-10-CM | POA: Diagnosis not present

## 2020-11-27 ENCOUNTER — Ambulatory Visit
Admission: RE | Admit: 2020-11-27 | Discharge: 2020-11-27 | Disposition: A | Discharge status: Home / Self Care | Payer: PPO | Source: Ambulatory Visit | Attending: Nurse Practitioner | Admitting: Nurse Practitioner

## 2020-11-27 ENCOUNTER — Other Ambulatory Visit: Payer: Self-pay

## 2020-11-27 DIAGNOSIS — Z1231 Encounter for screening mammogram for malignant neoplasm of breast: Secondary | ICD-10-CM | POA: Diagnosis not present

## 2020-11-29 DIAGNOSIS — M545 Low back pain, unspecified: Secondary | ICD-10-CM | POA: Diagnosis not present

## 2020-11-29 DIAGNOSIS — M533 Sacrococcygeal disorders, not elsewhere classified: Secondary | ICD-10-CM | POA: Diagnosis not present

## 2020-12-07 ENCOUNTER — Ambulatory Visit (INDEPENDENT_AMBULATORY_CARE_PROVIDER_SITE_OTHER): Payer: PPO | Admitting: Nurse Practitioner

## 2020-12-07 ENCOUNTER — Other Ambulatory Visit: Payer: Self-pay

## 2020-12-07 ENCOUNTER — Telehealth: Payer: Self-pay

## 2020-12-07 DIAGNOSIS — J069 Acute upper respiratory infection, unspecified: Secondary | ICD-10-CM | POA: Diagnosis not present

## 2020-12-07 NOTE — Progress Notes (Signed)
Careteam: Patient Care Team: Lauree Chandler, NP as PCP - General (Nurse Practitioner)  Advanced Directive information    Allergies  Allergen Reactions   Ace Inhibitors     cough   Amoxicillin Itching    REACTION: unknown? pain in right kidney   Benicar Hct [Olmesartan Medoxomil-Hctz]     Extreme weakness   Budesonide-Formoterol Fumarate     Causes tremors and numbness   Chlorzoxazone Hives   Chlorzoxazone [Chlorzoxazone]    Citalopram Hydrobromide     REACTION: hives   Citalopram Hydrobromide    Clonidine Derivatives    Cymbalta [Duloxetine Hcl]    Droperidol     REACTION: hives   Flexeril [Cyclobenzaprine Hcl]    Fluconazole Nausea And Vomiting and Other (See Comments)    fatigue   Formoterol Itching   Gabapentin Itching   Ketorolac Tromethamine     REACTION: hives   Ketorolac Tromethamine    Metoclopramide Hcl    Mometasone Furo-Formoterol Fum     Causes sore throat, blurred vision and unable to sleep--started on med 09-17-10   Mometasone Furoate Itching   Morphine     REACTION: hives and itching   Olmesartan Medoxomil     REACTION: fatique   Breo Ellipta [Fluticasone Furoate-Vilanterol] Itching    Pt reports itching with Memory Dance is related to being lactose intolerant.     Chief Complaint  Patient presents with   Acute Visit    Patient states that she has been feeling bad since Sunday. Patient feeling very tired. She has been taking Mucinex DM. Patient having headache, coughing, sneezing and chills. Patient feeling "fuzzy headed" and losing balance and feeling weak. No fever. No known COVID exposure. No sore throat. Patient took at home COVID right before speaking with me and it is negative.     HPI: Patient is a 80 y.o. female feeling poorly.  She reports she has been feeling bad for 5 days. Normally gets better after a few days. COVID negative.  Does not feel well enough to get out.  Reports congestion in her sinuses mostly, making her balance off some  and feeling dizziness.  Forgot about her meclizine.  Worse in the morning but better in the afternoon. Overall feeling some better.   Reports she has more cold chills but this has been ongoing for weeks.   Mild cough, mild headaches.  No sore throat.  No wheezing.   Using saline throughout the day.  Props her head up at night to sleep.    Review of Systems:  Review of Systems  Constitutional:  Negative for chills, fever and weight loss.  HENT:  Positive for congestion. Negative for hearing loss, sore throat and tinnitus.   Respiratory:  Positive for cough. Negative for sputum production and shortness of breath.   Cardiovascular:  Negative for chest pain, palpitations and leg swelling.  Gastrointestinal:  Negative for abdominal pain, constipation, diarrhea and heartburn.  Genitourinary:  Negative for dysuria, frequency and urgency.  Skin: Negative.   Neurological:  Positive for dizziness and headaches (mild).  Psychiatric/Behavioral:  Negative for depression and memory loss. The patient does not have insomnia.    Past Medical History:  Diagnosis Date   ALLERGIC RHINITIS    Anxiety    Asthma    Blood type O+    Cervical strain, acute    Cirrhosis ([place])    Colon polyp 2010   adenoma   Diverticulosis of colon    Dizziness    Esophageal  varices (HCC)    Fibromyalgia    GERD (gastroesophageal reflux disease)    History of nephrolithiasis    Hypercholesterolemia    borderline   Hypertension    IBS (irritable bowel syndrome)    Osteoarthritis    Personal history of allergy to unspecified medicinal agent    Postconcussion syndrome    Vitamin D deficiency    Past Surgical History:  Procedure Laterality Date   CHOLECYSTECTOMY, LAPAROSCOPIC  2004   for gallstone pancreatitis   KNEE SURGERY Right    KNEE SURGERY Left    THYROID SURGERY     cyst removed   Social History:   reports that she quit smoking about 58 years ago. Her smoking use included cigarettes. She has a  16.00 pack-year smoking history. She has never used smokeless tobacco. She reports that she does not drink alcohol and does not use drugs.  Family History  Problem Relation Age of Onset   Breast cancer Mother    Alzheimer's disease Mother    COPD Brother    Alcohol abuse Brother    Heart disease Sister    Breast cancer Sister 45   Alcohol abuse Sister    Ovarian cancer Paternal Grandmother    Arthritis Other        Cousins    COPD Cousin        Maternal side    Heart attack Maternal Uncle    Colon cancer Neg Hx    Esophageal cancer Neg Hx     Medications: Patient's Medications  New Prescriptions   No medications on file  Previous Medications   ACETAMINOPHEN (TYLENOL) 500 MG TABLET    Take 1,000 mg by mouth 2 (two) times a day.   ADVAIR HFA 115-21 MCG/ACT INHALER    TAKE 2 PUFFS BY MOUTH TWICE A DAY   ALBUTEROL (PROAIR HFA) 108 (90 BASE) MCG/ACT INHALER    INHALE 2 PUFFS EVERY 4 HOURS AS NEEDED INTO THE LUNGS FOR WHEEZING OR SHORTNESS OF BREATH J45.20   AMLODIPINE (NORVASC) 5 MG TABLET    TAKE 1 TABLET BY MOUTH EVERY DAY   APIXABAN (ELIQUIS) 2.5 MG TABS TABLET    Take by mouth.   CARBAMIDE PEROXIDE (EAR WAX REMOVAL DROPS) 6.5 % OTIC SOLUTION    5 drops 2 (two) times daily as needed.   CETIRIZINE (ZYRTEC) 10 MG TABLET    Take 10 mg by mouth daily.   CHOLECALCIFEROL 50 MCG (2000 UT) CAPS    Take 2,000 Units by mouth daily.   ESOMEPRAZOLE (NEXIUM) 20 MG CAPSULE    Take 1 capsule (20 mg total) by mouth daily.   LOSARTAN (COZAAR) 25 MG TABLET    TAKE 1 TABLET BY MOUTH EVERY DAY   MECLIZINE (ANTIVERT) 25 MG TABLET    TAKE 1 TABLET BY MOUTH 3 TIMES A DAY AS NEEDED FOR DIZZINESS OR NAUSEA   OMEGA-3 FATTY ACIDS (FISH OIL MAXIMUM STRENGTH) 1200 MG CAPS    Take 1,200 mg by mouth 2 (two) times daily.    PROPRANOLOL (INDERAL) 10 MG TABLET    TAKE 1 TABLET BY MOUTH TWICE A DAY   PROPYLENE GLYCOL (SYSTANE BALANCE) 0.6 % SOLN    Place 2 drops into both eyes 2 (two) times daily.  Modified  Medications   No medications on file  Discontinued Medications   No medications on file    Physical Exam:  There were no vitals filed for this visit. There is no height or weight on file to  calculate BMI. Wt Readings from Last 3 Encounters:  11/06/20 161 lb 12.8 oz (73.4 kg)  08/07/20 150 lb (68 kg)  07/31/20 154 lb (69.9 kg)      Labs reviewed: Basic Metabolic Panel: Recent Labs    12/08/19 1154 05/08/20 1011 11/06/20 0943  NA 139 141 139  K 4.6 4.5 4.5  CL 103 106 103  CO2 29 28 26   GLUCOSE 98 93 101*  BUN 13 20 25   CREATININE 0.94* 0.64 0.68  CALCIUM 10.5* 9.4 9.9   Liver Function Tests: Recent Labs    12/08/19 1154 05/08/20 1011 11/06/20 0943  AST 23 20 25   ALT 17 12 15   BILITOT 0.8 0.4 0.8  PROT 6.8 6.5 7.1   No results for input(s): LIPASE, AMYLASE in the last 8760 hours. No results for input(s): AMMONIA in the last 8760 hours. CBC: Recent Labs    01/11/20 1124 05/08/20 1011 11/06/20 0943  WBC 8.1 6.1 6.8  NEUTROABS 3,596 1,879 2,156  HGB 13.6 13.0 14.1  HCT 40.7 39.0 42.7  MCV 90.6 87.4 88.8  PLT 385 273 334   Lipid Panel: Recent Labs    05/08/20 1011 11/06/20 0943  CHOL 198 233*  HDL 59 65  LDLCALC 118* 140*  TRIG 99 149  CHOLHDL 3.4 3.6   TSH: No results for input(s): TSH in the last 8760 hours. A1C: Lab Results  Component Value Date   HGBA1C 5.5 11/06/2020     Assessment/Plan 1. Viral upper respiratory tract infection -supportive care at this time.  -recommended to take Vit C 1000 mg daily, Vit D units daily, zinc 50 mg daily until symptoms improved -mucinex DM twice daily as needed for chest congestion and cough -neti pot or saline wash daily Plain nasal saline spray throughout the day as needed May use tylenol 325 mg 2 tablets every 6 hours as needed aches and pains or sore throat Keep well hydrated Avoid forcefully blowing nose -education provided on when to follow up and when to seek immediate medication attention  through the ED.   Carlos American. Harle Battiest  [facility] [phone]    Virtual Visit via telephone  I connected with patient on 12/07/20 at 10:00 AM EDT by telephone and verified that I am speaking with the correct person using two identifiers.  Location: Patient: home Provider: [place]   I discussed the limitations, risks, security and privacy concerns of performing an evaluation and management service by telephone and the availability of in person appointments. I also discussed with the patient that there may be a patient responsible charge related to this service. The patient expressed understanding and agreed to proceed.   I discussed the assessment and treatment plan with the patient. The patient was provided an opportunity to ask questions and all were answered. The patient agreed with the plan and demonstrated an understanding of the instructions.   The patient was advised to call back or seek an in-person evaluation if the symptoms worsen or if the condition fails to improve as anticipated.  I provided 18 minutes of non-face-to-face time during this encounter.  Carlos American. Harle Battiest Avs printed and mailed

## 2020-12-07 NOTE — Telephone Encounter (Signed)
[name], ramo are scheduled for a virtual visit with your provider today.    Just as we do with appointments in the office, we must obtain your consent to participate.  Your consent will be active for this visit and any virtual visit you may have with one of our providers in the next 365 days.    If you have a MyChart account, I can also send a copy of this consent to you electronically.  All virtual visits are billed to your insurance company just like a traditional visit in the office.  As this is a virtual visit, video technology does not allow for your provider to perform a traditional examination.  This may limit your provider's ability to fully assess your condition.  If your provider identifies any concerns that need to be evaluated in person or the need to arrange testing such as labs, EKG, etc, we will make arrangements to do so.    Although advances in technology are sophisticated, we cannot ensure that it will always work on either your end or our end.  If the connection with a video visit is poor, we may have to switch to a telephone visit.  With either a video or telephone visit, we are not always able to ensure that we have a secure connection.   I need to obtain your verbal consent now.   Are you willing to proceed with your visit today?   [name] has provided verbal consent on 12/07/2020 for a virtual visit (video or telephone).   [name], [facility] 12/07/2020  10:22 AM

## 2020-12-07 NOTE — Progress Notes (Signed)
This service is provided via telemedicine  No vital signs collected/recorded due to the encounter was a telemedicine visit.   Location of patient (ex: home, work):  Home  Patient consents to a telephone visit:  Yes, see encounter dated 12/07/2020  Location of the provider (ex: office, home):  [place]  Name of any referring provider:  N/A  Names of all persons participating in the telemedicine service and their role in the encounter:  Sherrie Mustache, Nurse Practitioner, [name], CMA, and patient.   Time spent on call:  11 minutes with medical assistant

## 2020-12-21 ENCOUNTER — Other Ambulatory Visit: Payer: Self-pay | Admitting: Gastroenterology

## 2021-01-01 ENCOUNTER — Ambulatory Visit (INDEPENDENT_AMBULATORY_CARE_PROVIDER_SITE_OTHER): Payer: PPO | Admitting: Family

## 2021-01-01 ENCOUNTER — Encounter: Payer: Self-pay | Admitting: Family

## 2021-01-01 ENCOUNTER — Other Ambulatory Visit: Payer: Self-pay

## 2021-01-01 VITALS — BP 150/90 | HR 63 | Temp 99.1°F

## 2021-01-01 DIAGNOSIS — J0101 Acute recurrent maxillary sinusitis: Secondary | ICD-10-CM

## 2021-01-01 DIAGNOSIS — R0989 Other specified symptoms and signs involving the circulatory and respiratory systems: Secondary | ICD-10-CM

## 2021-01-01 LAB — POCT INFLUENZA A/B
Influenza A, POC: NEGATIVE
Influenza B, POC: NEGATIVE

## 2021-01-01 MED ORDER — SACCHAROMYCES BOULARDII 250 MG PO CAPS: 250.0000 mg | ORAL_CAPSULE | Freq: Two times a day (BID) | ORAL | 0 refills | Status: AC

## 2021-01-01 MED ORDER — DOXYCYCLINE HYCLATE 100 MG PO TABS: 100.0000 mg | ORAL_TABLET | Freq: Two times a day (BID) | ORAL | 0 refills | Status: AC

## 2021-01-01 NOTE — Progress Notes (Signed)
Provider: Vanita Cannell FNP-C  Lauree Chandler, NP  Patient Care Team: Lauree Chandler, NP as PCP - General (Nurse Practitioner)  Extended Emergency Contact Information Primary Emergency Contact: Jerald Kief Address: [place] [address] Johnnette Litter of [place] Phone: [phone] Work Phone: [phone] Relation: Son Secondary Emergency Contact: MEDLEY,SUE          [place], [place] Mobile Phone: [phone] Relation: Relative  Code Status:  DNR Goals of care: Advanced Directive information Advanced Directives 01/01/2021  Does Patient Have a Medical Advance Directive? Yes  Type of Advance Directive Out of facility DNR (pink MOST or yellow form)  Does patient want to make changes to medical advance directive? No - Patient declined  Would patient like information on creating a medical advance directive? -  Pre-existing out of facility DNR order (yellow form or pink MOST form) -     Chief Complaint  Patient presents with   Acute Visit    Complains of sinus issues, headache, nasal drainage, ear pain, burning eyes with pressure.    HPI:  Pt is a 80 y.o. female seen today for an acute visit for evaluation of sinus congestion,pressure,nasal drainage,ear pain,burning eyes,hoarseness and mild cough x 4 weeks ago.Has used mucinex Dm,Tylenol  and Zinc 50 mg tablet.Also takes Zyrtec at night. Home COVID-19 test was negative. She denies any fever,chills,fatigue,body aches,,chest tightness,chest pain,palpitation or shortness of breath.    Past Medical History:  Diagnosis Date   ALLERGIC RHINITIS    Anxiety    Asthma    Blood type O+    Cervical strain, acute    Cirrhosis ([facility])    Colon polyp 2010   adenoma   Diverticulosis of colon    Dizziness    Esophageal varices ([facility])    Fibromyalgia    GERD (gastroesophageal reflux disease)    History of nephrolithiasis    Hypercholesterolemia     borderline   Hypertension    IBS (irritable bowel syndrome)    Osteoarthritis    Personal history of allergy to unspecified medicinal agent    Postconcussion syndrome    Vitamin D deficiency    Past Surgical History:  Procedure Laterality Date   CHOLECYSTECTOMY, LAPAROSCOPIC  2004   for gallstone pancreatitis   KNEE SURGERY Right    KNEE SURGERY Left    THYROID SURGERY     cyst removed    Allergies  Allergen Reactions   Ace Inhibitors     cough   Amoxicillin Itching    REACTION: unknown? pain in right kidney   Benicar Hct [Olmesartan Medoxomil-Hctz]     Extreme weakness   Budesonide-Formoterol Fumarate     Causes tremors and numbness   Chlorzoxazone Hives   Chlorzoxazone [Chlorzoxazone]    Citalopram Hydrobromide     REACTION: hives   Citalopram Hydrobromide    Clonidine Derivatives    Cymbalta [Duloxetine Hcl]    Droperidol     REACTION: hives   Flexeril [Cyclobenzaprine Hcl]    Fluconazole Nausea And Vomiting and Other (See Comments)    fatigue   Formoterol Itching   Gabapentin Itching   Ketorolac Tromethamine     REACTION: hives   Ketorolac Tromethamine    Metoclopramide Hcl    Mometasone Furo-Formoterol Fum     Causes sore throat, blurred vision and unable to sleep--started on med 09-17-10   Mometasone Furoate Itching   Morphine  REACTION: hives and itching   Olmesartan Medoxomil     REACTION: fatique   Breo Ellipta [Fluticasone Furoate-Vilanterol] Itching    Pt reports itching with Memory Dance is related to being lactose intolerant.     Outpatient Encounter Medications as of 01/01/2021  Medication Sig   acetaminophen (TYLENOL) 500 MG tablet Take 1,000 mg by mouth 2 (two) times a day.   ADVAIR HFA 115-21 MCG/ACT inhaler TAKE 2 PUFFS BY MOUTH TWICE A DAY   albuterol (PROAIR HFA) 108 (90 Base) MCG/ACT inhaler INHALE 2 PUFFS EVERY 4 HOURS AS NEEDED INTO THE LUNGS FOR WHEEZING OR SHORTNESS OF BREATH J45.20   amLODipine (NORVASC) 5 MG tablet TAKE 1 TABLET BY  MOUTH EVERY DAY   apixaban (ELIQUIS) 2.5 MG TABS tablet Take by mouth.   cetirizine (ZYRTEC) 10 MG tablet Take 10 mg by mouth daily.   Cholecalciferol 50 MCG (2000 UT) CAPS Take 2,000 Units by mouth daily.   esomeprazole (NEXIUM) 20 MG capsule Take 1 capsule (20 mg total) by mouth daily.   losartan (COZAAR) 25 MG tablet TAKE 1 TABLET BY MOUTH EVERY DAY   Omega-3 Fatty Acids (FISH OIL MAXIMUM STRENGTH) 1200 MG CAPS Take 1,200 mg by mouth 2 (two) times daily.    propranolol (INDERAL) 10 MG tablet TAKE 1 TABLET BY MOUTH TWICE A DAY   Propylene Glycol (SYSTANE BALANCE) 0.6 % SOLN Place 2 drops into both eyes 2 (two) times daily.   [DISCONTINUED] carbamide peroxide (EAR WAX REMOVAL DROPS) 6.5 % OTIC solution 5 drops 2 (two) times daily as needed. (Patient not taking: Reported on 12/07/2020)   [DISCONTINUED] meclizine (ANTIVERT) 25 MG tablet TAKE 1 TABLET BY MOUTH 3 TIMES A DAY AS NEEDED FOR DIZZINESS OR NAUSEA (Patient not taking: Reported on 12/07/2020)   No facility-administered encounter medications on file as of 01/01/2021.    Review of Systems  Constitutional:  Negative for appetite change, chills, fatigue and fever.  HENT:  Positive for congestion, postnasal drip, rhinorrhea, sinus pressure, sinus pain and sore throat. Negative for dental problem, ear discharge, ear pain, facial swelling, hearing loss, nosebleeds, sneezing, tinnitus and trouble swallowing.   Eyes:  Positive for pain. Negative for discharge, redness, itching and visual disturbance.  Respiratory:  Positive for cough. Negative for chest tightness, shortness of breath and wheezing.   Cardiovascular:  Negative for chest pain, palpitations and leg swelling.  Gastrointestinal:  Negative for abdominal distention, abdominal pain, constipation, diarrhea, nausea and vomiting.  Musculoskeletal:  Positive for gait problem. Negative for arthralgias, back pain, joint swelling, myalgias, neck pain and neck stiffness.  Skin:  Negative for color  change, pallor, rash and wound.  Neurological:  Positive for headaches. Negative for syncope, speech difficulty, weakness, light-headedness and numbness.       Chronic dizziness meclizine effective   Hematological:  Does not bruise/bleed easily.  Psychiatric/Behavioral:  Negative for agitation, behavioral problems, confusion, hallucinations and sleep disturbance. The patient is not nervous/anxious.    Immunization History  Administered Date(s) Administered   Hepatitis A, Adult 10/15/2017, 04/17/2018   Influenza Split 02/11/2011, 02/25/2012   Influenza Whole 02/10/2009, 02/26/2010   Influenza, High Dose Seasonal PF 04/24/2017, 01/26/2018, 01/26/2018, 01/03/2019, 02/11/2020   Influenza,inj,Quad PF,6+ Mos 02/09/2014, 03/20/2015, 02/27/2016   Influenza-Unspecified 03/13/2013, 03/13/2017   PFIZER Comirnaty(Gray Top)Covid-19 Tri-Sucrose Vaccine 10/17/2020   PFIZER(Purple Top)SARS-COV-2 Vaccination 06/03/2019, 06/22/2019, 02/09/2020   Pneumococcal Conjugate-13 02/27/2016   Pneumococcal Polysaccharide-23 08/25/2014   Tdap 12/06/2013   Zoster Recombinat (Shingrix) 01/13/2019, 08/05/2019   Pertinent  Health Maintenance  Due  Topic Date Due   INFLUENZA VACCINE  12/11/2020   COLONOSCOPY (Pts 45-58yr Insurance coverage will need to be confirmed)  08/07/2021 (Originally 06/25/2020)   DEXA SCAN  Completed   PNA vac Low Risk Adult  Completed   Fall Risk  01/01/2021 08/07/2020 07/07/2020 05/08/2020 11/04/2019  Falls in the past year? 0 0 0 0 0  Number falls in past yr: 0 0 0 0 0  Injury with Fall? 0 0 0 0 0  Risk for fall due to : No Fall Risks - - - -  Follow up Falls evaluation completed - - - -   Functional Status Survey:    Vitals:   01/01/21 1300  BP: (!) 150/90  Pulse: 63  Temp: 99.1 F (37.3 C)  SpO2: 96%   There is no height or weight on file to calculate BMI. Physical Exam  Labs reviewed: Recent Labs    05/08/20 1011 11/06/20 0943  NA 141 139  K 4.5 4.5  CL 106 103  CO2 28  26  GLUCOSE 93 101*  BUN 20 25  CREATININE 0.64 0.68  CALCIUM 9.4 9.9   Recent Labs    05/08/20 1011 11/06/20 0943  AST 20 25  ALT 12 15  BILITOT 0.4 0.8  PROT 6.5 7.1   Recent Labs    01/11/20 1124 05/08/20 1011 11/06/20 0943  WBC 8.1 6.1 6.8  NEUTROABS 3,596 1,879 2,156  HGB 13.6 13.0 14.1  HCT 40.7 39.0 42.7  MCV 90.6 87.4 88.8  PLT 385 273 334   Lab Results  Component Value Date   TSH 1.666 11/22/2019   Lab Results  Component Value Date   HGBA1C 5.5 11/06/2020   Lab Results  Component Value Date   CHOL 233 (H) 11/06/2020   HDL 65 11/06/2020   LDLCALC 140 (H) 11/06/2020   LDLDIRECT 102.5 07/02/2012   TRIG 149 11/06/2020   CHOLHDL 3.6 11/06/2020    Significant Diagnostic Results in last 30 days:  No results found.  Assessment/Plan  1. Upper respiratory symptom Temp 99.1  Bilateral lung CTA  - continue on tylenol Vitamin C and zyrtec  - SARS-COV-2 RNA,(COVID-19) QUAL NAAT - POCT Influenza A/B results were negative.   2. Acute recurrent maxillary sinusitis Temp 99.1  Symptoms worsening.Frontal and maxillary tenderness on percussion.  Start on doxycycline as below side effects discussed. Advised to take along with Florastor as below or Yogurt - doxycycline (VIBRA-TABS) 100 MG tablet; Take 1 tablet (100 mg total) by mouth 2 (two) times daily for 10 days.  Dispense: 20 tablet; Refill: 0 - saccharomyces boulardii (FLORASTOR) 250 MG capsule; Take 1 capsule (250 mg total) by mouth 2 (two) times daily for 10 days.  Dispense: 20 capsule; Refill: 0 - increase fluid intake   Family/ staff Communication: Reviewed plan of care with patient verbalized understanding.  Labs/tests ordered:  - SARS-COV-2 RNA,(COVID-19) QUAL NAAT - POCT Influenza A/B  Next Appointment: As needed if symptoms worsen or fail to improve    DSandrea Hughs NP

## 2021-01-01 NOTE — Patient Instructions (Signed)
-   continue on vitamin C - continue on tylenol as needed for pain or fever

## 2021-01-02 LAB — SARS-COV-2 RNA,(COVID-19) QUALITATIVE NAAT: SARS CoV2 RNA: NOT DETECTED

## 2021-01-16 ENCOUNTER — Encounter: Payer: Self-pay | Admitting: Nurse Practitioner

## 2021-01-16 ENCOUNTER — Other Ambulatory Visit: Payer: Self-pay

## 2021-01-16 ENCOUNTER — Ambulatory Visit (INDEPENDENT_AMBULATORY_CARE_PROVIDER_SITE_OTHER): Payer: PPO | Admitting: Nurse Practitioner

## 2021-01-16 DIAGNOSIS — Z0000 Encounter for general adult medical examination without abnormal findings: Secondary | ICD-10-CM

## 2021-01-16 NOTE — Patient Instructions (Signed)
Ms. [name] , Thank you for taking time to come for your Medicare Wellness Visit. I appreciate your ongoing commitment to your health goals. Please review the following plan we discussed and let me know if I can assist you in the future.   Screening recommendations/referrals: Colonoscopy aged out Mammogram aged out Bone Density up to date Recommended yearly ophthalmology/optometry visit for glaucoma screening and checkup Recommended yearly dental visit for hygiene and checkup  Vaccinations: Influenza vaccine Recommended to get at this time.  Pneumococcal vaccine up to date Tdap vaccine up to date Shingles vaccine up to date     Advanced directives: recommended to complete and bring to office to place on file.   Conditions/risks identified: obesity, advance age  Next appointment: 1 year for AWV   Preventive Care 57 Years and Older, Female Preventive care refers to lifestyle choices and visits with your health care provider that can promote health and wellness. What does preventive care include? A yearly physical exam. This is also called an annual well check. Dental exams once or twice a year. Routine eye exams. Ask your health care provider how often you should have your eyes checked. Personal lifestyle choices, including: Daily care of your teeth and gums. Regular physical activity. Eating a healthy diet. Avoiding tobacco and drug use. Limiting alcohol use. Practicing safe sex. Taking low-dose aspirin every day. Taking vitamin and mineral supplements as recommended by your health care provider. What happens during an annual well check? The services and screenings done by your health care provider during your annual well check will depend on your age, overall health, lifestyle risk factors, and family history of disease. Counseling  Your health care provider may ask you questions about your: Alcohol use. Tobacco use. Drug use. Emotional well-being. Home and relationship  well-being. Sexual activity. Eating habits. History of falls. Memory and ability to understand (cognition). Work and work Statistician. Reproductive health. Screening  You may have the following tests or measurements: Height, weight, and BMI. Blood pressure. Lipid and cholesterol levels. These may be checked every 5 years, or more frequently if you are over 81 years old. Skin check. Lung cancer screening. You may have this screening every year starting at age 43 if you have a 30-pack-year history of smoking and currently smoke or have quit within the past 15 years. Fecal occult blood test (FOBT) of the stool. You may have this test every year starting at age 22. Flexible sigmoidoscopy or colonoscopy. You may have a sigmoidoscopy every 5 years or a colonoscopy every 10 years starting at age 52. Hepatitis C blood test. Hepatitis B blood test. Sexually transmitted disease (STD) testing. Diabetes screening. This is done by checking your blood sugar (glucose) after you have not eaten for a while (fasting). You may have this done every 1-3 years. Bone density scan. This is done to screen for osteoporosis. You may have this done starting at age 20. Mammogram. This may be done every 1-2 years. Talk to your health care provider about how often you should have regular mammograms. Talk with your health care provider about your test results, treatment options, and if necessary, the need for more tests. Vaccines  Your health care provider may recommend certain vaccines, such as: Influenza vaccine. This is recommended every year. Tetanus, diphtheria, and acellular pertussis (Tdap, Td) vaccine. You may need a Td booster every 10 years. Zoster vaccine. You may need this after age 13. Pneumococcal 13-valent conjugate (PCV13) vaccine. One dose is recommended after age 18. Pneumococcal  polysaccharide (PPSV23) vaccine. One dose is recommended after age 16. Talk to your health care provider about which  screenings and vaccines you need and how often you need them. This information is not intended to replace advice given to you by your health care provider. Make sure you discuss any questions you have with your health care provider. Document Released: 05/26/2015 Document Revised: 01/17/2016 Document Reviewed: 02/28/2015 Elsevier Interactive Patient Education  2017 [employer] Prevention in the Home Falls can cause injuries. They can happen to people of all ages. There are many things you can do to make your home safe and to help prevent falls. What can I do on the outside of my home? Regularly fix the edges of walkways and driveways and fix any cracks. Remove anything that might make you trip as you walk through a door, such as a raised step or threshold. Trim any bushes or trees on the path to your home. Use bright outdoor lighting. Clear any walking paths of anything that might make someone trip, such as rocks or tools. Regularly check to see if handrails are loose or broken. Make sure that both sides of any steps have handrails. Any raised decks and porches should have guardrails on the edges. Have any leaves, snow, or ice cleared regularly. Use sand or salt on walking paths during winter. Clean up any spills in your garage right away. This includes oil or grease spills. What can I do in the bathroom? Use night lights. Install grab bars by the toilet and in the tub and shower. Do not use towel bars as grab bars. Use non-skid mats or decals in the tub or shower. If you need to sit down in the shower, use a plastic, non-slip stool. Keep the floor dry. Clean up any water that spills on the floor as soon as it happens. Remove soap buildup in the tub or shower regularly. Attach bath mats securely with double-sided non-slip rug tape. Do not have throw rugs and other things on the floor that can make you trip. What can I do in the bedroom? Use night lights. Make sure that you have a  light by your bed that is easy to reach. Do not use any sheets or blankets that are too big for your bed. They should not hang down onto the floor. Have a firm chair that has side arms. You can use this for support while you get dressed. Do not have throw rugs and other things on the floor that can make you trip. What can I do in the kitchen? Clean up any spills right away. Avoid walking on wet floors. Keep items that you use a lot in easy-to-reach places. If you need to reach something above you, use a strong step stool that has a grab bar. Keep electrical cords out of the way. Do not use floor polish or wax that makes floors slippery. If you must use wax, use non-skid floor wax. Do not have throw rugs and other things on the floor that can make you trip. What can I do with my stairs? Do not leave any items on the stairs. Make sure that there are handrails on both sides of the stairs and use them. Fix handrails that are broken or loose. Make sure that handrails are as long as the stairways. Check any carpeting to make sure that it is firmly attached to the stairs. Fix any carpet that is loose or worn. Avoid having throw rugs at the top  or bottom of the stairs. If you do have throw rugs, attach them to the floor with carpet tape. Make sure that you have a light switch at the top of the stairs and the bottom of the stairs. If you do not have them, ask someone to add them for you. What else can I do to help prevent falls? Wear shoes that: Do not have high heels. Have rubber bottoms. Are comfortable and fit you well. Are closed at the toe. Do not wear sandals. If you use a stepladder: Make sure that it is fully opened. Do not climb a closed stepladder. Make sure that both sides of the stepladder are locked into place. Ask someone to hold it for you, if possible. Clearly mark and make sure that you can see: Any grab bars or handrails. First and last steps. Where the edge of each step  is. Use tools that help you move around (mobility aids) if they are needed. These include: Canes. Walkers. Scooters. Crutches. Turn on the lights when you go into a dark area. Replace any light bulbs as soon as they burn out. Set up your furniture so you have a clear path. Avoid moving your furniture around. If any of your floors are uneven, fix them. If there are any pets around you, be aware of where they are. Review your medicines with your doctor. Some medicines can make you feel dizzy. This can increase your chance of falling. Ask your doctor what other things that you can do to help prevent falls. This information is not intended to replace advice given to you by your health care provider. Make sure you discuss any questions you have with your health care provider. Document Released: 02/23/2009 Document Revised: 10/05/2015 Document Reviewed: 06/03/2014 Elsevier Interactive Patient Education  2017 [employer].

## 2021-01-16 NOTE — Progress Notes (Signed)
This service is provided via telemedicine  No vital signs collected/recorded due to the encounter was a telemedicine visit.   Location of patient (ex: home, work):  Home  Patient consents to a telephone visit:  Yes, see encounter dated 12/07/2020  Location of the provider (ex: office, home):  [place]  Name of any referring provider:  N/A  Names of all persons participating in the telemedicine service and their role in the encounter:  Sherrie Mustache, Nurse Practitioner, [name], CMA, and patient.   Time spent on call:  10 minutes with medical assistant

## 2021-01-16 NOTE — Progress Notes (Signed)
Subjective:   [name] is a 80 y.o. female who presents for Medicare Annual (Subsequent) preventive examination.  Review of Systems     Cardiac Risk Factors include: advanced age (>50mn, >>60women);obesity (BMI >30kg/m2);sedentary lifestyle;family history of premature cardiovascular disease;hypertension;dyslipidemia     Objective:    Today's Vitals   01/16/21 0953  PainSc: 8    There is no height or weight on file to calculate BMI.  Advanced Directives 01/16/2021 01/01/2021 11/06/2020 08/07/2020 07/07/2020 05/08/2020 11/20/2019  Does Patient Have a Medical Advance Directive? Yes Yes - Yes Yes Yes Yes  Type of Advance Directive Out of facility DNR (pink MOST or yellow form) Out of facility DNR (pink MOST or yellow form) - Out of facility DNR (pink MOST or yellow form) Out of facility DNR (pink MOST or yellow form) Out of facility DNR (pink MOST or yellow form) -  Does patient want to make changes to medical advance directive? No - Patient declined No - Patient declined No - Patient declined No - Patient declined No - Patient declined No - Patient declined No - Patient declined  Would patient like information on creating a medical advance directive? - - - - - - -  Pre-existing out of facility DNR order (yellow form or pink MOST form) Yellow form placed in chart (order not valid for inpatient use);Pink MOST form placed in chart (order not valid for inpatient use) - - Yellow form placed in chart (order not valid for inpatient use);Pink MOST form placed in chart (order not valid for inpatient use) Yellow form placed in chart (order not valid for inpatient use) Yellow form placed in chart (order not valid for inpatient use);Pink MOST form placed in chart (order not valid for inpatient use) -    Current Medications (verified) Outpatient Encounter Medications as of 01/16/2021  Medication Sig   acetaminophen (TYLENOL) 500 MG tablet Take 1,000 mg by mouth 2 (two) times a day.   ADVAIR HFA  115-21 MCG/ACT inhaler TAKE 2 PUFFS BY MOUTH TWICE A DAY   albuterol (PROAIR HFA) 108 (90 Base) MCG/ACT inhaler INHALE 2 PUFFS EVERY 4 HOURS AS NEEDED INTO THE LUNGS FOR WHEEZING OR SHORTNESS OF BREATH J45.20   amLODipine (NORVASC) 5 MG tablet TAKE 1 TABLET BY MOUTH EVERY DAY   apixaban (ELIQUIS) 2.5 MG TABS tablet Take by mouth.   cetirizine (ZYRTEC) 10 MG tablet Take 10 mg by mouth daily.   Cholecalciferol 50 MCG (2000 UT) CAPS Take 2,000 Units by mouth daily.   esomeprazole (NEXIUM) 20 MG capsule Take 1 capsule (20 mg total) by mouth daily.   losartan (COZAAR) 25 MG tablet TAKE 1 TABLET BY MOUTH EVERY DAY   Omega-3 Fatty Acids (FISH OIL MAXIMUM STRENGTH) 1200 MG CAPS Take 1,200 mg by mouth 2 (two) times daily.    propranolol (INDERAL) 10 MG tablet TAKE 1 TABLET BY MOUTH TWICE A DAY   Propylene Glycol (SYSTANE BALANCE) 0.6 % SOLN Place 2 drops into both eyes 2 (two) times daily.   No facility-administered encounter medications on file as of 01/16/2021.    Allergies (verified) Ace inhibitors, Amoxicillin, Benicar hct [olmesartan medoxomil-hctz], Budesonide-formoterol fumarate, Chlorzoxazone, Chlorzoxazone [chlorzoxazone], Citalopram hydrobromide, Citalopram hydrobromide, Clonidine derivatives, Cymbalta [duloxetine hcl], Droperidol, Flexeril [cyclobenzaprine hcl], Fluconazole, Formoterol, Gabapentin, Ketorolac tromethamine, Ketorolac tromethamine, Metoclopramide hcl, Mometasone furo-formoterol fum, Mometasone furoate, Morphine, Olmesartan medoxomil, and Breo ellipta [fluticasone furoate-vilanterol]   History: Past Medical History:  Diagnosis Date   ALLERGIC RHINITIS    Anxiety  Asthma    Blood type O+    Cervical strain, acute    Cirrhosis ([place])    Colon polyp 2010   adenoma   Diverticulosis of colon    Dizziness    Esophageal varices ([facility])    Fibromyalgia    GERD (gastroesophageal reflux disease)    History of nephrolithiasis    Hypercholesterolemia    borderline   Hypertension     IBS (irritable bowel syndrome)    Osteoarthritis    Personal history of allergy to unspecified medicinal agent    Postconcussion syndrome    Vitamin D deficiency    Past Surgical History:  Procedure Laterality Date   CHOLECYSTECTOMY, LAPAROSCOPIC  2004   for gallstone pancreatitis   KNEE SURGERY Right    KNEE SURGERY Left    THYROID SURGERY     cyst removed   Family History  Problem Relation Age of Onset   Breast cancer Mother    Alzheimer's disease Mother    COPD Brother    Alcohol abuse Brother    Heart disease Sister    Breast cancer Sister 39   Alcohol abuse Sister    Ovarian cancer Paternal Grandmother    Arthritis Other        Cousins    COPD Cousin        Maternal side    Heart attack Maternal Uncle    Colon cancer Neg Hx    Esophageal cancer Neg Hx    Social History   Socioeconomic History   Marital status: Divorced    Spouse name: Not on file   Number of children: 1   Years of education: Not on file   Highest education level: Not on file  Occupational History   Occupation: retired    Fish farm manager: [place]: [employer]  Tobacco Use   Smoking status: Former    Packs/day: 2.00    Years: 8.00    Pack years: 16.00    Types: Cigarettes    Quit date: 05/13/1962    Years since quitting: 58.7   Smokeless tobacco: Never  Vaping Use   Vaping Use: Never used  Substance and Sexual Activity   Alcohol use: No   Drug use: No   Sexual activity: Not Currently    Birth control/protection: Post-menopausal  Other Topics Concern   Not on file  Social History Narrative   exercises 3x a week   drinks 1 cup caffeine every other day   Social Determinants of Health   Financial Resource Strain: Not on file  Food Insecurity: Not on file  Transportation Needs: Not on file  Physical Activity: Not on file  Stress: Not on file  Social Connections: Not on file    Tobacco Counseling Counseling given: Not  Answered   Clinical Intake:  Pre-visit preparation completed: Yes  Pain : 0-10 Pain Score: 8  Pain Type: Chronic pain Pain Location: Back Pain Orientation: Lower Pain Radiating Towards: low back pain with bilateral scatica, L<R Pain Descriptors / Indicators: Numbness, Aching, Tingling Pain Onset: More than a month ago Pain Frequency: Constant Pain Relieving Factors: heating pad helps pain. Effect of Pain on Daily Activities: limits mobility  Pain Relieving Factors: heating pad helps pain.  BMI - recorded: 30 Nutritional Risks: None Diabetes: No  How often do you need to have someone help you when you read instructions, pamphlets, or other written materials from your doctor or pharmacy?: 1 - Never  Diabetic?no  Activities of Daily Living In your present state of health, do you have any difficulty performing the following activities: 01/16/2021  Hearing? Y  Vision? N  Difficulty concentrating or making decisions? Y  Walking or climbing stairs? Y  Dressing or bathing? N  Doing errands, shopping? N  Preparing Food and eating ? N  Using the Toilet? N  In the past six months, have you accidently leaked urine? N  Do you have problems with loss of bowel control? N  Managing your Medications? N  Managing your Finances? N  Housekeeping or managing your Housekeeping? N  Some recent data might be hidden    Patient Care Team: Lauree Chandler, NP as PCP - General (Nurse Practitioner)  Indicate any recent Medical Services you may have received from other than [facility] providers in the past year (date may be approximate).     Assessment:   This is a routine wellness examination for [name].  Hearing/Vision screen Hearing Screening - Comments:: Patient has some hearing trouble Vision Screening - Comments:: Patient wear reading glasses. Patient had recent eye exam. Patient sees Dr. Gershon Crane  Dietary issues and exercise activities discussed: Current Exercise Habits:  The patient does not participate in regular exercise at present   Goals Addressed   None    Depression Screen PHQ 2/9 Scores 01/16/2021 08/07/2020 10/01/2019 02/17/2019 10/15/2018 09/24/2018 06/18/2018  PHQ - 2 Score 0 0 0 0 0 0 0    Fall Risk Fall Risk  01/16/2021 01/01/2021 08/07/2020 07/07/2020 05/08/2020  Falls in the past year? 0 0 0 0 0  Number falls in past yr: 0 0 0 0 0  Injury with Fall? 0 0 0 0 0  Risk for fall due to : No Fall Risks No Fall Risks - - -  Follow up Falls evaluation completed Falls evaluation completed - - -    FALL RISK PREVENTION PERTAINING TO THE HOME:  Any stairs in or around the home? Yes  If so, are there any without handrails? No  Home free of loose throw rugs in walkways, pet beds, electrical cords, etc? Yes  Adequate lighting in your home to reduce risk of falls? Yes   ASSISTIVE DEVICES UTILIZED TO PREVENT FALLS:  Life alert? No  Use of a cane, walker or w/c? Yes  Grab bars in the bathroom? Yes  Shower chair or bench in shower? No  Elevated toilet seat or a handicapped toilet? No   TIMED UP AND GO:  Was the test performed? No .    Cognitive Function: MMSE - Mini Mental State Exam 07/16/2017 07/02/2016 04/04/2015 09/22/2013  Not completed: - - (No Data) -  Orientation to time 5 5 5 5   Orientation to Place 5 4 5 5   Registration 3 3 3 3   Attention/ Calculation 5 5 5 5   Recall 2 3 2 2   Language- name 2 objects 2 2 2 2   Language- repeat 1 1 1 1   Language- follow 3 step command 3 3 3 3   Language- read & follow direction 1 1 1 1   Write a sentence 1 1 1 1   Copy design 1 1 1 1   Total score 29 29 29 29      6CIT Screen 01/16/2021 10/01/2019 09/24/2018  What Year? 0 points 0 points -  What month? 0 points 0 points -  What time? 3 points 0 points 0 points  Count back from 20 2 points 0 points 0 points  Months in reverse 0 points 0 points  2 points  Repeat phrase 8 points 0 points 0 points  Total Score 13 0 -    Immunizations Immunization History   Administered Date(s) Administered   Hepatitis A, Adult 10/15/2017, 04/17/2018   Influenza Split 02/11/2011, 02/25/2012   Influenza Whole 02/10/2009, 02/26/2010   Influenza, High Dose Seasonal PF 04/24/2017, 01/26/2018, 01/26/2018, 01/03/2019, 02/11/2020   Influenza,inj,Quad PF,6+ Mos 02/09/2014, 03/20/2015, 02/27/2016   Influenza-Unspecified 03/13/2013, 03/13/2017   PFIZER Comirnaty(Gray Top)Covid-19 Tri-Sucrose Vaccine 10/17/2020   PFIZER(Purple Top)SARS-COV-2 Vaccination 06/03/2019, 06/22/2019, 02/09/2020   Pneumococcal Conjugate-13 02/27/2016   Pneumococcal Polysaccharide-23 08/25/2014   Tdap 12/06/2013   Zoster Recombinat (Shingrix) 01/13/2019, 08/05/2019    TDAP status: Up to date  Flu Vaccine status: Due, Education has been provided regarding the importance of this vaccine. Advised may receive this vaccine at local pharmacy or Health Dept. Aware to provide a copy of the vaccination record if obtained from local pharmacy or Health Dept. Verbalized acceptance and understanding.  Pneumococcal vaccine status: Up to date  Covid-19 vaccine status: Completed vaccines  Qualifies for Shingles Vaccine? Yes   Zostavax completed Yes   Shingrix Completed?: Yes  Screening Tests Health Maintenance  Topic Date Due   INFLUENZA VACCINE  12/11/2020   COLONOSCOPY (Pts 45-10yr Insurance coverage will need to be confirmed)  08/07/2021 (Originally 06/25/2020)   TETANUS/TDAP  12/07/2023   DEXA SCAN  Completed   COVID-19 Vaccine  Completed   PNA vac Low Risk Adult  Completed   Zoster Vaccines- Shingrix  Completed   HPV VACCINES  Aged Out    Health Maintenance  Health Maintenance Due  Topic Date Due   INFLUENZA VACCINE  12/11/2020    Colorectal cancer screening: No longer required.   Mammogram status: No longer required due to age.  Bone Density status: Completed 12/21/2019. Results reflect: Bone density results: OSTEOPENIA. Repeat every 2 years.  Lung Cancer Screening: (Low Dose CT  Chest recommended if Age 82-80years, 30 pack-year currently smoking OR have quit w/in 15years.) does not qualify.   Lung Cancer Screening Referral: na  Additional Screening:  Hepatitis C Screening: does not qualify;  Vision Screening: Recommended annual ophthalmology exams for early detection of glaucoma and other disorders of the eye. Is the patient up to date with their annual eye exam?  Yes  Who is the provider or what is the name of the office in which the patient attends annual eye exams? [facility] If pt is not established with a provider, would they like to be referred to a provider to establish care? No .   Dental Screening: Recommended annual dental exams for proper oral hygiene  Community Resource Referral / Chronic Care Management: CRR required this visit?  No   CCM required this visit?  No      Plan:     I have personally reviewed and noted the following in the patient's chart:   Medical and social history Use of alcohol, tobacco or illicit drugs  Current medications and supplements including opioid prescriptions.  Functional ability and status Nutritional status Physical activity Advanced directives List of other physicians Hospitalizations, surgeries, and ER visits in previous 12 months Vitals Screenings to include cognitive, depression, and falls Referrals and appointments  In addition, I have reviewed and discussed with patient certain preventive protocols, quality metrics, and best practice recommendations. A written personalized care plan for preventive services as well as general preventive health recommendations were provided to patient.     JLauree Chandler NP   01/16/2021  Virtual Visit via Telephone Note  I connected withNAME@ on 01/16/21 at  9:30 AM EDT by telephone and verified that I am speaking with the correct person using two identifiers.  Location: Patient: home Provider: [facility]   I discussed the limitations, risks, security and  privacy concerns of performing an evaluation and management service by telephone and the availability of in person appointments. I also discussed with the patient that there may be a patient responsible charge related to this service. The patient expressed understanding and agreed to proceed.   I discussed the assessment and treatment plan with the patient. The patient was provided an opportunity to ask questions and all were answered. The patient agreed with the plan and demonstrated an understanding of the instructions.   The patient was advised to call back or seek an in-person evaluation if the symptoms worsen or if the condition fails to improve as anticipated.  I provided 18 minutes of non-face-to-face time during this encounter.  Carlos American. Harle Battiest Avs printed and mailed

## 2021-01-23 ENCOUNTER — Other Ambulatory Visit: Payer: Self-pay | Admitting: Gastroenterology

## 2021-01-23 DIAGNOSIS — K219 Gastro-esophageal reflux disease without esophagitis: Secondary | ICD-10-CM

## 2021-01-26 ENCOUNTER — Other Ambulatory Visit: Payer: Self-pay | Admitting: Nurse Practitioner

## 2021-01-30 ENCOUNTER — Other Ambulatory Visit: Payer: Self-pay | Admitting: *Deleted

## 2021-01-30 NOTE — Telephone Encounter (Signed)
Patient called and wanted to confirm that you still wanted her to take Eliquis. Stated pharmacy would not refill her Rx.  Medication is in current medication list.   Pended Rx and sent to [facility] for approval.

## 2021-01-31 ENCOUNTER — Other Ambulatory Visit: Payer: PPO

## 2021-01-31 MED ORDER — APIXABAN 2.5 MG PO TABS: ORAL_TABLET | ORAL | 1 refills | Status: DC

## 2021-01-31 NOTE — Telephone Encounter (Signed)
Patient notified

## 2021-01-31 NOTE — Telephone Encounter (Signed)
Yes needs to continue medication

## 2021-02-05 ENCOUNTER — Ambulatory Visit: Payer: PPO | Admitting: Nurse Practitioner

## 2021-02-05 ENCOUNTER — Other Ambulatory Visit: Payer: PPO

## 2021-02-05 ENCOUNTER — Ambulatory Visit: Payer: PPO | Admitting: Podiatry

## 2021-02-05 ENCOUNTER — Other Ambulatory Visit: Payer: Self-pay

## 2021-02-05 DIAGNOSIS — E782 Mixed hyperlipidemia: Secondary | ICD-10-CM

## 2021-02-05 LAB — LIPID PANEL
Cholesterol: 254 mg/dL — ABNORMAL HIGH (ref <200)
HDL: 63 mg/dL (ref >=50)
LDL Cholesterol (Calc): 164 mg/dL (calc) — ABNORMAL HIGH
Non-HDL Cholesterol (Calc): 191 mg/dL (calc) — ABNORMAL HIGH (ref <130)
Total CHOL/HDL Ratio: 4 (calc) (ref <5.0)
Triglycerides: 136 mg/dL (ref <150)

## 2021-02-07 ENCOUNTER — Ambulatory Visit: Payer: PPO | Admitting: Nurse Practitioner

## 2021-02-08 ENCOUNTER — Ambulatory Visit (INDEPENDENT_AMBULATORY_CARE_PROVIDER_SITE_OTHER): Payer: PPO | Admitting: Nurse Practitioner

## 2021-02-08 ENCOUNTER — Encounter: Payer: Self-pay | Admitting: Nurse Practitioner

## 2021-02-08 ENCOUNTER — Other Ambulatory Visit: Payer: Self-pay

## 2021-02-08 VITALS — BP 160/70 | HR 74 | Temp 97.7°F | Ht 61.0 in | Wt 167.4 lb

## 2021-02-08 DIAGNOSIS — E782 Mixed hyperlipidemia: Secondary | ICD-10-CM

## 2021-02-08 DIAGNOSIS — I480 Paroxysmal atrial fibrillation: Secondary | ICD-10-CM

## 2021-02-08 DIAGNOSIS — R739 Hyperglycemia, unspecified: Secondary | ICD-10-CM | POA: Diagnosis not present

## 2021-02-08 DIAGNOSIS — K7460 Unspecified cirrhosis of liver: Secondary | ICD-10-CM | POA: Diagnosis not present

## 2021-02-08 DIAGNOSIS — I10 Essential (primary) hypertension: Secondary | ICD-10-CM

## 2021-02-08 DIAGNOSIS — M545 Low back pain, unspecified: Secondary | ICD-10-CM | POA: Diagnosis not present

## 2021-02-08 DIAGNOSIS — J309 Allergic rhinitis, unspecified: Secondary | ICD-10-CM

## 2021-02-08 DIAGNOSIS — J4520 Mild intermittent asthma, uncomplicated: Secondary | ICD-10-CM

## 2021-02-08 DIAGNOSIS — G8929 Other chronic pain: Secondary | ICD-10-CM

## 2021-02-08 NOTE — Progress Notes (Signed)
Careteam: Patient Care Team: Lauree Chandler, NP as PCP - General (Nurse Practitioner)  PLACE OF SERVICE:  [place] Directive information Does Patient Have a Medical Advance Directive?: Yes, Type of Advance Directive: Out of facility DNR (pink MOST or yellow form), Pre-existing out of facility DNR order (yellow form or pink MOST form): Yellow form placed in chart (order not valid for inpatient use);Pink MOST form placed in chart (order not valid for inpatient use), Does patient want to make changes to medical advance directive?: No - Patient declined  Allergies  Allergen Reactions   Ace Inhibitors     cough   Amoxicillin Itching    REACTION: unknown? pain in right kidney   Benicar Hct [Olmesartan Medoxomil-Hctz]     Extreme weakness   Budesonide-Formoterol Fumarate     Causes tremors and numbness   Chlorzoxazone Hives   Chlorzoxazone [Chlorzoxazone]    Citalopram Hydrobromide     REACTION: hives   Citalopram Hydrobromide    Clonidine Derivatives    Cymbalta [Duloxetine Hcl]    Droperidol     REACTION: hives   Flexeril [Cyclobenzaprine Hcl]    Fluconazole Nausea And Vomiting and Other (See Comments)    fatigue   Formoterol Itching   Gabapentin Itching   Ketorolac Tromethamine     REACTION: hives   Ketorolac Tromethamine    Metoclopramide Hcl    Mometasone Furo-Formoterol Fum     Causes sore throat, blurred vision and unable to sleep--started on med 09-17-10   Mometasone Furoate Itching   Morphine     REACTION: hives and itching   Olmesartan Medoxomil     REACTION: fatique   Breo Ellipta [Fluticasone Furoate-Vilanterol] Itching    Pt reports itching with Memory Dance is related to being lactose intolerant.     Chief Complaint  Patient presents with   Medical Management of Chronic Issues    3 month follow up visit. Patient states that pain from midline of back down to tailbone has gotten bad. She used the tramadol, but it doesn't seem to be helping.    Health Maintenance    Flu vaccine     HPI: Patient is a 80 y.o. female for routine follow up.   Blood pressure is elevated today- reports she is worried about her family in [place].   Pain has been getting worse- she was prescribed tramadol but it is not helping. She was scheduled for injection but cancelled because she was worried about reaction.  Unable to tolerate cymbalta, lyrica or gabapentin in the past. Taking tylenol and tramadol 8 or 9 taking medication. Now using walker which helps her mobility.  Ultimately does not feel like tramadol is helping.   She is not able to walk or exercise like she has been. Was walking for 45 minutes.   LDL worse at 164. She felt so bad and her diet got worse. She does not want to   Review of Systems:  Review of Systems  Constitutional:  Negative for chills, fever and weight loss.  HENT:  Positive for congestion. Negative for tinnitus.   Respiratory:  Negative for cough, sputum production and shortness of breath.   Cardiovascular:  Negative for chest pain, palpitations and leg swelling.  Gastrointestinal:  Negative for abdominal pain, constipation, diarrhea and heartburn.  Genitourinary:  Negative for dysuria, frequency and urgency.  Musculoskeletal:  Positive for back pain. Negative for falls, joint pain and myalgias.  Skin: Negative.   Neurological:  Negative for dizziness and  headaches.  Psychiatric/Behavioral:  Negative for depression and memory loss. The patient does not have insomnia.    Past Medical History:  Diagnosis Date   ALLERGIC RHINITIS    Anxiety    Asthma    Blood type O+    Cervical strain, acute    Cirrhosis ([place])    Colon polyp 2010   adenoma   Diverticulosis of colon    Dizziness    Esophageal varices (HCC)    Fibromyalgia    GERD (gastroesophageal reflux disease)    History of nephrolithiasis    Hypercholesterolemia    borderline   Hypertension    IBS (irritable bowel syndrome)    Osteoarthritis    Personal  history of allergy to unspecified medicinal agent    Postconcussion syndrome    Vitamin D deficiency    Past Surgical History:  Procedure Laterality Date   CHOLECYSTECTOMY, LAPAROSCOPIC  2004   for gallstone pancreatitis   KNEE SURGERY Right    KNEE SURGERY Left    THYROID SURGERY     cyst removed   Social History:   reports that she quit smoking about 58 years ago. Her smoking use included cigarettes. She has a 16.00 pack-year smoking history. She has never used smokeless tobacco. She reports that she does not drink alcohol and does not use drugs.  Family History  Problem Relation Age of Onset   Breast cancer Mother    Alzheimer's disease Mother    COPD Brother    Alcohol abuse Brother    Heart disease Sister    Breast cancer Sister 60   Alcohol abuse Sister    Ovarian cancer Paternal Grandmother    Arthritis Other        Cousins    COPD Cousin        Maternal side    Heart attack Maternal Uncle    Colon cancer Neg Hx    Esophageal cancer Neg Hx     Medications: Patient's Medications  New Prescriptions   No medications on file  Previous Medications   ACETAMINOPHEN (TYLENOL) 500 MG TABLET    Take 1,000 mg by mouth 2 (two) times a day.   ADVAIR HFA 115-21 MCG/ACT INHALER    TAKE 2 PUFFS BY MOUTH TWICE A DAY   ALBUTEROL (PROAIR HFA) 108 (90 BASE) MCG/ACT INHALER    INHALE 2 PUFFS EVERY 4 HOURS AS NEEDED INTO THE LUNGS FOR WHEEZING OR SHORTNESS OF BREATH J45.20   AMLODIPINE (NORVASC) 5 MG TABLET    TAKE 1 TABLET BY MOUTH EVERY DAY   APIXABAN (ELIQUIS) 2.5 MG TABS TABLET    Take one tablet by mouth twice daily   CETIRIZINE (ZYRTEC) 10 MG TABLET    Take 10 mg by mouth daily.   CHOLECALCIFEROL 50 MCG (2000 UT) CAPS    Take 2,000 Units by mouth daily.   ESOMEPRAZOLE (NEXIUM) 20 MG CAPSULE    TAKE 1 CAPSULE BY MOUTH EVERY DAY   LOSARTAN (COZAAR) 25 MG TABLET    TAKE 1 TABLET BY MOUTH EVERY DAY   OMEGA-3 FATTY ACIDS (FISH OIL MAXIMUM STRENGTH) 1200 MG CAPS    Take 1,200 mg by  mouth 2 (two) times daily.    PROPRANOLOL (INDERAL) 10 MG TABLET    TAKE 1 TABLET BY MOUTH TWICE A DAY   PROPYLENE GLYCOL (SYSTANE BALANCE) 0.6 % SOLN    Place 2 drops into both eyes 2 (two) times daily.   TRAMADOL (ULTRAM) 50 MG TABLET    Take  50 mg by mouth every 6 (six) hours as needed. 1/2 in the morning and 1/2 at night  Modified Medications   No medications on file  Discontinued Medications   No medications on file    Physical Exam:  Vitals:   02/08/21 1127  BP: (!) 160/70  Pulse: 74  Temp: 97.7 F (36.5 C)  TempSrc: Temporal  SpO2: 97%  Weight: 167 lb 6.4 oz (75.9 kg)  Height: 5' 1"  (1.549 m)   Body mass index is 31.63 kg/m. Wt Readings from Last 3 Encounters:  02/08/21 167 lb 6.4 oz (75.9 kg)  11/06/20 161 lb 12.8 oz (73.4 kg)  08/07/20 150 lb (68 kg)    Physical Exam Constitutional:      General: She is not in acute distress.    Appearance: She is well-developed. She is not diaphoretic.  HENT:     Head: Normocephalic and atraumatic.     Mouth/Throat:     Pharynx: No oropharyngeal exudate.  Eyes:     Conjunctiva/sclera: Conjunctivae normal.     Pupils: Pupils are equal, round, and reactive to light.  Cardiovascular:     Rate and Rhythm: Normal rate and regular rhythm.     Heart sounds: Normal heart sounds.  Pulmonary:     Effort: Pulmonary effort is normal.     Breath sounds: Normal breath sounds.  Abdominal:     General: Bowel sounds are normal.     Palpations: Abdomen is soft.  Musculoskeletal:     Cervical back: Normal range of motion and neck supple.     Right lower leg: No edema.     Left lower leg: No edema.  Skin:    General: Skin is warm and dry.  Neurological:     Mental Status: She is alert and oriented to person, place, and time.  Psychiatric:        Mood and Affect: Mood normal.    Labs reviewed: Basic Metabolic Panel: Recent Labs    05/08/20 1011 11/06/20 0943  NA 141 139  K 4.5 4.5  CL 106 103  CO2 28 26  GLUCOSE 93 101*   BUN 20 25  CREATININE 0.64 0.68  CALCIUM 9.4 9.9   Liver Function Tests: Recent Labs    05/08/20 1011 11/06/20 0943  AST 20 25  ALT 12 15  BILITOT 0.4 0.8  PROT 6.5 7.1   No results for input(s): LIPASE, AMYLASE in the last 8760 hours. No results for input(s): AMMONIA in the last 8760 hours. CBC: Recent Labs    05/08/20 1011 11/06/20 0943  WBC 6.1 6.8  NEUTROABS 1,879 2,156  HGB 13.0 14.1  HCT 39.0 42.7  MCV 87.4 88.8  PLT 273 334   Lipid Panel: Recent Labs    05/08/20 1011 11/06/20 0943 02/05/21 0839  CHOL 198 233* 254*  HDL 59 65 63  LDLCALC 118* 140* 164*  TRIG 99 149 136  CHOLHDL 3.4 3.6 4.0   TSH: No results for input(s): TSH in the last 8760 hours. A1C: Lab Results  Component Value Date   HGBA1C 5.5 11/06/2020     Assessment/Plan 1. Mixed hyperlipidemia Reviewed LDL which is very high. Discussed adding medication but Ms Barcia states she wants to work on dietary modifications at this time and not start medication. Will follow up lipids in 3 months. - Lipid Panel; Future - CMP with eGFR([facility]); Future  3. Chronic right-sided low back pain without sciatica -ongoing, worsening pain, she has been followed by neurosurgery, injection  offered but she has declined at this time. Continues on heating pad with tylenol PRN.   4. Essential hypertension Elevated today however reports she has been very stressed and rushed getting to office today. She will monitor at home and make dietary modifications. Will continue current regimen.  - CMP with eGFR([facility]); Future - CBC with Differential/Platelet; Future  5. Paroxysmal atrial fibrillation (HCC) Rate controlled, continue current medications.   6. Hyperglycemia Continue dietary modifications.   7. Asthma, allergic, mild intermittent, uncomplicated Stable, continues on advair twice daily   8. Allergic rhinitis, unspecified seasonality, unspecified trigger Reports worsening congestion at this time.  Started during her netipot to help with symptoms and saline nasal stray with zyrtec   9. Hepatic cirrhosis, unspecified hepatic cirrhosis type, unspecified whether ascites present ([place]) -continues on propranolol. Followed by GI.  - CMP with eGFR([facility]); Future    Next appt: 3 months with labs prior  Taggart Prasad K. [place], [facility] [phone]

## 2021-02-09 ENCOUNTER — Ambulatory Visit: Payer: PPO | Admitting: Nurse Practitioner

## 2021-02-14 ENCOUNTER — Other Ambulatory Visit: Payer: PPO

## 2021-02-14 ENCOUNTER — Ambulatory Visit (INDEPENDENT_AMBULATORY_CARE_PROVIDER_SITE_OTHER): Payer: PPO | Admitting: Gastroenterology

## 2021-02-14 ENCOUNTER — Encounter: Payer: Self-pay | Admitting: Gastroenterology

## 2021-02-14 VITALS — BP 138/68 | HR 73 | Ht 61.0 in | Wt 163.2 lb

## 2021-02-14 DIAGNOSIS — K219 Gastro-esophageal reflux disease without esophagitis: Secondary | ICD-10-CM

## 2021-02-14 DIAGNOSIS — Z86010 Personal history of colonic polyps: Secondary | ICD-10-CM | POA: Diagnosis not present

## 2021-02-14 DIAGNOSIS — I8500 Esophageal varices without bleeding: Secondary | ICD-10-CM | POA: Diagnosis not present

## 2021-02-14 DIAGNOSIS — K7460 Unspecified cirrhosis of liver: Secondary | ICD-10-CM | POA: Diagnosis not present

## 2021-02-14 NOTE — Patient Instructions (Signed)
If you are age 82 or older, your body mass index should be between 23-30. Your Body mass index is 30.85 kg/m. If this is out of the aforementioned range listed, please consider follow up with your Primary Care Provider.  If you are age 50 or younger, your body mass index should be between 19-25. Your Body mass index is 30.85 kg/m. If this is out of the aformentioned range listed, please consider follow up with your Primary Care Provider.   __________________________________________________________  The [facility] providers would like to encourage you to use [facility] to communicate with providers for non-urgent requests or questions.  Due to long hold times on the telephone, sending your provider a message by [facility] may be a faster and more efficient way to get a response.  Please allow 48 business hours for a response.  Please remember that this is for non-urgent requests.   You have been scheduled for an abdominal ultrasound at [facility] (1st floor of hospital) on Thursday, 10-13 at 10:00am. Please arrive 15 minutes prior to your appointment for registration. Make certain not to have anything to eat or drink 6 hours prior to your appointment. Should you need to reschedule your appointment, please contact radiology at [phone]. This test typically takes about 30 minutes to perform.   Continue your present medications.  Please go to the lab in the basement of our building to have lab work done as you leave today. Hit "B" for basement when you get on the elevator.  When the doors open the lab is on your left.  We will call you with the results. Thank you.  Please follow up in 6 months.  Thank you for entrusting me with your care and for choosing [facility], Dr. Rogers Cellar

## 2021-02-14 NOTE — Progress Notes (Signed)
HPI :  80 y/o female here for a follow up visit for cirrhosis, history of colon polyps, history of C. Difficile.  I have not seen her since August 2021.   Cirrhosis - compensated to date.  Recall she was diagnosed in 06/2017 when she had incidental finding of esophageal varices on EGD done to evaluate history of possible Barrett's. Suspected to be due to NASH given negative workup otherwise.  No decompensations over time.  She has been on low-dose propranolol due to a low resting heart rate in the 50s to 60s.  She has not had any problems with ascites, no evidence of hepatic encephalopathy or jaundice.  She is worked really hard at weight loss with diet, she started over 200 pounds and was now down to 139.  She states she has had a hard time maintaining that weight and has gained weight back up to 163 but continues to work on this.  She has been immunized hepatitis A.  Her last [place] screening was performed in 01/2020 with stable changes of her liver.  She does not really have any complaints since have last seen her.  She denies any problems with lower extremity edema or ascites.  She appears to be tolerating the propranolol.  She states she has had bleeding in the past from her nose that was treated per ENT while on Eliquis.  Recall she does have a history of C. difficile in the past while on PPI.  Over time we have tried to reduce her dose of Nexium and now currently on 20 mg once daily.  That has worked quite well to control her symptoms and she is happy with the management.  She has not had any recurrence of C. difficile since have seen her.  Her main complaint is chronic back pain from her scoliosis.  She is not a candidate for surgery.  She has really limited mobility and chronic back pain.  She is using Tylenol only and avoiding NSAIDs.  She is trying to avoid narcotic use.  She otherwise denies any problems with her bowels or blood in her stools.  She had a colonoscopy in 2019 with 9 adenomas noted,  we discussed if she wanted any further surveillance exams.     EGD 06/25/2017 -  No evidence of Barrett's, diminutive white plaques in the esophagus c/w candidiasis, suspected varices in mid esophagus, multiple gastric polyps, normal stomach / duodenum - fundic gland polyps   Colonoscopy 06/25/2017 - 9 small adenomas, a few small-mouthed diverticula were found in the sigmoid colon and ascending colon, Internal hemorrhoids were found during retroflexion. The hemorrhoids were moderate.    Colonoscopy 07/28/2008 - diverticulosis, one small adenoma, biopsies taken to rule out microscopic colitis and negative   EGD 9/209/2006 - 2cm segment of possible BE - bx show reflux changes only, no BE     Echo 11/23/19 - EF 60-65%   RUQ [place] 01/2020 - IMPRESSION: 1. Heterogeneously increased echogenicity of the liver in keeping with geographic areas of fatty infiltration. No discrete hepatic masses are identified. 2. Postcholecystectomy.    Past Medical History:  Diagnosis Date   ALLERGIC RHINITIS    Anxiety    Asthma    Blood type O+    Cervical strain, acute    Cirrhosis ([place])    Colon polyp 2010   adenoma   Diverticulosis of colon    Dizziness    Esophageal varices (HCC)    Fibromyalgia    GERD (gastroesophageal reflux disease)  History of nephrolithiasis    Hypercholesterolemia    borderline   Hypertension    IBS (irritable bowel syndrome)    Osteoarthritis    Personal history of allergy to unspecified medicinal agent    Postconcussion syndrome    Vitamin D deficiency      Past Surgical History:  Procedure Laterality Date   CHOLECYSTECTOMY, LAPAROSCOPIC  2004   for gallstone pancreatitis   KNEE SURGERY Right    KNEE SURGERY Left    THYROID SURGERY     cyst removed   Family History  Problem Relation Age of Onset   Breast cancer Mother    Alzheimer's disease Mother    COPD Brother    Alcohol abuse Brother    Heart disease Sister    Breast cancer Sister 24   Alcohol  abuse Sister    Ovarian cancer Paternal Grandmother    Arthritis Other        Cousins    COPD Cousin        Maternal side    Heart attack Maternal Uncle    Colon cancer Neg Hx    Esophageal cancer Neg Hx    Social History   Tobacco Use   Smoking status: Former    Packs/day: 2.00    Years: 8.00    Pack years: 16.00    Types: Cigarettes    Quit date: 05/13/1962    Years since quitting: 58.8   Smokeless tobacco: Never  Vaping Use   Vaping Use: Never used  Substance Use Topics   Alcohol use: No   Drug use: No   Current Outpatient Medications  Medication Sig Dispense Refill   acetaminophen (TYLENOL) 500 MG tablet Take 1,000 mg by mouth 2 (two) times a day.     ADVAIR HFA 115-21 MCG/ACT inhaler TAKE 2 PUFFS BY MOUTH TWICE A DAY 12 g 5   albuterol (PROAIR HFA) 108 (90 Base) MCG/ACT inhaler INHALE 2 PUFFS EVERY 4 HOURS AS NEEDED INTO THE LUNGS FOR WHEEZING OR SHORTNESS OF BREATH J45.20 25.5 g 5   amLODipine (NORVASC) 5 MG tablet TAKE 1 TABLET BY MOUTH EVERY DAY 90 tablet 1   apixaban (ELIQUIS) 2.5 MG TABS tablet Take one tablet by mouth twice daily 60 tablet 1   cetirizine (ZYRTEC) 10 MG tablet Take 10 mg by mouth daily.     Cholecalciferol 50 MCG (2000 UT) CAPS Take 2,000 Units by mouth daily.     esomeprazole (NEXIUM) 20 MG capsule TAKE 1 CAPSULE BY MOUTH EVERY DAY 90 capsule 3   losartan (COZAAR) 25 MG tablet TAKE 1 TABLET BY MOUTH EVERY DAY 90 tablet 1   Omega-3 Fatty Acids (FISH OIL MAXIMUM STRENGTH) 1200 MG CAPS Take 1,200 mg by mouth 2 (two) times daily.      propranolol (INDERAL) 10 MG tablet TAKE 1 TABLET BY MOUTH TWICE A DAY 180 tablet 1   Propylene Glycol (SYSTANE BALANCE) 0.6 % SOLN Place 2 drops into both eyes 2 (two) times daily. 15 mL 0   No current facility-administered medications for this visit.   Allergies  Allergen Reactions   Ace Inhibitors     cough   Amoxicillin Itching    REACTION: unknown? pain in right kidney   Benicar Hct [Olmesartan Medoxomil-Hctz]      Extreme weakness   Budesonide-Formoterol Fumarate     Causes tremors and numbness   Chlorzoxazone Hives   Chlorzoxazone [Chlorzoxazone]    Citalopram Hydrobromide     REACTION: hives   Citalopram  Hydrobromide    Clonidine Derivatives    Cymbalta [Duloxetine Hcl]    Droperidol     REACTION: hives   Flexeril [Cyclobenzaprine Hcl]    Fluconazole Nausea And Vomiting and Other (See Comments)    fatigue   Formoterol Itching   Gabapentin Itching   Ketorolac Tromethamine     REACTION: hives   Ketorolac Tromethamine    Metoclopramide Hcl    Mometasone Furo-Formoterol Fum     Causes sore throat, blurred vision and unable to sleep--started on med 09-17-10   Mometasone Furoate Itching   Morphine     REACTION: hives and itching   Olmesartan Medoxomil     REACTION: fatique   Breo Ellipta [Fluticasone Furoate-Vilanterol] Itching    Pt reports itching with Memory Dance is related to being lactose intolerant.      Review of Systems: All systems reviewed and negative except where noted in HPI.   Lab Results  Component Value Date   WBC 6.8 11/06/2020   HGB 14.1 11/06/2020   HCT 42.7 11/06/2020   MCV 88.8 11/06/2020   PLT 334 11/06/2020    Lab Results  Component Value Date   CREATININE 0.68 11/06/2020   BUN 25 11/06/2020   NA 139 11/06/2020   K 4.5 11/06/2020   CL 103 11/06/2020   CO2 26 11/06/2020    Lab Results  Component Value Date   ALT 15 11/06/2020   AST 25 11/06/2020   ALKPHOS 52 11/25/2019   BILITOT 0.8 11/06/2020    Lab Results  Component Value Date   INR 1.0 07/13/2019   INR 0.9 09/02/2018   INR 0.9 12/18/2017      Physical Exam: BP 138/68   Pulse 73   Ht 5' 1"  (1.549 m)   Wt 163 lb 4 oz (74 kg)   BMI 30.85 kg/m  Constitutional: Pleasant,well-developed, female in no acute distress. Abdominal: Soft, nondistended, nontender.  There are no masses palpable.  Extremities: no edema Neurological: Alert and oriented to person place and time. No  asterixis Skin: Skin is warm and dry. No rashes noted. Psychiatric: Normal mood and affect. Behavior is normal.   ASSESSMENT AND PLAN: 80 year old female here for reassessment of following:  Cirrhosis Esophageal varices GERD History of colon polyps  Cirrhosis is suspected to be related to NASH.  She has lost some weight since her diagnosis but has regained some since have seen her.  She has been compensated to date.  She does have moderate sized esophageal varices that have been treated with propranolol at low-dose due to her low resting heart rate, she appears to be tolerating this well.  She is on Eliquis and at increased risk for bleeding from the varices but she has tolerated this well so far, given her CVA risk she had discontinued Eliquis.  She will continue propranolol for now.  She is due for [facility] screening.  Her last ultrasound did not show overt cirrhosis but she has had this noted on prior ultrasound and CT scan as well, will repeat another ultrasound now as its been a year since her last check.  She is due for AFP level.  Her labs are otherwise up-to-date.  She is avoiding alcohol.  Mobility seems to be her major issue at this time due to low back pain.  No further surveillance endoscopy will be done given she is on propranolol.  She is otherwise doing quite well on lower dose Nexium which is controlling her symptoms, we will continue that for now.  We discussed if she wanted to have any further surveillance colonoscopies given she had 9 adenomas removed in 2019.  All polyps were small, no high risk lesions.  Following discussion of this issue, given her age and other medical problems we will forego further surveillance colonoscopy, which is her preference.  Plan: - RUQ [place] for [place] screening - AFP level - continue nexium - continue propranolol, at higher risk for bleeding on Eliquis but tolerating it - holding off on EGD/ colonoscopy due to age / comorbidities - f/u 6 months  Jolly Mango, MD [facility]

## 2021-02-15 LAB — AFP TUMOR MARKER: AFP-Tumor Marker: 3.1 ng/mL

## 2021-02-19 ENCOUNTER — Ambulatory Visit: Payer: PPO | Admitting: Podiatry

## 2021-02-22 ENCOUNTER — Ambulatory Visit (HOSPITAL_COMMUNITY)
Admission: RE | Admit: 2021-02-22 | Discharge: 2021-02-22 | Disposition: A | Discharge status: Home / Self Care | Payer: PPO | Source: Ambulatory Visit | Attending: Gastroenterology | Admitting: Gastroenterology

## 2021-02-22 ENCOUNTER — Other Ambulatory Visit: Payer: Self-pay

## 2021-02-22 DIAGNOSIS — I8500 Esophageal varices without bleeding: Secondary | ICD-10-CM | POA: Diagnosis not present

## 2021-02-22 DIAGNOSIS — Z86010 Personal history of colonic polyps: Secondary | ICD-10-CM | POA: Diagnosis not present

## 2021-02-22 DIAGNOSIS — K7460 Unspecified cirrhosis of liver: Secondary | ICD-10-CM | POA: Diagnosis not present

## 2021-02-22 DIAGNOSIS — K219 Gastro-esophageal reflux disease without esophagitis: Secondary | ICD-10-CM | POA: Insufficient documentation

## 2021-02-26 ENCOUNTER — Telehealth: Payer: Self-pay | Admitting: Gastroenterology

## 2021-02-26 NOTE — Telephone Encounter (Signed)
Patient is returning your call.  

## 2021-02-26 NOTE — Telephone Encounter (Signed)
See imaging results from 10-13

## 2021-02-27 ENCOUNTER — Other Ambulatory Visit: Payer: Self-pay

## 2021-02-27 ENCOUNTER — Ambulatory Visit (INDEPENDENT_AMBULATORY_CARE_PROVIDER_SITE_OTHER): Payer: PPO | Admitting: *Deleted

## 2021-02-27 DIAGNOSIS — Z23 Encounter for immunization: Secondary | ICD-10-CM

## 2021-03-02 DIAGNOSIS — Z7901 Long term (current) use of anticoagulants: Secondary | ICD-10-CM | POA: Diagnosis not present

## 2021-03-02 DIAGNOSIS — I10 Essential (primary) hypertension: Secondary | ICD-10-CM | POA: Diagnosis not present

## 2021-03-02 DIAGNOSIS — Z6841 Body Mass Index (BMI) 40.0 and over, adult: Secondary | ICD-10-CM | POA: Diagnosis not present

## 2021-03-02 DIAGNOSIS — Z85828 Personal history of other malignant neoplasm of skin: Secondary | ICD-10-CM | POA: Diagnosis not present

## 2021-03-02 DIAGNOSIS — Z87891 Personal history of nicotine dependence: Secondary | ICD-10-CM | POA: Diagnosis not present

## 2021-03-02 DIAGNOSIS — I480 Paroxysmal atrial fibrillation: Secondary | ICD-10-CM | POA: Diagnosis not present

## 2021-03-02 DIAGNOSIS — J42 Unspecified chronic bronchitis: Secondary | ICD-10-CM | POA: Diagnosis not present

## 2021-03-22 ENCOUNTER — Other Ambulatory Visit: Payer: Self-pay | Admitting: Nurse Practitioner

## 2021-03-22 DIAGNOSIS — I10 Essential (primary) hypertension: Secondary | ICD-10-CM

## 2021-03-26 ENCOUNTER — Other Ambulatory Visit: Payer: Self-pay | Admitting: Nurse Practitioner

## 2021-03-26 DIAGNOSIS — R809 Proteinuria, unspecified: Secondary | ICD-10-CM

## 2021-03-26 DIAGNOSIS — I10 Essential (primary) hypertension: Secondary | ICD-10-CM

## 2021-03-26 DIAGNOSIS — R7303 Prediabetes: Secondary | ICD-10-CM

## 2021-04-09 DIAGNOSIS — M533 Sacrococcygeal disorders, not elsewhere classified: Secondary | ICD-10-CM | POA: Diagnosis not present

## 2021-04-16 DIAGNOSIS — M533 Sacrococcygeal disorders, not elsewhere classified: Secondary | ICD-10-CM | POA: Diagnosis not present

## 2021-04-30 DIAGNOSIS — M25561 Pain in right knee: Secondary | ICD-10-CM | POA: Diagnosis not present

## 2021-04-30 DIAGNOSIS — M25562 Pain in left knee: Secondary | ICD-10-CM | POA: Diagnosis not present

## 2021-05-08 ENCOUNTER — Other Ambulatory Visit: Payer: Self-pay

## 2021-05-08 ENCOUNTER — Other Ambulatory Visit: Payer: PPO

## 2021-05-08 DIAGNOSIS — K7460 Unspecified cirrhosis of liver: Secondary | ICD-10-CM

## 2021-05-08 DIAGNOSIS — I10 Essential (primary) hypertension: Secondary | ICD-10-CM

## 2021-05-08 DIAGNOSIS — E782 Mixed hyperlipidemia: Secondary | ICD-10-CM | POA: Diagnosis not present

## 2021-05-09 LAB — COMPLETE METABOLIC PANEL WITH GFR
AG Ratio: 1.8 (calc) (ref 1.0–2.5)
ALT: 15 U/L (ref 6–29)
AST: 21 U/L (ref 10–35)
Albumin: 4.5 g/dL (ref 3.6–5.1)
Alkaline phosphatase (APISO): 78 U/L (ref 37–153)
BUN: 17 mg/dL (ref 7–25)
CO2: 29 mmol/L (ref 20–32)
Calcium: 9.5 mg/dL (ref 8.6–10.4)
Chloride: 101 mmol/L (ref 98–110)
Creat: 0.68 mg/dL (ref 0.60–0.95)
Globulin: 2.5 g/dL (calc) (ref 1.9–3.7)
Glucose, Bld: 92 mg/dL (ref 65–99)
Potassium: 4.1 mmol/L (ref 3.5–5.3)
Sodium: 139 mmol/L (ref 135–146)
Total Bilirubin: 1 mg/dL (ref 0.2–1.2)
Total Protein: 7 g/dL (ref 6.1–8.1)
eGFR: 87 mL/min/{1.73_m2} (ref >=60)

## 2021-05-09 LAB — CBC WITH DIFFERENTIAL/PLATELET
Absolute Monocytes: 687 cells/uL (ref 200–950)
Basophils Absolute: 40 cells/uL (ref 0–200)
Basophils Relative: 0.5 %
Eosinophils Absolute: 79 cells/uL (ref 15–500)
Eosinophils Relative: 1 %
HCT: 43.5 % (ref 35.0–45.0)
Hemoglobin: 14.8 g/dL (ref 11.7–15.5)
Lymphs Abs: 3042 cells/uL (ref 850–3900)
MCH: 29.9 pg (ref 27.0–33.0)
MCHC: 34 g/dL (ref 32.0–36.0)
MCV: 87.9 fL (ref 80.0–100.0)
MPV: 9.6 fL (ref 7.5–12.5)
Monocytes Relative: 8.7 %
Neutro Abs: 4053 cells/uL (ref 1500–7800)
Neutrophils Relative %: 51.3 %
Platelets: 310 10*3/uL (ref 140–400)
RBC: 4.95 10*6/uL (ref 3.80–5.10)
RDW: 13 % (ref 11.0–15.0)
Total Lymphocyte: 38.5 %
WBC: 7.9 10*3/uL (ref 3.8–10.8)

## 2021-05-09 LAB — LIPID PANEL
Cholesterol: 239 mg/dL — ABNORMAL HIGH (ref <200)
HDL: 66 mg/dL (ref >=50)
LDL Cholesterol (Calc): 148 mg/dL (calc) — ABNORMAL HIGH
Non-HDL Cholesterol (Calc): 173 mg/dL (calc) — ABNORMAL HIGH (ref <130)
Total CHOL/HDL Ratio: 3.6 (calc) (ref <5.0)
Triglycerides: 124 mg/dL (ref <150)

## 2021-05-11 ENCOUNTER — Encounter: Payer: Self-pay | Admitting: Nurse Practitioner

## 2021-05-11 ENCOUNTER — Other Ambulatory Visit: Payer: Self-pay

## 2021-05-11 ENCOUNTER — Ambulatory Visit (INDEPENDENT_AMBULATORY_CARE_PROVIDER_SITE_OTHER): Payer: PPO | Admitting: Nurse Practitioner

## 2021-05-11 VITALS — BP 132/78 | HR 60 | Temp 96.9°F | Ht 61.0 in | Wt 167.0 lb

## 2021-05-11 DIAGNOSIS — K219 Gastro-esophageal reflux disease without esophagitis: Secondary | ICD-10-CM

## 2021-05-11 DIAGNOSIS — M542 Cervicalgia: Secondary | ICD-10-CM

## 2021-05-11 DIAGNOSIS — J4520 Mild intermittent asthma, uncomplicated: Secondary | ICD-10-CM | POA: Diagnosis not present

## 2021-05-11 DIAGNOSIS — Z66 Do not resuscitate: Secondary | ICD-10-CM | POA: Diagnosis not present

## 2021-05-11 DIAGNOSIS — I10 Essential (primary) hypertension: Secondary | ICD-10-CM | POA: Diagnosis not present

## 2021-05-11 DIAGNOSIS — I480 Paroxysmal atrial fibrillation: Secondary | ICD-10-CM

## 2021-05-11 DIAGNOSIS — R739 Hyperglycemia, unspecified: Secondary | ICD-10-CM | POA: Diagnosis not present

## 2021-05-11 DIAGNOSIS — E559 Vitamin D deficiency, unspecified: Secondary | ICD-10-CM

## 2021-05-11 DIAGNOSIS — E782 Mixed hyperlipidemia: Secondary | ICD-10-CM

## 2021-05-11 MED ORDER — PREDNISONE 20 MG PO TABS: 20.0000 mg | ORAL_TABLET | Freq: Two times a day (BID) | ORAL | 0 refills | Status: DC

## 2021-05-11 NOTE — Patient Instructions (Signed)
For neck pain- heating pad 3 times daily with muscle rub after heat Prednisone twice daily with food for 3 days

## 2021-05-11 NOTE — Progress Notes (Signed)
Careteam: Patient Care Team: Lauree Chandler, NP as PCP - General (Nurse Practitioner)  PLACE OF SERVICE:  [place] Directive information Does Patient Have a Medical Advance Directive?: Yes, Type of Advance Directive: Out of facility DNR (pink MOST or yellow form), Pre-existing out of facility DNR order (yellow form or pink MOST form): Yellow form placed in chart (order not valid for inpatient use);Pink MOST form placed in chart (order not valid for inpatient use), Does patient want to make changes to medical advance directive?: No - Patient declined  Allergies  Allergen Reactions   Ace Inhibitors     cough   Amoxicillin Itching    REACTION: unknown? pain in right kidney   Benicar Hct [Olmesartan Medoxomil-Hctz]     Extreme weakness   Budesonide-Formoterol Fumarate     Causes tremors and numbness   Chlorzoxazone Hives   Chlorzoxazone [Chlorzoxazone]    Citalopram Hydrobromide     REACTION: hives   Citalopram Hydrobromide    Clonidine Derivatives    Cymbalta [Duloxetine Hcl]    Droperidol     REACTION: hives   Flexeril [Cyclobenzaprine Hcl]    Fluconazole Nausea And Vomiting and Other (See Comments)    fatigue   Formoterol Itching   Gabapentin Itching   Ketorolac Tromethamine     REACTION: hives   Ketorolac Tromethamine    Metoclopramide Hcl    Mometasone Furo-Formoterol Fum     Causes sore throat, blurred vision and unable to sleep--started on med 09-17-10   Mometasone Furoate Itching   Morphine     REACTION: hives and itching   Olmesartan Medoxomil     REACTION: fatique   Breo Ellipta [Fluticasone Furoate-Vilanterol] Itching    Pt reports itching with Memory Dance is related to being lactose intolerant.     Chief Complaint  Patient presents with   Medical Management of Chronic Issues    3 month follow-up and discuss labs (copy printed). Discuss need for covid booster or post pone if patient refuses.      HPI: Patient is a 80 y.o. female for routine  follow up.   Reports she has had a "crick in her neck" bubbling in her ear and ache. Reports also pain going down neck on left side. Went to ENT to have evaluated but ear looked good at that time. Has been more painful over the last week.   Followed by Dr Havery Moros for cirrhosis of liver. Last follow up in October. Continues on propranolol   Chronic low back pain- followed by neurology and ortho for pain on spine. She finally agreed to get injections and this has helped her significantly  OA of the knee- plans to get injection in January   Pain in back and knee have inhibited her from increasing physical activity.   Htn-controlled on amlodipine and losartan and propranolol   Hyperlipidemia- did not wish to start medication at last OV, follow up LDL has improved but continues to be over goal at 148.   Asthma- been doing well. No recent flares  GERD- doing well on nexium 20 mg daily  Review of Systems:  Review of Systems  Constitutional:  Negative for chills, fever and weight loss.  HENT:  Negative for tinnitus.   Respiratory:  Negative for cough, sputum production and shortness of breath.   Cardiovascular:  Negative for chest pain, palpitations and leg swelling.  Gastrointestinal:  Negative for abdominal pain, constipation, diarrhea and heartburn.  Genitourinary:  Negative for dysuria, frequency and  urgency.  Musculoskeletal:  Positive for back pain, joint pain, myalgias and neck pain. Negative for falls.  Skin: Negative.   Neurological:  Negative for dizziness and headaches.  Psychiatric/Behavioral:  Negative for depression and memory loss. The patient does not have insomnia.    Past Medical History:  Diagnosis Date   ALLERGIC RHINITIS    Anxiety    Asthma    Blood type O+    Cervical strain, acute    Cirrhosis ([place])    Colon polyp 2010   adenoma   Diverticulosis of colon    Dizziness    Esophageal varices (HCC)    Fibromyalgia    GERD (gastroesophageal reflux disease)     History of nephrolithiasis    Hypercholesterolemia    borderline   Hypertension    IBS (irritable bowel syndrome)    Osteoarthritis    Personal history of allergy to unspecified medicinal agent    Postconcussion syndrome    Vitamin D deficiency    Past Surgical History:  Procedure Laterality Date   CHOLECYSTECTOMY, LAPAROSCOPIC  2004   for gallstone pancreatitis   KNEE SURGERY Right    KNEE SURGERY Left    THYROID SURGERY     cyst removed   Social History:   reports that she quit smoking about 59 years ago. Her smoking use included cigarettes. She has a 16.00 pack-year smoking history. She has never used smokeless tobacco. She reports that she does not drink alcohol and does not use drugs.  Family History  Problem Relation Age of Onset   Breast cancer Mother    Alzheimer's disease Mother    COPD Brother    Alcohol abuse Brother    Heart disease Sister    Breast cancer Sister 5   Alcohol abuse Sister    Ovarian cancer Paternal Grandmother    Arthritis Other        Cousins    COPD Cousin        Maternal side    Heart attack Maternal Uncle    Colon cancer Neg Hx    Esophageal cancer Neg Hx     Medications: Patient's Medications  New Prescriptions   No medications on file  Previous Medications   ACETAMINOPHEN (TYLENOL) 500 MG TABLET    Take 1,000 mg by mouth 2 (two) times a day.   ADVAIR HFA 115-21 MCG/ACT INHALER    TAKE 2 PUFFS BY MOUTH TWICE A DAY   ALBUTEROL (PROAIR HFA) 108 (90 BASE) MCG/ACT INHALER    INHALE 2 PUFFS EVERY 4 HOURS AS NEEDED INTO THE LUNGS FOR WHEEZING OR SHORTNESS OF BREATH J45.20   AMLODIPINE (NORVASC) 5 MG TABLET    Take 1 tablet (5 mg total) by mouth daily. I10   APIXABAN (ELIQUIS) 2.5 MG TABS TABLET    TAKE 1 TABLET BY MOUTH TWICE A DAY   CETIRIZINE (ZYRTEC) 10 MG TABLET    Take 10 mg by mouth daily.   CHOLECALCIFEROL 50 MCG (2000 UT) CAPS    Take 2,000 Units by mouth daily.   ESOMEPRAZOLE (NEXIUM) 20 MG CAPSULE    TAKE 1 CAPSULE BY MOUTH  EVERY DAY   LOSARTAN (COZAAR) 25 MG TABLET    TAKE 1 TABLET BY MOUTH EVERY DAY   OMEGA-3 FATTY ACIDS (FISH OIL MAXIMUM STRENGTH) 1200 MG CAPS    Take 1,200 mg by mouth 2 (two) times daily.    PROPRANOLOL (INDERAL) 10 MG TABLET    TAKE 1 TABLET BY MOUTH TWICE A DAY  PROPYLENE GLYCOL (SYSTANE BALANCE) 0.6 % SOLN    Place 2 drops into both eyes 2 (two) times daily.   SODIUM CHLORIDE (OCEAN) 0.65 % NASAL SPRAY    Place 1 spray into the nose daily.  Modified Medications   No medications on file  Discontinued Medications   No medications on file    Physical Exam:  Vitals:   05/11/21 0926  BP: 132/78  Pulse: 60  Temp: (!) 96.9 F (36.1 C)  TempSrc: Temporal  SpO2: 96%  Weight: 167 lb (75.8 kg)  Height: 5' 1"  (1.549 m)   Body mass index is 31.55 kg/m. Wt Readings from Last 3 Encounters:  05/11/21 167 lb (75.8 kg)  02/14/21 163 lb 4 oz (74 kg)  02/08/21 167 lb 6.4 oz (75.9 kg)    Physical Exam Constitutional:      General: She is not in acute distress.    Appearance: She is well-developed. She is not diaphoretic.  HENT:     Head: Normocephalic and atraumatic.     Mouth/Throat:     Pharynx: No oropharyngeal exudate.  Eyes:     Conjunctiva/sclera: Conjunctivae normal.     Pupils: Pupils are equal, round, and reactive to light.  Neck:   Cardiovascular:     Rate and Rhythm: Normal rate and regular rhythm.     Heart sounds: Normal heart sounds.  Pulmonary:     Effort: Pulmonary effort is normal.     Breath sounds: Normal breath sounds.  Abdominal:     General: Bowel sounds are normal.     Palpations: Abdomen is soft.  Musculoskeletal:     Cervical back: Normal range of motion and neck supple. Tenderness present.     Right lower leg: No edema.     Left lower leg: No edema.  Skin:    General: Skin is warm and dry.  Neurological:     Mental Status: She is alert.  Psychiatric:        Mood and Affect: Mood normal.    Labs reviewed: Basic Metabolic Panel: Recent Labs     11/06/20 0943 05/08/21 0921  NA 139 139  K 4.5 4.1  CL 103 101  CO2 26 29  GLUCOSE 101* 92  BUN 25 17  CREATININE 0.68 0.68  CALCIUM 9.9 9.5   Liver Function Tests: Recent Labs    11/06/20 0943 05/08/21 0921  AST 25 21  ALT 15 15  BILITOT 0.8 1.0  PROT 7.1 7.0   No results for input(s): LIPASE, AMYLASE in the last 8760 hours. No results for input(s): AMMONIA in the last 8760 hours. CBC: Recent Labs    11/06/20 0943 05/08/21 0921  WBC 6.8 7.9  NEUTROABS 2,156 4,053  HGB 14.1 14.8  HCT 42.7 43.5  MCV 88.8 87.9  PLT 334 310   Lipid Panel: Recent Labs    11/06/20 0943 02/05/21 0839 05/08/21 0921  CHOL 233* 254* 239*  HDL 65 63 66  LDLCALC 140* 164* 148*  TRIG 149 136 124  CHOLHDL 3.6 4.0 3.6   TSH: No results for input(s): TSH in the last 8760 hours. A1C: Lab Results  Component Value Date   HGBA1C 5.5 11/06/2020     Assessment/Plan 1. Mixed hyperlipidemia -LDL have improved but not at goal. She does not wish to take medication for cholesterol at this time -will continue to work on diet  2. Essential hypertension -Blood pressure well controlled Continue current medications follow metabolic panel  3. Paroxysmal atrial fibrillation (HCC) -  rate controlled. Continues on eliquis. Without abnormal bleeding or bruising  4. Hyperglycemia -stable on recent lab  5. Asthma, allergic, mild intermittent, uncomplicated -well controlled at this time. No recent flares.   6. Gastroesophageal reflux disease without esophagitis Well controlled on nexium  7. Vitamin D deficiency Continues on vit d supplement  8. Neck pain on left side -to use heating pad TID ~20 mins follow up muscle rub - predniSONE (DELTASONE) 20 MG tablet; Take 1 tablet (20 mg total) by mouth 2 (two) times daily with a meal.  Dispense: 6 tablet; Refill: 0  9. DNR (do not resuscitate) - Do not attempt resuscitation (DNR)   Next appt: 3 months.  Carlos American. [place],  [facility] [phone]

## 2021-05-22 DIAGNOSIS — D485 Neoplasm of uncertain behavior of skin: Secondary | ICD-10-CM | POA: Diagnosis not present

## 2021-05-22 DIAGNOSIS — C4441 Basal cell carcinoma of skin of scalp and neck: Secondary | ICD-10-CM | POA: Diagnosis not present

## 2021-05-22 DIAGNOSIS — L821 Other seborrheic keratosis: Secondary | ICD-10-CM | POA: Diagnosis not present

## 2021-05-22 DIAGNOSIS — Z85828 Personal history of other malignant neoplasm of skin: Secondary | ICD-10-CM | POA: Diagnosis not present

## 2021-05-31 DIAGNOSIS — M25562 Pain in left knee: Secondary | ICD-10-CM | POA: Diagnosis not present

## 2021-05-31 DIAGNOSIS — M25561 Pain in right knee: Secondary | ICD-10-CM | POA: Diagnosis not present

## 2021-06-02 ENCOUNTER — Other Ambulatory Visit: Payer: Self-pay | Admitting: Nurse Practitioner

## 2021-06-04 NOTE — Telephone Encounter (Signed)
Patient has request refill on medication "Advair 115-17mg". Patient last refill dated 09/19/2020. Patient medication has many Allergy Contraindications. Medication pend and sent to PCP EDewaine OatsJCarlos American NP for approval. Please Advise.

## 2021-06-13 DIAGNOSIS — M25562 Pain in left knee: Secondary | ICD-10-CM | POA: Diagnosis not present

## 2021-06-13 DIAGNOSIS — M25561 Pain in right knee: Secondary | ICD-10-CM | POA: Diagnosis not present

## 2021-06-22 DIAGNOSIS — Z87891 Personal history of nicotine dependence: Secondary | ICD-10-CM | POA: Diagnosis not present

## 2021-06-22 DIAGNOSIS — D692 Other nonthrombocytopenic purpura: Secondary | ICD-10-CM | POA: Diagnosis not present

## 2021-06-22 DIAGNOSIS — I10 Essential (primary) hypertension: Secondary | ICD-10-CM | POA: Diagnosis not present

## 2021-06-22 DIAGNOSIS — J42 Unspecified chronic bronchitis: Secondary | ICD-10-CM | POA: Diagnosis not present

## 2021-07-05 ENCOUNTER — Telehealth: Payer: Self-pay | Admitting: *Deleted

## 2021-07-05 NOTE — Telephone Encounter (Signed)
Letter printed off and I called patient to confirm if she wanted letter mailed or if she wanted to come by office to pick it up. Patient didn't answer, voicemail was left.

## 2021-07-05 NOTE — Telephone Encounter (Signed)
Letter completed.

## 2021-07-05 NOTE — Telephone Encounter (Signed)
Patient called and left message on Clinical intake stating that she needs a letter to give to her Radiographer, therapeutic.   Stated that she is up on the 3rd floor and she needs to be on the 1st. Stated that it is much harder to get up and down on floors of her apartment and they are fixing to do renovations on the elevators which will make it more difficult.   Patient is requesting a letter from Janett [employer] stating that she needs an apartment on the 1st floor.   Please Advise.

## 2021-07-06 NOTE — Telephone Encounter (Signed)
Patient requested letter to be mailed to her home address.  Address confirmed and mailed.

## 2021-07-06 NOTE — Telephone Encounter (Signed)
LMOM to return call.

## 2021-07-11 ENCOUNTER — Telehealth: Payer: Self-pay | Admitting: Cardiovascular Disease

## 2021-07-11 NOTE — Telephone Encounter (Signed)
Patient returning call.

## 2021-07-11 NOTE — Telephone Encounter (Signed)
Spoke patient.  She felt her heart pound once or twice last week while laying on her left side.  She has had that throughout her lifetime.  Once she turns off her left side it goes away.  She got to thinking about the time she had afib at the hospital once and worries if she could be feeling it again.  She also had a little nausea.  This is resolved w no fever, vomiting or loose stools.   ? ?I moved her appointment up to this Friday.  She is due for one year follow up at this time anyway. ?

## 2021-07-11 NOTE — Telephone Encounter (Signed)
Left message for patient to call back  

## 2021-07-11 NOTE — Telephone Encounter (Signed)
?  Patient c/o Palpitations:  High priority if patient c/o lightheadedness, shortness of breath, or chest pain ? ?How long have you had palpitations/irregular HR/ Afib? Are you having the symptoms now? No  ? ?Are you currently experiencing lightheadedness, SOB or CP? None  ? ?Do you have a history of afib (atrial fibrillation) or irregular heart rhythm? Yes  ? ?Have you checked your BP or HR? (document readings if available): pt don't have recent BP or HR ? ?Are you experiencing any other symptoms? Pt said, last week she suddenly felt heart pounding when laying on her side, its bad enough that it wakes her up but when she moved and lay on the opposite side it will go away. She is requesting to speak with Dr. Camillia Herter nurse ?

## 2021-07-11 NOTE — Telephone Encounter (Signed)
Patient is returning call.  °

## 2021-07-13 ENCOUNTER — Ambulatory Visit: Payer: PPO | Admitting: Cardiovascular Disease

## 2021-07-20 DIAGNOSIS — M25561 Pain in right knee: Secondary | ICD-10-CM | POA: Diagnosis not present

## 2021-07-23 ENCOUNTER — Encounter: Payer: Self-pay | Admitting: Cardiovascular Disease

## 2021-07-23 ENCOUNTER — Other Ambulatory Visit: Payer: Self-pay

## 2021-07-23 ENCOUNTER — Ambulatory Visit: Payer: PPO | Admitting: Cardiovascular Disease

## 2021-07-23 VITALS — BP 110/72 | HR 73 | Ht 62.0 in | Wt 170.0 lb

## 2021-07-23 DIAGNOSIS — I480 Paroxysmal atrial fibrillation: Secondary | ICD-10-CM

## 2021-07-23 MED ORDER — APIXABAN 2.5 MG PO TABS: 2.5000 mg | ORAL_TABLET | Freq: Two times a day (BID) | ORAL | 1 refills | Status: DC

## 2021-07-23 NOTE — Telephone Encounter (Signed)
Pt last saw Dr Angelena Form 07/23/21, last labs 05/08/21 Creat 0.68, age 81, weight 77.1kg, based on specified criteria pt is on appropriate dosage of Eliquis 52m BID for afib.  Will refill rx,  ?

## 2021-07-23 NOTE — Progress Notes (Signed)
Chief Complaint  Patient presents with   Follow-up    Atrial fibrillation   History of Present Illness: 81 yo female with history of paroxysmal atrial fibrillation, anxiety, asthma, fibromyalgia, GERD, HTN, HLD, IBS and cirrhosis secondary to NASH with esophageal varices here today for cardiac follow up. I saw her as a new patient in September 2021 for management of atrial fibrillation. She was admitted to [facility] in July 2021 with fever and UTI and was found to have atrial fibrillation. She was also treated for C diff. She was started on Eliquis and her beta blocker was continued. Echo 11/23/19 with LVEF=60-65%. No significant valve disease.She was in sinus at her office visit here in September 2021 and in March 2022.   She is here today for follow up. The patient denies any chest pain, dyspnea, palpitations, lower extremity edema, orthopnea, PND, dizziness, near syncope or syncope.   Primary Care Physician: Lauree Chandler, NP   Past Medical History:  Diagnosis Date   ALLERGIC RHINITIS    Anxiety    Asthma    Blood type O+    Cervical strain, acute    Cirrhosis ([place])    Colon polyp 2010   adenoma   Diverticulosis of colon    Dizziness    Esophageal varices (HCC)    Fibromyalgia    GERD (gastroesophageal reflux disease)    History of nephrolithiasis    Hypercholesterolemia    borderline   Hypertension    IBS (irritable bowel syndrome)    Osteoarthritis    Personal history of allergy to unspecified medicinal agent    Postconcussion syndrome    Vitamin D deficiency     Past Surgical History:  Procedure Laterality Date   CHOLECYSTECTOMY, LAPAROSCOPIC  2004   for gallstone pancreatitis   KNEE SURGERY Right    KNEE SURGERY Left    THYROID SURGERY     cyst removed    Current Outpatient Medications  Medication Sig Dispense Refill   acetaminophen (TYLENOL) 500 MG tablet Take 1,000 mg by mouth 2 (two) times a day.     ADVAIR HFA 115-21 MCG/ACT inhaler TAKE 2 PUFFS BY  MOUTH TWICE A DAY 12 each 5   albuterol (PROAIR HFA) 108 (90 Base) MCG/ACT inhaler INHALE 2 PUFFS EVERY 4 HOURS AS NEEDED INTO THE LUNGS FOR WHEEZING OR SHORTNESS OF BREATH J45.20 25.5 g 5   amLODipine (NORVASC) 5 MG tablet Take 1 tablet (5 mg total) by mouth daily. I10 90 tablet 1   apixaban (ELIQUIS) 2.5 MG TABS tablet TAKE 1 TABLET BY MOUTH TWICE A DAY 180 tablet 1   cetirizine (ZYRTEC) 10 MG tablet Take 10 mg by mouth daily.     Cholecalciferol 50 MCG (2000 UT) CAPS Take 2,000 Units by mouth daily.     esomeprazole (NEXIUM) 20 MG capsule TAKE 1 CAPSULE BY MOUTH EVERY DAY 90 capsule 3   losartan (COZAAR) 25 MG tablet TAKE 1 TABLET BY MOUTH EVERY DAY 90 tablet 3   Omega-3 Fatty Acids (FISH OIL MAXIMUM STRENGTH) 1200 MG CAPS Take 1,200 mg by mouth 2 (two) times daily.      propranolol (INDERAL) 10 MG tablet TAKE 1 TABLET BY MOUTH TWICE A DAY 180 tablet 1   Propylene Glycol (SYSTANE BALANCE) 0.6 % SOLN Place 2 drops into both eyes 2 (two) times daily. 15 mL 0   sodium chloride (OCEAN) 0.65 % nasal spray Place 1 spray into the nose daily.     predniSONE (DELTASONE) 20 MG  tablet Take 1 tablet (20 mg total) by mouth 2 (two) times daily with a meal. (Patient not taking: Reported on 07/23/2021) 6 tablet 0   No current facility-administered medications for this visit.    Allergies  Allergen Reactions   Ace Inhibitors     cough   Amoxicillin Itching    REACTION: unknown? pain in right kidney   Benicar Hct [Olmesartan Medoxomil-Hctz]     Extreme weakness   Budesonide-Formoterol Fumarate     Causes tremors and numbness   Chlorzoxazone Hives   Chlorzoxazone [Chlorzoxazone]    Citalopram Hydrobromide     REACTION: hives   Citalopram Hydrobromide    Clonidine Derivatives    Cymbalta [Duloxetine Hcl]    Droperidol     REACTION: hives   Flexeril [Cyclobenzaprine Hcl]    Fluconazole Nausea And Vomiting and Other (See Comments)    fatigue   Formoterol Itching   Gabapentin Itching   Ketorolac  Tromethamine     REACTION: hives   Ketorolac Tromethamine    Metoclopramide Hcl    Mometasone Furo-Formoterol Fum     Causes sore throat, blurred vision and unable to sleep--started on med 09-17-10   Mometasone Furoate Itching   Morphine     REACTION: hives and itching   Olmesartan Medoxomil     REACTION: fatique   Breo Ellipta [Fluticasone Furoate-Vilanterol] Itching    Pt reports itching with Memory Dance is related to being lactose intolerant.     Social History   Socioeconomic History   Marital status: Divorced    Spouse name: Not on file   Number of children: 1   Years of education: Not on file   Highest education level: Not on file  Occupational History   Occupation: retired    Fish farm manager: [employer]: [employer]  Tobacco Use   Smoking status: Former    Packs/day: 2.00    Years: 8.00    Pack years: 16.00    Types: Cigarettes    Quit date: 05/13/1962    Years since quitting: 59.2   Smokeless tobacco: Never  Vaping Use   Vaping Use: Never used  Substance and Sexual Activity   Alcohol use: No   Drug use: No   Sexual activity: Not Currently    Birth control/protection: Post-menopausal  Other Topics Concern   Not on file  Social History Narrative   exercises 3x a week   drinks 1 cup caffeine every other day   Social Determinants of Health   Financial Resource Strain: Not on file  Food Insecurity: Not on file  Transportation Needs: Not on file  Physical Activity: Not on file  Stress: Not on file  Social Connections: Not on file  Intimate Partner Violence: Not on file    Family History  Problem Relation Age of Onset   Breast cancer Mother    Alzheimer's disease Mother    COPD Brother    Alcohol abuse Brother    Heart disease Sister    Breast cancer Sister 78   Alcohol abuse Sister    Ovarian cancer Paternal Grandmother    Arthritis Other        Cousins    COPD Cousin        Maternal side    Heart attack Maternal  Uncle    Colon cancer Neg Hx    Esophageal cancer Neg Hx     Review of Systems:  As stated in the HPI and otherwise negative.  BP 110/72    Pulse 73    Ht 5' 2"  (1.575 m)    Wt 170 lb (77.1 kg)    SpO2 98%    BMI 31.09 kg/m   Physical Examination: General: Well developed, well nourished, NAD  HEENT: OP clear, mucus membranes moist  SKIN: warm, dry. No rashes. Neuro: No focal deficits  Musculoskeletal: Muscle strength 5/5 all ext  Psychiatric: Mood and affect normal  Neck: No JVD, no carotid bruits, no thyromegaly, no lymphadenopathy.  Lungs:Clear bilaterally, no wheezes, rhonci, crackles Cardiovascular: Regular rate and rhythm. No murmurs, gallops or rubs. Abdomen:Soft. Bowel sounds present. Non-tender.  Extremities: No lower extremity edema. Pulses are 2 + in the bilateral DP/PT.  EKG:  EKG is  ordered today. The ekg ordered today demonstrates Sinus  Echo 11/23/19:  1. Left ventricular ejection fraction, by estimation, is 60 to 65%. The  left ventricle has normal function. The left ventricle has no regional  wall motion abnormalities. Left ventricular diastolic parameters are  indeterminate.   2. Right ventricular systolic function is normal. The right ventricular  size is normal. There is mildly elevated pulmonary artery systolic  pressure.   3. Left atrial size was mild to moderately dilated.   4. The mitral valve is normal in structure. Trivial mitral valve  regurgitation. No evidence of mitral stenosis.   5. The aortic valve is tricuspid. Aortic valve regurgitation is not  visualized. No aortic stenosis is present.   6. The inferior vena cava is normal in size with <50% respiratory  variability, suggesting right atrial pressure of 8 mmHg.   Recent Labs: 05/08/2021: ALT 15; BUN 17; Creat 0.68; Hemoglobin 14.8; Platelets 310; Potassium 4.1; Sodium 139   Lipid Panel    Component Value Date/Time   CHOL 239 (H) 05/08/2021 0921   CHOL 217 (H) 11/10/2015 0903   TRIG  124 05/08/2021 0921   HDL 66 05/08/2021 0921   HDL 44 11/10/2015 0903   CHOLHDL 3.6 05/08/2021 0921   VLDL 27 12/31/2016 0851   LDLCALC 148 (H) 05/08/2021 0921   LDLDIRECT 102.5 07/02/2012 1100     Wt Readings from Last 3 Encounters:  07/23/21 170 lb (77.1 kg)  05/11/21 167 lb (75.8 kg)  02/14/21 163 lb 4 oz (74 kg)      Assessment and Plan:   1. Atrial fibrillation, paroxysmal: Sinus today. No palpitations. CHADS VASC score of 4. Continue beta blocker and Eliquis.   Current medicines are reviewed at length with the patient today.  The patient does not have concerns regarding medicines.  The following changes have been made:  no change  Labs/ tests ordered today include:   Orders Placed This Encounter  Procedures   EKG 12-Lead   Disposition:   F/U with me in 12 months.   Signed, Lauree Chandler, MD 07/23/2021 9:23 AM    [address], [place], [place]  [address] Phone: [phone]; Fax: [phone]

## 2021-07-23 NOTE — Patient Instructions (Signed)
Medication Instructions:  ?Your physician recommends that you continue on your current medications as directed. Please refer to the Current Medication list given to you today.  ? ?*If you need a refill on your cardiac medications before your next appointment, please call your pharmacy* ? ? ?Lab Work: ?None ordered  ? ?If you have labs (blood work) drawn today and your tests are completely normal, you will receive your results only by: ?MyChart Message (if you have MyChart) OR ?A paper copy in the mail ?If you have any lab test that is abnormal or we need to change your treatment, we will call you to review the results. ? ? ?Testing/Procedures: ?None ordered  ? ? ?Follow-Up: ?At [facility], you and your health needs are our priority.  As part of our continuing mission to provide you with exceptional heart care, we have created designated Provider Care Teams.  These Care Teams include your primary Cardiologist (physician) and Advanced Practice Providers (APPs -  Physician Assistants and Nurse Practitioners) who all work together to provide you with the care you need, when you need it. ? ?We recommend signing up for the patient portal called "MyChart".  Sign up information is provided on this After Visit Summary.  MyChart is used to connect with patients for Virtual Visits (Telemedicine).  Patients are able to view lab/test results, encounter notes, upcoming appointments, etc.  Non-urgent messages can be sent to your provider as well.   ?To learn more about what you can do with MyChart, go to [email or web address].   ? ?Your next appointment:   ?12 month(s) ? ?The format for your next appointment:   ?In Person ? ?Provider:   ?None   ? ? ?Other Instructions ?None   ?

## 2021-08-06 DIAGNOSIS — H5711 Ocular pain, right eye: Secondary | ICD-10-CM | POA: Diagnosis not present

## 2021-08-06 DIAGNOSIS — H04123 Dry eye syndrome of bilateral lacrimal glands: Secondary | ICD-10-CM | POA: Diagnosis not present

## 2021-08-13 ENCOUNTER — Ambulatory Visit (INDEPENDENT_AMBULATORY_CARE_PROVIDER_SITE_OTHER): Payer: PPO | Admitting: Nurse Practitioner

## 2021-08-13 ENCOUNTER — Encounter: Payer: Self-pay | Admitting: Nurse Practitioner

## 2021-08-13 VITALS — BP 138/74 | HR 62 | Temp 97.1°F | Ht 62.0 in | Wt 171.0 lb

## 2021-08-13 DIAGNOSIS — I8500 Esophageal varices without bleeding: Secondary | ICD-10-CM | POA: Diagnosis not present

## 2021-08-13 DIAGNOSIS — M545 Low back pain, unspecified: Secondary | ICD-10-CM | POA: Diagnosis not present

## 2021-08-13 DIAGNOSIS — F331 Major depressive disorder, recurrent, moderate: Secondary | ICD-10-CM | POA: Diagnosis not present

## 2021-08-13 DIAGNOSIS — E669 Obesity, unspecified: Secondary | ICD-10-CM

## 2021-08-13 DIAGNOSIS — E782 Mixed hyperlipidemia: Secondary | ICD-10-CM | POA: Diagnosis not present

## 2021-08-13 DIAGNOSIS — J4520 Mild intermittent asthma, uncomplicated: Secondary | ICD-10-CM | POA: Diagnosis not present

## 2021-08-13 DIAGNOSIS — K219 Gastro-esophageal reflux disease without esophagitis: Secondary | ICD-10-CM

## 2021-08-13 DIAGNOSIS — M1990 Unspecified osteoarthritis, unspecified site: Secondary | ICD-10-CM | POA: Diagnosis not present

## 2021-08-13 DIAGNOSIS — I10 Essential (primary) hypertension: Secondary | ICD-10-CM

## 2021-08-13 DIAGNOSIS — G8929 Other chronic pain: Secondary | ICD-10-CM | POA: Diagnosis not present

## 2021-08-13 LAB — CBC WITH DIFFERENTIAL/PLATELET
Absolute Monocytes: 653 cells/uL (ref 200–950)
Basophils Absolute: 61 cells/uL (ref 0–200)
Basophils Relative: 0.9 %
Eosinophils Absolute: 163 cells/uL (ref 15–500)
Eosinophils Relative: 2.4 %
HCT: 41.9 % (ref 35.0–45.0)
Hemoglobin: 14.1 g/dL (ref 11.7–15.5)
Lymphs Abs: 3652 cells/uL (ref 850–3900)
MCH: 29.8 pg (ref 27.0–33.0)
MCHC: 33.7 g/dL (ref 32.0–36.0)
MCV: 88.6 fL (ref 80.0–100.0)
MPV: 10.3 fL (ref 7.5–12.5)
Monocytes Relative: 9.6 %
Neutro Abs: 2271 cells/uL (ref 1500–7800)
Neutrophils Relative %: 33.4 %
Platelets: 282 10*3/uL (ref 140–400)
RBC: 4.73 10*6/uL (ref 3.80–5.10)
RDW: 12.2 % (ref 11.0–15.0)
Total Lymphocyte: 53.7 %
WBC: 6.8 10*3/uL (ref 3.8–10.8)

## 2021-08-13 LAB — COMPLETE METABOLIC PANEL WITH GFR
AG Ratio: 1.8 (calc) (ref 1.0–2.5)
ALT: 15 U/L (ref 6–29)
AST: 22 U/L (ref 10–35)
Albumin: 4.3 g/dL (ref 3.6–5.1)
Alkaline phosphatase (APISO): 91 U/L (ref 37–153)
BUN: 21 mg/dL (ref 7–25)
CO2: 31 mmol/L (ref 20–32)
Calcium: 9.8 mg/dL (ref 8.6–10.4)
Chloride: 106 mmol/L (ref 98–110)
Creat: 0.69 mg/dL (ref 0.60–0.95)
Globulin: 2.4 g/dL (calc) (ref 1.9–3.7)
Glucose, Bld: 103 mg/dL — ABNORMAL HIGH (ref 65–99)
Potassium: 5.2 mmol/L (ref 3.5–5.3)
Sodium: 142 mmol/L (ref 135–146)
Total Bilirubin: 0.8 mg/dL (ref 0.2–1.2)
Total Protein: 6.7 g/dL (ref 6.1–8.1)
eGFR: 87 mL/min/{1.73_m2} (ref >=60)

## 2021-08-13 MED ORDER — PROPRANOLOL HCL 10 MG PO TABS: 10.0000 mg | ORAL_TABLET | Freq: Two times a day (BID) | ORAL | 11 refills | Status: DC

## 2021-08-13 NOTE — Progress Notes (Signed)
? ? ?Careteam: ?Patient Care Team: ?Lauree Chandler, NP as PCP - General (Nurse Practitioner) ? ?PLACE OF SERVICE:  ?[facility]  ?Advanced Directive information ?Does Patient Have a Medical Advance Directive?: Yes, Type of Advance Directive: Out of facility DNR (pink MOST or yellow form), Pre-existing out of facility DNR order (yellow form or pink MOST form): Yellow form placed in chart (order not valid for inpatient use);Pink MOST form placed in chart (order not valid for inpatient use), Does patient want to make changes to medical advance directive?: No - Patient declined ? ?Allergies  ?Allergen Reactions  ? Ace Inhibitors   ?  cough  ? Amoxicillin Itching  ?  REACTION: unknown? pain in right kidney  ? Benicar Hct [Olmesartan Medoxomil-Hctz]   ?  Extreme weakness  ? Budesonide-Formoterol Fumarate   ?  Causes tremors and numbness  ? Chlorzoxazone Hives  ? Chlorzoxazone [Chlorzoxazone]   ? Citalopram Hydrobromide   ?  REACTION: hives  ? Citalopram Hydrobromide   ? Clonidine Derivatives   ? Cymbalta [Duloxetine Hcl]   ? Droperidol   ?  REACTION: hives  ? Flexeril [Cyclobenzaprine Hcl]   ? Fluconazole Nausea And Vomiting and Other (See Comments)  ?  fatigue  ? Formoterol Itching  ? Gabapentin Itching  ? Ketorolac Tromethamine   ?  REACTION: hives  ? Ketorolac Tromethamine   ? Metoclopramide Hcl   ? Mometasone Furo-Formoterol Fum   ?  Causes sore throat, blurred vision and unable to sleep--started on med 09-17-10  ? Mometasone Furoate Itching  ? Morphine   ?  REACTION: hives and itching  ? Olmesartan Medoxomil   ?  REACTION: fatique  ? Breo Ellipta [Fluticasone Furoate-Vilanterol] Itching  ?  Pt reports itching with [place] is related to being lactose intolerant.   ? ? ?Chief Complaint  ?Patient presents with  ? Medical Management of Chronic Issues  ?  3 month follow-up. Discuss need for colonoscopy and covid booster (refused) or post pone if patient refuses. Need an alternative to [place] [employer] will not cover).  Fasting for labs. Patient c/o lower bilateral leg redness off/on. Patient c/o bladder concerns (incontinence)   ? ? ? ?HPI: Patient is a 81 y.o. female for routine follow up.  ? ?Reports she is having severe back and knee pain.  ?She has had shots in both knees and both side of spine. It has been 3 months and she needs another injection.  ? ?Her advair is no longer covered, she is not sure what they will cover insurance gave her alternative but she lost paper- she plans to call. She is using her albuterol instead at this time. Shortness of breath slightly worse with this. No increase in cough or congestion.  ? ?No longer getting colonoscopy due to age and co-morbidies.  ? ?She has gained weight- 39 lbs.  ? ?No longer driving, she is trying to fill her life with something. Has a lot of pain.  ? ?No abdominal swelling or pain, blood in stool.  ? ? ?Review of Systems:  ?Review of Systems  ?Constitutional:  Negative for chills, fever and weight loss.  ?HENT:  Negative for tinnitus.   ?Respiratory:  Negative for cough, sputum production and shortness of breath.   ?Cardiovascular:  Negative for chest pain, palpitations and leg swelling.  ?Gastrointestinal:  Negative for abdominal pain, constipation, diarrhea and heartburn.  ?Genitourinary:  Negative for dysuria, frequency and urgency.  ?Musculoskeletal:  Positive for back pain, joint pain and myalgias. Negative  for falls.  ?Skin: Negative.   ?Neurological:  Negative for dizziness and headaches.  ?Psychiatric/Behavioral:  Negative for depression and memory loss. The patient does not have insomnia.   ? ?Past Medical History:  ?Diagnosis Date  ? ALLERGIC RHINITIS   ? Anxiety   ? Asthma   ? Blood type O+   ? Cervical strain, acute   ? Cirrhosis ([place])   ? Colon polyp 2010  ? adenoma  ? Diverticulosis of colon   ? Dizziness   ? Esophageal varices ([facility])   ? Fibromyalgia   ? GERD (gastroesophageal reflux disease)   ? History of nephrolithiasis   ? Hypercholesterolemia   ?  borderline  ? Hypertension   ? IBS (irritable bowel syndrome)   ? Osteoarthritis   ? Personal history of allergy to unspecified medicinal agent   ? Postconcussion syndrome   ? Vitamin D deficiency   ? ?Past Surgical History:  ?Procedure Laterality Date  ? CHOLECYSTECTOMY, LAPAROSCOPIC  2004  ? for gallstone pancreatitis  ? KNEE SURGERY Right   ? KNEE SURGERY Left   ? THYROID SURGERY    ? cyst removed  ? ?Social History: ?  reports that she quit smoking about 59 years ago. Her smoking use included cigarettes. She has a 16.00 pack-year smoking history. She has never used smokeless tobacco. She reports that she does not drink alcohol and does not use drugs. ? ?Family History  ?Problem Relation Age of Onset  ? Breast cancer Mother   ? Alzheimer's disease Mother   ? COPD Brother   ? Alcohol abuse Brother   ? Heart disease Sister   ? Breast cancer Sister 33  ? Alcohol abuse Sister   ? Ovarian cancer Paternal Grandmother   ? Arthritis Other   ?     Cousins   ? COPD Cousin   ?     Maternal side   ? Heart attack Maternal Uncle   ? Colon cancer Neg Hx   ? Esophageal cancer Neg Hx   ? ? ?Medications: ?Patient's Medications  ?New Prescriptions  ? No medications on file  ?Previous Medications  ? ACETAMINOPHEN (TYLENOL) 500 MG TABLET    Take 1,000 mg by mouth 2 (two) times a day.  ? ALBUTEROL (PROAIR HFA) 108 (90 BASE) MCG/ACT INHALER    INHALE 2 PUFFS EVERY 4 HOURS AS NEEDED INTO THE LUNGS FOR WHEEZING OR SHORTNESS OF BREATH J45.20  ? AMLODIPINE (NORVASC) 5 MG TABLET    Take 1 tablet (5 mg total) by mouth daily. I10  ? APIXABAN (ELIQUIS) 2.5 MG TABS TABLET    Take 1 tablet (2.5 mg total) by mouth 2 (two) times daily.  ? CETIRIZINE (ZYRTEC) 10 MG TABLET    Take 10 mg by mouth daily.  ? CHOLECALCIFEROL 50 MCG (2000 UT) CAPS    Take 2,000 Units by mouth daily.  ? ESOMEPRAZOLE (NEXIUM) 20 MG CAPSULE    TAKE 1 CAPSULE BY MOUTH EVERY DAY  ? GABAPENTIN (NEURONTIN) 100 MG CAPSULE    Take 3 mg by mouth at bedtime.  ? LOSARTAN (COZAAR) 25  MG TABLET    TAKE 1 TABLET BY MOUTH EVERY DAY  ? OMEGA-3 FATTY ACIDS (FISH OIL MAXIMUM STRENGTH) 1200 MG CAPS    Take 1,200 mg by mouth 2 (two) times daily.   ? PROPRANOLOL (INDERAL) 10 MG TABLET    TAKE 1 TABLET BY MOUTH TWICE A DAY  ? PROPYLENE GLYCOL (SYSTANE BALANCE) 0.6 % SOLN    Place  2 drops into both eyes 2 (two) times daily.  ? SODIUM CHLORIDE (OCEAN) 0.65 % NASAL SPRAY    Place 1 spray into the nose daily.  ?Modified Medications  ? No medications on file  ?Discontinued Medications  ? ADVAIR HFA 115-21 MCG/ACT INHALER    TAKE 2 PUFFS BY MOUTH TWICE A DAY  ? PREDNISONE (DELTASONE) 20 MG TABLET    Take 1 tablet (20 mg total) by mouth 2 (two) times daily with a meal.  ? ? ?Physical Exam: ? ?Vitals:  ? 08/13/21 0913  ?BP: 138/74  ?Pulse: 62  ?Temp: (!) 97.1 ?F (36.2 ?C)  ?TempSrc: Temporal  ?SpO2: 96%  ?Weight: 171 lb (77.6 kg)  ?Height: 5' 2"  (1.575 m)  ? ?Body mass index is 31.28 kg/m?. ?Wt Readings from Last 3 Encounters:  ?08/13/21 171 lb (77.6 kg)  ?07/23/21 170 lb (77.1 kg)  ?05/11/21 167 lb (75.8 kg)  ? ? ?Physical Exam ?Constitutional:   ?   General: She is not in acute distress. ?   Appearance: She is well-developed. She is not diaphoretic.  ?HENT:  ?   Head: Normocephalic and atraumatic.  ?   Mouth/Throat:  ?   Pharynx: No oropharyngeal exudate.  ?Eyes:  ?   Conjunctiva/sclera: Conjunctivae normal.  ?   Pupils: Pupils are equal, round, and reactive to light.  ?Cardiovascular:  ?   Rate and Rhythm: Normal rate and regular rhythm.  ?   Heart sounds: Normal heart sounds.  ?Pulmonary:  ?   Effort: Pulmonary effort is normal.  ?   Breath sounds: Normal breath sounds.  ?Abdominal:  ?   General: Bowel sounds are normal.  ?   Palpations: Abdomen is soft.  ?Musculoskeletal:  ?   Cervical back: Normal range of motion and neck supple.  ?   Right lower leg: No edema.  ?   Left lower leg: No edema.  ?Skin: ?   General: Skin is warm and dry.  ?Neurological:  ?   Mental Status: She is alert.  ?Psychiatric:     ?    Mood and Affect: Mood normal.  ? ? ?Labs reviewed: ?Basic Metabolic Panel: ?Recent Labs  ?  11/06/20 ?0943 05/08/21 ?0921  ?NA 139 139  ?K 4.5 4.1  ?CL 103 101  ?CO2 26 29  ?GLUCOSE 101* 92  ?BUN 25 17  ?CREATI

## 2021-08-14 ENCOUNTER — Other Ambulatory Visit: Payer: Self-pay | Admitting: Nurse Practitioner

## 2021-08-14 MED ORDER — FLUTICASONE PROPIONATE HFA 110 MCG/ACT IN AERO: 1.0000 | INHALATION_SPRAY | Freq: Two times a day (BID) | RESPIRATORY_TRACT | 12 refills | Status: DC

## 2021-08-16 DIAGNOSIS — Z7901 Long term (current) use of anticoagulants: Secondary | ICD-10-CM | POA: Diagnosis not present

## 2021-08-16 DIAGNOSIS — H04121 Dry eye syndrome of right lacrimal gland: Secondary | ICD-10-CM | POA: Diagnosis not present

## 2021-08-16 DIAGNOSIS — I10 Essential (primary) hypertension: Secondary | ICD-10-CM | POA: Diagnosis not present

## 2021-08-16 DIAGNOSIS — Z8781 Personal history of (healed) traumatic fracture: Secondary | ICD-10-CM | POA: Diagnosis not present

## 2021-08-16 DIAGNOSIS — I480 Paroxysmal atrial fibrillation: Secondary | ICD-10-CM | POA: Diagnosis not present

## 2021-08-27 DIAGNOSIS — M533 Sacrococcygeal disorders, not elsewhere classified: Secondary | ICD-10-CM | POA: Diagnosis not present

## 2021-09-10 DIAGNOSIS — M170 Bilateral primary osteoarthritis of knee: Secondary | ICD-10-CM | POA: Diagnosis not present

## 2021-09-20 ENCOUNTER — Other Ambulatory Visit: Payer: Self-pay | Admitting: Nurse Practitioner

## 2021-09-20 DIAGNOSIS — I10 Essential (primary) hypertension: Secondary | ICD-10-CM

## 2021-09-28 ENCOUNTER — Other Ambulatory Visit: Payer: Self-pay

## 2021-09-28 ENCOUNTER — Emergency Department (HOSPITAL_COMMUNITY): Payer: PPO

## 2021-09-28 ENCOUNTER — Emergency Department (HOSPITAL_COMMUNITY)
Admission: EM | Admit: 2021-09-28 | Discharge: 2021-09-28 | Disposition: A | Discharge status: Home / Self Care | Payer: PPO | Attending: Emergency Medicine | Admitting: Emergency Medicine

## 2021-09-28 ENCOUNTER — Encounter (HOSPITAL_COMMUNITY): Payer: Self-pay | Admitting: *Deleted

## 2021-09-28 DIAGNOSIS — Z7901 Long term (current) use of anticoagulants: Secondary | ICD-10-CM | POA: Diagnosis not present

## 2021-09-28 DIAGNOSIS — I10 Essential (primary) hypertension: Secondary | ICD-10-CM | POA: Diagnosis not present

## 2021-09-28 DIAGNOSIS — R0781 Pleurodynia: Secondary | ICD-10-CM | POA: Diagnosis not present

## 2021-09-28 DIAGNOSIS — R0789 Other chest pain: Secondary | ICD-10-CM

## 2021-09-28 DIAGNOSIS — R079 Chest pain, unspecified: Secondary | ICD-10-CM | POA: Diagnosis not present

## 2021-09-28 LAB — CBC WITH DIFFERENTIAL/PLATELET
Abs Immature Granulocytes: 0.02 10*3/uL (ref 0.00–0.07)
Basophils Absolute: 0 10*3/uL (ref 0.0–0.1)
Basophils Relative: 1 %
Eosinophils Absolute: 0.1 10*3/uL (ref 0.0–0.5)
Eosinophils Relative: 1 %
HCT: 42.5 % (ref 36.0–46.0)
Hemoglobin: 14.2 g/dL (ref 12.0–15.0)
Immature Granulocytes: 0 %
Lymphocytes Relative: 43 %
Lymphs Abs: 3.1 10*3/uL (ref 0.7–4.0)
MCH: 30.2 pg (ref 26.0–34.0)
MCHC: 33.4 g/dL (ref 30.0–36.0)
MCV: 90.4 fL (ref 80.0–100.0)
Monocytes Absolute: 0.7 10*3/uL (ref 0.1–1.0)
Monocytes Relative: 10 %
Neutro Abs: 3.2 10*3/uL (ref 1.7–7.7)
Neutrophils Relative %: 45 %
Platelets: 248 10*3/uL (ref 150–400)
RBC: 4.7 MIL/uL (ref 3.87–5.11)
RDW: 12.5 % (ref 11.5–15.5)
WBC: 7.1 10*3/uL (ref 4.0–10.5)
nRBC: 0 % (ref 0.0–0.2)

## 2021-09-28 LAB — BASIC METABOLIC PANEL
Anion gap: 7 (ref 5–15)
BUN: 16 mg/dL (ref 8–23)
CO2: 26 mmol/L (ref 22–32)
Calcium: 9.5 mg/dL (ref 8.9–10.3)
Chloride: 106 mmol/L (ref 98–111)
Creatinine, Ser: 0.66 mg/dL (ref 0.44–1.00)
GFR, Estimated: >60 mL/min (ref >60)
Glucose, Bld: 110 mg/dL — ABNORMAL HIGH (ref 70–99)
Potassium: 4.5 mmol/L (ref 3.5–5.1)
Sodium: 139 mmol/L (ref 135–145)

## 2021-09-28 LAB — TROPONIN I (HIGH SENSITIVITY)
Troponin I (High Sensitivity): 5 ng/L (ref <18)
Troponin I (High Sensitivity): 6 ng/L (ref <18)

## 2021-09-28 NOTE — ED Provider Notes (Signed)
[facility] EMERGENCY DEPARTMENT Provider Note   CSN: [id number] Arrival date & time: 09/28/21  5456     History  Chief Complaint  Patient presents with   Chest Pain    [name] is a 81 y.o. female.  81 year old female with prior medical history as detailed below presents for evaluation.  Patient reports right-sided lateral chest wall discomfort.  This began yesterday evening.  She describes sharp tingling discomfort to the right lateral chest wall.  This morning the pain was more on the left lateral chest wall.  The patient's initial pain on the right has mostly resolved.  She denies any associated shortness of breath.  She denies any anterior or substernal chest pain.  She denies any back pain.  She denies associated nausea or vomiting.  She is alert, oriented, and comfortable at time of eval in the ED.  She is smiling and laughing and talking to staff in no apparent discomfort.  She denies any specific inciting event.  Takes Tylenol twice a day for management of chronic arthralgic pain.  She denies any recent fever or cough.  She denies any rash to her thorax or other areas.  The history is provided by the patient and medical records.  Chest Pain Pain location:  L lateral chest and R lateral chest Pain quality: sharp   Pain radiates to:  Does not radiate Pain severity:  Mild Onset quality:  Gradual Duration:  1 day Timing:  Constant Progression:  Waxing and waning Chronicity:  New     Home Medications Prior to Admission medications   Medication Sig Start Date End Date Taking? Authorizing Provider  acetaminophen (TYLENOL) 500 MG tablet Take 1,000 mg by mouth 2 (two) times a day.    [provider]  albuterol (PROAIR HFA) 108 (90 Base) MCG/ACT inhaler INHALE 2 PUFFS EVERY 4 HOURS AS NEEDED INTO THE LUNGS FOR WHEEZING OR SHORTNESS OF BREATH J45.20 04/11/20   Lauree Chandler, NP  amLODipine (NORVASC) 5 MG tablet TAKE 1 TABLET (5 MG TOTAL)  BY MOUTH DAILY. I10 09/20/21   Lauree Chandler, NP  apixaban (ELIQUIS) 2.5 MG TABS tablet Take 1 tablet (2.5 mg total) by mouth 2 (two) times daily. 07/23/21   Burnell Blanks, MD  cetirizine (ZYRTEC) 10 MG tablet Take 10 mg by mouth daily.    [provider]  Cholecalciferol 50 MCG (2000 UT) CAPS Take 2,000 Units by mouth daily.    [provider]  esomeprazole (NEXIUM) 20 MG capsule TAKE 1 CAPSULE BY MOUTH EVERY DAY 01/23/21   Armbruster, Carlota Raspberry, MD  fluticasone (FLOVENT HFA) 110 MCG/ACT inhaler Inhale 1-2 puffs into the lungs every 12 (twelve) hours. 08/14/21   Lauree Chandler, NP  gabapentin (NEURONTIN) 100 MG capsule Take 3 mg by mouth at bedtime.    [provider]  losartan (COZAAR) 25 MG tablet TAKE 1 TABLET BY MOUTH EVERY DAY 03/26/21   Lauree Chandler, NP  Omega-3 Fatty Acids (FISH OIL MAXIMUM STRENGTH) 1200 MG CAPS Take 1,200 mg by mouth 2 (two) times daily.     [provider]  propranolol (INDERAL) 10 MG tablet Take 1 tablet (10 mg total) by mouth 2 (two) times daily. 08/13/21   Lauree Chandler, NP  Propylene Glycol (SYSTANE BALANCE) 0.6 % SOLN Place 2 drops into both eyes 2 (two) times daily. 07/10/20   Lauree Chandler, NP  sodium chloride (OCEAN) 0.65 % nasal spray Place 1 spray into the  nose daily.    [provider]      Allergies    Ace inhibitors, Amoxicillin, Benicar hct [olmesartan medoxomil-hctz], Budesonide-formoterol fumarate, Chlorzoxazone, Chlorzoxazone [chlorzoxazone], Citalopram hydrobromide, Citalopram hydrobromide, Clonidine derivatives, Cymbalta [duloxetine hcl], Droperidol, Flexeril [cyclobenzaprine hcl], Fluconazole, Formoterol, Gabapentin, Ketorolac tromethamine, Ketorolac tromethamine, Metoclopramide hcl, Mometasone furo-formoterol fum, Mometasone furoate, Morphine, Olmesartan medoxomil, and Breo ellipta [fluticasone furoate-vilanterol]    Review of Systems   Review of Systems  Cardiovascular:   Positive for chest pain.  All other systems reviewed and are negative.  Physical Exam Updated Vital Signs BP (!) 188/79 (BP Location: Right Arm)   Pulse 68   Temp 98.1 F (36.7 C) (Oral)   Resp 18   Ht 5' 2"  (1.575 m)   Wt 77.6 kg   SpO2 99%   BMI 31.29 kg/m  Physical Exam Vitals and nursing note reviewed.  Constitutional:      General: She is not in acute distress.    Appearance: Normal appearance. She is well-developed.  HENT:     Head: Normocephalic and atraumatic.  Eyes:     Conjunctiva/sclera: Conjunctivae normal.     Pupils: Pupils are equal, round, and reactive to light.  Cardiovascular:     Rate and Rhythm: Normal rate and regular rhythm.     Heart sounds: Normal heart sounds.  Pulmonary:     Effort: Pulmonary effort is normal. No respiratory distress.     Breath sounds: Normal breath sounds.  Abdominal:     General: There is no distension.     Palpations: Abdomen is soft.     Tenderness: There is no abdominal tenderness.  Musculoskeletal:        General: No deformity. Normal range of motion.     Cervical back: Normal range of motion and neck supple.  Skin:    General: Skin is warm and dry.     Comments: No visible rash to trunk -specifically, no lesions consistent with possible early shingles.   Neurological:     General: No focal deficit present.     Mental Status: She is alert and oriented to person, place, and time.    ED Results / Procedures / Treatments   Labs (all labs ordered are listed, but only abnormal results are displayed) Labs Reviewed  BASIC METABOLIC PANEL  CBC WITH DIFFERENTIAL/PLATELET  TROPONIN I (HIGH SENSITIVITY)    EKG EKG Interpretation  Date/Time:  Friday Sep 28 2021 08:34:57 EDT Ventricular Rate:  66 PR Interval:  180 QRS Duration: 89 QT Interval:  381 QTC Calculation: 400 R Axis:   57 Text Interpretation: Sinus rhythm Consider left ventricular hypertrophy Confirmed by Dene Gentry [phone]) on 09/28/2021 8:37:11  AM  Radiology No results found.  Procedures Procedures    Medications Ordered in ED Medications - No data to display  ED Course/ Medical Decision Making/ A&P                           Medical Decision Making Amount and/or Complexity of Data Reviewed Labs: ordered. Radiology: ordered.    Medical Screen Complete  This patient presented to the ED with complaint of chest pain.  This complaint involves an extensive number of treatment options. The initial differential diagnosis includes, but is not limited to, ACS, pulmonary pathology, metabolic abnormality, etc.  This presentation is: Acute, Self-Limited, Previously Undiagnosed, Uncertain Prognosis, Complicated, Systemic Symptoms, and Threat to Life/Bodily Function  Patient presents with atypical right and left-sided lateral chest discomfort.  Patient without complaint of substernal chest discomfort or back pain.  Patient without associated symptoms.  Patient feels improved during ED evaluation.  EKG is without acute abnormality.  Troponin x2 is minimally detectable (6,6).  Patient feels improved after ED evaluation  Other screening labs obtained are without significant abnormality.  Patient desires discharge home.  Importance of close follow-up stressed.  Strict return precautions given and understood.   Additional history obtained:  Additional history obtained from EMS External records from outside sources obtained and reviewed including prior ED visits and prior Inpatient records.    Lab Tests:  I ordered and personally interpreted labs.  The pertinent results include: CBC, BMP, troponin   Imaging Studies ordered:  I ordered imaging studies including chest x-ray I independently visualized and interpreted obtained imaging which showed NAD I agree with the radiologist interpretation.   Cardiac Monitoring:  The patient was maintained on a cardiac monitor.  I personally viewed and interpreted the cardiac  monitor which showed an underlying rhythm of: NSR  Problem List / ED Course:  Atypical chest discomfort   Reevaluation:  After the interventions noted above, I reevaluated the patient and found that they have: improved   Disposition:  After consideration of the diagnostic results and the patients response to treatment, I feel that the patent would benefit from close outpatient follow-up.          Final Clinical Impression(s) / ED Diagnoses Final diagnoses:  Atypical chest pain    Rx / DC Orders ED Discharge Orders     None         Valarie Merino, MD 09/28/21 1200

## 2021-09-28 NOTE — Discharge Instructions (Signed)
Return for any problem.  ?

## 2021-09-28 NOTE — ED Triage Notes (Signed)
Patient presents to ed via [facility]  c/o pain in her left lateral chest states the pain is sharp in nature and states pain is now in her right lateral chest today, normally uses a cane to ambulate. States she took tylenol last pm didn't really help. Patient is alert oriented.

## 2021-10-01 DIAGNOSIS — I480 Paroxysmal atrial fibrillation: Secondary | ICD-10-CM | POA: Diagnosis not present

## 2021-10-01 DIAGNOSIS — Z515 Encounter for palliative care: Secondary | ICD-10-CM | POA: Diagnosis not present

## 2021-10-01 DIAGNOSIS — I10 Essential (primary) hypertension: Secondary | ICD-10-CM | POA: Diagnosis not present

## 2021-10-01 DIAGNOSIS — Z7901 Long term (current) use of anticoagulants: Secondary | ICD-10-CM | POA: Diagnosis not present

## 2021-10-01 DIAGNOSIS — K7460 Unspecified cirrhosis of liver: Secondary | ICD-10-CM | POA: Diagnosis not present

## 2021-10-01 DIAGNOSIS — J42 Unspecified chronic bronchitis: Secondary | ICD-10-CM | POA: Diagnosis not present

## 2021-10-01 DIAGNOSIS — Z8679 Personal history of other diseases of the circulatory system: Secondary | ICD-10-CM | POA: Diagnosis not present

## 2021-10-01 DIAGNOSIS — D692 Other nonthrombocytopenic purpura: Secondary | ICD-10-CM | POA: Diagnosis not present

## 2021-10-19 ENCOUNTER — Telehealth: Payer: Self-pay

## 2021-10-19 NOTE — Telephone Encounter (Signed)
She will need an in office visit for PT in the home. Please have her schedule in office appt

## 2021-10-19 NOTE — Telephone Encounter (Signed)
Called patient and unable to leave voicemail.

## 2021-10-19 NOTE — Telephone Encounter (Signed)
Kelly from [employer] called and states that she needs for PCP to write order for patient to have toilet riser, and physical therapy in home. She states that patient has had several falls in the past week. She states that we need to fax to [place] and have patient/poa come and pick order up in office. Then give her a call and let her know it's been taken care of. Message routed to PCP Dewaine Oats, Carlos American, NP

## 2021-10-22 NOTE — Telephone Encounter (Signed)
Patient called back. Declined $45 co pay/office visit as she states she can not afford it at this time. She is walking in her apartment and in the middle of moving from 3rd floor to the 1st floor.

## 2021-10-22 NOTE — Telephone Encounter (Signed)
Called patient again today and still unable to leave voicemail.

## 2021-10-31 NOTE — Telephone Encounter (Signed)
Noted! Thank you

## 2021-10-31 NOTE — Telephone Encounter (Signed)
Message routed back to PCP Dewaine Oats, Carlos American, NP

## 2021-11-11 ENCOUNTER — Other Ambulatory Visit: Payer: Self-pay | Admitting: Gastroenterology

## 2021-11-11 DIAGNOSIS — K219 Gastro-esophageal reflux disease without esophagitis: Secondary | ICD-10-CM

## 2021-11-16 ENCOUNTER — Ambulatory Visit (INDEPENDENT_AMBULATORY_CARE_PROVIDER_SITE_OTHER): Payer: PPO | Admitting: Nurse Practitioner

## 2021-11-16 ENCOUNTER — Encounter: Payer: Self-pay | Admitting: Nurse Practitioner

## 2021-11-16 VITALS — BP 122/68 | HR 62 | Temp 97.1°F | Resp 20 | Ht 62.0 in | Wt 175.4 lb

## 2021-11-16 DIAGNOSIS — J4520 Mild intermittent asthma, uncomplicated: Secondary | ICD-10-CM

## 2021-11-16 DIAGNOSIS — G629 Polyneuropathy, unspecified: Secondary | ICD-10-CM | POA: Diagnosis not present

## 2021-11-16 DIAGNOSIS — K219 Gastro-esophageal reflux disease without esophagitis: Secondary | ICD-10-CM | POA: Diagnosis not present

## 2021-11-16 DIAGNOSIS — G8929 Other chronic pain: Secondary | ICD-10-CM

## 2021-11-16 DIAGNOSIS — M545 Low back pain, unspecified: Secondary | ICD-10-CM | POA: Diagnosis not present

## 2021-11-16 DIAGNOSIS — E782 Mixed hyperlipidemia: Secondary | ICD-10-CM

## 2021-11-16 DIAGNOSIS — E669 Obesity, unspecified: Secondary | ICD-10-CM

## 2021-11-16 DIAGNOSIS — I10 Essential (primary) hypertension: Secondary | ICD-10-CM | POA: Diagnosis not present

## 2021-11-16 DIAGNOSIS — J309 Allergic rhinitis, unspecified: Secondary | ICD-10-CM

## 2021-11-16 DIAGNOSIS — M1990 Unspecified osteoarthritis, unspecified site: Secondary | ICD-10-CM

## 2021-11-16 MED ORDER — EZETIMIBE 10 MG PO TABS: 10.0000 mg | ORAL_TABLET | Freq: Every day | ORAL | 3 refills | Status: DC

## 2021-11-16 NOTE — Progress Notes (Signed)
Careteam: Patient Care Team: Lauree Chandler, NP as PCP - General (Nurse Practitioner)  PLACE OF SERVICE:  [facility]  Advanced Directive information    Allergies  Allergen Reactions   Ace Inhibitors     cough   Amoxicillin Itching    REACTION: unknown? pain in right kidney   Benicar Hct [Olmesartan Medoxomil-Hctz]     Extreme weakness   Budesonide-Formoterol Fumarate     Causes tremors and numbness   Chlorzoxazone Hives   Chlorzoxazone [Chlorzoxazone]    Citalopram Hydrobromide     REACTION: hives   Citalopram Hydrobromide    Clonidine Derivatives    Cymbalta [Duloxetine Hcl]    Droperidol     REACTION: hives   Flexeril [Cyclobenzaprine Hcl]    Fluconazole Nausea And Vomiting and Other (See Comments)    fatigue   Formoterol Itching   Gabapentin Itching   Ketorolac Tromethamine     REACTION: hives   Ketorolac Tromethamine    Metoclopramide Hcl    Mometasone Furo-Formoterol Fum     Causes sore throat, blurred vision and unable to sleep--started on med 09-17-10   Mometasone Furoate Itching   Morphine     REACTION: hives and itching   Olmesartan Medoxomil     REACTION: fatique   Breo Ellipta [Fluticasone Furoate-Vilanterol] Itching    Pt reports itching with Memory Dance is related to being lactose intolerant.     Chief Complaint  Patient presents with   Medical Management of Chronic Issues    Patient presents today for a 3 month follow-up.   Quality Metric Gaps    COVID booster#5     HPI: Patient is a 81 y.o. female here for routine follow up.  Reports mobility and pain is worse. She is [place] and orthopedic.  She reports low back pain and bilateral knee pain.  Harder for her to move around. She now has a first floor apartment and that is making a difference with getting in and out.  She is now having to move slow to avoid falls. Using walker to get around. No falls.   Asthma/allergies are doing much better. Continues on flovent. No recent bronchitis.    She is using food as a pleasure, continues to have a poor diet.   Reports a lot of memory issues.  Sometimes she wonders if she ate breakfast.  Continues to keep her on bills. She has a son here in town.   Moving bowels well.   No worsening GERD  Review of Systems:  Review of Systems  Constitutional:  Negative for chills, fever and weight loss.  HENT:  Negative for tinnitus.   Respiratory:  Negative for cough, sputum production and shortness of breath.   Cardiovascular:  Negative for chest pain, palpitations and leg swelling.  Gastrointestinal:  Negative for abdominal pain, constipation, diarrhea and heartburn.  Genitourinary:  Negative for dysuria, frequency and urgency.  Musculoskeletal:  Positive for back pain, joint pain and myalgias. Negative for falls.  Skin: Negative.   Neurological:  Negative for dizziness and headaches.  Psychiatric/Behavioral:  Negative for depression and memory loss. The patient does not have insomnia.     Past Medical History:  Diagnosis Date   ALLERGIC RHINITIS    Anxiety    Asthma    Blood type O+    Cervical strain, acute    Cirrhosis ([place])    Colon polyp 2010   adenoma   Diverticulosis of colon    Dizziness    Esophageal varices (  [place])    Fibromyalgia    GERD (gastroesophageal reflux disease)    History of nephrolithiasis    Hypercholesterolemia    borderline   Hypertension    IBS (irritable bowel syndrome)    Osteoarthritis    Personal history of allergy to unspecified medicinal agent    Postconcussion syndrome    Vitamin D deficiency    Past Surgical History:  Procedure Laterality Date   CHOLECYSTECTOMY, LAPAROSCOPIC  2004   for gallstone pancreatitis   KNEE SURGERY Right    KNEE SURGERY Left    THYROID SURGERY     cyst removed   Social History:   reports that she quit smoking about 59 years ago. Her smoking use included cigarettes. She has a 16.00 pack-year smoking history. She has never used smokeless tobacco. She  reports that she does not drink alcohol and does not use drugs.  Family History  Problem Relation Age of Onset   Breast cancer Mother    Alzheimer's disease Mother    Ovarian cancer Sister    Heart disease Sister    Breast cancer Sister 73   Alcohol abuse Sister    COPD Brother    Alcohol abuse Brother    Ovarian cancer Paternal Grandmother    Heart attack Maternal Uncle    COPD Cousin        Maternal side    Arthritis Other        Cousins    Colon cancer Neg Hx    Esophageal cancer Neg Hx     Medications: Patient's Medications  New Prescriptions   No medications on file  Previous Medications   ACETAMINOPHEN (TYLENOL) 500 MG TABLET    Take 1,000 mg by mouth 2 (two) times a day.   ALBUTEROL (PROAIR HFA) 108 (90 BASE) MCG/ACT INHALER    INHALE 2 PUFFS EVERY 4 HOURS AS NEEDED INTO THE LUNGS FOR WHEEZING OR SHORTNESS OF BREATH J45.20   AMLODIPINE (NORVASC) 5 MG TABLET    TAKE 1 TABLET (5 MG TOTAL) BY MOUTH DAILY. I10   APIXABAN (ELIQUIS) 2.5 MG TABS TABLET    Take 1 tablet (2.5 mg total) by mouth 2 (two) times daily.   CETIRIZINE (ZYRTEC) 10 MG TABLET    Take 10 mg by mouth daily.   CHOLECALCIFEROL 50 MCG (2000 UT) CAPS    Take 2,000 Units by mouth daily.   ESOMEPRAZOLE (NEXIUM) 20 MG CAPSULE    TAKE 1 CAPSULE BY MOUTH EVERY DAY   FLUTICASONE (FLOVENT HFA) 110 MCG/ACT INHALER    Inhale 1-2 puffs into the lungs every 12 (twelve) hours.   GABAPENTIN (NEURONTIN) 100 MG CAPSULE    Take 3 mg by mouth at bedtime.   LOSARTAN (COZAAR) 25 MG TABLET    TAKE 1 TABLET BY MOUTH EVERY DAY   OMEGA-3 FATTY ACIDS (FISH OIL MAXIMUM STRENGTH) 1200 MG CAPS    Take 1,200 mg by mouth 2 (two) times daily.    PROPRANOLOL (INDERAL) 10 MG TABLET    Take 1 tablet (10 mg total) by mouth 2 (two) times daily.   PROPYLENE GLYCOL (SYSTANE BALANCE) 0.6 % SOLN    Place 2 drops into both eyes 2 (two) times daily.   SODIUM CHLORIDE (OCEAN) 0.65 % NASAL SPRAY    Place 1 spray into the nose daily.  Modified  Medications   No medications on file  Discontinued Medications   No medications on file    Physical Exam:  Vitals:   11/16/21 0819  BP:  122/68  Pulse: 62  Resp: 20  Temp: (!) 97.1 F (36.2 C)  SpO2: 97%  Weight: 175 lb 6.4 oz (79.6 kg)  Height: 5' 2"  (1.575 m)   Body mass index is 32.08 kg/m. Wt Readings from Last 3 Encounters:  11/16/21 175 lb 6.4 oz (79.6 kg)  09/28/21 171 lb 1.2 oz (77.6 kg)  08/13/21 171 lb (77.6 kg)    Physical Exam Constitutional:      General: She is not in acute distress.    Appearance: She is well-developed. She is not diaphoretic.  HENT:     Head: Normocephalic and atraumatic.     Mouth/Throat:     Pharynx: No oropharyngeal exudate.  Eyes:     Conjunctiva/sclera: Conjunctivae normal.     Pupils: Pupils are equal, round, and reactive to light.  Cardiovascular:     Rate and Rhythm: Normal rate and regular rhythm.     Heart sounds: Normal heart sounds.  Pulmonary:     Effort: Pulmonary effort is normal.     Breath sounds: Normal breath sounds.  Abdominal:     General: Bowel sounds are normal.     Palpations: Abdomen is soft.  Musculoskeletal:     Cervical back: Normal range of motion and neck supple.     Right lower leg: No edema.     Left lower leg: No edema.  Skin:    General: Skin is warm and dry.  Neurological:     Mental Status: She is alert and oriented to person, place, and time. Mental status is at baseline.  Psychiatric:        Mood and Affect: Mood normal.     Labs reviewed: Basic Metabolic Panel: Recent Labs    05/08/21 0921 08/13/21 0942 09/28/21 0845  NA 139 142 139  K 4.1 5.2 4.5  CL 101 106 106  CO2 29 31 26   GLUCOSE 92 103* 110*  BUN 17 21 16   CREATININE 0.68 0.69 0.66  CALCIUM 9.5 9.8 9.5   Liver Function Tests: Recent Labs    05/08/21 0921 08/13/21 0942  AST 21 22  ALT 15 15  BILITOT 1.0 0.8  PROT 7.0 6.7   No results for input(s): "LIPASE", "AMYLASE" in the last 8760 hours. No results for  input(s): "AMMONIA" in the last 8760 hours. CBC: Recent Labs    05/08/21 0921 08/13/21 0942 09/28/21 0845  WBC 7.9 6.8 7.1  NEUTROABS 4,053 2,271 3.2  HGB 14.8 14.1 14.2  HCT 43.5 41.9 42.5  MCV 87.9 88.6 90.4  PLT 310 282 248   Lipid Panel: Recent Labs    02/05/21 0839 05/08/21 0921  CHOL 254* 239*  HDL 63 66  LDLCALC 164* 148*  TRIG 136 124  CHOLHDL 4.0 3.6   TSH: No results for input(s): "TSH" in the last 8760 hours. A1C: Lab Results  Component Value Date   HGBA1C 5.5 11/06/2020     Assessment/Plan 1. Obesity (BMI 30.0-34.9) --education provided on healthy weight loss through increase in physical activity and proper nutrition   2. Mixed hyperlipidemia -she is agreeable to start zetia as she is unable to make dietary modifications.  - Lipid panel - ezetimibe (ZETIA) 10 MG tablet; Take 1 tablet (10 mg total) by mouth daily.  Dispense: 90 tablet; Refill: 3  3. Asthma, allergic, mild intermittent, uncomplicated -stable at this time.   4. Allergic rhinitis, unspecified seasonality, unspecified trigger Doing well, continues on zytrec with nettipot daily  5. Essential hypertension -Blood pressure well  controlled Continue current medications Recheck metabolic panel  6. Peripheral polyneuropathy -stable on gabapentin   7. Osteoarthritis, unspecified osteoarthritis type, unspecified site Ongoing, continues to have pain in knees. Discussed weight loss, continues tylenol and followed by orthopedics  8. Gastroesophageal reflux disease without esophagitis -controlled on nexium.   9. Chronic right-sided low back pain without sciatica Ongoing, continues on tylenol, gabapentin and follow up with orthopedics   Return in about 4 months (around 03/19/2022) for routine follow up . Carlos American. [place], [facility] [phone]

## 2021-11-17 LAB — LIPID PANEL
Cholesterol: 212 mg/dL — ABNORMAL HIGH (ref <200)
HDL: 51 mg/dL (ref >=50)
LDL Cholesterol (Calc): 134 mg/dL (calc) — ABNORMAL HIGH
Non-HDL Cholesterol (Calc): 161 mg/dL (calc) — ABNORMAL HIGH (ref <130)
Total CHOL/HDL Ratio: 4.2 (calc) (ref <5.0)
Triglycerides: 143 mg/dL (ref <150)

## 2021-11-19 DIAGNOSIS — C4441 Basal cell carcinoma of skin of scalp and neck: Secondary | ICD-10-CM | POA: Diagnosis not present

## 2021-11-19 DIAGNOSIS — Z85828 Personal history of other malignant neoplasm of skin: Secondary | ICD-10-CM | POA: Diagnosis not present

## 2021-11-19 DIAGNOSIS — D485 Neoplasm of uncertain behavior of skin: Secondary | ICD-10-CM | POA: Diagnosis not present

## 2021-11-19 DIAGNOSIS — L821 Other seborrheic keratosis: Secondary | ICD-10-CM | POA: Diagnosis not present

## 2021-11-23 ENCOUNTER — Ambulatory Visit: Payer: PPO | Admitting: Cardiovascular Disease

## 2021-12-03 ENCOUNTER — Encounter: Payer: Self-pay | Admitting: Gastroenterology

## 2021-12-17 ENCOUNTER — Telehealth: Payer: Self-pay | Admitting: Gastroenterology

## 2021-12-17 NOTE — Telephone Encounter (Signed)
Inbound call from patient stating that she got a letter in the mail that it was time to schedule a follow up with Dr. Havery Moros. I advised patient that his next available is October 4th at 1:40. Patient stated that she could not wait that long and wanted a message sent to the nurse so she can discuss the issues she is having. Please advise.

## 2021-12-17 NOTE — Telephone Encounter (Signed)
Maddie, please let patient know that there are no sooner appts available at this time. Dr. Havery Moros is not in the office every day. Patient can be scheduled for a sooner appt with an APP if she prefers. Thanks

## 2021-12-21 ENCOUNTER — Ambulatory Visit (INDEPENDENT_AMBULATORY_CARE_PROVIDER_SITE_OTHER): Payer: PPO | Admitting: Nurse Practitioner

## 2021-12-21 ENCOUNTER — Encounter: Payer: Self-pay | Admitting: Nurse Practitioner

## 2021-12-21 VITALS — BP 142/84 | HR 84 | Temp 96.0°F | Ht 62.0 in | Wt 180.4 lb

## 2021-12-21 DIAGNOSIS — K219 Gastro-esophageal reflux disease without esophagitis: Secondary | ICD-10-CM

## 2021-12-21 DIAGNOSIS — I480 Paroxysmal atrial fibrillation: Secondary | ICD-10-CM

## 2021-12-21 DIAGNOSIS — R079 Chest pain, unspecified: Secondary | ICD-10-CM

## 2021-12-21 DIAGNOSIS — E782 Mixed hyperlipidemia: Secondary | ICD-10-CM

## 2021-12-21 DIAGNOSIS — G629 Polyneuropathy, unspecified: Secondary | ICD-10-CM

## 2021-12-21 DIAGNOSIS — I8500 Esophageal varices without bleeding: Secondary | ICD-10-CM | POA: Diagnosis not present

## 2021-12-21 DIAGNOSIS — K7460 Unspecified cirrhosis of liver: Secondary | ICD-10-CM

## 2021-12-21 DIAGNOSIS — M25471 Effusion, right ankle: Secondary | ICD-10-CM | POA: Diagnosis not present

## 2021-12-21 DIAGNOSIS — M25472 Effusion, left ankle: Secondary | ICD-10-CM

## 2021-12-21 NOTE — Progress Notes (Signed)
Careteam:. Patient Care Team: Lauree Chandler, NP as PCP - General (Nurse Practitioner)  PLACE OF SERVICE:  [place]  Advanced Directive information    Allergies  Allergen Reactions   Ace Inhibitors     cough   Amoxicillin Itching    REACTION: unknown? pain in right kidney   Benicar Hct [Olmesartan Medoxomil-Hctz]     Extreme weakness   Budesonide-Formoterol Fumarate     Causes tremors and numbness   Chlorzoxazone Hives   Chlorzoxazone [Chlorzoxazone]    Citalopram Hydrobromide     REACTION: hives   Citalopram Hydrobromide    Clonidine Derivatives    Cymbalta [Duloxetine Hcl]    Droperidol     REACTION: hives   Flexeril [Cyclobenzaprine Hcl]    Fluconazole Nausea And Vomiting and Other (See Comments)    fatigue   Formoterol Itching   Gabapentin Itching   Ketorolac Tromethamine     REACTION: hives   Ketorolac Tromethamine    Metoclopramide Hcl    Mometasone Furo-Formoterol Fum     Causes sore throat, blurred vision and unable to sleep--started on med 09-17-10   Mometasone Furoate Itching   Morphine     REACTION: hives and itching   Olmesartan Medoxomil     REACTION: fatique   Breo Ellipta [Fluticasone Furoate-Vilanterol] Itching    Pt reports itching with Memory Dance is related to being lactose intolerant.     Chief Complaint  Patient presents with   Acute Visit    Patient presents today for bilateral leg,feet and ankle swelling. She also reports acid reflux and is currently taking Nexium.      HPI: Patient is a 81 y.o. female with acute c/o ankle edema and GERD.   Ankle edema started 1.5 weeks ago, rt> lt. She denies dietary changes but does endorse a diet high in salt, mostly from frozen meals. Swelling is localized to the ankles and is not spreading. Swelling is worse at night . Only recent medication change was the addition of Zetia. Does not note any injuries, but states she has been exercising more and walking. She has a new rollator with a seat which  has helped her to exercise more. She has cirrhosis of the liver and is concerned the swelling is a indicator of progression.  No signs of bleeding.   She does endorse sharp chest pain as well, that is noticeable different from her GERD pain. She notes the pain in her chest has been intermittent for the last three weeks, but worse for the last week and a half. The pain is worse in the evenings but does not awaken her from sleep. She does sleep sitting up and has done so for a while. This is accompanied with dyspnea. Has both dyspnea on exertion as well as at rest.  States that her GERD is worse but also notes that she has been eating worse. TUMS help with the reflux.  She consistently takes her Nexium. Reports when she did change her diet the GERD improved.   Weight gain of five pounds within the last month.   Review of Systems:  Review of Systems  Constitutional:  Negative for chills, fever and weight loss.  HENT:  Negative for congestion and sore throat.   Eyes:  Negative for blurred vision and double vision.  Respiratory:  Positive for cough and shortness of breath. Negative for wheezing.   Cardiovascular:  Positive for chest pain, orthopnea and leg swelling. Negative for palpitations.  Gastrointestinal:  Positive for  heartburn. Negative for abdominal pain, blood in stool, constipation, diarrhea, nausea and vomiting.  Genitourinary:  Negative for dysuria and hematuria.  Musculoskeletal:  Positive for back pain and joint pain.  Skin:  Negative for itching and rash.  Neurological:  Negative for tingling and headaches.  Endo/Heme/Allergies:  Negative for environmental allergies.  Psychiatric/Behavioral:  Negative for depression. The patient is not nervous/anxious.     Past Medical History:  Diagnosis Date   ALLERGIC RHINITIS    Anxiety    Asthma    Blood type O+    Cervical strain, acute    Cirrhosis ([place])    Colon polyp 2010   adenoma   Diverticulosis of colon    Dizziness     Esophageal varices ([facility])    Fibromyalgia    GERD (gastroesophageal reflux disease)    History of nephrolithiasis    Hypercholesterolemia    borderline   Hypertension    IBS (irritable bowel syndrome)    Osteoarthritis    Personal history of allergy to unspecified medicinal agent    Postconcussion syndrome    Vitamin D deficiency    Past Surgical History:  Procedure Laterality Date   CHOLECYSTECTOMY, LAPAROSCOPIC  2004   for gallstone pancreatitis   KNEE SURGERY Right    KNEE SURGERY Left    THYROID SURGERY     cyst removed   Social History:   reports that she quit smoking about 59 years ago. Her smoking use included cigarettes. She has a 16.00 pack-year smoking history. She has never used smokeless tobacco. She reports that she does not drink alcohol and does not use drugs.  Family History  Problem Relation Age of Onset   Breast cancer Mother    Alzheimer's disease Mother    Ovarian cancer Sister    Heart disease Sister    Breast cancer Sister 56   Alcohol abuse Sister    COPD Brother    Alcohol abuse Brother    Ovarian cancer Paternal Grandmother    Heart attack Maternal Uncle    COPD Cousin        Maternal side    Arthritis Other        Cousins    Colon cancer Neg Hx    Esophageal cancer Neg Hx     Medications: Patient's Medications  New Prescriptions   No medications on file  Previous Medications   ACETAMINOPHEN (TYLENOL) 500 MG TABLET    Take 1,000 mg by mouth 2 (two) times a day.   ALBUTEROL (PROAIR HFA) 108 (90 BASE) MCG/ACT INHALER    INHALE 2 PUFFS EVERY 4 HOURS AS NEEDED INTO THE LUNGS FOR WHEEZING OR SHORTNESS OF BREATH J45.20   AMLODIPINE (NORVASC) 5 MG TABLET    TAKE 1 TABLET (5 MG TOTAL) BY MOUTH DAILY. I10   APIXABAN (ELIQUIS) 2.5 MG TABS TABLET    Take 1 tablet (2.5 mg total) by mouth 2 (two) times daily.   CETIRIZINE (ZYRTEC) 10 MG TABLET    Take 10 mg by mouth daily.   CHOLECALCIFEROL 50 MCG (2000 UT) CAPS    Take 2,000 Units by mouth in the  morning and at bedtime.   ESOMEPRAZOLE (NEXIUM) 20 MG CAPSULE    TAKE 1 CAPSULE BY MOUTH EVERY DAY   EZETIMIBE (ZETIA) 10 MG TABLET    Take 1 tablet (10 mg total) by mouth daily.   FLUTICASONE (FLOVENT HFA) 110 MCG/ACT INHALER    Inhale 1-2 puffs into the lungs every 12 (twelve) hours.  GABAPENTIN (NEURONTIN) 100 MG CAPSULE    Take 3 mg by mouth at bedtime.   LOSARTAN (COZAAR) 25 MG TABLET    TAKE 1 TABLET BY MOUTH EVERY DAY   OMEGA-3 FATTY ACIDS (FISH OIL MAXIMUM STRENGTH) 1200 MG CAPS    Take 1,200 mg by mouth 2 (two) times daily.    PROPRANOLOL (INDERAL) 10 MG TABLET    Take 1 tablet (10 mg total) by mouth 2 (two) times daily.   PROPYLENE GLYCOL (SYSTANE BALANCE) 0.6 % SOLN    Place 2 drops into both eyes 2 (two) times daily.   SODIUM CHLORIDE (OCEAN) 0.65 % NASAL SPRAY    Place 1 spray into the nose daily.  Modified Medications   No medications on file  Discontinued Medications   No medications on file    Physical Exam:  Vitals:   12/21/21 1049  BP: (!) 142/84  Pulse: 84  Temp: (!) 96 F (35.6 C)  SpO2: 97%  Weight: 81.8 kg  Height: 5' 2"  (1.575 m)   Body mass index is 33 kg/m. Wt Readings from Last 3 Encounters:  12/21/21 81.8 kg  11/16/21 79.6 kg  09/28/21 77.6 kg    Physical Exam Constitutional:      General: She is not in acute distress.    Appearance: She is not toxic-appearing.  HENT:     Right Ear: Tympanic membrane, ear canal and external ear normal. There is no impacted cerumen.     Left Ear: Ear canal and external ear normal. There is no impacted cerumen.     Nose: No congestion.     Mouth/Throat:     Mouth: Mucous membranes are moist.  Eyes:     Conjunctiva/sclera: Conjunctivae normal.     Pupils: Pupils are equal, round, and reactive to light.  Cardiovascular:     Rate and Rhythm: Normal rate and regular rhythm.     Pulses: Normal pulses.     Heart sounds: Normal heart sounds. No murmur heard. Pulmonary:     Effort: Pulmonary effort is normal. No  respiratory distress.     Breath sounds: Normal breath sounds.  Abdominal:     General: Bowel sounds are normal. There is no distension.     Palpations: Abdomen is soft.     Tenderness: There is no abdominal tenderness.  Musculoskeletal:     Right lower leg: Edema present.     Left lower leg: No edema.     Comments: Trace, non-pitting  Neurological:     Mental Status: She is alert and oriented to person, place, and time. Mental status is at baseline.  Psychiatric:        Mood and Affect: Mood normal.        Behavior: Behavior normal.     Labs reviewed: Basic Metabolic Panel: Recent Labs    05/08/21 0921 08/13/21 0942 09/28/21 0845  NA 139 142 139  K 4.1 5.2 4.5  CL 101 106 106  CO2 29 31 26   GLUCOSE 92 103* 110*  BUN 17 21 16   CREATININE 0.68 0.69 0.66  CALCIUM 9.5 9.8 9.5   Liver Function Tests: Recent Labs    05/08/21 0921 08/13/21 0942  AST 21 22  ALT 15 15  BILITOT 1.0 0.8  PROT 7.0 6.7   No results for input(s): "LIPASE", "AMYLASE" in the last 8760 hours. No results for input(s): "AMMONIA" in the last 8760 hours. CBC: Recent Labs    05/08/21 0921 08/13/21 0942 09/28/21 0845  WBC 7.9  6.8 7.1  NEUTROABS 4,053 2,271 3.2  HGB 14.8 14.1 14.2  HCT 43.5 41.9 42.5  MCV 87.9 88.6 90.4  PLT 310 282 248   Lipid Panel: Recent Labs    02/05/21 0839 05/08/21 0921 11/16/21 0839  CHOL 254* 239* 212*  HDL 63 66 51  LDLCALC 164* 148* 134*  TRIG 136 124 143  CHOLHDL 4.0 3.6 4.2   TSH: No results for input(s): "TSH" in the last 8760 hours. A1C: Lab Results  Component Value Date   HGBA1C 5.5 11/06/2020     Assessment/Plan 1. Chest pain, unspecified type - EKG 12-Lead in SR without acute changes  -of note she went to the ED in May with chest pains as well.  -weight loss and dietary changes encouraged but also instructed to follow up with cardiologist  2. Ankle Edema -due to venous insuffiencey and salt in diet.  - diet modifications, low salt  recommended.  - elevate legs above the level of the heart - Compression hose  2. Esophageal varices without bleeding, unspecified esophageal varices type ([facility]) - stable, discussed importance of blood pressure control. -continues on propranolol  - CMP with eGFR([facility]) - CBC with Differential/Platelet  3. Gastroesophageal reflux disease without esophagitis - important for her to make dietary and lifestyle modifications as this has significantly improved symptoms in the past - Continue Nexium, can use TUM prn  4. Paroxysmal atrial fibrillation ([facility]) - rate controlled.  - continue eliquis, monitor for bleeding  5. Mixed Hyperlipidemia - continues on zetia with diet modifications   Student- Waunita Schooner, RN I personally was present during the history, physical exam and medical decision-making activities of this service and have verified that the service and findings are accurately documented in the student's note  Alexsys Eskin K. [place], [facility] [phone]

## 2021-12-21 NOTE — Patient Instructions (Signed)
Goal blood pressure <140/90  -encouraged to elevate legs above level of heart as tolerates, low sodium diet, compression hose as tolerates (on in am, off in pm)

## 2021-12-22 LAB — CBC WITH DIFFERENTIAL/PLATELET
Absolute Monocytes: 745 cells/uL (ref 200–950)
Basophils Absolute: 57 cells/uL (ref 0–200)
Basophils Relative: 0.7 %
Eosinophils Absolute: 113 cells/uL (ref 15–500)
Eosinophils Relative: 1.4 %
HCT: 42.5 % (ref 35.0–45.0)
Hemoglobin: 14.6 g/dL (ref 11.7–15.5)
Lymphs Abs: 3596 cells/uL (ref 850–3900)
MCH: 30.6 pg (ref 27.0–33.0)
MCHC: 34.4 g/dL (ref 32.0–36.0)
MCV: 89.1 fL (ref 80.0–100.0)
MPV: 9.9 fL (ref 7.5–12.5)
Monocytes Relative: 9.2 %
Neutro Abs: 3588 cells/uL (ref 1500–7800)
Neutrophils Relative %: 44.3 %
Platelets: 301 10*3/uL (ref 140–400)
RBC: 4.77 10*6/uL (ref 3.80–5.10)
RDW: 12.5 % (ref 11.0–15.0)
Total Lymphocyte: 44.4 %
WBC: 8.1 10*3/uL (ref 3.8–10.8)

## 2021-12-22 LAB — COMPLETE METABOLIC PANEL WITH GFR
AG Ratio: 1.8 (calc) (ref 1.0–2.5)
ALT: 17 U/L (ref 6–29)
AST: 24 U/L (ref 10–35)
Albumin: 4.2 g/dL (ref 3.6–5.1)
Alkaline phosphatase (APISO): 92 U/L (ref 37–153)
BUN: 14 mg/dL (ref 7–25)
CO2: 28 mmol/L (ref 20–32)
Calcium: 9.5 mg/dL (ref 8.6–10.4)
Chloride: 106 mmol/L (ref 98–110)
Creat: 0.64 mg/dL (ref 0.60–0.95)
Globulin: 2.4 g/dL (calc) (ref 1.9–3.7)
Glucose, Bld: 98 mg/dL (ref 65–99)
Potassium: 4.3 mmol/L (ref 3.5–5.3)
Sodium: 142 mmol/L (ref 135–146)
Total Bilirubin: 0.6 mg/dL (ref 0.2–1.2)
Total Protein: 6.6 g/dL (ref 6.1–8.1)
eGFR: 88 mL/min/{1.73_m2} (ref >=60)

## 2021-12-25 DIAGNOSIS — R072 Precordial pain: Secondary | ICD-10-CM | POA: Insufficient documentation

## 2021-12-25 NOTE — Progress Notes (Unsigned)
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 female with the above problem list.  She was last seen by Dr. Angelena Form in March 2023.  She was recently seen by primary care and noted chest discomfort.  She was asked to return to cardiology for further evaluation.  She lives at [facility] independent living. She is here alone today. She notes leg swelling over the past 2 weeks. She notes dyspnea on exertion over the past 2 weeks with minimal exertion. She uses a walker b/c of balance, back pain. Upon chart review, her weight is up 6+ lbs. She sleeps on an incline chronically due to GERD. She has not had paroxysmal nocturnal dyspnea. She has not had chest pain or pressure. She has felt an occasional vibratory sensation. She has also heard a "noise". She has had a mild non-productive cough. She admits to diet with a high salt load.       Past Medical History:  Diagnosis Date   ALLERGIC RHINITIS    Anxiety    Asthma    Blood type O+    Cervical strain, acute    Cirrhosis ([place])    Colon polyp 2010   adenoma   Diverticulosis of colon    Dizziness    Esophageal varices (HCC)    Fibromyalgia    GERD (gastroesophageal reflux disease)    History of nephrolithiasis    Hypercholesterolemia    borderline   Hypertension    IBS (irritable bowel syndrome)     Osteoarthritis    Personal history of allergy to unspecified medicinal agent    Postconcussion syndrome    Vitamin D deficiency    Current Medications: Current Meds  Medication Sig   acetaminophen (TYLENOL) 500 MG tablet Take 1,000 mg by mouth 2 (two) times a day.   albuterol (PROAIR HFA) 108 (90 Base) MCG/ACT inhaler INHALE 2 PUFFS EVERY 4 HOURS AS NEEDED INTO THE LUNGS FOR WHEEZING OR SHORTNESS OF BREATH J45.20   amLODipine (NORVASC) 5 MG tablet TAKE 1 TABLET (5 MG TOTAL) BY MOUTH DAILY. I10   apixaban (ELIQUIS) 2.5 MG TABS tablet Take 1 tablet (2.5 mg total) by mouth 2 (two) times daily.   cetirizine (ZYRTEC) 10 MG tablet Take 10 mg by mouth daily.   Cholecalciferol 50 MCG (2000 UT) CAPS Take 2,000 Units by mouth in the morning and at bedtime.   esomeprazole (NEXIUM) 20 MG capsule TAKE 1 CAPSULE BY MOUTH EVERY DAY   ezetimibe (ZETIA) 10 MG tablet Take 1 tablet (10 mg total) by mouth daily.   fluticasone (FLOVENT HFA) 110 MCG/ACT inhaler Inhale 1-2 puffs into the lungs every 12 (twelve) hours.   furosemide (LASIX) 20 MG tablet Take 1 tablet (20 mg total) by mouth daily.   gabapentin (NEURONTIN) 100 MG capsule Take 3 mg by mouth at bedtime.   losartan (COZAAR) 25 MG  tablet TAKE 1 TABLET BY MOUTH EVERY DAY   Omega-3 Fatty Acids (FISH OIL MAXIMUM STRENGTH) 1200 MG CAPS Take 1,200 mg by mouth 2 (two) times daily.    propranolol (INDERAL) 10 MG tablet Take 1 tablet (10 mg total) by mouth 2 (two) times daily.   Propylene Glycol (SYSTANE BALANCE) 0.6 % SOLN Place 2 drops into both eyes 2 (two) times daily.   sodium chloride (OCEAN) 0.65 % nasal spray Place 1 spray into the nose daily.    Allergies:   Ace inhibitors, Amoxicillin, Benicar hct [olmesartan medoxomil-hctz], Budesonide-formoterol fumarate, Chlorzoxazone, Chlorzoxazone [chlorzoxazone], Citalopram hydrobromide, Citalopram hydrobromide, Clonidine derivatives, Cymbalta [duloxetine hcl], Droperidol, Flexeril [cyclobenzaprine hcl],  Fluconazole, Formoterol, Gabapentin, Ketorolac tromethamine, Ketorolac tromethamine, Metoclopramide hcl, Mometasone furo-formoterol fum, Mometasone furoate, Morphine, Olmesartan medoxomil, and Breo ellipta [fluticasone furoate-vilanterol]   Social History   Tobacco Use   Smoking status: Former    Packs/day: 2.00    Years: 8.00    Total pack years: 16.00    Types: Cigarettes    Quit date: 05/13/1962    Years since quitting: 59.6   Smokeless tobacco: Never  Vaping Use   Vaping Use: Never used  Substance Use Topics   Alcohol use: No   Drug use: No    Family Hx: The patient's family history includes Alcohol abuse in her brother and sister; Alzheimer's disease in her mother; Arthritis in an other family member; Breast cancer in her mother; Breast cancer (age of onset: 58) in her sister; COPD in her brother and cousin; Heart attack in her maternal uncle; Heart disease in her sister; Ovarian cancer in her paternal grandmother and sister. There is no history of Colon cancer or Esophageal cancer.  Review of Systems  Constitutional: Negative for chills and fever.  Skin:  Negative for rash.  Gastrointestinal:  Negative for hematochezia.  Genitourinary:  Negative for hematuria.     EKGs/Labs/Other Test Reviewed:    EKG:  EKG is not ordered today.  The ekg ordered today demonstrates n/a  Electrocardiogram from primary care obtained on 12/21/2021 personally reviewed and interpreted: NSR, HR 65, normal axis, no ST-T wave changes, QTc 390, no change from prior tracing  Recent Labs: 12/21/2021: ALT 17; BUN 14; Creat 0.64; Hemoglobin 14.6; Platelets 301; Potassium 4.3; Sodium 142   Recent Lipid Panel Recent Labs    11/16/21 0839  CHOL 212*  TRIG 143  HDL 51  LDLCALC 134*     Risk Assessment/Calculations/Metrics:    CHA2DS2-VASc Score = 5   This indicates a 7.2% annual risk of stroke. The patient's score is based upon: CHF History: 1 HTN History: 1 Diabetes History: 0 Stroke History:  0 Vascular Disease History: 0 Age Score: 2 Gender Score: 1             Physical Exam:    VS:  BP 120/80   Pulse 60   Ht 5' 4"  (1.626 m)   Wt 178 lb 3.2 oz (80.8 kg)   SpO2 97%   BMI 30.59 kg/m     Wt Readings from Last 3 Encounters:  12/26/21 178 lb 3.2 oz (80.8 kg)  12/21/21 180 lb 6.4 oz (81.8 kg)  11/16/21 175 lb 6.4 oz (79.6 kg)    Constitutional:      Appearance: Healthy appearance. Not in distress.  Neck:     Vascular: JVD normal.  Pulmonary:     Effort: Pulmonary effort is normal.     Breath sounds: No wheezing. Bibasilar Rales (faint) present.  Cardiovascular:  Normal rate. Regular rhythm. Normal S1. Normal S2.      Murmurs: There is no murmur.     No gallop.   Edema:    Peripheral edema present.    Pretibial: bilateral trace edema of the pretibial area.    Comments: Left greater than the right Abdominal:     Palpations: Abdomen is soft.  Skin:    General: Skin is warm and dry.  Neurological:     Mental Status: Alert and oriented to person, place and time.         ASSESSMENT & PLAN:   Acute heart failure with preserved ejection fraction (HFpEF) (HCC) Echocardiogram in July 2021 with normal ejection fraction.  She presents with weight gain, leg swelling and worsening shortness of breath with exertion over the past 2 weeks.  She admits to a diet high in salt.  She has faint rales in her lung bases bilaterally.  I suspect her shortness of breath is all related to volume excess.  She does not take diuretic therapy.  Start furosemide 20 mg daily.  Obtain follow-up BMET, BNP in 1 week.  Arrange follow-up echocardiogram.  Low-sodium diet.  Follow-up with Dr. Angelena Form or me in 4 weeks.  Precordial chest pain She has not had chest pain or pressure or exertional symptoms.  She mainly describes a vibratory sensation.  She is not really describing palpitations.  Question if this is all related to volume excess.  Proceed with echocardiogram as noted.  If she  continues to have symptoms despite improved volume status, consider stress testing.  If she has symptoms more consistent with palpitations, consider event monitor.  Atrial fibrillation (HCC) Maintaining sinus rhythm.  Her weight is greater than 60 kg and her creatinine is less than 1.5.  I am not sure why she is on low-dose Eliquis.  I have reviewed her chart.  Question if it may be related to a history of cirrhosis with esophageal varices.  I will review further with Dr. Angelena Form.  For now, continue Eliquis 2.5 mg twice daily.  Recent creatinine, hemoglobin normal.  Essential hypertension The patient's blood pressure is controlled on her current regimen.  Continue current therapy.             Dispo:  Return in about 4 weeks (around 01/23/2022) for Follow up after testing with Dr. Angelena Form, or Richardson Dopp, PA-C.   Medication Adjustments/Labs and Tests Ordered: Current medicines are reviewed at length with the patient today.  Concerns regarding medicines are outlined above.  Tests Ordered: Orders Placed This Encounter  Procedures   Basic metabolic panel   Pro b natriuretic peptide (BNP)   ECHOCARDIOGRAM COMPLETE   Medication Changes: Meds ordered this encounter  Medications   furosemide (LASIX) 20 MG tablet    Sig: Take 1 tablet (20 mg total) by mouth daily.    Dispense:  90 tablet    Refill:  3   Signed, Richardson Dopp, PA-C  12/26/2021 11:43 AM    [address], [place], [place]  [address] Phone: [phone]; Fax: [phone]

## 2021-12-26 ENCOUNTER — Encounter: Payer: Self-pay | Admitting: Physician Assistant

## 2021-12-26 ENCOUNTER — Ambulatory Visit (INDEPENDENT_AMBULATORY_CARE_PROVIDER_SITE_OTHER): Payer: PPO | Admitting: Physician Assistant

## 2021-12-26 VITALS — BP 120/80 | HR 60 | Ht 64.0 in | Wt 178.2 lb

## 2021-12-26 DIAGNOSIS — I5020 Unspecified systolic (congestive) heart failure: Secondary | ICD-10-CM | POA: Diagnosis not present

## 2021-12-26 DIAGNOSIS — I5031 Acute diastolic (congestive) heart failure: Secondary | ICD-10-CM

## 2021-12-26 DIAGNOSIS — R072 Precordial pain: Secondary | ICD-10-CM

## 2021-12-26 DIAGNOSIS — I10 Essential (primary) hypertension: Secondary | ICD-10-CM

## 2021-12-26 DIAGNOSIS — I480 Paroxysmal atrial fibrillation: Secondary | ICD-10-CM

## 2021-12-26 DIAGNOSIS — I5030 Unspecified diastolic (congestive) heart failure: Secondary | ICD-10-CM | POA: Insufficient documentation

## 2021-12-26 HISTORY — DX: Unspecified diastolic (congestive) heart failure: I50.30

## 2021-12-26 MED ORDER — FUROSEMIDE 20 MG PO TABS: 20.0000 mg | ORAL_TABLET | Freq: Every day | ORAL | 3 refills | Status: DC

## 2021-12-26 NOTE — Patient Instructions (Signed)
Medication Instructions:  Your physician has recommended you make the following change in your medication:   START Lasix 20 mg taking 1 tablet daily    *If you need a refill on your cardiac medications before your next appointment, please call your pharmacy*   Lab Work: 1 WEEK:  BMET & PRO BNP  If you have labs (blood work) drawn today and your tests are completely normal, you will receive your results only by: [facility] (if you have MyChart) OR A paper copy in the mail If you have any lab test that is abnormal or we need to change your treatment, we will call you to review the results.   Testing/Procedures: Your physician has requested that you have an echocardiogram. Echocardiography is a painless test that uses sound waves to create images of your heart. It provides your doctor with information about the size and shape of your heart and how well your heart's chambers and valves are working. This procedure takes approximately one hour. There are no restrictions for this procedure.    Follow-Up: At [facility], you and your health needs are our priority.  As part of our continuing mission to provide you with exceptional heart care, we have created designated Provider Care Teams.  These Care Teams include your primary Cardiologist (physician) and Advanced Practice Providers (APPs -  Physician Assistants and Nurse Practitioners) who all work together to provide you with the care you need, when you need it.  We recommend signing up for the patient portal called "MyChart".  Sign up information is provided on this After Visit Summary.  MyChart is used to connect with patients for Virtual Visits (Telemedicine).  Patients are able to view lab/test results, encounter notes, upcoming appointments, etc.  Non-urgent messages can be sent to your provider as well.   To learn more about what you can do with MyChart, go to [email or web address].    Your next appointment:   4 WEEKS   The  format for your next appointment:   In Person  Provider:   Lauree Chandler, MD  or Richardson Dopp, PA-C         Other Instructions   Important Information About Sugar

## 2021-12-26 NOTE — Assessment & Plan Note (Signed)
She has not had chest pain or pressure or exertional symptoms.  She mainly describes a vibratory sensation.  She is not really describing palpitations.  Question if this is all related to volume excess.  Proceed with echocardiogram as noted.  If she continues to have symptoms despite improved volume status, consider stress testing.  If she has symptoms more consistent with palpitations, consider event monitor.

## 2021-12-26 NOTE — Assessment & Plan Note (Signed)
The patient's blood pressure is controlled on her current regimen.  Continue current therapy.   

## 2021-12-26 NOTE — Assessment & Plan Note (Signed)
Maintaining sinus rhythm.  Her weight is greater than 60 kg and her creatinine is less than 1.5.  I am not sure why she is on low-dose Eliquis.  I have reviewed her chart.  Question if it may be related to a history of cirrhosis with esophageal varices.  I will review further with Dr. Angelena Form.  For now, continue Eliquis 2.5 mg twice daily.  Recent creatinine, hemoglobin normal.

## 2021-12-26 NOTE — Assessment & Plan Note (Signed)
Echocardiogram in July 2021 with normal ejection fraction.  She presents with weight gain, leg swelling and worsening shortness of breath with exertion over the past 2 weeks.  She admits to a diet high in salt.  She has faint rales in her lung bases bilaterally.  I suspect her shortness of breath is all related to volume excess.  She does not take diuretic therapy.  Start furosemide 20 mg daily.  Obtain follow-up BMET, BNP in 1 week.  Arrange follow-up echocardiogram.  Low-sodium diet.  Follow-up with Dr. Angelena Form or me in 4 weeks.

## 2022-01-01 ENCOUNTER — Other Ambulatory Visit: Payer: Self-pay | Admitting: Nurse Practitioner

## 2022-01-01 DIAGNOSIS — Z1231 Encounter for screening mammogram for malignant neoplasm of breast: Secondary | ICD-10-CM

## 2022-01-02 ENCOUNTER — Other Ambulatory Visit: Payer: PPO

## 2022-01-02 DIAGNOSIS — R072 Precordial pain: Secondary | ICD-10-CM | POA: Diagnosis not present

## 2022-01-02 DIAGNOSIS — I5020 Unspecified systolic (congestive) heart failure: Secondary | ICD-10-CM

## 2022-01-02 DIAGNOSIS — I480 Paroxysmal atrial fibrillation: Secondary | ICD-10-CM | POA: Diagnosis not present

## 2022-01-02 DIAGNOSIS — I10 Essential (primary) hypertension: Secondary | ICD-10-CM

## 2022-01-03 LAB — BASIC METABOLIC PANEL
BUN/Creatinine Ratio: 20 (ref 12–28)
BUN: 15 mg/dL (ref 8–27)
CO2: 25 mmol/L (ref 20–29)
Calcium: 9.8 mg/dL (ref 8.7–10.3)
Chloride: 102 mmol/L (ref 96–106)
Creatinine, Ser: 0.75 mg/dL (ref 0.57–1.00)
Glucose: 99 mg/dL (ref 70–99)
Potassium: 4.2 mmol/L (ref 3.5–5.2)
Sodium: 140 mmol/L (ref 134–144)
eGFR: 79 mL/min/{1.73_m2} (ref >59)

## 2022-01-03 LAB — PRO B NATRIURETIC PEPTIDE: NT-Pro BNP: 97 pg/mL (ref 0–738)

## 2022-01-03 NOTE — Progress Notes (Signed)
Pt has been made aware of normal result and verbalized understanding.  jw

## 2022-01-07 DIAGNOSIS — M170 Bilateral primary osteoarthritis of knee: Secondary | ICD-10-CM | POA: Diagnosis not present

## 2022-01-09 ENCOUNTER — Ambulatory Visit (HOSPITAL_COMMUNITY): Payer: PPO | Attending: Cardiology

## 2022-01-09 DIAGNOSIS — I480 Paroxysmal atrial fibrillation: Secondary | ICD-10-CM

## 2022-01-09 DIAGNOSIS — I5020 Unspecified systolic (congestive) heart failure: Secondary | ICD-10-CM | POA: Diagnosis not present

## 2022-01-09 DIAGNOSIS — R072 Precordial pain: Secondary | ICD-10-CM | POA: Diagnosis not present

## 2022-01-09 DIAGNOSIS — I10 Essential (primary) hypertension: Secondary | ICD-10-CM | POA: Diagnosis not present

## 2022-01-09 LAB — ECHOCARDIOGRAM COMPLETE
Area-P 1/2: 2.75 cm2
S' Lateral: 2.7 cm

## 2022-01-10 ENCOUNTER — Encounter: Payer: Self-pay | Admitting: Physician Assistant

## 2022-01-22 ENCOUNTER — Encounter: Payer: Self-pay | Admitting: Nurse Practitioner

## 2022-01-22 ENCOUNTER — Ambulatory Visit (INDEPENDENT_AMBULATORY_CARE_PROVIDER_SITE_OTHER): Payer: PPO | Admitting: Nurse Practitioner

## 2022-01-22 ENCOUNTER — Telehealth: Payer: Self-pay

## 2022-01-22 DIAGNOSIS — Z0000 Encounter for general adult medical examination without abnormal findings: Secondary | ICD-10-CM

## 2022-01-22 DIAGNOSIS — E2839 Other primary ovarian failure: Secondary | ICD-10-CM | POA: Diagnosis not present

## 2022-01-22 NOTE — Progress Notes (Signed)
Subjective:   MARIHA SLEEPER is a 81 y.o. female who presents for Medicare Annual (Subsequent) preventive examination.  Review of Systems     Cardiac Risk Factors include: advanced age (>43mn, >>56women);diabetes mellitus;hypertension;dyslipidemia;obesity (BMI >30kg/m2);sedentary lifestyle     Objective:    There were no vitals filed for this visit. There is no height or weight on file to calculate BMI.     01/22/2022    9:51 AM 09/28/2021    8:42 AM 08/13/2021    9:13 AM 05/11/2021    9:25 AM 02/08/2021   11:34 AM 01/16/2021    9:38 AM 01/01/2021   11:03 AM  Advanced Directives  Does Patient Have a Medical Advance Directive? Yes No Yes Yes Yes Yes Yes  Type of Advance Directive Out of facility DNR (pink MOST or yellow form)  Out of facility DNR (pink MOST or yellow form) Out of facility DNR (pink MOST or yellow form) Out of facility DNR (pink MOST or yellow form) Out of facility DNR (pink MOST or yellow form) Out of facility DNR (pink MOST or yellow form)  Does patient want to make changes to medical advance directive? No - Patient declined  No - Patient declined No - Patient declined No - Patient declined No - Patient declined No - Patient declined  Pre-existing out of facility DNR order (yellow form or pink MOST form) Pink MOST form placed in chart (order not valid for inpatient use);Yellow form placed in chart (order not valid for inpatient use)  Yellow form placed in chart (order not valid for inpatient use);Pink MOST form placed in chart (order not valid for inpatient use) Yellow form placed in chart (order not valid for inpatient use);Pink MOST form placed in chart (order not valid for inpatient use) Yellow form placed in chart (order not valid for inpatient use);Pink MOST form placed in chart (order not valid for inpatient use) Yellow form placed in chart (order not valid for inpatient use);Pink MOST form placed in chart (order not valid for inpatient use)     Current Medications  (verified) Outpatient Encounter Medications as of 01/22/2022  Medication Sig   acetaminophen (TYLENOL) 500 MG tablet Take 1,000 mg by mouth 2 (two) times a day.   albuterol (PROAIR HFA) 108 (90 Base) MCG/ACT inhaler INHALE 2 PUFFS EVERY 4 HOURS AS NEEDED INTO THE LUNGS FOR WHEEZING OR SHORTNESS OF BREATH J45.20   amLODipine (NORVASC) 5 MG tablet TAKE 1 TABLET (5 MG TOTAL) BY MOUTH DAILY. I10   apixaban (ELIQUIS) 2.5 MG TABS tablet Take 1 tablet (2.5 mg total) by mouth 2 (two) times daily.   cetirizine (ZYRTEC) 10 MG tablet Take 10 mg by mouth daily.   Cholecalciferol 50 MCG (2000 UT) CAPS Take 2,000 Units by mouth in the morning and at bedtime.   esomeprazole (NEXIUM) 20 MG capsule TAKE 1 CAPSULE BY MOUTH EVERY DAY   ezetimibe (ZETIA) 10 MG tablet Take 1 tablet (10 mg total) by mouth daily.   fluticasone (FLOVENT HFA) 110 MCG/ACT inhaler Inhale 1-2 puffs into the lungs every 12 (twelve) hours.   furosemide (LASIX) 20 MG tablet Take 1 tablet (20 mg total) by mouth daily.   gabapentin (NEURONTIN) 100 MG capsule Take 3 mg by mouth at bedtime.   losartan (COZAAR) 25 MG tablet TAKE 1 TABLET BY MOUTH EVERY DAY   Omega-3 Fatty Acids (FISH OIL MAXIMUM STRENGTH) 1200 MG CAPS Take 1,200 mg by mouth 2 (two) times daily.    propranolol (INDERAL) 10  MG tablet Take 1 tablet (10 mg total) by mouth 2 (two) times daily.   Propylene Glycol (SYSTANE BALANCE) 0.6 % SOLN Place 2 drops into both eyes 2 (two) times daily.   sodium chloride (OCEAN) 0.65 % nasal spray Place 1 spray into the nose daily.   No facility-administered encounter medications on file as of 01/22/2022.    Allergies (verified) Ace inhibitors, Amoxicillin, Benicar hct [olmesartan medoxomil-hctz], Budesonide-formoterol fumarate, Chlorzoxazone, Chlorzoxazone [chlorzoxazone], Citalopram hydrobromide, Citalopram hydrobromide, Clonidine derivatives, Cymbalta [duloxetine hcl], Droperidol, Flexeril [cyclobenzaprine hcl], Fluconazole, Formoterol,  Gabapentin, Ketorolac tromethamine, Ketorolac tromethamine, Metoclopramide hcl, Mometasone furo-formoterol fum, Mometasone furoate, Morphine, Olmesartan medoxomil, and Breo ellipta [fluticasone furoate-vilanterol]   History: Past Medical History:  Diagnosis Date   (HFpEF) heart failure with preserved ejection fraction ([place]) 12/26/2021   Echocardiogram 12/2021: EF 55-60, no RWMA, Gr 2 DD, low normal RVSF, mildly elevated PASP (RVSP 38.5), mild LAE, trivial MR   ALLERGIC RHINITIS    Anxiety    Asthma    Blood type O+    Cervical strain, acute    Cirrhosis ([place])    Colon polyp 2010   adenoma   Diverticulosis of colon    Dizziness    Esophageal varices (HCC)    Fibromyalgia    GERD (gastroesophageal reflux disease)    History of nephrolithiasis    Hypercholesterolemia    borderline   Hypertension    IBS (irritable bowel syndrome)    Osteoarthritis    Personal history of allergy to unspecified medicinal agent    Postconcussion syndrome    Vitamin D deficiency    Past Surgical History:  Procedure Laterality Date   CHOLECYSTECTOMY, LAPAROSCOPIC  2004   for gallstone pancreatitis   KNEE SURGERY Right    KNEE SURGERY Left    THYROID SURGERY     cyst removed   Family History  Problem Relation Age of Onset   Breast cancer Mother    Alzheimer's disease Mother    Ovarian cancer Sister    Heart disease Sister    Breast cancer Sister 25   Alcohol abuse Sister    COPD Brother    Alcohol abuse Brother    Ovarian cancer Paternal Grandmother    Heart attack Maternal Uncle    COPD Cousin        Maternal side    Arthritis Other        Cousins    Colon cancer Neg Hx    Esophageal cancer Neg Hx    Social History   Socioeconomic History   Marital status: Divorced    Spouse name: Not on file   Number of children: 1   Years of education: Not on file   Highest education level: Not on file  Occupational History   Occupation: retired    Fish farm manager: [employer]: [employer]  Tobacco Use   Smoking status: Former    Packs/day: 2.00    Years: 8.00    Total pack years: 16.00    Types: Cigarettes    Quit date: 05/13/1962    Years since quitting: 59.7   Smokeless tobacco: Never  Vaping Use   Vaping Use: Never used  Substance and Sexual Activity   Alcohol use: No   Drug use: No   Sexual activity: Not Currently    Birth control/protection: Post-menopausal  Other Topics Concern   Not on file  Social History Narrative   exercises 3x a week   drinks 1 cup caffeine  every other day   Social Determinants of Health   Financial Resource Strain: Low Risk  (07/16/2017)   Overall Financial Resource Strain (CARDIA)    Difficulty of Paying Living Expenses: Not hard at all  Food Insecurity: No Food Insecurity (07/16/2017)   Hunger Vital Sign    Worried About Running Out of Food in the Last Year: Never true    Ran Out of Food in the Last Year: Never true  Transportation Needs: No Transportation Needs (07/16/2017)   PRAPARE - Hydrologist (Medical): No    Lack of Transportation (Non-Medical): No  Physical Activity: Inactive (07/16/2017)   Exercise Vital Sign    Days of Exercise per Week: 0 days    Minutes of Exercise per Session: 0 min  Stress: Stress Concern Present (07/16/2017)   [place]    Feeling of Stress : Rather much  Social Connections: Somewhat Isolated (07/16/2017)   Social Connection and Isolation Panel [NHANES]    Frequency of Communication with Friends and Family: More than three times a week    Frequency of Social Gatherings with Friends and Family: More than three times a week    Attends Religious Services: More than 4 times per year    Active Member of [employer] or Organizations: No    Attends Music therapist: Never    Marital Status: Divorced    Tobacco Counseling Counseling given: Not Answered   Clinical  Intake:  Pre-visit preparation completed: Yes  Pain : No/denies pain     BMI - recorded: 30  How often do you need to have someone help you when you read instructions, pamphlets, or other written materials from your doctor or pharmacy?: 1 - Never  Diabetic?yes          Activities of Daily Living    01/22/2022   10:02 AM  In your present state of health, do you have any difficulty performing the following activities:  Hearing? 1  Vision? 0  Difficulty concentrating or making decisions? 1  Comment short term memory loss  Walking or climbing stairs? 1  Comment difficulty with stairs  Dressing or bathing? 0  Doing errands, shopping? 0  Preparing Food and eating ? N  Using the Toilet? N  In the past six months, have you accidently leaked urine? Y  Do you have problems with loss of bowel control? N  Managing your Medications? N  Managing your Finances? N  Housekeeping or managing your Housekeeping? N    Patient Care Team: Lauree Chandler, NP as PCP - General (Nurse Practitioner) Burnell Blanks, MD as PCP - Cardiology (Cardiology)  Indicate any recent Medical Services you may have received from other than [facility] providers in the past year (date may be approximate).     Assessment:   This is a routine wellness examination for Ar[place]is.  Hearing/Vision screen Hearing Screening - Comments:: Patient has some issues with hearing,but not enough for a hearing aid Vision Screening - Comments:: Patient has dry eyes,but no vision problems. Patient has had annual eye exam and sees Dr. Gershon Crane  Dietary issues and exercise activities discussed: Current Exercise Habits: Home exercise routine, Type of exercise: walking, Time (Minutes): 20   Goals Addressed   None    Depression Screen    01/22/2022    9:49 AM 11/16/2021    8:21 AM 08/13/2021    9:20 AM 05/11/2021    9:35 AM 01/16/2021  9:34 AM 08/07/2020    8:50 AM 10/01/2019    8:42 AM  PHQ 2/9 Scores  PHQ - 2 Score  0 0 0 0 0 0 0    Fall Risk    01/22/2022    9:50 AM 12/21/2021   10:53 AM 11/16/2021    8:21 AM 08/13/2021    9:19 AM 05/11/2021    9:35 AM  Fall Risk   Falls in the past year? 0 0 0 0 0  Number falls in past yr: 0 0 0 0 0  Injury with Fall? 0 0 0 0 0  Risk for fall due to : No Fall Risks No Fall Risks History of fall(s) No Fall Risks No Fall Risks  Follow up Falls evaluation completed Falls evaluation completed Falls evaluation completed Falls evaluation completed Falls evaluation completed    [place]:  Any stairs in or around the home? No  If so, are there any without handrails?  na Home free of loose throw rugs in walkways, pet beds, electrical cords, etc? Yes  Adequate lighting in your home to reduce risk of falls? Yes   ASSISTIVE DEVICES UTILIZED TO PREVENT FALLS:  Life alert? No  Use of a cane, walker or w/c? Yes  Grab bars in the bathroom? Yes  Shower chair or bench in shower? Yes  Elevated toilet seat or a handicapped toilet? Yes   TIMED UP AND GO:  Was the test performed? No .    Cognitive Function:    07/16/2017   12:56 PM 07/02/2016    9:19 AM 04/04/2015   11:16 AM 09/22/2013    2:10 PM  MMSE - Mini Mental State Exam  Orientation to time 5 5 5 5   Orientation to Place 5 4 5 5   Registration 3 3 3 3   Attention/ Calculation 5 5 5 5   Recall 2 3 2 2   Language- name 2 objects 2 2 2 2   Language- repeat 1 1 1 1   Language- follow 3 step command 3 3 3 3   Language- read & follow direction 1 1 1 1   Write a sentence 1 1 1 1   Copy design 1 1 1 1   Total score 29 29 29 29         01/22/2022    9:51 AM 01/16/2021    9:39 AM 10/01/2019    8:44 AM 09/24/2018    8:46 AM  6CIT Screen  What Year? 0 points 0 points 0 points   What month? 0 points 0 points 0 points   What time? 0 points 3 points 0 points 0 points  Count back from 20 0 points 2 points 0 points 0 points  Months in reverse 0 points 0 points 0 points 2 points  Repeat phrase 2  points 8 points 0 points 0 points  Total Score 2 points 13 points 0 points     Immunizations Immunization History  Administered Date(s) Administered   Fluad Quad(high Dose 65+) 02/27/2021   Hepatitis A, Adult 10/15/2017, 04/17/2018   Influenza Split 02/11/2011, 02/25/2012   Influenza Whole 02/10/2009, 02/26/2010   Influenza, High Dose Seasonal PF 04/24/2017, 01/26/2018, 01/26/2018, 01/03/2019, 02/11/2020   Influenza,inj,Quad PF,6+ Mos 02/09/2014, 03/20/2015, 02/27/2016   Influenza-Unspecified 03/13/2013, 03/13/2017   PFIZER Comirnaty(Gray Top)Covid-19 Tri-Sucrose Vaccine 10/17/2020   PFIZER(Purple Top)SARS-COV-2 Vaccination 06/03/2019, 06/22/2019, 02/09/2020   Pneumococcal Conjugate-13 02/27/2016   Pneumococcal Polysaccharide-23 08/25/2014   Tdap 12/06/2013   Zoster Recombinat (Shingrix) 01/13/2019, 08/05/2019    TDAP  status: Up to date  Flu Vaccine status: Due, Education has been provided regarding the importance of this vaccine. Advised may receive this vaccine at local pharmacy or Health Dept. Aware to provide a copy of the vaccination record if obtained from local pharmacy or Health Dept. Verbalized acceptance and understanding.  Pneumococcal vaccine status: Up to date  Covid-19 vaccine status: Information provided on how to obtain vaccines.   Qualifies for Shingles Vaccine? Yes   Zostavax completed Yes   Shingrix Completed?: Yes  Screening Tests Health Maintenance  Topic Date Due   COVID-19 Vaccine (5 - Pfizer series) 12/12/2020   INFLUENZA VACCINE  12/11/2021   TETANUS/TDAP  12/07/2023   Pneumonia Vaccine 3+ Years old  Completed   DEXA SCAN  Completed   Zoster Vaccines- Shingrix  Completed   HPV VACCINES  Aged Out   COLONOSCOPY (Pts 45-83yr Insurance coverage will need to be confirmed)  Discontinued    Health Maintenance  Health Maintenance Due  Topic Date Due   COVID-19 Vaccine (5 - Pfizer series) 12/12/2020   INFLUENZA VACCINE  12/11/2021    Colorectal  cancer screening: No longer required.   Mammogram status: No longer required due to age.  Bone Density status: Completed 12/2019. Results reflect: Bone density results: OSTEOPENIA. Repeat every 2 years.  Lung Cancer Screening: (Low Dose CT Chest recommended if Age 83-80years, 30 pack-year currently smoking OR have quit w/in 15years.) does not qualify.   Lung Cancer Screening Referral: na  Additional Screening:  Hepatitis C Screening: does not qualify  Vision Screening: Recommended annual ophthalmology exams for early detection of glaucoma and other disorders of the eye. Is the patient up to date with their annual eye exam?  Yes  Who is the provider or what is the name of the office in which the patient attends annual eye exams? [facility] If pt is not established with a provider, would they like to be referred to a provider to establish care? No .   Dental Screening: Recommended annual dental exams for proper oral hygiene  Community Resource Referral / Chronic Care Management: CRR required this visit?  No   CCM required this visit?  No      Plan:     I have personally reviewed and noted the following in the patient's chart:   Medical and social history Use of alcohol, tobacco or illicit drugs  Current medications and supplements including opioid prescriptions. Patient is not currently taking opioid prescriptions. Functional ability and status Nutritional status Physical activity Advanced directives List of other physicians Hospitalizations, surgeries, and ER visits in previous 12 months Vitals Screenings to include cognitive, depression, and falls Referrals and appointments  In addition, I have reviewed and discussed with patient certain preventive protocols, quality metrics, and best practice recommendations. A written personalized care plan for preventive services as well as general preventive health recommendations were provided to patient.     JLauree Chandler  NP   01/22/2022    Virtual Visit via Telephone Note  I connected with patient 01/22/22 at 10:00 AM EDT by telephone and verified that I am speaking with the correct person using two identifiers.  Location: Patient: home Provider: [facility]    I discussed the limitations, risks, security and privacy concerns of performing an evaluation and management service by telephone and the availability of in person appointments. I also discussed with the patient that there may be a patient responsible charge related to this service. The patient expressed understanding and agreed  to proceed.   I discussed the assessment and treatment plan with the patient. The patient was provided an opportunity to ask questions and all were answered. The patient agreed with the plan and demonstrated an understanding of the instructions.   The patient was advised to call back or seek an in-person evaluation if the symptoms worsen or if the condition fails to improve as anticipated.  I provided 15 minutes of non-face-to-face time during this encounter.  Carlos American. Harle Battiest Avs printed and mailed

## 2022-01-22 NOTE — Patient Instructions (Signed)
[name] , Thank you for taking time to come for your Medicare Wellness Visit. I appreciate your ongoing commitment to your health goals. Please review the following plan we discussed and let me know if I can assist you in the future.   Screening recommendations/referrals: Colonoscopy aged out Mammogram aged out Bone Density due to call [phone] Recommended yearly ophthalmology/optometry visit for glaucoma screening and checkup Recommended yearly dental visit for hygiene and checkup  Vaccinations: Influenza vaccine- due annually in September/October Pneumococcal vaccine up to date Tdap vaccine up to date Shingles vaccine up to date    Advanced directives: on file.   Conditions/risks identified: advanced age, obesity  Next appointment: yearly - next in person.    Preventive Care 83 Years and Older, Female Preventive care refers to lifestyle choices and visits with your health care provider that can promote health and wellness. What does preventive care include? A yearly physical exam. This is also called an annual well check. Dental exams once or twice a year. Routine eye exams. Ask your health care provider how often you should have your eyes checked. Personal lifestyle choices, including: Daily care of your teeth and gums. Regular physical activity. Eating a healthy diet. Avoiding tobacco and drug use. Limiting alcohol use. Practicing safe sex. Taking low-dose aspirin every day. Taking vitamin and mineral supplements as recommended by your health care provider. What happens during an annual well check? The services and screenings done by your health care provider during your annual well check will depend on your age, overall health, lifestyle risk factors, and family history of disease. Counseling  Your health care provider may ask you questions about your: Alcohol use. Tobacco use. Drug use. Emotional well-being. Home and relationship well-being. Sexual  activity. Eating habits. History of falls. Memory and ability to understand (cognition). Work and work Statistician. Reproductive health. Screening  You may have the following tests or measurements: Height, weight, and BMI. Blood pressure. Lipid and cholesterol levels. These may be checked every 5 years, or more frequently if you are over 83 years old. Skin check. Lung cancer screening. You may have this screening every year starting at age 83 if you have a 30-pack-year history of smoking and currently smoke or have quit within the past 15 years. Fecal occult blood test (FOBT) of the stool. You may have this test every year starting at age 83. Flexible sigmoidoscopy or colonoscopy. You may have a sigmoidoscopy every 5 years or a colonoscopy every 10 years starting at age 83. Hepatitis C blood test. Hepatitis B blood test. Sexually transmitted disease (STD) testing. Diabetes screening. This is done by checking your blood sugar (glucose) after you have not eaten for a while (fasting). You may have this done every 1-3 years. Bone density scan. This is done to screen for osteoporosis. You may have this done starting at age 83. Mammogram. This may be done every 1-2 years. Talk to your health care provider about how often you should have regular mammograms. Talk with your health care provider about your test results, treatment options, and if necessary, the need for more tests. Vaccines  Your health care provider may recommend certain vaccines, such as: Influenza vaccine. This is recommended every year. Tetanus, diphtheria, and acellular pertussis (Tdap, Td) vaccine. You may need a Td booster every 10 years. Zoster vaccine. You may need this after age 17. Pneumococcal 13-valent conjugate (PCV13) vaccine. One dose is recommended after age 83. Pneumococcal polysaccharide (PPSV23) vaccine. One dose is recommended after age 83.  Talk to your health care provider about which screenings and vaccines  you need and how often you need them. This information is not intended to replace advice given to you by your health care provider. Make sure you discuss any questions you have with your health care provider. Document Released: 05/26/2015 Document Revised: 01/17/2016 Document Reviewed: 02/28/2015 Elsevier Interactive Patient Education  2017 [place] Prevention in the Home Falls can cause injuries. They can happen to people of all ages. There are many things you can do to make your home safe and to help prevent falls. What can I do on the outside of my home? Regularly fix the edges of walkways and driveways and fix any cracks. Remove anything that might make you trip as you walk through a door, such as a raised step or threshold. Trim any bushes or trees on the path to your home. Use bright outdoor lighting. Clear any walking paths of anything that might make someone trip, such as rocks or tools. Regularly check to see if handrails are loose or broken. Make sure that both sides of any steps have handrails. Any raised decks and porches should have guardrails on the edges. Have any leaves, snow, or ice cleared regularly. Use sand or salt on walking paths during winter. Clean up any spills in your garage right away. This includes oil or grease spills. What can I do in the bathroom? Use night lights. Install grab bars by the toilet and in the tub and shower. Do not use towel bars as grab bars. Use non-skid mats or decals in the tub or shower. If you need to sit down in the shower, use a plastic, non-slip stool. Keep the floor dry. Clean up any water that spills on the floor as soon as it happens. Remove soap buildup in the tub or shower regularly. Attach bath mats securely with double-sided non-slip rug tape. Do not have throw rugs and other things on the floor that can make you trip. What can I do in the bedroom? Use night lights. Make sure that you have a light by your bed that  is easy to reach. Do not use any sheets or blankets that are too big for your bed. They should not hang down onto the floor. Have a firm chair that has side arms. You can use this for support while you get dressed. Do not have throw rugs and other things on the floor that can make you trip. What can I do in the kitchen? Clean up any spills right away. Avoid walking on wet floors. Keep items that you use a lot in easy-to-reach places. If you need to reach something above you, use a strong step stool that has a grab bar. Keep electrical cords out of the way. Do not use floor polish or wax that makes floors slippery. If you must use wax, use non-skid floor wax. Do not have throw rugs and other things on the floor that can make you trip. What can I do with my stairs? Do not leave any items on the stairs. Make sure that there are handrails on both sides of the stairs and use them. Fix handrails that are broken or loose. Make sure that handrails are as long as the stairways. Check any carpeting to make sure that it is firmly attached to the stairs. Fix any carpet that is loose or worn. Avoid having throw rugs at the top or bottom of the stairs. If you do have throw  rugs, attach them to the floor with carpet tape. Make sure that you have a light switch at the top of the stairs and the bottom of the stairs. If you do not have them, ask someone to add them for you. What else can I do to help prevent falls? Wear shoes that: Do not have high heels. Have rubber bottoms. Are comfortable and fit you well. Are closed at the toe. Do not wear sandals. If you use a stepladder: Make sure that it is fully opened. Do not climb a closed stepladder. Make sure that both sides of the stepladder are locked into place. Ask someone to hold it for you, if possible. Clearly mark and make sure that you can see: Any grab bars or handrails. First and last steps. Where the edge of each step is. Use tools that help you  move around (mobility aids) if they are needed. These include: Canes. Walkers. Scooters. Crutches. Turn on the lights when you go into a dark area. Replace any light bulbs as soon as they burn out. Set up your furniture so you have a clear path. Avoid moving your furniture around. If any of your floors are uneven, fix them. If there are any pets around you, be aware of where they are. Review your medicines with your doctor. Some medicines can make you feel dizzy. This can increase your chance of falling. Ask your doctor what other things that you can do to help prevent falls. This information is not intended to replace advice given to you by your health care provider. Make sure you discuss any questions you have with your health care provider. Document Released: 02/23/2009 Document Revised: 10/05/2015 Document Reviewed: 06/03/2014 Elsevier Interactive Patient Education  2017 [employer].

## 2022-01-22 NOTE — Telephone Encounter (Signed)
Ms. [name], [name] are scheduled for a virtual visit with your provider today.    Just as we do with appointments in the office, we must obtain your consent to participate.  Your consent will be active for this visit and any virtual visit you may have with one of our providers in the next 365 days.    If you have a MyChart account, I can also send a copy of this consent to you electronically.  All virtual visits are billed to your insurance company just like a traditional visit in the office.  As this is a virtual visit, video technology does not allow for your provider to perform a traditional examination.  This may limit your provider's ability to fully assess your condition.  If your provider identifies any concerns that need to be evaluated in person or the need to arrange testing such as labs, EKG, etc, we will make arrangements to do so.    Although advances in technology are sophisticated, we cannot ensure that it will always work on either your end or our end.  If the connection with a video visit is poor, we may have to switch to a telephone visit.  With either a video or telephone visit, we are not always able to ensure that we have a secure connection.   I need to obtain your verbal consent now.   Are you willing to proceed with your visit today?   Halima Fogal Kraeger has provided verbal consent on 01/22/2022 for a virtual visit (video or telephone).   [name], CMA 01/22/2022  9:55 AM

## 2022-01-22 NOTE — Progress Notes (Signed)
This service is provided via telemedicine  No vital signs collected/recorded due to the encounter was a telemedicine visit.   Location of patient (ex: home, work):  Home  Patient consents to a telephone visit:  Yes, see encounter dated 01/22/2022  Location of the provider (ex: office, home):  [place]  Name of any referring provider:  N/A  Names of all persons participating in the telemedicine service and their role in the encounter:  Sherrie Mustache, Nurse Practitioner, [name], CMA, and patient.   Time spent on call:  12 minutes with medical assistant

## 2022-01-23 DIAGNOSIS — M533 Sacrococcygeal disorders, not elsewhere classified: Secondary | ICD-10-CM | POA: Diagnosis not present

## 2022-01-23 DIAGNOSIS — M461 Sacroiliitis, not elsewhere classified: Secondary | ICD-10-CM | POA: Diagnosis not present

## 2022-01-24 NOTE — Progress Notes (Deleted)
Cardiology Office Note:    Date:  01/24/2022   ID:  [name], DOB 04/24/1941, MRN [id number]  PCP:  Lauree Chandler, NP  [facility] Cardiologist:  Lauree Chandler, MD { Click to update primary MD,subspecialty MD or APP then REFRESH:1}  *** Referring MD: Lauree Chandler, NP   Chief Complaint:  No chief complaint on file. {Click here for Visit Info    :1}   Patient Profile: Paroxysmal atrial fibrillation On low-dose Eliquis due to cirrhosis/esophageal varices (HFpEF) heart failure with preserved ejection fraction  Hypertension Hyperlipidemia Anxiety Asthma Fibromyalgia IBS GERD NASH cirrhosis Esophageal varices  Prior CV Studies: ECHO COMPLETE WO IMAGING ENHANCING AGENT 01/09/2022 IMPRESSIONS 1. Left ventricular ejection fraction, by estimation, is 55 to 60%. The left ventricle has normal function. The left ventricle has no regional wall motion abnormalities. Left ventricular diastolic parameters are consistent with Grade II diastolic dysfunction (pseudonormalization). 2. Right ventricular systolic function is low normal. The right ventricular size is normal. There is mildly elevated pulmonary artery systolic pressure. The estimated right ventricular systolic pressure is 77.4 mmHg. 3. Left atrial size was mildly dilated. 4. The mitral valve is normal in structure. Trivial mitral valve regurgitation. No evidence of mitral stenosis. 5. The aortic valve is tricuspid. There is mild calcification of the aortic valve. Aortic valve regurgitation is not visualized. No aortic stenosis is present. 6. The inferior vena cava is normal in size with greater than 50% respiratory variability, suggesting right atrial pressure of 3 mmHg.  Right ventricular systolic function is low normal. There is mildly elevated pulmonary artery systolic pressure. The tricuspid regurgitant velocity is 2.98 m/s, and with an assumed right atrial pressure of 3 mmHg, the  estimated right ventricular systolic pressure is 12.8 mmHg.    ECHO COMPLETE WO IMAGING ENHANCING AGENT 11/23/2019 EF 60-65, no RWMA, normal RVSF, mildly elevated PASP (RVSP 41.4), mild to moderate LAE, trivial MR  History of Present Illness:   [name] is a 81 y.o. female with the above problem list.  She was last seen on 12/26/2021 with symptoms of chest pain, shortness of breath leg swelling and weight gain.  I started her on Lasix 20 mg daily.  Echocardiogram demonstrated normal EF.  She returns for follow-up.***       Past Medical History:  Diagnosis Date   (HFpEF) heart failure with preserved ejection fraction ([place]) 12/26/2021   Echocardiogram 12/2021: EF 55-60, no RWMA, Gr 2 DD, low normal RVSF, mildly elevated PASP (RVSP 38.5), mild LAE, trivial MR   ALLERGIC RHINITIS    Anxiety    Asthma    Blood type O+    Cervical strain, acute    Cirrhosis ([facility])    Colon polyp 2010   adenoma   Diverticulosis of colon    Dizziness    Esophageal varices (HCC)    Fibromyalgia    GERD (gastroesophageal reflux disease)    History of nephrolithiasis    Hypercholesterolemia    borderline   Hypertension    IBS (irritable bowel syndrome)    Osteoarthritis    Personal history of allergy to unspecified medicinal agent    Postconcussion syndrome    Vitamin D deficiency    Current Medications: No outpatient medications have been marked as taking for the 01/25/22 encounter (Appointment) with Richardson Dopp T, PA-C.    Allergies:   Ace inhibitors, Amoxicillin, Benicar hct [olmesartan medoxomil-hctz], Budesonide-formoterol fumarate, Chlorzoxazone, Chlorzoxazone [chlorzoxazone], Citalopram hydrobromide, Citalopram hydrobromide, Clonidine derivatives, Cymbalta [duloxetine hcl], Droperidol,  Flexeril [cyclobenzaprine hcl], Fluconazole, Formoterol, Gabapentin, Ketorolac tromethamine, Ketorolac tromethamine, Metoclopramide hcl, Mometasone furo-formoterol fum, Mometasone furoate, Morphine, Olmesartan  medoxomil, and Breo ellipta [fluticasone furoate-vilanterol]   Social History   Tobacco Use   Smoking status: Former    Packs/day: 2.00    Years: 8.00    Total pack years: 16.00    Types: Cigarettes    Quit date: 05/13/1962    Years since quitting: 59.7   Smokeless tobacco: Never  Vaping Use   Vaping Use: Never used  Substance Use Topics   Alcohol use: No   Drug use: No    Family Hx: The patient's family history includes Alcohol abuse in her brother and sister; Alzheimer's disease in her mother; Arthritis in an other family member; Breast cancer in her mother; Breast cancer (age of onset: 67) in her sister; COPD in her brother and cousin; Heart attack in her maternal uncle; Heart disease in her sister; Ovarian cancer in her paternal grandmother and sister. There is no history of Colon cancer or Esophageal cancer.  ROS   EKGs/Labs/Other Test Reviewed:    EKG:  EKG is *** ordered today.  The ekg ordered today demonstrates ***  Recent Labs: 12/21/2021: ALT 17; Hemoglobin 14.6; Platelets 301 01/02/2022: BUN 15; Creatinine, Ser 0.75; NT-Pro BNP 97; Potassium 4.2; Sodium 140   Recent Lipid Panel Recent Labs    11/16/21 0839  CHOL 212*  TRIG 143  HDL 51  LDLCALC 134*     Risk Assessment/Calculations/Metrics:   {Does this patient have ATRIAL FIBRILLATION?:[id number]}     No BP recorded.  {Refresh Note OR Click here to enter BP  :1}***    Physical Exam:    VS:  There were no vitals taken for this visit.    Wt Readings from Last 3 Encounters:  12/26/21 178 lb 3.2 oz (80.8 kg)  12/21/21 180 lb 6.4 oz (81.8 kg)  11/16/21 175 lb 6.4 oz (79.6 kg)    Physical Exam ***     ASSESSMENT & PLAN:   No problem-specific Assessment & Plan notes found for this encounter.  *** Acute heart failure with preserved ejection fraction (HFpEF) (HCC) Echocardiogram in July 2021 with normal ejection fraction.  She presents with weight gain, leg swelling and worsening shortness of breath with  exertion over the past 2 weeks.  She admits to a diet high in salt.  She has faint rales in her lung bases bilaterally.  I suspect her shortness of breath is all related to volume excess.  She does not take diuretic therapy.  Start furosemide 20 mg daily.  Obtain follow-up BMET, BNP in 1 week.  Arrange follow-up echocardiogram.  Low-sodium diet.  Follow-up with Dr. Angelena Form or me in 4 weeks.   Precordial chest pain She has not had chest pain or pressure or exertional symptoms.  She mainly describes a vibratory sensation.  She is not really describing palpitations.  Question if this is all related to volume excess.  Proceed with echocardiogram as noted.  If she continues to have symptoms despite improved volume status, consider stress testing.  If she has symptoms more consistent with palpitations, consider event monitor.   Atrial fibrillation (HCC) Maintaining sinus rhythm.  Her weight is greater than 60 kg and her creatinine is less than 1.5.  I am not sure why she is on low-dose Eliquis.  I have reviewed her chart.  Question if it may be related to a history of cirrhosis with esophageal varices.  I will review further  with Dr. Angelena Form.  For now, continue Eliquis 2.5 mg twice daily.  Recent creatinine, hemoglobin normal.   Essential hypertension The patient's blood pressure is controlled on her current regimen.  Continue current therapy.  ***      {Are you ordering a CV Procedure (e.g. stress test, cath, DCCV, TEE, etc)?   Press F2        :[id number]}   Dispo:  No follow-ups on file.   Medication Adjustments/Labs and Tests Ordered: Current medicines are reviewed at length with the patient today.  Concerns regarding medicines are outlined above.  Tests Ordered: No orders of the defined types were placed in this encounter.  Medication Changes: No orders of the defined types were placed in this encounter.  Signed, Richardson Dopp, PA-C  01/24/2022 10:26 PM    [address], [place], [place]  [address] Phone: [phone]; Fax: [phone]

## 2022-01-25 ENCOUNTER — Ambulatory Visit: Payer: PPO | Attending: Physician Assistant | Admitting: Physician Assistant

## 2022-01-25 DIAGNOSIS — I5032 Chronic diastolic (congestive) heart failure: Secondary | ICD-10-CM

## 2022-01-25 DIAGNOSIS — I480 Paroxysmal atrial fibrillation: Secondary | ICD-10-CM

## 2022-01-30 ENCOUNTER — Ambulatory Visit
Admission: RE | Admit: 2022-01-30 | Discharge: 2022-01-30 | Disposition: A | Discharge status: Home / Self Care | Payer: PPO | Source: Ambulatory Visit | Attending: Nurse Practitioner | Admitting: Nurse Practitioner

## 2022-01-30 DIAGNOSIS — Z1231 Encounter for screening mammogram for malignant neoplasm of breast: Secondary | ICD-10-CM | POA: Diagnosis not present

## 2022-02-11 ENCOUNTER — Other Ambulatory Visit: Payer: Self-pay | Admitting: Gastroenterology

## 2022-02-11 DIAGNOSIS — K219 Gastro-esophageal reflux disease without esophagitis: Secondary | ICD-10-CM

## 2022-02-13 DIAGNOSIS — M545 Low back pain, unspecified: Secondary | ICD-10-CM | POA: Diagnosis not present

## 2022-02-13 DIAGNOSIS — M4120 Other idiopathic scoliosis, site unspecified: Secondary | ICD-10-CM | POA: Diagnosis not present

## 2022-02-13 DIAGNOSIS — M461 Sacroiliitis, not elsewhere classified: Secondary | ICD-10-CM | POA: Diagnosis not present

## 2022-02-18 ENCOUNTER — Telehealth: Payer: Self-pay

## 2022-02-18 DIAGNOSIS — M25561 Pain in right knee: Secondary | ICD-10-CM | POA: Diagnosis not present

## 2022-02-18 DIAGNOSIS — I10 Essential (primary) hypertension: Secondary | ICD-10-CM | POA: Diagnosis not present

## 2022-02-18 DIAGNOSIS — M549 Dorsalgia, unspecified: Secondary | ICD-10-CM | POA: Diagnosis not present

## 2022-02-18 DIAGNOSIS — D692 Other nonthrombocytopenic purpura: Secondary | ICD-10-CM | POA: Diagnosis not present

## 2022-02-18 DIAGNOSIS — M25562 Pain in left knee: Secondary | ICD-10-CM | POA: Diagnosis not present

## 2022-02-18 DIAGNOSIS — Z6833 Body mass index (BMI) 33.0-33.9, adult: Secondary | ICD-10-CM | POA: Diagnosis not present

## 2022-02-18 DIAGNOSIS — Z515 Encounter for palliative care: Secondary | ICD-10-CM | POA: Diagnosis not present

## 2022-02-18 DIAGNOSIS — G8929 Other chronic pain: Secondary | ICD-10-CM | POA: Diagnosis not present

## 2022-02-18 NOTE — Telephone Encounter (Signed)
Patient called requesting samples of eliquis 2.65m tablet. We have Eliquis 553mtablet is it ok for her to cut these in half.  Message routed to JeSherrie MustacheNP

## 2022-02-18 NOTE — Telephone Encounter (Signed)
Can not cut medication

## 2022-02-18 NOTE — Telephone Encounter (Signed)
I called patient back and informed her that she could not cut pill in half. She verbalized her understanding.

## 2022-02-26 ENCOUNTER — Telehealth: Payer: Self-pay

## 2022-02-26 NOTE — Telephone Encounter (Signed)
Patient called and states that she is in the donut hole with insurance. She states that Advair was a medication she was using but cannot afford it right now. She states that pharmacy [facility] [address] gave her alternative options to be sent in. The names of those medications are Wixela, and Fluticacone. Message routed to PCP Dewaine Oats, Carlos American, NP

## 2022-02-26 NOTE — Telephone Encounter (Signed)
Patient cant afford Advair right now. States she used it when she needed it but needs alternative as stated in earlier message. Please Advise.

## 2022-02-26 NOTE — Telephone Encounter (Signed)
It looks like Advair has been off of her list since April. Had she still been taking this?

## 2022-02-27 MED ORDER — FLUTICASONE-SALMETEROL 250-50 MCG/ACT IN AEPB: 1.0000 | INHALATION_SPRAY | Freq: Two times a day (BID) | RESPIRATORY_TRACT | 2 refills | Status: DC

## 2022-02-27 NOTE — Telephone Encounter (Signed)
Rx sent in for [facility]

## 2022-02-28 NOTE — Telephone Encounter (Signed)
Thank you, patient called and notified.

## 2022-03-08 ENCOUNTER — Other Ambulatory Visit: Payer: Self-pay | Admitting: Gastroenterology

## 2022-03-08 DIAGNOSIS — K219 Gastro-esophageal reflux disease without esophagitis: Secondary | ICD-10-CM

## 2022-03-19 ENCOUNTER — Other Ambulatory Visit: Payer: Self-pay | Admitting: Cardiovascular Disease

## 2022-03-19 NOTE — Telephone Encounter (Signed)
Prescription refill request for Eliquis received. Indication:afib Last office visit:8/23 Scr:0.7 Age: 81 Weight:80.8  kg  Prescription refilled

## 2022-03-21 ENCOUNTER — Telehealth (INDEPENDENT_AMBULATORY_CARE_PROVIDER_SITE_OTHER): Payer: PPO | Admitting: Nurse Practitioner

## 2022-03-21 DIAGNOSIS — J4520 Mild intermittent asthma, uncomplicated: Secondary | ICD-10-CM | POA: Diagnosis not present

## 2022-03-21 DIAGNOSIS — R059 Cough, unspecified: Secondary | ICD-10-CM

## 2022-03-21 MED ORDER — ALBUTEROL SULFATE HFA 108 (90 BASE) MCG/ACT IN AERS: INHALATION_SPRAY | RESPIRATORY_TRACT | 5 refills | Status: DC

## 2022-03-21 MED ORDER — FLUTICASONE-SALMETEROL 115-21 MCG/ACT IN AERO: 2.0000 | INHALATION_SPRAY | Freq: Two times a day (BID) | RESPIRATORY_TRACT | 12 refills | Status: DC

## 2022-03-21 NOTE — Progress Notes (Signed)
Careteam: Patient Care Team: Lauree Chandler, NP as PCP - General (Nurse Practitioner) Burnell Blanks, MD as PCP - Cardiology (Cardiology)  Advanced Directive information    Allergies  Allergen Reactions   Delfin Edis [Fluticasone-Salmeterol] Shortness Of Breath   Ace Inhibitors     cough   Amoxicillin Itching    REACTION: unknown? pain in right kidney   Benicar Hct [Olmesartan Medoxomil-Hctz]     Extreme weakness   Budesonide-Formoterol Fumarate     Causes tremors and numbness   Chlorzoxazone Hives   Chlorzoxazone [Chlorzoxazone]    Citalopram Hydrobromide     REACTION: hives   Citalopram Hydrobromide    Clonidine Derivatives    Cymbalta [Duloxetine Hcl]    Droperidol     REACTION: hives   Flexeril [Cyclobenzaprine Hcl]    Fluconazole Nausea And Vomiting and Other (See Comments)    fatigue   Formoterol Itching   Gabapentin Itching   Ketorolac Tromethamine     REACTION: hives   Ketorolac Tromethamine    Metoclopramide Hcl    Mometasone Furo-Formoterol Fum     Causes sore throat, blurred vision and unable to sleep--started on med 09-17-10   Mometasone Furoate Itching   Morphine     REACTION: hives and itching   Olmesartan Medoxomil     REACTION: fatique   Breo Ellipta [Fluticasone Furoate-Vilanterol] Itching    Pt reports itching with [place] is related to being lactose intolerant.     Chief Complaint  Patient presents with   Acute Visit    Allergic reaction to inhaler. Patient had reaction to generic advair. Patient having difficulty breathing. Patient had weakness, very sick. She took benadryl and it took about 3 days to get it out of her system.      HPI: Patient is a 81 y.o. female via telephone visit.  She has had allergies, hay fever and sinuses all her life but has been doing great for a while so she stopped taking advair but then started having shortness of breath.  She restarted this and 5 days later started to have weakness and felt  sick. Took benadryl and it improved. Felt like it was an allergy attack.  She wants to go to name brand advair.    Review of Systems:  Review of Systems  Constitutional:  Negative for chills, fever and weight loss.  HENT:  Negative for tinnitus.   Respiratory:  Positive for cough and shortness of breath. Negative for sputum production and wheezing.   Cardiovascular:  Negative for chest pain, palpitations and leg swelling.  Gastrointestinal:  Negative for abdominal pain, constipation, diarrhea and heartburn.  Genitourinary:  Negative for dysuria, frequency and urgency.  Skin: Negative.     Past Medical History:  Diagnosis Date   (HFpEF) heart failure with preserved ejection fraction ([place]) 12/26/2021   Echocardiogram 12/2021: EF 55-60, no RWMA, Gr 2 DD, low normal RVSF, mildly elevated PASP (RVSP 38.5), mild LAE, trivial MR   ALLERGIC RHINITIS    Anxiety    Asthma    Blood type O+    Cervical strain, acute    Cirrhosis ([place])    Colon polyp 2010   adenoma   Diverticulosis of colon    Dizziness    Esophageal varices (HCC)    Fibromyalgia    GERD (gastroesophageal reflux disease)    History of nephrolithiasis    Hypercholesterolemia    borderline   Hypertension    IBS (irritable bowel syndrome)    Osteoarthritis  Personal history of allergy to unspecified medicinal agent    Postconcussion syndrome    Vitamin D deficiency    Past Surgical History:  Procedure Laterality Date   CHOLECYSTECTOMY, LAPAROSCOPIC  2004   for gallstone pancreatitis   KNEE SURGERY Right    KNEE SURGERY Left    THYROID SURGERY     cyst removed   Social History:   reports that she quit smoking about 59 years ago. Her smoking use included cigarettes. She has a 16.00 pack-year smoking history. She has never used smokeless tobacco. She reports that she does not drink alcohol and does not use drugs.  Family History  Problem Relation Age of Onset   Breast cancer Mother    Alzheimer's disease Mother     Ovarian cancer Sister    Heart disease Sister    Breast cancer Sister 35   Alcohol abuse Sister    COPD Brother    Alcohol abuse Brother    Ovarian cancer Paternal Grandmother    Heart attack Maternal Uncle    COPD Cousin        Maternal side    Arthritis Other        Cousins    Colon cancer Neg Hx    Esophageal cancer Neg Hx     Medications: Patient's Medications  New Prescriptions   No medications on file  Previous Medications   ACETAMINOPHEN (TYLENOL) 500 MG TABLET    Take 1,000 mg by mouth 2 (two) times a day.   ALBUTEROL (PROAIR HFA) 108 (90 BASE) MCG/ACT INHALER    INHALE 2 PUFFS EVERY 4 HOURS AS NEEDED INTO THE LUNGS FOR WHEEZING OR SHORTNESS OF BREATH J45.20   AMLODIPINE (NORVASC) 5 MG TABLET    TAKE 1 TABLET (5 MG TOTAL) BY MOUTH DAILY. I10   CETIRIZINE (ZYRTEC) 10 MG TABLET    Take 10 mg by mouth daily.   CHOLECALCIFEROL 50 MCG (2000 UT) CAPS    Take 2,000 Units by mouth in the morning and at bedtime.   ELIQUIS 2.5 MG TABS TABLET    TAKE 1 TABLET BY MOUTH TWICE A DAY   ESOMEPRAZOLE (NEXIUM) 20 MG CAPSULE    Take 1 capsule (20 mg total) by mouth daily. Please schedule a follow up appointment for further refills. Thank you   EZETIMIBE (ZETIA) 10 MG TABLET    Take 1 tablet (10 mg total) by mouth daily.   FLUTICASONE (FLOVENT HFA) 110 MCG/ACT INHALER    Inhale 1-2 puffs into the lungs every 12 (twelve) hours.   FUROSEMIDE (LASIX) 20 MG TABLET    Take 1 tablet (20 mg total) by mouth daily.   GABAPENTIN (NEURONTIN) 100 MG CAPSULE    Take 3 mg by mouth at bedtime.   LOSARTAN (COZAAR) 25 MG TABLET    TAKE 1 TABLET BY MOUTH EVERY DAY   OMEGA-3 FATTY ACIDS (FISH OIL MAXIMUM STRENGTH) 1200 MG CAPS    Take 1,200 mg by mouth 2 (two) times daily.    PROPRANOLOL (INDERAL) 10 MG TABLET    Take 1 tablet (10 mg total) by mouth 2 (two) times daily.   PROPYLENE GLYCOL (SYSTANE BALANCE) 0.6 % SOLN    Place 2 drops into both eyes 2 (two) times daily.   SODIUM CHLORIDE (OCEAN) 0.65 % NASAL  SPRAY    Place 1 spray into the nose daily.  Modified Medications   No medications on file  Discontinued Medications   FLUTICASONE-SALMETEROL (WIXELA INHUB) 250-50 MCG/ACT AEPB  Inhale 1 puff into the lungs in the morning and at bedtime.    Physical Exam:  There were no vitals filed for this visit. There is no height or weight on file to calculate BMI. Wt Readings from Last 3 Encounters:  12/26/21 178 lb 3.2 oz (80.8 kg)  12/21/21 180 lb 6.4 oz (81.8 kg)  11/16/21 175 lb 6.4 oz (79.6 kg)      Labs reviewed: Basic Metabolic Panel: Recent Labs    09/28/21 0845 12/21/21 1149 01/02/22 1104  NA 139 142 140  K 4.5 4.3 4.2  CL 106 106 102  CO2 26 28 25   GLUCOSE 110* 98 99  BUN 16 14 15   CREATININE 0.66 0.64 0.75  CALCIUM 9.5 9.5 9.8   Liver Function Tests: Recent Labs    05/08/21 0921 08/13/21 0942 12/21/21 1149  AST 21 22 24   ALT 15 15 17   BILITOT 1.0 0.8 0.6  PROT 7.0 6.7 6.6   No results for input(s): "LIPASE", "AMYLASE" in the last 8760 hours. No results for input(s): "AMMONIA" in the last 8760 hours. CBC: Recent Labs    08/13/21 0942 09/28/21 0845 12/21/21 1149  WBC 6.8 7.1 8.1  NEUTROABS 2,271 3.2 3,588  HGB 14.1 14.2 14.6  HCT 41.9 42.5 42.5  MCV 88.6 90.4 89.1  PLT 282 248 301   Lipid Panel: Recent Labs    05/08/21 0921 11/16/21 0839  CHOL 239* 212*  HDL 66 51  LDLCALC 148* 134*  TRIG 124 143  CHOLHDL 3.6 4.2   TSH: No results for input(s): "TSH" in the last 8760 hours. A1C: Lab Results  Component Value Date   HGBA1C 5.5 11/06/2020     Assessment/Plan 1. Asthma, allergic, mild intermittent, uncomplicated Sensitive to different brands, took advair without adverse reaction, new Rx sent - fluticasone-salmeterol (ADVAIR HFA) 115-21 MCG/ACT inhaler; Inhale 2 puffs into the lungs 2 (two) times daily.  Dispense: 1 each; Refill: 12 - albuterol (PROAIR HFA) 108 (90 Base) MCG/ACT inhaler; INHALE 2 PUFFS EVERY 4 HOURS AS NEEDED INTO THE  LUNGS FOR WHEEZING OR SHORTNESS OF BREATH J45.20  Dispense: 25.5 g; Refill: 5  2. Cough Due to asthma.  - albuterol (PROAIR HFA) 108 (90 Base) MCG/ACT inhaler; INHALE 2 PUFFS EVERY 4 HOURS AS NEEDED INTO THE LUNGS FOR WHEEZING OR SHORTNESS OF BREATH J45.20  Dispense: 25.5 g; Refill: 5  Strict follow up precautions discussed Jonuel Butterfield K. Harle Battiest  [facility] [phone]    Virtual Visit via telephone  I connected with patient on 03/21/22 at  9:20 AM EST by telephone and verified that I am speaking with the correct person using two identifiers.  Location: Patient: home Provider: [place]   I discussed the limitations, risks, security and privacy concerns of performing an evaluation and management service by telephone and the availability of in person appointments. I also discussed with the patient that there may be a patient responsible charge related to this service. The patient expressed understanding and agreed to proceed.   I discussed the assessment and treatment plan with the patient. The patient was provided an opportunity to ask questions and all were answered. The patient agreed with the plan and demonstrated an understanding of the instructions.   The patient was advised to call back or seek an in-person evaluation if the symptoms worsen or if the condition fails to improve as anticipated.  I provided 14 minutes of non-face-to-face time during this encounter.  Carlos American. Harle Battiest  Avs printed and mailed

## 2022-03-21 NOTE — Progress Notes (Signed)
This service is provided via telemedicine  No vital signs collected/recorded due to the encounter was a telemedicine visit.   Location of patient (ex: home, work):  Home  Patient consents to a telephone visit:  Yes, see encounter dated 01/22/2022  Location of the provider (ex: office, home):  [place]  Name of any referring provider:  N/A  Names of all persons participating in the telemedicine service and their role in the encounter:  Sherrie Mustache, Nurse Practitioner, [name], CMA, and patient.   Time spent on call:  8 minutes with medical assistant

## 2022-03-22 ENCOUNTER — Ambulatory Visit: Payer: PPO | Admitting: Nurse Practitioner

## 2022-03-22 ENCOUNTER — Telehealth: Payer: Self-pay

## 2022-03-22 NOTE — Telephone Encounter (Signed)
Prior authorization request received for Advair. Prior authorization started via covermymeds.

## 2022-03-27 ENCOUNTER — Other Ambulatory Visit: Payer: Self-pay | Admitting: Nurse Practitioner

## 2022-03-27 DIAGNOSIS — I10 Essential (primary) hypertension: Secondary | ICD-10-CM

## 2022-03-30 ENCOUNTER — Emergency Department (HOSPITAL_COMMUNITY)
Admission: EM | Admit: 2022-03-30 | Discharge: 2022-03-30 | Disposition: A | Discharge status: Home / Self Care | Payer: PPO | Attending: Emergency Medicine | Admitting: Emergency Medicine

## 2022-03-30 ENCOUNTER — Other Ambulatory Visit: Payer: Self-pay

## 2022-03-30 ENCOUNTER — Emergency Department (HOSPITAL_COMMUNITY): Payer: PPO

## 2022-03-30 DIAGNOSIS — Z1152 Encounter for screening for COVID-19: Secondary | ICD-10-CM | POA: Insufficient documentation

## 2022-03-30 DIAGNOSIS — Z79899 Other long term (current) drug therapy: Secondary | ICD-10-CM | POA: Diagnosis not present

## 2022-03-30 DIAGNOSIS — I10 Essential (primary) hypertension: Secondary | ICD-10-CM | POA: Insufficient documentation

## 2022-03-30 DIAGNOSIS — J45909 Unspecified asthma, uncomplicated: Secondary | ICD-10-CM | POA: Diagnosis not present

## 2022-03-30 DIAGNOSIS — R0789 Other chest pain: Secondary | ICD-10-CM | POA: Diagnosis not present

## 2022-03-30 DIAGNOSIS — J441 Chronic obstructive pulmonary disease with (acute) exacerbation: Secondary | ICD-10-CM | POA: Diagnosis not present

## 2022-03-30 DIAGNOSIS — I480 Paroxysmal atrial fibrillation: Secondary | ICD-10-CM | POA: Insufficient documentation

## 2022-03-30 DIAGNOSIS — Z7951 Long term (current) use of inhaled steroids: Secondary | ICD-10-CM | POA: Diagnosis not present

## 2022-03-30 DIAGNOSIS — R0602 Shortness of breath: Secondary | ICD-10-CM | POA: Diagnosis not present

## 2022-03-30 DIAGNOSIS — Z7901 Long term (current) use of anticoagulants: Secondary | ICD-10-CM | POA: Diagnosis not present

## 2022-03-30 LAB — BASIC METABOLIC PANEL
Anion gap: 10 (ref 5–15)
BUN: 18 mg/dL (ref 8–23)
CO2: 26 mmol/L (ref 22–32)
Calcium: 9.5 mg/dL (ref 8.9–10.3)
Chloride: 101 mmol/L (ref 98–111)
Creatinine, Ser: 0.7 mg/dL (ref 0.44–1.00)
GFR, Estimated: >60 mL/min (ref >60)
Glucose, Bld: 103 mg/dL — ABNORMAL HIGH (ref 70–99)
Potassium: 4.2 mmol/L (ref 3.5–5.1)
Sodium: 137 mmol/L (ref 135–145)

## 2022-03-30 LAB — RESP PANEL BY RT-PCR (FLU A&B, COVID) ARPGX2
Influenza A by PCR: NEGATIVE
Influenza B by PCR: NEGATIVE
SARS Coronavirus 2 by RT PCR: NEGATIVE

## 2022-03-30 LAB — CBC
HCT: 41.6 % (ref 36.0–46.0)
Hemoglobin: 13.9 g/dL (ref 12.0–15.0)
MCH: 29.8 pg (ref 26.0–34.0)
MCHC: 33.4 g/dL (ref 30.0–36.0)
MCV: 89.3 fL (ref 80.0–100.0)
Platelets: 327 10*3/uL (ref 150–400)
RBC: 4.66 MIL/uL (ref 3.87–5.11)
RDW: 13 % (ref 11.5–15.5)
WBC: 8.6 10*3/uL (ref 4.0–10.5)
nRBC: 0 % (ref 0.0–0.2)

## 2022-03-30 LAB — BRAIN NATRIURETIC PEPTIDE: B Natriuretic Peptide: 44.1 pg/mL (ref 0.0–100.0)

## 2022-03-30 LAB — TROPONIN I (HIGH SENSITIVITY)
Troponin I (High Sensitivity): 5 ng/L (ref <18)
Troponin I (High Sensitivity): 5 ng/L (ref <18)

## 2022-03-30 MED ORDER — PREDNISONE 20 MG PO TABS: 40.0000 mg | ORAL_TABLET | Freq: Every day | ORAL | 0 refills | Status: AC

## 2022-03-30 MED ORDER — PREDNISONE 20 MG PO TABS
60.0000 mg | ORAL_TABLET | Freq: Once | ORAL | Status: AC
  Administered 2022-03-30: 60 mg via ORAL
  Filled 2022-03-30: qty 3

## 2022-03-30 MED ORDER — ALBUTEROL SULFATE (2.5 MG/3ML) 0.083% IN NEBU
5.0000 mg | INHALATION_SOLUTION | Freq: Once | RESPIRATORY_TRACT | Status: AC
  Administered 2022-03-30: 5 mg via RESPIRATORY_TRACT
  Filled 2022-03-30: qty 6

## 2022-03-30 MED ORDER — IPRATROPIUM-ALBUTEROL 0.5-2.5 (3) MG/3ML IN SOLN
3.0000 mL | Freq: Once | RESPIRATORY_TRACT | Status: AC
  Administered 2022-03-30: 3 mL via RESPIRATORY_TRACT
  Filled 2022-03-30: qty 3

## 2022-03-30 MED ORDER — ALBUTEROL SULFATE (2.5 MG/3ML) 0.083% IN NEBU
5.0000 mg | INHALATION_SOLUTION | Freq: Once | RESPIRATORY_TRACT | Status: DC
  Filled 2022-03-30: qty 6

## 2022-03-30 NOTE — ED Triage Notes (Signed)
Patient here with complaint of shortness of breath and chest pain that started that started approximately one week ago. Patient states she received a letter at the beginning of the year stating that her insurance would no longer cover her Advair prescription and she has been prescribed a variety of other alternative medications that were covered by insurance until two days ago when the Advair was approved again. Reports having normal echocardiogram one month ago. Patient is alert, oriented, speaking in complete sentences, and is in no apparent distress at this time.

## 2022-03-30 NOTE — ED Provider Notes (Signed)
Care of patient handed off to me by Dorise Bullion, PA-C.  Please see her note for work-up so far.  Briefly, this is an 81 year old female who presents to the emergency department with shortness of breath and chest pressure.  Has history of hypertension, paroxysmal atrial fibrillation, hypertension, asthma.  Symptoms have been worsening over the past week and worsen with exertion.  No fever, chills, abdominal pain, nausea, vomiting, diarrhea.  Patient also noted that over the past 1 to 2 months her Advair was no longer covered by insurance.  She had been trying several alternative agents without relief.  She was notified 2 days ago that her Advair was covered on her insurance and has recently restarted this medication. Physical Exam  BP (!) 151/54   Pulse 63   Temp 98.7 F (37.1 C) (Oral)   Resp 18   SpO2 98%   Physical Exam Vitals and nursing note reviewed.  HENT:     Head: Normocephalic and atraumatic.     Mouth/Throat:     Mouth: Mucous membranes are moist.  Eyes:     General: No scleral icterus.    Pupils: Pupils are equal, round, and reactive to light.  Cardiovascular:     Rate and Rhythm: Normal rate and regular rhythm.     Pulses: Normal pulses.     Heart sounds: No murmur heard. Pulmonary:     Effort: Pulmonary effort is normal. No respiratory distress.     Breath sounds: Decreased breath sounds present.  Chest:     Chest wall: No tenderness.  Abdominal:     Palpations: Abdomen is soft.  Musculoskeletal:        General: Normal range of motion.     Cervical back: Normal range of motion.     Right lower leg: No tenderness.     Left lower leg: No tenderness.  Skin:    General: Skin is warm and dry.     Capillary Refill: Capillary refill takes less than 2 seconds.     Findings: No rash.  Neurological:     General: No focal deficit present.     Mental Status: She is alert and oriented to person, place, and time.  Psychiatric:        Mood and Affect: Mood normal.         Behavior: Behavior normal.        Thought Content: Thought content normal.        Judgment: Judgment normal.    Procedures  Procedures  ED Course / MDM    Medical Decision Making Amount and/or Complexity of Data Reviewed Labs: ordered. Radiology: ordered.  Risk Prescription drug management.   Labs were ordered including a CBC with no acute findings including anemia, BMP with no acute findings.  Troponin x2 is negative and her EKG shows no evidence of ACS.  Doubt ACS as a cause of her symptoms.  Chest x-ray also with no acute findings to include pneumonia, pneumothorax, pleural effusion, pulmonary edema.  She was given an albuterol nebulizer as well as DuoNeb with mild improvement in symptoms.  Plan to administer 1 more DuoNeb, steroid and reassess. Additionally, I added on a COVID/flu swab and BNP as it seems her SOB was exertional over the past 1 week.  Reassessment with ongoing improvement. She no longer is feeling short of breath or having chest pain. BNP is negative and she does not appear to be volume overloaded. Doubt CHF exacerbation. Covid and flu is negative. She has inhalers  at home. Will prescribe her 4 more days of prednisone for COPD exacerbation. Given strict return precautions for worsening shortness of breath or chest pain. F/u with pcp and cardiology.   I discussed this case with my attending physician who cosigned this note including patient's presenting symptoms, physical exam, and planned diagnostics and interventions. Attending physician stated agreement with plan or made changes to plan which were implemented.      Mickie Hillier, PA-C 03/13/27 1188    Lianne Cure, DO 67/73/73 [phone]

## 2022-03-30 NOTE — ED Notes (Signed)
Patient verbalizes understanding of discharge instructions. Opportunity for questioning and answers were provided. Pt discharged from ED. 

## 2022-03-30 NOTE — Discharge Instructions (Addendum)
You were seen in the emergency department today for shortness of breath and chest pain.  Your labs here were reassuring as well as your chest x-ray and EKG.  We gave you some breathing treatments with improvement in your symptoms.  I am discharging you with 4 more days of prednisone.  Continue taking your home inhalers as prescribed.  Please follow-up with your primary care provider in about 1 week regarding your symptoms.  Additionally please call your cardiologist on Monday morning to schedule follow-up appointment regarding your chest pain.  Please return to the emergency department for any worsening symptoms.

## 2022-03-30 NOTE — ED Provider Notes (Signed)
[facility] DEPARTMENT Provider Note   CSN: [id number] Arrival date & time: 03/30/22  1157     History  Chief Complaint  Patient presents with   Shortness of Breath    [name] is a 81 y.o. female with Hx of fibromyalgia, Dupuytren's, asthma, chronic bronchitis, essential HTN, GERD, seasonal allergic rhinitis, esophageal varices, paroxysmal A-fib, anxiety, and diverticulosis.  Presenting to the ED today due to worsening shortness of breath and chest pressure of the last week.  Shortness of breath worsens with exertion.  Pressure on the chest comes and goes, again worsened with exertion.  Also occasionally accompanied with chest pain, which is worsened with cough.  Denies fever, chills, abdominal pain, N/V/D, headache, vision changes.  Though does endorse some mild congestion.  Reports extensive list of allergies.  Also states within the last 1 to 2 months, her Advair was no longer covered by insurance.  Tried several different alternative agents without relief.  Notified 2 days ago that her Advair was now covered under insurance again, recently started this back up yesterday.  The history is provided by the patient and medical records.  Shortness of Breath    Home Medications Prior to Admission medications   Medication Sig Start Date End Date Taking? Authorizing Provider  acetaminophen (TYLENOL) 500 MG tablet Take 1,000 mg by mouth 2 (two) times a day.    [provider]  albuterol (PROAIR HFA) 108 (90 Base) MCG/ACT inhaler INHALE 2 PUFFS EVERY 4 HOURS AS NEEDED INTO THE LUNGS FOR WHEEZING OR SHORTNESS OF BREATH J45.20 03/21/22   Lauree Chandler, NP  amLODipine (NORVASC) 5 MG tablet TAKE 1 TABLET (5 MG TOTAL) BY MOUTH DAILY 03/27/22   Lauree Chandler, NP  cetirizine (ZYRTEC) 10 MG tablet Take 10 mg by mouth daily.    [provider]  Cholecalciferol 50 MCG (2000 UT) CAPS Take 2,000 Units by mouth in the morning and at bedtime.     [provider]  ELIQUIS 2.5 MG TABS tablet TAKE 1 TABLET BY MOUTH TWICE A DAY 03/19/22   Burnell Blanks, MD  esomeprazole (NEXIUM) 20 MG capsule Take 1 capsule (20 mg total) by mouth daily. Please schedule a follow up appointment for further refills. Thank you 02/11/22   Yetta Flock, MD  ezetimibe (ZETIA) 10 MG tablet Take 1 tablet (10 mg total) by mouth daily. 11/16/21   Lauree Chandler, NP  fluticasone (FLOVENT HFA) 110 MCG/ACT inhaler Inhale 1-2 puffs into the lungs every 12 (twelve) hours. 08/14/21   Lauree Chandler, NP  fluticasone-salmeterol (ADVAIR HFA) 449-67 MCG/ACT inhaler Inhale 2 puffs into the lungs 2 (two) times daily. 03/21/22   Lauree Chandler, NP  furosemide (LASIX) 20 MG tablet Take 1 tablet (20 mg total) by mouth daily. 12/26/21   Richardson Dopp T, PA-C  gabapentin (NEURONTIN) 100 MG capsule Take 3 mg by mouth at bedtime.    [provider]  losartan (COZAAR) 25 MG tablet TAKE 1 TABLET BY MOUTH EVERY DAY 03/26/21   Lauree Chandler, NP  Omega-3 Fatty Acids (FISH OIL MAXIMUM STRENGTH) 1200 MG CAPS Take 1,200 mg by mouth 2 (two) times daily.     [provider]  propranolol (INDERAL) 10 MG tablet Take 1 tablet (10 mg total) by mouth 2 (two) times daily. 08/13/21   Lauree Chandler, NP  Propylene Glycol (SYSTANE BALANCE) 0.6 % SOLN Place 2 drops into both eyes 2 (two) times daily. 07/10/20  Lauree Chandler, NP  sodium chloride (OCEAN) 0.65 % nasal spray Place 1 spray into the nose daily.    [provider]      Allergies    Wixela inhub [fluticasone-salmeterol], Ace inhibitors, Amoxicillin, Benicar hct [olmesartan medoxomil-hctz], Budesonide-formoterol fumarate, Chlorzoxazone, Chlorzoxazone [chlorzoxazone], Citalopram hydrobromide, Citalopram hydrobromide, Clonidine derivatives, Cymbalta [duloxetine hcl], Droperidol, Flexeril [cyclobenzaprine hcl], Fluconazole, Formoterol, Gabapentin, Ketorolac tromethamine, Ketorolac  tromethamine, Metoclopramide hcl, Mometasone furo-formoterol fum, Mometasone furoate, Morphine, Olmesartan medoxomil, and Breo ellipta [fluticasone furoate-vilanterol]    Review of Systems   Review of Systems  Respiratory:  Positive for shortness of breath.     Physical Exam Updated Vital Signs BP (!) 158/75   Pulse 67   Temp 98.7 F (37.1 C) (Oral)   Resp 13   SpO2 99%  Physical Exam Vitals and nursing note reviewed.  Constitutional:      General: She is not in acute distress.    Appearance: She is well-developed. She is not ill-appearing, toxic-appearing or diaphoretic.  HENT:     Head: Normocephalic and atraumatic.  Eyes:     Conjunctiva/sclera: Conjunctivae normal.  Neck:     Vascular: No JVD.  Cardiovascular:     Rate and Rhythm: Normal rate and regular rhythm.     Pulses: Normal pulses.     Heart sounds: No murmur heard.    Comments: Mild heave appreciated with auscultation. Pulmonary:     Effort: Pulmonary effort is normal. No accessory muscle usage or respiratory distress.     Breath sounds: No stridor. Wheezing present.  Chest:     Chest wall: Tenderness present. No mass or crepitus.       Comments: Mildly TTP as indicated above Abdominal:     Palpations: Abdomen is soft.     Tenderness: There is no abdominal tenderness.  Musculoskeletal:        General: No swelling.     Cervical back: Neck supple.     Right lower leg: No tenderness. No edema.     Left lower leg: No tenderness. No edema.  Skin:    General: Skin is warm and dry.     Capillary Refill: Capillary refill takes less than 2 seconds.  Neurological:     Mental Status: She is alert.  Psychiatric:        Mood and Affect: Mood normal.     ED Results / Procedures / Treatments   Labs (all labs ordered are listed, but only abnormal results are displayed) Labs Reviewed  BASIC METABOLIC PANEL - Abnormal; Notable for the following components:      Result Value   Glucose, Bld 103 (*)    All other  components within normal limits  CBC  TROPONIN I (HIGH SENSITIVITY)  TROPONIN I (HIGH SENSITIVITY)    EKG EKG Interpretation  Date/Time:  Saturday March 30 2022 11:48:55 EST Ventricular Rate:  74 PR Interval:  174 QRS Duration: 84 QT Interval:  356 QTC Calculation: 395 R Axis:   74 Text Interpretation: Normal sinus rhythm Normal ECG When compared with ECG of 28-Sep-2021 08:34, PREVIOUS ECG IS PRESENT Confirmed by Campbell Stall ([id number]) on 17/79/3903 1:51:32 PM  Radiology DG Chest 2 View  Result Date: 03/30/2022 CLINICAL DATA:  Shortness of breath EXAM: CHEST - 2 VIEW COMPARISON:  CXR 09/28/21 FINDINGS: No pleural effusion. No pneumothorax. No focal airspace opacity. Normal cardiac and mediastinal contours. There are aortic atherosclerotic calcifications. Visualized upper abdomen is unremarkable. Vertebral body heights are maintained. There are cholecystectomy clips in the  right upper quadrant. IMPRESSION: No radiographic finding to explain shortness of breath. Electronically Signed   By: Marin Roberts M.D.   On: 03/30/2022 12:48    Procedures Procedures    Medications Ordered in ED Medications  albuterol (PROVENTIL) (2.5 MG/3ML) 0.083% nebulizer solution 5 mg (has no administration in time range)  ipratropium-albuterol (DUONEB) 0.5-2.5 (3) MG/3ML nebulizer solution 3 mL (3 mLs Nebulization Given 03/30/22 1423)  albuterol (PROVENTIL) (2.5 MG/3ML) 0.083% nebulizer solution 5 mg (5 mg Nebulization Given 03/30/22 1423)    ED Course/ Medical Decision Making/ A&P                           Medical Decision Making Amount and/or Complexity of Data Reviewed Labs: ordered. Radiology: ordered.  Risk Prescription drug management.   81 y.o. female presents to the ED for concern of Shortness of Breath     This involves an extensive number of treatment options, and is a complaint that carries with it a high risk of complications and morbidity.  The emergent differential diagnosis prior  to evaluation includes, but is not limited to: COPD exacerbation, CHF, ACS, PE, pneumothorax  This is not an exhaustive differential.   Past Medical History / Co-morbidities / Social History: Hx of fibromyalgia, Dupuytren's, asthma, chronic bronchitis, essential HTN, GERD, seasonal allergic rhinitis, esophageal varices, paroxysmal A-fib, anxiety, and diverticulosis Social Determinants of Health include: Elderly  Additional History:  Pt's son at bedside  Lab Tests: I ordered, and personally interpreted labs.  The pertinent results include:   BMP without evidence of electrolyte derangement or AKI CBC without leukocytosis or evidence of anemia Troponin flat at 5  Imaging Studies: I ordered imaging studies including CXR.   I independently visualized and interpreted imaging which showed no evidence of pneumonia, pneumothorax, or other acute cardiopulmonary pathology I agree with the radiologist interpretation.  Cardiac Monitoring: The patient was maintained on a cardiac monitor.  I personally viewed and interpreted the cardiac monitored which showed an underlying rhythm of: NSR EKG without evidence of ST elevation or depression, or arrhythmia  ED Course / Critical Interventions: Pt overall well-appearing on exam.  Nontoxic, nonseptic appearing in NAD.  Afebrile.  Presenting with worsening shortness of breath and mild chest discomfort/pressure over the last week.  Shortness of breath has continued to worsen with exertion, though the chest symptoms seem to wax and wane with exertion.  States she usually feels this way with her COPD/asthma.  Has been taking her Advair for 10 to 15 years and felt overall well controlled, however within the last 2 to 3 months her insurance and stopped covering Advair.  Tried various of alternative agents without success.  Recently found out 2 days ago her Advair was being covered again, restarted it yesterday.  Had not been taking any long-term acting inhalers over  the last week prior to restarting the Advair.  No Hx of known MI, though with known Hx of COPD (chronic bronchitis when), asthma, and CHF.  Was evaluated by cardiology 2 to 3 months ago, with normal echo that just indicated some mild CHF, no other acute findings.  Had originally been sent to cardiology for an episode of A-fib, has not felt or been observed in A-fib since then. ABCs intact.  Able to swallow without difficulty.  No angioedema.  No hives or rash.  No known recent sick contacts.  EKG overall unremarkable without evidence of ST elevation or depression, does not appear to be  crossing over in the system.  Troponins flat at 5.  Chest x-ray unremarkable, without evidence of pneumonia, pneumothorax, or cardiomegaly.  Chest pain worsened with cough per pt.  Lung sounds equal bilaterally with equal chest rise and mild wheezing appreciated.  No rales or rhonchi on exam.  Without lower extremity edema or swelling.  Does not appear fluid overloaded or dehydrated.  Without significant recent weight gain per pt.  Low initial suspicion for CHF exacerbation at this time.  Not tachycardic, without clinical evidence of DVT.  Doubt PE at this time.  Mild hoarseness of voice, patient notes to be close to baseline though a little bit worse than normal.  Plan to proceed with DuoNeb and albuterol and reassess.  Patient with significant history of allergies that includes multiple steroids.  Plan to discuss with pharmacy to coordinate best approach for possible steroid administration to help open up her airways. Upon reevaluation, patient reports significant improvement in her shortness of breath and chest pressure.  Also patient's voice has improved though with still some mild lingering hoarseness and cough.  Patient's son at bedside, have received verbal permission from patient to discuss medical information with him.  Patient's son confirms patient appears much closer to baseline.  Recommend proceeding with burst of  steroids and 1 more round of DuoNeb/albuterol, with reassessment.  If without significant improvement and/or if with evidence of hypoxia, may consider admission and/or further work-up.  However if significant improvement and remains hemodynamically stable without evidence of hypoxia, believe patient would benefit from at home management and close follow-up with PCP within the next 2 to 3 days for reevaluation and continued medical management. Discussed with the patient the importance of following up with PCP discussed allergy/adverse reaction medication list.  Pt and her son in agreement.  Disposition: [id number] care of Makinzi F Kravitz transferred to PA Theodis Blaze at the end of my shift as the patient will require reassessment once labs/imaging have resulted.  Patient presentation, ED course, and plan of care discussed with review of all pertinent labs and imaging.  Please see his/her note for further details regarding further ED course and disposition.  Plan at time of handoff as seen above.  This may be altered or completely changed at the discretion of the oncoming team pending results of further workup.  I discussed this case with my attending, Dr. Pearline Cables, who agreed with the proposed treatment course and cosigned this note.  Attending physician stated agreement with plan or made changes to plan which were implemented.    This chart was dictated using voice recognition software.  Despite best efforts to proofread, errors can occur which can change the documentation meaning.         Final Clinical Impression(s) / ED Diagnoses Final diagnoses:  None    Rx / DC Orders ED Discharge Orders     None         Prince Rome, PA-C 63/14/97 [id number]    Lianne Cure, DO 78/58/85 [id number]

## 2022-04-02 ENCOUNTER — Telehealth: Payer: Self-pay

## 2022-04-02 ENCOUNTER — Encounter: Payer: Self-pay | Admitting: Family

## 2022-04-02 ENCOUNTER — Ambulatory Visit (INDEPENDENT_AMBULATORY_CARE_PROVIDER_SITE_OTHER): Payer: PPO | Admitting: Family

## 2022-04-02 VITALS — BP 134/70 | HR 64 | Temp 97.1°F | Ht 64.0 in | Wt 172.0 lb

## 2022-04-02 DIAGNOSIS — K219 Gastro-esophageal reflux disease without esophagitis: Secondary | ICD-10-CM | POA: Diagnosis not present

## 2022-04-02 DIAGNOSIS — I480 Paroxysmal atrial fibrillation: Secondary | ICD-10-CM

## 2022-04-02 DIAGNOSIS — J449 Chronic obstructive pulmonary disease, unspecified: Secondary | ICD-10-CM

## 2022-04-02 DIAGNOSIS — I10 Essential (primary) hypertension: Secondary | ICD-10-CM

## 2022-04-02 MED ORDER — ESOMEPRAZOLE MAGNESIUM 20 MG PO CPDR: 20.0000 mg | DELAYED_RELEASE_CAPSULE | Freq: Every day | ORAL | 1 refills | Status: DC

## 2022-04-02 NOTE — Progress Notes (Signed)
Provider: Ossie Yebra FNP-C  Lauree Chandler, NP  Patient Care Team: Lauree Chandler, NP as PCP - General (Nurse Practitioner) Burnell Blanks, MD as PCP - Cardiology (Cardiology)  Extended Emergency Contact Information Primary Emergency Contact: Jerald Kief Address: [place] [address] Johnnette Litter of [place] Phone: [phone] Mobile Phone: [phone] Relation: Son  Code Status:  DNR Goals of care: Advanced Directive information    01/22/2022    9:51 AM  Advanced Directives  Does Patient Have a Medical Advance Directive? Yes  Type of Advance Directive Out of facility DNR (pink MOST or yellow form)  Does patient want to make changes to medical advance directive? No - Patient declined  Pre-existing out of facility DNR order (yellow form or pink MOST form) Pink MOST form placed in chart (order not valid for inpatient use);Yellow form placed in chart (order not valid for inpatient use)     Chief Complaint  Patient presents with   Hospitalization [facility] follow up 03/30/2022 for COPD.    HPI:  Pt is a 81 y.o. female seen today for an acute visit for Hospital follow up post hospitalization from 03/30/2022 for COPD exacerbation x 1 week and chest pressure. Advair no longer covered by insurance for the past 1-2 months.recently restarted  Troponin was negative.EKG showed no evidence of ACS.CXR was negative.she was treated with albuterol nebulizer and Duonebs and steroids.BNP was negative.COVID-19 was also negative.CBC showed no acute findings.she was discharge on oral prednisone.Advised to f/u with cardiology and PCP. States feeling much better.Has appointment with cardiology tomorrow 04/03/2022.    Past Medical History:  Diagnosis Date   (HFpEF) heart failure with preserved ejection fraction ([place]) 12/26/2021   Echocardiogram 12/2021: EF 55-60, no RWMA, Gr 2 DD, low normal RVSF, mildly elevated  PASP (RVSP 38.5), mild LAE, trivial MR   ALLERGIC RHINITIS    Anxiety    Asthma    Blood type O+    Cervical strain, acute    Cirrhosis ([place])    Colon polyp 2010   adenoma   Diverticulosis of colon    Dizziness    Esophageal varices (HCC)    Fibromyalgia    GERD (gastroesophageal reflux disease)    History of nephrolithiasis    Hypercholesterolemia    borderline   Hypertension    IBS (irritable bowel syndrome)    Osteoarthritis    Personal history of allergy to unspecified medicinal agent    Postconcussion syndrome    Vitamin D deficiency    Past Surgical History:  Procedure Laterality Date   CHOLECYSTECTOMY, LAPAROSCOPIC  2004   for gallstone pancreatitis   KNEE SURGERY Right    KNEE SURGERY Left    THYROID SURGERY     cyst removed    Allergies  Allergen Reactions   Wixela Inhub [Fluticasone-Salmeterol] Shortness Of Breath   Ace Inhibitors     cough   Amoxicillin Itching    REACTION: unknown? pain in right kidney   Benicar Hct [Olmesartan Medoxomil-Hctz]     Extreme weakness   Budesonide-Formoterol Fumarate     Causes tremors and numbness   Chlorzoxazone Hives   Chlorzoxazone [Chlorzoxazone]    Citalopram Hydrobromide     REACTION: hives   Citalopram Hydrobromide    Clonidine Derivatives    Cymbalta [Duloxetine Hcl]    Droperidol     REACTION: hives   Flexeril [Cyclobenzaprine Hcl]  Fluconazole Nausea And Vomiting and Other (See Comments)    fatigue   Formoterol Itching   Gabapentin Itching   Ketorolac Tromethamine     REACTION: hives   Ketorolac Tromethamine    Metoclopramide Hcl    Mometasone Furo-Formoterol Fum     Causes sore throat, blurred vision and unable to sleep--started on med 09-17-10   Mometasone Furoate Itching   Morphine     REACTION: hives and itching   Olmesartan Medoxomil     REACTION: fatique   Breo Ellipta [Fluticasone Furoate-Vilanterol] Itching    Pt reports itching with Memory [employer] is related to being lactose intolerant.      Outpatient Encounter Medications as of 04/02/2022  Medication Sig   acetaminophen (TYLENOL) 500 MG tablet Take 1,000 mg by mouth 2 (two) times a day.   albuterol (PROAIR HFA) 108 (90 Base) MCG/ACT inhaler INHALE 2 PUFFS EVERY 4 HOURS AS NEEDED INTO THE LUNGS FOR WHEEZING OR SHORTNESS OF BREATH J45.20   amLODipine (NORVASC) 5 MG tablet TAKE 1 TABLET (5 MG TOTAL) BY MOUTH DAILY   cetirizine (ZYRTEC) 10 MG tablet Take 10 mg by mouth daily.   Cholecalciferol 50 MCG (2000 UT) CAPS Take 2,000 Units by mouth in the morning and at bedtime.   ELIQUIS 2.5 MG TABS tablet TAKE 1 TABLET BY MOUTH TWICE A DAY   esomeprazole (NEXIUM) 20 MG capsule Take 1 capsule (20 mg total) by mouth daily. Please schedule a follow up appointment for further refills. Thank you   ezetimibe (ZETIA) 10 MG tablet Take 1 tablet (10 mg total) by mouth daily.   fluticasone-salmeterol (ADVAIR HFA) 115-21 MCG/ACT inhaler Inhale 2 puffs into the lungs 2 (two) times daily.   furosemide (LASIX) 20 MG tablet Take 1 tablet (20 mg total) by mouth daily.   losartan (COZAAR) 25 MG tablet TAKE 1 TABLET BY MOUTH EVERY DAY   Omega-3 Fatty Acids (FISH OIL MAXIMUM STRENGTH) 1200 MG CAPS Take 1,200 mg by mouth 2 (two) times daily.    predniSONE (DELTASONE) 20 MG tablet Take 2 tablets (40 mg total) by mouth daily for 4 days.   propranolol (INDERAL) 10 MG tablet Take 1 tablet (10 mg total) by mouth 2 (two) times daily.   Propylene Glycol (SYSTANE BALANCE) 0.6 % SOLN Place 2 drops into both eyes 2 (two) times daily.   sodium chloride (OCEAN) 0.65 % nasal spray Place 1 spray into the nose daily.   [DISCONTINUED] fluticasone (FLOVENT HFA) 110 MCG/ACT inhaler Inhale 1-2 puffs into the lungs every 12 (twelve) hours.   [DISCONTINUED] gabapentin (NEURONTIN) 100 MG capsule Take 3 mg by mouth at bedtime.   No facility-administered encounter medications on file as of 04/02/2022.    Review of Systems  Constitutional:  Negative for appetite change,  chills, fatigue, fever and unexpected weight change.  HENT:  Negative for congestion, dental problem, ear discharge, ear pain, facial swelling, hearing loss, nosebleeds, postnasal drip, rhinorrhea, sinus pressure, sinus pain, sneezing, sore throat, tinnitus and trouble swallowing.   Eyes:  Negative for pain, discharge, redness, itching and visual disturbance.  Respiratory:  Negative for cough, chest tightness, shortness of breath and wheezing.        Shortness of breath has improved   Cardiovascular:  Negative for chest pain, palpitations and leg swelling.  Gastrointestinal:  Negative for abdominal distention, abdominal pain, constipation, diarrhea, nausea and vomiting.  Endocrine: Negative for cold intolerance, heat intolerance, polydipsia, polyphagia and polyuria.  Genitourinary:  Negative for difficulty urinating, dysuria, flank pain, frequency  and urgency.  Musculoskeletal:  Positive for gait problem. Negative for arthralgias, back pain, joint swelling, myalgias, neck pain and neck stiffness.  Skin:  Negative for color change, pallor and rash.  Neurological:  Negative for dizziness, syncope, speech difficulty, weakness, light-headedness, numbness and headaches.  Hematological:  Does not bruise/bleed easily.  Psychiatric/Behavioral:  Negative for agitation, behavioral problems, confusion, hallucinations, self-injury, sleep disturbance and suicidal ideas. The patient is not nervous/anxious.     Immunization History  Administered Date(s) Administered   Fluad Quad(high Dose 65+) 02/27/2021, 02/04/2022   Hepatitis A, Adult 10/15/2017, 04/17/2018   Influenza Split 02/11/2011, 02/25/2012   Influenza Whole 02/10/2009, 02/26/2010   Influenza, High Dose Seasonal PF 04/24/2017, 01/26/2018, 01/26/2018, 01/03/2019, 02/11/2020   Influenza,inj,Quad PF,6+ Mos 02/09/2014, 03/20/2015, 02/27/2016   Influenza-Unspecified 03/13/2013, 03/13/2017   PFIZER Comirnaty(Gray Top)Covid-19 Tri-Sucrose Vaccine  10/17/2020, 02/08/2022   PFIZER(Purple Top)SARS-COV-2 Vaccination 06/03/2019, 06/22/2019, 02/09/2020   Pneumococcal Conjugate-13 02/27/2016   Pneumococcal Polysaccharide-23 08/25/2014   Tdap 12/06/2013   Zoster Recombinat (Shingrix) 01/13/2019, 08/05/2019   Pertinent  Health Maintenance Due  Topic Date Due   INFLUENZA VACCINE  Completed   DEXA SCAN  Completed   COLONOSCOPY (Pts 45-17yr Insurance coverage will need to be confirmed)  Discontinued      11/16/2021    8:21 AM 12/21/2021   10:53 AM 01/22/2022    9:50 AM 03/30/2022   12:07 PM 04/02/2022    8:39 AM  Fall Risk  Falls in the past year? 0 0 0  0  Was there an injury with Fall? 0 0 0    Fall Risk Category Calculator 0 0 0    Fall Risk Category Low Low Low    Patient Fall Risk Level Low fall risk Low fall risk Low fall risk Low fall risk   Patient at Risk for Falls Due to History of fall(s) No Fall Risks No Fall Risks    Fall risk Follow up Falls evaluation completed Falls evaluation completed Falls evaluation completed     Functional Status Survey:    Vitals:   04/02/22 0825  BP: 134/70  Pulse: 64  Temp: (!) 97.1 F (36.2 C)  TempSrc: Temporal  SpO2: 97%  Weight: 172 lb (78 kg)  Height: _0  (1.626 m)   Body mass index is 29.52 kg/m. Physical Exam Vitals reviewed.  Constitutional:      General: She is not in acute distress.    Appearance: Normal appearance. She is normal weight. She is not ill-appearing or diaphoretic.  HENT:     Head: Normocephalic.     Right Ear: Tympanic membrane, ear canal and external ear normal. There is no impacted cerumen.     Left Ear: Tympanic membrane, ear canal and external ear normal. There is no impacted cerumen.     Nose: Nose normal. No congestion or rhinorrhea.     Mouth/Throat:     Mouth: Mucous membranes are moist.     Pharynx: Oropharynx is clear. No oropharyngeal exudate or posterior oropharyngeal erythema.  Eyes:     General: No scleral icterus.       Right eye: No  discharge.        Left eye: No discharge.     Extraocular Movements: Extraocular movements intact.     Conjunctiva/sclera: Conjunctivae normal.     Pupils: Pupils are equal, round, and reactive to light.  Neck:     Vascular: No carotid bruit.  Cardiovascular:     Rate and Rhythm: Normal rate and regular rhythm.  Pulses: Normal pulses.     Heart sounds: Normal heart sounds. No murmur heard.    No friction rub. No gallop.  Pulmonary:     Effort: Pulmonary effort is normal. No respiratory distress.     Breath sounds: Decreased air movement present. No wheezing, rhonchi or rales.  Chest:     Chest wall: No tenderness.  Abdominal:     General: Bowel sounds are normal. There is no distension.     Palpations: Abdomen is soft. There is no mass.     Tenderness: There is no abdominal tenderness. There is no right CVA tenderness, left CVA tenderness, guarding or rebound.  Musculoskeletal:        General: No swelling or tenderness. Normal range of motion.     Cervical back: Normal range of motion. No rigidity or tenderness.     Right lower leg: No edema.     Left lower leg: No edema.  Lymphadenopathy:     Cervical: No cervical adenopathy.  Skin:    General: Skin is warm and dry.     Coloration: Skin is not pale.     Findings: No bruising, erythema, lesion or rash.  Neurological:     Mental Status: She is alert and oriented to person, place, and time.     Cranial Nerves: No cranial nerve deficit.     Sensory: No sensory deficit.     Motor: No weakness.     Coordination: Coordination normal.     Gait: Gait normal.  Psychiatric:        Mood and Affect: Mood normal.        Speech: Speech normal.        Behavior: Behavior normal.        Thought Content: Thought content normal.        Cognition and Memory: She exhibits impaired recent memory.        Judgment: Judgment normal.     Labs reviewed: Recent Labs    12/21/21 1149 01/02/22 1104 03/30/22 1211  NA 142 140 137  K 4.3  4.2 4.2  CL 106 102 101  CO2 _0 GLUCOSE 98 99 103*  BUN _1 CREATININE 0.64 0.75 0.70  CALCIUM 9.5 9.8 9.5   Recent Labs    05/08/21 0921 08/13/21 0942 12/21/21 1149  AST _2 ALT _3 BILITOT 1.0 0.8 0.6  PROT 7.0 6.7 6.6   Recent Labs    08/13/21 0942 09/28/21 0845 12/21/21 1149 03/30/22 1211  WBC 6.8 7.1 8.1 8.6  NEUTROABS 2,271 3.2 3,588  --   HGB 14.1 14.2 14.6 13.9  HCT 41.9 42.5 42.5 41.6  MCV 88.6 90.4 89.1 89.3  PLT 282 248 301 327   Lab Results  Component Value Date   TSH 1.666 11/22/2019   Lab Results  Component Value Date   HGBA1C 5.5 11/06/2020   Lab Results  Component Value Date   CHOL 212 (H) 11/16/2021   HDL 51 11/16/2021   LDLCALC 134 (H) 11/16/2021   LDLDIRECT 102.5 07/02/2012   TRIG 143 11/16/2021   CHOLHDL 4.2 11/16/2021    Significant Diagnostic Results in last 30 days:  DG Chest 2 View  Result Date: 03/30/2022 CLINICAL DATA:  Shortness of breath EXAM: CHEST - 2 VIEW COMPARISON:  CXR 09/28/21 FINDINGS: No pleural effusion. No pneumothorax. No focal airspace opacity. Normal cardiac and mediastinal contours. There are aortic atherosclerotic calcifications. Visualized upper abdomen is unremarkable. Vertebral  body heights are maintained. There are cholecystectomy clips in the right upper quadrant. IMPRESSION: No radiographic finding to explain shortness of breath. Electronically Signed   By: Marin Roberts M.D.   On: 03/30/2022 12:48    Assessment/Plan 1. Gastroesophageal reflux disease without esophagitis - Symptoms controlled. H/H stable.No tarry or black stool  - advised to avoid eating meals late in the evening and to avoid aggravating foods and spices. - continue on Nexium  - esomeprazole (NEXIUM) 20 MG capsule; Take 1 capsule (20 mg total) by mouth daily. Please schedule a follow up appointment for further refills. Thank you  Dispense: 30 capsule; Refill: 1  2. Chronic obstructive pulmonary disease, unspecified  COPD type ([place]) Status post ED visit with exacerbation. - symptoms have improved  - complete Prednisone as prescribed in ED - continue on Albuterol and Advair - bilateral air decreased without any wheezes,rales or rhonchi or SOB noted. No signs of fluid overload  -  discussed refer to pulmonology for PFT - Ambulatory referral to Pulmonology - CBC with Differential/Platelet - CMP with eGFR([facility])  3. Paroxysmal atrial fibrillation (HCC) HR controlled  - continue on Eliquis  - follow up with cardiologist as scheduled tomorrow   4. Essential hypertension B/p well controlled  - continue on amlodipine,losartan,propranolol and furosemide  Family/ staff Communication: Reviewed plan of care with patient verbalized understanding   Labs/tests ordered:  - CBC with Differential/Platelet - CMP with eGFR([facility])  Next Appointment: Return if symptoms worsen or fail to improve.   Sandrea Hughs, NP

## 2022-04-02 NOTE — Telephone Encounter (Signed)
        Patient  visited [place] on 11/18    Telephone encounter attempt :  1st  A HIPAA compliant voice message was left requesting a return call.  Instructed patient to call back   [place], [place] [facility] [employer]  [phone] [address], [place], [place] [address] Phone: [phone] Email: [email or web address]

## 2022-04-02 NOTE — Patient Instructions (Signed)
Follow up with Cardiologist tomorrow as scheduled

## 2022-04-02 NOTE — Progress Notes (Unsigned)
Office Visit    Patient Name: [name] Date of Encounter: 04/02/2022  Primary Care Provider:  Lauree Chandler, NP Primary Cardiologist:  Lauree Chandler, MD Primary Electrophysiologist: None  Chief Complaint    [name] is a 81 y.o. female with PMH of PAF, HTN, HLD, fibromyalgia, GERD, NASH cirrhosis with esophageal varices who presents today for post ED follow-up visit.  Past Medical History    Past Medical History:  Diagnosis Date   (HFpEF) heart failure with preserved ejection fraction ([place]) 12/26/2021   Echocardiogram 12/2021: EF 55-60, no RWMA, Gr 2 DD, low normal RVSF, mildly elevated PASP (RVSP 38.5), mild LAE, trivial MR   ALLERGIC RHINITIS    Anxiety    Asthma    Blood type O+    Cervical strain, acute    Cirrhosis (HCC)    Colon polyp 2010   adenoma   Diverticulosis of colon    Dizziness    Esophageal varices (HCC)    Fibromyalgia    GERD (gastroesophageal reflux disease)    History of nephrolithiasis    Hypercholesterolemia    borderline   Hypertension    IBS (irritable bowel syndrome)    Osteoarthritis    Personal history of allergy to unspecified medicinal agent    Postconcussion syndrome    Vitamin D deficiency    Past Surgical History:  Procedure Laterality Date   CHOLECYSTECTOMY, LAPAROSCOPIC  2004   for gallstone pancreatitis   KNEE SURGERY Right    KNEE SURGERY Left    THYROID SURGERY     cyst removed    Allergies  Allergies  Allergen Reactions   Wixela Inhub [Fluticasone-Salmeterol] Shortness Of Breath   Ace Inhibitors     cough   Amoxicillin Itching    REACTION: unknown? pain in right kidney   Benicar Hct [Olmesartan Medoxomil-Hctz]     Extreme weakness   Budesonide-Formoterol Fumarate     Causes tremors and numbness   Chlorzoxazone Hives   Chlorzoxazone [Chlorzoxazone]    Citalopram Hydrobromide     REACTION: hives   Citalopram Hydrobromide    Clonidine Derivatives    Cymbalta [Duloxetine Hcl]     Droperidol     REACTION: hives   Flexeril [Cyclobenzaprine Hcl]    Fluconazole Nausea And Vomiting and Other (See Comments)    fatigue   Formoterol Itching   Gabapentin Itching   Ketorolac Tromethamine     REACTION: hives   Ketorolac Tromethamine    Metoclopramide Hcl    Mometasone Furo-Formoterol Fum     Causes sore throat, blurred vision and unable to sleep--started on med 09-17-10   Mometasone Furoate Itching   Morphine     REACTION: hives and itching   Olmesartan Medoxomil     REACTION: fatique   Breo Ellipta [Fluticasone Furoate-Vilanterol] Itching    Pt reports itching with [place] is related to being lactose intolerant.     History of Present Illness    [name]  is a 81 year old female with the above mention past medical history who presents today for post ED follow-up of shortness of breath.  [name] seen initially by Dr. Angelena Form in 2022 for management of atrial fibrillation.  She was found to have AF following admission for UTI with fever.  She was started on Eliquis and beta-blocker was titrated and continued.  2D echo was completed showing EF of 60-65%, and no valve disease.  She was last seen by Richardson Dopp, PA on 12/2021  for complaint of chest pain and shortness of breath.  She had recently noticed chest discomfort at her PCP office.  She described the pain as a vibratory sensation.  She is currently living at [facility] independent living.  During her visit she denies chest pain or pressure but did endorse sleeping on an incline due to mild nonproductive cough.  She also endorsed sodium indiscretions in her diet.  She was treated with Lasix 20 mg daily and 2D echo was ordered and revealed EF of 55-60% with grade 2 DD and no RWMA with mildly dilated LA no valvular abnormalities.  She was seen in the ED on 03/30/2022 with complaint of shortness of breath with exertion.  She endorses occasional chest pain that occurred with worsening cough.  Troponins were flat and chest  x-ray was unremarkable without evidence of new or cardiomegaly.  She was treated with DuoNeb and steroid burst.  She experienced improved symptoms and was discharged with instructions to follow-up with PCP and cardiology.   Since last being seen in the office patient reports***.  Patient denies chest pain, palpitations, dyspnea, PND, orthopnea, nausea, vomiting, dizziness, syncope, edema, weight gain, or early satiety.  ***Notes: Can consider event monitor if chest pain reoccurs and is more frequent Home Medications    Current Outpatient Medications  Medication Sig Dispense Refill   acetaminophen (TYLENOL) 500 MG tablet Take 1,000 mg by mouth 2 (two) times a day.     albuterol (PROAIR HFA) 108 (90 Base) MCG/ACT inhaler INHALE 2 PUFFS EVERY 4 HOURS AS NEEDED INTO THE LUNGS FOR WHEEZING OR SHORTNESS OF BREATH J45.20 25.5 g 5   amLODipine (NORVASC) 5 MG tablet TAKE 1 TABLET (5 MG TOTAL) BY MOUTH DAILY 90 tablet 1   cetirizine (ZYRTEC) 10 MG tablet Take 10 mg by mouth daily.     Cholecalciferol 50 MCG (2000 UT) CAPS Take 2,000 Units by mouth in the morning and at bedtime.     ELIQUIS 2.5 MG TABS tablet TAKE 1 TABLET BY MOUTH TWICE A DAY 60 tablet 5   esomeprazole (NEXIUM) 20 MG capsule Take 1 capsule (20 mg total) by mouth daily. Please schedule a follow up appointment for further refills. Thank you 30 capsule 1   ezetimibe (ZETIA) 10 MG tablet Take 1 tablet (10 mg total) by mouth daily. 90 tablet 3   fluticasone-salmeterol (ADVAIR HFA) 115-21 MCG/ACT inhaler Inhale 2 puffs into the lungs 2 (two) times daily. 1 each 12   furosemide (LASIX) 20 MG tablet Take 1 tablet (20 mg total) by mouth daily. 90 tablet 3   losartan (COZAAR) 25 MG tablet TAKE 1 TABLET BY MOUTH EVERY DAY 90 tablet 3   Omega-3 Fatty Acids (FISH OIL MAXIMUM STRENGTH) 1200 MG CAPS Take 1,200 mg by mouth 2 (two) times daily.      predniSONE (DELTASONE) 20 MG tablet Take 2 tablets (40 mg total) by mouth daily for 4 days. 8 tablet 0    propranolol (INDERAL) 10 MG tablet Take 1 tablet (10 mg total) by mouth 2 (two) times daily. 60 tablet 11   Propylene Glycol (SYSTANE BALANCE) 0.6 % SOLN Place 2 drops into both eyes 2 (two) times daily. 15 mL 0   sodium chloride (OCEAN) 0.65 % nasal spray Place 1 spray into the nose daily.     No current facility-administered medications for this visit.     Review of Systems  Please see the history of present illness.    (+)*** (+)***  All other systems  reviewed and are otherwise negative except as noted above.  Physical Exam    Wt Readings from Last 3 Encounters:  04/02/22 172 lb (78 kg)  12/26/21 178 lb 3.2 oz (80.8 kg)  12/21/21 180 lb 6.4 oz (81.8 kg)   XN:ATFTD were no vitals filed for this visit.,There is no height or weight on file to calculate BMI.  Constitutional:      Appearance: Healthy appearance. Not in distress.  Neck:     Vascular: JVD normal.  Pulmonary:     Effort: Pulmonary effort is normal.     Breath sounds: No wheezing. No rales. Diminished in the bases Cardiovascular:     Normal rate. Regular rhythm. Normal S1. Normal S2.      Murmurs: There is no murmur.  Edema:    Peripheral edema absent.  Abdominal:     Palpations: Abdomen is soft non tender. There is no hepatomegaly.  Skin:    General: Skin is warm and dry.  Neurological:     General: No focal deficit present.     Mental Status: Alert and oriented to person, place and time.     Cranial Nerves: Cranial nerves are intact.  EKG/LABS/Other Studies Reviewed    ECG personally reviewed by me today - ***  Risk Assessment/Calculations:   {Does this patient have ATRIAL FIBRILLATION?:[id number]}        Lab Results  Component Value Date   WBC 8.6 03/30/2022   HGB 13.9 03/30/2022   HCT 41.6 03/30/2022   MCV 89.3 03/30/2022   PLT 327 03/30/2022   Lab Results  Component Value Date   CREATININE 0.70 03/30/2022   BUN 18 03/30/2022   NA 137 03/30/2022   K 4.2 03/30/2022   CL 101 03/30/2022    CO2 26 03/30/2022   Lab Results  Component Value Date   ALT 17 12/21/2021   AST 24 12/21/2021   ALKPHOS 52 11/25/2019   BILITOT 0.6 12/21/2021   Lab Results  Component Value Date   CHOL 212 (H) 11/16/2021   HDL 51 11/16/2021   LDLCALC 134 (H) 11/16/2021   LDLDIRECT 102.5 07/02/2012   TRIG 143 11/16/2021   CHOLHDL 4.2 11/16/2021    Lab Results  Component Value Date   HGBA1C 5.5 11/06/2020    Assessment & Plan    1.  HFpEF: -Patient had 2D echo completed 12/2021 with EF of 55-60% with grade 2 DD and no RWMA with mildly dilated LA no valvular abnormalities. -She was recently admitted with complaint of shortness of breath to the ED -Today  patient reports***  2.  Paroxysmal atrial fibrillation: -Patient is rate controlled with propranolol 10 mg daily -Continue Eliquis 2.5 twice daily appropriate   3.  Essential hypertension: -Patient's blood pressure today was -Continue current antihypertensive regimen with amlodipine 5 mg daily, losartan 25 mg daily Inderal as noted above.  4.  Shortness of breath: -Seen recently 11/18 in the ED with shortness of breath that was worsened with exertion. -Today patient is***      Disposition: Follow-up with Lauree Chandler, MD or APP in *** months {Are you ordering a CV Procedure (e.g. stress test, cath, DCCV, TEE, etc)?   Press F2        :[id number]}   Medication Adjustments/Labs and Tests Ordered: Current medicines are reviewed at length with the patient today.  Concerns regarding medicines are outlined above.   Signed, Mable Fill, Marissa Nestle, NP 04/02/2022, 12:43 PM [facility]  Note:  This  document was prepared using Systems analyst and may include unintentional dictation errors.

## 2022-04-03 ENCOUNTER — Telehealth: Payer: Self-pay

## 2022-04-03 ENCOUNTER — Ambulatory Visit: Payer: PPO | Attending: Nurse Practitioner | Admitting: Nurse Practitioner

## 2022-04-03 ENCOUNTER — Encounter: Payer: Self-pay | Admitting: Nurse Practitioner

## 2022-04-03 VITALS — BP 130/70 | HR 70 | Ht 64.0 in | Wt 172.0 lb

## 2022-04-03 DIAGNOSIS — I10 Essential (primary) hypertension: Secondary | ICD-10-CM

## 2022-04-03 DIAGNOSIS — I480 Paroxysmal atrial fibrillation: Secondary | ICD-10-CM | POA: Diagnosis not present

## 2022-04-03 DIAGNOSIS — R0602 Shortness of breath: Secondary | ICD-10-CM

## 2022-04-03 DIAGNOSIS — I5032 Chronic diastolic (congestive) heart failure: Secondary | ICD-10-CM | POA: Diagnosis not present

## 2022-04-03 LAB — CBC WITH DIFFERENTIAL/PLATELET
Absolute Monocytes: 772 cells/uL (ref 200–950)
Basophils Absolute: 10 cells/uL (ref 0–200)
Basophils Relative: 0.1 %
Eosinophils Absolute: 0 cells/uL — ABNORMAL LOW (ref 15–500)
Eosinophils Relative: 0 %
HCT: 41.6 % (ref 35.0–45.0)
Hemoglobin: 14.1 g/dL (ref 11.7–15.5)
Lymphs Abs: 3029 cells/uL (ref 850–3900)
MCH: 29.9 pg (ref 27.0–33.0)
MCHC: 33.9 g/dL (ref 32.0–36.0)
MCV: 88.1 fL (ref 80.0–100.0)
MPV: 10.7 fL (ref 7.5–12.5)
Monocytes Relative: 7.8 %
Neutro Abs: 6089 cells/uL (ref 1500–7800)
Neutrophils Relative %: 61.5 %
Platelets: 386 10*3/uL (ref 140–400)
RBC: 4.72 10*6/uL (ref 3.80–5.10)
RDW: 13.1 % (ref 11.0–15.0)
Total Lymphocyte: 30.6 %
WBC: 9.9 10*3/uL (ref 3.8–10.8)

## 2022-04-03 LAB — COMPLETE METABOLIC PANEL WITH GFR
AG Ratio: 1.7 (calc) (ref 1.0–2.5)
ALT: 17 U/L (ref 6–29)
AST: 22 U/L (ref 10–35)
Albumin: 4.5 g/dL (ref 3.6–5.1)
Alkaline phosphatase (APISO): 68 U/L (ref 37–153)
BUN: 19 mg/dL (ref 7–25)
CO2: 28 mmol/L (ref 20–32)
Calcium: 9.9 mg/dL (ref 8.6–10.4)
Chloride: 100 mmol/L (ref 98–110)
Creat: 0.64 mg/dL (ref 0.60–0.95)
Globulin: 2.6 g/dL (calc) (ref 1.9–3.7)
Glucose, Bld: 100 mg/dL — ABNORMAL HIGH (ref 65–99)
Potassium: 4 mmol/L (ref 3.5–5.3)
Sodium: 139 mmol/L (ref 135–146)
Total Bilirubin: 0.8 mg/dL (ref 0.2–1.2)
Total Protein: 7.1 g/dL (ref 6.1–8.1)
eGFR: 88 mL/min/{1.73_m2} (ref >=60)

## 2022-04-03 NOTE — Telephone Encounter (Signed)
     Patient  visit on 11/18  at [facility]   Have you been able to follow up with your primary care physician? Yes   The patient was or was not able to obtain any needed medicine or equipment. Yes   Are there diet recommendations that you are having difficulty following? Yes   Patient expresses understanding of discharge instructions and education provided has no other needs at this time. Yes     [place], [facility], Care Management  [phone], [place], [place] [address] Phone: [phone] Email: [email or web address]

## 2022-04-03 NOTE — Patient Instructions (Signed)
Medication Instructions:  Your physician recommends that you continue on your current medications as directed. Please refer to the Current Medication list given to you today. *If you need a refill on your cardiac medications before your next appointment, please call your pharmacy*   Lab Work: None Ordered   Testing/Procedures: None Ordered   Follow-Up: At [facility], you and your health needs are our priority.  As part of our continuing mission to provide you with exceptional heart care, we have created designated Provider Care Teams.  These Care Teams include your primary Cardiologist (physician) and Advanced Practice Providers (APPs -  Physician Assistants and Nurse Practitioners) who all work together to provide you with the care you need, when you need it.  We recommend signing up for the patient portal called "MyChart".  Sign up information is provided on this After Visit Summary.  MyChart is used to connect with patients for Virtual Visits (Telemedicine).  Patients are able to view lab/test results, encounter notes, upcoming appointments, etc.  Non-urgent messages can be sent to your provider as well.   To learn more about what you can do with MyChart, go to [email or web address].    Your next appointment:   12 month(s)  The format for your next appointment:   In Person  Provider:   Lauree Chandler, MD     Other Instructions   Important Information About Sugar

## 2022-04-05 ENCOUNTER — Other Ambulatory Visit: Payer: Self-pay | Admitting: Nurse Practitioner

## 2022-04-05 DIAGNOSIS — R809 Proteinuria, unspecified: Secondary | ICD-10-CM

## 2022-04-05 DIAGNOSIS — I10 Essential (primary) hypertension: Secondary | ICD-10-CM

## 2022-04-05 DIAGNOSIS — R7303 Prediabetes: Secondary | ICD-10-CM

## 2022-04-08 ENCOUNTER — Encounter: Payer: Self-pay | Admitting: Family

## 2022-04-08 ENCOUNTER — Telehealth: Payer: Self-pay

## 2022-04-08 ENCOUNTER — Ambulatory Visit (INDEPENDENT_AMBULATORY_CARE_PROVIDER_SITE_OTHER): Payer: PPO | Admitting: Family

## 2022-04-08 VITALS — BP 110/60 | HR 81 | Temp 98.1°F | Resp 16 | Ht 64.0 in | Wt 170.8 lb

## 2022-04-08 DIAGNOSIS — J449 Chronic obstructive pulmonary disease, unspecified: Secondary | ICD-10-CM

## 2022-04-08 DIAGNOSIS — I480 Paroxysmal atrial fibrillation: Secondary | ICD-10-CM

## 2022-04-08 DIAGNOSIS — E782 Mixed hyperlipidemia: Secondary | ICD-10-CM

## 2022-04-08 DIAGNOSIS — K219 Gastro-esophageal reflux disease without esophagitis: Secondary | ICD-10-CM

## 2022-04-08 MED ORDER — IPRATROPIUM-ALBUTEROL 0.5-2.5 (3) MG/3ML IN SOLN: 3.0000 mL | Freq: Four times a day (QID) | RESPIRATORY_TRACT | 3 refills | Status: DC | PRN

## 2022-04-08 NOTE — Telephone Encounter (Signed)
Left message for patient to call back to discuss lab results.

## 2022-04-08 NOTE — Telephone Encounter (Signed)
-----   Message from Sandrea Hughs, NP sent at 04/08/2022  1:13 PM EST ----- All labs are within normal range except glucose slightly high though improved compared to previous labs.

## 2022-04-08 NOTE — Progress Notes (Signed)
Provider: Parisa Pinela FNP-C  Lauree Chandler, NP  Patient Care Team: Lauree Chandler, NP as PCP - General (Nurse Practitioner) Burnell Blanks, MD as PCP - Cardiology (Cardiology)  Extended Emergency Contact Information Primary Emergency Contact: Jerald Kief Address: [place] [address] Johnnette Litter of [place] Phone: [phone] Mobile Phone: [phone] Relation: Son  Code Status:  DNR Goals of care: Advanced Directive information    04/08/2022    9:31 AM  Advanced Directives  Does Patient Have a Medical Advance Directive? Yes  Type of Advance Directive Out of facility DNR (pink MOST or yellow form)  Does patient want to make changes to medical advance directive? No - Patient declined     Chief Complaint  Patient presents with   Acute Visit    Patient complains of COPD flare up.    HPI:  Pt is a 81 y.o. female seen today for an acute visit for evaluation of COPD flare up.States did not use her Advair and Albuterol this morning thought we could give her breathing treatment here at the office.she did not carry her rescue inhaler.Denies any worsening cough,fever,chills,cough,fatigue,body aches,runny nose,chest tightness,chest pain,palpitation or shortness of breath. She is status post ED visit 03/30/2022 for COPD exacerbation.Her lab work done was unremarkable. EKG showed no evidence of ACS.Checks x-ray also showed no acute abnormalities.No signs of fluid overload was noted.She was treated with albuterol nebulizer as well as DuoNeb with improvement in her symptoms.She was also treated with prednisone and discharged home with 4 more days of prednisone and advised to follow-up with PCP cardiologist.   Past Medical History:  Diagnosis Date   (HFpEF) heart failure with preserved ejection fraction ([place]) 12/26/2021   Echocardiogram 12/2021: EF 55-60, no RWMA, Gr 2 DD, low normal RVSF, mildly elevated PASP (RVSP  38.5), mild LAE, trivial MR   ALLERGIC RHINITIS    Anxiety    Asthma    Blood type O+    Cervical strain, acute    Cirrhosis ([place])    Colon polyp 2010   adenoma   Diverticulosis of colon    Dizziness    Esophageal varices (HCC)    Fibromyalgia    GERD (gastroesophageal reflux disease)    History of nephrolithiasis    Hypercholesterolemia    borderline   Hypertension    IBS (irritable bowel syndrome)    Osteoarthritis    Personal history of allergy to unspecified medicinal agent    Postconcussion syndrome    Vitamin D deficiency    Past Surgical History:  Procedure Laterality Date   CHOLECYSTECTOMY, LAPAROSCOPIC  2004   for gallstone pancreatitis   KNEE SURGERY Right    KNEE SURGERY Left    THYROID SURGERY     cyst removed    Allergies  Allergen Reactions   Wixela Inhub [Fluticasone-Salmeterol] Shortness Of Breath   Ace Inhibitors     cough   Amoxicillin Itching    REACTION: unknown? pain in right kidney   Benicar Hct [Olmesartan Medoxomil-Hctz]     Extreme weakness   Budesonide-Formoterol Fumarate     Causes tremors and numbness   Chlorzoxazone Hives   Chlorzoxazone [Chlorzoxazone]    Citalopram Hydrobromide     REACTION: hives   Citalopram Hydrobromide    Clonidine Derivatives    Cymbalta [Duloxetine Hcl]    Droperidol     REACTION: hives   Flexeril [Cyclobenzaprine Hcl]    Fluconazole Nausea  And Vomiting and Other (See Comments)    fatigue   Formoterol Itching   Gabapentin Itching   Ketorolac Tromethamine     REACTION: hives   Ketorolac Tromethamine    Metoclopramide Hcl    Mometasone Furo-Formoterol Fum     Causes sore throat, blurred vision and unable to sleep--started on med 09-17-10   Mometasone Furoate Itching   Morphine     REACTION: hives and itching   Olmesartan Medoxomil     REACTION: fatique   Breo Ellipta [Fluticasone Furoate-Vilanterol] Itching    Pt reports itching with [employer] is related to being lactose intolerant.     Outpatient  Encounter Medications as of 04/08/2022  Medication Sig   acetaminophen (TYLENOL) 500 MG tablet Take 1,000 mg by mouth 2 (two) times a day.   albuterol (PROAIR HFA) 108 (90 Base) MCG/ACT inhaler INHALE 2 PUFFS EVERY 4 HOURS AS NEEDED INTO THE LUNGS FOR WHEEZING OR SHORTNESS OF BREATH J45.20   amLODipine (NORVASC) 5 MG tablet TAKE 1 TABLET (5 MG TOTAL) BY MOUTH DAILY   cetirizine (ZYRTEC) 10 MG tablet Take 10 mg by mouth daily.   Cholecalciferol 50 MCG (2000 UT) CAPS Take 2,000 Units by mouth in the morning and at bedtime.   ELIQUIS 2.5 MG TABS tablet TAKE 1 TABLET BY MOUTH TWICE A DAY   esomeprazole (NEXIUM) 20 MG capsule Take 1 capsule (20 mg total) by mouth daily. Please schedule a follow up appointment for further refills. Thank you   ezetimibe (ZETIA) 10 MG tablet Take 1 tablet (10 mg total) by mouth daily.   fluticasone-salmeterol (ADVAIR HFA) 115-21 MCG/ACT inhaler Inhale 2 puffs into the lungs 2 (two) times daily.   furosemide (LASIX) 20 MG tablet Take 1 tablet (20 mg total) by mouth daily.   losartan (COZAAR) 25 MG tablet TAKE 1 TABLET BY MOUTH EVERY DAY   Omega-3 Fatty Acids (FISH OIL MAXIMUM STRENGTH) 1200 MG CAPS Take 1,200 mg by mouth 2 (two) times daily.    propranolol (INDERAL) 10 MG tablet Take 1 tablet (10 mg total) by mouth 2 (two) times daily.   Propylene Glycol (SYSTANE BALANCE) 0.6 % SOLN Place 2 drops into both eyes 2 (two) times daily.   sodium chloride (OCEAN) 0.65 % nasal spray Place 1 spray into the nose daily.   tiZANidine (ZANAFLEX) 4 MG tablet Take 4 mg by mouth at bedtime.   No facility-administered encounter medications on file as of 04/08/2022.    Review of Systems  Constitutional:  Negative for appetite change, chills, fatigue, fever and unexpected weight change.  HENT:  Negative for congestion, dental problem, ear discharge, ear pain, facial swelling, hearing loss, nosebleeds, postnasal drip, rhinorrhea, sinus pressure, sinus pain, sneezing, sore throat,  tinnitus and trouble swallowing.   Eyes:  Negative for pain, discharge, redness, itching and visual disturbance.  Respiratory:  Negative for cough, chest tightness, shortness of breath and wheezing.   Cardiovascular:  Negative for chest pain, palpitations and leg swelling.  Gastrointestinal:  Negative for abdominal distention, abdominal pain, blood in stool, constipation, diarrhea, nausea and vomiting.  Endocrine: Negative for cold intolerance, heat intolerance, polydipsia, polyphagia and polyuria.  Genitourinary:  Negative for difficulty urinating, dysuria, flank pain, frequency and urgency.  Musculoskeletal:  Negative for arthralgias, back pain, gait problem, joint swelling, myalgias, neck pain and neck stiffness.  Skin:  Negative for color change, pallor, rash and wound.  Neurological:  Negative for dizziness, syncope, speech difficulty, weakness, light-headedness, numbness and headaches.  Hematological:  Does not  bruise/bleed easily.  Psychiatric/Behavioral:  Negative for agitation, behavioral problems, confusion, hallucinations, self-injury, sleep disturbance and suicidal ideas. The patient is not nervous/anxious.     Immunization History  Administered Date(s) Administered   Fluad Quad(high Dose 65+) 02/27/2021, 02/04/2022   Hepatitis A, Adult 10/15/2017, 04/17/2018   Influenza Split 02/11/2011, 02/25/2012   Influenza Whole 02/10/2009, 02/26/2010   Influenza, High Dose Seasonal PF 04/24/2017, 01/26/2018, 01/26/2018, 01/03/2019, 02/11/2020   Influenza,inj,Quad PF,6+ Mos 02/09/2014, 03/20/2015, 02/27/2016   Influenza-Unspecified 03/13/2013, 03/13/2017   PFIZER Comirnaty(Gray Top)Covid-19 Tri-Sucrose Vaccine 10/17/2020, 02/08/2022   PFIZER(Purple Top)SARS-COV-2 Vaccination 06/03/2019, 06/22/2019, 02/09/2020   Pneumococcal Conjugate-13 02/27/2016   Pneumococcal Polysaccharide-23 08/25/2014   Tdap 12/06/2013   Zoster Recombinat (Shingrix) 01/13/2019, 08/05/2019   Pertinent  Health  Maintenance Due  Topic Date Due   INFLUENZA VACCINE  Completed   DEXA SCAN  Completed   COLONOSCOPY (Pts 45-79yr Insurance coverage will need to be confirmed)  Discontinued      12/21/2021   10:53 AM 01/22/2022    9:50 AM 03/30/2022   12:07 PM 04/02/2022    8:39 AM 04/08/2022    9:31 AM  Fall Risk  Falls in the past year? 0 0  0 0  Was there an injury with Fall? 0 0   0  Fall Risk Category Calculator 0 0   0  Fall Risk Category Low Low   Low  Patient Fall Risk Level Low fall risk Low fall risk Low fall risk  Low fall risk  Patient at Risk for Falls Due to No Fall Risks No Fall Risks   No Fall Risks  Fall risk Follow up Falls evaluation completed Falls evaluation completed   Falls evaluation completed   Functional Status Survey:    Vitals:   04/08/22 0924  BP: 110/60  Pulse: 81  Resp: 16  Temp: 98.1 F (36.7 C)  SpO2: 97%  Weight: 170 lb 12.8 oz (77.5 kg)  Height: 5' 4"  (1.626 m)   Body mass index is 29.32 kg/m. Physical Exam Vitals reviewed.  Constitutional:      General: She is not in acute distress.    Appearance: Normal appearance. She is overweight. She is not ill-appearing or diaphoretic.  HENT:     Head: Normocephalic.     Right Ear: Tympanic membrane, ear canal and external ear normal. There is no impacted cerumen.     Left Ear: Tympanic membrane, ear canal and external ear normal. There is no impacted cerumen.     Nose: Nose normal. No congestion or rhinorrhea.     Mouth/Throat:     Mouth: Mucous membranes are moist.     Pharynx: Oropharynx is clear. No oropharyngeal exudate or posterior oropharyngeal erythema.  Eyes:     General: No scleral icterus.       Right eye: No discharge.        Left eye: No discharge.     Extraocular Movements: Extraocular movements intact.     Conjunctiva/sclera: Conjunctivae normal.     Pupils: Pupils are equal, round, and reactive to light.  Neck:     Vascular: No carotid bruit.  Cardiovascular:     Rate and Rhythm:  Normal rate and regular rhythm.     Pulses: Normal pulses.     Heart sounds: Normal heart sounds. No murmur heard.    No friction rub. No gallop.  Pulmonary:     Effort: Pulmonary effort is normal. No respiratory distress.     Breath sounds: Normal breath sounds. No  wheezing, rhonchi or rales.  Chest:     Chest wall: No tenderness.  Abdominal:     General: Bowel sounds are normal. There is no distension.     Palpations: Abdomen is soft. There is no mass.     Tenderness: There is no abdominal tenderness. There is no right CVA tenderness, left CVA tenderness, guarding or rebound.  Musculoskeletal:        General: No swelling or tenderness. Normal range of motion.     Cervical back: Normal range of motion. No rigidity or tenderness.     Right lower leg: No edema.     Left lower leg: No edema.  Lymphadenopathy:     Cervical: No cervical adenopathy.  Skin:    General: Skin is warm and dry.     Coloration: Skin is not pale.     Findings: No bruising, erythema, lesion or rash.  Neurological:     Mental Status: She is alert and oriented to person, place, and time.     Cranial Nerves: No cranial nerve deficit.     Sensory: No sensory deficit.     Motor: No weakness.     Coordination: Coordination normal.     Gait: Gait normal.  Psychiatric:        Mood and Affect: Mood normal.        Speech: Speech normal.        Behavior: Behavior normal.        Thought Content: Thought content normal.        Judgment: Judgment normal.     Labs reviewed: Recent Labs    01/02/22 1104 03/30/22 1211 04/02/22 0932  NA 140 137 139  K 4.2 4.2 4.0  CL 102 101 100  CO2 25 26 28   GLUCOSE 99 103* 100*  BUN 15 18 19   CREATININE 0.75 0.70 0.64  CALCIUM 9.8 9.5 9.9   Recent Labs    08/13/21 0942 12/21/21 1149 04/02/22 0932  AST 22 24 22   ALT 15 17 17   BILITOT 0.8 0.6 0.8  PROT 6.7 6.6 7.1   Recent Labs    09/28/21 0845 12/21/21 1149 03/30/22 1211 04/02/22 0932  WBC 7.1 8.1 8.6 9.9   NEUTROABS 3.2 3,588  --  6,089  HGB 14.2 14.6 13.9 14.1  HCT 42.5 42.5 41.6 41.6  MCV 90.4 89.1 89.3 88.1  PLT 248 301 327 386   Lab Results  Component Value Date   TSH 1.666 11/22/2019   Lab Results  Component Value Date   HGBA1C 5.5 11/06/2020   Lab Results  Component Value Date   CHOL 212 (H) 11/16/2021   HDL 51 11/16/2021   LDLCALC 134 (H) 11/16/2021   LDLDIRECT 102.5 07/02/2012   TRIG 143 11/16/2021   CHOLHDL 4.2 11/16/2021    Significant Diagnostic Results in last 30 days:  DG Chest 2 View  Result Date: 03/30/2022 CLINICAL DATA:  Shortness of breath EXAM: CHEST - 2 VIEW COMPARISON:  CXR 09/28/21 FINDINGS: No pleural effusion. No pneumothorax. No focal airspace opacity. Normal cardiac and mediastinal contours. There are aortic atherosclerotic calcifications. Visualized upper abdomen is unremarkable. Vertebral body heights are maintained. There are cholecystectomy clips in the right upper quadrant. IMPRESSION: No radiographic finding to explain shortness of breath. Electronically Signed   By: Marin Roberts M.D.   On: 03/30/2022 12:48    Assessment/Plan 1. Chronic obstructive pulmonary disease, unspecified COPD type ([place]) afebrile  To use inhalers at home today to get nebulizer here in the  office seems to work better.  Requests order for nebulizer solution. - ipratropium-albuterol (DUONEB) 0.5-2.5 (3) MG/3ML SOLN; Take 3 mLs by nebulization every 6 (six) hours as needed.  Dispense: 360 mL; Refill: 3 - For home use only DME Other see comment: Nebulizer machine use daily every 6 hours as needed for along with DuoNebs  2. Paroxysmal atrial fibrillation (HCC) Heart rate control Continue Eliquis No signs of bleeding reported.  3. Gastroesophageal reflux disease without esophagitis  Symptoms controlled. H/H stable.No tarry or black stool  - advised to avoid eating meals late in the evening and to avoid aggravating foods and spices. - continue on esomeprazole  4. Mixed  hyperlipidemia LDL not at goal  Continue on omega-3 fatty acids and ezetimibe -Dietary modification and exercise as tolerated advised  Family/ staff Communication: Reviewed plan of care with patient verbalized understanding  Labs/tests ordered: None   Next Appointment: Return if symptoms worsen or fail to improve.   Sandrea Hughs, NP

## 2022-04-11 ENCOUNTER — Other Ambulatory Visit: Payer: Self-pay | Admitting: Nurse Practitioner

## 2022-04-11 DIAGNOSIS — R7303 Prediabetes: Secondary | ICD-10-CM

## 2022-04-11 DIAGNOSIS — I10 Essential (primary) hypertension: Secondary | ICD-10-CM

## 2022-04-11 DIAGNOSIS — R809 Proteinuria, unspecified: Secondary | ICD-10-CM

## 2022-04-17 NOTE — Progress Notes (Unsigned)
Synopsis: Referred in December 2023 for dyspnea, likely COPD  Subjective:   PATIENT ID: [name] GENDER: female DOB: 06-14-1940, MRN: [id number]   HPI  Chief Complaint  Patient presents with   Consult    Consult for COPD. Pt states she was just dx with COPD about 2-3 weeks ago. She has a hx of chronic bronchitis. Pt is on Advair and albuterol daily. Pt states she feels like the Advair is not helping her. Pt states the albuterol does help her. Has been on [place] for 15 years and it was working but now it is not. She was on possible [place] and it did not agree with her and she got really sick. Was on high dose Adviar before and she states that did help. But now she is on low dose. She asks about trelegy    [name] is here to see me for COPD: > when she was 11 she had pneumonia and has had allergies a lot over the years > she says that she had life long asthma problems and if she had allergy attacks she would get short of breath > she has been using Advair high dose for a long time and felt that it helped > her insurance company is not going to cover advair any more > when she was in her 40's she said she enjoyed a long period of no difficulty breathing > however later in life she developed worsening dyspnea with periodic attacks > she says that this year has been a bad year with frequent allergy attacks > she is dealing with much more dyspnea this year, she feels like she can't take a deep breath > she has a mild headache a lot > talking makes her breathing worse > she can walk about 1/2-1 block before she gets short of breath > she can't climb stairs due to knee problems > she uses albuterol but it doesn't help as much as she would like > she feels like Advair helps more than albuterol  Smoked briefly 2 packs a day for 8 years  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    She worked for a Scientist, physiological in town and lost part of her  right middle finger there.  November 2023 record with primary care reviewed where the patient was seen in the context of COPD and treated with DuoNeb  Past Medical History:  Diagnosis Date   (HFpEF) heart failure with preserved ejection fraction ([place]) 12/26/2021   Echocardiogram 12/2021: EF 55-60, no RWMA, Gr 2 DD, low normal RVSF, mildly elevated PASP (RVSP 38.5), mild LAE, trivial MR   ALLERGIC RHINITIS    Anxiety    Asthma    Blood type O+    Cervical strain, acute    Cirrhosis (HCC)    Colon polyp 2010   adenoma   Diverticulosis of colon    Dizziness    Esophageal varices (HCC)    Fibromyalgia    GERD (gastroesophageal reflux disease)    History of nephrolithiasis    Hypercholesterolemia    borderline   Hypertension    IBS (irritable bowel syndrome)    Osteoarthritis    Personal history of allergy to unspecified medicinal agent    Postconcussion syndrome    Vitamin D deficiency      Family History  Problem Relation Age of Onset   Breast cancer Mother    Alzheimer's disease Mother    Ovarian cancer Sister  Heart disease Sister    Breast cancer Sister 68   Alcohol abuse Sister    COPD Brother    Alcohol abuse Brother    Ovarian cancer Paternal Grandmother    Heart attack Maternal Uncle    COPD Cousin        Maternal side    Arthritis Other        Cousins    Colon cancer Neg Hx    Esophageal cancer Neg Hx      Social History   Socioeconomic History   Marital status: Divorced    Spouse name: Not on file   Number of children: 1   Years of education: Not on file   Highest education level: Not on file  Occupational History   Occupation: retired    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, broadcasting  Tobacco Use   Smoking status: Former    Packs/day: 2.00    Years: 8.00    Total pack years: 16.00    Types: Cigarettes    Quit date: 05/13/1962    Years since quitting: 59.9   Smokeless tobacco: Never  Vaping Use   Vaping Use:  Never used  Substance and Sexual Activity   Alcohol use: No   Drug use: No   Sexual activity: Not Currently    Birth control/protection: Post-menopausal  Other Topics Concern   Not on file  Social History Narrative   exercises 3x a week   drinks 1 cup caffeine every other day   Social Determinants of Health   Financial Resource Strain: Low Risk  (07/16/2017)   Overall Financial Resource Strain (CARDIA)    Difficulty of Paying Living Expenses: Not hard at all  Food Insecurity: No Food Insecurity (07/16/2017)   Hunger Vital Sign    Worried About Running Out of Food in the Last Year: Never true    [place] in the Last Year: Never true  Transportation Needs: No Transportation Needs (07/16/2017)   PRAPARE - Hydrologist (Medical): No    Lack of Transportation (Non-Medical): No  Physical Activity: Inactive (07/16/2017)   Exercise Vital Sign    Days of Exercise per Week: 0 days    Minutes of Exercise per Session: 0 min  Stress: Stress Concern Present (07/16/2017)   [place]    Feeling of Stress : Rather much  Social Connections: Somewhat Isolated (07/16/2017)   Social Connection and Isolation Panel [NHANES]    Frequency of Communication with Friends and Family: More than three times a week    Frequency of Social Gatherings with Friends and Family: More than three times a week    Attends Religious Services: More than 4 times per year    Active Member of [employer] or Organizations: No    Attends Archivist Meetings: Never    Marital Status: Divorced  Human resources officer Violence: Not At Risk (07/16/2017)   Humiliation, Afraid, Rape, and Kick questionnaire    Fear of Current or Ex-Partner: No    Emotionally Abused: No    Physically Abused: No    Sexually Abused: No     Allergies  Allergen Reactions   [place] Inhub [Fluticasone-Salmeterol] Shortness Of Breath   Ace Inhibitors     cough    Amoxicillin Itching    REACTION: unknown? pain in right kidney   Benicar Hct [Olmesartan Medoxomil-Hctz]     Extreme weakness  Budesonide-Formoterol Fumarate     Causes tremors and numbness   Chlorzoxazone Hives   Chlorzoxazone [Chlorzoxazone]    Citalopram Hydrobromide     REACTION: hives   Citalopram Hydrobromide    Clonidine Derivatives    Cymbalta [Duloxetine Hcl]    Droperidol     REACTION: hives   Flexeril [Cyclobenzaprine Hcl]    Fluconazole Nausea And Vomiting and Other (See Comments)    fatigue   Formoterol Itching   Gabapentin Itching   Ketorolac Tromethamine     REACTION: hives   Ketorolac Tromethamine    Metoclopramide Hcl    Mometasone Furo-Formoterol Fum     Causes sore throat, blurred vision and unable to sleep--started on med 09-17-10   Mometasone Furoate Itching   Morphine     REACTION: hives and itching   Olmesartan Medoxomil     REACTION: fatique   Breo Ellipta [Fluticasone Furoate-Vilanterol] Itching    Pt reports itching with [place] is related to being lactose intolerant.      Outpatient Medications Prior to Visit  Medication Sig Dispense Refill   acetaminophen (TYLENOL) 500 MG tablet Take 1,000 mg by mouth 2 (two) times a day.     albuterol (PROAIR HFA) 108 (90 Base) MCG/ACT inhaler INHALE 2 PUFFS EVERY 4 HOURS AS NEEDED INTO THE LUNGS FOR WHEEZING OR SHORTNESS OF BREATH J45.20 25.5 g 5   amLODipine (NORVASC) 5 MG tablet TAKE 1 TABLET (5 MG TOTAL) BY MOUTH DAILY 90 tablet 1   cetirizine (ZYRTEC) 10 MG tablet Take 10 mg by mouth daily.     Cholecalciferol 50 MCG (2000 UT) CAPS Take 2,000 Units by mouth in the morning and at bedtime.     ELIQUIS 2.5 MG TABS tablet TAKE 1 TABLET BY MOUTH TWICE A DAY 60 tablet 5   esomeprazole (NEXIUM) 20 MG capsule Take 1 capsule (20 mg total) by mouth daily. Please schedule a follow up appointment for further refills. Thank you 30 capsule 1   ezetimibe (ZETIA) 10 MG tablet Take 1 tablet (10 mg total) by mouth daily. 90  tablet 3   fluticasone-salmeterol (ADVAIR HFA) 115-21 MCG/ACT inhaler Inhale 2 puffs into the lungs 2 (two) times daily. 1 each 12   furosemide (LASIX) 20 MG tablet Take 1 tablet (20 mg total) by mouth daily. 90 tablet 3   ipratropium-albuterol (DUONEB) 0.5-2.5 (3) MG/3ML SOLN Take 3 mLs by nebulization every 6 (six) hours as needed. 360 mL 3   losartan (COZAAR) 25 MG tablet TAKE 1 TABLET BY MOUTH EVERY DAY 90 tablet 3   Omega-3 Fatty Acids (FISH OIL MAXIMUM STRENGTH) 1200 MG CAPS Take 1,200 mg by mouth 2 (two) times daily.      propranolol (INDERAL) 10 MG tablet Take 1 tablet (10 mg total) by mouth 2 (two) times daily. 60 tablet 11   Propylene Glycol (SYSTANE BALANCE) 0.6 % SOLN Place 2 drops into both eyes 2 (two) times daily. 15 mL 0   sodium chloride (OCEAN) 0.65 % nasal spray Place 1 spray into the nose daily.     tiZANidine (ZANAFLEX) 4 MG tablet Take 4 mg by mouth at bedtime.     No facility-administered medications prior to visit.    Review of Systems  Constitutional:  Negative for chills, fever, malaise/fatigue and weight loss.  HENT:  Positive for congestion. Negative for nosebleeds, sinus pain and sore throat.   Eyes:  Negative for photophobia, pain and discharge.  Respiratory:  Positive for cough, shortness of breath and wheezing. Negative  for hemoptysis and sputum production.   Cardiovascular:  Negative for chest pain, palpitations, orthopnea and leg swelling.  Gastrointestinal:  Negative for abdominal pain, constipation, diarrhea, nausea and vomiting.  Genitourinary:  Negative for dysuria, frequency, hematuria and urgency.  Musculoskeletal:  Negative for back pain, joint pain, myalgias and neck pain.  Skin:  Negative for itching and rash.  Neurological:  Negative for tingling, tremors, sensory change, speech change, focal weakness, seizures, weakness and headaches.  Psychiatric/Behavioral:  Negative for memory loss, substance abuse and suicidal ideas. The patient is not  nervous/anxious.       Objective:  Physical Exam   Vitals:   04/19/22 0925  BP: 126/70  Pulse: 69  Temp: 98.4 F (36.9 C)  TempSrc: Oral  SpO2: 97%  Weight: 171 lb 9.6 oz (77.8 kg)  Height: 5' 1"  (1.549 m)    Gen: chronically ill appearing, no acute distress HENT: NCAT, OP clear, neck supple without masses Eyes: PERRL, EOMi Lymph: no cervical lymphadenopathy PULM: Slight wheeze left lower lobe, otherwise clear CV: RRR, no mgr, no JVD GI: BS+, soft, nontender, no hsm Derm: no rash or skin breakdown MSK: bilateral knee brace Neuro: A&Ox4, CN II-XII intact, strength 5/5 in all 4 extremities Psyche: normal mood and affect   CBC    Component Value Date/Time   WBC 9.9 04/02/2022 0932   RBC 4.72 04/02/2022 0932   HGB 14.1 04/02/2022 0932   HGB 14.5 03/30/2015 1032   HCT 41.6 04/02/2022 0932   HCT 42.0 03/30/2015 1032   PLT 386 04/02/2022 0932   PLT 385 (H) 03/30/2015 1032   MCV 88.1 04/02/2022 0932   MCV 84 03/30/2015 1032   MCH 29.9 04/02/2022 0932   MCHC 33.9 04/02/2022 0932   RDW 13.1 04/02/2022 0932   RDW 14.1 03/30/2015 1032   LYMPHSABS 3,029 04/02/2022 0932   LYMPHSABS 2.7 03/30/2015 1032   MONOABS 0.7 09/28/2021 0845   EOSABS 0 (L) 04/02/2022 0932   EOSABS 0.1 03/30/2015 1032   BASOSABS 10 04/02/2022 0932   BASOSABS 0.1 03/30/2015 1032     Chest imaging: 2019 CT chest images independently reviewed showing normal pulmonary parenchyma November 2023 chest x-ray images independently reviewed showing increased AP diameter, flattened diaphragms, normal pulmonary parenchyma  PFT: 2012 pulmonary function test: Ratio 81%, FVC 2.62 L 94% predicted, total lung capacity 4.80 L 96% predicted, DLCO 21.7 67% predicted  Labs: 03/2022 Hgb 14.1 gm/dL  Path:  Echo:' 12/2021 TTE> LVEF 00-34%, Grade 2 diastolic dysfunction, LA dilated, RVSP 38.5, trival MR, No aortic stenosis,   Heart Catheterization:       Assessment & Plan:   Dyspnea on  exertion  Allergic rhinitis, unspecified seasonality, unspecified trigger  Chronic heart failure with preserved ejection fraction (HCC)  Other secondary pulmonary hypertension (HCC)  Discussion: Adlyn presents with a diagnosis of COPD but I am not sure I agree with that.  She may have persistent asthma of undetermined severity.  Her smoking history was minimal.  Prior lung function testing from 11 years ago did not show COPD.  I explained to her that the differential diagnosis of dyspnea is broad and includes heart disease, lung disease among other things.  In her particular case she does have heart failure and pulmonary hypertension but she continues to complain of dyspnea despite adequate volume removal recently.  So I think it is reasonable to look for evidence of lung disease but I am not certain that I will find COPD.  Plan: Shortness of breath: Full  lung function test Ambulatory oximetry monitoring today in the office Stop Advair Trial of Trelegy 1 puff daily no matter how you feel Continue to use albuterol as needed for chest tightness wheezing or shortness of breath  Allergic rhinitis, flonase intolerance (epistaxis) Continue zyrtec  Pulmonary hypertension: Depending on the results of the pulmonary function test we may need to get a high-resolution CT scan of the chest to assess her lungs further Keep taking diuretic therapy as you are doing  We will see you back in 6 weeks or sooner if needed   Immunizations: Immunization History  Administered Date(s) Administered   Fluad Quad(high Dose 65+) 02/27/2021, 02/04/2022   Hepatitis A, Adult 10/15/2017, 04/17/2018   Influenza Split 02/11/2011, 02/25/2012   Influenza Whole 02/10/2009, 02/26/2010   Influenza, High Dose Seasonal PF 04/24/2017, 01/26/2018, 01/26/2018, 01/03/2019, 02/11/2020   Influenza,inj,Quad PF,6+ Mos 02/09/2014, 03/20/2015, 02/27/2016   Influenza-Unspecified 03/13/2013, 03/13/2017   PFIZER Comirnaty(Gray  Top)Covid-19 Tri-Sucrose Vaccine 10/17/2020, 02/08/2022   PFIZER(Purple Top)SARS-COV-2 Vaccination 06/03/2019, 06/22/2019, 02/09/2020   Pneumococcal Conjugate-13 02/27/2016   Pneumococcal Polysaccharide-23 08/25/2014   Tdap 12/06/2013   Zoster Recombinat (Shingrix) 01/13/2019, 08/05/2019     Current Outpatient Medications:    acetaminophen (TYLENOL) 500 MG tablet, Take 1,000 mg by mouth 2 (two) times a day., Disp: , Rfl:    albuterol (PROAIR HFA) 108 (90 Base) MCG/ACT inhaler, INHALE 2 PUFFS EVERY 4 HOURS AS NEEDED INTO THE LUNGS FOR WHEEZING OR SHORTNESS OF BREATH J45.20, Disp: 25.5 g, Rfl: 5   amLODipine (NORVASC) 5 MG tablet, TAKE 1 TABLET (5 MG TOTAL) BY MOUTH DAILY, Disp: 90 tablet, Rfl: 1   cetirizine (ZYRTEC) 10 MG tablet, Take 10 mg by mouth daily., Disp: , Rfl:    Cholecalciferol 50 MCG (2000 UT) CAPS, Take 2,000 Units by mouth in the morning and at bedtime., Disp: , Rfl:    ELIQUIS 2.5 MG TABS tablet, TAKE 1 TABLET BY MOUTH TWICE A DAY, Disp: 60 tablet, Rfl: 5   esomeprazole (NEXIUM) 20 MG capsule, Take 1 capsule (20 mg total) by mouth daily. Please schedule a follow up appointment for further refills. Thank you, Disp: 30 capsule, Rfl: 1   ezetimibe (ZETIA) 10 MG tablet, Take 1 tablet (10 mg total) by mouth daily., Disp: 90 tablet, Rfl: 3   fluticasone-salmeterol (ADVAIR HFA) 115-21 MCG/ACT inhaler, Inhale 2 puffs into the lungs 2 (two) times daily., Disp: 1 each, Rfl: 12   furosemide (LASIX) 20 MG tablet, Take 1 tablet (20 mg total) by mouth daily., Disp: 90 tablet, Rfl: 3   ipratropium-albuterol (DUONEB) 0.5-2.5 (3) MG/3ML SOLN, Take 3 mLs by nebulization every 6 (six) hours as needed., Disp: 360 mL, Rfl: 3   losartan (COZAAR) 25 MG tablet, TAKE 1 TABLET BY MOUTH EVERY DAY, Disp: 90 tablet, Rfl: 3   Omega-3 Fatty Acids (FISH OIL MAXIMUM STRENGTH) 1200 MG CAPS, Take 1,200 mg by mouth 2 (two) times daily. , Disp: , Rfl:    propranolol (INDERAL) 10 MG tablet, Take 1 tablet (10 mg  total) by mouth 2 (two) times daily., Disp: 60 tablet, Rfl: 11   Propylene Glycol (SYSTANE BALANCE) 0.6 % SOLN, Place 2 drops into both eyes 2 (two) times daily., Disp: 15 mL, Rfl: 0   sodium chloride (OCEAN) 0.65 % nasal spray, Place 1 spray into the nose daily., Disp: , Rfl:    tiZANidine (ZANAFLEX) 4 MG tablet, Take 4 mg by mouth at bedtime., Disp: , Rfl:

## 2022-04-19 ENCOUNTER — Ambulatory Visit (INDEPENDENT_AMBULATORY_CARE_PROVIDER_SITE_OTHER): Payer: PPO | Admitting: Pulmonary Disease

## 2022-04-19 ENCOUNTER — Encounter: Payer: Self-pay | Admitting: Pulmonary Disease

## 2022-04-19 VITALS — BP 126/70 | HR 69 | Temp 98.4°F | Ht 61.0 in | Wt 171.6 lb

## 2022-04-19 DIAGNOSIS — I2729 Other secondary pulmonary hypertension: Secondary | ICD-10-CM

## 2022-04-19 DIAGNOSIS — J309 Allergic rhinitis, unspecified: Secondary | ICD-10-CM

## 2022-04-19 DIAGNOSIS — R0609 Other forms of dyspnea: Secondary | ICD-10-CM | POA: Diagnosis not present

## 2022-04-19 DIAGNOSIS — I5032 Chronic diastolic (congestive) heart failure: Secondary | ICD-10-CM

## 2022-04-19 MED ORDER — TRELEGY ELLIPTA 100-62.5-25 MCG/ACT IN AEPB: 1.0000 | INHALATION_SPRAY | Freq: Every day | RESPIRATORY_TRACT | 0 refills | Status: DC

## 2022-04-19 NOTE — Patient Instructions (Addendum)
Shortness of breath: Full lung function test Ambulatory oximetry monitoring today in the office Stop Advair Trial of Trelegy 1 puff daily no matter how you feel Continue to use albuterol as needed for chest tightness wheezing or shortness of breath  Allergic rhinitis, flonase intolerance (epistaxis) Continue zyrtec  Pulmonary hypertension: Depending on the results of the pulmonary function test we may need to get a high-resolution CT scan of the chest to assess her lungs further Keep taking diuretic therapy as you are doing  We will see you back in 6 weeks or sooner if needed

## 2022-04-22 ENCOUNTER — Encounter: Payer: Self-pay | Admitting: Pulmonary Disease

## 2022-05-07 ENCOUNTER — Ambulatory Visit (INDEPENDENT_AMBULATORY_CARE_PROVIDER_SITE_OTHER): Payer: PPO | Admitting: Pulmonary Disease

## 2022-05-07 DIAGNOSIS — R0609 Other forms of dyspnea: Secondary | ICD-10-CM

## 2022-05-07 LAB — PULMONARY FUNCTION TEST
DL/VA % pred: 106 %
DL/VA: 4.35 ml/min/mmHg/L
DLCO cor % pred: 111 %
DLCO cor: 20.09 ml/min/mmHg
DLCO unc % pred: 114 %
DLCO unc: 20.51 ml/min/mmHg
FEF 25-75 Post: 2.51 L/sec
FEF 25-75 Pre: 2.51 L/sec
FEF2575-%Change-Post: 0 %
FEF2575-%Pred-Post: 210 %
FEF2575-%Pred-Pre: 210 %
FEV1-%Change-Post: 0 %
FEV1-%Pred-Post: 111 %
FEV1-%Pred-Pre: 110 %
FEV1-Post: 1.94 L
FEV1-Pre: 1.93 L
FEV1FVC-%Change-Post: -2 %
FEV1FVC-%Pred-Pre: 122 %
FEV6-%Change-Post: 3 %
FEV6-%Pred-Post: 100 %
FEV6-%Pred-Pre: 97 %
FEV6-Post: 2.23 L
FEV6-Pre: 2.16 L
FEV6FVC-%Pred-Post: 106 %
FEV6FVC-%Pred-Pre: 106 %
FVC-%Change-Post: 3 %
FVC-%Pred-Post: 94 %
FVC-%Pred-Pre: 91 %
FVC-Post: 2.23 L
FVC-Pre: 2.16 L
Post FEV1/FVC ratio: 87 %
Post FEV6/FVC ratio: 100 %
Pre FEV1/FVC ratio: 90 %
Pre FEV6/FVC Ratio: 100 %
RV % pred: 79 %
RV: 1.9 L
TLC % pred: 85 %
TLC: 4.19 L

## 2022-05-07 NOTE — Patient Instructions (Signed)
Full PFT Performed Today  

## 2022-05-07 NOTE — Progress Notes (Signed)
Full PFT Performed Today  

## 2022-05-10 DIAGNOSIS — M25562 Pain in left knee: Secondary | ICD-10-CM | POA: Diagnosis not present

## 2022-05-10 DIAGNOSIS — M25561 Pain in right knee: Secondary | ICD-10-CM | POA: Diagnosis not present

## 2022-05-14 ENCOUNTER — Other Ambulatory Visit: Payer: Self-pay

## 2022-05-14 DIAGNOSIS — K219 Gastro-esophageal reflux disease without esophagitis: Secondary | ICD-10-CM

## 2022-05-14 MED ORDER — ESOMEPRAZOLE MAGNESIUM 20 MG PO CPDR: 20.0000 mg | DELAYED_RELEASE_CAPSULE | Freq: Every day | ORAL | 5 refills | Status: DC

## 2022-05-15 ENCOUNTER — Other Ambulatory Visit: Payer: Self-pay

## 2022-05-15 ENCOUNTER — Telehealth: Payer: Self-pay | Admitting: Pulmonary Disease

## 2022-05-15 MED ORDER — TRELEGY ELLIPTA 100-62.5-25 MCG/ACT IN AEPB: 1.0000 | INHALATION_SPRAY | Freq: Every day | RESPIRATORY_TRACT | 5 refills | Status: DC

## 2022-05-15 MED ORDER — TRELEGY ELLIPTA 100-62.5-25 MCG/ACT IN AEPB: 1.0000 | INHALATION_SPRAY | Freq: Every day | RESPIRATORY_TRACT | 0 refills | Status: DC

## 2022-05-15 NOTE — Addendum Note (Signed)
Addended by: Meredith Staggers A on: 05/15/2022 09:35 AM   Modules accepted: Orders

## 2022-05-15 NOTE — Telephone Encounter (Signed)
Called and spoke with patient. Refills of Trelegy has been sent in to her pharmacy. She verbalized understanding. Nothing further needed.

## 2022-05-28 ENCOUNTER — Ambulatory Visit (INDEPENDENT_AMBULATORY_CARE_PROVIDER_SITE_OTHER): Payer: PPO | Admitting: Pulmonary Disease

## 2022-05-28 ENCOUNTER — Encounter: Payer: Self-pay | Admitting: Pulmonary Disease

## 2022-05-28 VITALS — BP 134/72 | HR 63 | Temp 98.3°F | Ht 61.0 in | Wt 175.8 lb

## 2022-05-28 DIAGNOSIS — J42 Unspecified chronic bronchitis: Secondary | ICD-10-CM

## 2022-05-28 MED ORDER — RSVPREF3 VAC RECOMB ADJUVANTED 120 MCG/0.5ML IM SUSR: 0.5000 mL | Freq: Once | INTRAMUSCULAR | 0 refills | Status: AC

## 2022-05-28 NOTE — Progress Notes (Signed)
Synopsis: Referred in December 2023 for chronic bronchitis Normal PFT, significant improvement with Trelegy Minimal smoking history  Subjective:   PATIENT ID: [name] GENDER: female DOB: 06-18-1940, MRN: [id number]   HPI  Chief Complaint  Patient presents with   Follow-up    Pft 12/26  Pt states trelegy is working for her  Cut nebulizer intake down     Railyn says that her breathing has improved significantly since starting the Trelegy.  Breathing is improved a lot.  Minimal cough.  She's only needed to use albuterol nebulized 3 times since the last few weeks.  She says that the Trelegy is only costing her $11/months.  Past Medical History:  Diagnosis Date   (HFpEF) heart failure with preserved ejection fraction ([place]) 12/26/2021   Echocardiogram 12/2021: EF 55-60, no RWMA, Gr 2 DD, low normal RVSF, mildly elevated PASP (RVSP 38.5), mild LAE, trivial MR   ALLERGIC RHINITIS    Anxiety    Asthma    Blood type O+    Cervical strain, acute    Cirrhosis ([place])    Colon polyp 2010   adenoma   Diverticulosis of colon    Dizziness    Esophageal varices (HCC)    Fibromyalgia    GERD (gastroesophageal reflux disease)    History of nephrolithiasis    Hypercholesterolemia    borderline   Hypertension    IBS (irritable bowel syndrome)    Osteoarthritis    Personal history of allergy to unspecified medicinal agent    Postconcussion syndrome    Vitamin D deficiency          Review of Systems  Constitutional:  Negative for chills, fever, malaise/fatigue and weight loss.  HENT:  Negative for congestion, sinus pain and sore throat.   Respiratory:  Negative for cough, sputum production and shortness of breath.   Cardiovascular:  Negative for chest pain and leg swelling.     Objective:  Physical Exam   Vitals:   05/28/22 1326  BP: 134/72  Pulse: 63  Temp: 98.3 F (36.8 C)  TempSrc: Oral  SpO2: 97%  Weight: 175 lb 12.8 oz (79.7 kg)  Height: '5\' 1"'$  (1.549 m)      Gen: well appearing HENT: OP clear, neck supple PULM: CTA B, normal effort  CV: RRR, no mgr GI: BS+, soft, nontender Derm: no cyanosis or rash Psyche: normal mood and affect    CBC    Component Value Date/Time   WBC 9.9 04/02/2022 0932   RBC 4.72 04/02/2022 0932   HGB 14.1 04/02/2022 0932   HGB 14.5 03/30/2015 1032   HCT 41.6 04/02/2022 0932   HCT 42.0 03/30/2015 1032   PLT 386 04/02/2022 0932   PLT 385 (H) 03/30/2015 1032   MCV 88.1 04/02/2022 0932   MCV 84 03/30/2015 1032   MCH 29.9 04/02/2022 0932   MCHC 33.9 04/02/2022 0932   RDW 13.1 04/02/2022 0932   RDW 14.1 03/30/2015 1032   LYMPHSABS 3,029 04/02/2022 0932   LYMPHSABS 2.7 03/30/2015 1032   MONOABS 0.7 09/28/2021 0845   EOSABS 0 (L) 04/02/2022 0932   EOSABS 0.1 03/30/2015 1032   BASOSABS 10 04/02/2022 0932   BASOSABS 0.1 03/30/2015 1032     Chest imaging: 2019 CT chest images independently reviewed showing normal pulmonary parenchyma November 2023 chest x-ray images independently reviewed showing increased AP diameter, flattened diaphragms, normal pulmonary parenchyma  PFT: 2012 pulmonary function test: Ratio 81%, FVC 2.62 L 94% predicted, total lung capacity 4.80 L  96% predicted, DLCO 21.7 67% predicted 04/2022 PFT: Ratio 90% FVC 2.23 L 94% predicted, total lung capacity 4.19 L 85% predicted, DLCO 20.51 114% predicted  Labs: 03/2022 Hgb 14.1 gm/dL  Path:  Echo:' 12/2021 TTE> LVEF 70-26%, Grade 2 diastolic dysfunction, LA dilated, RVSP 38.5, trival MR, No aortic stenosis,   Heart Catheterization:       Assessment & Plan:   Chronic bronchitis, unspecified chronic bronchitis type ([place])  Discussion: Tara has normal lung function testing, normal chest imaging but has had significant improvement in her chronic daily cough with mucus production and shortness of breath since starting Trelegy.  She has chronic bronchitis and may have an asthma overlap syndrome though lung function testing showed  no evidence of airflow obstruction or bronchodilator response.  In general considering the normalcy of her objective testing I would normally consider stopping or stepping down her Trelegy but because she has had such a dramatic response and its of minimal cost to her I think it is reasonable to continue it for now.  Plan: Chronic bronchitis: Continue Trelegy 1 puff daily no matter how you feel Use albuterol as needed for chest tightness wheezing or shortness of breath Practice good hand hygiene Stay physically active RSV vaccine today  Follow-up with me in 1 year or sooner if needed.   Immunizations: Immunization History  Administered Date(s) Administered   Fluad Quad(high Dose 65+) 02/27/2021, 02/04/2022   Hepatitis A, Adult 10/15/2017, 04/17/2018   Influenza Split 02/11/2011, 02/25/2012   Influenza Whole 02/10/2009, 02/26/2010   Influenza, High Dose Seasonal PF 04/24/2017, 01/26/2018, 01/26/2018, 01/03/2019, 02/11/2020   Influenza,inj,Quad PF,6+ Mos 02/09/2014, 03/20/2015, 02/27/2016   Influenza-Unspecified 03/13/2013, 03/13/2017   PFIZER Comirnaty(Gray Top)Covid-19 Tri-Sucrose Vaccine 10/17/2020, 02/08/2022   PFIZER(Purple Top)SARS-COV-2 Vaccination 06/03/2019, 06/22/2019, 02/09/2020   Pneumococcal Conjugate-13 02/27/2016   Pneumococcal Polysaccharide-23 08/25/2014   Tdap 12/06/2013   Zoster Recombinat (Shingrix) 01/13/2019, 08/05/2019     Current Outpatient Medications:    acetaminophen (TYLENOL) 500 MG tablet, Take 1,000 mg by mouth 2 (two) times a day., Disp: , Rfl:    albuterol (PROAIR HFA) 108 (90 Base) MCG/ACT inhaler, INHALE 2 PUFFS EVERY 4 HOURS AS NEEDED INTO THE LUNGS FOR WHEEZING OR SHORTNESS OF BREATH J45.20, Disp: 25.5 g, Rfl: 5   amLODipine (NORVASC) 5 MG tablet, TAKE 1 TABLET (5 MG TOTAL) BY MOUTH DAILY, Disp: 90 tablet, Rfl: 1   cetirizine (ZYRTEC) 10 MG tablet, Take 10 mg by mouth daily., Disp: , Rfl:    Cholecalciferol 50 MCG (2000 UT) CAPS, Take 2,000  Units by mouth in the morning and at bedtime., Disp: , Rfl:    ELIQUIS 2.5 MG TABS tablet, TAKE 1 TABLET BY MOUTH TWICE A DAY, Disp: 60 tablet, Rfl: 5   esomeprazole (NEXIUM) 20 MG capsule, Take 1 capsule (20 mg total) by mouth daily., Disp: 30 capsule, Rfl: 5   ezetimibe (ZETIA) 10 MG tablet, Take 1 tablet (10 mg total) by mouth daily., Disp: 90 tablet, Rfl: 3   furosemide (LASIX) 20 MG tablet, Take 1 tablet (20 mg total) by mouth daily., Disp: 90 tablet, Rfl: 3   ipratropium-albuterol (DUONEB) 0.5-2.5 (3) MG/3ML SOLN, Take 3 mLs by nebulization every 6 (six) hours as needed., Disp: 360 mL, Rfl: 3   losartan (COZAAR) 25 MG tablet, TAKE 1 TABLET BY MOUTH EVERY DAY, Disp: 90 tablet, Rfl: 3   Omega-3 Fatty Acids (FISH OIL MAXIMUM STRENGTH) 1200 MG CAPS, Take 1,200 mg by mouth 2 (two) times daily. , Disp: , Rfl:  propranolol (INDERAL) 10 MG tablet, Take 1 tablet (10 mg total) by mouth 2 (two) times daily., Disp: 60 tablet, Rfl: 11   Propylene Glycol (SYSTANE BALANCE) 0.6 % SOLN, Place 2 drops into both eyes 2 (two) times daily., Disp: 15 mL, Rfl: 0   sodium chloride (OCEAN) 0.65 % nasal spray, Place 1 spray into the nose daily., Disp: , Rfl:    tiZANidine (ZANAFLEX) 4 MG tablet, Take 4 mg by mouth at bedtime., Disp: , Rfl:    Fluticasone-Umeclidin-Vilant (TRELEGY ELLIPTA) 100-62.5-25 MCG/ACT AEPB, Inhale 1 Inhalation into the lungs daily. (Patient not taking: Reported on 05/28/2022), Disp: 1 each, Rfl: 5

## 2022-05-28 NOTE — Patient Instructions (Signed)
Chronic bronchitis: Continue Trelegy 1 puff daily no matter how you feel Use albuterol as needed for chest tightness wheezing or shortness of breath Practice good hand hygiene Stay physically active RSV vaccine today  Follow-up with me in 1 year or sooner if needed.

## 2022-05-30 ENCOUNTER — Telehealth: Payer: PPO | Admitting: Nurse Practitioner

## 2022-06-10 ENCOUNTER — Other Ambulatory Visit: Payer: Self-pay | Admitting: Nurse Practitioner

## 2022-06-10 NOTE — Telephone Encounter (Signed)
High risk or very high risk warning populated when attempting to refill medication. RX request sent to PCP for review and approval if warranted.   

## 2022-06-21 DIAGNOSIS — M533 Sacrococcygeal disorders, not elsewhere classified: Secondary | ICD-10-CM | POA: Diagnosis not present

## 2022-07-16 ENCOUNTER — Other Ambulatory Visit: Payer: PPO

## 2022-07-23 DIAGNOSIS — M533 Sacrococcygeal disorders, not elsewhere classified: Secondary | ICD-10-CM | POA: Diagnosis not present

## 2022-08-08 DIAGNOSIS — M533 Sacrococcygeal disorders, not elsewhere classified: Secondary | ICD-10-CM | POA: Diagnosis not present

## 2022-08-08 DIAGNOSIS — M545 Low back pain, unspecified: Secondary | ICD-10-CM | POA: Diagnosis not present

## 2022-08-12 ENCOUNTER — Encounter: Payer: Self-pay | Admitting: Adult Health

## 2022-08-12 ENCOUNTER — Ambulatory Visit (INDEPENDENT_AMBULATORY_CARE_PROVIDER_SITE_OTHER): Payer: PPO | Admitting: Adult Health

## 2022-08-12 VITALS — BP 129/80 | HR 66 | Temp 97.5°F | Resp 18 | Ht 61.0 in | Wt 182.4 lb

## 2022-08-12 DIAGNOSIS — R1013 Epigastric pain: Secondary | ICD-10-CM | POA: Diagnosis not present

## 2022-08-12 DIAGNOSIS — M170 Bilateral primary osteoarthritis of knee: Secondary | ICD-10-CM | POA: Diagnosis not present

## 2022-08-12 DIAGNOSIS — G8929 Other chronic pain: Secondary | ICD-10-CM

## 2022-08-12 DIAGNOSIS — M545 Low back pain, unspecified: Secondary | ICD-10-CM

## 2022-08-12 DIAGNOSIS — K219 Gastro-esophageal reflux disease without esophagitis: Secondary | ICD-10-CM | POA: Diagnosis not present

## 2022-08-12 LAB — POCT URINALYSIS DIPSTICK
Bilirubin, UA: NEGATIVE
Blood, UA: NEGATIVE
Glucose, UA: NEGATIVE
Leukocytes, UA: NEGATIVE
Nitrite, UA: NEGATIVE
Protein, UA: POSITIVE — AB
Spec Grav, UA: 1.015 (ref 1.010–1.025)
Urobilinogen, UA: 0.2 E.U./dL
pH, UA: 6 (ref 5.0–8.0)

## 2022-08-12 NOTE — Progress Notes (Signed)
[facility]  Provider:  Durenda Age DNP  Code Status:  DNR  Goals of Care:     04/08/2022    9:31 AM  Advanced Directives  Does Patient Have a Medical Advance Directive? Yes  Type of Advance Directive Out of facility DNR (pink MOST or yellow form)  Does patient want to make changes to medical advance directive? No - Patient declined     Chief Complaint  Patient presents with   Medication Management    Pain and Medication    HPI: Patient is a 82 y.o. female seen today for an acute visit for pain and medication. She complains of lower back and bilateral hip pain, 10/10. She had antiinflammatory injection of  her bilateral hips 5 days ago. She complains of epigastric pain after eating which started 2 weeks ago. She denies having fever nor hematuria. She has occasional chills. She is currently taking Nexium for GERD. She wears bilateral knee braces for knee pain, has bilateral knee osteoarthritis.  Past Medical History:  Diagnosis Date   (HFpEF) heart failure with preserved ejection fraction 12/26/2021   Echocardiogram 12/2021: EF 55-60, no RWMA, Gr 2 DD, low normal RVSF, mildly elevated PASP (RVSP 38.5), mild LAE, trivial MR   ALLERGIC RHINITIS    Anxiety    Asthma    Blood type O+    Cervical strain, acute    Cirrhosis    Colon polyp 2010   adenoma   Diverticulosis of colon    Dizziness    Esophageal varices    Fibromyalgia    GERD (gastroesophageal reflux disease)    History of nephrolithiasis    Hypercholesterolemia    borderline   Hypertension    IBS (irritable bowel syndrome)    Osteoarthritis    Personal history of allergy to unspecified medicinal agent    Postconcussion syndrome    Vitamin D deficiency     Past Surgical History:  Procedure Laterality Date   CHOLECYSTECTOMY, LAPAROSCOPIC  2004   for gallstone pancreatitis   KNEE SURGERY Right    KNEE SURGERY Left    THYROID SURGERY     cyst removed    Allergies  Allergen Reactions   Wixela  Inhub [Fluticasone-Salmeterol] Shortness Of Breath   Ace Inhibitors     cough   Amoxicillin Itching    REACTION: unknown? pain in right kidney   Benicar Hct [Olmesartan Medoxomil-Hctz]     Extreme weakness   Budesonide-Formoterol Fumarate     Causes tremors and numbness   Chlorzoxazone Hives   Chlorzoxazone [Chlorzoxazone]    Citalopram Hydrobromide     REACTION: hives   Citalopram Hydrobromide    Clonidine Derivatives    Cymbalta [Duloxetine Hcl]    Droperidol     REACTION: hives   Flexeril [Cyclobenzaprine Hcl]    Fluconazole Nausea And Vomiting and Other (See Comments)    fatigue   Formoterol Itching   Gabapentin Itching   Ketorolac Tromethamine     REACTION: hives   Ketorolac Tromethamine    Metoclopramide Hcl    Mometasone Furo-Formoterol Fum     Causes sore throat, blurred vision and unable to sleep--started on med 09-17-10   Mometasone Furoate Itching   Morphine     REACTION: hives and itching   Olmesartan Medoxomil     REACTION: fatique   Breo Ellipta [Fluticasone Furoate-Vilanterol] Itching    Pt reports itching with Memory Dance is related to being lactose intolerant.     Outpatient Encounter Medications as of 08/12/2022  Medication Sig   acetaminophen (TYLENOL) 500 MG tablet Take 1,000 mg by mouth 2 (two) times a day.   albuterol (PROAIR HFA) 108 (90 Base) MCG/ACT inhaler INHALE 2 PUFFS EVERY 4 HOURS AS NEEDED INTO THE LUNGS FOR WHEEZING OR SHORTNESS OF BREATH J45.20   amLODipine (NORVASC) 5 MG tablet TAKE 1 TABLET (5 MG TOTAL) BY MOUTH DAILY   cetirizine (ZYRTEC) 10 MG tablet Take 10 mg by mouth daily.   Cholecalciferol 50 MCG (2000 UT) CAPS Take 2,000 Units by mouth in the morning and at bedtime.   ELIQUIS 2.5 MG TABS tablet TAKE 1 TABLET BY MOUTH TWICE A DAY   esomeprazole (NEXIUM) 20 MG capsule Take 1 capsule (20 mg total) by mouth daily.   ezetimibe (ZETIA) 10 MG tablet Take 1 tablet (10 mg total) by mouth daily.   furosemide (LASIX) 20 MG tablet Take 1 tablet (20  mg total) by mouth daily.   ipratropium-albuterol (DUONEB) 0.5-2.5 (3) MG/3ML SOLN Take 3 mLs by nebulization every 6 (six) hours as needed.   losartan (COZAAR) 25 MG tablet TAKE 1 TABLET BY MOUTH EVERY DAY   Omega-3 Fatty Acids (FISH OIL MAXIMUM STRENGTH) 1200 MG CAPS Take 1,200 mg by mouth 2 (two) times daily.    propranolol (INDERAL) 10 MG tablet TAKE 1 TABLET BY MOUTH TWICE A DAY   Propylene Glycol (SYSTANE BALANCE) 0.6 % SOLN Place 2 drops into both eyes 2 (two) times daily.   sodium chloride (OCEAN) 0.65 % nasal spray Place 1 spray into the nose daily.   tiZANidine (ZANAFLEX) 4 MG tablet Take 4 mg by mouth at bedtime.   [DISCONTINUED] Fluticasone-Umeclidin-Vilant (TRELEGY ELLIPTA) 100-62.5-25 MCG/ACT AEPB Inhale 1 Inhalation into the lungs daily. (Patient not taking: Reported on 05/28/2022)   No facility-administered encounter medications on file as of 08/12/2022.    Review of Systems:  Review of Systems  Constitutional:  Positive for chills. Negative for appetite change, fatigue and fever.  HENT:  Negative for congestion, hearing loss, rhinorrhea and sore throat.   Eyes: Negative.   Respiratory:  Negative for cough, shortness of breath and wheezing.   Cardiovascular:  Negative for chest pain, palpitations and leg swelling.  Gastrointestinal:  Positive for abdominal pain. Negative for abdominal distention, constipation, diarrhea, nausea and vomiting.  Genitourinary:  Negative for dysuria.       Abdominal pain after eating  Musculoskeletal:  Positive for back pain. Negative for arthralgias and myalgias.       Bilateral hip and lower back pain  Skin:  Negative for color change, rash and wound.  Neurological:  Negative for dizziness, weakness and headaches.  Psychiatric/Behavioral:  Negative for behavioral problems. The patient is not nervous/anxious.     Health Maintenance  Topic Date Due   COVID-19 Vaccine (6 - 2023-24 season) 04/05/2022   INFLUENZA VACCINE  12/12/2022   Medicare  Annual Wellness (AWV)  01/23/2023   DTaP/Tdap/Td (2 - Td or Tdap) 12/07/2023   Pneumonia Vaccine 56+ Years old  Completed   DEXA SCAN  Completed   Zoster Vaccines- Shingrix  Completed   HPV VACCINES  Aged Out   COLONOSCOPY (Pts 45-71yrs Insurance coverage will need to be confirmed)  Discontinued    Physical Exam: Vitals:   08/12/22 1021  BP: 129/80  Pulse: 66  Resp: 18  Temp: (!) 97.5 F (36.4 C)  SpO2: 99%  Weight: 182 lb 6 oz (82.7 kg)  Height: 5\' 1"  (1.549 m)   Body mass index is 34.46 kg/m. Physical Exam  Constitutional:      Appearance: She is obese.  HENT:     Head: Normocephalic and atraumatic.     Nose: Nose normal.     Mouth/Throat:     Mouth: Mucous membranes are moist.  Eyes:     Conjunctiva/sclera: Conjunctivae normal.  Cardiovascular:     Rate and Rhythm: Normal rate and regular rhythm.  Pulmonary:     Effort: Pulmonary effort is normal.     Breath sounds: Normal breath sounds.  Abdominal:     General: Bowel sounds are normal.     Palpations: Abdomen is soft.  Musculoskeletal:        General: Normal range of motion.     Cervical back: Normal range of motion.     Comments: Tender bilateral hips  Skin:    General: Skin is warm and dry.  Neurological:     General: No focal deficit present.     Mental Status: She is alert and oriented to person, place, and time.  Psychiatric:        Mood and Affect: Mood normal.        Behavior: Behavior normal.        Thought Content: Thought content normal.        Judgment: Judgment normal.     Labs reviewed: Basic Metabolic Panel: Recent Labs    01/02/22 1104 03/30/22 1211 04/02/22 0932  NA 140 137 139  K 4.2 4.2 4.0  CL 102 101 100  CO2 25 26 28   GLUCOSE 99 103* 100*  BUN 15 18 19   CREATININE 0.75 0.70 0.64  CALCIUM 9.8 9.5 9.9   Liver Function Tests: Recent Labs    08/13/21 0942 12/21/21 1149 04/02/22 0932  AST 22 24 22   ALT 15 17 17   BILITOT 0.8 0.6 0.8  PROT 6.7 6.6 7.1   No results  for input(s): "LIPASE", "AMYLASE" in the last 8760 hours. No results for input(s): "AMMONIA" in the last 8760 hours. CBC: Recent Labs    09/28/21 0845 12/21/21 1149 03/30/22 1211 04/02/22 0932  WBC 7.1 8.1 8.6 9.9  NEUTROABS 3.2 3,588  --  6,089  HGB 14.2 14.6 13.9 14.1  HCT 42.5 42.5 41.6 41.6  MCV 90.4 89.1 89.3 88.1  PLT 248 301 327 386   Lipid Panel: Recent Labs    11/16/21 0839  CHOL 212*  HDL 51  LDLCALC 134*  TRIG 143  CHOLHDL 4.2   Lab Results  Component Value Date   HGBA1C 5.5 11/06/2020    Procedures since last visit: No results found.  Assessment/Plan  1. Chronic right-sided low back pain without sciatica -  follows up with orthopedics - POC Urinalysis Dipstick -  has trace ketones, trace proteins and negative nitrates - Urine Culture -  continue Tizanidine for pain  2. Gastroesophageal reflux disease without esophagitis -  continue Nexium  3. Epigastric pain -  experiences pain after eating -  takes Nexium - Amylase - Complete Metabolic Panel with eGFR  4. Primary osteoarthritis of both knees -  continue knee braces    Labs/tests ordered:   amylase, CMP, urine POC  Next appt:  01/31/2023

## 2022-08-13 LAB — COMPLETE METABOLIC PANEL WITH GFR
AG Ratio: 1.8 (calc) (ref 1.0–2.5)
ALT: 15 U/L (ref 6–29)
AST: 22 U/L (ref 10–35)
Albumin: 4.3 g/dL (ref 3.6–5.1)
Alkaline phosphatase (APISO): 81 U/L (ref 37–153)
BUN: 20 mg/dL (ref 7–25)
CO2: 28 mmol/L (ref 20–32)
Calcium: 10 mg/dL (ref 8.6–10.4)
Chloride: 105 mmol/L (ref 98–110)
Creat: 0.63 mg/dL (ref 0.60–0.95)
Globulin: 2.4 g/dL (calc) (ref 1.9–3.7)
Glucose, Bld: 104 mg/dL — ABNORMAL HIGH (ref 65–99)
Potassium: 4.8 mmol/L (ref 3.5–5.3)
Sodium: 142 mmol/L (ref 135–146)
Total Bilirubin: 0.7 mg/dL (ref 0.2–1.2)
Total Protein: 6.7 g/dL (ref 6.1–8.1)
eGFR: 88 mL/min/{1.73_m2} (ref >=60)

## 2022-08-13 LAB — URINE CULTURE
MICRO NUMBER:: 14766901
SPECIMEN QUALITY:: ADEQUATE

## 2022-08-13 LAB — AMYLASE: Amylase: 133 U/L — ABNORMAL HIGH (ref 21–101)

## 2022-08-16 ENCOUNTER — Other Ambulatory Visit: Payer: Self-pay | Admitting: Adult Health

## 2022-08-16 ENCOUNTER — Telehealth: Payer: Self-pay

## 2022-08-16 DIAGNOSIS — R1013 Epigastric pain: Secondary | ICD-10-CM

## 2022-08-16 NOTE — Progress Notes (Signed)
Discussed result with patient and informed her about CT abdomen order.

## 2022-08-16 NOTE — Telephone Encounter (Signed)
Atient called concerned about lab reuslts. She hasn't heard anything. Please review lab results and send to [facility] clinical pool.  Message routed to Kenard Gower, NP

## 2022-08-19 ENCOUNTER — Other Ambulatory Visit: Payer: Self-pay | Admitting: Adult Health

## 2022-08-19 DIAGNOSIS — R1013 Epigastric pain: Secondary | ICD-10-CM

## 2022-09-06 ENCOUNTER — Encounter: Payer: Self-pay | Admitting: Nurse Practitioner

## 2022-09-06 ENCOUNTER — Ambulatory Visit (INDEPENDENT_AMBULATORY_CARE_PROVIDER_SITE_OTHER): Payer: PPO | Admitting: Nurse Practitioner

## 2022-09-06 VITALS — BP 132/78 | HR 75 | Ht 61.0 in | Wt 181.4 lb

## 2022-09-06 DIAGNOSIS — F331 Major depressive disorder, recurrent, moderate: Secondary | ICD-10-CM | POA: Insufficient documentation

## 2022-09-06 DIAGNOSIS — R1013 Epigastric pain: Secondary | ICD-10-CM

## 2022-09-06 DIAGNOSIS — M545 Low back pain, unspecified: Secondary | ICD-10-CM

## 2022-09-06 DIAGNOSIS — M170 Bilateral primary osteoarthritis of knee: Secondary | ICD-10-CM

## 2022-09-06 DIAGNOSIS — I5032 Chronic diastolic (congestive) heart failure: Secondary | ICD-10-CM

## 2022-09-06 DIAGNOSIS — J42 Unspecified chronic bronchitis: Secondary | ICD-10-CM

## 2022-09-06 DIAGNOSIS — I10 Essential (primary) hypertension: Secondary | ICD-10-CM

## 2022-09-06 DIAGNOSIS — I480 Paroxysmal atrial fibrillation: Secondary | ICD-10-CM

## 2022-09-06 DIAGNOSIS — I8500 Esophageal varices without bleeding: Secondary | ICD-10-CM

## 2022-09-06 DIAGNOSIS — G8929 Other chronic pain: Secondary | ICD-10-CM | POA: Diagnosis not present

## 2022-09-06 DIAGNOSIS — K219 Gastro-esophageal reflux disease without esophagitis: Secondary | ICD-10-CM | POA: Diagnosis not present

## 2022-09-06 NOTE — Progress Notes (Signed)
Careteam: Patient Care Team: Sharon Seller, NP as PCP - General (Nurse Practitioner) Kathleene Hazel, MD as PCP - Cardiology (Cardiology)  PLACE OF SERVICE:  [facility]  Advanced Directive information    Allergies  Allergen Reactions   Wixela Inhub [Fluticasone-Salmeterol] Shortness Of Breath   Ace Inhibitors     cough   Amoxicillin Itching    REACTION: unknown? pain in right kidney   Benicar Hct [Olmesartan Medoxomil-Hctz]     Extreme weakness   Budesonide-Formoterol Fumarate     Causes tremors and numbness   Chlorzoxazone Hives   Chlorzoxazone [Chlorzoxazone]    Citalopram Hydrobromide     REACTION: hives   Citalopram Hydrobromide    Clonidine Derivatives    Cymbalta [Duloxetine Hcl]    Droperidol     REACTION: hives   Flexeril [Cyclobenzaprine Hcl]    Fluconazole Nausea And Vomiting and Other (See Comments)    fatigue   Formoterol Itching   Gabapentin Itching   Ketorolac Tromethamine     REACTION: hives   Ketorolac Tromethamine    Metoclopramide Hcl    Mometasone Furo-Formoterol Fum     Causes sore throat, blurred vision and unable to sleep--started on med 09-17-10   Mometasone Furoate Itching   Morphine     REACTION: hives and itching   Olmesartan Medoxomil     REACTION: fatique   Breo Ellipta [Fluticasone Furoate-Vilanterol] Itching    Pt reports itching with Virgel Bouquet is related to being lactose intolerant.     Chief Complaint  Patient presents with   Medical Management of Chronic Issues    Patient presents today for a 8 month follow-up   Quality Metric Gaps    COVID#6     HPI: Patient is a 82 y.o. female for follow up.  She reports she needed to ask questions on multiple medical issues.  She has had a lot of pain in her back and knees- having injections on both- the pain has been so bad she is having a hard time doing anything else other than dealing with her pain.  Her son goes with her to appts. Reports her pain has improved over the  last few days- she was feeling very hopeless due to the pain.   She was seen due to pain in her stomach and reflux- amylase was elevated and CT abdomen was ordered- she was unsure why this was order. She cancelled CT scan. Pain in stomach and reflux pain has stopped. She is currently on nexium 20 mg daily   She was in the hospital and they dx her with COPD but after pulmonary workup she was diagnosised with chronic bronchitis- she is now on trelegy and breathing is well controlled.   Htn- controlled on norvasc, losartan and lasix and propranolol.   Had some severe depression   Review of Systems:  Review of Systems  Constitutional:  Negative for chills, fever and weight loss.  HENT:  Negative for tinnitus.   Respiratory:  Negative for cough, sputum production and shortness of breath.   Cardiovascular:  Negative for chest pain, palpitations and leg swelling.  Gastrointestinal:  Negative for abdominal pain, constipation, diarrhea and heartburn.  Genitourinary:  Negative for dysuria, frequency and urgency.  Musculoskeletal:  Positive for back pain and joint pain. Negative for falls and myalgias.  Skin: Negative.   Neurological:  Negative for dizziness and headaches.  Psychiatric/Behavioral:  Positive for depression. Negative for memory loss. The patient does not have insomnia.  Past Medical History:  Diagnosis Date   (HFpEF) heart failure with preserved ejection fraction (HCC) 12/26/2021   Echocardiogram 12/2021: EF 55-60, no RWMA, Gr 2 DD, low normal RVSF, mildly elevated PASP (RVSP 38.5), mild LAE, trivial MR   ALLERGIC RHINITIS    Anxiety    Asthma    Blood type O+    Cervical strain, acute    Cirrhosis (HCC)    Colon polyp 2010   adenoma   Diverticulosis of colon    Dizziness    Esophageal varices (HCC)    Fibromyalgia    GERD (gastroesophageal reflux disease)    History of nephrolithiasis    Hypercholesterolemia    borderline   Hypertension    IBS (irritable bowel  syndrome)    Osteoarthritis    Personal history of allergy to unspecified medicinal agent    Postconcussion syndrome    Vitamin D deficiency    Past Surgical History:  Procedure Laterality Date   CHOLECYSTECTOMY, LAPAROSCOPIC  2004   for gallstone pancreatitis   KNEE SURGERY Right    KNEE SURGERY Left    THYROID SURGERY     cyst removed   Social History:   reports that she quit smoking about 60 years ago. Her smoking use included cigarettes. She has a 16.00 pack-year smoking history. She has never used smokeless tobacco. She reports that she does not drink alcohol and does not use drugs.  Family History  Problem Relation Age of Onset   Breast cancer Mother    Alzheimer's disease Mother    Ovarian cancer Sister    Heart disease Sister    Breast cancer Sister 61   Alcohol abuse Sister    COPD Brother    Alcohol abuse Brother    Ovarian cancer Paternal Grandmother    Heart attack Maternal Uncle    COPD Cousin        Maternal side    Arthritis Other        Cousins    Colon cancer Neg Hx    Esophageal cancer Neg Hx     Medications: Patient's Medications  New Prescriptions   No medications on file  Previous Medications   ACETAMINOPHEN (TYLENOL) 500 MG TABLET    Take 1,000 mg by mouth 2 (two) times a day.   ALBUTEROL (PROAIR HFA) 108 (90 BASE) MCG/ACT INHALER    INHALE 2 PUFFS EVERY 4 HOURS AS NEEDED INTO THE LUNGS FOR WHEEZING OR SHORTNESS OF BREATH J45.20   AMLODIPINE (NORVASC) 5 MG TABLET    TAKE 1 TABLET (5 MG TOTAL) BY MOUTH DAILY   CETIRIZINE (ZYRTEC) 10 MG TABLET    Take 10 mg by mouth daily.   CHOLECALCIFEROL 50 MCG (2000 UT) CAPS    Take 2,000 Units by mouth in the morning and at bedtime.   ELIQUIS 2.5 MG TABS TABLET    TAKE 1 TABLET BY MOUTH TWICE A DAY   ESOMEPRAZOLE (NEXIUM) 20 MG CAPSULE    Take 1 capsule (20 mg total) by mouth daily.   EZETIMIBE (ZETIA) 10 MG TABLET    Take 1 tablet (10 mg total) by mouth daily.   FUROSEMIDE (LASIX) 20 MG TABLET    Take 1  tablet (20 mg total) by mouth daily.   LOSARTAN (COZAAR) 25 MG TABLET    TAKE 1 TABLET BY MOUTH EVERY DAY   OMEGA-3 FATTY ACIDS (FISH OIL MAXIMUM STRENGTH) 1200 MG CAPS    Take 1,200 mg by mouth 2 (two) times daily.  PROPRANOLOL (INDERAL) 10 MG TABLET    TAKE 1 TABLET BY MOUTH TWICE A DAY   PROPYLENE GLYCOL (SYSTANE BALANCE) 0.6 % SOLN    Place 2 drops into both eyes 2 (two) times daily.   SODIUM CHLORIDE (OCEAN) 0.65 % NASAL SPRAY    Place 1 spray into the nose daily.   TIZANIDINE (ZANAFLEX) 4 MG TABLET    Take 4 mg by mouth at bedtime.   TRELEGY ELLIPTA 100-62.5-25 MCG/ACT AEPB    Inhale 1 puff into the lungs daily.  Modified Medications   No medications on file  Discontinued Medications   No medications on file    Physical Exam:  Vitals:   09/06/22 1436  BP: 132/78  Pulse: 75  SpO2: 95%  Weight: 181 lb 6.4 oz (82.3 kg)  Height: 5\' 1"  (1.549 m)   Body mass index is 34.28 kg/m. Wt Readings from Last 3 Encounters:  09/06/22 181 lb 6.4 oz (82.3 kg)  08/12/22 182 lb 6 oz (82.7 kg)  05/28/22 175 lb 12.8 oz (79.7 kg)    Physical Exam Constitutional:      General: She is not in acute distress.    Appearance: She is well-developed. She is not diaphoretic.  HENT:     Head: Normocephalic and atraumatic.     Mouth/Throat:     Pharynx: No oropharyngeal exudate.  Eyes:     Conjunctiva/sclera: Conjunctivae normal.     Pupils: Pupils are equal, round, and reactive to light.  Cardiovascular:     Rate and Rhythm: Normal rate and regular rhythm.     Heart sounds: Normal heart sounds.  Pulmonary:     Effort: Pulmonary effort is normal.     Breath sounds: Normal breath sounds.  Abdominal:     General: Bowel sounds are normal.     Palpations: Abdomen is soft.  Musculoskeletal:     Cervical back: Normal range of motion and neck supple.     Right lower leg: No edema.     Left lower leg: No edema.  Skin:    General: Skin is warm and dry.  Neurological:     Mental Status: She is  alert.     Motor: Weakness present.     Gait: Gait abnormal.  Psychiatric:        Mood and Affect: Mood normal.     Labs reviewed: Basic Metabolic Panel: Recent Labs    03/30/22 1211 04/02/22 0932 08/12/22 1121  NA 137 139 142  K 4.2 4.0 4.8  CL 101 100 105  CO2 26 28 28   GLUCOSE 103* 100* 104*  BUN 18 19 20   CREATININE 0.70 0.64 0.63  CALCIUM 9.5 9.9 10.0   Liver Function Tests: Recent Labs    12/21/21 1149 04/02/22 0932 08/12/22 1121  AST 24 22 22   ALT 17 17 15   BILITOT 0.6 0.8 0.7  PROT 6.6 7.1 6.7   Recent Labs    08/12/22 1121  AMYLASE 133*   No results for input(s): "AMMONIA" in the last 8760 hours. CBC: Recent Labs    09/28/21 0845 12/21/21 1149 03/30/22 1211 04/02/22 0932  WBC 7.1 8.1 8.6 9.9  NEUTROABS 3.2 3,588  --  6,089  HGB 14.2 14.6 13.9 14.1  HCT 42.5 42.5 41.6 41.6  MCV 90.4 89.1 89.3 88.1  PLT 248 301 327 386   Lipid Panel: Recent Labs    11/16/21 0839  CHOL 212*  HDL 51  LDLCALC 134*  TRIG 143  CHOLHDL 4.2  TSH: No results for input(s): "TSH" in the last 8760 hours. A1C: Lab Results  Component Value Date   HGBA1C 5.5 11/06/2020     Assessment/Plan 1. Epigastric pain -has resolved at this time, will follow up labs to make sure they have normalized - Amylase - Complete Metabolic Panel with eGFR  2. Gastroesophageal reflux disease without esophagitis Well controlled on nexium 20 mg daily   3. Chronic right-sided low back pain without sciatica Had extreme pain that has now improved, even though she is in pain more manageable.  Continues with ortho  4. Primary osteoarthritis of both knees -stable with knee injections and braces. Continue current regimen.  5. Paroxysmal atrial fibrillation (HCC) -stable, continues on propranolol for rate control.  - CBC with Differential/Platelet  6. Chronic bronchitis, unspecified chronic bronchitis type (HCC) -stable. Continue on ellipta   7. Essential hypertension -Blood  pressure well controlled, goal bp <140/90 Continue current medications and dietary modifications follow metabolic panel  8. Chronic heart failure with preserved ejection fraction (HCC) Stable, continues on lasix  9. Moderate episode of recurrent major depressive disorder (HCC) -ongoing, relies a lot on her faith which has been good for her. Has a strong support in her church family   10. Esophageal varices without bleeding, unspecified esophageal varices type (HCC) Followed by GI, no signs of blood loss. Will follow up CBC   Return in about 4 months (around 01/06/2023) for routine follow up.:  [name] K. Biagio Borg [facility] [phone]

## 2022-09-07 LAB — COMPLETE METABOLIC PANEL WITH GFR
AG Ratio: 1.9 (calc) (ref 1.0–2.5)
ALT: 15 U/L (ref 6–29)
AST: 21 U/L (ref 10–35)
Albumin: 4.5 g/dL (ref 3.6–5.1)
Alkaline phosphatase (APISO): 79 U/L (ref 37–153)
BUN: 21 mg/dL (ref 7–25)
CO2: 29 mmol/L (ref 20–32)
Calcium: 9.9 mg/dL (ref 8.6–10.4)
Chloride: 100 mmol/L (ref 98–110)
Creat: 0.76 mg/dL (ref 0.60–0.95)
Globulin: 2.4 g/dL (calc) (ref 1.9–3.7)
Glucose, Bld: 101 mg/dL — ABNORMAL HIGH (ref 65–99)
Potassium: 3.9 mmol/L (ref 3.5–5.3)
Sodium: 139 mmol/L (ref 135–146)
Total Bilirubin: 1 mg/dL (ref 0.2–1.2)
Total Protein: 6.9 g/dL (ref 6.1–8.1)
eGFR: 78 mL/min/{1.73_m2} (ref >=60)

## 2022-09-07 LAB — AMYLASE: Amylase: 102 U/L — ABNORMAL HIGH (ref 21–101)

## 2022-09-07 LAB — CBC WITH DIFFERENTIAL/PLATELET
Absolute Monocytes: 809 cells/uL (ref 200–950)
Basophils Absolute: 47 cells/uL (ref 0–200)
Basophils Relative: 0.5 %
Eosinophils Absolute: 84 cells/uL (ref 15–500)
Eosinophils Relative: 0.9 %
HCT: 44.4 % (ref 35.0–45.0)
Hemoglobin: 14.8 g/dL (ref 11.7–15.5)
Lymphs Abs: 3943 cells/uL — ABNORMAL HIGH (ref 850–3900)
MCH: 29.7 pg (ref 27.0–33.0)
MCHC: 33.3 g/dL (ref 32.0–36.0)
MCV: 89 fL (ref 80.0–100.0)
MPV: 10.1 fL (ref 7.5–12.5)
Monocytes Relative: 8.7 %
Neutro Abs: 4418 cells/uL (ref 1500–7800)
Neutrophils Relative %: 47.5 %
Platelets: 334 10*3/uL (ref 140–400)
RBC: 4.99 10*6/uL (ref 3.80–5.10)
RDW: 13.1 % (ref 11.0–15.0)
Total Lymphocyte: 42.4 %
WBC: 9.3 10*3/uL (ref 3.8–10.8)

## 2022-09-16 ENCOUNTER — Ambulatory Visit: Payer: PPO | Admitting: Physician Assistant

## 2022-09-16 ENCOUNTER — Encounter: Payer: Self-pay | Admitting: Physician Assistant

## 2022-09-16 ENCOUNTER — Other Ambulatory Visit: Payer: PPO

## 2022-09-16 VITALS — BP 136/76 | HR 63 | Ht 61.0 in

## 2022-09-16 DIAGNOSIS — K7460 Unspecified cirrhosis of liver: Secondary | ICD-10-CM | POA: Diagnosis not present

## 2022-09-16 DIAGNOSIS — K7581 Nonalcoholic steatohepatitis (NASH): Secondary | ICD-10-CM

## 2022-09-16 DIAGNOSIS — I8510 Secondary esophageal varices without bleeding: Secondary | ICD-10-CM

## 2022-09-16 DIAGNOSIS — Z86010 Personal history of colonic polyps: Secondary | ICD-10-CM | POA: Diagnosis not present

## 2022-09-16 DIAGNOSIS — K219 Gastro-esophageal reflux disease without esophagitis: Secondary | ICD-10-CM

## 2022-09-16 DIAGNOSIS — K760 Fatty (change of) liver, not elsewhere classified: Secondary | ICD-10-CM

## 2022-09-16 NOTE — Progress Notes (Signed)
Agree with assessment / plan as outlined.  

## 2022-09-16 NOTE — Progress Notes (Signed)
Chief Complaint: Follow-up cirrhosis  HPI:    [name] is a an 82 year old female with a past medical history as listed below including heart failure with preserved ejection fraction, cirrhosis, fibromyalgia, esophageal varices, GERD, IBS and multiple others, known to Dr. Adela Lank, who presents to clinic today for follow-up of her cirrhosis.      02/14/2021 office visit with Dr. Adela Lank for follow-up for cirrhosis.  At that time was tolerating Propranolol.  Discussed history of C. difficile in the past while on a PPI, over time had reduced her dose of Nexium and she was on 20 mg daily.  At that time having chronic pain from her scoliosis.  Discussed she had compensated NASH cirrhosis with esophageal varices on Propranolol at low-dose.  Discussed she was due for [facility] screening.  Also AFP.  Discussed no further surveillance endoscopy given she was on Propranolol and no further colonoscopy.  Ordered right upper quadrant ultrasound, AFP level and continue Nexium and Propranolol.    09/06/2022 CBC and CMP normal.  Patient seen by PCP that day for epigastric pain.  At that time pain was better.    Today, the patient presents to clinic and tells me that she is doing well.  Over the past couple of months she had some increase in epigastric pain and was evaluated by her PCP, but this pain went away over a period of about a month.  Tells me that her reflux is still well-controlled with Nexium 20 mg daily and the occasional Tums.  She is very happy.  She continues on Propranolol 10 mg twice a day for varices.  No new GI complaints or concerns.    Also tells me that the Shaune Pollack took away her back pain about 14 days ago.  This is pain she had for years from scoliosis which was debilitating.  She is very thankful.    Very proud of all of her great nieces and nephews who are going into the medical world for their careers.    Denies fever, chills, weight loss, change in bowel habits or symptoms that awaken her from  sleep.  GI History:  Cirrhosis - compensated to date.  Recall she was diagnosed in 06/2017 when she had incidental finding of esophageal varices on EGD done to evaluate history of possible Barrett's. Suspected to be due to NASH given negative workup otherwise.  No decompensations over time.  She has been on low-dose propranolol due to a low resting heart rate in the 50s to 60s.  She has not had any problems with ascites, no evidence of hepatic encephalopathy or jaundice.  She is worked really hard at weight loss with diet, she started over 200 pounds and was now down to 139.  She states she has had a hard time maintaining that weight and has gained weight back up to 163 but continues to work on this.  She has been immunized hepatitis A.  Her last HCC screening was performed in 01/2020 with stable changes of her liver   EGD 06/25/2017 -  No evidence of Barrett's, diminutive white plaques in the esophagus c/w candidiasis, suspected varices in mid esophagus, multiple gastric polyps, normal stomach / duodenum - fundic gland polyps   Colonoscopy 06/25/2017 - 9 small adenomas, a few small-mouthed diverticula were found in the sigmoid colon and ascending colon, Internal hemorrhoids were found during retroflexion. The hemorrhoids were moderate.    Colonoscopy 07/28/2008 - diverticulosis, one small adenoma, biopsies taken to rule out microscopic colitis and negative  EGD 9/209/2006 - 2cm segment of possible BE - bx show reflux changes only, no BE     Echo 11/23/19 - EF 60-65%   RUQ [place] 01/2020 - IMPRESSION: 1. Heterogeneously increased echogenicity of the liver in keeping with geographic areas of fatty infiltration. No discrete hepatic masses are identified. 2. Postcholecystectomy.  Past Medical History:  Diagnosis Date   (HFpEF) heart failure with preserved ejection fraction (HCC) 12/26/2021   Echocardiogram 12/2021: EF 55-60, no RWMA, Gr 2 DD, low normal RVSF, mildly elevated PASP (RVSP 38.5), mild LAE,  trivial MR   ALLERGIC RHINITIS    Anxiety    Asthma    Blood type O+    Cervical strain, acute    Cirrhosis (HCC)    Colon polyp 2010   adenoma   Diverticulosis of colon    Dizziness    Esophageal varices (HCC)    Fibromyalgia    GERD (gastroesophageal reflux disease)    History of nephrolithiasis    Hypercholesterolemia    borderline   Hypertension    IBS (irritable bowel syndrome)    Osteoarthritis    Personal history of allergy to unspecified medicinal agent    Postconcussion syndrome    Vitamin D deficiency     Past Surgical History:  Procedure Laterality Date   CHOLECYSTECTOMY, LAPAROSCOPIC  2004   for gallstone pancreatitis   KNEE SURGERY Right    KNEE SURGERY Left    THYROID SURGERY     cyst removed    Current Outpatient Medications  Medication Sig Dispense Refill   acetaminophen (TYLENOL) 500 MG tablet Take 1,000 mg by mouth 2 (two) times a day.     albuterol (PROAIR HFA) 108 (90 Base) MCG/ACT inhaler INHALE 2 PUFFS EVERY 4 HOURS AS NEEDED INTO THE LUNGS FOR WHEEZING OR SHORTNESS OF BREATH J45.20 25.5 g 5   amLODipine (NORVASC) 5 MG tablet TAKE 1 TABLET (5 MG TOTAL) BY MOUTH DAILY 90 tablet 1   cetirizine (ZYRTEC) 10 MG tablet Take 10 mg by mouth daily.     Cholecalciferol 50 MCG (2000 UT) CAPS Take 2,000 Units by mouth in the morning and at bedtime.     ELIQUIS 2.5 MG TABS tablet TAKE 1 TABLET BY MOUTH TWICE A DAY 60 tablet 5   esomeprazole (NEXIUM) 20 MG capsule Take 1 capsule (20 mg total) by mouth daily. 30 capsule 5   ezetimibe (ZETIA) 10 MG tablet Take 1 tablet (10 mg total) by mouth daily. 90 tablet 3   furosemide (LASIX) 20 MG tablet Take 1 tablet (20 mg total) by mouth daily. 90 tablet 3   losartan (COZAAR) 25 MG tablet TAKE 1 TABLET BY MOUTH EVERY DAY 90 tablet 3   Omega-3 Fatty Acids (FISH OIL MAXIMUM STRENGTH) 1200 MG CAPS Take 1,200 mg by mouth 2 (two) times daily.      propranolol (INDERAL) 10 MG tablet TAKE 1 TABLET BY MOUTH TWICE A DAY 180  tablet 3   Propylene Glycol (SYSTANE BALANCE) 0.6 % SOLN Place 2 drops into both eyes 2 (two) times daily. 15 mL 0   sodium chloride (OCEAN) 0.65 % nasal spray Place 1 spray into the nose daily.     tiZANidine (ZANAFLEX) 4 MG tablet Take 4 mg by mouth at bedtime.     TRELEGY ELLIPTA 100-62.5-25 MCG/ACT AEPB Inhale 1 puff into the lungs daily.     No current facility-administered medications for this visit.    Allergies as of 09/16/2022 - Review Complete 09/06/2022  Allergen  Reaction Noted   Wixela inhub [fluticasone-salmeterol] Shortness Of Breath 03/21/2022   Ace inhibitors  08/25/2013   Amoxicillin Itching    Benicar hct [olmesartan medoxomil-hctz]  08/10/2010   Budesonide-formoterol fumarate  09/26/2010   Chlorzoxazone Hives 08/10/2010   Chlorzoxazone [chlorzoxazone]  08/10/2010   Citalopram hydrobromide     Citalopram hydrobromide  08/10/2010   Clonidine derivatives  08/10/2010   Cymbalta [duloxetine hcl]  11/04/2019   Droperidol     Flexeril [cyclobenzaprine hcl]  08/10/2010   Fluconazole Nausea And Vomiting and Other (See Comments) 07/16/2017   Formoterol Itching 06/29/2015   Gabapentin Itching 10/19/2019   Ketorolac tromethamine     Ketorolac tromethamine  08/10/2010   Metoclopramide hcl  08/10/2010   Mometasone furo-formoterol fum  09/26/2010   Mometasone furoate Itching 06/29/2015   Morphine     Olmesartan medoxomil  04/25/2009   Breo ellipta [fluticasone furoate-vilanterol] Itching 05/23/2016    Family History  Problem Relation Age of Onset   Breast cancer Mother    Alzheimer's disease Mother    Ovarian cancer Sister    Heart disease Sister    Breast cancer Sister 8   Alcohol abuse Sister    COPD Brother    Alcohol abuse Brother    Ovarian cancer Paternal Grandmother    Heart attack Maternal Uncle    COPD Cousin        Maternal side    Arthritis Other        Cousins    Colon cancer Neg Hx    Esophageal cancer Neg Hx     Social History    Socioeconomic History   Marital status: Divorced    Spouse name: Not on file   Number of children: 1   Years of education: Not on file   Highest education level: Not on file  Occupational History   Occupation: retired    Associate Professor: Interior and spatial designer [employer]    Comment: Comptroller  Tobacco Use   Smoking status: Former    Packs/day: 2.00    Years: 8.00    Additional pack years: 0.00    Total pack years: 16.00    Types: Cigarettes    Quit date: 05/13/1962    Years since quitting: 60.3   Smokeless tobacco: Never  Vaping Use   Vaping Use: Never used  Substance and Sexual Activity   Alcohol use: No   Drug use: No   Sexual activity: Not Currently    Birth control/protection: Post-menopausal  Other Topics Concern   Not on file  Social History Narrative   exercises 3x a week   drinks 1 cup caffeine every other day   Social Determinants of Health   Financial Resource Strain: Low Risk  (07/16/2017)   Overall Financial Resource Strain (CARDIA)    Difficulty of Paying Living Expenses: Not hard at all  Food Insecurity: No Food Insecurity (07/16/2017)   Hunger Vital Sign    Worried About Radiation protection practitioner of Food in the Last Year: Never true    Ran Out of Food in the Last Year: Never true  Transportation Needs: No Transportation Needs (07/16/2017)   PRAPARE - Administrator, Civil Service (Medical): No    Lack of Transportation (Non-Medical): No  Physical Activity: Inactive (07/16/2017)   Exercise Vital Sign    Days of Exercise per Week: 0 days    Minutes of Exercise per Session: 0 min  Stress: Stress Concern Present (07/16/2017)   [employer] of Occupational Health -  Occupational Stress Questionnaire    Feeling of Stress : Rather much  Social Connections: Somewhat Isolated (07/16/2017)   Social Connection and Isolation Panel [NHANES]    Frequency of Communication with Friends and Family: More than three times a week    Frequency of Social Gatherings with  Friends and Family: More than three times a week    Attends Religious Services: More than 4 times per year    Active Member of [employer] or Organizations: No    Attends Banker Meetings: Never    Marital Status: Divorced  Catering manager Violence: Not At Risk (07/16/2017)   Humiliation, Afraid, Rape, and Kick questionnaire    Fear of Current or Ex-Partner: No    Emotionally Abused: No    Physically Abused: No    Sexually Abused: No    Review of Systems:    Constitutional: No weight loss, fever or chills Cardiovascular: No chest pain Respiratory: No SOB  Gastrointestinal: See HPI and otherwise negative   Physical Exam:  Vital signs: BP 136/76   Pulse 63   Ht 5\' 1"  (1.549 m)   BMI 34.28 kg/m    Constitutional:   Pleasant Elderly Caucasian female appears to be in NAD, Well developed, Well nourished, alert and cooperative Respiratory: Respirations even and unlabored. Lungs clear to auscultation bilaterally.   No wheezes, crackles, or rhonchi.  Cardiovascular: Normal S1, S2. No MRG. Regular rate and rhythm. No peripheral edema, cyanosis or pallor.  Gastrointestinal:  Soft, nondistended, nontender. No rebound or guarding. Normal bowel sounds. No appreciable masses or hepatomegaly. Rectal:  Not performed.  Psychiatric: Oriented to person, place and time. Demonstrates good judgement and reason without abnormal affect or behaviors.  RELEVANT LABS AND IMAGING: CBC    Component Value Date/Time   WBC 9.3 09/06/2022 1534   RBC 4.99 09/06/2022 1534   HGB 14.8 09/06/2022 1534   HGB 14.5 03/30/2015 1032   HCT 44.4 09/06/2022 1534   HCT 42.0 03/30/2015 1032   PLT 334 09/06/2022 1534   PLT 385 (H) 03/30/2015 1032   MCV 89.0 09/06/2022 1534   MCV 84 03/30/2015 1032   MCH 29.7 09/06/2022 1534   MCHC 33.3 09/06/2022 1534   RDW 13.1 09/06/2022 1534   RDW 14.1 03/30/2015 1032   LYMPHSABS 3,943 (H) 09/06/2022 1534   LYMPHSABS 2.7 03/30/2015 1032   MONOABS 0.7 09/28/2021 0845    EOSABS 84 09/06/2022 1534   EOSABS 0.1 03/30/2015 1032   BASOSABS 47 09/06/2022 1534   BASOSABS 0.1 03/30/2015 1032    CMP     Component Value Date/Time   NA 139 09/06/2022 1534   NA 140 01/02/2022 1104   K 3.9 09/06/2022 1534   CL 100 09/06/2022 1534   CO2 29 09/06/2022 1534   GLUCOSE 101 (H) 09/06/2022 1534   BUN 21 09/06/2022 1534   BUN 15 01/02/2022 1104   CREATININE 0.76 09/06/2022 1534   CALCIUM 9.9 09/06/2022 1534   PROT 6.9 09/06/2022 1534   PROT 7.0 11/10/2015 0903   ALBUMIN 2.9 (L) 11/25/2019 0502   ALBUMIN 4.5 11/10/2015 0903   AST 21 09/06/2022 1534   ALT 15 09/06/2022 1534   ALKPHOS 52 11/25/2019 0502   BILITOT 1.0 09/06/2022 1534   BILITOT 0.6 11/10/2015 0903   GFRNONAA >60 03/30/2022 1211   GFRNONAA 81 11/06/2020 0943   GFRAA 94 11/06/2020 0943    Assessment: 1.  MASH cirrhosis with esophageal varices 2   GERD 3.  History of colon polyps  Plan:  1.  Patient well-controlled on Propranolol 10 mg twice daily for variceal prophylaxis.  Continue.  Patient told me she does not need refills of this. 2.  Again as per discussion with Dr. Adela Lank previously no further surveillance endoscopy given she is on Propranolol.  No further colonoscopy given advanced age and other medical problems. 3.  Will update HCC screening today with right upper quadrant ultrasound and AFP level 4.  Continue Nexium 20 mg daily 5.  Patient to follow in clinic in 6 months to a year with Dr. Adela Lank.  Carla Meeker, PA-C [facility] 09/16/2022, 10:47 AM  Cc: Sharon Seller, NP

## 2022-09-16 NOTE — Patient Instructions (Signed)
_______________________________________________________  If your blood pressure at your visit was 140/90 or greater, please contact your primary care physician to follow up on this.  _______________________________________________________  If you are age 84 or older, your body mass index should be between 23-30. Your Body mass index is 34.28 kg/m. If this is out of the aforementioned range listed, please consider follow up with your Primary Care Provider.  If you are age 92 or younger, your body mass index should be between 19-25. Your Body mass index is 34.28 kg/m. If this is out of the aformentioned range listed, please consider follow up with your Primary Care Provider.   ________________________________________________________  The [facility] providers would like to encourage you to use [facility] to communicate with providers for non-urgent requests or questions.  Due to long hold times on the telephone, sending your provider a message by [facility] may be a faster and more efficient way to get a response.  Please allow 48 business hours for a response.  Please remember that this is for non-urgent requests.  _______________________________________________________  Your provider has requested that you go to the basement level for lab work before leaving today. Press "B" on the elevator. The lab is located at the first door on the left as you exit the elevator.  You have been scheduled for an abdominal ultrasound at [facility] (1st floor of hospital) through the ED department and let them know you are there for an outpatient ultrasound on 09-21-2022 at 10am. Please arrive 30 minutes prior to your appointment for registration. Make certain not to have anything to eat or drink midnight prior to your appointment. Should you need to reschedule your appointment, please contact radiology at [phone]. This test typically takes about 30 minutes to perform.  Please follow up in 6 months with  Dr Adela Lank. Give [place] a call at [phone] to schedule an appointment.  It was a pleasure to see you today!  Thank you for trusting me with your gastrointestinal care!

## 2022-09-17 LAB — AFP TUMOR MARKER: AFP-Tumor Marker: 3.3 ng/mL

## 2022-09-18 ENCOUNTER — Other Ambulatory Visit: Payer: Self-pay | Admitting: Nurse Practitioner

## 2022-09-18 DIAGNOSIS — I10 Essential (primary) hypertension: Secondary | ICD-10-CM

## 2022-09-19 ENCOUNTER — Other Ambulatory Visit: Payer: PPO

## 2022-09-20 DIAGNOSIS — M25561 Pain in right knee: Secondary | ICD-10-CM | POA: Diagnosis not present

## 2022-09-20 DIAGNOSIS — M25562 Pain in left knee: Secondary | ICD-10-CM | POA: Diagnosis not present

## 2022-09-21 ENCOUNTER — Ambulatory Visit (HOSPITAL_COMMUNITY)
Admission: RE | Admit: 2022-09-21 | Discharge: 2022-09-21 | Disposition: A | Discharge status: Home / Self Care | Payer: PPO | Source: Ambulatory Visit | Attending: Physician Assistant | Admitting: Physician Assistant

## 2022-09-21 DIAGNOSIS — I8510 Secondary esophageal varices without bleeding: Secondary | ICD-10-CM | POA: Insufficient documentation

## 2022-09-21 DIAGNOSIS — K7460 Unspecified cirrhosis of liver: Secondary | ICD-10-CM | POA: Insufficient documentation

## 2022-09-21 DIAGNOSIS — K7581 Nonalcoholic steatohepatitis (NASH): Secondary | ICD-10-CM | POA: Insufficient documentation

## 2022-09-21 DIAGNOSIS — Z0389 Encounter for observation for other suspected diseases and conditions ruled out: Secondary | ICD-10-CM | POA: Diagnosis not present

## 2022-09-21 DIAGNOSIS — K760 Fatty (change of) liver, not elsewhere classified: Secondary | ICD-10-CM | POA: Insufficient documentation

## 2022-10-01 ENCOUNTER — Other Ambulatory Visit: Payer: Self-pay | Admitting: Cardiovascular Disease

## 2022-10-01 NOTE — Telephone Encounter (Signed)
Prescription refill request for Eliquis received. Indication: AF Last office visit: 04/03/22  Carla Coke NP Scr: 0.76 on 09/06/22 Age: 82 Weight: 78kg  Based on above findings Eliquis 5mg  twice daily would be the appropriate dose.  Pt is taking 2.5mg  twice daily.  Message sent to Pharmacist to evaluate dose change.

## 2022-10-02 NOTE — Telephone Encounter (Signed)
Called patient and notified of dose increase and new Rx was sent to pharmacy.

## 2022-10-02 NOTE — Telephone Encounter (Signed)
Agree dose should be increased to 5mg  BID dosing, thanks!

## 2022-10-09 ENCOUNTER — Telehealth: Payer: PPO

## 2022-10-09 NOTE — Telephone Encounter (Signed)
Patient called and states that her manager of her building is requesting the following items in a letter stating that PCP Janyth Contes, Janene Harvey, NP approves of patient having these items. She states that it will be calculated into her living expenses. Tylenol, Fish Oil, Systane Eye Drops, Band-Aids, and Knee High Stockings. Message routed to PCP Janyth Contes, Janene Harvey, NP

## 2022-10-10 NOTE — Telephone Encounter (Signed)
Message routed to PCP Janyth Contes, Janene Harvey, NP again.

## 2022-10-11 NOTE — Telephone Encounter (Signed)
Letter written and printed.

## 2022-10-11 NOTE — Telephone Encounter (Signed)
Patient called and notified. Patient letter mailed.

## 2022-10-17 DIAGNOSIS — C44329 Squamous cell carcinoma of skin of other parts of face: Secondary | ICD-10-CM | POA: Diagnosis not present

## 2022-10-17 DIAGNOSIS — Z85828 Personal history of other malignant neoplasm of skin: Secondary | ICD-10-CM | POA: Diagnosis not present

## 2022-10-17 DIAGNOSIS — C4441 Basal cell carcinoma of skin of scalp and neck: Secondary | ICD-10-CM | POA: Diagnosis not present

## 2022-10-17 DIAGNOSIS — L84 Corns and callosities: Secondary | ICD-10-CM | POA: Diagnosis not present

## 2022-10-17 DIAGNOSIS — D692 Other nonthrombocytopenic purpura: Secondary | ICD-10-CM | POA: Diagnosis not present

## 2022-10-17 DIAGNOSIS — L821 Other seborrheic keratosis: Secondary | ICD-10-CM | POA: Diagnosis not present

## 2022-10-17 DIAGNOSIS — D485 Neoplasm of uncertain behavior of skin: Secondary | ICD-10-CM | POA: Diagnosis not present

## 2022-10-30 ENCOUNTER — Telehealth: Payer: Self-pay

## 2022-10-30 ENCOUNTER — Other Ambulatory Visit: Payer: Self-pay | Admitting: Physician Assistant

## 2022-10-30 ENCOUNTER — Other Ambulatory Visit: Payer: Self-pay | Admitting: Nurse Practitioner

## 2022-10-30 DIAGNOSIS — K219 Gastro-esophageal reflux disease without esophagitis: Secondary | ICD-10-CM

## 2022-10-30 DIAGNOSIS — E782 Mixed hyperlipidemia: Secondary | ICD-10-CM

## 2022-10-30 MED ORDER — MECLIZINE HCL 12.5 MG PO TABS: 12.5000 mg | ORAL_TABLET | Freq: Three times a day (TID) | ORAL | 0 refills | Status: DC | PRN

## 2022-10-30 NOTE — Telephone Encounter (Signed)
Patient aware.

## 2022-10-30 NOTE — Telephone Encounter (Signed)
Patient called requesting a rx for meclizine (not on active medication list). Patient states she has taken this in the years past for dizziness.  Patient refused to schedule an appointment at this time.

## 2022-10-30 NOTE — Telephone Encounter (Signed)
Rx sent in

## 2022-11-07 ENCOUNTER — Other Ambulatory Visit: Payer: Self-pay | Admitting: Nurse Practitioner

## 2022-11-25 DIAGNOSIS — Z515 Encounter for palliative care: Secondary | ICD-10-CM | POA: Diagnosis not present

## 2022-11-25 DIAGNOSIS — I110 Hypertensive heart disease with heart failure: Secondary | ICD-10-CM | POA: Diagnosis not present

## 2022-11-25 DIAGNOSIS — Z6833 Body mass index (BMI) 33.0-33.9, adult: Secondary | ICD-10-CM | POA: Diagnosis not present

## 2022-11-25 DIAGNOSIS — I5032 Chronic diastolic (congestive) heart failure: Secondary | ICD-10-CM | POA: Diagnosis not present

## 2022-12-02 ENCOUNTER — Encounter: Payer: Self-pay | Admitting: Cardiovascular Disease

## 2022-12-02 ENCOUNTER — Ambulatory Visit: Payer: PPO | Attending: Cardiovascular Disease | Admitting: Cardiovascular Disease

## 2022-12-02 ENCOUNTER — Telehealth: Payer: Self-pay | Admitting: Cardiovascular Disease

## 2022-12-02 VITALS — BP 116/60 | HR 65 | Ht 61.0 in | Wt 179.6 lb

## 2022-12-02 DIAGNOSIS — I5032 Chronic diastolic (congestive) heart failure: Secondary | ICD-10-CM | POA: Diagnosis not present

## 2022-12-02 DIAGNOSIS — R0602 Shortness of breath: Secondary | ICD-10-CM | POA: Diagnosis not present

## 2022-12-02 DIAGNOSIS — I10 Essential (primary) hypertension: Secondary | ICD-10-CM

## 2022-12-02 DIAGNOSIS — R5383 Other fatigue: Secondary | ICD-10-CM

## 2022-12-02 DIAGNOSIS — I480 Paroxysmal atrial fibrillation: Secondary | ICD-10-CM | POA: Diagnosis not present

## 2022-12-02 NOTE — Telephone Encounter (Signed)
Pt c/o of Chest Pain: STAT if CP now or developed within 24 hours  1. Are you having CP right now? Yes   2. Are you experiencing any other symptoms (ex. SOB, nausea, vomiting, sweating)? SOB and fatigue   3. How long have you been experiencing CP? Since Saturday 07/20   4. Is your CP continuous or coming and going? Continuous   5. Have you taken Nitroglycerin? No   Patient called in to get appt for extreme fatigue where she states it is hard for her to get up and move with. She is also stating she has SOB with chest heaviness since 07/20.   ?

## 2022-12-02 NOTE — Telephone Encounter (Signed)
Pt has called to report that she feels she is not doing well... she says she lacks energy most days to get up and do much... she says she is so tired that she has to get motivated to get up for her ADL's and it has been worsening over the oast few weeks but she had a bad past weekend.   She says that her SOB wiuht very minimal exertion has worsened the past few days. She is having bilat lower extremity edema And some swelling in her wrists. She says sometimes when she gets up to exert herself she will have pressure in her chest.   She lives in a retirement community so she has support.   I feel she needs to be assessed soon and she will come in to her see primary cardio Dr Clifton James today at 1 pm.

## 2022-12-02 NOTE — Progress Notes (Signed)
Chief Complaint  Patient presents with   Follow-up    Chest pain   History of Present Illness: 82 yo female with history of chronic diastolic CHF, paroxysmal atrial fibrillation, anxiety, asthma, fibromyalgia, GERD, HTN, HLD, IBS and cirrhosis secondary to NASH with esophageal varices here today for cardiac follow up. I saw her as a new patient in September 2021 for management of atrial fibrillation. She was admitted to [facility] in July 2021 with fever and UTI and was found to have atrial fibrillation. She was also treated for C diff. She was started on Eliquis and her beta blocker was continued. Echo 11/23/19 with LVEF=60-65%. No significant valve disease. She was in sinus at her office visit here in September 2021 and in March 2022. She was seen in our office August 2023 with c/o chest pain and dyspnea. She was started on Lasix 20 mg daily. Echo in August 2023 with LVEF=55-60%. No valve disease. Her dyspnea has been felt to be related to her underlying lung disease. She was last seen in our office in November 2023 and was feeling well.   She is here today for unplanned follow up. She has been having fatigue. The patient denies any change in occasional chronic chest pain and dyspnea. No palpitations, lower extremity edema, orthopnea, PND, dizziness, near syncope or syncope.    Primary Care Physician: Sharon Seller, NP  Past Medical History:  Diagnosis Date   (HFpEF) heart failure with preserved ejection fraction (HCC) 12/26/2021   Echocardiogram 12/2021: EF 55-60, no RWMA, Gr 2 DD, low normal RVSF, mildly elevated PASP (RVSP 38.5), mild LAE, trivial MR   ALLERGIC RHINITIS    Anxiety    Asthma    Blood type O+    Cervical strain, acute    Cirrhosis (HCC)    Colon polyp 2010   adenoma   Diverticulosis of colon    Dizziness    Esophageal varices (HCC)    Fibromyalgia    GERD (gastroesophageal reflux disease)    History of nephrolithiasis    Hypercholesterolemia    borderline    Hypertension    IBS (irritable bowel syndrome)    Osteoarthritis    Personal history of allergy to unspecified medicinal agent    Postconcussion syndrome    Vitamin D deficiency     Past Surgical History:  Procedure Laterality Date   CHOLECYSTECTOMY, LAPAROSCOPIC  2004   for gallstone pancreatitis   KNEE SURGERY Right    KNEE SURGERY Left    THYROID SURGERY     cyst removed    Current Outpatient Medications  Medication Sig Dispense Refill   acetaminophen (TYLENOL) 500 MG tablet Take 1,000 mg by mouth 2 (two) times a day.     albuterol (PROAIR HFA) 108 (90 Base) MCG/ACT inhaler INHALE 2 PUFFS EVERY 4 HOURS AS NEEDED INTO THE LUNGS FOR WHEEZING OR SHORTNESS OF BREATH J45.20 25.5 g 5   amLODipine (NORVASC) 5 MG tablet TAKE 1 TABLET (5 MG TOTAL) BY MOUTH DAILY. 90 tablet 1   apixaban (ELIQUIS) 5 MG TABS tablet Take 1 tablet (5 mg total) by mouth 2 (two) times daily. 60 tablet 5   cetirizine (ZYRTEC) 10 MG tablet Take 10 mg by mouth daily.     Cholecalciferol 50 MCG (2000 UT) CAPS Take 2,000 Units by mouth in the morning and at bedtime.     esomeprazole (NEXIUM) 20 MG capsule TAKE 1 CAPSULE BY MOUTH EVERY DAY 90 capsule 1   ezetimibe (ZETIA) 10 MG tablet  TAKE 1 TABLET BY MOUTH EVERY DAY 90 tablet 1   furosemide (LASIX) 20 MG tablet TAKE 1 TABLET BY MOUTH EVERY DAY 90 tablet 1   losartan (COZAAR) 25 MG tablet TAKE 1 TABLET BY MOUTH EVERY DAY 90 tablet 3   meclizine (ANTIVERT) 12.5 MG tablet Take 1 tablet (12.5 mg total) by mouth 3 (three) times daily as needed for dizziness. 30 tablet 0   Omega-3 Fatty Acids (FISH OIL MAXIMUM STRENGTH) 1200 MG CAPS Take 1,200 mg by mouth 2 (two) times daily.      propranolol (INDERAL) 10 MG tablet TAKE 1 TABLET BY MOUTH TWICE A DAY 180 tablet 3   Propylene Glycol (SYSTANE BALANCE) 0.6 % SOLN Place 2 drops into both eyes 2 (two) times daily. 15 mL 0   sodium chloride (OCEAN) 0.65 % nasal spray Place 1 spray into the nose daily.     tiZANidine (ZANAFLEX) 4  MG tablet Take 4 mg by mouth at bedtime.     TRELEGY ELLIPTA 100-62.5-25 MCG/ACT AEPB Inhale 1 puff into the lungs daily.     No current facility-administered medications for this visit.    Allergies  Allergen Reactions   Wixela Inhub [Fluticasone-Salmeterol] Shortness Of Breath   Ace Inhibitors     cough   Amoxicillin Itching    REACTION: unknown? pain in right kidney   Benicar Hct [Olmesartan Medoxomil-Hctz]     Extreme weakness   Budesonide-Formoterol Fumarate     Causes tremors and numbness   Chlorzoxazone Hives   Chlorzoxazone [Chlorzoxazone]    Citalopram Hydrobromide     REACTION: hives   Citalopram Hydrobromide    Clonidine Derivatives    Cymbalta [Duloxetine Hcl]    Droperidol     REACTION: hives   Flexeril [Cyclobenzaprine Hcl]    Fluconazole Nausea And Vomiting and Other (See Comments)    fatigue   Formoterol Itching   Gabapentin Itching   Ketorolac Tromethamine     REACTION: hives   Ketorolac Tromethamine    Metoclopramide Hcl    Mometasone Furo-Formoterol Fum     Causes sore throat, blurred vision and unable to sleep--started on med 09-17-10   Mometasone Furoate Itching   Morphine     REACTION: hives and itching   Olmesartan Medoxomil     REACTION: fatique   Breo Ellipta [Fluticasone Furoate-Vilanterol] Itching    Pt reports itching with Virgel Bouquet is related to being lactose intolerant.     Social History   Socioeconomic History   Marital status: Divorced    Spouse name: Not on file   Number of children: 1   Years of education: Not on file   Highest education level: Not on file  Occupational History   Occupation: retired    Associate Professor: [employer]    Comment: Comptroller  Tobacco Use   Smoking status: Former    Current packs/day: 0.00    Average packs/day: 2.0 packs/day for 8.0 years (16.0 ttl pk-yrs)    Types: Cigarettes    Start date: 05/13/1954    Quit date: 05/13/1962    Years since quitting: 60.5   Smokeless  tobacco: Never  Vaping Use   Vaping status: Never Used  Substance and Sexual Activity   Alcohol use: No   Drug use: No   Sexual activity: Not Currently    Birth control/protection: Post-menopausal  Other Topics Concern   Not on file  Social History Narrative   exercises 3x a week   drinks 1 cup caffeine  every other day   Social Determinants of Health   Financial Resource Strain: Low Risk  (07/16/2017)   Overall Financial Resource Strain (CARDIA)    Difficulty of Paying Living Expenses: Not hard at all  Food Insecurity: No Food Insecurity (07/16/2017)   Hunger Vital Sign    Worried About Running Out of Food in the Last Year: Never true    Ran Out of Food in the Last Year: Never true  Transportation Needs: No Transportation Needs (07/16/2017)   PRAPARE - Administrator, Civil Service (Medical): No    Lack of Transportation (Non-Medical): No  Physical Activity: Inactive (07/16/2017)   Exercise Vital Sign    Days of Exercise per Week: 0 days    Minutes of Exercise per Session: 0 min  Stress: Stress Concern Present (07/16/2017)   [employer] of Occupational Health - Occupational Stress Questionnaire    Feeling of Stress : Rather much  Social Connections: Somewhat Isolated (07/16/2017)   Social Connection and Isolation Panel [NHANES]    Frequency of Communication with Friends and Family: More than three times a week    Frequency of Social Gatherings with Friends and Family: More than three times a week    Attends Religious Services: More than 4 times per year    Active Member of [employer] or Organizations: No    Attends Banker Meetings: Never    Marital Status: Divorced  Catering manager Violence: Not At Risk (07/16/2017)   Humiliation, Afraid, Rape, and Kick questionnaire    Fear of Current or Ex-Partner: No    Emotionally Abused: No    Physically Abused: No    Sexually Abused: No    Family History  Problem Relation Age of Onset   Breast cancer Mother     Alzheimer's disease Mother    Ovarian cancer Sister    Heart disease Sister    Breast cancer Sister 29   Alcohol abuse Sister    COPD Brother    Alcohol abuse Brother    Ovarian cancer Paternal Grandmother    Heart attack Maternal Uncle    COPD Cousin        Maternal side    Arthritis Other        Cousins    Colon cancer Neg Hx    Esophageal cancer Neg Hx     Review of Systems:  As stated in the HPI and otherwise negative.   BP 116/60   Pulse 65   Ht 5\' 1"  (1.549 m)   Wt 81.5 kg   SpO2 98%   BMI 33.94 kg/m   Physical Examination: General: Well developed, well nourished, NAD  HEENT: OP clear, mucus membranes moist  SKIN: warm, dry. No rashes. Neuro: No focal deficits  Musculoskeletal: Muscle strength 5/5 all ext  Psychiatric: Mood and affect normal  Neck: No JVD, no carotid bruits, no thyromegaly, no lymphadenopathy.  Lungs:Clear bilaterally, no wheezes, rhonci, crackles Cardiovascular: Regular rate and rhythm. No murmurs, gallops or rubs. Abdomen:Soft. Bowel sounds present. Non-tender.  Extremities: No lower extremity edema. Pulses are 2 + in the bilateral DP/PT.  EKG:  EKG is  ordered today. The ekg ordered today demonstrates  EKG Interpretation Date/Time:  Monday December 02 2022 13:09:14 EDT Ventricular Rate:  65 PR Interval:  180 QRS Duration:  88 QT Interval:  382 QTC Calculation: 397 R Axis:   72  Text Interpretation: Normal sinus rhythm Normal ECG When compared with ECG of 30-Mar-2022 11:48, No significant change was  found Confirmed by Verne Carrow [phone]) on 12/02/2022 1:16:13 PM    Echo August 2023:  1. Left ventricular ejection fraction, by estimation, is 55 to 60%. The  left ventricle has normal function. The left ventricle has no regional  wall motion abnormalities. Left ventricular diastolic parameters are  consistent with Grade II diastolic  dysfunction (pseudonormalization).   2. Right ventricular systolic function is low normal. The right   ventricular size is normal. There is mildly elevated pulmonary artery  systolic pressure. The estimated right ventricular systolic pressure is  38.5 mmHg.   3. Left atrial size was mildly dilated.   4. The mitral valve is normal in structure. Trivial mitral valve  regurgitation. No evidence of mitral stenosis.   5. The aortic valve is tricuspid. There is mild calcification of the  aortic valve. Aortic valve regurgitation is not visualized. No aortic  stenosis is present.   6. The inferior vena cava is normal in size with greater than 50%  respiratory variability, suggesting right atrial pressure of 3 mmHg.   Recent Labs: 01/02/2022: NT-Pro BNP 97 03/30/2022: B Natriuretic Peptide 44.1 09/06/2022: ALT 15; BUN 21; Creat 0.76; Hemoglobin 14.8; Platelets 334; Potassium 3.9; Sodium 139   Lipid Panel    Component Value Date/Time   CHOL 212 (H) 11/16/2021 0839   CHOL 217 (H) 11/10/2015 0903   TRIG 143 11/16/2021 0839   HDL 51 11/16/2021 0839   HDL 44 11/10/2015 0903   CHOLHDL 4.2 11/16/2021 0839   VLDL 27 12/31/2016 0851   LDLCALC 134 (H) 11/16/2021 0839   LDLDIRECT 102.5 07/02/2012 1100     Wt Readings from Last 3 Encounters:  12/02/22 81.5 kg  09/06/22 82.3 kg  08/12/22 82.7 kg    Assessment and Plan:   1. Atrial fibrillation, paroxysmal: She is in sinus today. She has not had palpitations. CHADS VASC score of 4. Continue beta blocker and Eliquis.   2. Chronic diastolic CHF: LVEF normal by echo in 2023. She is not volume overloaded. I suspect that her dyspnea is due to her lung disease. Continue Lasix.   3. HTN: BP is well controlled. Continue Norvasc, Losartan and Inderal.   4. Fatigue: She has had seasonal allergies. I do not think her fatigue is cardiac related.   Labs/ tests ordered today include:   Orders Placed This Encounter  Procedures   EKG 12-Lead   Disposition:   F/U with me in 12 months.   Signed, Verne Carrow, MD 12/02/2022 2:33 PM    [facility]  [facility] [address] [place], [place], [place]  [address] Phone: [phone]; Fax: [phone]

## 2022-12-02 NOTE — Patient Instructions (Signed)
Medication Instructions:  No changes *If you need a refill on your cardiac medications before your next appointment, please call your pharmacy*   Lab Work: none If you have labs (blood work) drawn today and your tests are completely normal, you will receive your results only by: MyChart Message (if you have MyChart) OR A paper copy in the mail If you have any lab test that is abnormal or we need to change your treatment, we will call you to review the results.   Testing/Procedures: none   Follow-Up: As planned   

## 2022-12-11 ENCOUNTER — Telehealth: Payer: Self-pay

## 2022-12-11 NOTE — Telephone Encounter (Addendum)
Pateint called stating she is unable to come in or do a video visit  and would like a rx fro prednisone. Patient c.o SOB, mild headache, cough and throat pain associated with cough.  I stressed that patient would need to be seen and patient emphasized again that she is unable to come in and does not have the ability to do a video visit.

## 2022-12-11 NOTE — Telephone Encounter (Signed)
Patient is aware of Dinah's response and stated she will call her cardiologist to see if they will send in a rx without a visit

## 2022-12-11 NOTE — Telephone Encounter (Signed)
No reply from PCP, I am sending to an in office provider as the patient is expecting a return call today

## 2022-12-11 NOTE — Telephone Encounter (Signed)
If unable to come in recommend urgent care or ED for evaluation prior to prescribing medication.

## 2022-12-12 NOTE — Telephone Encounter (Signed)
Agree with dinah, pt needs a visit.

## 2022-12-17 ENCOUNTER — Inpatient Hospital Stay: Admission: RE | Admit: 2022-12-17 | Payer: PPO | Source: Ambulatory Visit

## 2022-12-18 ENCOUNTER — Telehealth: Payer: Self-pay | Admitting: Adult Health

## 2022-12-19 MED ORDER — TRELEGY ELLIPTA 100-62.5-25 MCG/ACT IN AEPB: 1.0000 | INHALATION_SPRAY | Freq: Every day | RESPIRATORY_TRACT | 3 refills | Status: DC

## 2022-12-19 NOTE — Telephone Encounter (Signed)
Called and spoke with patient. Patient requested a Trelegy refill be sent to [facility].  Patient is former Dr. Kendrick Fries patient to follow up with a new provider 05/2023.  Trelegy refill sent to requested pharmacy.  Nothing further at this time.

## 2022-12-30 ENCOUNTER — Other Ambulatory Visit: Payer: Self-pay | Admitting: Nurse Practitioner

## 2022-12-30 DIAGNOSIS — Z1231 Encounter for screening mammogram for malignant neoplasm of breast: Secondary | ICD-10-CM

## 2022-12-31 ENCOUNTER — Other Ambulatory Visit: Payer: Self-pay | Admitting: Nurse Practitioner

## 2022-12-31 DIAGNOSIS — R809 Proteinuria, unspecified: Secondary | ICD-10-CM

## 2022-12-31 DIAGNOSIS — R7303 Prediabetes: Secondary | ICD-10-CM

## 2022-12-31 DIAGNOSIS — I10 Essential (primary) hypertension: Secondary | ICD-10-CM

## 2022-12-31 NOTE — Telephone Encounter (Signed)
Pharmacy requested Pended Rx and sent to [facility] for approval due to HIGH ALERT Warning.

## 2023-01-01 DIAGNOSIS — M25561 Pain in right knee: Secondary | ICD-10-CM | POA: Diagnosis not present

## 2023-01-01 DIAGNOSIS — M25562 Pain in left knee: Secondary | ICD-10-CM | POA: Diagnosis not present

## 2023-01-10 ENCOUNTER — Ambulatory Visit: Payer: PPO | Admitting: Nurse Practitioner

## 2023-01-31 ENCOUNTER — Encounter: Payer: Self-pay | Admitting: Nurse Practitioner

## 2023-01-31 ENCOUNTER — Ambulatory Visit (INDEPENDENT_AMBULATORY_CARE_PROVIDER_SITE_OTHER): Payer: PPO | Admitting: Nurse Practitioner

## 2023-01-31 VITALS — BP 136/84 | HR 74 | Temp 97.3°F | Ht 63.08 in | Wt 185.0 lb

## 2023-01-31 DIAGNOSIS — I10 Essential (primary) hypertension: Secondary | ICD-10-CM | POA: Diagnosis not present

## 2023-01-31 DIAGNOSIS — Z23 Encounter for immunization: Secondary | ICD-10-CM

## 2023-01-31 DIAGNOSIS — R739 Hyperglycemia, unspecified: Secondary | ICD-10-CM

## 2023-01-31 DIAGNOSIS — I480 Paroxysmal atrial fibrillation: Secondary | ICD-10-CM

## 2023-01-31 DIAGNOSIS — E782 Mixed hyperlipidemia: Secondary | ICD-10-CM | POA: Diagnosis not present

## 2023-01-31 DIAGNOSIS — I8500 Esophageal varices without bleeding: Secondary | ICD-10-CM | POA: Diagnosis not present

## 2023-01-31 DIAGNOSIS — E2839 Other primary ovarian failure: Secondary | ICD-10-CM | POA: Diagnosis not present

## 2023-01-31 DIAGNOSIS — R413 Other amnesia: Secondary | ICD-10-CM | POA: Diagnosis not present

## 2023-01-31 DIAGNOSIS — Z0000 Encounter for general adult medical examination without abnormal findings: Secondary | ICD-10-CM | POA: Diagnosis not present

## 2023-01-31 DIAGNOSIS — Z66 Do not resuscitate: Secondary | ICD-10-CM | POA: Diagnosis not present

## 2023-01-31 NOTE — Progress Notes (Signed)
Subjective:   [name] is a 82 y.o. female who presents for Medicare Annual (Subsequent) preventive examination.  Visit Complete: In person    Cardiac Risk Factors include: advanced age (>53men, >82 women);dyslipidemia;hypertension;sedentary lifestyle;obesity (BMI >30kg/m2)     Objective:    Today's Vitals   01/31/23 0951  BP: 136/84  Pulse: 74  Temp: (!) 97.3 F (36.3 C)  TempSrc: Temporal  SpO2: 96%  Weight: 185 lb (83.9 kg)  Height: 5' 3.08" (1.602 m)  PainSc: 8   PainLoc: Knee   Body mass index is 32.69 kg/m.     01/30/2023    4:41 PM 04/08/2022    9:31 AM 01/22/2022    9:51 AM 09/28/2021    8:42 AM 08/13/2021    9:13 AM 05/11/2021    9:25 AM 02/08/2021   11:34 AM  Advanced Directives  Does Patient Have a Medical Advance Directive? Yes Yes Yes No Yes Yes Yes  Type of Advance Directive Out of facility DNR (pink MOST or yellow form) Out of facility DNR (pink MOST or yellow form) Out of facility DNR (pink MOST or yellow form)  Out of facility DNR (pink MOST or yellow form) Out of facility DNR (pink MOST or yellow form) Out of facility DNR (pink MOST or yellow form)  Does patient want to make changes to medical advance directive? No - Patient declined No - Patient declined No - Patient declined  No - Patient declined No - Patient declined No - Patient declined  Pre-existing out of facility DNR order (yellow form or pink MOST form) Yellow form placed in chart (order not valid for inpatient use);Pink MOST form placed in chart (order not valid for inpatient use)  Pink MOST form placed in chart (order not valid for inpatient use);Yellow form placed in chart (order not valid for inpatient use)  Yellow form placed in chart (order not valid for inpatient use);Pink MOST form placed in chart (order not valid for inpatient use) Yellow form placed in chart (order not valid for inpatient use);Pink MOST form placed in chart (order not valid for inpatient use) Yellow form placed in  chart (order not valid for inpatient use);Pink MOST form placed in chart (order not valid for inpatient use)    Current Medications (verified) Outpatient Encounter Medications as of 01/31/2023  Medication Sig   acetaminophen (TYLENOL) 500 MG tablet Take 1,000 mg by mouth as needed.   albuterol (PROAIR HFA) 108 (90 Base) MCG/ACT inhaler INHALE 2 PUFFS EVERY 4 HOURS AS NEEDED INTO THE LUNGS FOR WHEEZING OR SHORTNESS OF BREATH J45.20   amLODipine (NORVASC) 5 MG tablet TAKE 1 TABLET (5 MG TOTAL) BY MOUTH DAILY.   apixaban (ELIQUIS) 5 MG TABS tablet Take 1 tablet (5 mg total) by mouth 2 (two) times daily.   cetirizine (ZYRTEC) 10 MG tablet Take 10 mg by mouth daily.   Cholecalciferol 50 MCG (2000 UT) CAPS Take 2,000 Units by mouth in the morning and at bedtime.   esomeprazole (NEXIUM) 20 MG capsule TAKE 1 CAPSULE BY MOUTH EVERY DAY   ezetimibe (ZETIA) 10 MG tablet TAKE 1 TABLET BY MOUTH EVERY DAY   furosemide (LASIX) 20 MG tablet TAKE 1 TABLET BY MOUTH EVERY DAY   losartan (COZAAR) 25 MG tablet TAKE 1 TABLET BY MOUTH EVERY DAY   meclizine (ANTIVERT) 12.5 MG tablet Take 1 tablet (12.5 mg total) by mouth 3 (three) times daily as needed for dizziness.   Omega-3 Fatty Acids (FISH OIL MAXIMUM STRENGTH) 1200 MG  CAPS Take 1,200 mg by mouth 2 (two) times daily.    propranolol (INDERAL) 10 MG tablet TAKE 1 TABLET BY MOUTH TWICE A DAY   Propylene Glycol (SYSTANE BALANCE) 0.6 % SOLN Place 2 drops into both eyes 2 (two) times daily.   sodium chloride (OCEAN) 0.65 % nasal spray Place 1 spray into the nose daily.   tiZANidine (ZANAFLEX) 4 MG tablet Take 4 mg by mouth at bedtime.   TRELEGY ELLIPTA 100-62.5-25 MCG/ACT AEPB Inhale 1 puff into the lungs daily.   No facility-administered encounter medications on file as of 01/31/2023.    Allergies (verified) Wixela inhub [fluticasone-salmeterol], Ace inhibitors, Amoxicillin, Benicar hct [olmesartan medoxomil-hctz], Budesonide-formoterol fumarate, Chlorzoxazone,  Chlorzoxazone [chlorzoxazone], Citalopram hydrobromide, Citalopram hydrobromide, Clonidine derivatives, Cymbalta [duloxetine hcl], Droperidol, Flexeril [cyclobenzaprine hcl], Fluconazole, Formoterol, Gabapentin, Ketorolac tromethamine, Ketorolac tromethamine, Metoclopramide hcl, Mometasone furo-formoterol fum, Mometasone furoate, Morphine, Olmesartan medoxomil, and Breo ellipta [fluticasone furoate-vilanterol]   History: Past Medical History:  Diagnosis Date   (HFpEF) heart failure with preserved ejection fraction (HCC) 12/26/2021   Echocardiogram 12/2021: EF 55-60, no RWMA, Gr 2 DD, low normal RVSF, mildly elevated PASP (RVSP 38.5), mild LAE, trivial MR   ALLERGIC RHINITIS    Anxiety    Asthma    Blood type O+    Cervical strain, acute    Cirrhosis (HCC)    Colon polyp 2010   adenoma   Diverticulosis of colon    Dizziness    Esophageal varices (HCC)    Fibromyalgia    GERD (gastroesophageal reflux disease)    History of nephrolithiasis    Hypercholesterolemia    borderline   Hypertension    IBS (irritable bowel syndrome)    Osteoarthritis    Personal history of allergy to unspecified medicinal agent    Postconcussion syndrome    Vitamin D deficiency    Past Surgical History:  Procedure Laterality Date   CHOLECYSTECTOMY, LAPAROSCOPIC  2004   for gallstone pancreatitis   KNEE SURGERY Right    KNEE SURGERY Left    THYROID SURGERY     cyst removed   Family History  Problem Relation Age of Onset   Breast cancer Mother    Alzheimer's disease Mother    Ovarian cancer Sister    Heart disease Sister    Breast cancer Sister 50   Alcohol abuse Sister    COPD Brother    Alcohol abuse Brother    Ovarian cancer Paternal Grandmother    Heart attack Maternal Uncle    COPD Cousin        Maternal side    Arthritis Other        Cousins    Colon cancer Neg Hx    Esophageal cancer Neg Hx    Social History   Socioeconomic History   Marital status: Divorced    Spouse name: Not  on file   Number of children: 1   Years of education: Not on file   Highest education level: Not on file  Occupational History   Occupation: retired    Associate Professor: Interior and spatial designer [employer]    Comment: Comptroller  Tobacco Use   Smoking status: Former    Current packs/day: 0.00    Average packs/day: 2.0 packs/day for 8.0 years (16.0 ttl pk-yrs)    Types: Cigarettes    Start date: 05/13/1954    Quit date: 05/13/1962    Years since quitting: 60.7   Smokeless tobacco: Never  Vaping Use   Vaping status: Never Used  Substance and Sexual Activity   Alcohol use: No   Drug use: No   Sexual activity: Not Currently    Birth control/protection: Post-menopausal  Other Topics Concern   Not on file  Social History Narrative   exercises 3x a week   drinks 1 cup caffeine every other day   Social Determinants of Health   Financial Resource Strain: Low Risk  (07/16/2017)   Overall Financial Resource Strain (CARDIA)    Difficulty of Paying Living Expenses: Not hard at all  Food Insecurity: No Food Insecurity (07/16/2017)   Hunger Vital Sign    Worried About Running Out of Food in the Last Year: Never true    Ran Out of Food in the Last Year: Never true  Transportation Needs: No Transportation Needs (07/16/2017)   PRAPARE - Administrator, Civil Service (Medical): No    Lack of Transportation (Non-Medical): No  Physical Activity: Inactive (07/16/2017)   Exercise Vital Sign    Days of Exercise per Week: 0 days    Minutes of Exercise per Session: 0 min  Stress: Stress Concern Present (07/16/2017)   [employer] of Occupational Health - Occupational Stress Questionnaire    Feeling of Stress : Rather much  Social Connections: Somewhat Isolated (07/16/2017)   Social Connection and Isolation Panel [NHANES]    Frequency of Communication with Friends and Family: More than three times a week    Frequency of Social Gatherings with Friends and Family: More than three times a  week    Attends Religious Services: More than 4 times per year    Active Member of [employer] or Organizations: No    Attends Engineer, structural: Never    Marital Status: Divorced    Tobacco Counseling Counseling given: Not Answered   Clinical Intake:  Pre-visit preparation completed: Yes  Pain : No/denies pain Pain Score: 8      BMI - recorded: 32 Nutritional Status: BMI > 30  Obese Diabetes: No  How often do you need to have someone help you when you read instructions, pamphlets, or other written materials from your doctor or pharmacy?: 1 - Never         Activities of Daily Living    01/31/2023   10:09 AM  In your present state of health, do you have any difficulty performing the following activities:  Hearing? 1  Vision? 0  Difficulty concentrating or making decisions? 1  Walking or climbing stairs? 1  Comment pain in knees  Dressing or bathing? 0  Doing errands, shopping? 0  Preparing Food and eating ? N  Using the Toilet? N  In the past six months, have you accidently leaked urine? Y  Do you have problems with loss of bowel control? N  Managing your Medications? N  Managing your Finances? N  Housekeeping or managing your Housekeeping? N    Patient Care Team: Sharon Seller, NP as PCP - General (Nurse Practitioner) Kathleene Hazel, MD as PCP - Cardiology (Cardiology)  Indicate any recent Medical Services you may have received from other than [facility] providers in the past year (date may be approximate).     Assessment:   This is a routine wellness examination for Dayle.  Hearing/Vision screen Hearing Screening - Comments:: Patient admits to hearing loss and would like to see an audiologist  Vision Screening - Comments:: Last eye exam less than 12 months ago with Dr.Shapiro    Goals Addressed  This Visit's Progress    Patient Stated       To enjoy time she has left.        Depression Screen    01/31/2023     9:45 AM 08/12/2022   10:26 AM 04/02/2022    8:39 AM 01/22/2022    9:49 AM 11/16/2021    8:21 AM 08/13/2021    9:20 AM 05/11/2021    9:35 AM  PHQ 2/9 Scores  PHQ - 2 Score 0 0 0 0 0 0 0    Fall Risk    01/31/2023    9:44 AM 09/06/2022    2:43 PM 08/12/2022   10:28 AM 04/08/2022    9:31 AM 04/02/2022    8:39 AM  Fall Risk   Falls in the past year? 1 0 0 0 0  Number falls in past yr: 0 0 0 0   Comment Fell on cement      Injury with Fall? 0 0 0 0   Risk for fall due to : No Fall Risks No Fall Risks No Fall Risks No Fall Risks   Follow up Falls evaluation completed Falls evaluation completed Falls evaluation completed Falls evaluation completed     MEDICARE RISK AT HOME:    TIMED UP AND GO:  Was the test performed?  No    Cognitive Function:    01/31/2023    9:59 AM 07/16/2017   12:56 PM 07/02/2016    9:19 AM 04/04/2015   11:16 AM 09/22/2013    2:10 PM  MMSE - Mini Mental State Exam  Not completed:    --   Orientation to time 5 5 5 5 5   Orientation to Place 5 5 4 5 5   Registration 3 3 3 3 3   Attention/ Calculation 5 5 5 5 5   Recall 2 2 3 2 2   Language- name 2 objects 2 2 2 2 2   Language- repeat 1 1 1 1 1   Language- follow 3 step command 3 3 3 3 3   Language- read & follow direction 1 1 1 1 1   Write a sentence 1 1 1 1 1   Copy design 1 1 1 1 1   Total score 29 29 29 29 29         01/22/2022    9:51 AM 01/16/2021    9:39 AM 10/01/2019    8:44 AM 09/24/2018    8:46 AM  6CIT Screen  What Year? 0 points 0 points 0 points   What month? 0 points 0 points 0 points   What time? 0 points 3 points 0 points 0 points  Count back from 20 0 points 2 points 0 points 0 points  Months in reverse 0 points 0 points 0 points 2 points  Repeat phrase 2 points 8 points 0 points 0 points  Total Score 2 points 13 points 0 points     Immunizations Immunization History  Administered Date(s) Administered   Fluad Quad(high Dose 65+) 02/27/2021, 02/04/2022   Fluad Trivalent(High Dose 65+)  01/31/2023   Hepatitis A, Adult 10/15/2017, 04/17/2018   Influenza Split 02/11/2011, 02/25/2012   Influenza Whole 02/10/2009, 02/26/2010   Influenza, High Dose Seasonal PF 04/24/2017, 01/26/2018, 01/26/2018, 01/03/2019, 02/11/2020   Influenza,inj,Quad PF,6+ Mos 02/09/2014, 03/20/2015, 02/27/2016   Influenza-Unspecified 03/13/2013, 03/13/2017   PFIZER Comirnaty(Gray Top)Covid-19 Tri-Sucrose Vaccine 10/17/2020, 02/08/2022   PFIZER(Purple Top)SARS-COV-2 Vaccination 06/03/2019, 06/22/2019, 02/09/2020   Pneumococcal Conjugate-13 02/27/2016   Pneumococcal Polysaccharide-23 08/25/2014   Respiratory Syncytial Virus Vaccine,Recomb Aduvanted(Arexvy) 06/03/2022  Tdap 12/06/2013   Zoster Recombinant(Shingrix) 01/13/2019, 08/05/2019    TDAP status: Up to date  Flu Vaccine status: Up to date  Pneumococcal vaccine status: Up to date  Covid-19 vaccine status: Information provided on how to obtain vaccines.   Qualifies for Shingles Vaccine? Yes   Zostavax completed No   Shingrix Completed?: Yes  Screening Tests Health Maintenance  Topic Date Due   COVID-19 Vaccine (6 - 2023-24 season) 01/12/2023   DTaP/Tdap/Td (2 - Td or Tdap) 12/07/2023   Medicare Annual Wellness (AWV)  01/31/2024   Pneumonia Vaccine 44+ Years old  Completed   INFLUENZA VACCINE  Completed   DEXA SCAN  Completed   Zoster Vaccines- Shingrix  Completed   HPV VACCINES  Aged Out   Colonoscopy  Discontinued    Health Maintenance  Health Maintenance Due  Topic Date Due   COVID-19 Vaccine (6 - 2023-24 season) 01/12/2023    Colorectal cancer screening: No longer required.   Mammogram status: No longer required due to age.  Bone Density status: Ordered today. Pt provided with contact info and advised to call to schedule appt.  Lung Cancer Screening: (Low Dose CT Chest recommended if Age 37-80 years, 20 pack-year currently smoking OR have quit w/in 15years.) does not qualify.   Lung Cancer Screening Referral:  na  Additional Screening:  Hepatitis C Screening: does not qualify  Vision Screening: Recommended annual ophthalmology exams for early detection of glaucoma and other disorders of the eye. Is the patient up to date with their annual eye exam?  Yes  Who is the provider or what is the name of the office in which the patient attends annual eye exams? [facility]  If pt is not established with a provider, would they like to be referred to a provider to establish care? No .   Dental Screening: Recommended annual dental exams for proper oral hygiene    Community Resource Referral / Chronic Care Management: CRR required this visit?  No   CCM required this visit?  No     Plan:     I have personally reviewed and noted the following in the patient's chart:   Medical and social history Use of alcohol, tobacco or illicit drugs  Current medications and supplements including opioid prescriptions. Patient is not currently taking opioid prescriptions. Functional ability and status Nutritional status Physical activity Advanced directives List of other physicians Hospitalizations, surgeries, and ER visits in previous 12 months Vitals Screenings to include cognitive, depression, and falls Referrals and appointments  In addition, I have reviewed and discussed with patient certain preventive protocols, quality metrics, and best practice recommendations. A written personalized care plan for preventive services as well as general preventive health recommendations were provided to patient.     Sharon Seller, NP   01/31/2023

## 2023-01-31 NOTE — Patient Instructions (Signed)
  [name] , Thank you for taking time to come for your Medicare Wellness Visit. I appreciate your ongoing commitment to your health goals. Please review the following plan we discussed and let me know if I can assist you in the future.   These are the goals we discussed:  Goals      Patient Stated     To enjoy time she has left.         This is a list of the screening recommended for you and due dates:  Health Maintenance  Topic Date Due   COVID-19 Vaccine (6 - 2023-24 season) 01/12/2023   DTaP/Tdap/Td vaccine (2 - Td or Tdap) 12/07/2023   Medicare Annual Wellness Visit  01/31/2024   Pneumonia Vaccine  Completed   Flu Shot  Completed   DEXA scan (bone density measurement)  Completed   Zoster (Shingles) Vaccine  Completed   HPV Vaccine  Aged Out   Colon Cancer Screening  Discontinued    To get COVID booster at pharmacy in 1 week.

## 2023-02-03 ENCOUNTER — Ambulatory Visit (INDEPENDENT_AMBULATORY_CARE_PROVIDER_SITE_OTHER): Payer: PPO | Admitting: Nurse Practitioner

## 2023-02-03 ENCOUNTER — Encounter: Payer: Self-pay | Admitting: Nurse Practitioner

## 2023-02-03 VITALS — BP 124/74 | HR 78 | Temp 97.9°F | Ht 63.0 in | Wt 184.0 lb

## 2023-02-03 DIAGNOSIS — I8500 Esophageal varices without bleeding: Secondary | ICD-10-CM

## 2023-02-03 DIAGNOSIS — F331 Major depressive disorder, recurrent, moderate: Secondary | ICD-10-CM | POA: Diagnosis not present

## 2023-02-03 DIAGNOSIS — H9193 Unspecified hearing loss, bilateral: Secondary | ICD-10-CM | POA: Diagnosis not present

## 2023-02-03 DIAGNOSIS — R739 Hyperglycemia, unspecified: Secondary | ICD-10-CM | POA: Diagnosis not present

## 2023-02-03 DIAGNOSIS — I5032 Chronic diastolic (congestive) heart failure: Secondary | ICD-10-CM | POA: Diagnosis not present

## 2023-02-03 DIAGNOSIS — R413 Other amnesia: Secondary | ICD-10-CM

## 2023-02-03 DIAGNOSIS — E782 Mixed hyperlipidemia: Secondary | ICD-10-CM | POA: Diagnosis not present

## 2023-02-03 DIAGNOSIS — K219 Gastro-esophageal reflux disease without esophagitis: Secondary | ICD-10-CM | POA: Diagnosis not present

## 2023-02-03 DIAGNOSIS — I480 Paroxysmal atrial fibrillation: Secondary | ICD-10-CM

## 2023-02-03 DIAGNOSIS — J4520 Mild intermittent asthma, uncomplicated: Secondary | ICD-10-CM

## 2023-02-03 DIAGNOSIS — J42 Unspecified chronic bronchitis: Secondary | ICD-10-CM

## 2023-02-03 DIAGNOSIS — M1990 Unspecified osteoarthritis, unspecified site: Secondary | ICD-10-CM

## 2023-02-03 DIAGNOSIS — N644 Mastodynia: Secondary | ICD-10-CM

## 2023-02-03 DIAGNOSIS — I10 Essential (primary) hypertension: Secondary | ICD-10-CM

## 2023-02-03 NOTE — Progress Notes (Signed)
Careteam: Patient Care Team: Sharon Seller, NP as PCP - General (Nurse Practitioner) Kathleene Hazel, MD as PCP - Cardiology (Cardiology)  PLACE OF SERVICE:  [facility]  Advanced Directive information Does Patient Have a Medical Advance Directive?: Yes, Type of Advance Directive: Out of facility DNR (pink MOST or yellow form), Pre-existing out of facility DNR order (yellow form or pink MOST form): Yellow form placed in chart (order not valid for inpatient use);Pink MOST form placed in chart (order not valid for inpatient use), Does patient want to make changes to medical advance directive?: No - Patient declined  Allergies  Allergen Reactions   Wixela Inhub [Fluticasone-Salmeterol] Shortness Of Breath   Ace Inhibitors     cough   Amoxicillin Itching    REACTION: unknown? pain in right kidney   Benicar Hct [Olmesartan Medoxomil-Hctz]     Extreme weakness   Budesonide-Formoterol Fumarate     Causes tremors and numbness   Chlorzoxazone Hives   Chlorzoxazone [Chlorzoxazone]    Citalopram Hydrobromide     REACTION: hives   Citalopram Hydrobromide    Clonidine Derivatives    Cymbalta [Duloxetine Hcl]    Droperidol     REACTION: hives   Flexeril [Cyclobenzaprine Hcl]    Fluconazole Nausea And Vomiting and Other (See Comments)    fatigue   Formoterol Itching   Gabapentin Itching   Ketorolac Tromethamine     REACTION: hives   Ketorolac Tromethamine    Metoclopramide Hcl    Mometasone Furo-Formoterol Fum     Causes sore throat, blurred vision and unable to sleep--started on med 09-17-10   Mometasone Furoate Itching   Morphine     REACTION: hives and itching   Olmesartan Medoxomil     REACTION: fatique   Breo Ellipta [Fluticasone Furoate-Vilanterol] Itching    Pt reports itching with Virgel Bouquet is related to being lactose intolerant.     Chief Complaint  Patient presents with   Medical Management of Chronic Issues    4 month follow-up and discuss labs. Knee pain  8/10, feet and hand cramps, mental decline, decreased hearing, ringing in ears, worsening allergies, and overall weakness.      HPI: Patient is a 82 y.o. female presents for 65-month follow-up. Son moved to [place] from [place] to help with her care.   Has help through her insurance, someone comes to her house once a month to help her clean and take her shopping, etc. Uses [employer] for transportation to try to keep her independence.  Is having more weakness and pain in her knees, cortisone shots every 3 months, PRN tylenol. Uses cane and walker. Denies falls. States she's not a candidate for knee replacement. She also reports extreme weakness, on rare occasions, she feels like she is unable to get up, even to eat.  Hands are also cramping and stiffening up 3-4 times per week. Feels like she needs to "pull fingers apart" and massage them.   States she is having issues with her hearing, has not been to be assessed for hearing aids in a while. Unsure if this is causing her not to be as "sharp" mentally. Requires her TV to be louder now and needs to lean in to hear people more. Is having ringing in her ears intermittently. She feels like she's forgetting things more often. Has family history of Alzheimer's disease and she's concerned about this.   States her allergies are worse, taking cetirizine. Worsen with dust and pollen.   Labs reviewed, A1c  and fasting glucose elevated. States she has improved diet, limit fried foods and sugars but does require cortisone injections every 3 months.    Is having tenderness on left outer breast. Just noticed about 2 weeks ago.   Review of Systems:  Review of Systems  Constitutional:  Negative for chills, fever and malaise/fatigue.  HENT:  Positive for hearing loss and tinnitus.   Respiratory:  Positive for cough and shortness of breath.   Cardiovascular:  Positive for leg swelling. Negative for chest pain and palpitations.  Gastrointestinal:  Negative for  constipation, diarrhea, nausea and vomiting.  Genitourinary:  Positive for frequency (on lasix). Negative for dysuria and urgency.  Musculoskeletal:  Positive for joint pain (knees).  Neurological:  Positive for dizziness (occasional) and focal weakness (knees). Negative for headaches.  Psychiatric/Behavioral:  Negative for depression. The patient is not nervous/anxious.     Past Medical History:  Diagnosis Date   (HFpEF) heart failure with preserved ejection fraction (HCC) 12/26/2021   Echocardiogram 12/2021: EF 55-60, no RWMA, Gr 2 DD, low normal RVSF, mildly elevated PASP (RVSP 38.5), mild LAE, trivial MR   ALLERGIC RHINITIS    Anxiety    Asthma    Blood type O+    Cervical strain, acute    Cirrhosis (HCC)    Colon polyp 2010   adenoma   Diverticulosis of colon    Dizziness    Esophageal varices (HCC)    Fibromyalgia    GERD (gastroesophageal reflux disease)    History of nephrolithiasis    Hypercholesterolemia    borderline   Hypertension    IBS (irritable bowel syndrome)    Osteoarthritis    Personal history of allergy to unspecified medicinal agent    Postconcussion syndrome    Vitamin D deficiency    Past Surgical History:  Procedure Laterality Date   CHOLECYSTECTOMY, LAPAROSCOPIC  2004   for gallstone pancreatitis   KNEE SURGERY Right    KNEE SURGERY Left    THYROID SURGERY     cyst removed   Social History:   reports that she quit smoking about 60 years ago. Her smoking use included cigarettes. She started smoking about 68 years ago. She has a 16 pack-year smoking history. She has never used smokeless tobacco. She reports that she does not drink alcohol and does not use drugs.  Family History  Problem Relation Age of Onset   Breast cancer Mother    Alzheimer's disease Mother    Ovarian cancer Sister    Heart disease Sister    Breast cancer Sister 67   Alcohol abuse Sister    COPD Brother    Alcohol abuse Brother    Ovarian cancer Paternal Grandmother     Heart attack Maternal Uncle    COPD Cousin        Maternal side    Arthritis Other        Cousins    Colon cancer Neg Hx    Esophageal cancer Neg Hx     Medications: Patient's Medications  New Prescriptions   No medications on file  Previous Medications   ACETAMINOPHEN (TYLENOL) 500 MG TABLET    Take 1,000 mg by mouth as needed.   ALBUTEROL (PROAIR HFA) 108 (90 BASE) MCG/ACT INHALER    INHALE 2 PUFFS EVERY 4 HOURS AS NEEDED INTO THE LUNGS FOR WHEEZING OR SHORTNESS OF BREATH J45.20   AMLODIPINE (NORVASC) 5 MG TABLET    TAKE 1 TABLET (5 MG TOTAL) BY MOUTH DAILY.  APIXABAN (ELIQUIS) 5 MG TABS TABLET    Take 1 tablet (5 mg total) by mouth 2 (two) times daily.   CETIRIZINE (ZYRTEC) 10 MG TABLET    Take 10 mg by mouth daily.   CHOLECALCIFEROL 50 MCG (2000 UT) CAPS    Take 2,000 Units by mouth in the morning and at bedtime.   ESOMEPRAZOLE (NEXIUM) 20 MG CAPSULE    TAKE 1 CAPSULE BY MOUTH EVERY DAY   EZETIMIBE (ZETIA) 10 MG TABLET    TAKE 1 TABLET BY MOUTH EVERY DAY   FUROSEMIDE (LASIX) 20 MG TABLET    TAKE 1 TABLET BY MOUTH EVERY DAY   LOSARTAN (COZAAR) 25 MG TABLET    TAKE 1 TABLET BY MOUTH EVERY DAY   MECLIZINE (ANTIVERT) 12.5 MG TABLET    Take 1 tablet (12.5 mg total) by mouth 3 (three) times daily as needed for dizziness.   OMEGA-3 FATTY ACIDS (FISH OIL MAXIMUM STRENGTH) 1200 MG CAPS    Take 1,200 mg by mouth 2 (two) times daily.    PROPRANOLOL (INDERAL) 10 MG TABLET    TAKE 1 TABLET BY MOUTH TWICE A DAY   PROPYLENE GLYCOL (SYSTANE BALANCE) 0.6 % SOLN    Place 2 drops into both eyes 2 (two) times daily.   SODIUM CHLORIDE (OCEAN) 0.65 % NASAL SPRAY    Place 1 spray into the nose daily.   TIZANIDINE (ZANAFLEX) 4 MG TABLET    Take 4 mg by mouth at bedtime.   TRELEGY ELLIPTA 100-62.5-25 MCG/ACT AEPB    Inhale 1 puff into the lungs daily.  Modified Medications   No medications on file  Discontinued Medications   No medications on file    Physical Exam:  Vitals:   02/03/23 0928  BP:  124/74  Pulse: 78  Temp: 97.9 F (36.6 C)  TempSrc: Temporal  SpO2: 97%  Weight: 83.5 kg  Height: 5\' 3"  (1.6 m)   Body mass index is 32.59 kg/m. Wt Readings from Last 3 Encounters:  02/03/23 83.5 kg  01/31/23 83.9 kg  12/02/22 81.5 kg    Physical Exam Constitutional:      Appearance: Normal appearance. She is obese.  HENT:     Right Ear: Tympanic membrane normal.     Left Ear: Tympanic membrane normal.     Mouth/Throat:     Mouth: Mucous membranes are moist.     Pharynx: Oropharynx is clear.  Cardiovascular:     Rate and Rhythm: Normal rate. Rhythm irregular.     Pulses: Normal pulses.  Pulmonary:     Effort: Pulmonary effort is normal.     Breath sounds: Normal breath sounds.  Chest:  Breasts:    Right: Normal. No swelling, inverted nipple, nipple discharge or tenderness.     Left: Normal. No swelling, inverted nipple or nipple discharge.       Comments: tenderness Abdominal:     General: Bowel sounds are normal.     Palpations: Abdomen is soft.  Skin:    General: Skin is warm and dry.  Neurological:     General: No focal deficit present.     Mental Status: She is alert and oriented to person, place, and time. Mental status is at baseline.     Motor: Weakness (knees) present.     Gait: Gait abnormal.  Psychiatric:        Mood and Affect: Mood normal.        Behavior: Behavior normal.     Labs reviewed: Basic Metabolic  Panel: Recent Labs    08/12/22 1121 09/06/22 1534 01/31/23 1019  NA 142 139 141  K 4.8 3.9 5.2  CL 105 100 105  CO2 28 29 29   GLUCOSE 104* 101* 102*  BUN 20 21 21   CREATININE 0.63 0.76 0.62  CALCIUM 10.0 9.9 9.9   Liver Function Tests: Recent Labs    08/12/22 1121 09/06/22 1534 01/31/23 1019  AST 22 21 22   ALT 15 15 19   BILITOT 0.7 1.0 0.6  PROT 6.7 6.9 6.4   Recent Labs    08/12/22 1121 09/06/22 1534  AMYLASE 133* 102*   No results for input(s): "AMMONIA" in the last 8760 hours. CBC: Recent Labs     04/02/22 0932 09/06/22 1534 01/31/23 1019  WBC 9.9 9.3 6.7  NEUTROABS 6,089 4,418 2,760  HGB 14.1 14.8 14.2  HCT 41.6 44.4 43.1  MCV 88.1 89.0 88.9  PLT 386 334 278   Lipid Panel: Recent Labs    01/31/23 1019  CHOL 178  HDL 66  LDLCALC 93  TRIG 96  CHOLHDL 2.7   TSH: No results for input(s): "TSH" in the last 8760 hours. A1C: Lab Results  Component Value Date   HGBA1C 6.2 (H) 01/31/2023   mini-mental status exam    01/31/2023    9:59 AM 07/16/2017   12:56 PM 07/02/2016    9:19 AM  MMSE - Mini Mental State Exam  Orientation to time 5 5 5   Orientation to Place 5 5 4   Registration 3 3 3   Attention/ Calculation 5 5 5   Recall 2 2 3   Language- name 2 objects 2 2 2   Language- repeat 1 1 1   Language- follow 3 step command 3 3 3   Language- read & follow direction 1 1 1   Write a sentence 1 1 1   Copy design 1 1 1   Total score 29 29 29       Assessment/Plan 1. Essential hypertension -Controlled, BP at goal <140/90 -Continue current medication regimen -Followed by cardiology -Encouraged DASH diet and physical activity as tolerated -Labs reviewed, WNL -CBC w/ differential/platelet -CMP  2. Mixed hyperlipidemia -Continue ezetimibe -Encouraged dietary modifications and physical activity as tolerated -Cholesterol and LDL now normal  3. Hyperglycemia -Elevated A1c and fasting glucose -Encouraged dietary modifications and physical activity as tolerated -Requires cortisone injections every 3 months, likely cause of hyperglycemia???  4. Paroxysmal atrial fibrillation (HCC) -Continue apixaban and propranolol -Followed by cardiology  5. Chronic heart failure with preserved ejection fraction (HCC) -Continue furosemide -No signs of overload -Followed by cardiology  6. Chronic bronchitis, unspecified chronic bronchitis type (HCC) -Stable -Continue trelegy -PRN albuterol -Followed by pulmonology  7. Asthma, allergic, mild intermittent,  uncomplicated -Controlled -Continue trelegy -PRN albuterol -Followed by pulmonology -Continue cetirizine and ocean spray -Avoid triggers  8. Gastroesophageal reflux disease without esophagitis -Controlled -Continue esomeprazole  9. Osteoarthritis, unspecified osteoarthritis type, unspecified site - Taking cortisone injections on her knees every 3 months; not candidate for knee replacement -PRN tylenol, states she rarely needs it -Continue to mobilize as tolerated -Avoid cold, use warming techniques on hands -Fall precautions  10. Moderate episode of recurrent major depressive disorder (HCC) -Ongoing; intermittent -Not on medication -Relies on her faith and states this helps her get through her "bouts" depression  11. Esophageal varices without bleeding, unspecified esophageal varices type (HCC) -Stable -No signs of bleeding; CBC WNL -Followed by GI; needs to make appointment still  12. Bilateral hearing loss, unspecified hearing loss type - Ambulatory referral to Audiology for  evaluation of hearing aides  13. Breast pain, left - MM Digital Diagnostic Unilat L; Future  14. Memory loss MMSE stable on last check, Will continue to follow. Add on b12 and tsh to current labs due to memory loss  Return in about 4 months (around 06/05/2023) for routine follow up .  Rollen Sox, FNP-MSN Student -I personally was present during the history, physical exam and medical decision-making activities of this service and have verified that the service and findings are accurately documented in the student's note Abbey Chatters, NP

## 2023-02-03 NOTE — Patient Instructions (Signed)
To call [phone] to schedule bone density

## 2023-02-04 LAB — CBC WITH DIFFERENTIAL/PLATELET
Absolute Monocytes: 737 cells/uL (ref 200–950)
Basophils Absolute: 47 cells/uL (ref 0–200)
Basophils Relative: 0.7 %
Eosinophils Absolute: 141 cells/uL (ref 15–500)
Eosinophils Relative: 2.1 %
HCT: 43.1 % (ref 35.0–45.0)
Hemoglobin: 14.2 g/dL (ref 11.7–15.5)
Lymphs Abs: 3015 cells/uL (ref 850–3900)
MCH: 29.3 pg (ref 27.0–33.0)
MCHC: 32.9 g/dL (ref 32.0–36.0)
MCV: 88.9 fL (ref 80.0–100.0)
MPV: 10.4 fL (ref 7.5–12.5)
Monocytes Relative: 11 %
Neutro Abs: 2760 cells/uL (ref 1500–7800)
Neutrophils Relative %: 41.2 %
Platelets: 278 10*3/uL (ref 140–400)
RBC: 4.85 10*6/uL (ref 3.80–5.10)
RDW: 12.8 % (ref 11.0–15.0)
Total Lymphocyte: 45 %
WBC: 6.7 10*3/uL (ref 3.8–10.8)

## 2023-02-04 LAB — HEMOGLOBIN A1C
Hgb A1c MFr Bld: 6.2 % of total Hgb — ABNORMAL HIGH (ref <5.7)
Mean Plasma Glucose: 131 mg/dL
eAG (mmol/L): 7.3 mmol/L

## 2023-02-04 LAB — LIPID PANEL
Cholesterol: 178 mg/dL (ref <200)
HDL: 66 mg/dL (ref >=50)
LDL Cholesterol (Calc): 93 mg/dL (calc)
Non-HDL Cholesterol (Calc): 112 mg/dL (calc) (ref <130)
Total CHOL/HDL Ratio: 2.7 (calc) (ref <5.0)
Triglycerides: 96 mg/dL (ref <150)

## 2023-02-04 LAB — COMPLETE METABOLIC PANEL WITH GFR
AG Ratio: 1.9 (calc) (ref 1.0–2.5)
ALT: 19 U/L (ref 6–29)
AST: 22 U/L (ref 10–35)
Albumin: 4.2 g/dL (ref 3.6–5.1)
Alkaline phosphatase (APISO): 91 U/L (ref 37–153)
BUN: 21 mg/dL (ref 7–25)
CO2: 29 mmol/L (ref 20–32)
Calcium: 9.9 mg/dL (ref 8.6–10.4)
Chloride: 105 mmol/L (ref 98–110)
Creat: 0.62 mg/dL (ref 0.60–0.95)
Globulin: 2.2 g/dL (calc) (ref 1.9–3.7)
Glucose, Bld: 102 mg/dL — ABNORMAL HIGH (ref 65–99)
Potassium: 5.2 mmol/L (ref 3.5–5.3)
Sodium: 141 mmol/L (ref 135–146)
Total Bilirubin: 0.6 mg/dL (ref 0.2–1.2)
Total Protein: 6.4 g/dL (ref 6.1–8.1)
eGFR: 88 mL/min/{1.73_m2} (ref >=60)

## 2023-02-04 LAB — VITAMIN B12: Vitamin B-12: 258 pg/mL (ref 200–1100)

## 2023-02-04 LAB — TSH: TSH: 2.4 mIU/L (ref 0.40–4.50)

## 2023-02-04 LAB — TEST AUTHORIZATION

## 2023-02-10 ENCOUNTER — Other Ambulatory Visit: Payer: Self-pay | Admitting: Nurse Practitioner

## 2023-02-10 ENCOUNTER — Other Ambulatory Visit: Payer: Self-pay

## 2023-02-10 DIAGNOSIS — N644 Mastodynia: Secondary | ICD-10-CM

## 2023-02-10 MED ORDER — VITAMIN B-12 1000 MCG PO TABS: 1000.0000 ug | ORAL_TABLET | Freq: Every day | ORAL | Status: AC

## 2023-02-13 ENCOUNTER — Telehealth: Payer: Self-pay

## 2023-02-13 NOTE — Telephone Encounter (Signed)
Patient states she use to take Vitamin D3 2000 units but its no longer on her medication list. She wants to know did you take her off?

## 2023-02-14 NOTE — Telephone Encounter (Signed)
Called patient to let her know know she can continue taking vitamin D3

## 2023-02-14 NOTE — Telephone Encounter (Signed)
Unsure why it was removed, okay to continue to take daily

## 2023-02-21 DIAGNOSIS — H9312 Tinnitus, left ear: Secondary | ICD-10-CM | POA: Diagnosis not present

## 2023-02-21 DIAGNOSIS — H903 Sensorineural hearing loss, bilateral: Secondary | ICD-10-CM | POA: Diagnosis not present

## 2023-02-25 DIAGNOSIS — Z515 Encounter for palliative care: Secondary | ICD-10-CM | POA: Diagnosis not present

## 2023-02-25 DIAGNOSIS — I5032 Chronic diastolic (congestive) heart failure: Secondary | ICD-10-CM | POA: Diagnosis not present

## 2023-02-25 DIAGNOSIS — Z6834 Body mass index (BMI) 34.0-34.9, adult: Secondary | ICD-10-CM | POA: Diagnosis not present

## 2023-03-05 ENCOUNTER — Other Ambulatory Visit: Payer: PPO

## 2023-03-11 ENCOUNTER — Ambulatory Visit (INDEPENDENT_AMBULATORY_CARE_PROVIDER_SITE_OTHER): Payer: PPO | Admitting: Adult Health

## 2023-03-11 ENCOUNTER — Encounter: Payer: Self-pay | Admitting: Adult Health

## 2023-03-11 VITALS — BP 128/78 | HR 82 | Temp 97.3°F | Resp 18 | Ht 63.0 in | Wt 187.6 lb

## 2023-03-11 DIAGNOSIS — M79672 Pain in left foot: Secondary | ICD-10-CM | POA: Diagnosis not present

## 2023-03-11 DIAGNOSIS — M79671 Pain in right foot: Secondary | ICD-10-CM | POA: Diagnosis not present

## 2023-03-11 DIAGNOSIS — I480 Paroxysmal atrial fibrillation: Secondary | ICD-10-CM | POA: Diagnosis not present

## 2023-03-11 DIAGNOSIS — I10 Essential (primary) hypertension: Secondary | ICD-10-CM

## 2023-03-11 MED ORDER — PREDNISONE 20 MG PO TABS
ORAL_TABLET | ORAL | 0 refills | Status: AC
Start: 2023-03-11 — End: 2023-03-17

## 2023-03-11 NOTE — Progress Notes (Signed)
[facility]  Provider: Kenard Gower DNP  Code Status:  DNR  Goals of Care:     03/11/2023    1:09 PM  Advanced Directives  Does Patient Have a Medical Advance Directive? Yes  Type of Advance Directive Out of facility DNR (pink MOST or yellow form)  Does patient want to make changes to medical advance directive? No - Patient declined  Pre-existing out of facility DNR order (yellow form or pink MOST form) Yellow form placed in chart (order not valid for inpatient use);Pink MOST form placed in chart (order not valid for inpatient use)     Chief Complaint  Patient presents with   Acute Visit    pain in feet up her legs     HPI: Patient is a 81 y.o. female seen today for an acute visit for feet pain up to her legs. She rated her lower extremity pain as 8/10. Pain started 2 years ago, on and off. She walks with a rollator when outside and cane when in the house.  BP 128/78, takes amlodipine and losartan for hypertension.   Past Medical History:  Diagnosis Date   (HFpEF) heart failure with preserved ejection fraction (HCC) 12/26/2021   Echocardiogram 12/2021: EF 55-60, no RWMA, Gr 2 DD, low normal RVSF, mildly elevated PASP (RVSP 38.5), mild LAE, trivial MR   ALLERGIC RHINITIS    Anxiety    Asthma    Blood type O+    Cervical strain, acute    Cirrhosis (HCC)    Colon polyp 2010   adenoma   Diverticulosis of colon    Dizziness    Esophageal varices (HCC)    Fibromyalgia    GERD (gastroesophageal reflux disease)    History of nephrolithiasis    Hypercholesterolemia    borderline   Hypertension    IBS (irritable bowel syndrome)    Osteoarthritis    Personal history of allergy to unspecified medicinal agent    Postconcussion syndrome    Vitamin D deficiency     Past Surgical History:  Procedure Laterality Date   CHOLECYSTECTOMY, LAPAROSCOPIC  2004   for gallstone pancreatitis   KNEE SURGERY Right    KNEE SURGERY Left    THYROID SURGERY     cyst removed     Allergies  Allergen Reactions   Wixela Inhub [Fluticasone-Salmeterol] Shortness Of Breath   Ace Inhibitors     cough   Amoxicillin Itching    REACTION: unknown? pain in right kidney   Benicar Hct [Olmesartan Medoxomil-Hctz]     Extreme weakness   Budesonide-Formoterol Fumarate     Causes tremors and numbness   Chlorzoxazone Hives   Chlorzoxazone [Chlorzoxazone]    Citalopram Hydrobromide     REACTION: hives   Citalopram Hydrobromide    Clonidine Derivatives    Cymbalta [Duloxetine Hcl]    Droperidol     REACTION: hives   Flexeril [Cyclobenzaprine Hcl]    Fluconazole Nausea And Vomiting and Other (See Comments)    fatigue   Formoterol Itching   Gabapentin Itching   Ketorolac Tromethamine     REACTION: hives   Ketorolac Tromethamine    Metoclopramide Hcl    Mometasone Furo-Formoterol Fum     Causes sore throat, blurred vision and unable to sleep--started on med 09-17-10   Mometasone Furoate Itching   Morphine     REACTION: hives and itching   Olmesartan Medoxomil     REACTION: fatique   Breo Ellipta [Fluticasone Furoate-Vilanterol] Itching    Pt  reports itching with Virgel Bouquet is related to being lactose intolerant.     Outpatient Encounter Medications as of 03/11/2023  Medication Sig   acetaminophen (TYLENOL) 500 MG tablet Take 1,000 mg by mouth as needed.   albuterol (PROAIR HFA) 108 (90 Base) MCG/ACT inhaler INHALE 2 PUFFS EVERY 4 HOURS AS NEEDED INTO THE LUNGS FOR WHEEZING OR SHORTNESS OF BREATH J45.20   amLODipine (NORVASC) 5 MG tablet TAKE 1 TABLET (5 MG TOTAL) BY MOUTH DAILY.   apixaban (ELIQUIS) 5 MG TABS tablet Take 1 tablet (5 mg total) by mouth 2 (two) times daily.   cetirizine (ZYRTEC) 10 MG tablet Take 10 mg by mouth daily.   Cholecalciferol 50 MCG (2000 UT) CAPS Take 2,000 Units by mouth in the morning and at bedtime.   cyanocobalamin (VITAMIN B12) 1000 MCG tablet Take 1 tablet (1,000 mcg total) by mouth daily.   esomeprazole (NEXIUM) 20 MG capsule TAKE 1  CAPSULE BY MOUTH EVERY DAY   ezetimibe (ZETIA) 10 MG tablet TAKE 1 TABLET BY MOUTH EVERY DAY   furosemide (LASIX) 20 MG tablet TAKE 1 TABLET BY MOUTH EVERY DAY   losartan (COZAAR) 25 MG tablet TAKE 1 TABLET BY MOUTH EVERY DAY   meclizine (ANTIVERT) 12.5 MG tablet Take 1 tablet (12.5 mg total) by mouth 3 (three) times daily as needed for dizziness.   Omega-3 Fatty Acids (FISH OIL MAXIMUM STRENGTH) 1200 MG CAPS Take 1,200 mg by mouth 2 (two) times daily.    propranolol (INDERAL) 10 MG tablet TAKE 1 TABLET BY MOUTH TWICE A DAY   Propylene Glycol (SYSTANE BALANCE) 0.6 % SOLN Place 2 drops into both eyes 2 (two) times daily.   sodium chloride (OCEAN) 0.65 % nasal spray Place 1 spray into the nose daily.   tiZANidine (ZANAFLEX) 4 MG tablet Take 4 mg by mouth at bedtime.   TRELEGY ELLIPTA 100-62.5-25 MCG/ACT AEPB Inhale 1 puff into the lungs daily.   No facility-administered encounter medications on file as of 03/11/2023.    Review of Systems:  Review of Systems  Constitutional:  Negative for appetite change, chills, fatigue and fever.  HENT:  Negative for congestion, hearing loss, rhinorrhea and sore throat.   Eyes: Negative.   Respiratory:  Negative for cough, shortness of breath and wheezing.   Cardiovascular:  Negative for chest pain, palpitations and leg swelling.  Gastrointestinal:  Negative for abdominal pain, constipation, diarrhea, nausea and vomiting.  Genitourinary:  Negative for dysuria.  Musculoskeletal:  Negative for arthralgias, back pain and myalgias.       Bilateral feet hurting  Skin:  Negative for color change, rash and wound.  Neurological:  Negative for dizziness, weakness and headaches.  Psychiatric/Behavioral:  Negative for behavioral problems. The patient is not nervous/anxious.     Health Maintenance  Topic Date Due   COVID-19 Vaccine (7 - 2023-24 season) 07/01/2023   DTaP/Tdap/Td (2 - Td or Tdap) 12/07/2023   Medicare Annual Wellness (AWV)  01/31/2024    Pneumonia Vaccine 6+ Years old  Completed   INFLUENZA VACCINE  Completed   DEXA SCAN  Completed   Zoster Vaccines- Shingrix  Completed   HPV VACCINES  Aged Out   Colonoscopy  Discontinued    Physical Exam: Vitals:   03/11/23 1310  BP: 128/78  Pulse: 82  Resp: 18  Temp: (!) 97.3 F (36.3 C)  SpO2: 95%  Weight: 187 lb 9.6 oz (85.1 kg)  Height: 5\' 3"  (1.6 m)   Body mass index is 33.23 kg/m. Physical Exam  Constitutional:      General: She is not in acute distress.    Appearance: She is obese.  HENT:     Head: Normocephalic and atraumatic.     Nose: Nose normal.     Mouth/Throat:     Mouth: Mucous membranes are moist.  Eyes:     Conjunctiva/sclera: Conjunctivae normal.  Cardiovascular:     Rate and Rhythm: Normal rate and regular rhythm.  Pulmonary:     Effort: Pulmonary effort is normal.     Breath sounds: Normal breath sounds.  Abdominal:     General: Bowel sounds are normal.     Palpations: Abdomen is soft.  Musculoskeletal:        General: Normal range of motion.     Cervical back: Normal range of motion.     Right lower leg: Edema present.     Left lower leg: Edema present.     Comments: Ankle 1+edema, bilateral  Skin:    General: Skin is warm and dry.  Neurological:     General: No focal deficit present.     Mental Status: She is alert and oriented to person, place, and time.  Psychiatric:        Mood and Affect: Mood normal.        Behavior: Behavior normal.        Thought Content: Thought content normal.        Judgment: Judgment normal.     Labs reviewed: Basic Metabolic Panel: Recent Labs    08/12/22 1121 09/06/22 1534 01/31/23 1019  NA 142 139 141  K 4.8 3.9 5.2  CL 105 100 105  CO2 28 29 29   GLUCOSE 104* 101* 102*  BUN 20 21 21   CREATININE 0.63 0.76 0.62  CALCIUM 10.0 9.9 9.9  TSH  --   --  2.40   Liver Function Tests: Recent Labs    08/12/22 1121 09/06/22 1534 01/31/23 1019  AST 22 21 22   ALT 15 15 19   BILITOT 0.7 1.0 0.6   PROT 6.7 6.9 6.4   Recent Labs    08/12/22 1121 09/06/22 1534  AMYLASE 133* 102*   No results for input(s): "AMMONIA" in the last 8760 hours. CBC: Recent Labs    04/02/22 0932 09/06/22 1534 01/31/23 1019  WBC 9.9 9.3 6.7  NEUTROABS 6,089 4,418 2,760  HGB 14.1 14.8 14.2  HCT 41.6 44.4 43.1  MCV 88.1 89.0 88.9  PLT 386 334 278   Lipid Panel: Recent Labs    01/31/23 1019  CHOL 178  HDL 66  LDLCALC 93  TRIG 96  CHOLHDL 2.7   Lab Results  Component Value Date   HGBA1C 6.2 (H) 01/31/2023    Procedures since last visit: No results found.  Assessment/Plan  1. Pain in both feet - predniSONE (DELTASONE) 20 MG tablet; Take 3 tablets (60 mg total) by mouth daily with breakfast for 2 days, THEN 2 tablets (40 mg total) daily with breakfast for 2 days, THEN 1 tablet (20 mg total) daily with breakfast for 2 days.  Dispense: 12 tablet; Refill: 0  2. Essential hypertension -    BP stable -     Continue amlodipine and losartan  3. Paroxysmal atrial fibrillation (HCC) -    Rate controlled -    Continue Eliquis for anticoagulation and propranolol for rate control    Labs/tests ordered:  None   Next appt:  06/06/2023

## 2023-03-18 DIAGNOSIS — H9313 Tinnitus, bilateral: Secondary | ICD-10-CM | POA: Diagnosis not present

## 2023-03-18 DIAGNOSIS — H9113 Presbycusis, bilateral: Secondary | ICD-10-CM | POA: Diagnosis not present

## 2023-03-18 DIAGNOSIS — H9202 Otalgia, left ear: Secondary | ICD-10-CM | POA: Diagnosis not present

## 2023-03-20 ENCOUNTER — Other Ambulatory Visit: Payer: Self-pay | Admitting: Cardiovascular Disease

## 2023-03-20 DIAGNOSIS — I480 Paroxysmal atrial fibrillation: Secondary | ICD-10-CM

## 2023-03-20 NOTE — Telephone Encounter (Signed)
Eliquis 5mg  refill request received. Patient is 82 years old, weight-85.1kg, Crea-0.62 on 01/31/23, Diagnosis-Afib, and last seen by Dr. Clifton James on 12/02/22. Dose is appropriate based on dosing criteria. Will send in refill to requested pharmacy.

## 2023-03-21 ENCOUNTER — Encounter: Payer: Self-pay | Admitting: Gastroenterology

## 2023-04-15 DIAGNOSIS — L570 Actinic keratosis: Secondary | ICD-10-CM | POA: Diagnosis not present

## 2023-04-15 DIAGNOSIS — L821 Other seborrheic keratosis: Secondary | ICD-10-CM | POA: Diagnosis not present

## 2023-04-15 DIAGNOSIS — Z85828 Personal history of other malignant neoplasm of skin: Secondary | ICD-10-CM | POA: Diagnosis not present

## 2023-04-25 ENCOUNTER — Ambulatory Visit (INDEPENDENT_AMBULATORY_CARE_PROVIDER_SITE_OTHER): Payer: PPO | Admitting: Nurse Practitioner

## 2023-04-25 ENCOUNTER — Encounter: Payer: Self-pay | Admitting: Nurse Practitioner

## 2023-04-25 VITALS — BP 128/80 | HR 74 | Temp 97.9°F | Ht 63.0 in | Wt 184.8 lb

## 2023-04-25 DIAGNOSIS — S90129A Contusion of unspecified lesser toe(s) without damage to nail, initial encounter: Secondary | ICD-10-CM | POA: Diagnosis not present

## 2023-04-25 DIAGNOSIS — M79674 Pain in right toe(s): Secondary | ICD-10-CM | POA: Diagnosis not present

## 2023-04-25 NOTE — Progress Notes (Signed)
Careteam: Patient Care Team: Sharon Seller, NP as PCP - General (Nurse Practitioner) Kathleene Hazel, MD as PCP - Cardiology (Cardiology)  PLACE OF SERVICE:  [facility]  Advanced Directive information Does Patient Have a Medical Advance Directive?: Yes, Type of Advance Directive: Out of facility DNR (pink MOST or yellow form), Pre-existing out of facility DNR order (yellow form or pink MOST form): Yellow form placed in chart (order not valid for inpatient use);Pink MOST form placed in chart (order not valid for inpatient use), Does patient want to make changes to medical advance directive?: No - Patient declined  Allergies  Allergen Reactions   Wixela Inhub [Fluticasone-Salmeterol] Shortness Of Breath   Ace Inhibitors     cough   Amoxicillin Itching    REACTION: unknown? pain in right kidney   Benicar Hct [Olmesartan Medoxomil-Hctz]     Extreme weakness   Budesonide-Formoterol Fumarate     Causes tremors and numbness   Chlorzoxazone Hives   Chlorzoxazone [Chlorzoxazone]    Citalopram Hydrobromide     REACTION: hives   Citalopram Hydrobromide    Clonidine Derivatives    Cymbalta [Duloxetine Hcl]    Droperidol     REACTION: hives   Flexeril [Cyclobenzaprine Hcl]    Fluconazole Nausea And Vomiting and Other (See Comments)    fatigue   Formoterol Itching   Gabapentin Itching   Ketorolac Tromethamine     REACTION: hives   Ketorolac Tromethamine    Metoclopramide Hcl    Mometasone Furo-Formoterol Fum     Causes sore throat, blurred vision and unable to sleep--started on med 09-17-10   Mometasone Furoate Itching   Morphine     REACTION: hives and itching   Olmesartan Medoxomil     REACTION: fatique   Breo Ellipta [Fluticasone Furoate-Vilanterol] Itching    Pt reports itching with Virgel Bouquet is related to being lactose intolerant.     Chief Complaint  Patient presents with   Acute Visit    Right foot/leg swelling and discolored (specifically the pinky toe).  Patient also with possible swelling in wrist area.      HPI: Patient is a 82 y.o. female due to right 5th toe discoloration Reports she dropped something on her foot a few days ago. Reports yesterday she did not notice any changes but this morning noted to be darker colored and more swelling. She has some chronic swelling in her LE worse at the end of the day.  She can not tolerate compressor hose when the weather is warm but able to with cool weather Toe is tender.   Review of Systems:  Review of Systems  Constitutional:  Negative for chills, fever and weight loss.  Musculoskeletal:  Positive for joint pain. Negative for myalgias.  Skin:        Bruising to toe    Past Medical History:  Diagnosis Date   (HFpEF) heart failure with preserved ejection fraction (HCC) 12/26/2021   Echocardiogram 12/2021: EF 55-60, no RWMA, Gr 2 DD, low normal RVSF, mildly elevated PASP (RVSP 38.5), mild LAE, trivial MR   ALLERGIC RHINITIS    Anxiety    Asthma    Blood type O+    Cervical strain, acute    Cirrhosis (HCC)    Colon polyp 2010   adenoma   Diverticulosis of colon    Dizziness    Esophageal varices (HCC)    Fibromyalgia    GERD (gastroesophageal reflux disease)    History of nephrolithiasis  Hypercholesterolemia    borderline   Hypertension    IBS (irritable bowel syndrome)    Osteoarthritis    Personal history of allergy to unspecified medicinal agent    Postconcussion syndrome    Vitamin D deficiency    Past Surgical History:  Procedure Laterality Date   CHOLECYSTECTOMY, LAPAROSCOPIC  2004   for gallstone pancreatitis   KNEE SURGERY Right    KNEE SURGERY Left    THYROID SURGERY     cyst removed   Social History:   reports that she quit smoking about 60 years ago. Her smoking use included cigarettes. She started smoking about 68 years ago. She has a 16 pack-year smoking history. She has never used smokeless tobacco. She reports that she does not drink alcohol and does  not use drugs.  Family History  Problem Relation Age of Onset   Breast cancer Mother    Alzheimer's disease Mother    Ovarian cancer Sister    Heart disease Sister    Breast cancer Sister 31   Alcohol abuse Sister    COPD Brother    Alcohol abuse Brother    Ovarian cancer Paternal Grandmother    Heart attack Maternal Uncle    COPD Cousin        Maternal side    Arthritis Other        Cousins    Colon cancer Neg Hx    Esophageal cancer Neg Hx     Medications: Patient's Medications  New Prescriptions   No medications on file  Previous Medications   ACETAMINOPHEN (TYLENOL) 500 MG TABLET    Take 1,000 mg by mouth as needed.   ALBUTEROL (PROAIR HFA) 108 (90 BASE) MCG/ACT INHALER    INHALE 2 PUFFS EVERY 4 HOURS AS NEEDED INTO THE LUNGS FOR WHEEZING OR SHORTNESS OF BREATH J45.20   AMLODIPINE (NORVASC) 5 MG TABLET    TAKE 1 TABLET (5 MG TOTAL) BY MOUTH DAILY.   CETIRIZINE (ZYRTEC) 10 MG TABLET    Take 10 mg by mouth daily.   CHOLECALCIFEROL 50 MCG (2000 UT) CAPS    Take 2,000 Units by mouth in the morning and at bedtime.   CYANOCOBALAMIN (VITAMIN B12) 1000 MCG TABLET    Take 1 tablet (1,000 mcg total) by mouth daily.   ELIQUIS 5 MG TABS TABLET    TAKE 1 TABLET BY MOUTH TWICE A DAY   ESOMEPRAZOLE (NEXIUM) 20 MG CAPSULE    TAKE 1 CAPSULE BY MOUTH EVERY DAY   EZETIMIBE (ZETIA) 10 MG TABLET    TAKE 1 TABLET BY MOUTH EVERY DAY   FUROSEMIDE (LASIX) 20 MG TABLET    TAKE 1 TABLET BY MOUTH EVERY DAY   LOSARTAN (COZAAR) 25 MG TABLET    TAKE 1 TABLET BY MOUTH EVERY DAY   MECLIZINE (ANTIVERT) 12.5 MG TABLET    Take 1 tablet (12.5 mg total) by mouth 3 (three) times daily as needed for dizziness.   OMEGA-3 FATTY ACIDS (FISH OIL MAXIMUM STRENGTH) 1200 MG CAPS    Take 1,200 mg by mouth 2 (two) times daily.    PROPRANOLOL (INDERAL) 10 MG TABLET    TAKE 1 TABLET BY MOUTH TWICE A DAY   PROPYLENE GLYCOL (SYSTANE BALANCE) 0.6 % SOLN    Place 2 drops into both eyes 2 (two) times daily.   SODIUM CHLORIDE  (OCEAN) 0.65 % NASAL SPRAY    Place 1 spray into the nose daily.   TIZANIDINE (ZANAFLEX) 4 MG TABLET    Take  4 mg by mouth at bedtime.   TRELEGY ELLIPTA 100-62.5-25 MCG/ACT AEPB    Inhale 1 puff into the lungs daily.  Modified Medications   No medications on file  Discontinued Medications   No medications on file    Physical Exam:  Vitals:   04/25/23 0908  BP: 128/80  Pulse: 74  Temp: 97.9 F (36.6 C)  TempSrc: Temporal  SpO2: 97%  Weight: 184 lb 12.8 oz (83.8 kg)  Height: 5\' 3"  (1.6 m)   Body mass index is 32.74 kg/m. Wt Readings from Last 3 Encounters:  04/25/23 184 lb 12.8 oz (83.8 kg)  03/11/23 187 lb 9.6 oz (85.1 kg)  02/03/23 184 lb (83.5 kg)    Physical Exam Constitutional:      Appearance: Normal appearance.  Pulmonary:     Effort: Pulmonary effort is normal.  Musculoskeletal:       Feet:  Feet:     Left foot:     Toenail Condition: Left toenails are normal.     Comments: ecchymosis to right 5th toe tenderness and mild swelling noted to toe and foot Neurological:     Mental Status: She is alert. Mental status is at baseline.  Psychiatric:        Mood and Affect: Mood normal.     Labs reviewed: Basic Metabolic Panel: Recent Labs    08/12/22 1121 09/06/22 1534 01/31/23 1019  NA 142 139 141  K 4.8 3.9 5.2  CL 105 100 105  CO2 28 29 29   GLUCOSE 104* 101* 102*  BUN 20 21 21   CREATININE 0.63 0.76 0.62  CALCIUM 10.0 9.9 9.9  TSH  --   --  2.40   Liver Function Tests: Recent Labs    08/12/22 1121 09/06/22 1534 01/31/23 1019  AST 22 21 22   ALT 15 15 19   BILITOT 0.7 1.0 0.6  PROT 6.7 6.9 6.4   Recent Labs    08/12/22 1121 09/06/22 1534  AMYLASE 133* 102*   No results for input(s): "AMMONIA" in the last 8760 hours. CBC: Recent Labs    09/06/22 1534 01/31/23 1019  WBC 9.3 6.7  NEUTROABS 4,418 2,760  HGB 14.8 14.2  HCT 44.4 43.1  MCV 89.0 88.9  PLT 334 278   Lipid Panel: Recent Labs    01/31/23 1019  CHOL 178  HDL 66   LDLCALC 93  TRIG 96  CHOLHDL 2.7   TSH: Recent Labs    01/31/23 1019  TSH 2.40   A1C: Lab Results  Component Value Date   HGBA1C 6.2 (H) 01/31/2023     Assessment/Plan 1. Contusion of toe without damage to nail, unspecified laterality, unspecified toe, initial encounter (Primary) With mild pain, ecchymosis and edema   - DG Foot Complete Right; Future  2. Pain of toe of right foot Due to contusion Continue tylenol 1000 mg BID To ice TID  - DG Foot Complete Right; Future  Strict follow up precautions discussed, plans to get xray if pain/swelling and bruising does not improve or worsens  Vega Withrow K. Biagio Borg [facility] [phone]

## 2023-04-25 NOTE — Patient Instructions (Addendum)
-  encouraged to elevate legs above level of heart as tolerates, low sodium diet, compression hose as tolerates (on in am, off in pm)  To apply ice to toe/foot 3 times daily  Elevate as tolerate  Stay off foot over the next 3 days as you can

## 2023-04-29 ENCOUNTER — Other Ambulatory Visit: Payer: Self-pay | Admitting: Nurse Practitioner

## 2023-04-29 DIAGNOSIS — K219 Gastro-esophageal reflux disease without esophagitis: Secondary | ICD-10-CM

## 2023-05-02 ENCOUNTER — Other Ambulatory Visit: Payer: Self-pay | Admitting: Pulmonary Disease

## 2023-05-20 ENCOUNTER — Telehealth: Payer: Self-pay

## 2023-05-20 NOTE — Telephone Encounter (Signed)
 It could be related to her knee, she could try different rubs like aspercream with lidocaine, bengay, icyhot, or biofreeze

## 2023-05-20 NOTE — Telephone Encounter (Signed)
 Patient called to say that x 3 weeks she is experiencing cramps in her upper legs and at times its so sever it makes her feel faint. Patient is using thermaworks roll-on (OTC) for minimal relief. Patient declined appointment at this time as she would like to know if Harlene has any advice or recommendations. Patient also wonders if this is an issue that could be stemming from her knee pain and should be addressed by the orthopedic specialist

## 2023-05-21 NOTE — Telephone Encounter (Signed)
Patient is aware of Sharon Seller, NP response and verbalized understanding

## 2023-05-22 ENCOUNTER — Other Ambulatory Visit: Payer: Self-pay | Admitting: Nurse Practitioner

## 2023-05-22 DIAGNOSIS — E782 Mixed hyperlipidemia: Secondary | ICD-10-CM

## 2023-05-23 DIAGNOSIS — M25562 Pain in left knee: Secondary | ICD-10-CM | POA: Diagnosis not present

## 2023-05-23 DIAGNOSIS — M25561 Pain in right knee: Secondary | ICD-10-CM | POA: Diagnosis not present

## 2023-06-06 ENCOUNTER — Encounter: Payer: Self-pay | Admitting: Nurse Practitioner

## 2023-06-06 ENCOUNTER — Ambulatory Visit (INDEPENDENT_AMBULATORY_CARE_PROVIDER_SITE_OTHER): Payer: PPO | Admitting: Nurse Practitioner

## 2023-06-06 VITALS — BP 126/72 | HR 61 | Temp 97.6°F | Resp 18 | Ht 63.0 in | Wt 184.4 lb

## 2023-06-06 DIAGNOSIS — J449 Chronic obstructive pulmonary disease, unspecified: Secondary | ICD-10-CM | POA: Diagnosis not present

## 2023-06-06 DIAGNOSIS — I5032 Chronic diastolic (congestive) heart failure: Secondary | ICD-10-CM

## 2023-06-06 DIAGNOSIS — Z66 Do not resuscitate: Secondary | ICD-10-CM

## 2023-06-06 DIAGNOSIS — K219 Gastro-esophageal reflux disease without esophagitis: Secondary | ICD-10-CM | POA: Diagnosis not present

## 2023-06-06 DIAGNOSIS — R739 Hyperglycemia, unspecified: Secondary | ICD-10-CM | POA: Diagnosis not present

## 2023-06-06 DIAGNOSIS — J309 Allergic rhinitis, unspecified: Secondary | ICD-10-CM | POA: Diagnosis not present

## 2023-06-06 DIAGNOSIS — I10 Essential (primary) hypertension: Secondary | ICD-10-CM | POA: Diagnosis not present

## 2023-06-06 DIAGNOSIS — M170 Bilateral primary osteoarthritis of knee: Secondary | ICD-10-CM

## 2023-06-06 DIAGNOSIS — R531 Weakness: Secondary | ICD-10-CM

## 2023-06-06 NOTE — Progress Notes (Signed)
Careteam: Patient Care Team: Sharon Seller, NP as PCP - General (Nurse Practitioner) Kathleene Hazel, MD as PCP - Cardiology (Cardiology)  PLACE OF SERVICE:  [facility]  Advanced Directive information Does Patient Have a Medical Advance Directive?: Yes, Type of Advance Directive: Out of facility DNR (pink MOST or yellow form), Pre-existing out of facility DNR order (yellow form or pink MOST form): Yellow form placed in chart (order not valid for inpatient use);Pink MOST form placed in chart (order not valid for inpatient use), Does patient want to make changes to medical advance directive?: No - Patient declined  Allergies  Allergen Reactions   Wixela Inhub [Fluticasone-Salmeterol] Shortness Of Breath   Ace Inhibitors     cough   Amoxicillin Itching    REACTION: unknown? pain in right kidney   Benicar Hct [Olmesartan Medoxomil-Hctz]     Extreme weakness   Budesonide-Formoterol Fumarate     Causes tremors and numbness   Chlorzoxazone Hives   Chlorzoxazone [Chlorzoxazone]    Citalopram Hydrobromide     REACTION: hives   Citalopram Hydrobromide    Clonidine Derivatives    Cymbalta [Duloxetine Hcl]    Droperidol     REACTION: hives   Flexeril [Cyclobenzaprine Hcl]    Fluconazole Nausea And Vomiting and Other (See Comments)    fatigue   Formoterol Itching   Gabapentin Itching   Ketorolac Tromethamine     REACTION: hives   Ketorolac Tromethamine    Metoclopramide Hcl    Mometasone Furo-Formoterol Fum     Causes sore throat, blurred vision and unable to sleep--started on med 09-17-10   Mometasone Furoate Itching   Morphine     REACTION: hives and itching   Olmesartan Medoxomil     REACTION: fatique   Breo Ellipta [Fluticasone Furoate-Vilanterol] Itching    Pt reports itching with Virgel Bouquet is related to being lactose intolerant.    Chief Complaint  Patient presents with   Medical Management of Chronic Issues    4 month follow up.   HPI: Patient is an 83 y.o.  female who comes in today for her chronic management appointment. She is feeling congested since this morning and complaining of having a congested cough and that she did not use her AM Trelegy Ellipta inhaler this morning, which she states normally helps her breathe better. She states she has a history of chronic allergic bronchitis.   She denies any sick contacts recently and that her appetite has been normal, although she does complain of intermittent indigestion. She attests to eating pasta and tomatoes. She takes Nexium in the AM and it helps her acid reflux. She uses her cane at home and the rollator out of home. She uses public transportation to commute, including to her health appointments.  She had 2 cortisone shots 2 weeks ago in both knees which she states helped a bit with her pain but not much. She states she is not a candidate to have surgery to her knees due to her CHF.  Her cardiologist is Dr. Clifton James. She will see him again in about 6 months.  She has no acute complaints today. She denies any headaches, fever, sore throat, chills, constipation, diarrhea, shortness of breath, dizziness, chest pain, nausea, and vomiting.  She states her son got her a recliner for christmas and she is elevating her legs more frequently which made the swelling in her legs improve.   Review of Systems:  Review of Systems  Constitutional: Negative.   HENT:  Positive  for congestion. Negative for sore throat.   Eyes: Negative.   Respiratory:  Positive for cough and wheezing. Negative for shortness of breath.   Cardiovascular:  Positive for leg swelling. Negative for chest pain, palpitations and orthopnea.  Gastrointestinal:  Positive for heartburn. Negative for abdominal pain, constipation, diarrhea, nausea and vomiting.  Genitourinary:  Positive for frequency.  Musculoskeletal:  Positive for joint pain.  Skin: Negative.   Neurological: Negative.   Endo/Heme/Allergies: Negative.    Psychiatric/Behavioral: Negative.     Past Medical History:  Diagnosis Date   (HFpEF) heart failure with preserved ejection fraction (HCC) 12/26/2021   Echocardiogram 12/2021: EF 55-60, no RWMA, Gr 2 DD, low normal RVSF, mildly elevated PASP (RVSP 38.5), mild LAE, trivial MR   ALLERGIC RHINITIS    Anxiety    Asthma    Blood type O+    Cervical strain, acute    Cirrhosis (HCC)    Colon polyp 2010   adenoma   Diverticulosis of colon    Dizziness    Esophageal varices (HCC)    Fibromyalgia    GERD (gastroesophageal reflux disease)    History of nephrolithiasis    Hypercholesterolemia    borderline   Hypertension    IBS (irritable bowel syndrome)    Osteoarthritis    Personal history of allergy to unspecified medicinal agent    Postconcussion syndrome    Vitamin D deficiency    Past Surgical History:  Procedure Laterality Date   CHOLECYSTECTOMY, LAPAROSCOPIC  2004   for gallstone pancreatitis   KNEE SURGERY Right    KNEE SURGERY Left    THYROID SURGERY     cyst removed   Social History:   reports that she quit smoking about 61 years ago. Her smoking use included cigarettes. She started smoking about 69 years ago. She has a 16 pack-year smoking history. She has never used smokeless tobacco. She reports that she does not drink alcohol and does not use drugs.  Family History  Problem Relation Age of Onset   Breast cancer Mother    Alzheimer's disease Mother    Ovarian cancer Sister    Heart disease Sister    Breast cancer Sister 22   Alcohol abuse Sister    COPD Brother    Alcohol abuse Brother    Ovarian cancer Paternal Grandmother    Heart attack Maternal Uncle    COPD Cousin        Maternal side    Arthritis Other        Cousins    Colon cancer Neg Hx    Esophageal cancer Neg Hx    Medications: Patient's Medications  New Prescriptions   No medications on file  Previous Medications   ACETAMINOPHEN (TYLENOL) 500 MG TABLET    Take 1,000 mg by mouth as needed.    ALBUTEROL (PROAIR HFA) 108 (90 BASE) MCG/ACT INHALER    INHALE 2 PUFFS EVERY 4 HOURS AS NEEDED INTO THE LUNGS FOR WHEEZING OR SHORTNESS OF BREATH J45.20   AMLODIPINE (NORVASC) 5 MG TABLET    TAKE 1 TABLET (5 MG TOTAL) BY MOUTH DAILY.   CETIRIZINE (ZYRTEC) 10 MG TABLET    Take 10 mg by mouth daily.   CHOLECALCIFEROL 50 MCG (2000 UT) CAPS    Take 2,000 Units by mouth in the morning and at bedtime.   CYANOCOBALAMIN (VITAMIN B12) 1000 MCG TABLET    Take 1 tablet (1,000 mcg total) by mouth daily.   ELIQUIS 5 MG TABS TABLET  TAKE 1 TABLET BY MOUTH TWICE A DAY   ESOMEPRAZOLE (NEXIUM) 20 MG CAPSULE    TAKE 1 CAPSULE BY MOUTH EVERY DAY   EZETIMIBE (ZETIA) 10 MG TABLET    TAKE 1 TABLET BY MOUTH EVERY DAY   FUROSEMIDE (LASIX) 20 MG TABLET    TAKE 1 TABLET BY MOUTH EVERY DAY   LOSARTAN (COZAAR) 25 MG TABLET    TAKE 1 TABLET BY MOUTH EVERY DAY   MECLIZINE (ANTIVERT) 12.5 MG TABLET    Take 1 tablet (12.5 mg total) by mouth 3 (three) times daily as needed for dizziness.   OMEGA-3 FATTY ACIDS (FISH OIL MAXIMUM STRENGTH) 1200 MG CAPS    Take 1,200 mg by mouth 2 (two) times daily.    PROPRANOLOL (INDERAL) 10 MG TABLET    TAKE 1 TABLET BY MOUTH TWICE A DAY   PROPYLENE GLYCOL (SYSTANE BALANCE) 0.6 % SOLN    Place 2 drops into both eyes 2 (two) times daily.   SODIUM CHLORIDE (OCEAN) 0.65 % NASAL SPRAY    Place 1 spray into the nose daily.   TIZANIDINE (ZANAFLEX) 4 MG TABLET    Take 4 mg by mouth at bedtime.   TRELEGY ELLIPTA 100-62.5-25 MCG/ACT AEPB    TAKE 1 PUFF BY MOUTH EVERY DAY  Modified Medications   No medications on file  Discontinued Medications   No medications on file   Physical Exam:  Vitals:   06/06/23 0951  BP: 126/72  Pulse: 61  Resp: 18  Temp: 97.6 F (36.4 C)  SpO2: 93%  Weight: 184 lb 6.4 oz (83.6 kg)  Height: 5\' 3"  (1.6 m)   Body mass index is 32.66 kg/m. Wt Readings from Last 3 Encounters:  06/06/23 184 lb 6.4 oz (83.6 kg)  04/25/23 184 lb 12.8 oz (83.8 kg)  03/11/23 187  lb 9.6 oz (85.1 kg)   Physical Exam Vitals reviewed.  Constitutional:      Appearance: Normal appearance.  HENT:     Head: Normocephalic and atraumatic.     Right Ear: External ear normal.     Left Ear: External ear normal.     Nose: Congestion present.  Cardiovascular:     Rate and Rhythm: Normal rate and regular rhythm.     Pulses: Normal pulses.  Pulmonary:     Effort: Pulmonary effort is normal.     Breath sounds: Examination of the right-upper field reveals wheezing. Examination of the left-upper field reveals wheezing. Wheezing present.  Abdominal:     Palpations: Abdomen is soft.     Tenderness: There is no abdominal tenderness.  Musculoskeletal:        General: No swelling.     Cervical back: Neck supple.     Right lower leg: 1+ Edema present.     Left lower leg: 1+ Edema present.  Skin:    General: Skin is warm and dry.  Neurological:     Mental Status: She is alert and oriented to person, place, and time.  Psychiatric:        Mood and Affect: Mood normal.        Behavior: Behavior normal.   Labs reviewed: Basic Metabolic Panel: Recent Labs    08/12/22 1121 09/06/22 1534 01/31/23 1019  NA 142 139 141  K 4.8 3.9 5.2  CL 105 100 105  CO2 28 29 29   GLUCOSE 104* 101* 102*  BUN 20 21 21   CREATININE 0.63 0.76 0.62  CALCIUM 10.0 9.9 9.9  TSH  --   --  2.40   Liver Function Tests: Recent Labs    08/12/22 1121 09/06/22 1534 01/31/23 1019  AST 22 21 22   ALT 15 15 19   BILITOT 0.7 1.0 0.6  PROT 6.7 6.9 6.4   Recent Labs    08/12/22 1121 09/06/22 1534  AMYLASE 133* 102*   No results for input(s): "AMMONIA" in the last 8760 hours. CBC: Recent Labs    09/06/22 1534 01/31/23 1019  WBC 9.3 6.7  NEUTROABS 4,418 2,760  HGB 14.8 14.2  HCT 44.4 43.1  MCV 89.0 88.9  PLT 334 278   Lipid Panel: Recent Labs    01/31/23 1019  CHOL 178  HDL 66  LDLCALC 93  TRIG 96  CHOLHDL 2.7   TSH: Recent Labs    01/31/23 1019  TSH 2.40   A1C: Lab  Results  Component Value Date   HGBA1C 6.2 (H) 01/31/2023   Assessment/Plan  1. Essential hypertension (Primary) Controlled- -BP 126/72 today -Continue amlodipine, losartan, propranolol and furosemide as prescribed -Check CMP at next visti  2. Allergic rhinitis, unspecified seasonality, unspecified trigger Overall stable -Continue taking cetirizine daily -Continue using saline nasal spray daily  4. Primary osteoarthritis of both knees -ongoing and stable.  -Continue using fall precautions -Continue taking tizanidine and tylenol as needed for pain relief  5. Gastroesophageal reflux disease without esophagitis Overall controlled. -Continue taking Nexium every morning -Avoid acidic food as much as possible -Do not lay down after eating  6. Generalized weakness -stable -Continue using cane and rollator for safe ambulation -Check CBC and CMP at next visit  7. Chronic heart failure with preserved ejection fraction (HCC) -euvolemic -continue to decrease/limit sodium intake -Keep follow up with cardiologist -Check CMP at next visit -Continue lasix as prescribed  8. Hyperglycemia Continue dietary modifications, stable A1c at this time -Hgb A1C 6.2 on 01/31/23 -Recheck A1C at next visit -Continue limiting carbohydrates in diet  9. DNR (do not resuscitate) -DNR form available in chart 08/15/2017  10. Chronic obstructive pulmonary disease, unspecified COPD type (HCC) -with more congestion and wheezing this am however has not taken her trelegy -denies worsening shortness of breath or increase in cough -will start mucinex PO BID at this time -Continue Trelegy Ellipta as prescribed   Return in about 4 months (around 10/04/2023) for routine follow up, labs at appt .: Follow up in 4 months for routine visit with labs (including CBC, CMP, and A1C).  Lenord Fellers, RN DNP-AGPCNP Student I personally was present during the history, physical exam and medical decision-making activities  of this service and have verified that the service and findings are accurately documented in the student's note Ezrael Sam K. Biagio Borg [facility] [phone]

## 2023-06-08 ENCOUNTER — Other Ambulatory Visit: Payer: Self-pay | Admitting: Pulmonary Disease

## 2023-06-27 ENCOUNTER — Other Ambulatory Visit: Payer: Self-pay

## 2023-06-27 ENCOUNTER — Telehealth: Payer: Self-pay

## 2023-06-27 DIAGNOSIS — M545 Low back pain, unspecified: Secondary | ICD-10-CM

## 2023-06-27 NOTE — Telephone Encounter (Signed)
Called and made patient aware.

## 2023-06-27 NOTE — Telephone Encounter (Signed)
Patient called in an states she was feeling a little stuffy yesterday but she has taken the Mucinex and feels better. She states that she was told to stop taking vitamins by you and wanted to know is vitamin D is okay for her to take

## 2023-06-27 NOTE — Telephone Encounter (Signed)
Yes vit d is okay for her to take 1000-2000 units daily

## 2023-06-28 ENCOUNTER — Other Ambulatory Visit: Payer: Self-pay | Admitting: Pulmonary Disease

## 2023-06-30 ENCOUNTER — Other Ambulatory Visit: Payer: Self-pay | Admitting: Physician Assistant

## 2023-07-01 ENCOUNTER — Telehealth: Payer: Self-pay

## 2023-07-01 MED ORDER — TRELEGY ELLIPTA 100-62.5-25 MCG/ACT IN AEPB: 1.0000 | INHALATION_SPRAY | Freq: Every day | RESPIRATORY_TRACT | 3 refills | Status: AC

## 2023-07-01 NOTE — Telephone Encounter (Signed)
Patient called to say she is about to run out of her trelegy inhaler and the pharmacy states they are having trouble getting a response from her lung specialist.   Patient called asking if Carla Seller, NP would be willing to approve refill or communicate with lung specialist. Patient states with the bad weather coming she will not be able to go out an pick up prescrption and needs this addressed today if possible

## 2023-07-01 NOTE — Telephone Encounter (Signed)
I called and discussed with patient Refill for Trelegy sent to her pharmacy She is a former Dr. Kendrick Fries patient.  Can you make a follow-up appointment as a new patient with any of the doctors in office.  Thank you

## 2023-07-04 ENCOUNTER — Other Ambulatory Visit: Payer: Self-pay | Admitting: Nurse Practitioner

## 2023-07-04 DIAGNOSIS — R809 Proteinuria, unspecified: Secondary | ICD-10-CM

## 2023-07-04 DIAGNOSIS — R7303 Prediabetes: Secondary | ICD-10-CM

## 2023-07-04 DIAGNOSIS — I10 Essential (primary) hypertension: Secondary | ICD-10-CM

## 2023-07-08 NOTE — Telephone Encounter (Signed)
 Pharmacy requested refill.  Pended and sent to [facility] for approval due to HIGH ALERT Warning.

## 2023-07-11 DIAGNOSIS — M79675 Pain in left toe(s): Secondary | ICD-10-CM | POA: Diagnosis not present

## 2023-07-23 ENCOUNTER — Ambulatory Visit: Payer: PPO | Admitting: Internal Medicine

## 2023-08-21 ENCOUNTER — Other Ambulatory Visit: Payer: PPO

## 2023-09-04 ENCOUNTER — Other Ambulatory Visit: Payer: Self-pay | Admitting: Nurse Practitioner

## 2023-09-08 ENCOUNTER — Ambulatory Visit: Payer: Self-pay

## 2023-09-08 NOTE — Telephone Encounter (Signed)
 Copied from CRM [id number]. Topic: Clinical - Red Word Triage >> Sep 08, 2023 12:49 PM Tiffany H wrote: Red Word that prompted transfer to Nurse Triage: Patient called to advise that she might have shingles under both breasts. She called the after hours line last night. She's been using vaseline on it but she advised that she also had allergy  attack last week and she's been treating that. She's suspects they may be releated. She advised that she has moments of terrible vertigo.   Due to age and because she engaged the after hours line last night, sending Red Word Triage.   Chief Complaint: Rash Symptoms:  Rash under bilateral breasts  Frequency: Constant  Pertinent Negatives: Patient denies fever Disposition: [] ED /[] Urgent Care (no appt availability in office) / [x] Appointment(In office/virtual)/ []  [facility]/ [] Home Care/ [] Refused Recommended Disposition /[] [facility]/ []  Follow-up with PCP Additional Notes: Patient reports that 1 week ago she had an allergy  attack with some dizziness which is greatly improved. She states that at that time she noticed a rash under her bilateral breasts. She states that the rash it itchy and painful. She states she has been applying Vaseline without relief. Appointment made for the patient on 4/30 for evaluation and treatment.      Reason for Disposition  Localized rash present > 7 days  Answer Assessment - Initial Assessment Questions 1. APPEARANCE of RASH: "Describe the rash."      Red circle under both breasts  2. LOCATION: "Where is the rash located?"      Under both breasts  3. NUMBER: "How many spots are there?"      One under each breast  4. SIZE: "How big are the spots?" (Inches, centimeters or compare to size of a coin)      Large 5. ONSET: "When did the rash start?"      1 week ago  6. ITCHING: "Does the rash itch?" If Yes, ask: "How bad is the itch?"  (Scale 0-10; or none, mild, moderate, severe)     Mild to moderate   7. PAIN: "Does the rash hurt?" If Yes, ask: "How bad is the pain?"  (Scale 0-10; or none, mild, moderate, severe)    - NONE (0): no pain    - MILD (1-3): doesn't interfere with normal activities     - MODERATE (4-7): interferes with normal activities or awakens from sleep     - SEVERE (8-10): excruciating pain, unable to do any normal activities     6/10 8. OTHER SYMPTOMS: "Do you have any other symptoms?" (e.g., fever)     Some allergies and dizziness, which are improved  Protocols used: Rash or Redness - Localized-A-AH

## 2023-09-08 NOTE — Telephone Encounter (Signed)
 Message sent to [facility] as FYI

## 2023-09-10 ENCOUNTER — Ambulatory Visit (INDEPENDENT_AMBULATORY_CARE_PROVIDER_SITE_OTHER): Admitting: Adult Health

## 2023-09-10 ENCOUNTER — Encounter: Payer: Self-pay | Admitting: Adult Health

## 2023-09-10 VITALS — BP 118/74 | HR 67 | Temp 97.6°F | Ht 63.0 in | Wt 187.0 lb

## 2023-09-10 DIAGNOSIS — J4520 Mild intermittent asthma, uncomplicated: Secondary | ICD-10-CM

## 2023-09-10 DIAGNOSIS — I480 Paroxysmal atrial fibrillation: Secondary | ICD-10-CM

## 2023-09-10 DIAGNOSIS — R7303 Prediabetes: Secondary | ICD-10-CM | POA: Diagnosis not present

## 2023-09-10 DIAGNOSIS — B372 Candidiasis of skin and nail: Secondary | ICD-10-CM

## 2023-09-10 DIAGNOSIS — I10 Essential (primary) hypertension: Secondary | ICD-10-CM | POA: Diagnosis not present

## 2023-09-10 DIAGNOSIS — I5032 Chronic diastolic (congestive) heart failure: Secondary | ICD-10-CM | POA: Diagnosis not present

## 2023-09-10 MED ORDER — NYSTATIN 100000 UNIT/GM EX OINT: 1.0000 | TOPICAL_OINTMENT | Freq: Two times a day (BID) | CUTANEOUS | 0 refills | Status: DC

## 2023-09-10 NOTE — Progress Notes (Addendum)
 [facility] clinic  Provider:  Inge Mangle DNP  Code Status:  DNR  Goals of Care:     06/06/2023    9:48 AM  Advanced Directives  Does Patient Have a Medical Advance Directive? Yes  Type of Advance Directive Out of facility DNR (pink MOST or yellow form)  Does patient want to make changes to medical advance directive? No - Patient declined  Pre-existing out of facility DNR order (yellow form or pink MOST form) Yellow form placed in chart (order not valid for inpatient use);Pink MOST form placed in chart (order not valid for inpatient use)     Chief Complaint  Patient presents with   Rash    Rash under breast , painful itchy   think it might shingles notice about 1 week ago ,using cream,   just under breast on upper chest, has issues with allergy      Discussed the use of AI scribe software for clinical note transcription with the patient, who gave verbal consent to proceed.  HPI: Patient is a 83 y.o. female seen today for an acute visit for rash.  She has a painful and itchy rash under both breasts that has been present for about a week. The rash is erythematous, burning, and itchy. She has a history of dry skin and has tried using lotion and Vaseline without relief. Initially, she suspected shingles but was informed that shingles typically affects only one side of the body. No rashes are present on her stomach.  She has a history of hypertension and is currently taking amlodipine  5 mg daily, losartan  25 mg daily, and propranolol  10 mg twice a day. BP today 118/74. She also has atrial fibrillation and is on Eliquis  5 mg twice a day for anticoagulation. She has chronic heart failure and takes Lasix  20 mg daily to manage fluid retention.  She has a history of chronic bronchitis and asthma, which is triggered by bronchitis. She uses Trelegy inhaler once daily and has an albuterol  inhaler for as-needed use. Since starting Trelegy, her symptoms have improved significantly, and she  rarely needs to use albuterol .  She is prediabetic with an A1c of 6.2 as of September 2024. She has a fondness for sweets and needs to watch her diet, particularly her intake of carbohydrates.  She experiences pain due to bone-on-bone arthritis and neuropathy in her feet, which makes walking difficult. She uses a cane and is considering a wheelchair to aid mobility. She lives in a senior apartment complex and her son is currently staying with her to provide support.   Past Medical History:  Diagnosis Date   (HFpEF) heart failure with preserved ejection fraction (HCC) 12/26/2021   Echocardiogram 12/2021: EF 55-60, no RWMA, Gr 2 DD, low normal RVSF, mildly elevated PASP (RVSP 38.5), mild LAE, trivial MR   ALLERGIC RHINITIS    Anxiety    Asthma    Blood type O+    Cervical strain, acute    Cirrhosis (HCC)    Colon polyp 2010   adenoma   Diverticulosis of colon    Dizziness    Esophageal varices (HCC)    Fibromyalgia    GERD (gastroesophageal reflux disease)    History of nephrolithiasis    Hypercholesterolemia    borderline   Hypertension    IBS (irritable bowel syndrome)    Osteoarthritis    Personal history of allergy  to unspecified medicinal agent    Postconcussion syndrome    Vitamin D  deficiency     Past  Surgical History:  Procedure Laterality Date   CHOLECYSTECTOMY, LAPAROSCOPIC  2004   for gallstone pancreatitis   KNEE SURGERY Right    KNEE SURGERY Left    THYROID  SURGERY     cyst removed    Allergies  Allergen Reactions   Wixela Inhub [Fluticasone -Salmeterol] Shortness Of Breath   Ace Inhibitors     cough   Amoxicillin Itching    REACTION: unknown? pain in right kidney   Benicar Hct [Olmesartan Medoxomil-Hctz]     Extreme weakness   Budesonide -Formoterol  Fumarate     Causes tremors and numbness   Chlorzoxazone Hives   Chlorzoxazone [Chlorzoxazone]    Citalopram Hydrobromide     REACTION: hives   Citalopram Hydrobromide    Clonidine Derivatives     Cymbalta  [Duloxetine  Hcl]    Droperidol     REACTION: hives   Flexeril [Cyclobenzaprine Hcl]    Fluconazole  Nausea And Vomiting and Other (See Comments)    fatigue   Formoterol  Itching   Gabapentin  Itching   Ketorolac  Tromethamine      REACTION: hives   Ketorolac  Tromethamine     Metoclopramide Hcl    Mometasone  Furo-Formoterol  Fum     Causes sore throat, blurred vision and unable to sleep--started on med 09-17-10   Mometasone  Furoate Itching   Morphine     REACTION: hives and itching   Olmesartan Medoxomil     REACTION: fatique   Breo Ellipta  [Fluticasone  Furoate-Vilanterol] Itching    Pt reports itching with Breo is related to being lactose intolerant.     Outpatient Encounter Medications as of 09/10/2023  Medication Sig   acetaminophen  (TYLENOL ) 500 MG tablet Take 1,000 mg by mouth as needed.   albuterol  (PROAIR  HFA) 108 (90 Base) MCG/ACT inhaler INHALE 2 PUFFS EVERY 4 HOURS AS NEEDED INTO THE LUNGS FOR WHEEZING OR SHORTNESS OF BREATH J45.20   amLODipine  (NORVASC ) 5 MG tablet TAKE 1 TABLET (5 MG TOTAL) BY MOUTH DAILY.   cetirizine (ZYRTEC) 10 MG tablet Take 10 mg by mouth daily.   Cholecalciferol 50 MCG (2000 UT) CAPS Take 2,000 Units by mouth in the morning and at bedtime.   cyanocobalamin  (VITAMIN B12) 1000 MCG tablet Take 1 tablet (1,000 mcg total) by mouth daily.   ELIQUIS  5 MG TABS tablet TAKE 1 TABLET BY MOUTH TWICE A DAY   esomeprazole  (NEXIUM ) 20 MG capsule TAKE 1 CAPSULE BY MOUTH EVERY DAY   ezetimibe  (ZETIA ) 10 MG tablet TAKE 1 TABLET BY MOUTH EVERY DAY   Fluticasone -Umeclidin-Vilant (TRELEGY ELLIPTA ) 100-62.5-25 MCG/ACT AEPB Inhale 1 puff into the lungs daily.   furosemide  (LASIX ) 20 MG tablet TAKE 1 TABLET BY MOUTH EVERY DAY   losartan  (COZAAR ) 25 MG tablet TAKE 1 TABLET BY MOUTH EVERY DAY   meclizine  (ANTIVERT ) 12.5 MG tablet TAKE 1 TABLET BY MOUTH 3 TIMES DAILY AS NEEDED FOR DIZZINESS.   nystatin ointment (MYCOSTATIN) Apply 1 Application topically 2 (two) times daily  for 14 days.   Omega-3 Fatty Acids (FISH OIL MAXIMUM STRENGTH) 1200 MG CAPS Take 1,200 mg by mouth 2 (two) times daily.    propranolol  (INDERAL ) 10 MG tablet TAKE 1 TABLET BY MOUTH TWICE A DAY   Propylene Glycol (SYSTANE BALANCE) 0.6 % SOLN Place 2 drops into both eyes 2 (two) times daily.   sodium chloride  (OCEAN) 0.65 % nasal spray Place 1 spray into the nose daily.   TRELEGY ELLIPTA  100-62.5-25 MCG/ACT AEPB INHALE 1 PUFF BY MOUTH EVERY DAY   tiZANidine (ZANAFLEX) 4 MG tablet Take 4 mg by  mouth at bedtime. (Patient not taking: Reported on 09/10/2023)   No facility-administered encounter medications on file as of 09/10/2023.    Review of Systems:  Review of Systems  Constitutional:  Negative for appetite change, chills, fatigue and fever.  HENT:  Negative for congestion, hearing loss, rhinorrhea and sore throat.   Eyes: Negative.   Respiratory:  Negative for cough, shortness of breath and wheezing.   Cardiovascular:  Negative for chest pain, palpitations and leg swelling.  Gastrointestinal:  Negative for abdominal pain, constipation, diarrhea, nausea and vomiting.  Genitourinary:  Negative for dysuria.  Musculoskeletal:  Negative for arthralgias, back pain and myalgias.  Skin:  Positive for rash. Negative for color change and wound.  Neurological:  Negative for dizziness, weakness and headaches.  Psychiatric/Behavioral:  Negative for behavioral problems. The patient is not nervous/anxious.     Health Maintenance  Topic Date Due   COVID-19 Vaccine (7 - 2024-25 season) 08/29/2023   DTaP/Tdap/Td (2 - Td or Tdap) 12/07/2023   INFLUENZA VACCINE  12/12/2023   Medicare Annual Wellness (AWV)  01/31/2024   Pneumonia Vaccine 41+ Years old  Completed   DEXA SCAN  Completed   Zoster Vaccines- Shingrix  Completed   HPV VACCINES  Aged Out   Meningococcal B Vaccine  Aged Out   Colonoscopy  Discontinued    Physical Exam: Vitals:   09/10/23 1028  BP: 118/74  Pulse: 67  Temp: 97.6 F (36.4  C)  SpO2: 97%  Weight: 187 lb (84.8 kg)  Height: 5\' 3"  (1.6 m)   Body mass index is 33.13 kg/m. Physical Exam Constitutional:      Appearance: She is obese.  HENT:     Head: Normocephalic and atraumatic.     Nose: Nose normal.     Mouth/Throat:     Mouth: Mucous membranes are moist.  Eyes:     Conjunctiva/sclera: Conjunctivae normal.  Cardiovascular:     Rate and Rhythm: Normal rate and regular rhythm.  Pulmonary:     Effort: Pulmonary effort is normal.     Breath sounds: Normal breath sounds.  Abdominal:     General: Bowel sounds are normal.     Palpations: Abdomen is soft.  Musculoskeletal:        General: Normal range of motion.     Cervical back: Normal range of motion.  Skin:    General: Skin is warm and dry.     Findings: Rash present.     Comments: Erythematous rashes under bilateral breasts  Neurological:     General: No focal deficit present.     Mental Status: She is alert and oriented to person, place, and time.  Psychiatric:        Mood and Affect: Mood normal.        Behavior: Behavior normal.        Thought Content: Thought content normal.        Judgment: Judgment normal.     Labs reviewed: Basic Metabolic Panel: Recent Labs    01/31/23 1019  NA 141  K 5.2  CL 105  CO2 29  GLUCOSE 102*  BUN 21  CREATININE 0.62  CALCIUM  9.9  TSH 2.40   Liver Function Tests: Recent Labs    01/31/23 1019  AST 22  ALT 19  BILITOT 0.6  PROT 6.4   No results for input(s): "LIPASE", "AMYLASE" in the last 8760 hours. No results for input(s): "AMMONIA" in the last 8760 hours. CBC: Recent Labs    01/31/23 1019  WBC 6.7  NEUTROABS 2,760  HGB 14.2  HCT 43.1  MCV 88.9  PLT 278   Lipid Panel: Recent Labs    01/31/23 1019  CHOL 178  HDL 66  LDLCALC 93  TRIG 96  CHOLHDL 2.7   Lab Results  Component Value Date   HGBA1C 6.2 (H) 01/31/2023    Procedures since last visit: No results found.  Assessment/Plan  1. Candidal skin infection  (Primary)  -  Erythematous, painful, pruritic rash under breasts likely due to Candida. Ruled out shingles. Discussed fungal management. - Prescribe nystatin ointment. Apply twice daily after cleaning for two weeks. - nystatin ointment (MYCOSTATIN); Apply 1 Application topically 2 (two) times daily for 14 days.  Dispense: 28 g; Refill: 0  2. Paroxysmal atrial fibrillation (HCC) -  rate-controlled -  Managed with Eliquis  5 mg twice daily for anticoagulation.  3. Mild intermittent asthmatic bronchitis without complication -  Well-managed with Trelegy inhaler and albuterol  as needed. Reports significant improvement.  4. Essential hypertension -  Blood pressure controlled with amlodipine , losartan , and propranolol . Compliant with regimen.  5. Chronic heart failure with preserved ejection fraction (HCC) -  Chronic condition managed with Lasix  20 mg daily. No current dyspnea. Emphasized fluid management - For home use only DME Other see comment for wheelchair -  she has shortness of breath when walking long distance  6. Prediabetes Lab Results  Component Value Date   HGBA1C 6.2 (H) 01/31/2023    -  diet-controlled      Labs/tests ordered:   None   Return if symptoms worsen or fail to improve.  Annalese Stiner Medina-Vargas, NP

## 2023-09-17 ENCOUNTER — Other Ambulatory Visit: Payer: Self-pay | Admitting: Nurse Practitioner

## 2023-09-17 ENCOUNTER — Other Ambulatory Visit: Payer: Self-pay | Admitting: Cardiovascular Disease

## 2023-09-17 ENCOUNTER — Other Ambulatory Visit: Payer: Self-pay | Admitting: Adult Health

## 2023-09-17 DIAGNOSIS — K219 Gastro-esophageal reflux disease without esophagitis: Secondary | ICD-10-CM

## 2023-09-17 DIAGNOSIS — B372 Candidiasis of skin and nail: Secondary | ICD-10-CM

## 2023-09-17 DIAGNOSIS — E782 Mixed hyperlipidemia: Secondary | ICD-10-CM

## 2023-09-17 DIAGNOSIS — I480 Paroxysmal atrial fibrillation: Secondary | ICD-10-CM

## 2023-09-17 NOTE — Telephone Encounter (Signed)
 Prescription refill request for Eliquis  received. Indication:afib Last office visit:7/24 Scr:0.62  9/24 Age: 83 Weight:84.8  kg  Prescription refilled

## 2023-09-23 DIAGNOSIS — G629 Polyneuropathy, unspecified: Secondary | ICD-10-CM | POA: Diagnosis not present

## 2023-09-23 DIAGNOSIS — I5032 Chronic diastolic (congestive) heart failure: Secondary | ICD-10-CM | POA: Diagnosis not present

## 2023-09-24 ENCOUNTER — Encounter: Admitting: Internal Medicine

## 2023-09-25 ENCOUNTER — Encounter: Payer: Self-pay | Admitting: Primary Care

## 2023-09-25 ENCOUNTER — Ambulatory Visit: Admitting: Primary Care

## 2023-09-25 VITALS — BP 122/60 | HR 70 | Temp 98.5°F | Ht 61.0 in | Wt 186.4 lb

## 2023-09-25 DIAGNOSIS — J42 Unspecified chronic bronchitis: Secondary | ICD-10-CM | POA: Diagnosis not present

## 2023-09-25 DIAGNOSIS — Z87891 Personal history of nicotine dependence: Secondary | ICD-10-CM | POA: Diagnosis not present

## 2023-09-25 MED ORDER — TRELEGY ELLIPTA 100-62.5-25 MCG/ACT IN AEPB: 1.0000 | INHALATION_SPRAY | Freq: Every day | RESPIRATORY_TRACT | 11 refills | Status: DC

## 2023-09-25 NOTE — Patient Instructions (Addendum)
  VISIT SUMMARY: You had a follow-up appointment to review your chronic bronchitis and chronic heart failure. Your chronic bronchitis is well-managed with Trelegy, and you have not had any recent flare-ups. Your chronic heart failure continues to limit some physical activities, but your energy levels have improved with Vitamin B12 supplementation.  YOUR PLAN: -CHRONIC BRONCHITIS: Chronic bronchitis is a long-term inflammation of the airways in the lungs. Your condition is well-managed with Trelegy, which you should continue to use daily. You have not had any recent flare-ups, and your symptoms are minimal with a slight dry cough occurring twice a day. We will refill your Trelegy inhaler for daily use.  INSTRUCTIONS: Continue using Trelegy inhaler daily for your chronic bronchitis. Maintain your current management strategies for chronic heart failure, including the use of a shower chair and laundry cart. Keep taking Vitamin B12 supplements to help with your energy levels. Follow up with us  in one year or sooner if you experience any issues.  Follow-up  1 year with [facility] NP or sooner if needed

## 2023-09-25 NOTE — Progress Notes (Signed)
 @Patient  ID: Derenda Flax, female    DOB: Apr 26, 1941, 83 y.o.   MRN: [id number]  No chief complaint on file.   Referring provider: Verma Gobble, NP  HPI: 83 year old female, former smoker. PMH significant for afib, heart failure, esophageal varices, HTN, chronic bronchitis, GERD, liver cirrhosis, scoliosis, fibromyalgia, dyslipidemia, vit D deficiency, obesity.   Previous [facility] pulmonary encounter: 04/19/22- Dr. McQuaid Jessyka is here to see me for COPD: > when she was 11 she had pneumonia and has had allergies a lot over the years > she says that she had life long asthma problems and if she had allergy  attacks she would get short of breath > she has been using Advair high dose for a long time and felt that it helped > her insurance company is not going to cover advair any more > when she was in her 30's she said she enjoyed a long period of no difficulty breathing > however later in life she developed worsening dyspnea with periodic attacks > she says that this year has been a bad year with frequent allergy  attacks > she is dealing with much more dyspnea this year, she feels like she can't take a deep breath > she has a mild headache a lot > talking makes her breathing worse > she can walk about 1/2-1 block before she gets short of breath > she can't climb stairs due to knee problems > she uses albuterol  but it doesn't help as much as she would like > she feels like Advair helps more than albuterol   Smoked briefly 2 packs a day for 8 years  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  November 2023 record with primary care reviewed where the patient was seen in the context of COPD and treated with DuoNeb   05/28/22- Dr. Dottie Gearing says that her breathing has improved significantly since starting the Trelegy.  Breathing is improved a lot.   Minimal cough.  She's only needed to use albuterol  nebulized 3 times since the last few weeks.  She says that the Trelegy is only costing her $11/months.  09/25/2023- Interim hx  .Discussed the use of AI scribe software for clinical note transcription with the patient, who gave verbal consent to proceed.  History of Present Illness   MANAMI LAMIA is an 83 year old female with chronic bronchitis who presents for a one-year follow-up.  Referred in December 2023 for chronic bronchitis Normal PFT, significant improvement with Trelegy Minimal smoking history  She has a long-standing history of asthmatic bronchitis and allergies, she had pneumonia when she was age 72. Since starting Trelegy, which she uses daily every morning, there is significant improvement in her symptoms. She rarely needs to use her breathing machine or albuterol  inhaler anymore. Her cough is slight, occurring about twice a day, and is dry without mucus production. Occasionally, she experiences chest tightness and phlegm in her chest. No recent bronchitis flare-ups requiring antibiotics since starting Trelegy. Her last chest x-ray was performed in 2023 which showed no acute cardiopulmonary disease.  She also has chronic heart failure, which limits her ability to walk up inclines due to shortness of breath. She manages daily activities such as laundry and showering with the aid of a shower chair and a cart for her laundry. She tells me that her sleep  is poor, attributed to watching TV late, but it is not related to breathing difficulties. She reports improved energy levels since starting Vitamin B12 and fish oil supplements, noting that previously she had difficulty getting out of her chair due to low energy. No recent antibiotic use or prednisone . She has not needed to use her nebulizer recently. No issues affording inhaler. No longer driving, getting transportation through [employer].      Allergies  Allergen Reactions    Wixela Inhub [Fluticasone -Salmeterol] Shortness Of Breath   Ace Inhibitors     cough   Amoxicillin Itching    REACTION: unknown? pain in right kidney   Benicar Hct [Olmesartan Medoxomil-Hctz]     Extreme weakness   Budesonide -Formoterol  Fumarate     Causes tremors and numbness   Chlorzoxazone Hives   Chlorzoxazone [Chlorzoxazone]    Citalopram Hydrobromide     REACTION: hives   Citalopram Hydrobromide    Clonidine Derivatives    Cymbalta  [Duloxetine  Hcl]    Droperidol     REACTION: hives   Flexeril [Cyclobenzaprine Hcl]    Fluconazole  Nausea And Vomiting and Other (See Comments)    fatigue   Formoterol  Itching   Gabapentin  Itching   Ketorolac  Tromethamine      REACTION: hives   Ketorolac  Tromethamine     Metoclopramide Hcl    Mometasone  Furo-Formoterol  Fum     Causes sore throat, blurred vision and unable to sleep--started on med 09-17-10   Mometasone  Furoate Itching   Morphine     REACTION: hives and itching   Olmesartan Medoxomil     REACTION: fatique   Breo Ellipta  [Fluticasone  Furoate-Vilanterol] Itching    Pt reports itching with Breo is related to being lactose intolerant.     Immunization History  Administered Date(s) Administered   Fluad Quad(high Dose 65+) 02/27/2021, 02/04/2022   Fluad Trivalent(High Dose 65+) 01/31/2023   Hepatitis A, Adult 10/15/2017, 04/17/2018   Influenza Split 02/11/2011, 02/25/2012   Influenza Whole 02/10/2009, 02/26/2010   Influenza, High Dose Seasonal PF 04/24/2017, 01/26/2018, 01/26/2018, 01/03/2019, 02/11/2020   Influenza,inj,Quad PF,6+ Mos 02/09/2014, 03/20/2015, 02/27/2016   Influenza-Unspecified 03/13/2013, 03/13/2017   PFIZER Comirnaty(Gray Top)Covid-19 Tri-Sucrose Vaccine 10/17/2020, 02/08/2022   PFIZER(Purple Top)SARS-COV-2 Vaccination 06/03/2019, 06/22/2019, 02/09/2020   Pfizer(Comirnaty)Fall Seasonal Vaccine 12 years and older 02/28/2023   Pneumococcal Conjugate-13 02/27/2016   Pneumococcal Polysaccharide-23 08/25/2014    Respiratory Syncytial Virus Vaccine,Recomb Aduvanted(Arexvy) 06/03/2022   Tdap 12/06/2013   Zoster Recombinant(Shingrix) 01/13/2019, 08/05/2019    Past Medical History:  Diagnosis Date   (HFpEF) heart failure with preserved ejection fraction (HCC) 12/26/2021   Echocardiogram 12/2021: EF 55-60, no RWMA, Gr 2 DD, low normal RVSF, mildly elevated PASP (RVSP 38.5), mild LAE, trivial MR   ALLERGIC RHINITIS    Anxiety    Asthma    Blood type O+    Cervical strain, acute    Cirrhosis (HCC)    Colon polyp 2010   adenoma   Diverticulosis of colon    Dizziness    Esophageal varices (HCC)    Fibromyalgia    GERD (gastroesophageal reflux disease)    History of nephrolithiasis    Hypercholesterolemia    borderline   Hypertension    IBS (irritable bowel syndrome)    Osteoarthritis    Personal history of allergy  to unspecified medicinal agent    Postconcussion syndrome    Vitamin D  deficiency     Tobacco History: Social History   Tobacco Use  Smoking Status Former   Current packs/day: 0.00   Average  packs/day: 2.0 packs/day for 8.0 years (16.0 ttl pk-yrs)   Types: Cigarettes   Start date: 05/13/1954   Quit date: 05/13/1962   Years since quitting: 61.4  Smokeless Tobacco Never   Counseling given: Not Answered   Outpatient Medications Prior to Visit  Medication Sig Dispense Refill   acetaminophen  (TYLENOL ) 500 MG tablet Take 1,000 mg by mouth as needed.     albuterol  (PROAIR  HFA) 108 (90 Base) MCG/ACT inhaler INHALE 2 PUFFS EVERY 4 HOURS AS NEEDED INTO THE LUNGS FOR WHEEZING OR SHORTNESS OF BREATH J45.20 25.5 g 5   amLODipine  (NORVASC ) 5 MG tablet TAKE 1 TABLET (5 MG TOTAL) BY MOUTH DAILY. 90 tablet 1   cetirizine (ZYRTEC) 10 MG tablet Take 10 mg by mouth daily.     Cholecalciferol 50 MCG (2000 UT) CAPS Take 2,000 Units by mouth in the morning and at bedtime.     cyanocobalamin  (VITAMIN B12) 1000 MCG tablet Take 1 tablet (1,000 mcg total) by mouth daily.     ELIQUIS  5 MG TABS tablet  TAKE 1 TABLET BY MOUTH TWICE A DAY 60 tablet 5   esomeprazole  (NEXIUM ) 20 MG capsule TAKE 1 CAPSULE BY MOUTH EVERY DAY 90 capsule 1   ezetimibe  (ZETIA ) 10 MG tablet TAKE 1 TABLET BY MOUTH EVERY DAY 90 tablet 1   Fluticasone -Umeclidin-Vilant (TRELEGY ELLIPTA ) 100-62.5-25 MCG/ACT AEPB Inhale 1 puff into the lungs daily. 3 each 3   furosemide  (LASIX ) 20 MG tablet TAKE 1 TABLET BY MOUTH EVERY DAY 90 tablet 1   losartan  (COZAAR ) 25 MG tablet TAKE 1 TABLET BY MOUTH EVERY DAY 90 tablet 1   meclizine  (ANTIVERT ) 12.5 MG tablet TAKE 1 TABLET BY MOUTH 3 TIMES DAILY AS NEEDED FOR DIZZINESS. 30 tablet 0   nystatin  ointment (MYCOSTATIN ) APPLY 1 APPLICATION TOPICALLY 2 (TWO) TIMES DAILY FOR 14 DAYS. 30 g 0   Omega-3 Fatty Acids (FISH OIL MAXIMUM STRENGTH) 1200 MG CAPS Take 1,200 mg by mouth 2 (two) times daily.      propranolol  (INDERAL ) 10 MG tablet TAKE 1 TABLET BY MOUTH TWICE A DAY 180 tablet 1   Propylene Glycol (SYSTANE BALANCE) 0.6 % SOLN Place 2 drops into both eyes 2 (two) times daily. 15 mL 0   sodium chloride  (OCEAN) 0.65 % nasal spray Place 1 spray into the nose daily.     tiZANidine (ZANAFLEX) 4 MG tablet Take 4 mg by mouth at bedtime. (Patient not taking: Reported on 09/10/2023)     TRELEGY ELLIPTA  100-62.5-25 MCG/ACT AEPB INHALE 1 PUFF BY MOUTH EVERY DAY 60 each 0   No facility-administered medications prior to visit.    Review of Systems  Review of Systems  Constitutional:  Negative for fatigue.  HENT: Negative.    Respiratory:  Positive for cough. Negative for chest tightness and wheezing.        DOE  Cardiovascular: Negative.    Physical Exam  There were no vitals taken for this visit. Physical Exam Constitutional:      General: She is not in acute distress.    Appearance: Normal appearance. She is not ill-appearing.  HENT:     Head: Normocephalic and atraumatic.  Cardiovascular:     Rate and Rhythm: Normal rate and regular rhythm.  Pulmonary:     Effort: Pulmonary effort is  normal.     Breath sounds: Normal breath sounds. No wheezing, rhonchi or rales.  Musculoskeletal:        General: Normal range of motion.     Comments: In  transport WC  Skin:    General: Skin is warm and dry.  Neurological:     General: No focal deficit present.     Mental Status: She is alert and oriented to person, place, and time. Mental status is at baseline.  Psychiatric:        Mood and Affect: Mood normal.        Behavior: Behavior normal.        Thought Content: Thought content normal.        Judgment: Judgment normal.      Lab Results:  CBC    Component Value Date/Time   WBC 6.7 01/31/2023 1019   RBC 4.85 01/31/2023 1019   HGB 14.2 01/31/2023 1019   HGB 14.5 03/30/2015 1032   HCT 43.1 01/31/2023 1019   HCT 42.0 03/30/2015 1032   PLT 278 01/31/2023 1019   PLT 385 (H) 03/30/2015 1032   MCV 88.9 01/31/2023 1019   MCV 84 03/30/2015 1032   MCH 29.3 01/31/2023 1019   MCHC 32.9 01/31/2023 1019   RDW 12.8 01/31/2023 1019   RDW 14.1 03/30/2015 1032   LYMPHSABS 3,015 01/31/2023 1019   LYMPHSABS 2.7 03/30/2015 1032   MONOABS 0.7 09/28/2021 0845   EOSABS 141 01/31/2023 1019   EOSABS 0.1 03/30/2015 1032   BASOSABS 47 01/31/2023 1019   BASOSABS 0.1 03/30/2015 1032    BMET    Component Value Date/Time   NA 141 01/31/2023 1019   NA 140 01/02/2022 1104   K 5.2 01/31/2023 1019   CL 105 01/31/2023 1019   CO2 29 01/31/2023 1019   GLUCOSE 102 (H) 01/31/2023 1019   BUN 21 01/31/2023 1019   BUN 15 01/02/2022 1104   CREATININE 0.62 01/31/2023 1019   CALCIUM  9.9 01/31/2023 1019   GFRNONAA >60 03/30/2022 1211   GFRNONAA 81 11/06/2020 0943   GFRAA 94 11/06/2020 0943    BNP    Component Value Date/Time   BNP 44.1 03/30/2022 1211    ProBNP    Component Value Date/Time   PROBNP 97 01/02/2022 1104    Imaging: No results found.   Assessment & Plan:   1. Chronic bronchitis, unspecified chronic bronchitis type (HCC) (Primary)  Assessment and Plan     Chronic bronchitis Currently well-managed with Trelegy. Reports slight dry cough twice daily and occasional congestion. No recent exacerbations requiring antibiotics or prednisone . No recent nebulizer use. Chest x-ray from 2023 was normal. Trelegy has significantly reduced bronchitis attack frequency and need for additional medications. Up to date with pneumonia and RSV vaccine. Encourage patient continue to stay active.  - Continue Trelegy Ellipta  100mcg one puff daily, refills provided   - Use Albuterol  hfa 2 puffs every 4-6 hours as needed for chest tightness, wheezing or shortness of breath    Antonio Baumgarten, NP 09/25/2023

## 2023-10-10 ENCOUNTER — Ambulatory Visit: Payer: PPO | Admitting: Family

## 2023-10-14 DIAGNOSIS — L308 Other specified dermatitis: Secondary | ICD-10-CM | POA: Diagnosis not present

## 2023-10-14 DIAGNOSIS — Z85828 Personal history of other malignant neoplasm of skin: Secondary | ICD-10-CM | POA: Diagnosis not present

## 2023-10-14 DIAGNOSIS — C4442 Squamous cell carcinoma of skin of scalp and neck: Secondary | ICD-10-CM | POA: Diagnosis not present

## 2023-10-14 DIAGNOSIS — L812 Freckles: Secondary | ICD-10-CM | POA: Diagnosis not present

## 2023-10-14 DIAGNOSIS — C4441 Basal cell carcinoma of skin of scalp and neck: Secondary | ICD-10-CM | POA: Diagnosis not present

## 2023-10-14 DIAGNOSIS — D485 Neoplasm of uncertain behavior of skin: Secondary | ICD-10-CM | POA: Diagnosis not present

## 2023-10-14 DIAGNOSIS — L821 Other seborrheic keratosis: Secondary | ICD-10-CM | POA: Diagnosis not present

## 2023-10-14 DIAGNOSIS — D1801 Hemangioma of skin and subcutaneous tissue: Secondary | ICD-10-CM | POA: Diagnosis not present

## 2023-10-17 ENCOUNTER — Ambulatory Visit: Payer: PPO | Admitting: Nurse Practitioner

## 2023-10-17 ENCOUNTER — Telehealth: Payer: Self-pay | Admitting: Nurse Practitioner

## 2023-10-17 NOTE — Telephone Encounter (Signed)
 Patient son came in and dropped off an envelope to be given to [place] per his mom. Contained transportation paperwork, given to [place] in CI

## 2023-10-20 NOTE — Telephone Encounter (Signed)
 Demographic info for our office filled in and form placed in [place], Champ [place], NP review and sign folder.   Please advise if patient needs an appointment prior to completion

## 2023-10-24 DIAGNOSIS — I5032 Chronic diastolic (congestive) heart failure: Secondary | ICD-10-CM | POA: Diagnosis not present

## 2023-10-24 DIAGNOSIS — G629 Polyneuropathy, unspecified: Secondary | ICD-10-CM | POA: Diagnosis not present

## 2023-10-24 NOTE — Telephone Encounter (Signed)
Completed and given back to CI

## 2023-10-24 NOTE — Telephone Encounter (Signed)
 Paper given to front admin Alondra.M. Papers faxed and placed up front for pick up.

## 2023-10-24 NOTE — Telephone Encounter (Signed)
 Paperwork was mailed out as requested by the patient. The patient has been notified.

## 2023-11-03 ENCOUNTER — Telehealth: Payer: Self-pay | Admitting: Nurse Practitioner

## 2023-11-03 NOTE — Telephone Encounter (Unsigned)
 Received a piece of paper with a list of Medications and supplies with a note attached stating: Please sign and sent by fax to property manager Catina Southern, these are from my reciepts from [place]-April of 2024-2025. This information will help Mrs Randine to determine my rent from 2025-2026. This will be the last form! Thank you  Lamont Randine, Property Manager [phone] Fax: [phone]   No form was attached. Just a list of OTC Medications and Supplies.   Tried calling patient to confirm and LMOM to return call.

## 2023-11-03 NOTE — Telephone Encounter (Signed)
 Patient son Carlin Hick dropped off an envelope for Har[place] to complete for his mom has to do with [employer]. Given to [place] in CI.

## 2023-11-04 NOTE — Telephone Encounter (Signed)
 Copied from CRM [id number]. Topic: General - Other >> Nov 04, 2023 10:41 AM Miquel SAILOR wrote: Reason for CRM: Patient requesting call back on update if PCP received paperwork on Form for the bulding every year for certification [phone]      Called and spoke with patient and she just stated that she just needs the supply list signed and faxed back to the Investment banker, corporate. Stated that this will determine her rent. Stated that the forms were already filled out but she is in need of this last piece of paper.   Placed in [name]'s folder to review and sign.  To be faxed back to [employer] Fax: [phone] once completed.

## 2023-11-07 NOTE — Telephone Encounter (Signed)
 Left message on voicemail for patient to return call when available. Form has been sign and given to Alondra McGill front desk and will be faxed. See previous CRM from [facility] May   Message sent to McGill Alondra

## 2023-11-07 NOTE — Telephone Encounter (Signed)
 Signed and returned to CI

## 2023-11-10 ENCOUNTER — Encounter: Payer: Self-pay | Admitting: Nurse Practitioner

## 2023-11-10 ENCOUNTER — Ambulatory Visit (INDEPENDENT_AMBULATORY_CARE_PROVIDER_SITE_OTHER): Admitting: Nurse Practitioner

## 2023-11-10 VITALS — BP 130/70 | HR 64 | Temp 96.6°F | Resp 18 | Ht 61.0 in | Wt 182.2 lb

## 2023-11-10 DIAGNOSIS — J4520 Mild intermittent asthma, uncomplicated: Secondary | ICD-10-CM

## 2023-11-10 DIAGNOSIS — E782 Mixed hyperlipidemia: Secondary | ICD-10-CM | POA: Diagnosis not present

## 2023-11-10 DIAGNOSIS — I480 Paroxysmal atrial fibrillation: Secondary | ICD-10-CM

## 2023-11-10 DIAGNOSIS — K219 Gastro-esophageal reflux disease without esophagitis: Secondary | ICD-10-CM | POA: Diagnosis not present

## 2023-11-10 DIAGNOSIS — I10 Essential (primary) hypertension: Secondary | ICD-10-CM | POA: Diagnosis not present

## 2023-11-10 DIAGNOSIS — F331 Major depressive disorder, recurrent, moderate: Secondary | ICD-10-CM

## 2023-11-10 DIAGNOSIS — R7303 Prediabetes: Secondary | ICD-10-CM | POA: Insufficient documentation

## 2023-11-10 DIAGNOSIS — K7469 Other cirrhosis of liver: Secondary | ICD-10-CM | POA: Diagnosis not present

## 2023-11-10 DIAGNOSIS — I8500 Esophageal varices without bleeding: Secondary | ICD-10-CM | POA: Diagnosis not present

## 2023-11-10 DIAGNOSIS — M170 Bilateral primary osteoarthritis of knee: Secondary | ICD-10-CM | POA: Diagnosis not present

## 2023-11-10 DIAGNOSIS — I5032 Chronic diastolic (congestive) heart failure: Secondary | ICD-10-CM

## 2023-11-10 NOTE — Assessment & Plan Note (Signed)
 Blood pressure well controlled, goal bp <140/90 Continue current medications and dietary modifications follow metabolic panel

## 2023-11-10 NOTE — Assessment & Plan Note (Signed)
 Continues on zetia , follow lipids yearly

## 2023-11-10 NOTE — Assessment & Plan Note (Signed)
 Well controlled on nexium  20 mg daily

## 2023-11-10 NOTE — Assessment & Plan Note (Signed)
 Rate controlled, continues on propanolol with eliquis  for anticoagulation

## 2023-11-10 NOTE — Assessment & Plan Note (Signed)
 Stable will follow up labs at thistime

## 2023-11-10 NOTE — Assessment & Plan Note (Signed)
 Mild intermittent well-controlled uncomplicated No recent exacerbation Continues on trelegy with albuterol  PRN

## 2023-11-10 NOTE — Assessment & Plan Note (Signed)
 Continue dietary modifications

## 2023-11-10 NOTE — Assessment & Plan Note (Signed)
Well controlled on nexium daily

## 2023-11-10 NOTE — Progress Notes (Signed)
 Careteam: Patient Care Team: [name], NP as PCP - General (Nurse Practitioner) Verlin Lonni BIRCH, MD as PCP - Cardiology (Cardiology) Mannam, Praveen, MD as Consulting Physician (Pulmonary Disease)  PLACE OF SERVICE:  [facility]  Advanced Directive information    Allergies  Allergen Reactions   Wixela Inhub [Fluticasone -Salmeterol] Shortness Of Breath   Ace Inhibitors     cough   Amoxicillin Itching    REACTION: unknown? pain in right kidney   Benicar Hct [Olmesartan Medoxomil-Hctz]     Extreme weakness   Budesonide -Formoterol  Fumarate     Causes tremors and numbness   Chlorzoxazone Hives   Chlorzoxazone [Chlorzoxazone]    Citalopram Hydrobromide     REACTION: hives   Citalopram Hydrobromide    Clonidine Derivatives    Cymbalta  [Duloxetine  Hcl]    Droperidol     REACTION: hives   Flexeril [Cyclobenzaprine Hcl]    Fluconazole  Nausea And Vomiting and Other (See Comments)    fatigue   Formoterol  Itching   Gabapentin  Itching   Ketorolac  Tromethamine      REACTION: hives   Ketorolac  Tromethamine     Metoclopramide Hcl    Mometasone  Furo-Formoterol  Fum     Causes sore throat, blurred vision and unable to sleep--started on med 09-17-10   Mometasone  Furoate Itching   Morphine     REACTION: hives and itching   Olmesartan Medoxomil     REACTION: fatique   Breo Ellipta  [Fluticasone  Furoate-Vilanterol] Itching    Pt reports itching with Breo is related to being lactose intolerant.     Chief Complaint  Patient presents with   Medical Management of Chronic Issues    4 mths /    HPI:  Discussed the use of AI scribe software for clinical note transcription with the patient, who gave verbal consent to proceed.  History of Present Illness [name] is an 83 year old female who presents for a four-month follow-up visit.  She primarily uses a wheelchair for mobility but uses a walker within her apartment to prevent further loss of mobility. She  spends most of her day in a recliner but tries to walk around during commercials. Her mobility is limited due to severe arthritis in both knees, described as 'bone on bone,' as well as arthritis in her feet, arms, and shoulders.  She recalls a past episode of severe back pain that resolved spontaneously on Easter morning two years ago, with no recurrence since. No current severe back pain.  She is currently taking losartan  25 mg for blood pressure management and propranolol , which also aids in managing her esophageal varices and rate control due to a fib.   Vitamin B12 supplementation has helped alleviate episodes of weakness, and she occasionally takes vitamin C.   For asthma, she is on Trelegy Ellipta  and Zyrtec nightly, which has significantly reduced her symptoms, and she no longer requires a breathing machine.   She also takes Eliquis  5 mg twice daily for atrial fibrillation   Zetia  for cholesterol management.   Nexium  is used for acid reflux, which is well-controlled.  She experiences occasional constipation, which she attributes to prolonged sitting, and manages this with a bowel softener, taking three in the morning, which is effective. No significant leg swelling, but her feet occasionally swell.   She uses city transportation services for mobility.    Review of Systems:  Review of Systems  Constitutional:  Negative for chills, fever and weight loss.  HENT:  Negative for tinnitus.   Respiratory:  Negative for cough, sputum production and shortness of breath.   Cardiovascular:  Negative for chest pain, palpitations and leg swelling.  Gastrointestinal:  Negative for abdominal pain, constipation, diarrhea and heartburn.  Genitourinary:  Negative for dysuria, frequency and urgency.  Musculoskeletal:  Positive for back pain, joint pain and myalgias. Negative for falls.  Skin: Negative.   Neurological:  Negative for dizziness and headaches.  Psychiatric/Behavioral:  Negative for  depression and memory loss. The patient does not have insomnia.     Past Medical History:  Diagnosis Date   (HFpEF) heart failure with preserved ejection fraction (HCC) 12/26/2021   Echocardiogram 12/2021: EF 55-60, no RWMA, Gr 2 DD, low normal RVSF, mildly elevated PASP (RVSP 38.5), mild LAE, trivial MR   ALLERGIC RHINITIS    Anxiety    Asthma    Blood type O+    Cervical strain, acute    Cirrhosis (HCC)    Colon polyp 2010   adenoma   Diverticulosis of colon    Dizziness    Esophageal varices (HCC)    Fibromyalgia    GERD (gastroesophageal reflux disease)    History of nephrolithiasis    Hypercholesterolemia    borderline   Hypertension    IBS (irritable bowel syndrome)    Osteoarthritis    Personal history of allergy  to unspecified medicinal agent    Postconcussion syndrome    Vitamin D  deficiency    Past Surgical History:  Procedure Laterality Date   CHOLECYSTECTOMY, LAPAROSCOPIC  2004   for gallstone pancreatitis   KNEE SURGERY Right    KNEE SURGERY Left    THYROID  SURGERY     cyst removed   Social History:   reports that she quit smoking about 61 years ago. Her smoking use included cigarettes. She started smoking about 69 years ago. She has a 16 pack-year smoking history. She has never used smokeless tobacco. She reports that she does not drink alcohol  and does not use drugs.  Family History  Problem Relation Age of Onset   Breast cancer Mother    Alzheimer's disease Mother    Ovarian cancer Sister    Heart disease Sister    Breast cancer Sister 91   Alcohol  abuse Sister    COPD Brother    Alcohol  abuse Brother    Ovarian cancer Paternal Grandmother    Heart attack Maternal Uncle    COPD Cousin        Maternal side    Arthritis Other        Cousins    Colon cancer Neg Hx    Esophageal cancer Neg Hx     Medications: Patient's Medications  New Prescriptions   No medications on file  Previous Medications   ACETAMINOPHEN  (TYLENOL ) 500 MG TABLET     Take 1,000 mg by mouth as needed.   ALBUTEROL  (PROAIR  HFA) 108 (90 BASE) MCG/ACT INHALER    INHALE 2 PUFFS EVERY 4 HOURS AS NEEDED INTO THE LUNGS FOR WHEEZING OR SHORTNESS OF BREATH J45.20   AMLODIPINE  (NORVASC ) 5 MG TABLET    TAKE 1 TABLET (5 MG TOTAL) BY MOUTH DAILY.   CETIRIZINE (ZYRTEC) 10 MG TABLET    Take 10 mg by mouth daily.   CHOLECALCIFEROL 50 MCG (2000 UT) CAPS    Take 2,000 Units by mouth in the morning and at bedtime.   CYANOCOBALAMIN  (VITAMIN B12) 1000 MCG TABLET    Take 1 tablet (1,000 mcg total) by mouth daily.   ELIQUIS  5 MG TABS TABLET  TAKE 1 TABLET BY MOUTH TWICE A DAY   ESOMEPRAZOLE  (NEXIUM ) 20 MG CAPSULE    TAKE 1 CAPSULE BY MOUTH EVERY DAY   EZETIMIBE  (ZETIA ) 10 MG TABLET    TAKE 1 TABLET BY MOUTH EVERY DAY   FLUTICASONE -UMECLIDIN-VILANT (TRELEGY ELLIPTA ) 100-62.5-25 MCG/ACT AEPB    Inhale 1 puff into the lungs daily.   FUROSEMIDE  (LASIX ) 20 MG TABLET    TAKE 1 TABLET BY MOUTH EVERY DAY   LOSARTAN  (COZAAR ) 25 MG TABLET    TAKE 1 TABLET BY MOUTH EVERY DAY   MECLIZINE  (ANTIVERT ) 12.5 MG TABLET    TAKE 1 TABLET BY MOUTH 3 TIMES DAILY AS NEEDED FOR DIZZINESS.   OMEGA-3 FATTY ACIDS (FISH OIL MAXIMUM STRENGTH) 1200 MG CAPS    Take 1,200 mg by mouth 2 (two) times daily.    PROPRANOLOL  (INDERAL ) 10 MG TABLET    TAKE 1 TABLET BY MOUTH TWICE A DAY   PROPYLENE GLYCOL (SYSTANE BALANCE) 0.6 % SOLN    Place 2 drops into both eyes 2 (two) times daily.   SODIUM CHLORIDE  (OCEAN) 0.65 % NASAL SPRAY    Place 1 spray into the nose daily.  Modified Medications   No medications on file  Discontinued Medications   TIZANIDINE (ZANAFLEX) 4 MG TABLET    Take 4 mg by mouth at bedtime.   TRELEGY ELLIPTA  100-62.5-25 MCG/ACT AEPB    Inhale 1 puff into the lungs daily.    Physical Exam:  Vitals:   11/10/23 1202  BP: 130/70  Pulse: 64  Resp: 18  Temp: (!) 96.6 F (35.9 C)  SpO2: 97%  Weight: 182 lb 3.2 oz (82.6 kg)  Height: 5' 1 (1.549 m)   Body mass index is 34.43 kg/m. Wt Readings  from Last 3 Encounters:  11/10/23 182 lb 3.2 oz (82.6 kg)  09/25/23 186 lb 6.4 oz (84.6 kg)  09/10/23 187 lb (84.8 kg)    Physical Exam Constitutional:      General: She is not in acute distress.    Appearance: She is well-developed. She is not diaphoretic.  HENT:     Head: Normocephalic and atraumatic.     Mouth/Throat:     Pharynx: No oropharyngeal exudate.   Eyes:     Conjunctiva/sclera: Conjunctivae normal.     Pupils: Pupils are equal, round, and reactive to light.    Cardiovascular:     Rate and Rhythm: Normal rate and regular rhythm.     Heart sounds: Normal heart sounds.  Pulmonary:     Effort: Pulmonary effort is normal.     Breath sounds: Normal breath sounds.  Abdominal:     General: Bowel sounds are normal.     Palpations: Abdomen is soft.   Musculoskeletal:     Cervical back: Normal range of motion and neck supple.     Right lower leg: No edema.     Left lower leg: No edema.   Skin:    General: Skin is warm and dry.   Neurological:     Mental Status: She is alert.   Psychiatric:        Mood and Affect: Mood normal.     Labs reviewed: Basic Metabolic Panel: Recent Labs    01/31/23 1019  NA 141  K 5.2  CL 105  CO2 29  GLUCOSE 102*  BUN 21  CREATININE 0.62  CALCIUM  9.9  TSH 2.40   Liver Function Tests: Recent Labs    01/31/23 1019  AST 22  ALT 19  BILITOT 0.6  PROT 6.4   No results for input(s): LIPASE, AMYLASE in the last 8760 hours. No results for input(s): AMMONIA in the last 8760 hours. CBC: Recent Labs    01/31/23 1019  WBC 6.7  NEUTROABS 2,760  HGB 14.2  HCT 43.1  MCV 88.9  PLT 278   Lipid Panel: Recent Labs    01/31/23 1019  CHOL 178  HDL 66  LDLCALC 93  TRIG 96  CHOLHDL 2.7   TSH: Recent Labs    01/31/23 1019  TSH 2.40   A1C: Lab Results  Component Value Date   HGBA1C 6.2 (H) 01/31/2023     Assessment/Plan Paroxysmal atrial fibrillation (HCC) Assessment & Plan: Rate controlled,  continues on propanolol with eliquis  for anticoagulation     Essential hypertension Assessment & Plan: Blood pressure well controlled, goal bp <140/90 Continue current medications and dietary modifications follow metabolic panel  Orders: -     CBC with Differential/Platelet -     COMPLETE METABOLIC PANEL WITHOUT GFR  Chronic heart failure with preserved ejection fraction (HCC) Assessment & Plan: Euvolemic at this time, continues on lasix  20 mg daily with losartan  25 mg daily    Prediabetes Assessment & Plan: Continue dietary modifications  Orders: -     Hemoglobin A1c  Primary osteoarthritis of both knees Assessment & Plan: Ongoing but stable, continues on tylenol  PRN   Mild intermittent asthmatic bronchitis without complication Assessment & Plan: Mild intermittent well-controlled uncomplicated No recent exacerbation Continues on trelegy with albuterol  PRN   Mixed hyperlipidemia Assessment & Plan: Continues on zetia , follow lipids yearly   Orders: -     Lipid panel  Gastroesophageal reflux disease without esophagitis Assessment & Plan: Well controlled on nexium  20 mg daily    Other cirrhosis of liver (HCC) Assessment & Plan: Stable will follow up labs at thistime   Esophageal varices without bleeding, unspecified esophageal varices type (HCC) Assessment & Plan: No signs of bleeding, managed by GI, continues on propranolol     Moderate episode of recurrent major depressive disorder (HCC) Assessment & Plan: Well controlled on nexium  daily      Return in about 4 months (around 03/11/2024) for routine follow up.  Briane Birden K. Caro BODILY [facility] [phone]

## 2023-11-10 NOTE — Assessment & Plan Note (Signed)
 Ongoing but stable, continues on tylenol  PRN

## 2023-11-10 NOTE — Assessment & Plan Note (Signed)
 No signs of bleeding, managed by GI, continues on propranolol 

## 2023-11-10 NOTE — Assessment & Plan Note (Signed)
 Euvolemic at this time, continues on lasix  20 mg daily with losartan  25 mg daily

## 2023-11-11 ENCOUNTER — Ambulatory Visit: Payer: Self-pay | Admitting: Nurse Practitioner

## 2023-11-11 LAB — HEMOGLOBIN A1C
Hgb A1c MFr Bld: 6 % — ABNORMAL HIGH (ref <5.7)
Mean Plasma Glucose: 126 mg/dL
eAG (mmol/L): 7 mmol/L

## 2023-11-11 LAB — COMPREHENSIVE METABOLIC PANEL WITH GFR
AG Ratio: 1.8 (calc) (ref 1.0–2.5)
ALT: 22 U/L (ref 6–29)
AST: 27 U/L (ref 10–35)
Albumin: 4.4 g/dL (ref 3.6–5.1)
Alkaline phosphatase (APISO): 92 U/L (ref 37–153)
BUN: 21 mg/dL (ref 7–25)
CO2: 28 mmol/L (ref 20–32)
Calcium: 10.1 mg/dL (ref 8.6–10.4)
Chloride: 104 mmol/L (ref 98–110)
Creat: 0.68 mg/dL (ref 0.60–0.95)
Globulin: 2.5 g/dL (ref 1.9–3.7)
Glucose, Bld: 100 mg/dL — ABNORMAL HIGH (ref 65–99)
Potassium: 4.9 mmol/L (ref 3.5–5.3)
Sodium: 141 mmol/L (ref 135–146)
Total Bilirubin: 0.7 mg/dL (ref 0.2–1.2)
Total Protein: 6.9 g/dL (ref 6.1–8.1)
eGFR: 85 mL/min/{1.73_m2} (ref >=60)

## 2023-11-11 LAB — LIPID PANEL
Cholesterol: 171 mg/dL (ref <200)
HDL: 41 mg/dL — ABNORMAL LOW (ref >=50)
LDL Cholesterol (Calc): 93 mg/dL
Non-HDL Cholesterol (Calc): 130 mg/dL — ABNORMAL HIGH (ref <130)
Total CHOL/HDL Ratio: 4.2 (calc) (ref <5.0)
Triglycerides: 244 mg/dL — ABNORMAL HIGH (ref <150)

## 2023-11-11 LAB — CBC WITH DIFFERENTIAL/PLATELET
Absolute Lymphocytes: 3944 {cells}/uL — ABNORMAL HIGH (ref 850–3900)
Absolute Monocytes: 893 {cells}/uL (ref 200–950)
Basophils Absolute: 51 {cells}/uL (ref 0–200)
Basophils Relative: 0.6 %
Eosinophils Absolute: 179 {cells}/uL (ref 15–500)
Eosinophils Relative: 2.1 %
HCT: 46.4 % — ABNORMAL HIGH (ref 35.0–45.0)
Hemoglobin: 14.7 g/dL (ref 11.7–15.5)
MCH: 29.1 pg (ref 27.0–33.0)
MCHC: 31.7 g/dL — ABNORMAL LOW (ref 32.0–36.0)
MCV: 91.7 fL (ref 80.0–100.0)
MPV: 10.5 fL (ref 7.5–12.5)
Monocytes Relative: 10.5 %
Neutro Abs: 3434 {cells}/uL (ref 1500–7800)
Neutrophils Relative %: 40.4 %
Platelets: 286 10*3/uL (ref 140–400)
RBC: 5.06 10*6/uL (ref 3.80–5.10)
RDW: 13 % (ref 11.0–15.0)
Total Lymphocyte: 46.4 %
WBC: 8.5 10*3/uL (ref 3.8–10.8)

## 2023-11-23 DIAGNOSIS — G629 Polyneuropathy, unspecified: Secondary | ICD-10-CM | POA: Diagnosis not present

## 2023-11-23 DIAGNOSIS — I5032 Chronic diastolic (congestive) heart failure: Secondary | ICD-10-CM | POA: Diagnosis not present

## 2023-11-28 ENCOUNTER — Other Ambulatory Visit: Payer: Self-pay | Admitting: Adult Health

## 2023-11-28 ENCOUNTER — Telehealth: Payer: Self-pay

## 2023-11-28 ENCOUNTER — Other Ambulatory Visit: Payer: Self-pay | Admitting: Orthopedic Surgery

## 2023-11-28 DIAGNOSIS — B372 Candidiasis of skin and nail: Secondary | ICD-10-CM

## 2023-11-28 DIAGNOSIS — B379 Candidiasis, unspecified: Secondary | ICD-10-CM

## 2023-11-28 MED ORDER — NYSTATIN 100000 UNIT/GM EX CREA: 1.0000 | TOPICAL_CREAM | Freq: Two times a day (BID) | CUTANEOUS | 1 refills | Status: DC

## 2023-11-28 NOTE — Telephone Encounter (Signed)
 Copied from CRM [id number]. Topic: Clinical - Medication Question >> Nov 28, 2023  2:49 PM Susanna ORN wrote: Reason for CRM: Patient called stating that she has another yeast infection. States Dr. Jereld Delude gave her a Nystatin  100000 Unit prescription back in April and it cleared it up. Patient is wanting to know if Dr. Caro can prescribe this again for her and send it to [facility] on [address]. Please give patient a call back to advise. CB #: [id number].

## 2023-11-28 NOTE — Telephone Encounter (Signed)
 Nystatin  prescription sent to pharmacy. Voicemail left for patient regarding refill.

## 2023-12-15 ENCOUNTER — Other Ambulatory Visit: Payer: Self-pay | Admitting: Cardiovascular Disease

## 2023-12-24 ENCOUNTER — Other Ambulatory Visit: Payer: Self-pay | Admitting: Nurse Practitioner

## 2023-12-24 DIAGNOSIS — I5032 Chronic diastolic (congestive) heart failure: Secondary | ICD-10-CM | POA: Diagnosis not present

## 2023-12-24 DIAGNOSIS — G629 Polyneuropathy, unspecified: Secondary | ICD-10-CM | POA: Diagnosis not present

## 2024-01-18 ENCOUNTER — Other Ambulatory Visit: Payer: Self-pay | Admitting: Cardiovascular Disease

## 2024-01-24 DIAGNOSIS — I5032 Chronic diastolic (congestive) heart failure: Secondary | ICD-10-CM | POA: Diagnosis not present

## 2024-01-24 DIAGNOSIS — G629 Polyneuropathy, unspecified: Secondary | ICD-10-CM | POA: Diagnosis not present

## 2024-01-26 DIAGNOSIS — M25562 Pain in left knee: Secondary | ICD-10-CM | POA: Diagnosis not present

## 2024-01-26 DIAGNOSIS — M25561 Pain in right knee: Secondary | ICD-10-CM | POA: Diagnosis not present

## 2024-01-30 DIAGNOSIS — H04123 Dry eye syndrome of bilateral lacrimal glands: Secondary | ICD-10-CM | POA: Diagnosis not present

## 2024-01-30 DIAGNOSIS — H53143 Visual discomfort, bilateral: Secondary | ICD-10-CM | POA: Diagnosis not present

## 2024-01-30 DIAGNOSIS — Z961 Presence of intraocular lens: Secondary | ICD-10-CM | POA: Diagnosis not present

## 2024-01-30 DIAGNOSIS — Z9849 Cataract extraction status, unspecified eye: Secondary | ICD-10-CM | POA: Diagnosis not present

## 2024-02-06 ENCOUNTER — Ambulatory Visit: Payer: PPO | Admitting: Nurse Practitioner

## 2024-02-06 ENCOUNTER — Encounter: Payer: Self-pay | Admitting: Nurse Practitioner

## 2024-02-06 VITALS — BP 130/66 | HR 65 | Temp 97.2°F | Resp 16 | Ht 61.0 in | Wt 173.0 lb

## 2024-02-06 DIAGNOSIS — Z0000 Encounter for general adult medical examination without abnormal findings: Secondary | ICD-10-CM

## 2024-02-06 DIAGNOSIS — Z23 Encounter for immunization: Secondary | ICD-10-CM

## 2024-02-06 NOTE — Patient Instructions (Signed)
  [name] , Thank you for taking time to come for your Medicare Wellness Visit. I appreciate your ongoing commitment to your health goals. Please review the following plan we discussed and let me know if I can assist you in the future.   When you go to the pharmacy to get TDAP and COVID booster   This is a list of the screening recommended for you and due dates:  Health Maintenance  Topic Date Due   DTaP/Tdap/Td vaccine (2 - Td or Tdap) 12/07/2023   COVID-19 Vaccine (7 - 2024-25 season) 01/12/2024   Medicare Annual Wellness Visit  02/05/2025   Pneumococcal Vaccine for age over 3  Completed   Flu Shot  Completed   DEXA scan (bone density measurement)  Completed   Zoster (Shingles) Vaccine  Completed   HPV Vaccine  Aged Out   Meningitis B Vaccine  Aged Out   Colon Cancer Screening  Discontinued

## 2024-02-06 NOTE — Progress Notes (Signed)
 Subjective:   [name] is a 83 y.o. female who presents for Medicare Annual (Subsequent) preventive examination.  Visit Complete: In person [facility] clinic   Cardiac Risk Factors include: advanced age (>62men, >62 women);dyslipidemia;hypertension;sedentary lifestyle;obesity (BMI >30kg/m2)     Objective:    Today's Vitals   02/06/24 0934 02/06/24 0944  BP: 130/66   Pulse: 65   Resp: 16   Temp: (!) 97.2 F (36.2 C)   SpO2: 96%   Weight: 173 lb (78.5 kg)   Height: 5' 1 (1.549 m)   PainSc:  7    Body mass index is 32.69 kg/m. Wt Readings from Last 3 Encounters:  02/06/24 173 lb (78.5 kg)  11/10/23 182 lb 3.2 oz (82.6 kg)  09/25/23 186 lb 6.4 oz (84.6 kg)        02/06/2024    9:34 AM 06/06/2023    9:48 AM 04/25/2023    9:18 AM 03/11/2023    1:09 PM 02/03/2023    9:30 AM 01/30/2023    4:41 PM 04/08/2022    9:31 AM  Advanced Directives  Does Patient Have a Medical Advance Directive? Yes Yes Yes Yes Yes Yes Yes  Type of Advance Directive Out of facility DNR (pink MOST or yellow form) Out of facility DNR (pink MOST or yellow form) Out of facility DNR (pink MOST or yellow form) Out of facility DNR (pink MOST or yellow form) Out of facility DNR (pink MOST or yellow form) Out of facility DNR (pink MOST or yellow form) Out of facility DNR (pink MOST or yellow form)  Does patient want to make changes to medical advance directive? No - Patient declined No - Patient declined No - Patient declined No - Patient declined No - Patient declined No - Patient declined No - Patient declined  Pre-existing out of facility DNR order (yellow form or pink MOST form)  Yellow form placed in chart (order not valid for inpatient use);Pink MOST form placed in chart (order not valid for inpatient use) Yellow form placed in chart (order not valid for inpatient use);Pink MOST form placed in chart (order not valid for inpatient use) Yellow form placed in chart (order not valid for inpatient use);Pink MOST  form placed in chart (order not valid for inpatient use) Yellow form placed in chart (order not valid for inpatient use);Pink MOST form placed in chart (order not valid for inpatient use) Yellow form placed in chart (order not valid for inpatient use);Pink MOST form placed in chart (order not valid for inpatient use)     Current Medications (verified) Outpatient Encounter Medications as of 02/06/2024  Medication Sig   acetaminophen  (TYLENOL ) 500 MG tablet Take 1,000 mg by mouth as needed.   albuterol  (PROAIR  HFA) 108 (90 Base) MCG/ACT inhaler INHALE 2 PUFFS EVERY 4 HOURS AS NEEDED INTO THE LUNGS FOR WHEEZING OR SHORTNESS OF BREATH J45.20   amLODipine  (NORVASC ) 5 MG tablet TAKE 1 TABLET (5 MG TOTAL) BY MOUTH DAILY.   cetirizine (ZYRTEC) 10 MG tablet Take 10 mg by mouth daily.   cyanocobalamin  (VITAMIN B12) 1000 MCG tablet Take 1 tablet (1,000 mcg total) by mouth daily.   ELIQUIS  5 MG TABS tablet TAKE 1 TABLET BY MOUTH TWICE A DAY   esomeprazole  (NEXIUM ) 20 MG capsule TAKE 1 CAPSULE BY MOUTH EVERY DAY   ezetimibe  (ZETIA ) 10 MG tablet TAKE 1 TABLET BY MOUTH EVERY DAY   Fluticasone -Umeclidin-Vilant (TRELEGY ELLIPTA ) 100-62.5-25 MCG/ACT AEPB Inhale 1 puff into the lungs daily.   furosemide  (  LASIX ) 20 MG tablet TAKE 1 TABLET BY MOUTH DAILY. PATIENT MUST CALL&SCHEDULE APPOINTMENT FOR FURTHER REFILLS   losartan  (COZAAR ) 25 MG tablet TAKE 1 TABLET BY MOUTH EVERY DAY   meclizine  (ANTIVERT ) 12.5 MG tablet TAKE 1 TABLET BY MOUTH 3 TIMES DAILY AS NEEDED FOR DIZZINESS.   Omega-3 Fatty Acids (FISH OIL MAXIMUM STRENGTH) 1200 MG CAPS Take 1,200 mg by mouth 2 (two) times daily.    propranolol  (INDERAL ) 10 MG tablet TAKE 1 TABLET BY MOUTH TWICE A DAY   sodium chloride  (OCEAN) 0.65 % nasal spray Place 1 spray into the nose daily.   Cholecalciferol 50 MCG (2000 UT) CAPS Take 2,000 Units by mouth in the morning and at bedtime. (Patient not taking: Reported on 02/06/2024)   nystatin  cream (MYCOSTATIN ) Apply 1  Application topically 2 (two) times daily. (Patient not taking: Reported on 02/06/2024)   Propylene Glycol (SYSTANE BALANCE) 0.6 % SOLN Place 2 drops into both eyes 2 (two) times daily. (Patient not taking: Reported on 02/06/2024)   No facility-administered encounter medications on file as of 02/06/2024.    Allergies (verified) Wixela inhub [fluticasone -salmeterol], Ace inhibitors, Amoxicillin, Benicar hct [olmesartan medoxomil-hctz], Budesonide -formoterol  fumarate, Chlorzoxazone, Chlorzoxazone [chlorzoxazone], Citalopram hydrobromide, Citalopram hydrobromide, Clonidine derivatives, Cymbalta  [duloxetine  hcl], Droperidol, Flexeril [cyclobenzaprine hcl], Fluconazole , Formoterol , Gabapentin , Ketorolac  tromethamine , Ketorolac  tromethamine , Metoclopramide hcl, Mometasone  furo-formoterol  fum, Mometasone  furoate, Morphine, Olmesartan medoxomil, and Breo ellipta  [fluticasone  furoate-vilanterol]   History: Past Medical History:  Diagnosis Date   (HFpEF) heart failure with preserved ejection fraction (HCC) 12/26/2021   Echocardiogram 12/2021: EF 55-60, no RWMA, Gr 2 DD, low normal RVSF, mildly elevated PASP (RVSP 38.5), mild LAE, trivial MR   ALLERGIC RHINITIS    Anxiety    Asthma    Blood type O+    Cervical strain, acute    Cirrhosis (HCC)    Colon polyp 2010   adenoma   Diverticulosis of colon    Dizziness    Esophageal varices (HCC)    Fibromyalgia    GERD (gastroesophageal reflux disease)    History of nephrolithiasis    Hypercholesterolemia    borderline   Hypertension    IBS (irritable bowel syndrome)    Osteoarthritis    Personal history of allergy  to unspecified medicinal agent    Postconcussion syndrome    Vitamin D  deficiency    Past Surgical History:  Procedure Laterality Date   CHOLECYSTECTOMY, LAPAROSCOPIC  2004   for gallstone pancreatitis   KNEE SURGERY Right    KNEE SURGERY Left    THYROID  SURGERY     cyst removed   Family History  Problem Relation Age of Onset    Breast cancer Mother    Alzheimer's disease Mother    Ovarian cancer Sister    Heart disease Sister    Breast cancer Sister 66   Alcohol  abuse Sister    COPD Brother    Alcohol  abuse Brother    Ovarian cancer Paternal Grandmother    Heart attack Maternal Uncle    COPD Cousin        Maternal side    Arthritis Other        Cousins    Colon cancer Neg Hx    Esophageal cancer Neg Hx    Social History   Socioeconomic History   Marital status: Divorced    Spouse name: Not on file   Number of children: 1   Years of education: Not on file   Highest education level: Not on file  Occupational History  Occupation: retired    Associate Professor: Social research officer, government [employer]    Comment: Comptroller  Tobacco Use   Smoking status: Former    Current packs/day: 0.00    Average packs/day: 2.0 packs/day for 8.0 years (16.0 ttl pk-yrs)    Types: Cigarettes    Start date: 05/13/1954    Quit date: 05/13/1962    Years since quitting: 61.7   Smokeless tobacco: Never  Vaping Use   Vaping status: Never Used  Substance and Sexual Activity   Alcohol  use: No   Drug use: No   Sexual activity: Not Currently    Birth control/protection: Post-menopausal  Other Topics Concern   Not on file  Social History Narrative   exercises 3x a week   drinks 1 cup caffeine every other day   Social Drivers of Health   Financial Resource Strain: Low Risk  (07/16/2017)   Overall Financial Resource Strain (CARDIA)    Difficulty of Paying Living Expenses: Not hard at all  Food Insecurity: No Food Insecurity (02/06/2024)   Hunger Vital Sign    Worried About Running Out of Food in the Last Year: Never true    Ran Out of Food in the Last Year: Never true  Transportation Needs: No Transportation Needs (02/06/2024)   PRAPARE - Administrator, Civil Service (Medical): No    Lack of Transportation (Non-Medical): No  Physical Activity: Inactive (07/16/2017)   Exercise Vital Sign    Days of Exercise per  Week: 0 days    Minutes of Exercise per Session: 0 min  Stress: Stress Concern Present (07/16/2017)   [employer] of Occupational Health - Occupational Stress Questionnaire    Feeling of Stress : Rather much  Social Connections: Moderately Isolated (02/06/2024)   Social Connection and Isolation Panel    Frequency of Communication with Friends and Family: More than three times a week    Frequency of Social Gatherings with Friends and Family: More than three times a week    Attends Religious Services: More than 4 times per year    Active Member of [employer] or Organizations: No    Attends Engineer, structural: Never    Marital Status: Divorced    Tobacco Counseling Counseling given: Not Answered   Clinical Intake:  Pre-visit preparation completed: Yes  Pain : 0-10 Pain Score: 7  Pain Type: Chronic pain Pain Location: Hip Pain Orientation: Left Pain Descriptors / Indicators: Aching     BMI - recorded: 32.69 Nutritional Status: BMI > 30  Obese Diabetes: No  How often do you need to have someone help you when you read instructions, pamphlets, or other written materials from your doctor or pharmacy?: 1 - Never         Activities of Daily Living    02/06/2024    9:42 AM  In your present state of health, do you have any difficulty performing the following activities:  Hearing? 1  Vision? 0  Difficulty concentrating or making decisions? 1  Walking or climbing stairs? 1  Comment does not climb stairs  Dressing or bathing? 0  Doing errands, shopping? 0  Preparing Food and eating ? N  Using the Toilet? N  In the past six months, have you accidently leaked urine? N  Do you have problems with loss of bowel control? N  Managing your Medications? N  Managing your Finances? N  Housekeeping or managing your Housekeeping? N    Patient Care Team: Rashaan Wyles K, NP as  PCP - General (Nurse Practitioner) Verlin Lonni BIRCH, MD as PCP - Cardiology  (Cardiology) Mannam, Praveen, MD as Consulting Physician (Pulmonary Disease)  Indicate any recent Medical Services you may have received from other than [facility] providers in the past year (date may be approximate).     Assessment:   This is a routine wellness examination for Monzerat.  Hearing/Vision screen Hearing Screening - Comments:: Some hearing concerns.  Vision Screening - Comments:: No vision concerns. Patient last eye exam 01/30/2024. Patient wears prescription glasses.   Goals Addressed   None    Depression Screen    02/06/2024    9:30 AM 11/10/2023   12:04 PM 09/10/2023   10:32 AM 06/06/2023    9:47 AM 03/11/2023    1:10 PM 03/11/2023    1:09 PM 01/31/2023    9:45 AM  PHQ 2/9 Scores  PHQ - 2 Score 0 0 0 1 0 0 0  PHQ- 9 Score  0   0      Fall Risk    02/06/2024    9:30 AM 09/25/2023   10:56 AM 09/10/2023   10:32 AM 06/06/2023    9:47 AM 04/25/2023    9:45 AM  Fall Risk   Falls in the past year? 0 1 0 0 1  Number falls in past yr: 0 1 0 0 0  Injury with Fall? 0 0 0 0 0  Risk for fall due to : No Fall Risks  No Fall Risks  No Fall Risks  Follow up Falls evaluation completed  Falls prevention discussed;Falls evaluation completed  Falls evaluation completed    MEDICARE RISK AT HOME: Medicare Risk at Home Any stairs in or around the home?: No If so, are there any without handrails?: Yes Home free of loose throw rugs in walkways, pet beds, electrical cords, etc?: Yes Adequate lighting in your home to reduce risk of falls?: Yes Life alert?: No Use of a cane, walker or w/c?: Yes Grab bars in the bathroom?: Yes Shower chair or bench in shower?: Yes Elevated toilet seat or a handicapped toilet?: No  TIMED UP AND GO:  Was the test performed?  No    Cognitive Function:    01/31/2023    9:59 AM 07/16/2017   12:56 PM 07/02/2016    9:19 AM 04/04/2015   11:16 AM 09/22/2013    2:10 PM  MMSE - Mini Mental State Exam  Not completed:    --   Orientation to time 5 5 5  5  5     Orientation to Place 5 5 4  5  5    Registration 3 3 3  3  3    Attention/ Calculation 5 5 5  5  5    Recall 2 2 3  2  2    Language- name 2 objects 2 2 2  2  2    Language- repeat 1 1 1 1 1   Language- follow 3 step command 3 3 3  3  3    Language- read & follow direction 1 1 1  1  1    Write a sentence 1 1 1  1  1    Copy design 1 1 1  1  1    Total score 29 29 29  29  29       Data saved with a previous flowsheet row definition        02/06/2024    9:31 AM 01/22/2022    9:51 AM 01/16/2021    9:39 AM 10/01/2019  8:44 AM 09/24/2018    8:46 AM  6CIT Screen  What Year? 0 points 0 points 0 points 0 points   What month? 0 points 0 points 0 points 0 points   What time? 0 points 0 points 3 points 0 points 0 points  Count back from 20 2 points 0 points 2 points 0 points 0 points  Months in reverse 2 points 0 points 0 points 0 points 2 points  Repeat phrase 4 points 2 points 8 points 0 points 0 points  Total Score 8 points 2 points 13 points 0 points     Immunizations Immunization History  Administered Date(s) Administered   Fluad Quad(high Dose 65+) 02/27/2021, 02/04/2022   Fluad Trivalent(High Dose 65+) 01/31/2023   Hepatitis A, Adult 10/15/2017, 04/17/2018   INFLUENZA, HIGH DOSE SEASONAL PF 04/24/2017, 01/26/2018, 01/26/2018, 01/03/2019, 02/11/2020, 02/04/2022, 02/06/2024   Influenza Split 02/11/2011, 02/25/2012   Influenza Whole 02/10/2009, 02/26/2010   Influenza,inj,Quad PF,6+ Mos 02/09/2014, 03/20/2015, 02/27/2016   Influenza-Unspecified 03/13/2013, 03/13/2017   PFIZER Comirnaty(Gray Top)Covid-19 Tri-Sucrose Vaccine 10/17/2020, 02/08/2022   PFIZER(Purple Top)SARS-COV-2 Vaccination 06/03/2019, 06/22/2019, 02/09/2020   Pfizer(Comirnaty)Fall Seasonal Vaccine 12 years and older 02/28/2023   Pneumococcal Conjugate-13 02/27/2016   Pneumococcal Polysaccharide-23 08/25/2014   Respiratory Syncytial Virus Vaccine,Recomb Aduvanted(Arexvy) 06/03/2022   Tdap 12/06/2013   Zoster  Recombinant(Shingrix) 01/13/2019, 08/05/2019    TDAP status: Due, Education has been provided regarding the importance of this vaccine. Advised may receive this vaccine at local pharmacy or Health Dept. Aware to provide a copy of the vaccination record if obtained from local pharmacy or Health Dept. Verbalized acceptance and understanding.  Flu Vaccine status: Due, Education has been provided regarding the importance of this vaccine. Advised may receive this vaccine at local pharmacy or Health Dept. Aware to provide a copy of the vaccination record if obtained from local pharmacy or Health Dept. Verbalized acceptance and understanding.  Pneumococcal vaccine status: Up to date  Covid-19 vaccine status: Information provided on how to obtain vaccines.   Qualifies for Shingles Vaccine? Yes   Zostavax completed No   Shingrix Completed?: Yes  Screening Tests Health Maintenance  Topic Date Due   DTaP/Tdap/Td (2 - Td or Tdap) 12/07/2023   COVID-19 Vaccine (7 - 2024-25 season) 01/12/2024   Medicare Annual Wellness (AWV)  02/05/2025   Pneumococcal Vaccine: 50+ Years  Completed   Influenza Vaccine  Completed   DEXA SCAN  Completed   Zoster Vaccines- Shingrix  Completed   HPV VACCINES  Aged Out   Meningococcal B Vaccine  Aged Out   Colonoscopy  Discontinued    Health Maintenance  Health Maintenance Due  Topic Date Due   DTaP/Tdap/Td (2 - Td or Tdap) 12/07/2023   COVID-19 Vaccine (7 - 2024-25 season) 01/12/2024    Colorectal cancer screening: No longer required.   Mammogram status: No longer required due to age.  Declines bone density   Lung Cancer Screening: (Low Dose CT Chest recommended if Age 72-80 years, 20 pack-year currently smoking OR have quit w/in 15years.) does not qualify.   Lung Cancer Screening Referral: na  Additional Screening:  Hepatitis C Screening: does not qualify  Vision Screening: Recommended annual ophthalmology exams for early detection of glaucoma and  other disorders of the eye. Is the patient up to date with their annual eye exam?  Yes  Who is the provider or what is the name of the office in which the patient attends annual eye exams? [facility] If pt is not established with a provider,  would they like to be referred to a provider to establish care? No .   Dental Screening: Recommended annual dental exams for proper oral hygiene   Community Resource Referral / Chronic Care Management: CRR required this visit?  No   CCM required this visit?  No     Plan:     I have personally reviewed and noted the following in the patient's chart:   Medical and social history Use of alcohol , tobacco or illicit drugs  Current medications and supplements including opioid prescriptions. Patient is not currently taking opioid prescriptions. Functional ability and status Nutritional status Physical activity Advanced directives List of other physicians Hospitalizations, surgeries, and ER visits in previous 12 months Vitals Screenings to include cognitive, depression, and falls Referrals and appointments  In addition, I have reviewed and discussed with patient certain preventive protocols, quality metrics, and best practice recommendations. A written personalized care plan for preventive services as well as general preventive health recommendations were provided to patient.     Harlene MARLA An, NP   02/06/2024

## 2024-02-16 ENCOUNTER — Other Ambulatory Visit: Payer: Self-pay | Admitting: Nurse Practitioner

## 2024-02-16 DIAGNOSIS — R7303 Prediabetes: Secondary | ICD-10-CM

## 2024-02-16 DIAGNOSIS — I10 Essential (primary) hypertension: Secondary | ICD-10-CM

## 2024-02-16 DIAGNOSIS — R809 Proteinuria, unspecified: Secondary | ICD-10-CM

## 2024-02-16 NOTE — Telephone Encounter (Signed)
 High Risk Warning Populated when attempting to refill, I will send to Provider for further review

## 2024-02-23 DIAGNOSIS — G629 Polyneuropathy, unspecified: Secondary | ICD-10-CM | POA: Diagnosis not present

## 2024-02-23 DIAGNOSIS — I5032 Chronic diastolic (congestive) heart failure: Secondary | ICD-10-CM | POA: Diagnosis not present

## 2024-03-01 ENCOUNTER — Ambulatory Visit: Payer: Self-pay

## 2024-03-01 NOTE — Telephone Encounter (Signed)
 Called and spoke with patient. Patient complains of Chest Congestion and URI.  Appointment scheduled for Dr. Veludandi for 10/21

## 2024-03-01 NOTE — Telephone Encounter (Signed)
 FYI Only or Action Required?: FYI only for provider.  Patient was last seen in primary care on 02/06/2024 by [name], NP.  Called Nurse Triage reporting Shortness of Breath.  Symptoms began yesterday.  Interventions attempted: OTC medications: mucinex.  Symptoms are: stable.  Triage Disposition: See HCP Within 4 Hours (Or PCP Triage)  Patient/caregiver understands and will follow disposition?: No, refuses disposition        Copied from CRM #[id number]. Topic: Clinical - Red Word Triage >> Mar 01, 2024 11:46 AM Graeme ORN wrote: Red Word that prompted transfer to Nurse Triage: Shortness of breath, heaviness in chest, severe weakness, dizzy spell yesterday. Reason for Disposition  [1] MILD difficulty breathing (e.g., minimal/no SOB at rest, SOB with walking, pulse < 100) AND [2] NEW-onset or WORSE than normal  Answer Assessment - Initial Assessment Questions Pt states Saturday morning her son came over and then that night called her asking if she was feeling ok because he was feeling sick. Sunday she states she was more tired than normal. Got dizzy at church and some brought her home. She states that she slept a lot yesterday and has been having some sneezing. She thinks it's just her allergies. It happens every year around this time and states that it feels similar to past episodes. She states she takes mucinex and that helps. She states she is also has some chest heaviness but thinks its her reflux. She tried to rush RN off the phone saying she was happy she could call and ask these questions. RN advised would like her to be seen. She said she does not want to but will call back tomorrow if she's feeling the same and discuss it at that time.     1. RESPIRATORY STATUS: Describe your breathing? (e.g., wheezing, shortness of breath, unable to speak, severe coughing)      Slight shortness of breath- trelogy states it helps 2. ONSET: When did this breathing problem begin?       Yesterday morning sore throat 3. PATTERN Does the difficult breathing come and go, or has it been constant since it started?      intermittent 4. SEVERITY: How bad is your breathing? (e.g., mild, moderate, severe)      mild 5. RECURRENT SYMPTOM: Have you had difficulty breathing before? If Yes, ask: When was the last time? and What happened that time?      Shortness of breath with walking 6. CARDIAC HISTORY: Do you have any history of heart disease? (e.g., heart attack, angina, bypass surgery, angioplasty)      Yes, CHF 7. LUNG HISTORY: Do you have any history of lung disease?  (e.g., pulmonary embolus, asthma, emphysema)     Chronic bronchitis 8. CAUSE: What do you think is causing the breathing problem?      She thinks it's her allergies 9. OTHER SYMPTOMS: Do you have any other symptoms? (e.g., chest pain, cough, dizziness, fever, runny nose)     Heaviness, dizziness  Protocols used: Breathing Difficulty-A-AH

## 2024-03-02 ENCOUNTER — Ambulatory Visit (INDEPENDENT_AMBULATORY_CARE_PROVIDER_SITE_OTHER): Admitting: Sports Medicine

## 2024-03-02 ENCOUNTER — Encounter: Payer: Self-pay | Admitting: Sports Medicine

## 2024-03-02 VITALS — BP 124/82 | HR 72 | Temp 97.5°F | Ht 61.0 in | Wt 168.0 lb

## 2024-03-02 DIAGNOSIS — J069 Acute upper respiratory infection, unspecified: Secondary | ICD-10-CM | POA: Diagnosis not present

## 2024-03-02 DIAGNOSIS — J0100 Acute maxillary sinusitis, unspecified: Secondary | ICD-10-CM

## 2024-03-02 DIAGNOSIS — I5032 Chronic diastolic (congestive) heart failure: Secondary | ICD-10-CM | POA: Diagnosis not present

## 2024-03-02 LAB — POCT INFLUENZA A/B
Influenza A, POC: NEGATIVE
Influenza B, POC: NEGATIVE

## 2024-03-02 LAB — POC COVID19 BINAXNOW: SARS Coronavirus 2 Ag: NEGATIVE

## 2024-03-02 MED ORDER — BENZONATATE 200 MG PO CAPS: 200.0000 mg | ORAL_CAPSULE | Freq: Two times a day (BID) | ORAL | 0 refills | Status: AC | PRN

## 2024-03-02 MED ORDER — DOXYCYCLINE HYCLATE 100 MG PO TABS: 100.0000 mg | ORAL_TABLET | Freq: Two times a day (BID) | ORAL | 0 refills | Status: DC

## 2024-03-02 NOTE — Progress Notes (Signed)
 Careteam: Patient Care Team: Carla Harlene POUR, NP as PCP - General (Nurse Practitioner) Carla Lonni BIRCH, MD as PCP - Cardiology (Cardiology) Carla Roosevelt, MD as Consulting Physician (Pulmonary Disease)  PLACE OF SERVICE:  [facility]  Advanced Directive information    Allergies  Allergen Reactions   Wixela Inhub [Fluticasone -Salmeterol] Shortness Of Breath   Ace Inhibitors     cough   Amoxicillin Itching    REACTION: unknown? pain in right kidney   Benicar Hct [Olmesartan Medoxomil-Hctz]     Extreme weakness   Budesonide -Formoterol  Fumarate     Causes tremors and numbness   Chlorzoxazone Hives   Chlorzoxazone [Chlorzoxazone]    Citalopram Hydrobromide     REACTION: hives   Citalopram Hydrobromide    Clonidine Derivatives    Cymbalta  [Duloxetine  Hcl]    Droperidol     REACTION: hives   Flexeril [Cyclobenzaprine Hcl]    Fluconazole  Nausea And Vomiting and Other (See Comments)    fatigue   Formoterol  Itching   Gabapentin  Itching   Ketorolac  Tromethamine      REACTION: hives   Ketorolac  Tromethamine     Metoclopramide Hcl    Mometasone  Furo-Formoterol  Fum     Causes sore throat, blurred vision and unable to sleep--started on med 09-17-10   Mometasone  Furoate Itching   Morphine     REACTION: hives and itching   Olmesartan Medoxomil     REACTION: fatique   Breo Ellipta  [Fluticasone  Furoate-Vilanterol] Itching    Pt reports itching with Breo is related to being lactose intolerant.     Chief Complaint  Patient presents with   Acute Visit    Bad headache , SOB , Weakness ,sneezing  Patient has been using mucinex  Benadryl 3xs day  Meclizine       Discussed the use of AI scribe software for clinical note transcription with the patient, who gave verbal consent to proceed.  History of Present Illness  [name] is an 83 year old female with chronic heart failure who presents with cough and congestion    She reports dry cough and congestion with  gradual worsening underlying shortness of breath over the past two weeks. No wheezing is present. She uses Trelegy for respiratory issues and has not had a bronchitis attack in about a year. She also takes Lasix  for fluid management.   She uses Flonase  for allergies, which are typically worse in the fall. She reports sinus pain that started on Sunday and body aches but denies diarrhea, noting more constipation instead.    She has a history of chronic ear pain and recently visited an ear doctor who recommended hearing aids. She describes a 'bubble sound' in her ears but no drainage. She also reports itching around her jaw, neck, and chest, which she manages with lotion and over-the-counter medications.  She has a history of chronic heart failure. She notes that she has lost 18 pounds recently, which has helped with some of her symptoms.    Review of Systems:  Review of Systems  Constitutional:  Positive for malaise/fatigue. Negative for chills and fever.  HENT:  Positive for congestion and sinus pain. Negative for sore throat.   Respiratory:  Positive for cough and shortness of breath. Negative for sputum production.   Cardiovascular:  Negative for chest pain, palpitations and leg swelling.  Gastrointestinal:  Negative for abdominal pain, diarrhea, heartburn and nausea.  Genitourinary:  Negative for dysuria, frequency and hematuria.  Musculoskeletal:  Positive for myalgias. Negative for falls.  Neurological:  Positive for dizziness.   Negative unless indicated in HPI.   Past Medical History:  Diagnosis Date   (HFpEF) heart failure with preserved ejection fraction (HCC) 12/26/2021   Echocardiogram 12/2021: EF 55-60, no RWMA, Gr 2 DD, low normal RVSF, mildly elevated PASP (RVSP 38.5), mild LAE, trivial MR   ALLERGIC RHINITIS    Anxiety    Asthma    Blood type O+    Cervical strain, acute    Cirrhosis (HCC)    Colon polyp 2010   adenoma   Diverticulosis of colon    Dizziness     Esophageal varices (HCC)    Fibromyalgia    GERD (gastroesophageal reflux disease)    History of nephrolithiasis    Hypercholesterolemia    borderline   Hypertension    IBS (irritable bowel syndrome)    Osteoarthritis    Personal history of allergy  to unspecified medicinal agent    Postconcussion syndrome    Vitamin D  deficiency    Past Surgical History:  Procedure Laterality Date   CHOLECYSTECTOMY, LAPAROSCOPIC  2004   for gallstone pancreatitis   KNEE SURGERY Right    KNEE SURGERY Left    THYROID  SURGERY     cyst removed   Social History:   reports that she quit smoking about 61 years ago. Her smoking use included cigarettes. She started smoking about 69 years ago. She has a 16 pack-year smoking history. She has never used smokeless tobacco. She reports that she does not drink alcohol  and does not use drugs.  Family History  Problem Relation Age of Onset   Breast cancer Mother    Alzheimer's disease Mother    Ovarian cancer Sister    Heart disease Sister    Breast cancer Sister 78   Alcohol  abuse Sister    COPD Brother    Alcohol  abuse Brother    Ovarian cancer Paternal Grandmother    Heart attack Maternal Uncle    COPD Cousin        Maternal side    Arthritis Other        Cousins    Colon cancer Neg Hx    Esophageal cancer Neg Hx     Medications: Patient's Medications  New Prescriptions   No medications on file  Previous Medications   ACETAMINOPHEN  (TYLENOL ) 500 MG TABLET    Take 1,000 mg by mouth as needed.   ALBUTEROL  (PROAIR  HFA) 108 (90 BASE) MCG/ACT INHALER    INHALE 2 PUFFS EVERY 4 HOURS AS NEEDED INTO THE LUNGS FOR WHEEZING OR SHORTNESS OF BREATH J45.20   AMLODIPINE  (NORVASC ) 5 MG TABLET    TAKE 1 TABLET (5 MG TOTAL) BY MOUTH DAILY.   CETIRIZINE (ZYRTEC) 10 MG TABLET    Take 10 mg by mouth daily.   CHOLECALCIFEROL 50 MCG (2000 UT) CAPS    Take 2,000 Units by mouth in the morning and at bedtime.   CYANOCOBALAMIN  (VITAMIN B12) 1000 MCG TABLET    Take 1  tablet (1,000 mcg total) by mouth daily.   ELIQUIS  5 MG TABS TABLET    TAKE 1 TABLET BY MOUTH TWICE A DAY   ESOMEPRAZOLE  (NEXIUM ) 20 MG CAPSULE    TAKE 1 CAPSULE BY MOUTH EVERY DAY   EZETIMIBE  (ZETIA ) 10 MG TABLET    TAKE 1 TABLET BY MOUTH EVERY DAY   FLUTICASONE -UMECLIDIN-VILANT (TRELEGY ELLIPTA ) 100-62.5-25 MCG/ACT AEPB    Inhale 1 puff into the lungs daily.   FUROSEMIDE  (LASIX ) 20 MG TABLET    TAKE 1  TABLET BY MOUTH DAILY. PATIENT MUST CALL&SCHEDULE APPOINTMENT FOR FURTHER REFILLS   LOSARTAN  (COZAAR ) 25 MG TABLET    TAKE 1 TABLET BY MOUTH EVERY DAY   MECLIZINE  (ANTIVERT ) 12.5 MG TABLET    TAKE 1 TABLET BY MOUTH 3 TIMES DAILY AS NEEDED FOR DIZZINESS.   NYSTATIN  CREAM (MYCOSTATIN )    Apply 1 Application topically 2 (two) times daily.   OMEGA-3 FATTY ACIDS (FISH OIL MAXIMUM STRENGTH) 1200 MG CAPS    Take 1,200 mg by mouth 2 (two) times daily.    PROPRANOLOL  (INDERAL ) 10 MG TABLET    TAKE 1 TABLET BY MOUTH TWICE A DAY   PROPYLENE GLYCOL (SYSTANE BALANCE) 0.6 % SOLN    Place 2 drops into both eyes 2 (two) times daily.   SODIUM CHLORIDE  (OCEAN) 0.65 % NASAL SPRAY    Place 1 spray into the nose daily.  Modified Medications   No medications on file  Discontinued Medications   No medications on file    Physical Exam: Vitals:   03/02/24 1050  BP: 124/82  Pulse: 72  Temp: (!) 97.5 F (36.4 C)  SpO2: 96%  Weight: 168 lb (76.2 kg)  Height: 5' 1 (1.549 m)   Body mass index is 31.74 kg/m. BP Readings from Last 3 Encounters:  03/02/24 124/82  02/06/24 130/66  11/10/23 130/70   Wt Readings from Last 3 Encounters:  03/02/24 168 lb (76.2 kg)  02/06/24 173 lb (78.5 kg)  11/10/23 182 lb 3.2 oz (82.6 kg)    Physical Exam Constitutional:      Appearance: Normal appearance.  HENT:     Head: Normocephalic and atraumatic.     Right Ear: Tympanic membrane normal.     Left Ear: Tympanic membrane normal.     Nose: Congestion present.     Comments: Left maxillary sinus  tenderness Cardiovascular:     Rate and Rhythm: Normal rate and regular rhythm.  Pulmonary:     Effort: Pulmonary effort is normal. No respiratory distress.     Breath sounds: Normal breath sounds. No wheezing.  Abdominal:     General: Bowel sounds are normal. There is no distension.     Tenderness: There is no abdominal tenderness. There is no guarding or rebound.     Comments:    Musculoskeletal:        General: No swelling.  Neurological:     Mental Status: She is alert. Mental status is at baseline.     Motor: No weakness.     Labs reviewed: Basic Metabolic Panel: Recent Labs    11/10/23 1222  NA 141  K 4.9  CL 104  CO2 28  GLUCOSE 100*  BUN 21  CREATININE 0.68  CALCIUM  10.1   Liver Function Tests: Recent Labs    11/10/23 1222  AST 27  ALT 22  BILITOT 0.7  PROT 6.9   No results for input(s): LIPASE, AMYLASE in the last 8760 hours. No results for input(s): AMMONIA in the last 8760 hours. CBC: Recent Labs    11/10/23 1222  WBC 8.5  NEUTROABS 3,434  HGB 14.7  HCT 46.4*  MCV 91.7  PLT 286   Lipid Panel: Recent Labs    11/10/23 1222  CHOL 171  HDL 41*  LDLCALC 93  TRIG 755*  CHOLHDL 4.2   TSH: No results for input(s): TSH in the last 8760 hours. A1C: Lab Results  Component Value Date   HGBA1C 6.0 (H) 11/10/2023    Assessment and Plan Assessment &  Plan   1. Acute non-recurrent maxillary sinusitis  Maxillary sinus tenderness Will start doxycycline  - doxycycline  (VIBRA -TABS) 100 MG tablet; Take 1 tablet (100 mg total) by mouth 2 (two) times daily.  Dispense: 10 tablet; Refill: 0  2. URTI (acute upper respiratory infection) (Primary)  Flu, covid negative Will send tessalon  - benzonatate  (TESSALON ) 200 MG capsule; Take 1 capsule (200 mg total) by mouth 2 (two) times daily as needed for cough.  Dispense: 20 capsule; Refill: 0  3. Chronic heart failure with preserved ejection fraction (HFpEF) (HCC)  Lungs clear  No lower extremity  swelling Able to speak in full sentences Does not appear to be in distress O2 sat 96% Cont with lasix 

## 2024-03-15 ENCOUNTER — Encounter: Payer: Self-pay | Admitting: Nurse Practitioner

## 2024-03-15 ENCOUNTER — Ambulatory Visit: Payer: Self-pay | Admitting: Nurse Practitioner

## 2024-03-15 VITALS — BP 132/64 | HR 64 | Temp 97.7°F | Ht 61.0 in | Wt 166.0 lb

## 2024-03-15 DIAGNOSIS — E66811 Obesity, class 1: Secondary | ICD-10-CM | POA: Diagnosis not present

## 2024-03-15 DIAGNOSIS — Z6831 Body mass index (BMI) 31.0-31.9, adult: Secondary | ICD-10-CM

## 2024-03-15 DIAGNOSIS — R7303 Prediabetes: Secondary | ICD-10-CM | POA: Diagnosis not present

## 2024-03-15 DIAGNOSIS — I480 Paroxysmal atrial fibrillation: Secondary | ICD-10-CM

## 2024-03-15 DIAGNOSIS — K7469 Other cirrhosis of liver: Secondary | ICD-10-CM | POA: Diagnosis not present

## 2024-03-15 DIAGNOSIS — E661 Drug-induced obesity: Secondary | ICD-10-CM | POA: Diagnosis not present

## 2024-03-15 DIAGNOSIS — K219 Gastro-esophageal reflux disease without esophagitis: Secondary | ICD-10-CM

## 2024-03-15 DIAGNOSIS — I5032 Chronic diastolic (congestive) heart failure: Secondary | ICD-10-CM | POA: Diagnosis not present

## 2024-03-15 DIAGNOSIS — M170 Bilateral primary osteoarthritis of knee: Secondary | ICD-10-CM | POA: Diagnosis not present

## 2024-03-15 DIAGNOSIS — I10 Essential (primary) hypertension: Secondary | ICD-10-CM

## 2024-03-15 DIAGNOSIS — J309 Allergic rhinitis, unspecified: Secondary | ICD-10-CM | POA: Diagnosis not present

## 2024-03-15 DIAGNOSIS — J4520 Mild intermittent asthma, uncomplicated: Secondary | ICD-10-CM

## 2024-03-15 NOTE — Assessment & Plan Note (Signed)
 Improved control on trelegy Continue current regimen.

## 2024-03-15 NOTE — Assessment & Plan Note (Signed)
 weight loss achieved through diet and exercise. Reports improved energy and well-being. - Continue dietary and lifestyle modifications to achieve target weight

## 2024-03-15 NOTE — Assessment & Plan Note (Signed)
 Ongoing but stable, continues on tylenol  PRN

## 2024-03-15 NOTE — Assessment & Plan Note (Signed)
 Rate controlled, continues on propanolol with eliquis  for anticoagulation

## 2024-03-15 NOTE — Assessment & Plan Note (Signed)
 Continue dietary modifications

## 2024-03-15 NOTE — Assessment & Plan Note (Signed)
 Well controlled on nexium  20 mg daily  Continue medication with dietary modifications

## 2024-03-15 NOTE — Assessment & Plan Note (Signed)
 Chronic nasal congestion likely exacerbated by dust exposure. Previous Flonase  use caused epistaxis. Zyrtec and Benadryl caused adverse effects. - Discontinue Zyrtec and Benadryl. - Start Claritin (loratadine) 10 mg daily. - Perform nasal rinse daily. - Use saline spray as needed. - Avoid long-term Flonase  use. - Wear mask during dusting.

## 2024-03-15 NOTE — Assessment & Plan Note (Signed)
 Blood pressure well controlled, goal bp <140/90 Continue current medications and dietary modifications follow metabolic panel

## 2024-03-15 NOTE — Assessment & Plan Note (Signed)
 Euvolemic at this time, continues on lasix  20 mg daily with losartan  25 mg daily

## 2024-03-15 NOTE — Progress Notes (Signed)
 Careteam: Patient Care Team: [name], NP as PCP - General (Nurse Practitioner) Verlin Lonni BIRCH, MD as PCP - Cardiology (Cardiology) Theophilus Roosevelt, MD as Consulting Physician (Pulmonary Disease)  PLACE OF SERVICE:  [facility]  Advanced Directive information    Allergies  Allergen Reactions   Wixela Inhub [Fluticasone -Salmeterol] Shortness Of Breath   Ace Inhibitors     cough   Amoxicillin Itching    REACTION: unknown? pain in right kidney   Benicar Hct [Olmesartan Medoxomil-Hctz]     Extreme weakness   Budesonide -Formoterol  Fumarate     Causes tremors and numbness   Chlorzoxazone Hives   Chlorzoxazone [Chlorzoxazone]    Citalopram Hydrobromide     REACTION: hives   Citalopram Hydrobromide    Clonidine Derivatives    Cymbalta  [Duloxetine  Hcl]    Droperidol     REACTION: hives   Flexeril [Cyclobenzaprine Hcl]    Fluconazole  Nausea And Vomiting and Other (See Comments)    fatigue   Formoterol  Itching   Gabapentin  Itching   Ketorolac  Tromethamine      REACTION: hives   Ketorolac  Tromethamine     Metoclopramide Hcl    Mometasone  Furo-Formoterol  Fum     Causes sore throat, blurred vision and unable to sleep--started on med 09-17-10   Mometasone  Furoate Itching   Morphine     REACTION: hives and itching   Olmesartan Medoxomil     REACTION: fatique   Breo Ellipta  [Fluticasone  Furoate-Vilanterol] Itching    Pt reports itching with Breo is related to being lactose intolerant.     Chief Complaint  Patient presents with   Medical Management of Chronic Issues    4 month routine follow up  - Right ear in pain //pt feels like she's having sinus infection.     HPI:  Discussed the use of AI scribe software for clinical note transcription with the patient, who gave verbal consent to proceed.  History of Present Illness [name] is an 83 year old female with chronic sinus issues and allergies who presents for a four-month  follow-up.  Approximately six weeks ago, she was treated for sinus issues initially thought to be viral and was prescribed doxycycline , which improved her symptoms over nine days. After resuming household activities, she experienced a right earache and dizziness two nights ago, which she attributes to dust exposure now with more congestion.   She regularly uses sinus rinses and saline sprays but discontinued Flonase  due to severe nasal dryness and epistaxis, which required specialist intervention. She uses saline spray three times a day and plans to resume daily sinus rinses.  She has been experiencing persistent itching, initially thought to be medication-related. She has tried Zyrtec and Benadryl, both ineffective, with Benadryl causing constipation and tiredness.  She has a history of asthma triggered by bronchitis but reports no recent bronchitis episodes since starting Trelegy. She experiences no wheezing and has rare episodes of shortness of breath and chest heaviness.  She has lost weight and feels more energetic, attributing this to improved diet and exercise. She experiences weak spells and moments of feeling hot, which she associates with aging and possibly her heart condition. She uses a recliner to alleviate leg swelling, which has improved since using it.   Review of Systems:  Review of Systems  Constitutional:  Negative for chills, fever and weight loss.  HENT:  Positive for congestion. Negative for tinnitus.   Respiratory:  Negative for cough, sputum production and shortness of breath.   Cardiovascular:  Negative for chest pain, palpitations and leg swelling.  Gastrointestinal:  Negative for abdominal pain, constipation, diarrhea and heartburn.  Genitourinary:  Negative for dysuria, frequency and urgency.  Musculoskeletal:  Negative for back pain, falls, joint pain and myalgias.  Skin: Negative.   Neurological:  Negative for dizziness and headaches.  Psychiatric/Behavioral:   Negative for depression and memory loss. The patient does not have insomnia.     Past Medical History:  Diagnosis Date   (HFpEF) heart failure with preserved ejection fraction (HCC) 12/26/2021   Echocardiogram 12/2021: EF 55-60, no RWMA, Gr 2 DD, low normal RVSF, mildly elevated PASP (RVSP 38.5), mild LAE, trivial MR   ALLERGIC RHINITIS    Anxiety    Asthma    Blood type O+    Cervical strain, acute    Cirrhosis (HCC)    Colon polyp 2010   adenoma   Diverticulosis of colon    Dizziness    Esophageal varices (HCC)    Fibromyalgia    GERD (gastroesophageal reflux disease)    History of nephrolithiasis    Hypercholesterolemia    borderline   Hypertension    IBS (irritable bowel syndrome)    Osteoarthritis    Personal history of allergy  to unspecified medicinal agent    Postconcussion syndrome    Vitamin D  deficiency    Past Surgical History:  Procedure Laterality Date   CHOLECYSTECTOMY, LAPAROSCOPIC  2004   for gallstone pancreatitis   KNEE SURGERY Right    KNEE SURGERY Left    THYROID  SURGERY     cyst removed   Social History:   reports that she quit smoking about 61 years ago. Her smoking use included cigarettes. She started smoking about 69 years ago. She has a 16 pack-year smoking history. She has never used smokeless tobacco. She reports that she does not drink alcohol  and does not use drugs.  Family History  Problem Relation Age of Onset   Breast cancer Mother    Alzheimer's disease Mother    Ovarian cancer Sister    Heart disease Sister    Breast cancer Sister 24   Alcohol  abuse Sister    COPD Brother    Alcohol  abuse Brother    Ovarian cancer Paternal Grandmother    Heart attack Maternal Uncle    COPD Cousin        Maternal side    Arthritis Other        Cousins    Colon cancer Neg Hx    Esophageal cancer Neg Hx     Medications: Patient's Medications  New Prescriptions   No medications on file  Previous Medications   ACETAMINOPHEN  (TYLENOL ) 500 MG  TABLET    Take 1,000 mg by mouth as needed.   ALBUTEROL  (PROAIR  HFA) 108 (90 BASE) MCG/ACT INHALER    INHALE 2 PUFFS EVERY 4 HOURS AS NEEDED INTO THE LUNGS FOR WHEEZING OR SHORTNESS OF BREATH J45.20   AMLODIPINE  (NORVASC ) 5 MG TABLET    TAKE 1 TABLET (5 MG TOTAL) BY MOUTH DAILY.   BENZONATATE  (TESSALON ) 200 MG CAPSULE    Take 1 capsule (200 mg total) by mouth 2 (two) times daily as needed for cough.   CETIRIZINE (ZYRTEC) 10 MG TABLET    Take 10 mg by mouth daily.   CYANOCOBALAMIN  (VITAMIN B12) 1000 MCG TABLET    Take 1 tablet (1,000 mcg total) by mouth daily.   DOXYCYCLINE  (VIBRA -TABS) 100 MG TABLET    Take 1 tablet (100 mg total) by mouth 2 (  two) times daily.   ELIQUIS  5 MG TABS TABLET    TAKE 1 TABLET BY MOUTH TWICE A DAY   ESOMEPRAZOLE  (NEXIUM ) 20 MG CAPSULE    TAKE 1 CAPSULE BY MOUTH EVERY DAY   EZETIMIBE  (ZETIA ) 10 MG TABLET    TAKE 1 TABLET BY MOUTH EVERY DAY   FLUTICASONE -UMECLIDIN-VILANT (TRELEGY ELLIPTA ) 100-62.5-25 MCG/ACT AEPB    Inhale 1 puff into the lungs daily.   FUROSEMIDE  (LASIX ) 20 MG TABLET    TAKE 1 TABLET BY MOUTH DAILY. PATIENT MUST CALL&SCHEDULE APPOINTMENT FOR FURTHER REFILLS   LOSARTAN  (COZAAR ) 25 MG TABLET    TAKE 1 TABLET BY MOUTH EVERY DAY   MECLIZINE  (ANTIVERT ) 12.5 MG TABLET    TAKE 1 TABLET BY MOUTH 3 TIMES DAILY AS NEEDED FOR DIZZINESS.   OMEGA-3 FATTY ACIDS (FISH OIL MAXIMUM STRENGTH) 1200 MG CAPS    Take 1,200 mg by mouth 2 (two) times daily.    PROPRANOLOL  (INDERAL ) 10 MG TABLET    TAKE 1 TABLET BY MOUTH TWICE A DAY   PROPYLENE GLYCOL (SYSTANE BALANCE) 0.6 % SOLN    Place 2 drops into both eyes 2 (two) times daily.   SODIUM CHLORIDE  (OCEAN) 0.65 % NASAL SPRAY    Place 1 spray into the nose daily.  Modified Medications   No medications on file  Discontinued Medications   No medications on file    Physical Exam:  Vitals:   03/15/24 0948  BP: 132/64  Pulse: 64  Temp: 97.7 F (36.5 C)  SpO2: 96%  Weight: 166 lb (75.3 kg)  Height: 5' 1 (1.549 m)    Body mass index is 31.37 kg/m. Wt Readings from Last 3 Encounters:  03/15/24 166 lb (75.3 kg)  03/02/24 168 lb (76.2 kg)  02/06/24 173 lb (78.5 kg)    Physical Exam Constitutional:      General: She is not in acute distress.    Appearance: She is well-developed. She is not diaphoretic.  HENT:     Head: Normocephalic and atraumatic.     Mouth/Throat:     Pharynx: No oropharyngeal exudate.  Eyes:     Conjunctiva/sclera: Conjunctivae normal.     Pupils: Pupils are equal, round, and reactive to light.  Cardiovascular:     Rate and Rhythm: Normal rate and regular rhythm.     Heart sounds: Normal heart sounds.  Pulmonary:     Effort: Pulmonary effort is normal.     Breath sounds: Normal breath sounds.  Abdominal:     General: Bowel sounds are normal.     Palpations: Abdomen is soft.  Musculoskeletal:     Cervical back: Normal range of motion and neck supple.     Right lower leg: No edema.     Left lower leg: No edema.  Skin:    General: Skin is warm and dry.  Neurological:     Mental Status: She is alert.  Psychiatric:        Mood and Affect: Mood normal.     Labs reviewed: Basic Metabolic Panel: Recent Labs    11/10/23 1222  NA 141  K 4.9  CL 104  CO2 28  GLUCOSE 100*  BUN 21  CREATININE 0.68  CALCIUM  10.1   Liver Function Tests: Recent Labs    11/10/23 1222  AST 27  ALT 22  BILITOT 0.7  PROT 6.9   No results for input(s): LIPASE, AMYLASE in the last 8760 hours. No results for input(s): AMMONIA in the last 8760 hours.  CBC: Recent Labs    11/10/23 1222  WBC 8.5  NEUTROABS 3,434  HGB 14.7  HCT 46.4*  MCV 91.7  PLT 286   Lipid Panel: Recent Labs    11/10/23 1222  CHOL 171  HDL 41*  LDLCALC 93  TRIG 755*  CHOLHDL 4.2   TSH: No results for input(s): TSH in the last 8760 hours. A1C: Lab Results  Component Value Date   HGBA1C 6.0 (H) 11/10/2023     Assessment/Plan  Essential hypertension Assessment & Plan: Blood  pressure well controlled, goal bp <140/90 Continue current medications and dietary modifications follow metabolic panel   Prediabetes Assessment & Plan: Continue dietary modifications   Chronic heart failure with preserved ejection fraction (HFpEF) (HCC) Assessment & Plan: Euvolemic at this time, continues on lasix  20 mg daily with losartan  25 mg daily    Paroxysmal atrial fibrillation (HCC) Assessment & Plan: Rate controlled, continues on propanolol with eliquis  for anticoagulation     Primary osteoarthritis of both knees Assessment & Plan: Ongoing but stable, continues on tylenol  PRN   Other cirrhosis of liver (HCC) Assessment & Plan: Stable, continue to follow labs Continues on propranolol  BID   Class 1 drug-induced obesity with serious comorbidity and body mass index (BMI) of 31.0 to 31.9 in adult Assessment & Plan: weight loss achieved through diet and exercise. Reports improved energy and well-being. - Continue dietary and lifestyle modifications to achieve target weight    Gastroesophageal reflux disease without esophagitis Assessment & Plan: Well controlled on nexium  20 mg daily  Continue medication with dietary modifications   Allergic rhinitis, unspecified seasonality, unspecified trigger Assessment & Plan: Chronic nasal congestion likely exacerbated by dust exposure. Previous Flonase  use caused epistaxis. Zyrtec and Benadryl caused adverse effects. - Discontinue Zyrtec and Benadryl. - Start Claritin (loratadine) 10 mg daily. - Perform nasal rinse daily. - Use saline spray as needed. - Avoid long-term Flonase  use. - Wear mask during dusting.   Asthma, allergic, mild intermittent, uncomplicated Assessment & Plan: Improved control on trelegy Continue current regimen.        Return in about 3 months (around 06/15/2024) for routine follow up.: labs at time of visit.   Brettney Ficken K. Caro BODILY [facility] [phone]

## 2024-03-15 NOTE — Patient Instructions (Addendum)
 Stop benadryl and zyrtec   To take Loratadine generic Claritin 10 mg by mouth daily for allergies.  Restart netty pot rinse daily   Pule Ox to monitor oxygen  level

## 2024-03-15 NOTE — Assessment & Plan Note (Signed)
 Stable, continue to follow labs Continues on propranolol  BID

## 2024-03-25 DIAGNOSIS — I5032 Chronic diastolic (congestive) heart failure: Secondary | ICD-10-CM | POA: Diagnosis not present

## 2024-03-25 DIAGNOSIS — G629 Polyneuropathy, unspecified: Secondary | ICD-10-CM | POA: Diagnosis not present

## 2024-03-29 ENCOUNTER — Telehealth: Payer: Self-pay

## 2024-03-29 ENCOUNTER — Other Ambulatory Visit: Payer: Self-pay

## 2024-03-29 DIAGNOSIS — I480 Paroxysmal atrial fibrillation: Secondary | ICD-10-CM

## 2024-03-29 MED ORDER — APIXABAN 5 MG PO TABS: 5.0000 mg | ORAL_TABLET | Freq: Two times a day (BID) | ORAL | 0 refills | Status: DC

## 2024-03-29 NOTE — Telephone Encounter (Signed)
 Prescription refill request for Eliquis  received. Indication:afib Last office visit:needs appt Scr: Age:  Weight:  Prescription refilled

## 2024-03-29 NOTE — Telephone Encounter (Signed)
Needs cardiology appt

## 2024-04-01 DIAGNOSIS — L853 Xerosis cutis: Secondary | ICD-10-CM | POA: Diagnosis not present

## 2024-04-01 DIAGNOSIS — Z85828 Personal history of other malignant neoplasm of skin: Secondary | ICD-10-CM | POA: Diagnosis not present

## 2024-04-01 DIAGNOSIS — L308 Other specified dermatitis: Secondary | ICD-10-CM | POA: Diagnosis not present

## 2024-04-15 ENCOUNTER — Telehealth: Payer: Self-pay

## 2024-04-15 MED ORDER — KETOCONAZOLE 2 % EX SHAM: 1.0000 | MEDICATED_SHAMPOO | CUTANEOUS | 0 refills | Status: DC

## 2024-04-15 NOTE — Telephone Encounter (Signed)
 Copied from CRM [id number]. Topic: Clinical - Medication Question >> Apr 14, 2024  4:54 PM Chiquita SQUIBB wrote: Reason for CRM: Patient is calling in stating that Harlene previously ordered her a cream for the itching and it helped a lot but stated her scalp is very itching now and the pharmacy told her that that have a shampoo version of the cream for extreme itching on the scalp. Please advise the patient if this is something Harlene can order.  Message sent to [name], NP

## 2024-04-15 NOTE — Addendum Note (Signed)
 Addended by: Sireen Halk K on: 04/15/2024 07:34 PM   Modules accepted: Orders

## 2024-04-15 NOTE — Telephone Encounter (Signed)
 Rx sent for ketoconazole shampoo- to use twice weekly.

## 2024-04-16 NOTE — Telephone Encounter (Signed)
 Left a detail voicemail message for the patient to let the patient know that the medication the she requested has been sent to the pharmacy for pickup.   Please [id number] the information with the patient from the provider. If they question or concerns please to call the office

## 2024-04-24 DIAGNOSIS — G629 Polyneuropathy, unspecified: Secondary | ICD-10-CM | POA: Diagnosis not present

## 2024-04-24 DIAGNOSIS — I5032 Chronic diastolic (congestive) heart failure: Secondary | ICD-10-CM | POA: Diagnosis not present

## 2024-04-27 ENCOUNTER — Other Ambulatory Visit: Payer: Self-pay | Admitting: Cardiovascular Disease

## 2024-04-27 DIAGNOSIS — I480 Paroxysmal atrial fibrillation: Secondary | ICD-10-CM

## 2024-04-27 NOTE — Telephone Encounter (Signed)
 Prescription refill request for Eliquis  received. Indication:afib Last office visit:needs appt Scr: Age:  Weight:  Prescription refilled

## 2024-05-10 ENCOUNTER — Other Ambulatory Visit: Payer: Self-pay | Admitting: Cardiovascular Disease

## 2024-05-10 DIAGNOSIS — I480 Paroxysmal atrial fibrillation: Secondary | ICD-10-CM

## 2024-05-10 NOTE — Telephone Encounter (Signed)
 Prescription refill request for Eliquis  received. Indication:AFIB Last office visit:upcoming Scr: 0.68  6/25 Age:83 Weight:75.3  kg  Prescription refilled

## 2024-05-13 ENCOUNTER — Other Ambulatory Visit: Payer: Self-pay | Admitting: Cardiovascular Disease

## 2024-05-13 ENCOUNTER — Other Ambulatory Visit: Payer: Self-pay | Admitting: Nurse Practitioner

## 2024-05-13 DIAGNOSIS — K219 Gastro-esophageal reflux disease without esophagitis: Secondary | ICD-10-CM

## 2024-05-13 DIAGNOSIS — E782 Mixed hyperlipidemia: Secondary | ICD-10-CM

## 2024-05-23 ENCOUNTER — Other Ambulatory Visit: Payer: Self-pay | Admitting: Sports Medicine

## 2024-05-23 ENCOUNTER — Other Ambulatory Visit: Payer: Self-pay | Admitting: Nurse Practitioner

## 2024-05-23 DIAGNOSIS — J069 Acute upper respiratory infection, unspecified: Secondary | ICD-10-CM

## 2024-05-23 DIAGNOSIS — J0100 Acute maxillary sinusitis, unspecified: Secondary | ICD-10-CM

## 2024-06-07 NOTE — Progress Notes (Unsigned)
 " Cardiology Office Note:    Date:  06/07/2024   ID:  [name], DOB 1940/05/14, MRN [id number]  PCP:  [name], NP   [facility] Providers Cardiologist:  Lonni Cash, MD { Click to update primary MD,subspecialty MD or APP then REFRESH:1}    Referring MD: [name], NP   Chief complaint: Follow-up of chronic cardiovascular conditions     History of Present Illness:   [name] is a 84 y.o. female with a hx of chronic diastolic CHF, paroxysmal atrial fibrillation, anxiety, asthma, fibromyalgia, GERD, HTN, HLD, IBS and cirrhosis secondary to NASH with esophageal varices here today for cardiac follow up.   Followed by Dr. Cash as a new patient in September 2021 for management of atrial fibrillation. She was admitted to [facility] in July 2021 with fever and UTI and was found to have atrial fibrillation. She was also treated for C diff. She was started on Eliquis  and her beta blocker was continued. Echo 11/23/19 with LVEF=60-65%. No significant valve disease. She was in sinus at her office visit in September 2021 and in March 2022. She was seen in our office August 2023 with c/o chest pain and dyspnea. She was started on Lasix  20 mg daily. Echo in August 2023 with LVEF=55-60%. No valve disease. Her dyspnea has been felt to be related to her underlying lung disease.   Most recently seen by Dr. Cash 11/2022, she had complaints of fatigue at that time which was not thought to be related to cardiovascular disease.  Atrial fibrillation  Chronic diastolic CHF  HTN  Fatigue  ROS:   Please see the history of present illness.    *** All other systems reviewed and are negative.     Past Medical History:  Diagnosis Date   (HFpEF) heart failure with preserved ejection fraction (HCC) 12/26/2021   Echocardiogram 12/2021: EF 55-60, no RWMA, Gr 2 DD, low normal RVSF, mildly elevated PASP (RVSP 38.5), mild LAE, trivial MR   ALLERGIC RHINITIS     Anxiety    Asthma    Blood type O+    Cervical strain, acute    Cirrhosis (HCC)    Colon polyp 2010   adenoma   Diverticulosis of colon    Dizziness    Esophageal varices (HCC)    Fibromyalgia    GERD (gastroesophageal reflux disease)    History of nephrolithiasis    Hypercholesterolemia    borderline   Hypertension    IBS (irritable bowel syndrome)    Osteoarthritis    Personal history of allergy  to unspecified medicinal agent    Postconcussion syndrome    Vitamin D  deficiency     Past Surgical History:  Procedure Laterality Date   CHOLECYSTECTOMY, LAPAROSCOPIC  2004   for gallstone pancreatitis   KNEE SURGERY Right    KNEE SURGERY Left    THYROID  SURGERY     cyst removed    Current Medications: Active Medications[1]   Allergies:   Wixela inhub [fluticasone -salmeterol], Ace inhibitors, Amoxicillin, Benicar hct [olmesartan medoxomil-hctz], Budesonide -formoterol  fumarate, Chlorzoxazone, Chlorzoxazone [chlorzoxazone], Citalopram hydrobromide, Citalopram hydrobromide, Clonidine and derivatives, Cymbalta  [duloxetine  hcl], Droperidol, Flexeril [cyclobenzaprine hcl], Fluconazole , Formoterol , Gabapentin , Ketorolac  tromethamine , Ketorolac  tromethamine , Metoclopramide hcl, Mometasone  furo-formoterol  fum, Mometasone  furoate, Morphine, Olmesartan medoxomil, and Breo ellipta  [fluticasone  furoate-vilanterol]   Social History   Socioeconomic History   Marital status: Divorced    Spouse name: Not on file   Number of children: 1   Years of education: Not on file  Highest education level: Not on file  Occupational History   Occupation: retired    Associate Professor: [employer]    Comment: comptroller  Tobacco Use   Smoking status: Former    Current packs/day: 0.00    Average packs/day: 2.0 packs/day for 8.0 years (16.0 ttl pk-yrs)    Types: Cigarettes    Start date: 05/13/1954    Quit date: 05/13/1962    Years since quitting: 62.1   Smokeless tobacco:  Never  Vaping Use   Vaping status: Never Used  Substance and Sexual Activity   Alcohol  use: No   Drug use: No   Sexual activity: Not Currently    Birth control/protection: Post-menopausal  Other Topics Concern   Not on file  Social History Narrative   exercises 3x a week   drinks 1 cup caffeine every other day   Social Drivers of Health   Tobacco Use: Medium Risk (03/15/2024)   Patient History    Smoking Tobacco Use: Former    Smokeless Tobacco Use: Never    Passive Exposure: Not on Actuary Strain: Not on file  Food Insecurity: No Food Insecurity (02/06/2024)   Epic    Worried About Programme Researcher, Broadcasting/film/video in the Last Year: Never true    Ran Out of Food in the Last Year: Never true  Transportation Needs: No Transportation Needs (02/06/2024)   Epic    Lack of Transportation (Medical): No    Lack of Transportation (Non-Medical): No  Physical Activity: Not on file  Stress: Not on file  Social Connections: Moderately Isolated (02/06/2024)   Social Connection and Isolation Panel    Frequency of Communication with Friends and Family: More than three times a week    Frequency of Social Gatherings with Friends and Family: More than three times a week    Attends Religious Services: More than 4 times per year    Active Member of [employer] or Organizations: No    Attends Banker Meetings: Never    Marital Status: Divorced  Depression (PHQ2-9): Low Risk (03/15/2024)   Depression (PHQ2-9)    PHQ-2 Score: 0  Alcohol  Screen: Not on file  Housing: Low Risk (02/06/2024)   Epic    Unable to Pay for Housing in the Last Year: No    Number of Times Moved in the Last Year: 0    Homeless in the Last Year: No  Utilities: Not At Risk (02/06/2024)   Epic    Threatened with loss of utilities: No  Health Literacy: Not on file     Family History: The patient's ***family history includes Alcohol  abuse in her brother and sister; Alzheimer's disease in her mother; Arthritis in  an other family member; Breast cancer in her mother; Breast cancer (age of onset: 65) in her sister; COPD in her brother and cousin; Heart attack in her maternal uncle; Heart disease in her sister; Ovarian cancer in her paternal grandmother and sister. There is no history of Colon cancer or Esophageal cancer.  EKGs/Labs/Other Studies Reviewed:    The following studies were reviewed today: ***      Recent Labs: 11/10/2023: ALT 22; BUN 21; Creat 0.68; Hemoglobin 14.7; Platelets 286; Potassium 4.9; Sodium 141  Recent Lipid Panel    Component Value Date/Time   CHOL 171 11/10/2023 1222   CHOL 217 (H) 11/10/2015 0903   TRIG 244 (H) 11/10/2023 1222   HDL 41 (L) 11/10/2023 1222   HDL 44 11/10/2015 0903  CHOLHDL 4.2 11/10/2023 1222   VLDL 27 12/31/2016 0851   LDLCALC 93 11/10/2023 1222   LDLDIRECT 102.5 07/02/2012 1100     Risk Assessment/Calculations:   {Does this patient have ATRIAL FIBRILLATION?:[id number]}  No BP recorded.  {Refresh Note OR Click here to enter BP  :1}***         Physical Exam:    VS:  There were no vitals taken for this visit.       Wt Readings from Last 3 Encounters:  03/15/24 166 lb (75.3 kg)  03/02/24 168 lb (76.2 kg)  02/06/24 173 lb (78.5 kg)     GEN: *** Well nourished, well developed in no acute distress HEENT: Normal NECK: No JVD; No carotid bruits CARDIAC: *** S1-S2 normal, RRR, no murmurs, rubs, gallops RESPIRATORY:  Clear to auscultation without rales, wheezing or rhonchi  MUSCULOSKELETAL:  No edema; No deformity  SKIN: Warm and dry NEUROLOGIC:  Alert and oriented x 3 PSYCHIATRIC:  Normal affect       Assessment & Plan   Disposition: *** Route to primary cardiologist      {Are you ordering a CV Procedure (e.g. stress test, cath, DCCV, TEE, etc)?   Press F2        :[id number]}    Medication Adjustments/Labs and Tests Ordered: Current medicines are reviewed at length with the patient today.  Concerns regarding medicines are  outlined above.  No orders of the defined types were placed in this encounter.  No orders of the defined types were placed in this encounter.   There are no Patient Instructions on file for this visit.   Signed, Rhian Funari E Ouida Abeyta, NP  06/07/2024 9:59 PM    [facility]    [1]  No outpatient medications have been marked as taking for the 06/09/24 encounter (Appointment) with Laterria Lasota E, NP.   "

## 2024-06-09 ENCOUNTER — Ambulatory Visit: Admitting: Emergency Medicine

## 2024-06-18 ENCOUNTER — Ambulatory Visit: Admitting: Nurse Practitioner

## 2024-06-18 ENCOUNTER — Encounter: Payer: Self-pay | Admitting: Nurse Practitioner

## 2024-06-18 VITALS — BP 138/70 | HR 92 | Temp 97.3°F | Ht 61.0 in | Wt 146.8 lb

## 2024-06-18 DIAGNOSIS — J4520 Mild intermittent asthma, uncomplicated: Secondary | ICD-10-CM

## 2024-06-18 DIAGNOSIS — K219 Gastro-esophageal reflux disease without esophagitis: Secondary | ICD-10-CM

## 2024-06-18 DIAGNOSIS — R5383 Other fatigue: Secondary | ICD-10-CM

## 2024-06-18 DIAGNOSIS — I10 Essential (primary) hypertension: Secondary | ICD-10-CM

## 2024-06-18 DIAGNOSIS — E782 Mixed hyperlipidemia: Secondary | ICD-10-CM

## 2024-06-18 DIAGNOSIS — R7303 Prediabetes: Secondary | ICD-10-CM

## 2024-06-18 DIAGNOSIS — I5032 Chronic diastolic (congestive) heart failure: Secondary | ICD-10-CM

## 2024-06-18 LAB — CBC WITH DIFFERENTIAL/PLATELET
Absolute Lymphocytes: 4269 {cells}/uL — ABNORMAL HIGH (ref 850–3900)
Absolute Monocytes: 660 {cells}/uL (ref 200–950)
Basophils Absolute: 47 {cells}/uL (ref 0–200)
Basophils Relative: 0.5 %
Eosinophils Absolute: 195 {cells}/uL (ref 15–500)
Eosinophils Relative: 2.1 %
HCT: 46.9 % — ABNORMAL HIGH (ref 35.9–46.0)
Hemoglobin: 15.5 g/dL (ref 11.7–15.5)
MCH: 29.9 pg (ref 27.0–33.0)
MCHC: 33 g/dL (ref 31.6–35.4)
MCV: 90.5 fL (ref 81.4–101.7)
MPV: 10.9 fL (ref 7.5–12.5)
Monocytes Relative: 7.1 %
Neutro Abs: 4129 {cells}/uL (ref 1500–7800)
Neutrophils Relative %: 44.4 %
Platelets: 299 10*3/uL (ref 140–400)
RBC: 5.18 Million/uL — ABNORMAL HIGH (ref 3.80–5.10)
RDW: 12.6 % (ref 11.0–15.0)
Total Lymphocyte: 45.9 %
WBC: 9.3 10*3/uL (ref 3.8–10.8)

## 2024-06-18 MED ORDER — ALBUTEROL SULFATE HFA 108 (90 BASE) MCG/ACT IN AERS: INHALATION_SPRAY | RESPIRATORY_TRACT | 5 refills | Status: AC

## 2024-06-18 NOTE — Progress Notes (Unsigned)
 "  Location:  [facility]  Provider: [name], NP  Goals of Care:     02/06/2024    9:34 AM  Advanced Directives  Does Patient Have a Medical Advance Directive? Yes  Type of Advance Directive Out of facility DNR (pink MOST or yellow form)  Does patient want to make changes to medical advance directive? No - Patient declined     Chief Complaint  Patient presents with   medication management of chronic issues    3 month follow up     HPI: Patient is a 84 y.o. female seen today for medical management of chronic diseases.    Pt seen for follow up, hx significant for HFpER, hypertension, asthmatic bronchitis, GERD, prediabetes, osteoarthritis, and fatigue.  Pt endorses weight loss, 38 lb.; states improvement in overall health; endorses improvement in arthritis symptoms, state she has only had to use tylenol  rarely. States improvement in bronchitis, denies any acute episodes recently; states no reflux symptoms, denies ShOB, chest pain, LE edema;   Pt states Fall last week; denies any symptoms, denies LOC, injury; not on blood thinners; states mobility has improved overall since weight loss.   Pt states concern for lice; states head has been itching for a while. States improved symptoms of skin itching since derm visit, but shampoo has been less effective for scalp pruritis.   Pt also states her lips are blue in the morning; endorses recent episodes of fatigue.  A&Ox3  ***   Past Medical History:  Diagnosis Date   (HFpEF) heart failure with preserved ejection fraction (HCC) 12/26/2021   Echocardiogram 12/2021: EF 55-60, no RWMA, Gr 2 DD, low normal RVSF, mildly elevated PASP (RVSP 38.5), mild LAE, trivial MR   ALLERGIC RHINITIS    Anxiety    Asthma    Blood type O+    Cervical strain, acute    Cirrhosis (HCC)    Colon polyp 2010   adenoma   Diverticulosis of colon    Dizziness    Esophageal varices (HCC)    Fibromyalgia    GERD (gastroesophageal reflux  disease)    History of nephrolithiasis    Hypercholesterolemia    borderline   Hypertension    IBS (irritable bowel syndrome)    Osteoarthritis    Personal history of allergy  to unspecified medicinal agent    Postconcussion syndrome    Vitamin D  deficiency     Past Surgical History:  Procedure Laterality Date   CHOLECYSTECTOMY, LAPAROSCOPIC  2004   for gallstone pancreatitis   KNEE SURGERY Right    KNEE SURGERY Left    THYROID  SURGERY     cyst removed    Allergies[1]  Allergies as of 06/18/2024       Reactions   Wixela Inhub [fluticasone -salmeterol] Shortness Of Breath   Ace Inhibitors    cough   Amoxicillin Itching   REACTION: unknown? pain in right kidney   Benicar Hct [olmesartan Medoxomil-hctz]    Extreme weakness   Budesonide -formoterol  Fumarate    Causes tremors and numbness   Chlorzoxazone Hives   Chlorzoxazone [chlorzoxazone]    Citalopram Hydrobromide    REACTION: hives   Citalopram Hydrobromide    Clonidine And Derivatives    Cymbalta  [duloxetine  Hcl]    Droperidol    REACTION: hives   Flexeril [cyclobenzaprine Hcl]    Fluconazole  Nausea And Vomiting, Other (See Comments)   fatigue   Formoterol  Itching   Gabapentin  Itching   Ketorolac  Tromethamine     REACTION: hives  Ketorolac  Tromethamine     Metoclopramide Hcl    Mometasone  Furo-formoterol  Fum    Causes sore throat, blurred vision and unable to sleep--started on med 09-17-10   Mometasone  Furoate Itching   Morphine    REACTION: hives and itching   Olmesartan Medoxomil    REACTION: fatique   Breo Ellipta  [fluticasone  Furoate-vilanterol] Itching   Pt reports itching with Breo is related to being lactose intolerant.         Medication List        Accurate as of June 18, 2024 11:46 AM. If you have any questions, ask your nurse or doctor.          acetaminophen  500 MG tablet Commonly known as: TYLENOL  Take 1,000 mg by mouth as needed.   albuterol  108 (90 Base) MCG/ACT  inhaler Commonly known as: ProAir  HFA INHALE 2 PUFFS EVERY 4 HOURS AS NEEDED INTO THE LUNGS FOR WHEEZING OR SHORTNESS OF BREATH J45.20   amLODipine  5 MG tablet Commonly known as: NORVASC  TAKE 1 TABLET (5 MG TOTAL) BY MOUTH DAILY.   apixaban  5 MG Tabs tablet Commonly known as: Eliquis  Take 1 tablet (5 mg total) by mouth 2 (two) times daily.   benzonatate  200 MG capsule Commonly known as: TESSALON  Take 1 capsule (200 mg total) by mouth 2 (two) times daily as needed for cough.   cetirizine 10 MG tablet Commonly known as: ZYRTEC Take 10 mg by mouth daily.   cyanocobalamin  1000 MCG tablet Commonly known as: VITAMIN B12 Take 1 tablet (1,000 mcg total) by mouth daily.   esomeprazole  20 MG capsule Commonly known as: NEXIUM  TAKE 1 CAPSULE BY MOUTH EVERY DAY   ezetimibe  10 MG tablet Commonly known as: ZETIA  TAKE 1 TABLET BY MOUTH EVERY DAY   Fish Oil Maximum Strength 1200 MG Caps Take 1,200 mg by mouth 2 (two) times daily.   furosemide  20 MG tablet Commonly known as: LASIX  Take 1 tablet (20 mg total) by mouth daily.   ketoconazole  2 % shampoo Commonly known as: NIZORAL  APPLY 1 APPLICATION TOPICALLY 2 (TWO) TIMES A WEEK.   losartan  25 MG tablet Commonly known as: COZAAR  TAKE 1 TABLET BY MOUTH EVERY DAY   meclizine  12.5 MG tablet Commonly known as: ANTIVERT  TAKE 1 TABLET BY MOUTH 3 TIMES DAILY AS NEEDED FOR DIZZINESS.   propranolol  10 MG tablet Commonly known as: INDERAL  TAKE 1 TABLET BY MOUTH TWICE A DAY   sodium chloride  0.65 % nasal spray Commonly known as: OCEAN Place 1 spray into the nose daily.   Systane Balance 0.6 % Soln Generic drug: Propylene Glycol Place 2 drops into both eyes 2 (two) times daily.   Trelegy Ellipta  100-62.5-25 MCG/ACT Aepb Generic drug: Fluticasone -Umeclidin-Vilant Inhale 1 puff into the lungs daily.        Review of Systems:  Review of Systems  Constitutional:  Positive for fatigue. Negative for activity change.  HENT:  Negative.    Eyes: Negative.   Respiratory:  Negative for chest tightness and shortness of breath.   Cardiovascular:  Negative for chest pain and leg swelling.  Gastrointestinal:  Negative for abdominal distention, diarrhea, nausea and vomiting.  Endocrine: Negative.   Genitourinary: Negative.   Musculoskeletal: Negative.   Skin: Negative.   Allergic/Immunologic: Negative.   Neurological: Negative.   Hematological: Negative.   Psychiatric/Behavioral:  Negative for agitation and confusion.   ***  Health Maintenance  Topic Date Due   COVID-19 Vaccine (7 - 2025-26 season) 08/13/2024   Medicare Annual Wellness (AWV)  02/05/2025   DTaP/Tdap/Td (3 - Td or Tdap) 02/12/2034   Pneumococcal Vaccine: 50+ Years  Completed   Influenza Vaccine  Completed   Bone Density Scan  Completed   Zoster Vaccines- Shingrix  Completed   Meningococcal B Vaccine  Aged Out   Colonoscopy  Discontinued    Physical Exam: Vitals:   06/18/24 1113 06/18/24 1115  BP: (!) 140/72 138/70  Pulse: 92   Temp: (!) 97.3 F (36.3 C)   SpO2: 96%   Weight: 146 lb 12.8 oz (66.6 kg)   Height: 5' 1 (1.549 m)    Body mass index is 27.74 kg/m. Physical Exam Constitutional:      Appearance: Normal appearance.  HENT:     Head: Normocephalic.     Right Ear: Tympanic membrane, ear canal and external ear normal.     Left Ear: Tympanic membrane, ear canal and external ear normal.     Nose: Nose normal.     Mouth/Throat:     Mouth: Mucous membranes are moist.  Cardiovascular:     Rate and Rhythm: Normal rate.     Pulses: Normal pulses.     Heart sounds: Normal heart sounds.     Comments: Systolic murmur noted, aortic Pulmonary:     Effort: Pulmonary effort is normal.     Breath sounds: Normal breath sounds.  Abdominal:     Palpations: Abdomen is soft.  Musculoskeletal:        General: No swelling.  Skin:    General: Skin is warm.     Capillary Refill: Capillary refill takes less than 2 seconds.   Neurological:     Mental Status: She is alert and oriented to person, place, and time. Mental status is at baseline.  Psychiatric:        Mood and Affect: Mood normal.        Behavior: Behavior normal.   ***  Labs reviewed: Basic Metabolic Panel: Recent Labs    11/10/23 1222  NA 141  K 4.9  CL 104  CO2 28  GLUCOSE 100*  BUN 21  CREATININE 0.68  CALCIUM  10.1   Liver Function Tests: Recent Labs    11/10/23 1222  AST 27  ALT 22  BILITOT 0.7  PROT 6.9   No results for input(s): LIPASE, AMYLASE in the last 8760 hours. No results for input(s): AMMONIA in the last 8760 hours. CBC: Recent Labs    11/10/23 1222  WBC 8.5  NEUTROABS 3,434  HGB 14.7  HCT 46.4*  MCV 91.7  PLT 286   Lipid Panel: Recent Labs    11/10/23 1222  CHOL 171  HDL 41*  LDLCALC 93  TRIG 755*  CHOLHDL 4.2   Lab Results  Component Value Date   HGBA1C 6.0 (H) 11/10/2023    Procedures since last visit: No results found.  Assessment/Plan 1. Asthma, allergic, mild intermittent, uncomplicated - chronic asthmatic bronchitis well managed on current therapy, continue and report any new symtpoms - albuterol  (PROAIR  HFA) 108 (90 Base) MCG/ACT inhaler; INHALE 2 PUFFS EVERY 4 HOURS AS NEEDED INTO THE LUNGS FOR WHEEZING OR SHORTNESS OF BREATH J45.20  Dispense: 25.5 g; Refill: 5  2. Chronic heart failure with preserved ejection fraction (HFpEF) (HCC) (Primary) - no LE edema noted, recent fatigue noted, referral to pulmonology for possible OSA - aortic stenosis noted on previous echo, continue to monitor for new onset chest pain, dyspnea on exertion  3. Gastroesophageal reflux disease without esophagitis - symptoms well managed on current therapy,  continue and report any new symptoms  4. Prediabetes - pt education on lifestyle changes; continue progress with weight loss - Hemoglobin A1c  5. Mixed hyperlipidemia - pt on current lipid lowering therapy, LDL <100, continue current therapy -  Lipid panel  6. Essential hypertension - BP 138/70 at visit; well controlled on current therapy, continue - CBC with Differential/Platelet - Comprehensive metabolic panel with GFR  8. Other fatigue - pt state blue lips on awakening, recent fatigue, consistent with sleep apnea, referral made. - Ambulatory referral to Pulmonology  No follow-ups on file.***  Dale Bound, Student-FNP *** Harlene POUR. Caro BODILY  [facility] [phone] 8 am - 5 pm) [phone] (after hours)     [1]  Allergies Allergen Reactions   Wixela Inhub [Fluticasone -Salmeterol] Shortness Of Breath   Ace Inhibitors     cough   Amoxicillin Itching    REACTION: unknown? pain in right kidney   Benicar Hct [Olmesartan Medoxomil-Hctz]     Extreme weakness   Budesonide -Formoterol  Fumarate     Causes tremors and numbness   Chlorzoxazone Hives   Chlorzoxazone [Chlorzoxazone]    Citalopram Hydrobromide     REACTION: hives   Citalopram Hydrobromide    Clonidine And Derivatives    Cymbalta  [Duloxetine  Hcl]    Droperidol     REACTION: hives   Flexeril [Cyclobenzaprine Hcl]    Fluconazole  Nausea And Vomiting and Other (See Comments)    fatigue   Formoterol  Itching   Gabapentin  Itching   Ketorolac  Tromethamine      REACTION: hives   Ketorolac  Tromethamine     Metoclopramide Hcl    Mometasone  Furo-Formoterol  Fum     Causes sore throat, blurred vision and unable to sleep--started on med 09-17-10   Mometasone  Furoate Itching   Morphine     REACTION: hives and itching   Olmesartan Medoxomil     REACTION: fatique   Breo Ellipta  [Fluticasone  Furoate-Vilanterol] Itching    Pt reports itching with Breo is related to being lactose intolerant.    "

## 2024-07-02 ENCOUNTER — Ambulatory Visit: Admitting: Physician Assistant

## 2024-09-24 ENCOUNTER — Ambulatory Visit: Admitting: Primary Care

## 2024-09-27 ENCOUNTER — Ambulatory Visit: Admitting: Primary Care

## 2024-10-22 ENCOUNTER — Ambulatory Visit: Admitting: Nurse Practitioner

## 2025-02-07 ENCOUNTER — Ambulatory Visit: Payer: Self-pay | Admitting: Nurse Practitioner

## 2025-02-18 ENCOUNTER — Ambulatory Visit: Admitting: Nurse Practitioner
